# Patient Record
Sex: Female | Born: 1945
Health system: Southern US, Community
[De-identification: ages and names within clinical notes are randomized; demographics above are authoritative.]

## PROBLEM LIST (undated history)

## (undated) DIAGNOSIS — M21371 Foot drop, right foot: Secondary | ICD-10-CM

## (undated) DIAGNOSIS — F419 Anxiety disorder, unspecified: Secondary | ICD-10-CM

## (undated) DIAGNOSIS — R2689 Other abnormalities of gait and mobility: Secondary | ICD-10-CM

## (undated) DIAGNOSIS — E039 Hypothyroidism, unspecified: Secondary | ICD-10-CM

## (undated) DIAGNOSIS — J449 Chronic obstructive pulmonary disease, unspecified: Secondary | ICD-10-CM

## (undated) DIAGNOSIS — F32A Depression, unspecified: Secondary | ICD-10-CM

## (undated) DIAGNOSIS — G8929 Other chronic pain: Secondary | ICD-10-CM

## (undated) DIAGNOSIS — R296 Repeated falls: Secondary | ICD-10-CM

## (undated) DIAGNOSIS — M810 Age-related osteoporosis without current pathological fracture: Secondary | ICD-10-CM

## (undated) DIAGNOSIS — Z720 Tobacco use: Secondary | ICD-10-CM

## (undated) DIAGNOSIS — R0902 Hypoxemia: Secondary | ICD-10-CM

## (undated) DIAGNOSIS — M1990 Unspecified osteoarthritis, unspecified site: Secondary | ICD-10-CM

## (undated) DIAGNOSIS — Z79891 Long term (current) use of opiate analgesic: Secondary | ICD-10-CM

## (undated) DIAGNOSIS — R400 Somnolence: Secondary | ICD-10-CM

## (undated) DIAGNOSIS — I10 Essential (primary) hypertension: Secondary | ICD-10-CM

## (undated) DIAGNOSIS — R531 Weakness: Secondary | ICD-10-CM

## (undated) DIAGNOSIS — A498 Other bacterial infections of unspecified site: Secondary | ICD-10-CM

## (undated) DIAGNOSIS — Z221 Carrier of other intestinal infectious diseases: Secondary | ICD-10-CM

## (undated) DIAGNOSIS — I639 Cerebral infarction, unspecified: Secondary | ICD-10-CM

## (undated) DIAGNOSIS — R42 Dizziness and giddiness: Secondary | ICD-10-CM

## (undated) DIAGNOSIS — R0683 Snoring: Secondary | ICD-10-CM

## (undated) DIAGNOSIS — R51 Headache: Secondary | ICD-10-CM

## (undated) HISTORY — DX: Depression, unspecified: F32.A

## (undated) HISTORY — DX: Chronic obstructive pulmonary disease, unspecified: J44.9

## (undated) HISTORY — PX: JOINT REPLACEMENT: SHX530

## (undated) HISTORY — DX: Hypoxemia: R09.02

## (undated) HISTORY — DX: Anxiety disorder, unspecified: F41.9

## (undated) HISTORY — DX: Hypothyroidism, unspecified: E03.9

## (undated) HISTORY — DX: Other chronic pain: G89.29

## (undated) HISTORY — DX: Essential (primary) hypertension: I10

## (undated) HISTORY — DX: Unspecified osteoarthritis, unspecified site: M19.90

## (undated) HISTORY — DX: Age-related osteoporosis without current pathological fracture: M81.0

---

## 1980-07-18 HISTORY — PX: TUBAL LIGATION: SHX77

## 2002-06-28 ENCOUNTER — Emergency Department (HOSPITAL_COMMUNITY): Admission: EM | Admit: 2002-06-28 | Discharge: 2002-06-28 | Payer: Self-pay | Admitting: Emergency Medicine

## 2002-06-28 ENCOUNTER — Encounter: Payer: Self-pay | Admitting: *Deleted

## 2003-05-08 ENCOUNTER — Encounter: Payer: Self-pay | Admitting: Family Medicine

## 2003-05-08 ENCOUNTER — Ambulatory Visit (HOSPITAL_COMMUNITY): Admission: RE | Admit: 2003-05-08 | Discharge: 2003-05-08 | Payer: Self-pay | Admitting: Family Medicine

## 2004-01-08 ENCOUNTER — Emergency Department (HOSPITAL_COMMUNITY): Admission: EM | Admit: 2004-01-08 | Discharge: 2004-01-08 | Payer: Self-pay | Admitting: Emergency Medicine

## 2006-02-11 ENCOUNTER — Emergency Department (HOSPITAL_COMMUNITY): Admission: EM | Admit: 2006-02-11 | Discharge: 2006-02-11 | Payer: Self-pay | Admitting: Emergency Medicine

## 2006-02-24 ENCOUNTER — Emergency Department (HOSPITAL_COMMUNITY): Admission: EM | Admit: 2006-02-24 | Discharge: 2006-02-24 | Payer: Self-pay | Admitting: Emergency Medicine

## 2007-08-14 ENCOUNTER — Ambulatory Visit (HOSPITAL_COMMUNITY): Admission: RE | Admit: 2007-08-14 | Discharge: 2007-08-14 | Payer: Self-pay | Admitting: Family Medicine

## 2008-07-10 ENCOUNTER — Emergency Department (HOSPITAL_COMMUNITY): Admission: EM | Admit: 2008-07-10 | Discharge: 2008-07-10 | Payer: Self-pay | Admitting: Emergency Medicine

## 2008-12-05 ENCOUNTER — Ambulatory Visit (HOSPITAL_COMMUNITY): Admission: RE | Admit: 2008-12-05 | Discharge: 2008-12-05 | Payer: Self-pay | Admitting: Family Medicine

## 2009-04-13 ENCOUNTER — Ambulatory Visit (HOSPITAL_COMMUNITY): Admission: RE | Admit: 2009-04-13 | Discharge: 2009-04-13 | Payer: Self-pay | Admitting: Family Medicine

## 2009-04-22 ENCOUNTER — Ambulatory Visit (HOSPITAL_COMMUNITY): Admission: RE | Admit: 2009-04-22 | Discharge: 2009-04-22 | Payer: Self-pay | Admitting: Family Medicine

## 2009-05-07 ENCOUNTER — Ambulatory Visit (HOSPITAL_COMMUNITY): Admission: RE | Admit: 2009-05-07 | Discharge: 2009-05-07 | Payer: Self-pay | Admitting: Family Medicine

## 2009-07-18 HISTORY — PX: TOTAL KNEE ARTHROPLASTY: SHX125

## 2009-12-31 ENCOUNTER — Emergency Department (HOSPITAL_COMMUNITY): Admission: EM | Admit: 2009-12-31 | Discharge: 2009-12-31 | Payer: Self-pay | Admitting: Emergency Medicine

## 2010-04-14 ENCOUNTER — Ambulatory Visit (HOSPITAL_COMMUNITY): Admission: RE | Admit: 2010-04-14 | Discharge: 2010-04-14 | Payer: Self-pay | Admitting: Family Medicine

## 2010-08-07 ENCOUNTER — Encounter: Payer: Self-pay | Admitting: Family Medicine

## 2010-08-08 ENCOUNTER — Encounter: Payer: Self-pay | Admitting: Family Medicine

## 2010-11-02 ENCOUNTER — Ambulatory Visit (HOSPITAL_COMMUNITY)
Admission: RE | Admit: 2010-11-02 | Discharge: 2010-11-02 | Disposition: A | Discharge status: Home / Self Care | Payer: Medicare Other | Source: Ambulatory Visit | Attending: Orthopedic Surgery | Admitting: Orthopedic Surgery

## 2010-11-02 ENCOUNTER — Other Ambulatory Visit (HOSPITAL_COMMUNITY): Payer: Self-pay | Admitting: Orthopedic Surgery

## 2010-11-02 ENCOUNTER — Encounter (HOSPITAL_COMMUNITY)
Admission: RE | Admit: 2010-11-02 | Discharge: 2010-11-02 | Disposition: A | Discharge status: Home / Self Care | Payer: Medicare Other | Source: Ambulatory Visit | Attending: Orthopedic Surgery | Admitting: Orthopedic Surgery

## 2010-11-02 DIAGNOSIS — Z01812 Encounter for preprocedural laboratory examination: Secondary | ICD-10-CM | POA: Insufficient documentation

## 2010-11-02 DIAGNOSIS — F17200 Nicotine dependence, unspecified, uncomplicated: Secondary | ICD-10-CM | POA: Insufficient documentation

## 2010-11-02 DIAGNOSIS — I10 Essential (primary) hypertension: Secondary | ICD-10-CM | POA: Insufficient documentation

## 2010-11-02 DIAGNOSIS — M1711 Unilateral primary osteoarthritis, right knee: Secondary | ICD-10-CM

## 2010-11-02 DIAGNOSIS — Z01818 Encounter for other preprocedural examination: Secondary | ICD-10-CM | POA: Insufficient documentation

## 2010-11-02 DIAGNOSIS — Z01810 Encounter for preprocedural cardiovascular examination: Secondary | ICD-10-CM | POA: Insufficient documentation

## 2010-11-02 LAB — CBC
Hemoglobin: 15.4 g/dL — ABNORMAL HIGH (ref 12.0–15.0)
MCH: 29.4 pg (ref 26.0–34.0)
MCHC: 33.5 g/dL (ref 30.0–36.0)
Platelets: 275 10*3/uL (ref 150–400)
RBC: 5.23 MIL/uL — ABNORMAL HIGH (ref 3.87–5.11)

## 2010-11-02 LAB — COMPREHENSIVE METABOLIC PANEL
ALT: 21 U/L (ref 0–35)
AST: 20 U/L (ref 0–37)
Albumin: 4 g/dL (ref 3.5–5.2)
Chloride: 105 mEq/L (ref 96–112)
GFR calc Af Amer: >60 mL/min (ref >60)
GFR calc non Af Amer: >60 mL/min (ref >60)
Glucose, Bld: 98 mg/dL (ref 70–99)
Sodium: 139 mEq/L (ref 135–145)
Total Protein: 7.2 g/dL (ref 6.0–8.3)

## 2010-11-02 LAB — SURGICAL PCR SCREEN
MRSA, PCR: NEGATIVE
Staphylococcus aureus: NEGATIVE

## 2010-11-02 LAB — URINE MICROSCOPIC-ADD ON

## 2010-11-02 LAB — URINALYSIS, ROUTINE W REFLEX MICROSCOPIC
Glucose, UA: NEGATIVE mg/dL
Hgb urine dipstick: NEGATIVE
Ketones, ur: NEGATIVE mg/dL
Specific Gravity, Urine: 1.013 (ref 1.005–1.030)

## 2010-11-02 LAB — DIFFERENTIAL
Basophils Absolute: 0 10*3/uL (ref 0.0–0.1)
Basophils Relative: 0 % (ref 0–1)

## 2010-11-03 LAB — URINE CULTURE

## 2010-11-08 ENCOUNTER — Inpatient Hospital Stay (HOSPITAL_COMMUNITY)
Admission: RE | Admit: 2010-11-08 | Discharge: 2010-11-11 | DRG: 470 | Disposition: A | Discharge status: Skilled Nursing Facility | Payer: Medicare Other | Source: Ambulatory Visit | Attending: Orthopedic Surgery | Admitting: Orthopedic Surgery

## 2010-11-08 DIAGNOSIS — J449 Chronic obstructive pulmonary disease, unspecified: Secondary | ICD-10-CM | POA: Diagnosis present

## 2010-11-08 DIAGNOSIS — X58XXXA Exposure to other specified factors, initial encounter: Secondary | ICD-10-CM | POA: Diagnosis not present

## 2010-11-08 DIAGNOSIS — J4489 Other specified chronic obstructive pulmonary disease: Secondary | ICD-10-CM | POA: Diagnosis present

## 2010-11-08 DIAGNOSIS — M1710 Unilateral primary osteoarthritis, unspecified knee: Principal | ICD-10-CM | POA: Diagnosis present

## 2010-11-08 DIAGNOSIS — IMO0002 Reserved for concepts with insufficient information to code with codable children: Principal | ICD-10-CM | POA: Diagnosis present

## 2010-11-08 DIAGNOSIS — E079 Disorder of thyroid, unspecified: Secondary | ICD-10-CM | POA: Diagnosis present

## 2010-11-08 DIAGNOSIS — L259 Unspecified contact dermatitis, unspecified cause: Secondary | ICD-10-CM | POA: Diagnosis not present

## 2010-11-08 DIAGNOSIS — F17200 Nicotine dependence, unspecified, uncomplicated: Secondary | ICD-10-CM | POA: Diagnosis present

## 2010-11-08 DIAGNOSIS — D62 Acute posthemorrhagic anemia: Secondary | ICD-10-CM | POA: Diagnosis not present

## 2010-11-08 DIAGNOSIS — F411 Generalized anxiety disorder: Secondary | ICD-10-CM | POA: Diagnosis present

## 2010-11-08 DIAGNOSIS — K219 Gastro-esophageal reflux disease without esophagitis: Secondary | ICD-10-CM | POA: Diagnosis present

## 2010-11-08 LAB — TYPE AND SCREEN: Antibody Screen: NEGATIVE

## 2010-11-08 LAB — ABO/RH: ABO/RH(D): A NEG

## 2010-11-09 LAB — BASIC METABOLIC PANEL
CO2: 27 mEq/L (ref 19–32)
Calcium: 8.4 mg/dL (ref 8.4–10.5)
GFR calc Af Amer: >60 mL/min (ref >60)
GFR calc non Af Amer: >60 mL/min (ref >60)
Sodium: 138 mEq/L (ref 135–145)

## 2010-11-10 LAB — CBC
Hemoglobin: 10.2 g/dL — ABNORMAL LOW (ref 12.0–15.0)
RBC: 3.51 MIL/uL — ABNORMAL LOW (ref 3.87–5.11)
WBC: 9.1 10*3/uL (ref 4.0–10.5)

## 2010-11-10 LAB — BASIC METABOLIC PANEL
CO2: 27 mEq/L (ref 19–32)
Chloride: 103 mEq/L (ref 96–112)
GFR calc Af Amer: >60 mL/min (ref >60)
Sodium: 138 mEq/L (ref 135–145)

## 2010-11-11 LAB — BASIC METABOLIC PANEL
BUN: 4 mg/dL — ABNORMAL LOW (ref 6–23)
CO2: 25 mEq/L (ref 19–32)
Chloride: 105 mEq/L (ref 96–112)
Creatinine, Ser: 0.52 mg/dL (ref 0.4–1.2)
GFR calc Af Amer: >60 mL/min (ref >60)
Potassium: 3.7 mEq/L (ref 3.5–5.1)

## 2010-11-11 LAB — CBC
Hemoglobin: 10 g/dL — ABNORMAL LOW (ref 12.0–15.0)
MCH: 29.2 pg (ref 26.0–34.0)
MCV: 87.7 fL (ref 78.0–100.0)
RBC: 3.42 MIL/uL — ABNORMAL LOW (ref 3.87–5.11)
WBC: 7.9 10*3/uL (ref 4.0–10.5)

## 2010-11-16 ENCOUNTER — Other Ambulatory Visit (HOSPITAL_COMMUNITY): Payer: Self-pay

## 2010-12-07 NOTE — Op Note (Signed)
NAMENAKSHATRA, KLOSE              ACCOUNT NO.:  [id number]  MEDICAL RECORD NO.:  [id number]           PATIENT TYPE:  I  LOCATION:  5009                         FACILITY:  [facility]  PHYSICIAN:  Oda Lansdowne A. Thurston Hole, M.D. DATE OF BIRTH:  [birth year]  DATE OF PROCEDURE:  11/08/2010 DATE OF DISCHARGE:                              OPERATIVE REPORT   PREOPERATIVE DIAGNOSIS:  Right knee degenerative joint disease.  POSTOPERATIVE DIAGNOSIS:  Right knee degenerative joint disease.  PROCEDURE: 1. Right total knee replacement using DePuy cemented total knee system     with #2 cemented femur, #2 cemented tibia with 10 mm polyethylene     RP tibial spacer, and 32-mm polyethylene cemented patella. 2. Zinacef-impregnated cement.  SURGEON:  Elana Alm. Thurston Hole, MD  ASSISTANT:  Julien Girt, PA-C  ANESTHESIA:  General.  OPERATIVE TIME:  1 hour 20 minutes.  COMPLICATIONS:  None.  DESCRIPTION OF PROCEDURE:  Ms. Carrozza was brought to the operating room on November 08, 2010, after a femoral nerve block was placed in holding by Anesthesia.  She was placed on the operative table in supine position. After being placed under general anesthesia, her right knee was examined.  Range of motions from -5 to 125 degrees, moderate varus deformity, and a stable ligamentous exam with normal patellar tracking. She had a Foley catheter placed under sterile conditions.  She received Ancef 1 g IV preoperatively for prophylaxis.  Right leg was then prepped using sterile DuraPrep and draped using sterile technique.  Time-out procedure was called, and the correct right knee identified.  The right leg was exsanguinated, and a thigh tourniquet elevated 365 mm. Initially through a 12-cm longitudinal incision based over the patella, initial exposure was made.  The underlying subcutaneous tissues were incised along with the skin incision.  A median arthrotomy was performed revealing an excessive amount of normal-appearing  joint fluid.  The articular surfaces were inspected.  She had grade 4 changes medially, laterally, and in the patellofemoral joint.  Osteophytes were removed from the femoral condyles and tibial plateau.  The medial and lateral meniscal remnants were removed as well as the anterior cruciate ligament.  Intramedullary drill was then drilled down the femoral canal for placement of the distal femoral cutting jig, which was placed in the appropriate manner of rotation.  A distal 11-mm cut was made.  The distal femur was incised.  #2 was found be appropriate size.  #2 cutting jig was placed in the appropriate manner of external rotation and then these cuts were made.  The proximal tibia was then exposed.  The tibial spines were removed with an oscillating saw.  Intramedullary drill was drilled down the tibial canal for placement of proximal tibial cutting jig, which was placed in the appropriate manner of rotation, and a proximal 4-mm cut was made based off the medial or lower side.  Spacer blocks were then placed in flexion extension.  10-mm blocks gave excellent balancing, excellent stability, and excellent correction of her flexion and varus deformities.  At this point, the #2 tibial baseplate trial was placed on the cut tibial surface with an excellent fit and the  keel cut was made.  The PCL box cutter was then placed on the distal femur, and these cuts were made.  At this point, the #2 femoral trial was placed with a #2 tibial baseplate trial and a 10-mm polyethylene RP tibial spacer, knee was reduced, taken through range of motion from 0-125 degrees with excellent stability, and excellent correction of her flexion and varus deformities and normal patellar tracking.  At this point, a resurfacing 8.5-mm cut was made on the patella and three locking holes placed for a 32-mm polyethylene patellar trial.  Again, patellofemoral tracking was evaluated and found to be normal.  At this point, it  was felt that all the components were of excellent size, fit, and stability.  They were then removed.  The knee was then jet lavaged, irrigated with 3 liters of saline.  Proximal tibia was then exposed, and #2 tibial component with Zinacef-impregnated cement backing was hammered into position with an excellent fit with excess cement being removed around the edges.  #2 femoral component with cement backing was hammered into position also with an excellent fit with excess cement being removed from around the edges.  The 10-mm polyethylene RP tibial spacer was placed on the tibial baseplate.  The knee reduced, taken through range of motion from 0-125 degrees with excellent stability and excellent correction of her flexion and varus deformities.  The 32-mm polyethylene cement backed patella was then placed in its position and held there with a clamp.  After the cement hardened again, patellofemoral tracking was evaluated and found to be normal.  At this point, it was felt that all the components were of excellent size, fit, and stability.  The wound was further irrigated with saline and then the tourniquet was released.  Hemostasis obtained with cautery.  The arthrotomy was then closed with #1 Ethibond suture over two medium Hemovac drains.  Subcutaneous tissues were closed 0 and 2-0 Vicryl, subcuticular layer closed with 4-0 Monocryl.  Sterile dressings and a long leg splint applied.  The patient then awakened, extubated, and taken to recovery room in stable condition.  Needle and sponge counts were correct x2 at the end of the case.  Neurovascular status normal.  Pulses 2+ and symmetric.     Sarath Privott A. Thurston Hole, M.D.     RAW/MEDQ  D:  11/08/2010  T:  11/09/2010  Job:  [id number]  Electronically Signed by Salvatore Marvel M.D. on 12/07/2010 05:20:10 PM

## 2010-12-07 NOTE — Discharge Summary (Signed)
Amanda Armstrong, Amanda Armstrong              ACCOUNT NO.:  [id number]  MEDICAL RECORD NO.:  [id number]           PATIENT TYPE:  I  LOCATION:  5009                         FACILITY:  [facility]  PHYSICIAN:  Taariq Leitz A. Thurston Hole, M.D. DATE OF BIRTH:  [birth year]  DATE OF ADMISSION:  11/08/2010 DATE OF DISCHARGE:  11/11/2010                        DISCHARGE SUMMARY - REFERRING   ADMITTING DIAGNOSES: 1. End-stage DJD, right knee. 2. Thyroid disease. 3. Tobacco abuse. 4. Anxiety.  DISCHARGE DIAGNOSES: 1. End-stage DJD, right knee. 2. Acute blood loss anemia. 3. Contact dermatitis from her TED hose. 4. Full-thickness skin blister from adhesive tape. 5. Thyroid disease. 6. Tobacco abuse. 7. Anxiety  HISTORY OF PRESENT ILLNESS:  The patient is a 65 year old white female with a history of end-stage DJD of her right knee.  She failed conservative care including anti-inflammatories and interarticular cortisone injections.  Risks, benefits and possible complications of a right total knee replacement had been discussed in detail with the patient.  She is without question.  PROCEDURES IN-HOUSE:  On November 08, 2010, the patient underwent a right total knee replacement by Dr. Thurston Hole.  A right femoral nerve block by anesthesia and Autovac transfusion.  She tolerated all of these procedures well.  HOSPITAL COURSE:  Postoperatively, the patient was placed in her CPM 0- 90 degrees.  In the recovery room, she also received her Autovac transfusions in the recovery room.  She tolerated this well.  Postop day #1, she was metabolically stable.  Surgical wound was well approximated. She was very slow to progress with physical therapy, pivoting from the bed to the chair.  She was given milk of magnesia, fluid boluses.  Her dressing was changed.  Her IV was saline locked.  Postop day #2, the patient continued to progress with a max walking of 15 feet.  She fatigued easily, leg gave way regularly.  Hemoglobin is  stable at 10.2. She is metabolically stable within normal limits.  She is afebrile. Blood pressure was stable at 127/72.  Postop day #3, the patient is still very, very slow to progress.  She ideally would like to be discharged to home.  However, for her safety, we do feel it is medically necessary for her to be discharged to a skilled facility.  She gets still 1+ to 2 assist with transfers and still ambulating approximately 15 feet.  She had a bowel movement yesterday.  Surgical wound is well approximated.  She does have Tegaderm onto proximal tape blisters on her thigh, and she did have some contact dermatitis from her TED hose.  This will need to be watched closely while she is at skilled nursing.  She will need physical therapy daily, occupational therapy.  She will need to use her CPM 0-90 degrees 6 hours a day.  She will need to have her right heel elevated on a folded pillow for 30 minutes every day to work on extension of her knee.  She needs her dressing changed every day. She needs to have her right knee washed with soap and water every single day, rinsed thoroughly.  Please do not use any waterless soap but make sure use  regular soap and water and wash her whole leg thoroughly every single day.  A dry dressing needs to be applied to the wound.  Please call our office [phone] to make a followup appointment for Nov 22, 2010. Call our office with increased pain, increased redness, increased swelling or temperature greater than 101.  DISCHARGE MEDICATIONS: 1. Colace 100 mg 1 tablet twice a day. 2. Lovenox 30 mg 1 injection subcu twice a day until Nov 22, 2010. 3. Hydrocodone 10/325 one to two tablets every 4 hours as needed for     pain. 4. Protonix 40 mg 1 tablet daily. 5. Xanax 1 mg by mouth at night. 6. Levothyroxine 100 mcg 1 tablet daily.  We have currently stopped her home medication of hydrocodone 5 and change this out for hydrocodone 10 due to her postoperative pain.   She does not tolerate oxycodone due to mental status changes.  She does not tolerate Sudafed also due to mental status changes.  She will follow up in our office on Nov 22, 2010, till than she is on a regular diet, weightbearing as tolerated.     Amanda Shepperson, PA-C   ______________________________ Elana Alm Thurston Hole, M.D.    KS/MEDQ  D:  11/11/2010  T:  11/11/2010  Job:  [id number]  Electronically Signed by Julien Girt P.A. on 12/01/2010 09:53:11 AM Electronically Signed by Salvatore Marvel M.D. on 12/07/2010 05:20:30 PM

## 2011-06-06 ENCOUNTER — Other Ambulatory Visit (HOSPITAL_COMMUNITY): Payer: Self-pay | Admitting: Family Medicine

## 2011-06-06 DIAGNOSIS — Z139 Encounter for screening, unspecified: Secondary | ICD-10-CM

## 2011-06-06 DIAGNOSIS — G8929 Other chronic pain: Secondary | ICD-10-CM

## 2011-06-10 ENCOUNTER — Ambulatory Visit (HOSPITAL_COMMUNITY)
Admission: RE | Admit: 2011-06-10 | Discharge: 2011-06-10 | Disposition: A | Discharge status: Home / Self Care | Payer: Medicare Other | Source: Ambulatory Visit | Attending: Family Medicine | Admitting: Family Medicine

## 2011-06-10 DIAGNOSIS — M818 Other osteoporosis without current pathological fracture: Secondary | ICD-10-CM | POA: Insufficient documentation

## 2011-06-10 DIAGNOSIS — Z1231 Encounter for screening mammogram for malignant neoplasm of breast: Secondary | ICD-10-CM | POA: Insufficient documentation

## 2011-06-10 DIAGNOSIS — G8929 Other chronic pain: Secondary | ICD-10-CM

## 2011-06-10 DIAGNOSIS — Z780 Asymptomatic menopausal state: Secondary | ICD-10-CM | POA: Insufficient documentation

## 2011-06-10 DIAGNOSIS — Z139 Encounter for screening, unspecified: Secondary | ICD-10-CM

## 2011-10-24 ENCOUNTER — Encounter (HOSPITAL_COMMUNITY): Payer: Self-pay | Admitting: Pharmacy Technician

## 2011-10-25 ENCOUNTER — Encounter: Payer: Self-pay | Admitting: Physician Assistant

## 2011-10-25 ENCOUNTER — Other Ambulatory Visit: Payer: Self-pay | Admitting: Physician Assistant

## 2011-10-25 DIAGNOSIS — M1990 Unspecified osteoarthritis, unspecified site: Secondary | ICD-10-CM | POA: Insufficient documentation

## 2011-10-25 DIAGNOSIS — E039 Hypothyroidism, unspecified: Secondary | ICD-10-CM

## 2011-10-25 DIAGNOSIS — F419 Anxiety disorder, unspecified: Secondary | ICD-10-CM

## 2011-10-25 NOTE — H&P (Signed)
Amanda Armstrong is an 66 y.o. female.   Chief Complaint: left knee DJD HPI: Amanda Armstrong is a 66 year old seen for follow-up from her right total knee replacement which is 9 months post-op doing well. She has significant persistent left knee pain with known degenerative joint disease. She has pain with weightbearing and activity relieved by rest. The excruciating pain is getting progressively worse, limiting activities of daily living, poses a significant fall risk, and has failed multiple conservative treatments including intraarticular cortisone injections, intraarticular Supartz, bracing, medication and home physical therapy.  Past Medical History  Diagnosis Date  . Hypothyroidism   . Anxiety   . Arthritis     Bilateral Knee DJD    Past Surgical History  Procedure Date  . Joint replacement 11/08/3010    right total knee  . Tubal ligation 1982    Family History  Problem Relation Age of Onset  . Heart attack Mother   . COPD Father   . Arthritis Sister   . Heart disease Brother    Social History:  reports that she has been smoking.  She does not have any smokeless tobacco history on file. She reports that she does not drink alcohol or use illicit drugs.  Allergies: No Known Allergies  Medications Prior to Admission  Medication Sig Dispense Refill  . ALPRAZolam (XANAX) 1 MG tablet Take 1 mg by mouth 3 (three) times daily as needed. For anxiety      . levothyroxine (SYNTHROID, LEVOTHROID) 100 MCG tablet Take 100 mcg by mouth daily.      . naproxen sodium (ANAPROX) 220 MG tablet Take 440 mg by mouth 2 (two) times daily with a meal.      . Fexofenadine HCl (EQ ALLERGY RELIEF PO) Take 1 tablet by mouth 2 (two) times daily as needed. For sinuses       No current facility-administered medications on file as of 10/25/2011.    No results found for this or any previous visit (from the past 48 hour(s)). No results found.  Review of Systems  Constitutional: Negative for fever, chills,  weight loss, malaise/fatigue and diaphoresis.  HENT: Negative for hearing loss, ear pain, nosebleeds, congestion, sore throat, neck pain, tinnitus and ear discharge.   Eyes: Negative for blurred vision, double vision, photophobia, pain, discharge and redness.  Respiratory: Negative for cough, hemoptysis, sputum production, shortness of breath, wheezing and stridor.   Cardiovascular: Negative for chest pain, palpitations, orthopnea, claudication, leg swelling and PND.  Gastrointestinal: Negative for heartburn, nausea, vomiting, abdominal pain, diarrhea, constipation, blood in stool and melena.  Genitourinary: Negative for dysuria, urgency, frequency, hematuria and flank pain.  Musculoskeletal: Positive for joint pain. Negative for myalgias, back pain and falls.       Left knee pain  Skin: Negative for itching and rash.  Neurological: Negative for dizziness, tingling, tremors, sensory change, speech change, focal weakness, seizures, loss of consciousness, weakness and headaches.  Endo/Heme/Allergies: Negative for environmental allergies and polydipsia. Does not bruise/bleed easily.  Psychiatric/Behavioral: Negative for depression, suicidal ideas, hallucinations, memory loss and substance abuse. The patient is nervous/anxious. The patient does not have insomnia.     Blood pressure 136/76, pulse 76, temperature 97.9 F (36.6 C), height 4' 11" (1.499 m), weight 60.328 kg (133 lb), SpO2 95.00%. Physical Exam  Constitutional: She is oriented to person, place, and time. She appears well-developed and well-nourished.  HENT:  Head: Normocephalic and atraumatic.  Mouth/Throat: Oropharynx is clear and moist.  Eyes: Conjunctivae are normal. Pupils   are equal, round, and reactive to light.  Neck: Normal range of motion. Neck supple.  Cardiovascular: Normal rate, regular rhythm and normal heart sounds.   Respiratory: Effort normal and breath sounds normal.  GI: Bowel sounds are normal.  Genitourinary:        Not pertinent to current symptomatology therefore not examined.  Musculoskeletal:       Examination of her right knee reveals well healed total knee incision without swelling or pain range of motion 0-120 degrees knee is stable with normal patella tracking. Exam of her left knee reveals 1 to 2+ crepitation 1+ synovitis range of motion 0-120 degrees knee is stable with normal patella tracking. Vascular exam: pulses 2+ and symmetric.  Neurological: She is alert and oriented to person, place, and time.  Skin: Skin is warm and dry.  Psychiatric: She has a normal mood and affect. Her behavior is normal. Judgment and thought content normal.     Assessment Past Medical History  Diagnosis Date  . Hypothyroidism   . Anxiety   . Arthritis     Bilateral Knee DJD    Plan I talk to her and her husband about this in detail. She wants to proceed with left total knee replacement. Discussed risks benefits and possible complications of the surgery in detail and she understands this completely.   Amanda Armstrong J 10/25/2011, 3:39 PM    

## 2011-11-01 ENCOUNTER — Encounter (HOSPITAL_COMMUNITY): Payer: Self-pay

## 2011-11-01 ENCOUNTER — Encounter (HOSPITAL_COMMUNITY)
Admission: RE | Admit: 2011-11-01 | Discharge: 2011-11-01 | Disposition: A | Discharge status: Home / Self Care | Payer: Medicare Other | Source: Ambulatory Visit | Attending: Orthopedic Surgery | Admitting: Orthopedic Surgery

## 2011-11-01 ENCOUNTER — Encounter (HOSPITAL_COMMUNITY)
Admission: RE | Admit: 2011-11-01 | Discharge: 2011-11-01 | Disposition: A | Discharge status: Home / Self Care | Payer: Medicare Other | Source: Ambulatory Visit | Attending: Physician Assistant | Admitting: Physician Assistant

## 2011-11-01 HISTORY — DX: Headache: R51

## 2011-11-01 LAB — COMPREHENSIVE METABOLIC PANEL
ALT: 10 U/L (ref 0–35)
CO2: 28 mEq/L (ref 19–32)
Calcium: 9.2 mg/dL (ref 8.4–10.5)
Creatinine, Ser: 0.63 mg/dL (ref 0.50–1.10)
GFR calc Af Amer: >90 mL/min (ref >90)
GFR calc non Af Amer: >90 mL/min (ref >90)
Glucose, Bld: 101 mg/dL — ABNORMAL HIGH (ref 70–99)
Sodium: 141 mEq/L (ref 135–145)
Total Protein: 7.1 g/dL (ref 6.0–8.3)

## 2011-11-01 LAB — PROTIME-INR
INR: 1.05 (ref 0.00–1.49)
Prothrombin Time: 13.9 seconds (ref 11.6–15.2)

## 2011-11-01 LAB — CBC
HCT: 44.4 % (ref 36.0–46.0)
MCH: 30.8 pg (ref 26.0–34.0)
MCHC: 34 g/dL (ref 30.0–36.0)
MCV: 90.4 fL (ref 78.0–100.0)
Platelets: 253 10*3/uL (ref 150–400)
RBC: 4.91 MIL/uL (ref 3.87–5.11)
WBC: 8.6 10*3/uL (ref 4.0–10.5)

## 2011-11-01 LAB — DIFFERENTIAL
Basophils Absolute: 0.1 10*3/uL (ref 0.0–0.1)
Eosinophils Relative: 1 % (ref 0–5)
Lymphocytes Relative: 33 % (ref 12–46)
Lymphs Abs: 2.8 10*3/uL (ref 0.7–4.0)
Neutro Abs: 5 10*3/uL (ref 1.7–7.7)

## 2011-11-01 LAB — TYPE AND SCREEN
ABO/RH(D): A NEG
Antibody Screen: NEGATIVE

## 2011-11-01 LAB — URINALYSIS, ROUTINE W REFLEX MICROSCOPIC
Bilirubin Urine: NEGATIVE
Hgb urine dipstick: NEGATIVE
Ketones, ur: NEGATIVE mg/dL
Nitrite: NEGATIVE
Protein, ur: NEGATIVE mg/dL
Urobilinogen, UA: 1 mg/dL (ref 0.0–1.0)

## 2011-11-01 LAB — URINE MICROSCOPIC-ADD ON

## 2011-11-01 LAB — APTT: aPTT: 50 seconds — ABNORMAL HIGH (ref 24–37)

## 2011-11-01 LAB — SURGICAL PCR SCREEN
MRSA, PCR: NEGATIVE
Staphylococcus aureus: NEGATIVE

## 2011-11-01 MED ORDER — CHLORHEXIDINE GLUCONATE 4 % EX LIQD: 60.0000 mL | Freq: Once | CUTANEOUS | Status: DC

## 2011-11-01 NOTE — Pre-Procedure Instructions (Signed)
20 Amanda Armstrong  11/01/2011   Your procedure is scheduled on:  November 07, 2011  Report to [facility] at 7:00 AM.  Call this number if you have problems the morning of surgery: [phone]   Remember:   Do not eat food:After Midnight.  May have clear liquids: up to 4 Hours before arrival.  Clear liquids include soda, tea, black coffee, apple or grape juice, broth.  Take these medicines the morning of surgery with A SIP OF WATER: XANAX,SYNTHROID   Do not wear jewelry, make-up or nail polish.  Do not wear lotions, powders, or perfumes. You may wear deodorant.  Do not shave 48 hours prior to surgery.  Do not bring valuables to the hospital.  Contacts, dentures or bridgework may not be worn into surgery.  Leave suitcase in the car. After surgery it may be brought to your room.  For patients admitted to the hospital, checkout time is 11:00 AM the day of discharge.   Patients discharged the day of surgery will not be allowed to drive home.  Name and phone number of your driver: NA  Special Instructions: Incentive Spirometry - Practice and bring it with you on the day of surgery. and CHG Shower Use Special Wash: 1/2 bottle night before surgery and 1/2 bottle morning of surgery.   Please read over the following fact sheets that you were given: Pain Booklet, Blood Transfusion Information, Total Joint Packet, MRSA Information and Surgical Site Infection Prevention

## 2011-11-02 LAB — URINE CULTURE

## 2011-11-06 MED ORDER — CEFAZOLIN SODIUM 1-5 GM-% IV SOLN
1.0000 g | INTRAVENOUS | Status: AC
  Administered 2011-11-07: 1 g via INTRAVENOUS
  Filled 2011-11-06: qty 50

## 2011-11-06 MED ORDER — LACTATED RINGERS IV SOLN
INTRAVENOUS | Status: DC
  Administered 2011-11-07 (×2): via INTRAVENOUS

## 2011-11-06 MED ORDER — POVIDONE-IODINE 7.5 % EX SOLN
Freq: Once | CUTANEOUS | Status: DC
  Filled 2011-11-06: qty 118

## 2011-11-07 ENCOUNTER — Encounter (HOSPITAL_COMMUNITY): Payer: Self-pay | Admitting: Anesthesiology

## 2011-11-07 ENCOUNTER — Encounter (HOSPITAL_COMMUNITY): Payer: Self-pay | Admitting: Surgery

## 2011-11-07 ENCOUNTER — Encounter (HOSPITAL_COMMUNITY): Payer: Self-pay | Admitting: Vascular Surgery

## 2011-11-07 ENCOUNTER — Inpatient Hospital Stay (HOSPITAL_COMMUNITY)
Admission: RE | Admit: 2011-11-07 | Discharge: 2011-11-10 | DRG: 470 | Disposition: A | Discharge status: Skilled Nursing Facility | Payer: Medicare Other | Source: Ambulatory Visit | Attending: Orthopedic Surgery | Admitting: Orthopedic Surgery

## 2011-11-07 ENCOUNTER — Ambulatory Visit (HOSPITAL_COMMUNITY): Payer: Medicare Other | Admitting: Anesthesiology

## 2011-11-07 ENCOUNTER — Encounter (HOSPITAL_COMMUNITY): Payer: Self-pay | Admitting: *Deleted

## 2011-11-07 ENCOUNTER — Encounter (HOSPITAL_COMMUNITY)
Admission: RE | Disposition: A | Discharge status: Skilled Nursing Facility | Payer: Self-pay | Source: Ambulatory Visit | Attending: Orthopedic Surgery

## 2011-11-07 DIAGNOSIS — F411 Generalized anxiety disorder: Secondary | ICD-10-CM | POA: Diagnosis present

## 2011-11-07 DIAGNOSIS — J449 Chronic obstructive pulmonary disease, unspecified: Secondary | ICD-10-CM | POA: Diagnosis present

## 2011-11-07 DIAGNOSIS — Z96659 Presence of unspecified artificial knee joint: Secondary | ICD-10-CM

## 2011-11-07 DIAGNOSIS — F419 Anxiety disorder, unspecified: Secondary | ICD-10-CM | POA: Diagnosis present

## 2011-11-07 DIAGNOSIS — M1990 Unspecified osteoarthritis, unspecified site: Secondary | ICD-10-CM | POA: Diagnosis present

## 2011-11-07 DIAGNOSIS — M1710 Unilateral primary osteoarthritis, unspecified knee: Principal | ICD-10-CM | POA: Diagnosis present

## 2011-11-07 DIAGNOSIS — E039 Hypothyroidism, unspecified: Secondary | ICD-10-CM | POA: Diagnosis present

## 2011-11-07 DIAGNOSIS — Z01811 Encounter for preprocedural respiratory examination: Secondary | ICD-10-CM

## 2011-11-07 DIAGNOSIS — Z79899 Other long term (current) drug therapy: Secondary | ICD-10-CM

## 2011-11-07 DIAGNOSIS — J4489 Other specified chronic obstructive pulmonary disease: Secondary | ICD-10-CM | POA: Diagnosis present

## 2011-11-07 DIAGNOSIS — Z01810 Encounter for preprocedural cardiovascular examination: Secondary | ICD-10-CM

## 2011-11-07 DIAGNOSIS — Z01812 Encounter for preprocedural laboratory examination: Secondary | ICD-10-CM

## 2011-11-07 DIAGNOSIS — F17200 Nicotine dependence, unspecified, uncomplicated: Secondary | ICD-10-CM | POA: Diagnosis present

## 2011-11-07 DIAGNOSIS — Z01818 Encounter for other preprocedural examination: Secondary | ICD-10-CM

## 2011-11-07 HISTORY — PX: TOTAL KNEE ARTHROPLASTY: SHX125

## 2011-11-07 SURGERY — ARTHROPLASTY, KNEE, TOTAL
Anesthesia: General | Site: Knee | Laterality: Left | Wound class: Clean

## 2011-11-07 MED ORDER — ONDANSETRON HCL 4 MG/2ML IJ SOLN: 4.0000 mg | Freq: Four times a day (QID) | INTRAMUSCULAR | Status: DC | PRN

## 2011-11-07 MED ORDER — ONDANSETRON HCL 4 MG/2ML IJ SOLN
INTRAMUSCULAR | Status: DC | PRN
  Administered 2011-11-07: 4 mg via INTRAVENOUS

## 2011-11-07 MED ORDER — FEXOFENADINE HCL 180 MG PO TABS
180.0000 mg | ORAL_TABLET | Freq: Every day | ORAL | Status: DC
  Administered 2011-11-07 – 2011-11-10 (×4): 180 mg via ORAL
  Filled 2011-11-07 (×4): qty 1

## 2011-11-07 MED ORDER — HYDROCODONE-ACETAMINOPHEN 10-325 MG PO TABS
1.0000 | ORAL_TABLET | ORAL | Status: DC | PRN
  Administered 2011-11-07 – 2011-11-09 (×4): 2 via ORAL
  Administered 2011-11-09: 1 via ORAL
  Administered 2011-11-09 – 2011-11-10 (×2): 2 via ORAL
  Filled 2011-11-07 (×4): qty 2
  Filled 2011-11-07: qty 1
  Filled 2011-11-07 (×3): qty 2
  Filled 2011-11-07: qty 1
  Filled 2011-11-07: qty 2

## 2011-11-07 MED ORDER — CEFAZOLIN SODIUM 1-5 GM-% IV SOLN
1.0000 g | Freq: Four times a day (QID) | INTRAVENOUS | Status: AC
  Administered 2011-11-07 – 2011-11-08 (×3): 1 g via INTRAVENOUS
  Filled 2011-11-07 (×3): qty 50

## 2011-11-07 MED ORDER — NEOSTIGMINE METHYLSULFATE 1 MG/ML IJ SOLN
INTRAMUSCULAR | Status: DC | PRN
  Administered 2011-11-07: 2 mg via INTRAVENOUS
  Administered 2011-11-07: 1 mg via INTRAVENOUS

## 2011-11-07 MED ORDER — LEVOTHYROXINE SODIUM 100 MCG PO TABS
100.0000 ug | ORAL_TABLET | Freq: Every day | ORAL | Status: DC
  Administered 2011-11-08 – 2011-11-10 (×3): 100 ug via ORAL
  Filled 2011-11-07 (×4): qty 1

## 2011-11-07 MED ORDER — HYDROMORPHONE HCL PF 1 MG/ML IJ SOLN
0.5000 mg | INTRAMUSCULAR | Status: DC | PRN
  Administered 2011-11-07 – 2011-11-08 (×2): 1 mg via INTRAVENOUS
  Filled 2011-11-07 (×2): qty 1

## 2011-11-07 MED ORDER — CELECOXIB 200 MG PO CAPS
200.0000 mg | ORAL_CAPSULE | Freq: Two times a day (BID) | ORAL | Status: DC
  Administered 2011-11-07 – 2011-11-10 (×6): 200 mg via ORAL
  Filled 2011-11-07 (×7): qty 1

## 2011-11-07 MED ORDER — CEFUROXIME SODIUM 1.5 G IJ SOLR
INTRAMUSCULAR | Status: DC | PRN
  Administered 2011-11-07: 1.5 g

## 2011-11-07 MED ORDER — MENTHOL 3 MG MT LOZG: 1.0000 | LOZENGE | OROMUCOSAL | Status: DC | PRN

## 2011-11-07 MED ORDER — LIDOCAINE HCL (CARDIAC) 20 MG/ML IV SOLN
INTRAVENOUS | Status: DC | PRN
  Administered 2011-11-07: 40 mg via INTRAVENOUS

## 2011-11-07 MED ORDER — FENTANYL CITRATE 0.05 MG/ML IJ SOLN
50.0000 ug | Freq: Once | INTRAMUSCULAR | Status: AC
  Administered 2011-11-07: 50 ug via INTRAVENOUS

## 2011-11-07 MED ORDER — ALBUTEROL SULFATE HFA 108 (90 BASE) MCG/ACT IN AERS
INHALATION_SPRAY | RESPIRATORY_TRACT | Status: DC | PRN
  Administered 2011-11-07 (×2): 2 via RESPIRATORY_TRACT

## 2011-11-07 MED ORDER — BUPIVACAINE-EPINEPHRINE PF 0.5-1:200000 % IJ SOLN
INTRAMUSCULAR | Status: DC | PRN
  Administered 2011-11-07: 20 mL

## 2011-11-07 MED ORDER — POTASSIUM CHLORIDE IN NACL 20-0.9 MEQ/L-% IV SOLN
INTRAVENOUS | Status: DC
  Administered 2011-11-07 – 2011-11-08 (×2): via INTRAVENOUS
  Filled 2011-11-07 (×9): qty 1000

## 2011-11-07 MED ORDER — METOCLOPRAMIDE HCL 5 MG/ML IJ SOLN: 5.0000 mg | Freq: Three times a day (TID) | INTRAMUSCULAR | Status: DC | PRN

## 2011-11-07 MED ORDER — PROPOFOL 10 MG/ML IV BOLUS
INTRAVENOUS | Status: DC | PRN
  Administered 2011-11-07: 120 mg via INTRAVENOUS

## 2011-11-07 MED ORDER — FENTANYL CITRATE 0.05 MG/ML IJ SOLN
INTRAMUSCULAR | Status: AC
  Filled 2011-11-07: qty 2

## 2011-11-07 MED ORDER — DEXAMETHASONE SODIUM PHOSPHATE 4 MG/ML IJ SOLN
INTRAMUSCULAR | Status: DC | PRN
  Administered 2011-11-07: 4 mg via INTRAVENOUS

## 2011-11-07 MED ORDER — SODIUM CHLORIDE 0.9 % IR SOLN
Status: DC | PRN
  Administered 2011-11-07: 3000 mL
  Administered 2011-11-07: 1000 mL

## 2011-11-07 MED ORDER — ALPRAZOLAM 0.5 MG PO TABS
1.0000 mg | ORAL_TABLET | Freq: Three times a day (TID) | ORAL | Status: DC | PRN
  Administered 2011-11-07 – 2011-11-08 (×3): 1 mg via ORAL
  Filled 2011-11-07: qty 1
  Filled 2011-11-07 (×2): qty 2
  Filled 2011-11-07: qty 1

## 2011-11-07 MED ORDER — DOCUSATE SODIUM 100 MG PO CAPS
100.0000 mg | ORAL_CAPSULE | Freq: Two times a day (BID) | ORAL | Status: DC
  Administered 2011-11-07 – 2011-11-10 (×6): 100 mg via ORAL
  Filled 2011-11-07 (×8): qty 1

## 2011-11-07 MED ORDER — METOCLOPRAMIDE HCL 10 MG PO TABS: 5.0000 mg | ORAL_TABLET | Freq: Three times a day (TID) | ORAL | Status: DC | PRN

## 2011-11-07 MED ORDER — GLYCOPYRROLATE 0.2 MG/ML IJ SOLN
INTRAMUSCULAR | Status: DC | PRN
  Administered 2011-11-07 (×2): .2 mg via INTRAVENOUS

## 2011-11-07 MED ORDER — ENOXAPARIN SODIUM 30 MG/0.3ML ~~LOC~~ SOLN
30.0000 mg | Freq: Two times a day (BID) | SUBCUTANEOUS | Status: DC
Start: 2011-11-08 — End: 2011-11-10
  Administered 2011-11-08 – 2011-11-10 (×5): 30 mg via SUBCUTANEOUS
  Filled 2011-11-07 (×8): qty 0.3

## 2011-11-07 MED ORDER — PHENOL 1.4 % MT LIQD: 1.0000 | OROMUCOSAL | Status: DC | PRN

## 2011-11-07 MED ORDER — ONDANSETRON HCL 4 MG PO TABS: 4.0000 mg | ORAL_TABLET | Freq: Four times a day (QID) | ORAL | Status: DC | PRN

## 2011-11-07 MED ORDER — VECURONIUM BROMIDE 10 MG IV SOLR
INTRAVENOUS | Status: DC | PRN
  Administered 2011-11-07: 4 mg via INTRAVENOUS

## 2011-11-07 MED ORDER — ACETAMINOPHEN 10 MG/ML IV SOLN
INTRAVENOUS | Status: AC
  Filled 2011-11-07: qty 100

## 2011-11-07 MED ORDER — ACETAMINOPHEN 650 MG RE SUPP: 650.0000 mg | Freq: Four times a day (QID) | RECTAL | Status: DC | PRN

## 2011-11-07 MED ORDER — PHENYLEPHRINE HCL 10 MG/ML IJ SOLN
INTRAMUSCULAR | Status: DC | PRN
  Administered 2011-11-07: 40 ug via INTRAVENOUS
  Administered 2011-11-07: 80 ug via INTRAVENOUS

## 2011-11-07 MED ORDER — ACETAMINOPHEN 10 MG/ML IV SOLN
INTRAVENOUS | Status: DC | PRN
  Administered 2011-11-07: 1000 mg via INTRAVENOUS

## 2011-11-07 MED ORDER — ONDANSETRON HCL 4 MG/2ML IJ SOLN: 4.0000 mg | Freq: Once | INTRAMUSCULAR | Status: DC | PRN

## 2011-11-07 MED ORDER — FENTANYL CITRATE 0.05 MG/ML IJ SOLN
INTRAMUSCULAR | Status: DC | PRN
  Administered 2011-11-07: 150 ug via INTRAVENOUS
  Administered 2011-11-07: 50 ug via INTRAVENOUS

## 2011-11-07 MED ORDER — SORBITOL 70 % SOLN
30.0000 mL | Freq: Two times a day (BID) | Status: DC
  Administered 2011-11-07 – 2011-11-09 (×5): 30 mL via ORAL
  Filled 2011-11-07 (×7): qty 30

## 2011-11-07 MED ORDER — HYDROMORPHONE HCL PF 1 MG/ML IJ SOLN
0.2500 mg | INTRAMUSCULAR | Status: DC | PRN
  Administered 2011-11-07 (×2): 0.5 mg via INTRAVENOUS

## 2011-11-07 MED ORDER — ACETAMINOPHEN 325 MG PO TABS
650.0000 mg | ORAL_TABLET | Freq: Four times a day (QID) | ORAL | Status: DC | PRN
  Administered 2011-11-07: 650 mg via ORAL
  Filled 2011-11-07: qty 2

## 2011-11-07 SURGICAL SUPPLY — 67 items
BANDAGE ELASTIC 6 VELCRO ST LF (GAUZE/BANDAGES/DRESSINGS) ×2 IMPLANT
BANDAGE ESMARK 6X9 LF (GAUZE/BANDAGES/DRESSINGS) ×1 IMPLANT
BLADE SAGITTAL 25.0X1.19X90 (BLADE) ×2 IMPLANT
BLADE SAW SGTL 11.0X1.19X90.0M (BLADE) IMPLANT
BLADE SAW SGTL 13.0X1.19X90.0M (BLADE) ×2 IMPLANT
BLADE SURG 10 STRL SS (BLADE) ×4 IMPLANT
BNDG ELASTIC 6X15 VLCR STRL LF (GAUZE/BANDAGES/DRESSINGS) ×2 IMPLANT
BNDG ESMARK 6X9 LF (GAUZE/BANDAGES/DRESSINGS) ×2
BOWL SMART MIX CTS (DISPOSABLE) ×2 IMPLANT
CEMENT HV SMART SET (Cement) ×4 IMPLANT
CLOTH BEACON ORANGE TIMEOUT ST (SAFETY) ×2 IMPLANT
COVER BACK TABLE 24X17X13 BIG (DRAPES) IMPLANT
COVER PROBE W GEL 5X96 (DRAPES) ×2 IMPLANT
COVER SURGICAL LIGHT HANDLE (MISCELLANEOUS) ×2 IMPLANT
CUFF TOURNIQUET SINGLE 34IN LL (TOURNIQUET CUFF) ×2 IMPLANT
CUFF TOURNIQUET SINGLE 44IN (TOURNIQUET CUFF) IMPLANT
DRAPE EXTREMITY T 121X128X90 (DRAPE) ×2 IMPLANT
DRAPE INCISE IOBAN 66X45 STRL (DRAPES) ×2 IMPLANT
DRAPE PROXIMA HALF (DRAPES) ×2 IMPLANT
DRAPE U-SHAPE 47X51 STRL (DRAPES) ×2 IMPLANT
DRSG ADAPTIC 3X8 NADH LF (GAUZE/BANDAGES/DRESSINGS) ×2 IMPLANT
DRSG PAD ABDOMINAL 8X10 ST (GAUZE/BANDAGES/DRESSINGS) ×4 IMPLANT
DURAPREP 26ML APPLICATOR (WOUND CARE) ×2 IMPLANT
ELECT CAUTERY BLADE 6.4 (BLADE) ×2 IMPLANT
ELECT REM PT RETURN 9FT ADLT (ELECTROSURGICAL) ×2
ELECTRODE REM PT RTRN 9FT ADLT (ELECTROSURGICAL) ×1 IMPLANT
EVACUATOR 1/8 PVC DRAIN (DRAIN) ×2 IMPLANT
FACESHIELD LNG OPTICON STERILE (SAFETY) ×2 IMPLANT
GLOVE BIO SURGEON STRL SZ7 (GLOVE) ×2 IMPLANT
GLOVE BIOGEL PI IND STRL 7.0 (GLOVE) ×1 IMPLANT
GLOVE BIOGEL PI IND STRL 7.5 (GLOVE) ×3 IMPLANT
GLOVE BIOGEL PI INDICATOR 7.0 (GLOVE) ×1
GLOVE BIOGEL PI INDICATOR 7.5 (GLOVE) ×3
GLOVE SS BIOGEL STRL SZ 7.5 (GLOVE) ×1 IMPLANT
GLOVE SUPERSENSE BIOGEL SZ 7.5 (GLOVE) ×1
GOWN PREVENTION PLUS XLARGE (GOWN DISPOSABLE) ×4 IMPLANT
GOWN STRL NON-REIN LRG LVL3 (GOWN DISPOSABLE) ×4 IMPLANT
HANDPIECE INTERPULSE COAX TIP (DISPOSABLE) ×2
HOOD PEEL AWAY FACE SHEILD DIS (HOOD) ×4 IMPLANT
IMMOBILIZER KNEE 22 UNIV (SOFTGOODS) ×4 IMPLANT
INSERT CUSHION PRONEVIEW LG (MISCELLANEOUS) ×2 IMPLANT
KIT BASIN OR (CUSTOM PROCEDURE TRAY) ×2 IMPLANT
KIT ROOM TURNOVER OR (KITS) ×2 IMPLANT
MANIFOLD NEPTUNE II (INSTRUMENTS) ×2 IMPLANT
NS IRRIG 1000ML POUR BTL (IV SOLUTION) ×2 IMPLANT
PACK TOTAL JOINT (CUSTOM PROCEDURE TRAY) ×2 IMPLANT
PAD ARMBOARD 7.5X6 YLW CONV (MISCELLANEOUS) ×4 IMPLANT
PAD CAST 4YDX4 CTTN HI CHSV (CAST SUPPLIES) ×1 IMPLANT
PADDING CAST COTTON 4X4 STRL (CAST SUPPLIES) ×2
PADDING CAST COTTON 6X4 STRL (CAST SUPPLIES) ×2 IMPLANT
POSITIONER HEAD PRONE TRACH (MISCELLANEOUS) ×2 IMPLANT
RUBBERBAND STERILE (MISCELLANEOUS) ×2 IMPLANT
SET HNDPC FAN SPRY TIP SCT (DISPOSABLE) ×1 IMPLANT
SPONGE GAUZE 4X4 12PLY (GAUZE/BANDAGES/DRESSINGS) ×2 IMPLANT
STRIP CLOSURE SKIN 1/2X4 (GAUZE/BANDAGES/DRESSINGS) ×2 IMPLANT
SUCTION FRAZIER TIP 10 FR DISP (SUCTIONS) ×2 IMPLANT
SUT MNCRL AB 3-0 PS2 18 (SUTURE) ×2 IMPLANT
SUT VIC AB 0 CT1 27 (SUTURE) ×4
SUT VIC AB 0 CT1 27XBRD ANBCTR (SUTURE) ×2 IMPLANT
SUT VIC AB 2-0 CT1 27 (SUTURE) ×2
SUT VIC AB 2-0 CT1 TAPERPNT 27 (SUTURE) ×2 IMPLANT
SUT VLOC 180 0 24IN GS25 (SUTURE) ×2 IMPLANT
SYR 30ML SLIP (SYRINGE) ×2 IMPLANT
TOWEL OR 17X24 6PK STRL BLUE (TOWEL DISPOSABLE) ×2 IMPLANT
TOWEL OR 17X26 10 PK STRL BLUE (TOWEL DISPOSABLE) ×2 IMPLANT
TRAY FOLEY CATH 14FR (SET/KITS/TRAYS/PACK) ×2 IMPLANT
WATER STERILE IRR 1000ML POUR (IV SOLUTION) ×6 IMPLANT

## 2011-11-07 NOTE — Interval H&P Note (Signed)
History and Physical Interval Note:  11/07/2011 9:10 AM  Amanda Armstrong  has presented today for surgery, with the diagnosis of DJD LEFT KNEE  The various methods of treatment have been discussed with the patient and family. After consideration of risks, benefits and other options for treatment, the patient has consented to  Procedure(s) (LRB): TOTAL KNEE ARTHROPLASTY (Left) as a surgical intervention .  The patients' history has been reviewed, patient examined, no change in status, stable for surgery.  I have reviewed the patients' chart and labs.  Questions were answered to the patient's satisfaction.     Salvatore Marvel A

## 2011-11-07 NOTE — H&P (View-Only) (Signed)
Amanda Armstrong is an 66 y.o. female.   Chief Complaint: left knee DJD HPI: Amanda Armstrong is a 66 year old seen for follow-up from her right total knee replacement which is 9 months post-op doing well. She has significant persistent left knee pain with known degenerative joint disease. She has pain with weightbearing and activity relieved by rest. The excruciating pain is getting progressively worse, limiting activities of daily living, poses a significant fall risk, and has failed multiple conservative treatments including intraarticular cortisone injections, intraarticular Supartz, bracing, medication and home physical therapy.  Past Medical History  Diagnosis Date  . Hypothyroidism   . Anxiety   . Arthritis     Bilateral Knee DJD    Past Surgical History  Procedure Date  . Joint replacement 11/08/3010    right total knee  . Tubal ligation 1982    Family History  Problem Relation Age of Onset  . Heart attack Mother   . COPD Father   . Arthritis Sister   . Heart disease Brother    Social History:  reports that she has been smoking.  She does not have any smokeless tobacco history on file. She reports that she does not drink alcohol or use illicit drugs.  Allergies: No Known Allergies  Medications Prior to Admission  Medication Sig Dispense Refill  . ALPRAZolam (XANAX) 1 MG tablet Take 1 mg by mouth 3 (three) times daily as needed. For anxiety      . levothyroxine (SYNTHROID, LEVOTHROID) 100 MCG tablet Take 100 mcg by mouth daily.      . naproxen sodium (ANAPROX) 220 MG tablet Take 440 mg by mouth 2 (two) times daily with a meal.      . Fexofenadine HCl (EQ ALLERGY RELIEF PO) Take 1 tablet by mouth 2 (two) times daily as needed. For sinuses       No current facility-administered medications on file as of 10/25/2011.    No results found for this or any previous visit (from the past 48 hour(s)). No results found.  Review of Systems  Constitutional: Negative for fever, chills,  weight loss, malaise/fatigue and diaphoresis.  HENT: Negative for hearing loss, ear pain, nosebleeds, congestion, sore throat, neck pain, tinnitus and ear discharge.   Eyes: Negative for blurred vision, double vision, photophobia, pain, discharge and redness.  Respiratory: Negative for cough, hemoptysis, sputum production, shortness of breath, wheezing and stridor.   Cardiovascular: Negative for chest pain, palpitations, orthopnea, claudication, leg swelling and PND.  Gastrointestinal: Negative for heartburn, nausea, vomiting, abdominal pain, diarrhea, constipation, blood in stool and melena.  Genitourinary: Negative for dysuria, urgency, frequency, hematuria and flank pain.  Musculoskeletal: Positive for joint pain. Negative for myalgias, back pain and falls.       Left knee pain  Skin: Negative for itching and rash.  Neurological: Negative for dizziness, tingling, tremors, sensory change, speech change, focal weakness, seizures, loss of consciousness, weakness and headaches.  Endo/Heme/Allergies: Negative for environmental allergies and polydipsia. Does not bruise/bleed easily.  Psychiatric/Behavioral: Negative for depression, suicidal ideas, hallucinations, memory loss and substance abuse. The patient is nervous/anxious. The patient does not have insomnia.     Blood pressure 136/76, pulse 76, temperature 97.9 F (36.6 C), height 4\' 11"  (1.499 m), weight 60.328 kg (133 lb), SpO2 95.00%. Physical Exam  Constitutional: She is oriented to person, place, and time. She appears well-developed and well-nourished.  HENT:  Head: Normocephalic and atraumatic.  Mouth/Throat: Oropharynx is clear and moist.  Eyes: Conjunctivae are normal. Pupils  are equal, round, and reactive to light.  Neck: Normal range of motion. Neck supple.  Cardiovascular: Normal rate, regular rhythm and normal heart sounds.   Respiratory: Effort normal and breath sounds normal.  GI: Bowel sounds are normal.  Genitourinary:        Not pertinent to current symptomatology therefore not examined.  Musculoskeletal:       Examination of her right knee reveals well healed total knee incision without swelling or pain range of motion 0-120 degrees knee is stable with normal patella tracking. Exam of her left knee reveals 1 to 2+ crepitation 1+ synovitis range of motion 0-120 degrees knee is stable with normal patella tracking. Vascular exam: pulses 2+ and symmetric.  Neurological: She is alert and oriented to person, place, and time.  Skin: Skin is warm and dry.  Psychiatric: She has a normal mood and affect. Her behavior is normal. Judgment and thought content normal.     Assessment Past Medical History  Diagnosis Date  . Hypothyroidism   . Anxiety   . Arthritis     Bilateral Knee DJD    Plan I talk to her and her husband about this in detail. She wants to proceed with left total knee replacement. Discussed risks benefits and possible complications of the surgery in detail and she understands this completely.   Amanda Armstrong J 10/25/2011, 3:39 PM

## 2011-11-07 NOTE — Anesthesia Postprocedure Evaluation (Signed)
  Anesthesia Post-op Note  Patient: Amanda Armstrong  Procedure(s) Performed: Procedure(s) (LRB): TOTAL KNEE ARTHROPLASTY (Left)  Patient Location: PACU  Anesthesia Type: GA combined with regional for post-op pain  Level of Consciousness: awake, oriented, sedated and patient cooperative  Airway and Oxygen Therapy: Patient Spontanous Breathing and Patient connected to nasal cannula oxygen  Post-op Pain: mild  Post-op Assessment: Post-op Vital signs reviewed, Patient's Cardiovascular Status Stable, Respiratory Function Stable, Patent Airway, No signs of Nausea or vomiting and Pain level controlled  Post-op Vital Signs: stable  Complications: No apparent anesthesia complications

## 2011-11-07 NOTE — Brief Op Note (Signed)
11/07/2011  10:39 AM  PATIENT:  Amanda Armstrong  66 y.o. female  PRE-OPERATIVE DIAGNOSIS:  DJD LEFT KNEE  POST-OPERATIVE DIAGNOSIS:  DJD LEFT KNEE  PROCEDURE:  Procedure(s) (LRB): TOTAL KNEE ARTHROPLASTY (Left)  SURGEON:  Surgeon(s) and Role:    * Nilda Simmer, MD - Primary  PHYSICIAN ASSISTANT: [name] PA-C  ASSISTANTS: [name] PA-C   ANESTHESIA:   general  EBL:  Total I/O In: 1000 [I.V.:1000] Out: 150 [Urine:100; Blood:50]  BLOOD ADMINISTERED:none  DRAINS: (2) Hemovact drain(s) in the left knee with  Suction Clamped   LOCAL MEDICATIONS USED:  NONE  SPECIMEN:  No Specimen  DISPOSITION OF SPECIMEN:  N/A  COUNTS:  YES  TOURNIQUET:   Total Tourniquet Time Documented: Thigh (Left) - 55 minutes  DICTATION: .Note written in EPIC  PLAN OF CARE: Admit to inpatient   PATIENT DISPOSITION:  PACU - hemodynamically stable.   Delay start of Pharmacological VTE agent (>24hrs) due to surgical blood loss or risk of bleeding: no

## 2011-11-07 NOTE — Progress Notes (Signed)
Orthopedic Tech Progress Note Patient Details:  Amanda Armstrong [birth year] [id number]  CPM Left Knee CPM Left Knee: On Left Knee Flexion (Degrees): 90  Left Knee Extension (Degrees): 0  Patient in CPM at 1210  Trishia Cuthrell T 11/07/2011, 1:13 PM

## 2011-11-07 NOTE — Anesthesia Procedure Notes (Signed)
Anesthesia Regional Block:  Femoral nerve block  Pre-Anesthetic Checklist: ,, timeout performed, Correct Patient, Correct Site, Correct Laterality, Correct Procedure, Correct Position, site marked, Risks and benefits discussed,  Surgical consent,  Pre-op evaluation,  At surgeon's request and post-op pain management  Laterality: Left  Prep: Maximum Sterile Barrier Precautions used and chloraprep       Needles:  Injection technique: Single-shot  Needle Type: Stimulator Needle - 80        Needle insertion depth: 5 cm   Additional Needles:  Procedures: nerve stimulator Femoral nerve block  Nerve Stimulator or Paresthesia:  Response: 0.5 mA, 0.1 ms, 5 cm  Additional Responses:   Narrative:  Start time: 11/07/2011 8:35 AM End time: 11/07/2011 8:42 AM Injection made incrementally with aspirations every 5 mL.  Performed by: Personally  Anesthesiologist: Maren Beach MD   Additional Notes: 20cc 0.5% Marcaine w/ epi w/o difficulty or discomfort  GES

## 2011-11-07 NOTE — Transfer of Care (Signed)
Immediate Anesthesia Transfer of Care Note  Patient: Amanda Armstrong  Procedure(s) Performed: Procedure(s) (LRB): TOTAL KNEE ARTHROPLASTY (Left)  Patient Location: PACU  Anesthesia Type: General  Level of Consciousness: awake, alert  and oriented  Airway & Oxygen Therapy: Patient Spontanous Breathing and Patient connected to face mask oxygen  Post-op Assessment: Report given to PACU RN, Post -op Vital signs reviewed and stable and Patient moving all extremities X 4  Post vital signs: Reviewed and stable  Complications: No apparent anesthesia complications

## 2011-11-07 NOTE — Plan of Care (Signed)
Problem: Consults Goal: Diagnosis- Total Joint Replacement Outcome: Completed/Met Date Met:  11/07/11 Primary Total Knee Left     

## 2011-11-07 NOTE — Progress Notes (Signed)
SBP in the 90's. Pt easily arousable, needs to be awakened to ask about pain.  Dr Katrinka Blazing aware. No new orders, OK to transport to floor.

## 2011-11-07 NOTE — Preoperative (Signed)
Beta Blockers   Reason not to administer Beta Blockers:Not Applicable 

## 2011-11-07 NOTE — Anesthesia Preprocedure Evaluation (Addendum)
Anesthesia Evaluation  Patient identified by MRN, date of birth, ID band Patient awake    Reviewed: Allergy & Precautions, H&P , NPO status , Patient's Chart, lab work & pertinent test results  Airway Mallampati: I TM Distance: >3 FB Neck ROM: full    Dental   Pulmonary COPD COPD inhaler, Current Smoker,          Cardiovascular Rhythm:regular Rate:Normal     Neuro/Psych  Headaches,    GI/Hepatic   Endo/Other    Renal/GU      Musculoskeletal   Abdominal   Peds  Hematology   Anesthesia Other Findings   Reproductive/Obstetrics                          Anesthesia Physical Anesthesia Plan  ASA: II  Anesthesia Plan: General   Post-op Pain Management:    Induction: Intravenous  Airway Management Planned: LMA and Oral ETT  Additional Equipment:   Intra-op Plan:   Post-operative Plan: Extubation in OR  Informed Consent: I have reviewed the patients History and Physical, chart, labs and discussed the procedure including the risks, benefits and alternatives for the proposed anesthesia with the patient or authorized representative who has indicated his/her understanding and acceptance.     Plan Discussed with: CRNA, Anesthesiologist and Surgeon  Anesthesia Plan Comments:         Anesthesia Quick Evaluation

## 2011-11-07 NOTE — Op Note (Signed)
NAMEJEORGIA, HELMING              ACCOUNT NO.:  [id number]  MEDICAL RECORD NO.:  [id number]  LOCATION:  5007                         FACILITY:  [facility]  PHYSICIAN:  Avanelle Pixley A. Thurston Hole, M.D. DATE OF BIRTH:  Mar 25, 1946  DATE OF PROCEDURE:  11/07/2011 DATE OF DISCHARGE:                              OPERATIVE REPORT   PREOPERATIVE DIAGNOSIS:  Left knee degenerative joint disease.  POSTOPERATIVE DIAGNOSIS:  Left knee degenerative joint disease.  PROCEDURE: 1. Left total knee replacement using DePuy cemented total knee system     with number 2 cemented femur. 2. Cemented tibia with 10 mm polyethylene RP tibial spacer, and 32 mm     polyethylene cemented patella. 3. Zinacef impregnated cement.  SURGEON:  Elana Alm. Thurston Hole, M.D.  ASSISTANT:  Julien Girt, PA-C  ANESTHESIA:  General.  OPERATIVE TIME:  1 hour.  COMPLICATIONS:  None.  DESCRIPTION OF PROCEDURE:  Ms. Veltri was brought to the operating room on November 07, 2011, after femoral nerve block was placed in holding by anesthesia.  She was placed on the operating table in supine position. After being placed under general anesthesia, her left knee was examined. She had range of motion from -5 to 125 degrees mild varus deformity, knee stable, ligamentous exam with normal patellar tracking.  She had a Foley catheter placed under sterile conditions.  Left leg was then prepped using sterile DuraPrep and draped using sterile technique.  Time- out procedure was called and the correct left knee identified.  Left leg was then exsanguinated and a thigh tourniquet elevated to 365 mm. Initially, through a 15 cm longitudinal incision based over the patella, initial exposure was made.  The underlying subcutaneous tissues were incised along with skin incision.  A median arthrotomy was performed revealing an excessive amount of normal-appearing joint fluid.  The articular surfaces were inspected.  She had grade 4 changes medially, grade 3  changes laterally, and grade 3 and 4 changes in the patellofemoral joint.  Osteophytes were removed from the femoral condyles and tibial plateau.  The medial and lateral meniscal remnants were removed as well as the anterior cruciate ligament.  Intramedullary drill was then drilled up to femoral canal for placement of distal femoral cutting jig, which was placed in the appropriate manner rotation and a distal 10 mm cut was made.  The distal femur was incised.  Number 2 was found be the appropriate size.  Number 2 cutting jig was placed in the appropriate manner of external rotation and then these cuts were made.  The proximal tibia was then exposed.  Tibial spines were removed with an oscillating saw.  Intramedullary drill was drilled down the tibial canal for placement of proximal tibial cutting jig, which was placed in appropriate manner of rotation and a proximal 4 mm cut was made based off the medial or lower side.  Spacer blocks were then placed in flexion and extension.  10 mm blocks gave excellent balancing, excellent stability, and excellent correction of her flexion and varus deformities.  At this point, the number 2 tibial base plate trial was placed on the cut tibial surface with an excellent fit and the keel cut was made.  The PCL box  cutter was then placed on the distal femur and these cuts were made.  At this point, the number 2 femoral trial was placed, and with the number 2 tibial base plate trial and a 10 mm polyethylene RP tibial spacer, knee was reduced, taken through range of motion from 0-125 degrees with excellent stability and excellent correction of her flexion and varus deformities and normal patellar tracking.  A resurfacing 8 mm cut was then made on the patella and 3 locking holes placed for a 32 mm polyethylene patellar trial and again, patellofemoral tracking was evaluated and found to be normal.  At this point, it was felt that all the trial components were of  excellent size, fit, and stability.  They were then removed.  The knee was then jet lavage, irrigated with 3 L of saline.  The proximal tibia was then exposed and the number 2 tibial base plate with Zinacef impregnated cement backing was hammered in position with an excellent fit with excess cement being removed from around the edges.  Number 2 femoral component with cement backing was hammered into position also with an excellent fit with excess cement being removed from around the edges. The 10 mm polyethylene RP tibial spacer was placed on tibial baseplate, knee reduced, taken through range of motion from 0-125 degrees with excellent stability and excellent correction of her flexion and varus deformities.  The 32 mm polyethylene cement backed patella was then placed in its position and held there with a clamp.  After the cement hardened, again patellofemoral tracking was evaluated and found to be normal.  At this point, it was felt that all the components were of excellent size, fit, and stability.  The wound was further irrigated with saline.  Tourniquet was released and hemostasis obtained with cautery.  The arthrotomy was then closed with running V-Loc number 1 suture.  Subcutaneous tissues closed with 0 and 2-0 Vicryl, subcuticular layer closed with 4-0 Monocryl.  Sterile dressings were applied and long- leg splint, and then the patient was awakened, extubated, and taken to recovery room in stable condition.  Needle, sponge counts correct x2 at the end the case.  Neurovascular status normal.  Pulses 2+ and symmetric.     Maelle Sheaffer A. Thurston Hole, M.D.     RAW/MEDQ  D:  11/07/2011  T:  11/07/2011  Job:  [id number]

## 2011-11-08 ENCOUNTER — Encounter (HOSPITAL_COMMUNITY): Payer: Self-pay | Admitting: Orthopedic Surgery

## 2011-11-08 LAB — URINE CULTURE: Culture: NO GROWTH

## 2011-11-08 LAB — BASIC METABOLIC PANEL
BUN: 9 mg/dL (ref 6–23)
Chloride: 107 mEq/L (ref 96–112)
GFR calc Af Amer: >90 mL/min (ref >90)
Potassium: 4.8 mEq/L (ref 3.5–5.1)
Sodium: 137 mEq/L (ref 135–145)

## 2011-11-08 LAB — CBC
HCT: 30.7 % — ABNORMAL LOW (ref 36.0–46.0)
Platelets: 227 10*3/uL (ref 150–400)
RDW: 13.7 % (ref 11.5–15.5)
WBC: 10.6 10*3/uL — ABNORMAL HIGH (ref 4.0–10.5)

## 2011-11-08 MED ORDER — SODIUM CHLORIDE 0.9 % IV BOLUS (SEPSIS)
500.0000 mL | Freq: Once | INTRAVENOUS | Status: AC
  Administered 2011-11-08: 500 mL via INTRAVENOUS

## 2011-11-08 NOTE — Progress Notes (Signed)
Agree with updated d/c plan.  11/08/2011 Cephus Shelling, PT, DPT [phone]

## 2011-11-08 NOTE — Progress Notes (Signed)
Physical Therapy Evaluation Patient Details Name: MAIMUNA LEAMAN MRN: [id number] DOB: [birth year] Today's Date: 11/08/2011 Time: 7829-5621 PT Time Calculation (min): 32 min  PT Assessment / Plan / Recommendation Clinical Impression  Pt is a 66 y/o female admitted s/p left TKA along with the below PT problem list.  Pt would benefit from acute PT to maximize independence and facilitate d/c home with HHPT.    PT Assessment  Patient needs continued PT services    Follow Up Recommendations  Home health PT    Equipment Recommendations  None recommended by PT    Frequency 7X/week    Precautions / Restrictions Precautions Precautions: Knee Precaution Booklet Issued: No Required Braces or Orthoses: Knee Immobilizer - Left Knee Immobilizer - Left: Discontinue post op day 2 Restrictions Weight Bearing Restrictions: Yes LLE Weight Bearing: Weight bearing as tolerated    Pertinent Vitals/Pain 4/10 in left knee.  Pt premedicated and repositioned.      Mobility  Bed Mobility Bed Mobility: Supine to Sit Supine to Sit: 4: Min assist;HOB flat Details for Bed Mobility Assistance: Assist for left knee with cues for sequence. Transfers Transfers: Sit to Stand;Stand to Sit Sit to Stand: 1: +2 Total assist;With upper extremity assist;From bed Sit to Stand: Patient Percentage: 60% Stand to Sit: 3: Mod assist;With upper extremity assist;To chair/3-in-1 Details for Transfer Assistance: Assist due to strong posterior and left lean with left knee buckling.  Cues for sequence and safest hand/left LE placement. Ambulation/Gait Ambulation/Gait Assistance: 3: Mod assist Ambulation Distance (Feet): 20 Feet Assistive device: Rolling walker Ambulation/Gait Assistance Details: Assist for balance and to off weight left LE due to buckling knee with cues for sequence and tall/midline posture. Gait Pattern: Decreased step length - left;Decreased stance time - left;Trunk flexed Stairs: No Wheelchair  Mobility Wheelchair Mobility: No    Exercises Total Joint Exercises Ankle Circles/Pumps: AROM;Left;5 reps;Supine Quad Sets: AROM;Left;10 reps;Supine Heel Slides: AAROM;Left;10 reps;Supine   PT Goals Acute Rehab PT Goals PT Goal Formulation: With patient Time For Goal Achievement: 11/15/11 Potential to Achieve Goals: Good Pt will go Supine/Side to Sit: with modified independence PT Goal: Supine/Side to Sit - Progress: Goal set today Pt will go Sit to Supine/Side: with modified independence PT Goal: Sit to Supine/Side - Progress: Goal set today Pt will go Sit to Stand: with modified independence PT Goal: Sit to Stand - Progress: Goal set today Pt will go Stand to Sit: with modified independence PT Goal: Stand to Sit - Progress: Goal set today Pt will Ambulate: >150 feet;with modified independence;with least restrictive assistive device PT Goal: Ambulate - Progress: Goal set today Pt will Go Up / Down Stairs: 1-2 stairs;with supervision;with least restrictive assistive device PT Goal: Up/Down Stairs - Progress: Goal set today Pt will Perform Home Exercise Program: Independently PT Goal: Perform Home Exercise Program - Progress: Goal set today  Visit Information  Last PT Received On: 11/08/11 Assistance Needed: +1    Subjective Data  Subjective: "We can try." Patient Stated Goal: Get well.   Prior Functioning  Home Living Lives With: Spouse Available Help at Discharge: Family Type of Home: House Home Access: Stairs to enter Secretary/administrator of Steps: 1 Entrance Stairs-Rails: None Home Layout: One level Bathroom Shower/Tub: Forensic scientist: Standard Home Adaptive Equipment: Straight cane;Walker - rolling;Shower chair without back Prior Function Level of Independence: Independent Able to Take Stairs?: Yes Driving: Yes Vocation: Retired Musician: No difficulties Dominant Hand: Right    Cognition  Overall Cognitive  Status: Appears  within functional limits for tasks assessed/performed Arousal/Alertness: Lethargic Orientation Level: Oriented X4 / Intact Behavior During Session: [facility] for tasks performed    Extremity/Trunk Assessment Right Upper Extremity Assessment RUE ROM/Strength/Tone: Within functional levels RUE Sensation: WFL - Light Touch RUE Coordination: WFL - gross/fine motor Left Upper Extremity Assessment LUE ROM/Strength/Tone: Within functional levels LUE Sensation: WFL - Light Touch LUE Coordination: WFL - gross/fine motor Right Lower Extremity Assessment RLE ROM/Strength/Tone: Within functional levels RLE Sensation: WFL - Light Touch RLE Coordination: WFL - gross/fine motor Left Lower Extremity Assessment LLE ROM/Strength/Tone: Deficits;Due to pain LLE ROM/Strength/Tone Deficits: 3/5 due to surgery.  AA/ROM left knee 0-55 degrees. LLE Sensation: WFL - Light Touch LLE Coordination: WFL - gross/fine motor Trunk Assessment Trunk Assessment: Normal   Balance Balance Balance Assessed: No  End of Session PT - End of Session Equipment Utilized During Treatment: Gait belt;Left knee immobilizer Activity Tolerance: Patient tolerated treatment well;Treatment limited secondary to medication (Causing lethargy.) Patient left: in chair;with call bell/phone within reach Nurse Communication: Mobility status;Weight bearing status CPM Left Knee CPM Left Knee: Off   Cephus Shelling 11/08/2011, 10:51 AM  11/08/2011 Cephus Shelling, PT, DPT [phone]

## 2011-11-08 NOTE — Progress Notes (Signed)
PT Progress Note:     11/08/11 1300  PT Visit Information  Last PT Received On 11/08/11  Assistance Needed +1  PT Time Calculation  PT Start Time 1330  PT Stop Time 1401  PT Time Calculation (min) 31 min  Subjective Data  Subjective Am I going to wear this (KI) at rehab?"  Precautions  Precautions Knee  Required Braces or Orthoses Knee Immobilizer - Left  Knee Immobilizer - Left Discontinue post op day 2  Restrictions  LLE Weight Bearing WBAT  Cognition  Overall Cognitive Status Appears within functional limits for tasks assessed/performed  Arousal/Alertness Awake/alert  Orientation Level Oriented X4 / Intact  Behavior During Session [facility] for tasks performed  Bed Mobility  Bed Mobility Sit to Supine  Sit to Supine 4: Min guard;HOB flat  Details for Bed Mobility Assistance Guarding for safety; no physical (A) needed.   Transfers  Transfers Sit to Stand;Stand to Sit  Sit to Stand 3: Mod assist;With armrests;From chair/3-in-1;With upper extremity assist  Sit to Stand: Patient Percentage 70%  Stand to Sit 3: Mod assist;With upper extremity assist;With armrests;To chair/3-in-1;To bed  Details for Transfer Assistance (A) for balance, controlled descent, & safety.  Cues for body positioning before sitting & hand placement. LLE continues to buckle.    Ambulation/Gait  Ambulation/Gait Assistance 3: Mod assist  Ambulation Distance (Feet) 10 Feet (2x's)  Assistive device Rolling walker  Ambulation/Gait Assistance Details (A) for balance, off weight LLE due to knee buckling despite KI being on.  Cues for sequencing, use of UE"s to offset weight on Lt when advancing Rt.    Gait Pattern Step-through pattern;Decreased stance time - left  Stairs No  Engineering geologist No  Balance  Balance Assessed No  Exercises  Exercises Total Joint  Total Joint Exercises  Ankle Circles/Pumps AROM;10 reps;Both;Supine  Quad Sets AROM;Strengthening;Left;10 reps;Supine  Straight  Leg Raises AAROM;Strengthening;Left;10 reps;Supine  PT - End of Session  Equipment Utilized During Treatment Gait belt;Left knee immobilizer  Activity Tolerance Patient tolerated treatment well  Patient left in bed;in CPM;with call bell/phone within reach  PT - Assessment/Plan  Comments on Treatment Session Pt more alert this PM.  Progressing with PT goals.  No posterior lean noted with standing, but continues to have significant knee buckling LLE despite wearing KI.  Pt placed on CPM at end of session (0-65 degrees).  Pt reports plans are for her to d/c to [facility] rehab prior to d/c home & that this was arranged prior to surgery.      PT Plan Discharge plan needs to be updated  PT Frequency 7X/week  Follow Up Recommendations Skilled nursing facility  Equipment Recommended Defer to next venue  Acute Rehab PT Goals  PT Goal: Sit to Supine/Side - Progress Progressing toward goal  PT Goal: Sit to Stand - Progress Progressing toward goal  PT Goal: Stand to Sit - Progress Not met  PT Goal: Ambulate - Progress Progressing toward goal  PT Goal: Perform Home Exercise Program - Progress Progressing toward goal    Pain level:  7/10 Lt knee at end of session     Verdell Face, PTA [phone] 11/08/2011

## 2011-11-08 NOTE — Progress Notes (Signed)
UR COMPLETED  

## 2011-11-09 LAB — CBC
HCT: 29.6 % — ABNORMAL LOW (ref 36.0–46.0)
Hemoglobin: 9.8 g/dL — ABNORMAL LOW (ref 12.0–15.0)
RBC: 3.27 MIL/uL — ABNORMAL LOW (ref 3.87–5.11)
WBC: 7.3 10*3/uL (ref 4.0–10.5)

## 2011-11-09 LAB — BASIC METABOLIC PANEL
BUN: 9 mg/dL (ref 6–23)
CO2: 25 mEq/L (ref 19–32)
Chloride: 109 mEq/L (ref 96–112)
Glucose, Bld: 100 mg/dL — ABNORMAL HIGH (ref 70–99)
Potassium: 3.7 mEq/L (ref 3.5–5.1)
Sodium: 141 mEq/L (ref 135–145)

## 2011-11-09 NOTE — Progress Notes (Addendum)
Clinical Social Work Department CLINICAL SOCIAL WORK PLACEMENT NOTE 11/09/2011  Patient:  Amanda Armstrong, Amanda Armstrong  Account Number:  [id number] Admit date:  11/07/2011  Clinical Social Worker:  Peggyann Shoals  Date/time:  11/09/2011 10:30 AM  Clinical Social Work is seeking post-discharge placement for this patient at the following level of care:   SKILLED NURSING   (*CSW will update this form in Epic as items are completed)   11/09/2011  Patient/family provided with [facility] Department of Clinical Social Work's list of facilities offering this level of care within the geographic area requested by the patient (or if unable, by the patient's family).  11/09/2011  Patient/family informed of their freedom to choose among providers that offer the needed level of care, that participate in Medicare, Medicaid or managed care program needed by the patient, have an available bed and are willing to accept the patient.  11/09/2011  Patient/family informed of [facility]' ownership interest in [facility], as well as of the fact that they are under no obligation to receive care at this facility.  PASARR submitted to [employer] on 11/09/2011 PASARR number received from [employer] on 11/09/2011  FL2 transmitted to all facilities in geographic area requested by pt/family on  11/09/2011 FL2 transmitted to all facilities within larger geographic area on   Patient informed that his/her managed care company has contracts with or will negotiate with  certain facilities, including the following:     Patient/family informed of bed offers received:  11/09/2011 Patient chooses bed at [facility] [place] Physician recommends and patient chooses bed at  [facility] [place]  Patient to be transferred to [facility] [place] on  11/10/2011 Patient to be transferred to facility by [facility]  The following physician request were entered in Epic:   Additional Comments: Pt has an existing PASARR#. Pt has arranged  with  [facility] and at has been confirmed.  Dede Query, MSW, Theresia Majors [phone]

## 2011-11-09 NOTE — Progress Notes (Signed)
Physical Therapy Treatment Patient Details Name: Amanda Armstrong MRN: [id number] DOB: 1946/01/06 Today's Date: 11/09/2011 Time: 4098-1191 PT Time Calculation (min): 25 min  PT Assessment / Plan / Recommendation Comments on Treatment Session  Pt admitted s/p left TKA and continues to progress.  Pt motivated and tolerating ambulation today.  Will ambulate next session without KI and continue to progress as able.    Follow Up Recommendations  Skilled nursing facility    Equipment Recommendations  Defer to next venue    Frequency 7X/week   Plan Discharge plan remains appropriate;Frequency remains appropriate    Precautions / Restrictions Precautions Precautions: Knee Precaution Booklet Issued: No Required Braces or Orthoses: Knee Immobilizer - Left Knee Immobilizer - Left: Discontinue post op day 2 Restrictions Weight Bearing Restrictions: Yes LLE Weight Bearing: Weight bearing as tolerated    Pertinent Vitals/Pain 3/10 in left knee.  Pt premedicated and repositioned with treatment.    Mobility  Bed Mobility Bed Mobility: Supine to Sit Supine to Sit: HOB flat;4: Min guard Details for Bed Mobility Assistance: Guarding for safety with cues for sequence. Transfers Transfers: Sit to Stand;Stand to Sit Sit to Stand: 3: Mod assist;With upper extremity assist;From bed Stand to Sit: With upper extremity assist;To chair/3-in-1;4: Min assist Details for Transfer Assistance: Assist for balance with cues for safest hand placement and sequence as well as placement of left LE. Ambulation/Gait Ambulation/Gait Assistance: 4: Min assist Ambulation Distance (Feet): 90 Feet Assistive device: Rolling walker Ambulation/Gait Assistance Details: Assist for balance with cues for tall posture and sequence/safety inside RW. Gait Pattern: Step-through pattern;Decreased step length - left;Decreased stance time - left Stairs: No Wheelchair Mobility Wheelchair Mobility: No    Exercises Total Joint  Exercises Ankle Circles/Pumps: AROM;10 reps;Both;Supine Quad Sets: AROM;Strengthening;Left;10 reps;Supine Heel Slides: AAROM;Left;10 reps;Supine Straight Leg Raises: AAROM;Strengthening;Left;10 reps;Supine   PT Goals Acute Rehab PT Goals PT Goal Formulation: With patient Time For Goal Achievement: 11/15/11 Potential to Achieve Goals: Good PT Goal: Supine/Side to Sit - Progress: Progressing toward goal PT Goal: Sit to Stand - Progress: Progressing toward goal PT Goal: Stand to Sit - Progress: Progressing toward goal PT Goal: Ambulate - Progress: Progressing toward goal PT Goal: Perform Home Exercise Program - Progress: Progressing toward goal  Visit Information  Last PT Received On: 11/09/11 Assistance Needed: +1 PT/OT Co-Evaluation/Treatment: Yes    Subjective Data  Subjective: "I am feeling better and stronger." Patient Stated Goal: Get well.   Cognition  Overall Cognitive Status: Appears within functional limits for tasks assessed/performed Arousal/Alertness: Awake/alert Orientation Level: Oriented X4 / Intact Behavior During Session: [facility] for tasks performed    Balance  Balance Balance Assessed: No  End of Session PT - End of Session Equipment Utilized During Treatment: Gait belt;Left knee immobilizer Activity Tolerance: Patient tolerated treatment well Patient left: in chair;with call bell/phone within reach Nurse Communication: Mobility status;Weight bearing status CPM Left Knee CPM Left Knee: Off    Cephus Shelling 11/09/2011, 12:45 PM  11/09/2011 Cephus Shelling, PT, DPT [phone]

## 2011-11-09 NOTE — Evaluation (Signed)
Occupational Therapy Evaluation and Discharge Patient Details Name: Amanda Armstrong MRN: [id number] DOB: 08-22-1945 Today's Date: 11/09/2011 Time: 827-848    OT Assessment / Plan / Recommendation Clinical Impression  Pt. presents s/p Left TKA and with increased pain. Pt. with plans to D/C to ST-SNF at D/C to continue therapies and will defer further OT needs to SNF and sign off acutely.     OT Assessment  All further OT needs can be met in the next venue of care    Follow Up Recommendations  Skilled nursing facility    Equipment Recommendations  Defer to next venue    Frequency      Precautions / Restrictions Precautions Precautions: Knee Precaution Booklet Issued: No Required Braces or Orthoses: Knee Immobilizer - Left Knee Immobilizer - Left: Discontinue post op day 2 Restrictions Weight Bearing Restrictions: Yes LLE Weight Bearing: Weight bearing as tolerated   Pertinent Vitals/Pain 3/10 knee    ADL  Eating/Feeding: Simulated;Independent Where Assessed - Eating/Feeding: Chair Grooming: Performed;Wash/dry face;Set up;Minimal assistance Where Assessed - Grooming: Standing at sink Upper Body Bathing: Simulated;Chest;Right arm;Left arm;Abdomen;Set up Where Assessed - Upper Body Bathing: Sitting, chair Lower Body Bathing: Simulated;Minimal assistance Where Assessed - Lower Body Bathing: Sit to stand from chair Upper Body Dressing: Performed;Minimal assistance Where Assessed - Upper Body Dressing: Sitting, chair Lower Body Dressing: Simulated;Minimal assistance Where Assessed - Lower Body Dressing: Sit to stand from chair Toilet Transfer: Simulated;Minimal assistance Toilet Transfer Method: Ambulating Toilet Transfer Equipment: Other (comment) Nurse, children's) Toileting - Clothing Manipulation: Simulated;Minimal assistance Where Assessed - Toileting Clothing Manipulation: Sit to stand from 3-in-1 or toilet Toileting - Hygiene: Simulated;Minimal assistance Where Assessed -  Toileting Hygiene: Sit to stand from 3-in-1 or toilet Tub/Shower Transfer: Not assessed Tub/Shower Transfer Method: Not assessed Equipment Used: Rolling walker Ambulation Related to ADLs: Pt. min-mod assist ~50' with RW and mod verbal cues for sequencing ADL Comments: Pt. educated on sequencing with RW during functional mobility and pt. educated on technique for completing LB ADLs with donning left LE first for pants/undergarments.        Visit Information  Last OT Received On: 11/09/11 Assistance Needed: +1 PT/OT Co-Evaluation/Treatment: Yes    Subjective Data  Subjective: "Maybe i will go get rehab" Patient Stated Goal: To walk further   Prior Functioning  Home Living Lives With: Spouse Available Help at Discharge: Family Type of Home: House Home Access: Stairs to enter [employer] of Steps: 1 Entrance Stairs-Rails: None Home Layout: One level Bathroom Shower/Tub: Forensic scientist: Standard Home Adaptive Equipment: Straight cane;Walker - rolling;Shower chair without back Prior Function Level of Independence: Independent Able to Take Stairs?: Yes Driving: Yes Vocation: Retired Musician: No difficulties               Mobility Bed Mobility Bed Mobility: Supine to Sit Supine to Sit: HOB flat;4: Min guard Details for Bed Mobility Assistance: Guarding for safety; no physical (A) needed.  Transfers Transfers: Sit to Stand;Stand to Sit Sit to Stand: 3: Mod assist;From chair/3-in-1;With upper extremity assist Stand to Sit: 3: Mod assist;With upper extremity assist;With armrests;To chair/3-in-1;To bed Details for Transfer Assistance: Min verbal cues for hand placement and technique            End of Session OT - End of Session Equipment Utilized During Treatment: Gait belt;Left knee immobilizer Activity Tolerance: Patient tolerated treatment well Patient left: in chair;with call bell/phone within reach Nurse  Communication: Mobility status   Jalilah Wiltsie, OTR/L Pager [phone] 11/09/2011, 10:24 AM

## 2011-11-09 NOTE — Progress Notes (Signed)
  CARE MANAGEMENT NOTE 11/09/2011  Patient:  [facility]   Account Number:  [id number]  Date Initiated:  11/09/2011  Documentation initiated by:  Vance Peper  Subjective/Objective Assessment:   66 yr old female s/p left total knee arthroplasty     Action/Plan:   Patient preoperatively setup with [facility]. Patient will be going to [facility] for shortterm rehab.   Anticipated DC Date:  11/10/2011   Anticipated DC Plan:  SKILLED NURSING FACILITY  In-house referral  Clinical Social Worker      DC Planning Services  CM consult      [facility] Choice  NA   Choice offered to / List presented to:  NA   DME arranged  NA      DME agency  NA     HH arranged  NA      Status of service:  Completed, signed off   Discharge Disposition:  SKILLED NURSING FACILITY

## 2011-11-09 NOTE — Progress Notes (Signed)
Clinical Social Work Department BRIEF PSYCHOSOCIAL ASSESSMENT 11/09/2011  Patient:  Amanda Armstrong, Amanda Armstrong     Account Number:  [id number]     Admit date:  11/07/2011  Clinical Social Worker:  Peggyann Shoals  Date/Time:  11/09/2011 10:30 AM  Referred by:  Physician  Date Referred:  11/08/2011 Referred for  SNF Placement   Other Referral:   Interview type:  Family Other interview type:    PSYCHOSOCIAL DATA Living Status:  HUSBAND Admitted from facility:   Level of care:   Primary support name:  Sarita Haver Primary support relationship to patient:  CHILD, ADULT Degree of support available:   Supportive. Contact information [phone]  Pt live with her husband, Fayrene Fearing.    CURRENT CONCERNS Current Concerns  Post-Acute Placement   Other Concerns:    SOCIAL WORK ASSESSMENT / PLAN CSW met with pt and daugther to address consult. CSW explained SNF process. Pt shared that she is pre registered with [facility] for SNF.   Assessment/plan status:  Other - See comment Other assessment/ plan:   FL2 completed and referral was sent to [facility]. CSW confirmed with Sherie Don that pt is able to come to her facility when medically ready.   Information/referral to community resources:   As needed.    PATIENT'S/FAMILY'S RESPONSE TO PLAN OF CARE: Pt and family were very pleasant and agreeable to discharge plan.   Dede Query, MSW, Theresia Majors [phone]

## 2011-11-10 LAB — CBC
HCT: 29.6 % — ABNORMAL LOW (ref 36.0–46.0)
Hemoglobin: 9.8 g/dL — ABNORMAL LOW (ref 12.0–15.0)
MCH: 30 pg (ref 26.0–34.0)
MCHC: 33.1 g/dL (ref 30.0–36.0)
RBC: 3.27 MIL/uL — ABNORMAL LOW (ref 3.87–5.11)

## 2011-11-10 LAB — BASIC METABOLIC PANEL
BUN: 8 mg/dL (ref 6–23)
CO2: 26 mEq/L (ref 19–32)
Calcium: 8.6 mg/dL (ref 8.4–10.5)
GFR calc non Af Amer: 89 mL/min — ABNORMAL LOW (ref >90)
Glucose, Bld: 91 mg/dL (ref 70–99)

## 2011-11-10 MED ORDER — CELECOXIB 200 MG PO CAPS: 200.0000 mg | ORAL_CAPSULE | Freq: Every day | ORAL | Status: AC

## 2011-11-10 MED ORDER — HYDROCODONE-ACETAMINOPHEN 10-325 MG PO TABS: 1.0000 | ORAL_TABLET | ORAL | Status: AC | PRN

## 2011-11-10 MED ORDER — ENOXAPARIN SODIUM 30 MG/0.3ML ~~LOC~~ SOLN: 30.0000 mg | Freq: Two times a day (BID) | SUBCUTANEOUS | Status: DC

## 2011-11-10 MED ORDER — DSS 100 MG PO CAPS: 100.0000 mg | ORAL_CAPSULE | Freq: Two times a day (BID) | ORAL | Status: AC

## 2011-11-10 NOTE — Progress Notes (Signed)
Physical Therapy Treatment Patient Details Name: Amanda Armstrong MRN: [id number] DOB: 1946-01-22 Today's Date: 11/10/2011 Time: 0865-7846 PT Time Calculation (min): 14 min  PT Assessment / Plan / Recommendation Comments on Treatment Session  Pt admitted s/p left TKA and is very motivated to progress. Pt able to tolerated increased ambulation distance with increased independence today.    Follow Up Recommendations  Skilled nursing facility    Equipment Recommendations  Defer to next venue    Frequency 7X/week   Plan Discharge plan remains appropriate;Frequency remains appropriate    Precautions / Restrictions Precautions Precautions: Knee Precaution Booklet Issued: No Required Braces or Orthoses: Knee Immobilizer - Left Knee Immobilizer - Left: Discontinue post op day 2 Restrictions Weight Bearing Restrictions: Yes LLE Weight Bearing: Weight bearing as tolerated    Pertinent Vitals/Pain 0/10 with treatment.    Mobility  Bed Mobility Bed Mobility: Not assessed Transfers Transfers: Sit to Stand;Stand to Sit Sit to Stand: 5: Supervision;With upper extremity assist;From chair/3-in-1 Stand to Sit: 5: Supervision;With upper extremity assist;To chair/3-in-1 Details for Transfer Assistance: Verbal cues for safest hand/left LE placement. Ambulation/Gait Ambulation/Gait Assistance: 4: Min guard Ambulation Distance (Feet): 140 Feet Assistive device: Rolling walker Ambulation/Gait Assistance Details: Verbal cues for tall posture and safest sequence with initial contact on left heel during step-through sequence. Gait Pattern: Step-through pattern;Decreased step length - left;Decreased stance time - left Stairs: No Wheelchair Mobility Wheelchair Mobility: No    Exercises Total Joint Exercises Ankle Circles/Pumps: AROM;10 reps;Both;Supine Quad Sets: AROM;Strengthening;Left;10 reps;Supine Heel Slides: AAROM;Left;10 reps;Supine Hip ABduction/ADduction: AROM;Left;10  reps;Supine Straight Leg Raises: AAROM;Strengthening;Left;10 reps;Supine Long Arc Quad: AROM;Left;10 reps;Seated Knee Flexion: AROM;Left;10 reps;Seated (AA/ROM left knee 0-70 degrees.)   PT Goals Acute Rehab PT Goals PT Goal Formulation: With patient Time For Goal Achievement: 11/15/11 Potential to Achieve Goals: Good PT Goal: Sit to Stand - Progress: Progressing toward goal PT Goal: Stand to Sit - Progress: Progressing toward goal PT Goal: Ambulate - Progress: Progressing toward goal PT Goal: Perform Home Exercise Program - Progress: Progressing toward goal  Visit Information  Last PT Received On: 11/10/11 Assistance Needed: +1    Subjective Data  Subjective: "Today is the day that I go." Patient Stated Goal: Get well.   Cognition  Overall Cognitive Status: Appears within functional limits for tasks assessed/performed Arousal/Alertness: Awake/alert Orientation Level: Oriented X4 / Intact Behavior During Session: [facility] for tasks performed    Balance  Balance Balance Assessed: No  End of Session PT - End of Session Equipment Utilized During Treatment: Gait belt Activity Tolerance: Patient tolerated treatment well Patient left: in chair;with call bell/phone within reach Nurse Communication: Mobility status;Weight bearing status    Cephus Shelling 11/10/2011, 10:11 AM  11/10/2011 Cephus Shelling, PT, DPT [phone]

## 2011-11-10 NOTE — Progress Notes (Signed)
Pt is ready for discharge to [facility] today. Facility is ready to accept pt. Pt and family are agreeable to discharge plan. [facility] will provide transportation. CSW is signing off as no further clinical social work needs identified.   Dede Query, MSW, Theresia Majors [phone]

## 2011-11-10 NOTE — Discharge Summary (Signed)
Patient ID: Amanda Armstrong MRN: [id number] DOB/AGE: 03/05/1946 66 y.o.  Admit date: 11/07/2011 Discharge date: 11/10/2011  Admission Diagnoses:  Principal Problem:  *Arthritis Active Problems:  Anxiety  Hypothyroidism   Discharge Diagnoses:  Same  Past Medical History  Diagnosis Date  . Hypothyroidism   . Anxiety   . Arthritis     Bilateral Knee DJD  . Headache     SINUS HEADACHE OCCASSIONALLY    Surgeries: Procedure(s): TOTAL KNEE ARTHROPLASTY on 11/07/2011   Consultants:  none  Discharged Condition: Improved  Hospital Course: MARIS ABASCAL is an 66 y.o. female who was admitted 11/07/2011 for operative treatment ofArthritis. Patient has severe unremitting pain that affects sleep, daily activities, and work/hobbies. After pre-op clearance the patient was taken to the operating room on 11/07/2011 and underwent  Procedure(s): TOTAL KNEE ARTHROPLASTY.    Patient was given perioperative antibiotics: Anti-infectives     Start     Dose/Rate Route Frequency Ordered Stop   11/07/11 1530   ceFAZolin (ANCEF) IVPB 1 g/50 mL premix        1 g 100 mL/hr over 30 Minutes Intravenous Every 6 hours 11/07/11 1327 11/08/11 0343   11/07/11 1016   cefUROXime (ZINACEF) injection  Status:  Discontinued          As needed 11/07/11 1017 11/07/11 1111   11/06/11 1446   ceFAZolin (ANCEF) IVPB 1 g/50 mL premix        1 g 100 mL/hr over 30 Minutes Intravenous 60 min pre-op 11/06/11 1446 11/07/11 0923           Patient was given sequential compression devices, early ambulation, and chemoprophylaxis to prevent DVT.  Patient benefited maximally from hospital stay and there were no complications.    Recent vital signs:  Patient Vitals for the past 24 hrs:   BP Temp Temp src Pulse Resp SpO2  11/10/11 0559 124/39 mmHg 97 F (36.1 C) - 69  18  97 %  11/09/11 2214 133/48 mmHg 97 F (36.1 C) - 69  18  97 %  11/09/11 1300 103/54 mmHg 97 F (36.1 C) Oral 73  18  93 %     Recent  laboratory studies:   Basename 11/10/11 0545 11/09/11 0615  WBC 6.5 7.3  HGB 9.8* 9.8*  HCT 29.6* 29.6*  PLT 234 201  NA 139 141  K 3.7 3.7  CL 106 109  CO2 26 25  BUN 8 9  CREATININE 0.65 0.64  GLUCOSE 91 100*  INR -- --  CALCIUM 8.6 --     Discharge Medications:   Medication List  As of 11/10/2011  8:44 AM   STOP taking these medications         naproxen sodium 220 MG tablet         TAKE these medications         ALPRAZolam 1 MG tablet   Commonly known as: XANAX   Take 1 mg by mouth 3 (three) times daily as needed. For anxiety      celecoxib 200 MG capsule   Commonly known as: CELEBREX   Take 1 capsule (200 mg total) by mouth daily.      DSS 100 MG Caps   Take 100 mg by mouth 2 (two) times daily.      enoxaparin 30 MG/0.3ML injection   Commonly known as: LOVENOX   Inject 0.3 mLs (30 mg total) into the skin every 12 (twelve) hours.      EQ ALLERGY RELIEF  PO   Take 1 tablet by mouth 2 (two) times daily as needed. For sinuses      HYDROcodone-acetaminophen 10-325 MG per tablet   Commonly known as: NORCO   Take 1-2 tablets by mouth every 4 (four) hours as needed.      levothyroxine 100 MCG tablet   Commonly known as: SYNTHROID, LEVOTHROID   Take 100 mcg by mouth daily.            Diagnostic Studies: Dg Chest 2 View  11/01/2011  *RADIOLOGY REPORT*  Clinical Data: Preop radiograph  CHEST - 2 VIEW  Comparison: 11/02/2010  Findings: The heart size and mediastinal contours are within normal limits.  Both lungs are clear.  The visualized skeletal structures are unremarkable.  IMPRESSION: No active cardiopulmonary abnormalities.  Original Report Authenticated By: Rosealee Albee, M.D.    Disposition: 03-Skilled Nursing Facility  Discharge Orders    Future Orders Please Complete By Expires   Diet - low sodium heart healthy      Call MD / Call 911      Comments:   If you experience chest pain or shortness of breath, CALL 911 and be transported to the  hospital emergency room.  If you develope a fever above 101 F, pus (white drainage) or increased drainage or redness at the wound, or calf pain, call your surgeon's office.   Constipation Prevention      Comments:   Drink plenty of fluids.  Prune juice may be helpful.  You may use a stool softener, such as Colace (over the counter) 100 mg twice a day.  Use MiraLax (over the counter) for constipation as needed.   Increase activity slowly as tolerated      Weight Bearing as taught in Physical Therapy      Comments:   Use a walker or crutches as instructed.   Discharge instructions      Comments:   Total Knee Replacement Care After Refer to this sheet in the next few weeks. These discharge instructions provide you with general information on caring for yourself after you leave the hospital. Your caregiver may also give you specific instructions. Your treatment has been planned according to the most current medical practices available, but unavoidable complications sometimes occur. If you have any problems or questions after discharge, please call your caregiver. Regaining a near full range of motion of your knee within the first 3 to 6 weeks after surgery is critical. HOME CARE INSTRUCTIONS  You may resume a normal diet and activities as directed.  Perform exercises as directed.  Place yellow foam block, yellow side up under heel at all times except when in CPM or when walking.  DO NOT modify, tear, cut, or change in any way. You will receive physical therapy daily  Take showers instead of baths until informed otherwise.  Change bandages (dressings)daily Do not take over-the-counter or prescription medicines for pain, discomfort, or fever. Eat a well-balanced diet.  Avoid lifting or driving until you are instructed otherwise.  Make an appointment to see your caregiver for stitches (suture) or staple removal as directed.  If you have been sent home with a continuous passive motion machine (CPM  machine), 0-90 degrees 6 hrs a day   2 hrs a shift SEEK MEDICAL CARE IF: You have swelling of your calf or leg.  You develop shortness of breath or chest pain.  You have redness, swelling, or increasing pain in the wound.  There is pus or any unusual  drainage coming from the surgical site.  You notice a bad smell coming from the surgical site or dressing.  The surgical site breaks open after sutures or staples have been removed.  There is persistent bleeding from the suture or staple line.  You are getting worse or are not improving.  You have any other questions or concerns.  SEEK IMMEDIATE MEDICAL CARE IF:  You have a fever.  ny way the yellow foam block.You develop a rash.  You have difficulty breathing.  You develop any reaction or side effects to medicines given.  Your knee motion is decreasing rather than improving.  MAKE SURE YOU:  Understand these instructions.  Will watch your condition.  Will get help right away if you are not doing well or get worse.     CPM      Comments:   Continuous passive motion machine (CPM):      Use the CPM from 0 to 90 for 6 hours per day.      You may break it up into 2 or 3 sessions per day.      Use CPM for 2 weeks or until you are told to stop.   TED hose      Comments:   Use stockings (TED hose) for 2 weeks on both leg(s).  You may remove them at night for sleeping.   Change dressing      Comments:   Change the dressing daily with sterile 4 x 4 inch gauze dressing and apply TED hose.  You may clean the incision with alcohol prior to redressing.   Do not put a pillow under the knee. Place it under the heel.      Comments:   Place yellow foam block, yellow side up under heel at all times except when in CPM or when walking.  DO NOT modify, tear, cut, or change in any way the yellow foam block.      Follow-up Information    Follow up with Nilda Simmer, MD on 11/21/2011. (appt time 3:00)    Contact information:   [facility] [address] [place] [address] [phone]           Signed: Pascal Lux 11/10/2011, 8:44 AM

## 2013-04-26 ENCOUNTER — Emergency Department (HOSPITAL_COMMUNITY): Payer: Medicare Other

## 2013-04-26 ENCOUNTER — Emergency Department (HOSPITAL_COMMUNITY)
Admission: EM | Admit: 2013-04-26 | Discharge: 2013-04-26 | Disposition: A | Discharge status: Home / Self Care | Payer: Medicare Other | Attending: Emergency Medicine | Admitting: Emergency Medicine

## 2013-04-26 ENCOUNTER — Encounter (HOSPITAL_COMMUNITY): Payer: Self-pay | Admitting: Emergency Medicine

## 2013-04-26 DIAGNOSIS — M1710 Unilateral primary osteoarthritis, unspecified knee: Secondary | ICD-10-CM | POA: Insufficient documentation

## 2013-04-26 DIAGNOSIS — R42 Dizziness and giddiness: Secondary | ICD-10-CM

## 2013-04-26 DIAGNOSIS — N390 Urinary tract infection, site not specified: Secondary | ICD-10-CM

## 2013-04-26 DIAGNOSIS — Z87891 Personal history of nicotine dependence: Secondary | ICD-10-CM | POA: Insufficient documentation

## 2013-04-26 DIAGNOSIS — F411 Generalized anxiety disorder: Secondary | ICD-10-CM | POA: Insufficient documentation

## 2013-04-26 DIAGNOSIS — Z79899 Other long term (current) drug therapy: Secondary | ICD-10-CM | POA: Insufficient documentation

## 2013-04-26 DIAGNOSIS — E039 Hypothyroidism, unspecified: Secondary | ICD-10-CM | POA: Insufficient documentation

## 2013-04-26 HISTORY — DX: Dizziness and giddiness: R42

## 2013-04-26 LAB — CBC WITH DIFFERENTIAL/PLATELET
Basophils Absolute: 0 10*3/uL (ref 0.0–0.1)
Eosinophils Relative: 3 % (ref 0–5)
Lymphocytes Relative: 40 % (ref 12–46)
Lymphs Abs: 3 10*3/uL (ref 0.7–4.0)
MCV: 89.2 fL (ref 78.0–100.0)
Neutro Abs: 3.7 10*3/uL (ref 1.7–7.7)
Neutrophils Relative %: 49 % (ref 43–77)
Platelets: 244 10*3/uL (ref 150–400)
RBC: 4.71 MIL/uL (ref 3.87–5.11)
RDW: 13.8 % (ref 11.5–15.5)
WBC: 7.6 10*3/uL (ref 4.0–10.5)

## 2013-04-26 LAB — URINE MICROSCOPIC-ADD ON

## 2013-04-26 LAB — COMPREHENSIVE METABOLIC PANEL
Albumin: 4 g/dL (ref 3.5–5.2)
Alkaline Phosphatase: 63 U/L (ref 39–117)
BUN: 18 mg/dL (ref 6–23)
Chloride: 106 mEq/L (ref 96–112)
Creatinine, Ser: 0.66 mg/dL (ref 0.50–1.10)
GFR calc Af Amer: >90 mL/min (ref >90)
Glucose, Bld: 84 mg/dL (ref 70–99)
Potassium: 4.1 mEq/L (ref 3.5–5.1)
Total Bilirubin: 0.4 mg/dL (ref 0.3–1.2)
Total Protein: 7.6 g/dL (ref 6.0–8.3)

## 2013-04-26 LAB — URINALYSIS, ROUTINE W REFLEX MICROSCOPIC
Glucose, UA: NEGATIVE mg/dL
Ketones, ur: NEGATIVE mg/dL
Nitrite: POSITIVE — AB
Specific Gravity, Urine: 1.025 (ref 1.005–1.030)
pH: 6 (ref 5.0–8.0)

## 2013-04-26 MED ORDER — CEPHALEXIN 500 MG PO CAPS
500.0000 mg | ORAL_CAPSULE | Freq: Once | ORAL | Status: AC
  Administered 2013-04-26: 500 mg via ORAL
  Filled 2013-04-26: qty 1

## 2013-04-26 MED ORDER — LORAZEPAM 1 MG PO TABS
ORAL_TABLET | ORAL | Status: AC
  Filled 2013-04-26: qty 1

## 2013-04-26 MED ORDER — LORAZEPAM 1 MG PO TABS
1.0000 mg | ORAL_TABLET | Freq: Once | ORAL | Status: AC
  Administered 2013-04-26: 1 mg via ORAL

## 2013-04-26 MED ORDER — CEPHALEXIN 500 MG PO CAPS: 500.0000 mg | ORAL_CAPSULE | Freq: Four times a day (QID) | ORAL | Status: DC

## 2013-04-26 NOTE — ED Notes (Signed)
MD at bedside. 

## 2013-04-26 NOTE — ED Notes (Signed)
Pt reports being sick w/ weakness for 2 months, was seen by her pmd, last week and told she had vertigo, and started on meclizine.   Thinks she is not getting any better.

## 2013-04-26 NOTE — ED Provider Notes (Signed)
CSN: [id number]     Arrival date & time 04/26/13  1503 History   First MD Initiated Contact with Patient 04/26/13 1507    This chart was scribed for Vida Roller, MD by Manuela Schwartz, ED scribe. This patient was seen in room APA19/APA19 and the patient's care was started at 1503.  Chief Complaint  Patient presents with  . Weakness   The history is provided by the patient. No language interpreter was used.   HPI Comments: Amanda Armstrong is a 67 y.o. female brought in by ambulance, who presents to the Emergency Department complaining of light headiness, feeling off-balance, and vertigo, intermittently, and usually in the mornings over the past 2 months which she attributes to recent medicine changes. She states in the morning when she stands up, she has trouble gaining her balance while walking and feels unsteady but after taking a nap in the morning, her sx usually resolve by afternoon and evening. She states last month she had an episode of difficulty walking/feeling off-balance. And yesterday her husband had her use a walker to walk last night because she felt like she was off balance. She states has been taking synthroid for more than a year, xanax for a year, meclozine for 3 weeks, and cholesterol stat and medicine since 9-10 weeks ago. She states the Meclizine was prescribed for inner ear issues and she doesn't think its helping her - she takes his medication in the morning, she does not take it in the afternoon or evening. She denies fever/chills, visual disturbances, abdominal pain, urinary sx, CP, SOB, cough, sore throat, rash, pitting edema, diarrhea.  Aleve daily for bilat knee replace-   quit 11 months ago smoke, no etoh, no illicit drugs  Past Medical History  Diagnosis Date  . Hypothyroidism   . Anxiety   . Arthritis     Bilateral Knee DJD  . Headache(784.0)     SINUS HEADACHE OCCASSIONALLY  . Vertigo    Past Surgical History  Procedure Laterality Date  . Joint replacement   11/08/3010    right total knee  . Tubal ligation  1982  . Total knee arthroplasty  11/07/2011    Procedure: TOTAL KNEE ARTHROPLASTY;  Surgeon: Nilda Simmer, MD;  Location: [facility] OR;  Service: Orthopedics;  Laterality: Left;  DR Thurston Hole WANTS 90 MINUTES FOR THIS CASE   Family History  Problem Relation Age of Onset  . Heart attack Mother   . COPD Father   . Arthritis Sister   . Heart disease Brother    History  Substance Use Topics  . Smoking status: Former Smoker -- 1.00 packs/day for 48 years    Types: Cigarettes    Quit date: 06/15/2012  . Smokeless tobacco: Not on file  . Alcohol Use: No   OB History   Grav Para Term Preterm Abortions TAB SAB Ect Mult Living                 Review of Systems  Constitutional: Negative for fever and chills.  Respiratory: Negative for shortness of breath.   Gastrointestinal: Negative for nausea and vomiting.  Neurological: Positive for light-headedness. Negative for weakness.       Vertigo, feeling off-balance.   All other systems reviewed and are negative.   A complete 10 system review of systems was obtained and all systems are negative except as noted in the HPI and PMH.   Allergies  Oxycodone  Home Medications   Current Outpatient Rx  Name  Route  Sig  Dispense  Refill  . alendronate (FOSAMAX) 70 MG tablet   Oral   Take 70 mg by mouth every 7 (seven) days.          . ALPRAZolam (XANAX) 1 MG tablet   Oral   Take 1 mg by mouth at bedtime. Prescribed one tablet three times daily as needed For anxiety         . atorvastatin (LIPITOR) 10 MG tablet   Oral   Take 10 mg by mouth every morning.         . fexofenadine (ALLEGRA) 180 MG tablet   Oral   Take 180 mg by mouth once a week.         . levothyroxine (SYNTHROID, LEVOTHROID) 100 MCG tablet   Oral   Take 100 mcg by mouth every morning.          . naproxen sodium (ALEVE) 220 MG tablet   Oral   Take 440 mg by mouth at bedtime as needed (for pain).         .  cephALEXin (KEFLEX) 500 MG capsule   Oral   Take 1 capsule (500 mg total) by mouth 4 (four) times daily.   28 capsule   0    Triage Vitals: BP 131/50  Pulse 67  Temp(Src) 97.6 F (36.4 C) (Oral)  Resp 20  Ht 4\' 10"  (1.473 m)  Wt 130 lb (58.968 kg)  BMI 27.18 kg/m2  SpO2 97% Physical Exam  Nursing note and vitals reviewed. Constitutional: She is oriented to person, place, and time. She appears well-developed and well-nourished. No distress.  HENT:  Head: Normocephalic and atraumatic.  Eyes: EOM are normal.  Neck: Neck supple. No tracheal deviation present.  Cardiovascular: Normal rate.   Pulmonary/Chest: Effort normal. No respiratory distress.  Musculoskeletal: Normal range of motion.  Neurological: She is alert and oriented to person, place, and time.  Speech is clear, cranial nerves III through XII are intact, memory is intact, strength is normal in all 4 extremities including grips, sensation is intact to light touch and pinprick in all 4 extremities. Coordination as tested by finger-nose-finger is normal, no limb ataxia. Gait is slightly off balance occasionally. Patient states that she is having any room spinning feeling as she ambulates, normal reflexes at the patellar tendons bilaterally  Skin: Skin is warm and dry.  Psychiatric: She has a normal mood and affect. Her behavior is normal.    ED Course  Procedures (including critical care time) DIAGNOSTIC STUDIES: Oxygen Saturation is 97% on room air, normal by my interpretation.    COORDINATION OF CARE: At 3:40 PM Discussed treatment plan with patient which includes MRI, labs. Patient agrees.   Labs Review Labs Reviewed  COMPREHENSIVE METABOLIC PANEL - Abnormal; Notable for the following:    GFR calc non Af Amer 88 (*)    All other components within normal limits  URINALYSIS, ROUTINE W REFLEX MICROSCOPIC - Abnormal; Notable for the following:    APPearance CLOUDY (*)    Hgb urine dipstick TRACE (*)    Nitrite  POSITIVE (*)    Leukocytes, UA LARGE (*)    All other components within normal limits  URINE MICROSCOPIC-ADD ON - Abnormal; Notable for the following:    Squamous Epithelial / LPF FEW (*)    Bacteria, UA MANY (*)    All other components within normal limits  URINE CULTURE  CBC WITH DIFFERENTIAL   Imaging Review Mr Brain Wo Contrast  04/26/2013  CLINICAL DATA:  Several days of dizziness and off balance with weakness and difficulty speaking.  EXAM: MRI HEAD WITHOUT CONTRAST  TECHNIQUE: Multiplanar, multisequence MR imaging was performed. No intravenous contrast was administered.  COMPARISON:  None.  FINDINGS: Exam is motion degraded.  No acute infarct.  No intracranial hemorrhage.  Mild to moderate small vessel disease type changes.  Major intracranial vascular structures are patent.  Global atrophy. Ventricular prominence probably related to atrophy although difficult to completely exclude a mild component of hydrocephalus.  No intracranial mass lesion noted on this unenhanced exam.  Cervical spondylotic changes C3-4 level cord flattening. Congenital fusion C2 and C3.  Mild exophthalmos.  Partially empty sella.  IMPRESSION: Exam is motion degraded.  No acute infarct.  No intracranial hemorrhage.  Mild to moderate small vessel disease type changes.  Global atrophy. Ventricular prominence probably related to atrophy although difficult to completely exclude a mild component of hydrocephalus.  No intracranial mass lesion noted on this unenhanced exam.  Cervical spondylotic changes C3-4 level cord flattening. Congenital fusion C2 and C3.   Electronically Signed   By: Bridgett Larsson M.D.   On: 04/26/2013 17:30    EKG Interpretation     Ventricular Rate:  58 PR Interval:  178 QRS Duration: 86 QT Interval:  440 QTC Calculation: 431 R Axis:   6 Text Interpretation:  Sinus bradycardia Low voltage QRS Borderline ECG When compared with ECG of 01-Nov-2011 11:46, No significant change was found             MDM   1. UTI (lower urinary tract infection)   2. Vertigo    I have personally ambulated with the patient, her only abnormal finding on ambulation is a slight occasional ataxia which she attributes to the fact that the room is spinning when she walks. Almost immediately after sitting down the feeling of vertigo subsides. Otherwise she has no focal neurologic deficits. While this could be related to medication changes including starting an antihistamine such as meclizine, a recent [place], it could also be related to problems such as anemia as she does take meds as daily and may have a stomach ulcer, electrolyte abnormalities, dehydration. It could also be related to inner ear problems or a primary posterior circulation problem as well. MRI, labs, reevaluation.   The patient is asymptomatic unless she is walking, her labs show that she has a urinary tract infection but no other acute findings. MRI of the brain shows no significant findings that would cause acute changes in her neurologic function, possible hydrocephalus but is motion degraded, can followup with family doctor for further testing. She has expressed her understanding.   Meds given in ED:  Medications  cephALEXin (KEFLEX) capsule 500 mg (not administered)  LORazepam (ATIVAN) tablet 1 mg ( Oral Duplicate 04/26/13 1617)    New Prescriptions   CEPHALEXIN (KEFLEX) 500 MG CAPSULE    Take 1 capsule (500 mg total) by mouth 4 (four) times daily.     I personally performed the services described in this documentation, which was scribed in my presence. The recorded information has been reviewed and is accurate.        Vida Roller, MD 04/26/13 [phone]

## 2013-04-29 LAB — URINE CULTURE: Colony Count: >=100000

## 2013-05-01 NOTE — ED Notes (Signed)
+   Urine Chart appended per protocol MD. 

## 2013-05-27 ENCOUNTER — Emergency Department (HOSPITAL_COMMUNITY): Payer: Medicare Other

## 2013-05-27 ENCOUNTER — Inpatient Hospital Stay (HOSPITAL_COMMUNITY): Payer: Medicare Other

## 2013-05-27 ENCOUNTER — Inpatient Hospital Stay (HOSPITAL_COMMUNITY)
Admission: EM | Admit: 2013-05-27 | Discharge: 2013-05-31 | DRG: 872 | Disposition: A | Discharge status: Skilled Nursing Facility | Payer: Medicare Other | Attending: Family Medicine | Admitting: Family Medicine

## 2013-05-27 ENCOUNTER — Encounter (HOSPITAL_COMMUNITY): Payer: Self-pay | Admitting: Emergency Medicine

## 2013-05-27 DIAGNOSIS — G8929 Other chronic pain: Secondary | ICD-10-CM | POA: Diagnosis present

## 2013-05-27 DIAGNOSIS — R739 Hyperglycemia, unspecified: Secondary | ICD-10-CM | POA: Diagnosis present

## 2013-05-27 DIAGNOSIS — R0682 Tachypnea, not elsewhere classified: Secondary | ICD-10-CM | POA: Diagnosis present

## 2013-05-27 DIAGNOSIS — N390 Urinary tract infection, site not specified: Secondary | ICD-10-CM | POA: Diagnosis present

## 2013-05-27 DIAGNOSIS — N10 Acute tubulo-interstitial nephritis: Secondary | ICD-10-CM | POA: Diagnosis present

## 2013-05-27 DIAGNOSIS — Z6831 Body mass index (BMI) 31.0-31.9, adult: Secondary | ICD-10-CM

## 2013-05-27 DIAGNOSIS — Z87440 Personal history of urinary (tract) infections: Secondary | ICD-10-CM

## 2013-05-27 DIAGNOSIS — E669 Obesity, unspecified: Secondary | ICD-10-CM | POA: Diagnosis present

## 2013-05-27 DIAGNOSIS — E876 Hypokalemia: Secondary | ICD-10-CM | POA: Diagnosis present

## 2013-05-27 DIAGNOSIS — Z8249 Family history of ischemic heart disease and other diseases of the circulatory system: Secondary | ICD-10-CM

## 2013-05-27 DIAGNOSIS — F411 Generalized anxiety disorder: Secondary | ICD-10-CM | POA: Diagnosis present

## 2013-05-27 DIAGNOSIS — Z96659 Presence of unspecified artificial knee joint: Secondary | ICD-10-CM

## 2013-05-27 DIAGNOSIS — Z87891 Personal history of nicotine dependence: Secondary | ICD-10-CM

## 2013-05-27 DIAGNOSIS — R7309 Other abnormal glucose: Secondary | ICD-10-CM | POA: Diagnosis present

## 2013-05-27 DIAGNOSIS — T502X5A Adverse effect of carbonic-anhydrase inhibitors, benzothiadiazides and other diuretics, initial encounter: Secondary | ICD-10-CM | POA: Diagnosis present

## 2013-05-27 DIAGNOSIS — E039 Hypothyroidism, unspecified: Secondary | ICD-10-CM | POA: Diagnosis present

## 2013-05-27 DIAGNOSIS — A419 Sepsis, unspecified organism: Principal | ICD-10-CM | POA: Diagnosis present

## 2013-05-27 DIAGNOSIS — F419 Anxiety disorder, unspecified: Secondary | ICD-10-CM | POA: Diagnosis present

## 2013-05-27 DIAGNOSIS — D72829 Elevated white blood cell count, unspecified: Secondary | ICD-10-CM | POA: Diagnosis present

## 2013-05-27 DIAGNOSIS — R000 Tachycardia, unspecified: Secondary | ICD-10-CM | POA: Diagnosis present

## 2013-05-27 LAB — URINALYSIS, ROUTINE W REFLEX MICROSCOPIC
Bilirubin Urine: NEGATIVE
Glucose, UA: NEGATIVE mg/dL
Nitrite: POSITIVE — AB
Protein, ur: 30 mg/dL — AB
Specific Gravity, Urine: 1.02 (ref 1.005–1.030)
Urobilinogen, UA: >8 mg/dL — ABNORMAL HIGH (ref 0.0–1.0)
pH: 6.5 (ref 5.0–8.0)

## 2013-05-27 LAB — CBC WITH DIFFERENTIAL/PLATELET
Basophils Absolute: 0 10*3/uL (ref 0.0–0.1)
Basophils Relative: 0 % (ref 0–1)
Eosinophils Absolute: 0 10*3/uL (ref 0.0–0.7)
Eosinophils Relative: 0 % (ref 0–5)
HCT: 37.4 % (ref 36.0–46.0)
Hemoglobin: 12.5 g/dL (ref 12.0–15.0)
Lymphocytes Relative: 5 % — ABNORMAL LOW (ref 12–46)
Lymphs Abs: 1 10*3/uL (ref 0.7–4.0)
MCH: 29.7 pg (ref 26.0–34.0)
MCHC: 33.4 g/dL (ref 30.0–36.0)
MCV: 88.8 fL (ref 78.0–100.0)
Monocytes Absolute: 1.5 10*3/uL — ABNORMAL HIGH (ref 0.1–1.0)
Monocytes Relative: 8 % (ref 3–12)
Neutro Abs: 15.9 10*3/uL — ABNORMAL HIGH (ref 1.7–7.7)
Neutrophils Relative %: 86 % — ABNORMAL HIGH (ref 43–77)
Platelets: 211 10*3/uL (ref 150–400)
RBC: 4.21 MIL/uL (ref 3.87–5.11)
RDW: 14.5 % (ref 11.5–15.5)
WBC: 18.4 10*3/uL — ABNORMAL HIGH (ref 4.0–10.5)

## 2013-05-27 LAB — COMPREHENSIVE METABOLIC PANEL
ALT: 26 U/L (ref 0–35)
AST: 17 U/L (ref 0–37)
Albumin: 3.7 g/dL (ref 3.5–5.2)
Alkaline Phosphatase: 62 U/L (ref 39–117)
BUN: 16 mg/dL (ref 6–23)
CO2: 25 mEq/L (ref 19–32)
Calcium: 9.3 mg/dL (ref 8.4–10.5)
Chloride: 100 mEq/L (ref 96–112)
Creatinine, Ser: 0.82 mg/dL (ref 0.50–1.10)
GFR calc Af Amer: 83 mL/min — ABNORMAL LOW (ref >90)
GFR calc non Af Amer: 71 mL/min — ABNORMAL LOW (ref >90)
Glucose, Bld: 193 mg/dL — ABNORMAL HIGH (ref 70–99)
Potassium: 3.5 mEq/L (ref 3.5–5.1)
Sodium: 137 mEq/L (ref 135–145)
Total Bilirubin: 1.2 mg/dL (ref 0.3–1.2)
Total Protein: 7.2 g/dL (ref 6.0–8.3)

## 2013-05-27 LAB — CG4 I-STAT (LACTIC ACID): Lactic Acid, Venous: 2.81 mmol/L — ABNORMAL HIGH (ref 0.5–2.2)

## 2013-05-27 LAB — URINE MICROSCOPIC-ADD ON

## 2013-05-27 MED ORDER — ONDANSETRON HCL 4 MG/2ML IJ SOLN
4.0000 mg | Freq: Four times a day (QID) | INTRAMUSCULAR | Status: DC | PRN
  Administered 2013-05-27: 4 mg via INTRAVENOUS
  Filled 2013-05-27: qty 2

## 2013-05-27 MED ORDER — ALPRAZOLAM 1 MG PO TABS
1.0000 mg | ORAL_TABLET | Freq: Every day | ORAL | Status: DC
  Administered 2013-05-27 – 2013-05-30 (×4): 1 mg via ORAL
  Filled 2013-05-27 (×4): qty 1

## 2013-05-27 MED ORDER — HYDROCODONE-ACETAMINOPHEN 5-325 MG PO TABS
1.0000 | ORAL_TABLET | ORAL | Status: DC | PRN
  Administered 2013-05-29: 2 via ORAL
  Filled 2013-05-27: qty 2

## 2013-05-27 MED ORDER — LEVALBUTEROL HCL 0.63 MG/3ML IN NEBU: 0.6300 mg | INHALATION_SOLUTION | Freq: Four times a day (QID) | RESPIRATORY_TRACT | Status: DC | PRN

## 2013-05-27 MED ORDER — SODIUM CHLORIDE 0.9 % IV BOLUS (SEPSIS)
500.0000 mL | Freq: Once | INTRAVENOUS | Status: AC
  Administered 2013-05-27: 500 mL via INTRAVENOUS

## 2013-05-27 MED ORDER — ATORVASTATIN CALCIUM 10 MG PO TABS
10.0000 mg | ORAL_TABLET | Freq: Every day | ORAL | Status: DC
Start: 2013-05-27 — End: 2013-05-30
  Filled 2013-05-27 (×3): qty 1

## 2013-05-27 MED ORDER — LORATADINE 10 MG PO TABS
10.0000 mg | ORAL_TABLET | Freq: Every day | ORAL | Status: DC
  Administered 2013-05-27 – 2013-05-31 (×5): 10 mg via ORAL
  Filled 2013-05-27 (×5): qty 1

## 2013-05-27 MED ORDER — HYDROMORPHONE HCL PF 1 MG/ML IJ SOLN: 0.5000 mg | INTRAMUSCULAR | Status: DC | PRN

## 2013-05-27 MED ORDER — NAPROXEN 250 MG PO TABS: 500.0000 mg | ORAL_TABLET | Freq: Every evening | ORAL | Status: DC | PRN

## 2013-05-27 MED ORDER — FLEET ENEMA 7-19 GM/118ML RE ENEM: 1.0000 | ENEMA | Freq: Once | RECTAL | Status: AC | PRN

## 2013-05-27 MED ORDER — ACETAMINOPHEN 325 MG PO TABS
650.0000 mg | ORAL_TABLET | Freq: Four times a day (QID) | ORAL | Status: DC | PRN
  Administered 2013-05-28: 650 mg via ORAL
  Filled 2013-05-27: qty 2

## 2013-05-27 MED ORDER — ONDANSETRON HCL 4 MG PO TABS: 4.0000 mg | ORAL_TABLET | Freq: Four times a day (QID) | ORAL | Status: DC | PRN

## 2013-05-27 MED ORDER — LEVOTHYROXINE SODIUM 100 MCG PO TABS
100.0000 ug | ORAL_TABLET | Freq: Every day | ORAL | Status: DC
  Administered 2013-05-28 – 2013-05-31 (×4): 100 ug via ORAL
  Filled 2013-05-27 (×4): qty 1

## 2013-05-27 MED ORDER — SODIUM CHLORIDE 0.9 % IV SOLN
INTRAVENOUS | Status: DC
  Administered 2013-05-28 – 2013-05-30 (×3): via INTRAVENOUS

## 2013-05-27 MED ORDER — BISACODYL 10 MG RE SUPP: 10.0000 mg | Freq: Every day | RECTAL | Status: DC | PRN

## 2013-05-27 MED ORDER — ENOXAPARIN SODIUM 40 MG/0.4ML ~~LOC~~ SOLN
40.0000 mg | SUBCUTANEOUS | Status: DC
  Administered 2013-05-27 – 2013-05-31 (×5): 40 mg via SUBCUTANEOUS
  Filled 2013-05-27 (×5): qty 0.4

## 2013-05-27 MED ORDER — SODIUM CHLORIDE 0.9 % IV BOLUS (SEPSIS)
1000.0000 mL | Freq: Once | INTRAVENOUS | Status: AC
  Administered 2013-05-27: 1000 mL via INTRAVENOUS

## 2013-05-27 MED ORDER — ACETAMINOPHEN 500 MG PO TABS
1000.0000 mg | ORAL_TABLET | Freq: Once | ORAL | Status: AC
  Administered 2013-05-27: 1000 mg via ORAL
  Filled 2013-05-27: qty 2

## 2013-05-27 MED ORDER — ACETAMINOPHEN 650 MG RE SUPP: 650.0000 mg | Freq: Four times a day (QID) | RECTAL | Status: DC | PRN

## 2013-05-27 MED ORDER — DEXTROSE 5 % IV SOLN
1.0000 g | Freq: Once | INTRAVENOUS | Status: AC
  Administered 2013-05-27: 1 g via INTRAVENOUS
  Filled 2013-05-27: qty 10

## 2013-05-27 MED ORDER — DEXTROSE 5 % IV SOLN
1.0000 g | INTRAVENOUS | Status: DC
  Administered 2013-05-28 – 2013-05-31 (×4): 1 g via INTRAVENOUS
  Filled 2013-05-27 (×7): qty 10

## 2013-05-27 MED ORDER — ALUM & MAG HYDROXIDE-SIMETH 200-200-20 MG/5ML PO SUSP: 30.0000 mL | Freq: Four times a day (QID) | ORAL | Status: DC | PRN

## 2013-05-27 NOTE — Progress Notes (Signed)
ANTIBIOTIC CONSULT NOTE - INITIAL  Pharmacy Consult for Rocephin Indication: UTI  Allergies  Allergen Reactions  . Oxycodone Other (See Comments)    "CX HER TO FELL LIKE SHE IS OUT OF HER BODY"   Patient Measurements: Height: 4' 10.5" (148.6 cm) Weight: 152 lb (68.947 kg) IBW/kg (Calculated) : 42.05  Vital Signs: Temp: 98 F (36.7 C) (11/10 1352) Temp src: Oral (11/10 1352) BP: 85/49 mmHg (11/10 1352) Pulse Rate: 89 (11/10 1352) Intake/Output from previous day:   Intake/Output from this shift: Total I/O In: 500 [I.V.:500] Out: 8 [Urine:8]  Labs:  Recent Labs  05/27/13 0947  WBC 18.4*  HGB 12.5  PLT 211  CREATININE 0.82   Estimated Creatinine Clearance: 54 ml/min (by C-G formula based on Cr of 0.82). No results found for this basename: VANCOTROUGH, Leodis Binet, VANCORANDOM, GENTTROUGH, GENTPEAK, GENTRANDOM, TOBRATROUGH, TOBRAPEAK, TOBRARND, AMIKACINPEAK, AMIKACINTROU, AMIKACIN,  in the last 72 hours   Microbiology: Recent Results (from the past 720 hour(s))  CULTURE, BLOOD (ROUTINE X 2)     Status: None   Collection Time    05/27/13 10:05 AM      Result Value Range Status   Specimen Description Blood RIGHT ARM   Final   Special Requests BOTTLES DRAWN AEROBIC AND ANAEROBIC 8 CC EACH   Final   Culture NO GROWTH <24 HRS   Final   Report Status PENDING   Incomplete  CULTURE, BLOOD (ROUTINE X 2)     Status: None   Collection Time    05/27/13 10:12 AM      Result Value Range Status   Specimen Description Blood RIGHT HAND   Final   Special Requests BOTTLES DRAWN AEROBIC ONLY 6 CC   Final   Culture NO GROWTH <24 HRS   Final   Report Status PENDING   Incomplete    Medical History: Past Medical History  Diagnosis Date  . Hypothyroidism   . Anxiety   . Arthritis     Bilateral Knee DJD  . Headache(784.0)     SINUS HEADACHE OCCASSIONALLY  . Vertigo     Medications:  Scheduled:  . ALPRAZolam  1 mg Oral QHS  . atorvastatin  10 mg Oral q1800  . [START ON  05/28/2013] cefTRIAXone (ROCEPHIN)  IV  1 g Intravenous Q24H  . enoxaparin (LOVENOX) injection  40 mg Subcutaneous Q24H  . [START ON 05/28/2013] levothyroxine  100 mcg Oral QAC breakfast  . loratadine  10 mg Oral Daily   Assessment: 67yo female admitted with UTI and weakness.  Estimated Creatinine Clearance: 54 ml/min (by C-G formula based on Cr of 0.82).  Goal of Therapy:  Eradicate infection.  Plan:  Rocephin 1gm IV q24h Monitor labs and cultures  Valrie Hart A 05/27/2013,2:07 PM

## 2013-05-27 NOTE — H&P (Signed)
[facility] History and Physical  NASHALIE SALLIS UJW:[id number] DOB: [birth year] DOA: 05/27/2013  Referring physician:  PCP: [name], MD  Specialists:   Chief Complaint: Generalized weakness  HPI: Amanda Armstrong is a 67 y.o. female with a past medical history that includes recent UTI, hypothyroidism, anxiety, vertigo presents to the emergency department with chief complaint of generalized weakness over the last several days. Information is obtained by the patient and her husband who is at the bedside. 3 weeks ago patient was seen for similar symptoms. An MRI of the brain was completed during that ED visit but was motion degraded. She was diagnosed at that time with urinary tract infection and was started on Keflex. Her symptoms improved. She was seen by her PCP since that time and she also has an appointment with a neurologist for followup. Husband reports that day before yesterday patient's appetite was decreased. There is no complaint of abdominal pain nausea or vomiting. Yesterday she became so weak she couldn't stand up straight with her walker. She reported that she did complete the Keflex and that she got better for a few days. She states that she feels so weak and she is afraid of falling from losing her balance. He admits to chronic low back pain as well as bilateral knee pain. She denies any dysuria hematuria frequency or urgency. She denies any fever chills. She reports she had a normal bowel movement yesterday. She denies diarrhea melena or bright red blood per rectum. In the emergency room she was found to be slightly febrile with a rectal temp of 100.5, tachycardic with tachypnea and stable blood pressure. Lab work significant for white count of 18.4, glucose of 193 and a lactic acid of 2.8. Chest x-ray yields hypoinflation with possible atelectasis especially in the right lung base. No overt evidence of CHF and no focal pneumonia. Urine significant for many bacteria too  numerous to count WBCs large leukocytes positive nitrites. In the emergency department she was given 1 L of normal saline intravenously one dose of Rocephin and Tylenol. We're asked to admit for further evaluation and treatment.   Review of Systems: 10 point review of systems completed and all systems negative except as indicated in the history of present illness  Past Medical History  Diagnosis Date  . Hypothyroidism   . Anxiety   . Arthritis     Bilateral Knee DJD  . Headache(784.0)     SINUS HEADACHE OCCASSIONALLY  . Vertigo    Past Surgical History  Procedure Laterality Date  . Joint replacement  11/08/3010    right total knee  . Tubal ligation  1982  . Total knee arthroplasty  11/07/2011    Procedure: TOTAL KNEE ARTHROPLASTY;  Surgeon: Nilda Simmer, MD;  Location: [facility] OR;  Service: Orthopedics;  Laterality: Left;  DR Thurston Hole WANTS 90 MINUTES FOR THIS CASE   Social History:  reports that she quit smoking about a year ago. Her smoking use included Cigarettes. She has a 48 pack-year smoking history. She does not have any smokeless tobacco history on file. She reports that she does not drink alcohol or use illicit drugs. Is married and lives with her husband. She is retired. Her baseline is occasionally walking with a walker. Allergies  Allergen Reactions  . Oxycodone Other (See Comments)    "CX HER TO FELL LIKE SHE IS OUT OF HER BODY"    Family History  Problem Relation Age of Onset  . Heart attack Mother   .  COPD Father   . Arthritis Sister   . Heart disease Brother      Prior to Admission medications   Medication Sig Start Date End Date Taking? Authorizing Provider  alendronate (FOSAMAX) 70 MG tablet Take 70 mg by mouth every 7 (seven) days.  04/05/13   Historical Provider, MD  ALPRAZolam Prudy Feeler) 1 MG tablet Take 1 mg by mouth at bedtime. Prescribed one tablet three times daily as needed For anxiety    Historical Provider, MD  atorvastatin (LIPITOR) 10 MG tablet Take 10  mg by mouth every morning.    Historical Provider, MD  cephALEXin (KEFLEX) 500 MG capsule Take 1 capsule (500 mg total) by mouth 4 (four) times daily. 04/26/13   Vida Roller, MD  fexofenadine (ALLEGRA) 180 MG tablet Take 180 mg by mouth once a week.    Historical Provider, MD  levothyroxine (SYNTHROID, LEVOTHROID) 100 MCG tablet Take 100 mcg by mouth every morning.     Historical Provider, MD  naproxen sodium (ALEVE) 220 MG tablet Take 440 mg by mouth at bedtime as needed (for pain).    Historical Provider, MD   Physical Exam: Filed Vitals:   05/27/13 1100  BP: 126/60  Pulse: 106  Temp:   Resp: 35     General:  Well-nourished slightly pale no acute  Eyes: Pupils equal round reactive to light no scleral icterus, EOMI  ENT: Ears are clear nose without drainage mucous membranes of her mouth are pink slightly dry  Neck: Supple no JVD full range of motion no lymphadenopathy  Cardiovascular: Tachycardic but regular no murmur no gallop no rub noted. Trace lower extremity edema pedal pulses present and palpable  Respiratory: Normal effort but increased rate. Breath sounds with mild expiratory wheeze anteriorly. Otherwise clear to auscultation  Abdomen: Obese soft positive bowel sounds mild tenderness throughout particularly in supra-pubic area  Skin: Warm and dry no rash or lesions noted  Musculoskeletal: No clubbing no cyanosis. Healed scar on left knee. No clubbing no cyanosis  Psychiatric: Calm cooperative somewhat flat  Neurologic: Cranial nerves II through XII grossly intact speech clear facial symmetry  Labs on Admission:  Basic Metabolic Panel:  Recent Labs Lab 05/27/13 0947  NA 137  K 3.5  CL 100  CO2 25  GLUCOSE 193*  BUN 16  CREATININE 0.82  CALCIUM 9.3   Liver Function Tests:  Recent Labs Lab 05/27/13 0947  AST 17  ALT 26  ALKPHOS 62  BILITOT 1.2  PROT 7.2  ALBUMIN 3.7   No results found for this basename: LIPASE, AMYLASE,  in the last 168  hours No results found for this basename: AMMONIA,  in the last 168 hours CBC:  Recent Labs Lab 05/27/13 0947  WBC 18.4*  NEUTROABS 15.9*  HGB 12.5  HCT 37.4  MCV 88.8  PLT 211   Cardiac Enzymes: No results found for this basename: CKTOTAL, CKMB, CKMBINDEX, TROPONINI,  in the last 168 hours  BNP (last 3 results) No results found for this basename: PROBNP,  in the last 8760 hours CBG: No results found for this basename: GLUCAP,  in the last 168 hours  Radiological Exams on Admission: No results found.  EKG: Independently reviewed yields sinus tachycardia  Assessment/Plan Principal Problem:   Sepsis: Related to urinary tract infection. Patient reports completing a course of Keflex several weeks ago for same. Will admit to medical floor. Patient currently hemodynamically stable. Will bolus with another liter of normal saline and keep maintenance fluids at 100  mils an hour. Will monitor vital signs every 4x3. Will provide Tylenol for fever. Blood cultures are pending. Continue Rocephin per pharmacy. Monitor closely Active Problems:  UTI (lower urinary tract infection); appears recurrent. Will get urine cultures. Will continue Rocephin per pharmacy. Will monitor intake and output. See #1   Tachycardia: Likely related to #1. Patient received 1 L of normal saline intravenously in the emergency department. Will bolus 1 more liter of normal saline and keep maintenance dose at 100 mils per hour. No cardiac history noted in chart. EKG yields normal sinus rhythm. Patient denies any chest pain. Will monitor      Tachypnea: Related to #1 and 2. Will provide oxygen supplementation as needed to maintain oxygen saturation level greater than 90%. Only mild wheezes on exam. Will monitor closely    Leukocytosis: Related to #1 and #2. Continue Rocephin per pharmacy. Will recheck in the a.m.    Hyperglycemia: No history of diabetes. Likely related to #1. Will check hemoglobin A1c. Will monitor CBGs  q. 8 hours x3.    Anxiety: Appears to be at baseline. Will continue home medication and monitor    Hypothyroidism: We'll check TSH and continue home Synthroid    Code Status: full Family Communication:husband at bedside Disposition Plan: home when ready likely 24-48 hours.  Time spent: 65 minutes  Gwenyth Bender [facility] Pager [phone]  If 7PM-7AM, please contact night-coverage [email or web address] Password [email or web address] 05/27/2013, 11:29 AM

## 2013-05-27 NOTE — ED Notes (Signed)
Patient with c/o weakness to legs on and off x 1 month. Patient states lower back pain x 3 months, NKI. Alert/oriented x 4. Denies loss of bowel/bladder control. States pain might be a factor in leg weakness.

## 2013-05-27 NOTE — ED Provider Notes (Signed)
CSN: [id number]     Arrival date & time 05/27/13  0907 History  This chart was scribed for Raeford Razor, MD by Bennett Scrape, ED Scribe. This patient was seen in room APA06/APA06 and the patient's care was started at 9:20 AM.   Chief Complaint  Patient presents with  . Weakness    The history is provided by the patient and the spouse. No language interpreter was used.    HPI Comments: Amanda Armstrong is a 67 y.o. female who presents to the Emergency Department complaining of generalized weakness, worse in the bilateral lower extremities, for the past 2 to 3 weeks.  She was seen on 04/26/13 for the similar symptoms after husband states that "she turned white" and could not support herself. A MRI of the brain was completed during the last visit but was motion degraded. She was discharged home with an UTI and was started on Keflex. She has been seen by her PCP in between the interim of her last visit and today and has an appointment with a neurologist. He states that the symptoms seemed to improve until she had a relapse last night around 11 PM. At baseline, she walks with a walker but last night had increased difficulty with using it and husband had to help support most of her weight. Pt states that she feels like she cannot ambulate in a straight line and has been frequently losing her balance since last night. She also c/o mid to lower back located across the middle and bilateral knee pain since the onset of the weakness. She reports an inability to bend down due to pain. She denies any urinary symptoms, nausea, known fevers or diarrhea.    Past Medical History  Diagnosis Date  . Hypothyroidism   . Anxiety   . Arthritis     Bilateral Knee DJD  . Headache(784.0)     SINUS HEADACHE OCCASSIONALLY  . Vertigo    Past Surgical History  Procedure Laterality Date  . Joint replacement  11/08/3010    right total knee  . Tubal ligation  1982  . Total knee arthroplasty  11/07/2011    Procedure:  TOTAL KNEE ARTHROPLASTY;  Surgeon: Nilda Simmer, MD;  Location: [facility] OR;  Service: Orthopedics;  Laterality: Left;  DR Thurston Hole WANTS 90 MINUTES FOR THIS CASE   Family History  Problem Relation Age of Onset  . Heart attack Mother   . COPD Father   . Arthritis Sister   . Heart disease Brother    History  Substance Use Topics  . Smoking status: Former Smoker -- 1.00 packs/day for 48 years    Types: Cigarettes    Quit date: 06/15/2012  . Smokeless tobacco: Not on file  . Alcohol Use: No   No OB history provided.  Review of Systems  Constitutional: Negative for fever.  Gastrointestinal: Negative for nausea.  Genitourinary: Negative for dysuria, urgency and decreased urine volume.  Musculoskeletal: Positive for arthralgias and back pain.  Neurological: Positive for weakness. Negative for facial asymmetry and numbness.  All other systems reviewed and are negative.    Allergies  Oxycodone  Home Medications   Current Outpatient Rx  Name  Route  Sig  Dispense  Refill  . alendronate (FOSAMAX) 70 MG tablet   Oral   Take 70 mg by mouth every 7 (seven) days.          . ALPRAZolam (XANAX) 1 MG tablet   Oral   Take 1 mg by mouth at  bedtime. Prescribed one tablet three times daily as needed For anxiety         . atorvastatin (LIPITOR) 10 MG tablet   Oral   Take 10 mg by mouth every morning.         . cephALEXin (KEFLEX) 500 MG capsule   Oral   Take 1 capsule (500 mg total) by mouth 4 (four) times daily.   28 capsule   0   . fexofenadine (ALLEGRA) 180 MG tablet   Oral   Take 180 mg by mouth once a week.         . levothyroxine (SYNTHROID, LEVOTHROID) 100 MCG tablet   Oral   Take 100 mcg by mouth every morning.          . naproxen sodium (ALEVE) 220 MG tablet   Oral   Take 440 mg by mouth at bedtime as needed (for pain).          Triage Vitals: BP 131/55  Pulse 120  Temp(Src) 98.4 F (36.9 C) (Oral)  Resp 25  Ht 4\' 10"  (1.473 m)  Wt 150 lb (68.04 kg)   BMI 31.36 kg/m2  SpO2 99%  Physical Exam  Nursing note and vitals reviewed. Constitutional: She is oriented to person, place, and time. She appears well-developed and well-nourished. No distress.  Tired appearing, Malodorous smell  HENT:  Head: Normocephalic and atraumatic.  Eyes: EOM are normal.  Neck: Normal range of motion.  Cardiovascular: Regular rhythm and normal heart sounds.  Tachycardia present.   Pulmonary/Chest: Breath sounds normal. Tachypnea noted.  Abdominal: Soft. She exhibits no distension. There is tenderness (mild suprapubic ). There is no rebound and no guarding.  CVA tenderness bilaterally  Musculoskeletal: Normal range of motion. She exhibits edema (trace lower extremity edema).  Neurological: She is alert and oriented to person, place, and time.  No nystagmus. Normal finger to nose. Equal grips. Strength intact in bilateral lower extremities.   Skin: Skin is warm and dry.  Psychiatric: She has a normal mood and affect. Judgment normal.    ED Course  Procedures (including critical care time)  Medications  sodium chloride 0.9 % bolus 500 mL (not administered)    DIAGNOSTIC STUDIES: Oxygen Saturation is 99% on room air, normal by my interpretation.    COORDINATION OF CARE: 9:27 AM-Informed pt that her symptoms are most likely from infection. Discussed treatment plan which includes UA with pt at bedside and pt agreed to plan.   10:15 AM-Pt rechecked and informed of UTI noted on UA. Discussed admission for IV fluids and antibiotics with pt and pt agreed. Pt appears tachypneic but denies any SOB. Oxy Sat is 96% on RA.  10:47 AM-Consult complete with Hospitalist. Patient case explained and discussed. Hospitalist agrees to admit patient for further evaluation and treatment. Aware that CXR is still pending. Call ended at 10:48 AM.  12:35 PM-Informed husband and daughter of UTI diagnosis and admission plans. Both are agreeable.   Labs Review Labs Reviewed   URINALYSIS, ROUTINE W REFLEX MICROSCOPIC - Abnormal; Notable for the following:    APPearance TURBID (*)    Hgb urine dipstick TRACE (*)    Ketones, ur 15 (*)    Protein, ur 30 (*)    Urobilinogen, UA >8.0 (*)    Nitrite POSITIVE (*)    Leukocytes, UA LARGE (*)    All other components within normal limits  COMPREHENSIVE METABOLIC PANEL - Abnormal; Notable for the following:    Glucose, Bld 193 (*)  GFR calc non Af Amer 71 (*)    GFR calc Af Amer 83 (*)    All other components within normal limits  CBC WITH DIFFERENTIAL - Abnormal; Notable for the following:    WBC 18.4 (*)    Neutrophils Relative % 86 (*)    Neutro Abs 15.9 (*)    Lymphocytes Relative 5 (*)    Monocytes Absolute 1.5 (*)    All other components within normal limits  URINE MICROSCOPIC-ADD ON - Abnormal; Notable for the following:    Bacteria, UA MANY (*)    All other components within normal limits  CG4 I-STAT (LACTIC ACID) - Abnormal; Notable for the following:    Lactic Acid, Venous 2.81 (*)    All other components within normal limits  CULTURE, BLOOD (ROUTINE X 2)  CULTURE, BLOOD (ROUTINE X 2)  URINE CULTURE   Imaging Review Ct Angio Chest Pe W/cm &/or Wo Cm  05/29/2013   CLINICAL DATA:  Weakness sepsis, abdominal pain, tachycardia.  EXAM: CT ANGIOGRAPHY CHEST, ABDOMEN AND PELVIS  TECHNIQUE: Multidetector CT imaging through the chest, abdomen and pelvis was performed using the standard protocol during bolus administration of intravenous contrast. Multiplanar reconstructed images including MIPs were obtained and reviewed to evaluate the vascular anatomy.  CONTRAST:  OMNIPAQUE IOHEXOL 350 MG/ML SOLN  FINDINGS: CTA CHEST FINDINGS  Satisfactory opacification of pulmonary arteries noted, and there is no evidence of pulmonary emboli. Patient breathing during the acquisition degrades some of the images. Adequate contrast opacification of the thoracic aorta with no evidence of dissection, aneurysm, or stenosis.  There is classic 3-vessel brachiocephalic arch anatomy without proximal stenosis. Nonocclusive plaque in the aortic arch and descending thoracic segment.  No pleural or pericardial effusion. Subcentimeter AP window, precarinal, and prevascular lymph nodes. No hilar adenopathy. Dependent consolidation/ atelectasis in the posterior and lateral basal segments right lower lobe and to a lesser degree in a in the posterior basal segment left lower lobe. Nonspecific noncalcified 5 mm nodule in the anterior right upper lobe adjacent to the minor fissure, image 36/5. Mild spondylitic change in the lower thoracic spine.  Review of the MIP images confirms the above findings.  CTA ABDOMEN AND PELVIS FINDINGS  Arterial findings:  Aorta: Mild plaque in the infrarenal segment. No aneurysm, dissection, or stenosis.  Celiac axis: Patent, unremarkable distal branching.  Superior mesenteric: Patent, classic distal branch anatomy.  Left renal: Single. There is a short segment stenosis approximately 1 cm beyond the ostium resulting in at least 75% diameter narrowing over short segment. Vessel widely patent distally.  Right renal: Single, patent.  Inferior mesenteric: Patent.  Left iliac: Eccentric nonocclusive plaque through the common iliac artery. Internal and external iliac arteries unremarkable.  Right iliac: Scattered nonocclusive plaque in the common iliac artery. Internal and external iliac arteries widely patent.  Venous findings: Dedicated venous phase imaging not obtained. Patent bilateral renal veins noted.  Review of the MIP images confirms the above findings.  Nonvascular findings: Diffuse fatty infiltration of the liver without focal lesion. Multiple subcentimeter partially calcified stones layer in the dependent aspect of the nondilated gallbladder. Unremarkable spleen 13 mm low-attenuation left adrenal nodule, stable. Unremarkable right adrenal gland, pancreas, right kidney. Poorly marginated areas of decreased  enhancement in the interpolar region of the left kidney ; no focal abscess. Mild perinephric inflammatory/ edematous changes. Small 8 mm fact containing lesion in the upper pole left kidney, stable. No hydronephrosis. Stomach, small bowel, and colon are nondilated. Normal appendix. Scattered distal  descending and sigmoid diverticula without adjacent inflammatory/ edematous change. 3.1 cm left adnexal mass with heavy coarse calcifications. Uterus and right adnexal region unremarkable. Urinary bladder physiologically distended. No ascites. No free air. No adenopathy. Degenerative disc disease L3-4 and L4-5.  IMPRESSION: CTA CHEST IMPRESSION  1. Negative for acute PE or thoracic aortic dissection. 2. Atelectasis/consolidation in the posterior and lateral basal segments right lower lobe.  CTA ABDOMEN AND PELVIS IMPRESSION  1. Proximal left renal artery stenosis of possible hemodynamic significance. 2. Abnormal parenchymal enhancement in the interpolar region of the left kidney suggesting pyelonephritis. Correlate with UA. 3. Negative for aortic aneurysm. 4. Cholelithiasis. 5. Fatty liver. 6. Descending and sigmoid diverticulosis.   Electronically Signed   By: Oley Balm M.D.   On: 05/29/2013 08:30   Dg Chest Port 1 View  05/29/2013   CLINICAL DATA:  Possible interstitial edema versus bronchitis  EXAM: PORTABLE CHEST - 1 VIEW  COMPARISON:  CT of 1 day prior.  Plain film of 2 days prior.  FINDINGS: Midline trachea. Normal heart size. Diminished lung volumes. No definite pleural fluid. No pneumothorax. Apparent interstitial prominence, felt to be a combination of chronic bronchitis accentuated by low lung volumes. Patchy subsegmental atelectasis at the bases, greater right than left.  IMPRESSION: Low lung volumes and mild interstitial thickening, likely due to chronic bronchitis. No overt congestive heart failure.  Diminished bibasilar aeration with developing atelectasis.   Electronically Signed   By: Jeronimo Greaves M.D.   On: 05/29/2013 08:06   Ct Angio Abd/pel W/ And/or W/o  05/28/2013   CLINICAL DATA:  Weakness sepsis, abdominal pain, tachycardia.  EXAM: CT ANGIOGRAPHY CHEST, ABDOMEN AND PELVIS  TECHNIQUE: Multidetector CT imaging through the chest, abdomen and pelvis was performed using the standard protocol during bolus administration of intravenous contrast. Multiplanar reconstructed images including MIPs were obtained and reviewed to evaluate the vascular anatomy.  CONTRAST:  OMNIPAQUE IOHEXOL 350 MG/ML SOLN  COMPARISON:  04/22/2009  FINDINGS: CTA CHEST FINDINGS  Satisfactory opacification of pulmonary arteries noted, and there is no evidence of pulmonary emboli. Patient breathing during the acquisition degrades some of the images. Adequate contrast opacification of the thoracic aorta with no evidence of dissection, aneurysm, or stenosis. There is classic 3-vessel brachiocephalic arch anatomy without proximal stenosis. Nonocclusive plaque in the aortic arch and descending thoracic segment.  No pleural or pericardial effusion. Subcentimeter AP window, precarinal, and prevascular lymph nodes. No hilar adenopathy. Dependent consolidation/ atelectasis in the posterior and lateral basal segments right lower lobe and to a lesser degree in a in the posterior basal segment left lower lobe. Nonspecific noncalcified 5 mm nodule in the anterior right upper lobe adjacent to the minor fissure, image 36/5. Mild spondylitic change in the lower thoracic spine.  Review of the MIP images confirms the above findings.  CTA ABDOMEN AND PELVIS FINDINGS  Arterial findings:  Aorta: Mild plaque in the infrarenal segment. No aneurysm, dissection, or stenosis.  Celiac axis:         Patent, unremarkable distal branching.  Superior mesenteric: Patent, classic distal branch anatomy.  Left renal: Single. There is a short segment stenosis approximately 1 cm beyond the ostium resulting in at least 75% diameter narrowing over short  segment. Vessel widely patent distally.  Right renal:         Single, patent.  Inferior mesenteric: Patent.  Left iliac: Eccentric nonocclusive plaque through the common iliac artery. Internal and external iliac arteries unremarkable.  Right iliac: Scattered nonocclusive plaque in  the common iliac artery. Internal and external iliac arteries widely patent.  Venous findings: Dedicated venous phase imaging not obtained. Patent bilateral renal veins noted.  Review of the MIP images confirms the above findings.  Nonvascular findings: Diffuse fatty infiltration of the liver without focal lesion. Multiple subcentimeter partially calcified stones layer in the dependent aspect of the nondilated gallbladder. Unremarkable spleen 13 mm low-attenuation left adrenal nodule, stable. Unremarkable right adrenal gland, pancreas, right kidney. Poorly marginated areas of decreased enhancement in the interpolar region of the left kidney ; no focal abscess. Mild perinephric inflammatory/ edematous changes. Small 8 mm fact containing lesion in the upper pole left kidney, stable. No hydronephrosis. Stomach, small bowel, and colon are nondilated. Normal appendix. Scattered distal descending and sigmoid diverticula without adjacent inflammatory/ edematous change. 3.1 cm left adnexal mass with heavy coarse calcifications. Uterus and right adnexal region unremarkable. Urinary bladder physiologically distended. No ascites. No free air. No adenopathy. Degenerative disc disease L3-4 and L4-5.  : IMPRESSION:  CTA CHEST IMPRESSION  1. Negative for acute PE or thoracic aortic dissection. 2. Atelectasis/consolidation in the posterior and lateral basal segments right lower lobe.  CTA ABDOMEN AND PELVIS IMPRESSION  1. Proximal left renal artery stenosis of possible hemodynamic significance. 2. Abnormal parenchymal enhancement in the interpolar region of the left kidney suggesting pyelonephritis. Correlate with UA. 3. Negative for aortic aneurysm. 4.  Cholelithiasis. 5. Fatty liver. 6. Descending and sigmoid diverticulosis.   Electronically Signed   By: Oley Balm M.D.   On: 05/28/2013 12:24    EKG Interpretation   None       MDM   1. Sepsis   2. UTI (urinary tract infection)    67 year old female with generalized weakness. Ongoing issue for past ~3 weeks. In process of being worked-up by PCP. Recent MRI brain w/o acute abnormality, but motion degraded. Presenting complaints today are an acute change though. Her neuro exam is nonfocal.   Current clinical picture consistent with sepsis. Has UTI. Febrile, leukocytosis and tachycardia. Mild elevation in lactic acid. Tired appearing, but mentation is fine. Renal function is okay and she is normotensive. This is the most likely explanation for acute weakness. Blood cultures were drawn. Rocephin ordered. IVF and antipyretics. PCP, Dr Sherwood Gambler. Given age and degree of symptoms, will admit for further tx.    Raeford Razor, MD 05/30/13 1104

## 2013-05-27 NOTE — H&P (Signed)
Patient seen and examined.  Agree with note as above.  Patient has been admitted with generalized weakness.  Symptoms felt to be related to sepsis from UTI.  She has been started on antibiotics and IV fluids.  She is wheezing, so will check chest xray and give nebulizer treatment.  Physical therapy evaluation.  Amanda Armstrong

## 2013-05-27 NOTE — Progress Notes (Signed)
UR Chart Review Completed  

## 2013-05-28 ENCOUNTER — Inpatient Hospital Stay (HOSPITAL_COMMUNITY): Payer: Medicare Other

## 2013-05-28 ENCOUNTER — Encounter (HOSPITAL_COMMUNITY): Payer: Self-pay | Admitting: Internal Medicine

## 2013-05-28 DIAGNOSIS — R0682 Tachypnea, not elsewhere classified: Secondary | ICD-10-CM

## 2013-05-28 DIAGNOSIS — N10 Acute tubulo-interstitial nephritis: Secondary | ICD-10-CM

## 2013-05-28 LAB — PRO B NATRIURETIC PEPTIDE: Pro B Natriuretic peptide (BNP): 1179 pg/mL — ABNORMAL HIGH (ref 0–125)

## 2013-05-28 LAB — BASIC METABOLIC PANEL
BUN: 14 mg/dL (ref 6–23)
Creatinine, Ser: 0.76 mg/dL (ref 0.50–1.10)
GFR calc Af Amer: >90 mL/min (ref >90)
GFR calc non Af Amer: 84 mL/min — ABNORMAL LOW (ref >90)
Glucose, Bld: 100 mg/dL — ABNORMAL HIGH (ref 70–99)
Potassium: 3.5 mEq/L (ref 3.5–5.1)

## 2013-05-28 LAB — GLUCOSE, CAPILLARY
Glucose-Capillary: 105 mg/dL — ABNORMAL HIGH (ref 70–99)
Glucose-Capillary: 135 mg/dL — ABNORMAL HIGH (ref 70–99)

## 2013-05-28 LAB — URINE CULTURE: Culture: >=100000

## 2013-05-28 LAB — HEMOGLOBIN A1C: Mean Plasma Glucose: 114 mg/dL (ref <117)

## 2013-05-28 LAB — CBC
Hemoglobin: 10.9 g/dL — ABNORMAL LOW (ref 12.0–15.0)
MCH: 29.1 pg (ref 26.0–34.0)
MCHC: 32.4 g/dL (ref 30.0–36.0)

## 2013-05-28 LAB — TSH: TSH: 0.025 u[IU]/mL — ABNORMAL LOW (ref 0.350–4.500)

## 2013-05-28 MED ORDER — FUROSEMIDE 10 MG/ML IJ SOLN
20.0000 mg | Freq: Once | INTRAMUSCULAR | Status: AC
  Administered 2013-05-28: 20 mg via INTRAVENOUS
  Filled 2013-05-28: qty 2

## 2013-05-28 MED ORDER — DEXTROSE 5 % IV SOLN
500.0000 mg | INTRAVENOUS | Status: DC
  Administered 2013-05-28 – 2013-05-29 (×2): 500 mg via INTRAVENOUS
  Filled 2013-05-28 (×2): qty 500

## 2013-05-28 MED ORDER — LEVALBUTEROL HCL 0.63 MG/3ML IN NEBU
0.6300 mg | INHALATION_SOLUTION | Freq: Four times a day (QID) | RESPIRATORY_TRACT | Status: DC
  Administered 2013-05-28 – 2013-05-31 (×9): 0.63 mg via RESPIRATORY_TRACT
  Filled 2013-05-28 (×10): qty 3

## 2013-05-28 MED ORDER — IOHEXOL 350 MG/ML SOLN
100.0000 mL | Freq: Once | INTRAVENOUS | Status: AC | PRN
  Administered 2013-05-28: 100 mL via INTRAVENOUS

## 2013-05-28 NOTE — Care Management Note (Signed)
    Page 1 of 1   05/31/2013     1:01:55 PM   CARE MANAGEMENT NOTE 05/31/2013  Patient:  [facility]   Account Number:  [id number]  Date Initiated:  05/28/2013  Documentation initiated by:  Rosemary Holms  Subjective/Objective Assessment:   Pt lives at home with her husband. Pt states she is open to [facility] if needed. Await PT eval.     Action/Plan:   Anticipated DC Date:  05/31/2013   Anticipated DC Plan:  SKILLED NURSING FACILITY  In-house referral  Clinical Social Worker      DC Planning Services  CM consult      Choice offered to / List presented to:             Status of service:  Completed, signed off Medicare Important Message given?  YES (If response is "NO", the following Medicare IM given date fields will be blank) Date Medicare IM given:  05/31/2013 Date Additional Medicare IM given:    Discharge Disposition:  SKILLED NURSING FACILITY  Per UR Regulation:    If discussed at Long Length of Stay Meetings, dates discussed:    Comments:  05/28/13 Rosemary Holms RN BSN CM

## 2013-05-28 NOTE — Progress Notes (Signed)
[facility] PROGRESS NOTE  Amanda Armstrong ZOX:[id number] DOB: Jun 17, 1946 DOA: 05/27/2013 PCP: [name], MD  Assessment/Plan: Sepsis: Related to acute pyelonephritis. CT consistent with pyelonephritis. BP stable. Remains mildly tachycardic with tachypnea. Max temp 102.1 rectally. Somewhat lethargic. Will continue fluids and antibiotics day #2. Will provide Tylenol for fever. Will repeat blood cultures. Previous cultures no growth to date. Continue Rocephin per pharmacy. If she continues to have fevers over the next 2-3 days, can consider pursuing a renal ultrasound to evaluate for developing abscess. Monitor closely   Acute pyelonephritis.  Urine cultures with E. Coli.  Will continue Rocephin per pharmacy. Will monitor intake and output. See #1   Tachycardia: Likely related to #1. HR range 109-115.  CT chest negative for PE.  No cardiac history noted in chart. EKG yields normal sinus rhythm. Patient denies any chest pain. Will monitor   Tachypnea with atelectasis vs. Infiltrate in RLL on chest xray.  Will encourage incentive spirometry.  Will add azithromycin to cover for CAP. BNP is elevated as well.  Give small dose of lasix and cautious with hydration. Continue neb treatments.   Leukocytosis: Related to #1 and #2. Trending downward.  Continue Rocephin per pharmacy. Will recheck in the a.m.   Hyperglycemia: No history of diabetes. Likely related to #1. Hemoglobin A1c 5.6. Will monitor CBGs q. 8 hours x3.   Anxiety: Somewhat lethargic. Will reduce sedating meds.  Will continue home medication and monitor   Hypothyroidism: TSH 0.025. Continue home Synthroid.  Repeat once acute illness has resolved.  Code Status: full  Communication: none present Disposition Plan: home when ready   Consultants:  none  Procedures:  none  Antibiotics:  Rocephin 05/27/13  Azithro 05/28/13  HPI/Subjective: Awake but lethargic. Denies pain/discomfort.   Objective: Filed Vitals:    05/28/13 0554  BP: 111/52  Pulse: 109  Temp: 99.7 F (37.6 C)  Resp: 20    Intake/Output Summary (Last 24 hours) at 05/28/13 1000 Last data filed at 05/28/13 0904  Gross per 24 hour  Intake   1220 ml  Output      0 ml  Net   1220 ml   Filed Weights   05/27/13 0916 05/27/13 1352  Weight: 68.04 kg (150 lb) 68.947 kg (152 lb)    Exam:   General:  Well nourished NAD  Cardiovascular: tachycardic but regular. No MGR trace LE edema  Respiratory: mild increased work of breathing. Fair air flow. Faint wheeze.   Abdomen: obese soft +BS non-tender to palpation  Musculoskeletal: no clubbing no cyanosis   Data Reviewed: Basic Metabolic Panel:  Recent Labs Lab 05/27/13 0947 05/28/13 0545  NA 137 140  K 3.5 3.5  CL 100 109  CO2 25 23  GLUCOSE 193* 100*  BUN 16 14  CREATININE 0.82 0.76  CALCIUM 9.3 8.4   Liver Function Tests:  Recent Labs Lab 05/27/13 0947  AST 17  ALT 26  ALKPHOS 62  BILITOT 1.2  PROT 7.2  ALBUMIN 3.7   No results found for this basename: LIPASE, AMYLASE,  in the last 168 hours No results found for this basename: AMMONIA,  in the last 168 hours CBC:  Recent Labs Lab 05/27/13 0947 05/28/13 0545  WBC 18.4* 13.7*  NEUTROABS 15.9*  --   HGB 12.5 10.9*  HCT 37.4 33.6*  MCV 88.8 89.8  PLT 211 193   Cardiac Enzymes: No results found for this basename: CKTOTAL, CKMB, CKMBINDEX, TROPONINI,  in the last 168 hours BNP (last 3  results) No results found for this basename: PROBNP,  in the last 8760 hours CBG:  Recent Labs Lab 05/27/13 2353 05/28/13 0757  GLUCAP 105* 86    Recent Results (from the past 240 hour(s))  URINE CULTURE     Status: None   Collection Time    05/27/13  9:42 AM      Result Value Range Status   Specimen Description URINE, CATHETERIZED   Final   Special Requests NONE   Final   Culture  Setup Time     Final   Value: 05/27/2013 14:02     Performed at [facility]   Culture     Final   Value:  >=100,000 COLONIES/mL ESCHERICHIA COLI     Performed at [facility]   Report Status PENDING   Incomplete  CULTURE, BLOOD (ROUTINE X 2)     Status: None   Collection Time    05/27/13 10:05 AM      Result Value Range Status   Specimen Description Blood RIGHT ARM   Final   Special Requests BOTTLES DRAWN AEROBIC AND ANAEROBIC 8 CC EACH   Final   Culture NO GROWTH 1 DAY   Final   Report Status PENDING   Incomplete  CULTURE, BLOOD (ROUTINE X 2)     Status: None   Collection Time    05/27/13 10:12 AM      Result Value Range Status   Specimen Description Blood RIGHT HAND   Final   Special Requests BOTTLES DRAWN AEROBIC ONLY 6 CC   Final   Culture NO GROWTH 1 DAY   Final   Report Status PENDING   Incomplete     Studies: Dg Chest 2 View  05/27/2013   CLINICAL DATA:  Tachypnea and dizziness  EXAM: CHEST  2 VIEW  COMPARISON:  Chest x-ray of November 01, 2011.  FINDINGS: The lungs are borderline hypoinflated. The interstitial markings are slightly increased. The cardiac silhouette is normal in size and contour. There is mild tortuosity of the descending thoracic aorta. The perihilar lung markings are mildly prominent but are not entirely new. The gas pattern in the upper abdomen is nonspecific. The bony structures exhibit no acute abnormalities. There is degenerative disk space narrowing in the mid thoracic spine.  IMPRESSION: 1. Hypo inflation limits the study. I cannot exclude subsegmental atelectasis especially in the right lung base. 2. There is no overt evidence of CHF and there is no focal pneumonia demonstrated. .   Electronically Signed   By: David  [place]   On: 05/27/2013 11:29   Dg Chest Port 1 View  05/27/2013   CLINICAL DATA:  Increase wheezing tonight  EXAM: PORTABLE CHEST - 1 VIEW  COMPARISON:  Chest radiograph 05/27/2013.  FINDINGS: Normal cardiac silhouette. There is a mild interstitial pattern which is slightly increased compared prior. No focal consolidation. No pneumothorax.   IMPRESSION: Fine interstitial pattern could represent bronchiolitis or interstitial edema.   Electronically Signed   By: Genevive Bi M.D.   On: 05/27/2013 19:58    Scheduled Meds: . ALPRAZolam  1 mg Oral QHS  . atorvastatin  10 mg Oral q1800  . cefTRIAXone (ROCEPHIN)  IV  1 g Intravenous Q24H  . enoxaparin (LOVENOX) injection  40 mg Subcutaneous Q24H  . levothyroxine  100 mcg Oral QAC breakfast  . loratadine  10 mg Oral Daily   Continuous Infusions: . sodium chloride 100 mL/hr at 05/28/13 5784    Principal Problem:   Sepsis Active  Problems:   Anxiety   Hypothyroidism   Tachycardia   UTI (lower urinary tract infection)   Tachypnea   Leukocytosis   Hyperglycemia   Time spent: 35 minutes    [facility] M  [facility] Pager [phone]. If 7PM-7AM, please contact night-coverage at [email or web address], password [email or web address] 05/28/2013, 10:00 AM  LOS: 1 day    Attending note:  Patient seen and examined.  Above note reviewed and amended.  Patient is admitted with sepsis due to pyelonephritis. Urine culture is growing E coli.  She is on rocephin.  She does appear to be tachypneic. BNP is mildly elevated and chest xray shows RLL atelectasis vs. Infiltrate.  Will add azithromycin to antibiotics for now.  Monitor resp status closely.  MEMON,JEHANZEB

## 2013-05-28 NOTE — Plan of Care (Signed)
Problem: Phase I Progression Outcomes Goal: Voiding-avoid urinary catheter unless indicated Outcome: Not Progressing Foley to be placed for strict I&O and IV diuresis

## 2013-05-29 ENCOUNTER — Inpatient Hospital Stay (HOSPITAL_COMMUNITY): Payer: Medicare Other

## 2013-05-29 LAB — CBC WITH DIFFERENTIAL/PLATELET
Basophils Absolute: 0 10*3/uL (ref 0.0–0.1)
Basophils Relative: 0 % (ref 0–1)
Eosinophils Relative: 1 % (ref 0–5)
HCT: 32.1 % — ABNORMAL LOW (ref 36.0–46.0)
Hemoglobin: 10.2 g/dL — ABNORMAL LOW (ref 12.0–15.0)
Lymphocytes Relative: 17 % (ref 12–46)
Lymphs Abs: 1.6 10*3/uL (ref 0.7–4.0)
MCHC: 31.8 g/dL (ref 30.0–36.0)
MCV: 88.9 fL (ref 78.0–100.0)
Monocytes Absolute: 0.9 10*3/uL (ref 0.1–1.0)
Neutrophils Relative %: 73 % (ref 43–77)
Platelets: 193 10*3/uL (ref 150–400)
RBC: 3.61 MIL/uL — ABNORMAL LOW (ref 3.87–5.11)
RDW: 14.6 % (ref 11.5–15.5)
WBC: 9.9 10*3/uL (ref 4.0–10.5)

## 2013-05-29 LAB — COMPREHENSIVE METABOLIC PANEL
AST: 30 U/L (ref 0–37)
Albumin: 2.7 g/dL — ABNORMAL LOW (ref 3.5–5.2)
Calcium: 8.5 mg/dL (ref 8.4–10.5)
Creatinine, Ser: 0.72 mg/dL (ref 0.50–1.10)
GFR calc non Af Amer: 86 mL/min — ABNORMAL LOW (ref >90)
Sodium: 141 mEq/L (ref 135–145)
Total Protein: 6.3 g/dL (ref 6.0–8.3)

## 2013-05-29 LAB — GLUCOSE, CAPILLARY: Glucose-Capillary: 101 mg/dL — ABNORMAL HIGH (ref 70–99)

## 2013-05-29 MED ORDER — POTASSIUM CHLORIDE CRYS ER 20 MEQ PO TBCR
40.0000 meq | EXTENDED_RELEASE_TABLET | Freq: Once | ORAL | Status: AC
  Administered 2013-05-29: 40 meq via ORAL
  Filled 2013-05-29: qty 2

## 2013-05-29 MED ORDER — DM-GUAIFENESIN ER 30-600 MG PO TB12
1.0000 | ORAL_TABLET | Freq: Two times a day (BID) | ORAL | Status: DC
  Administered 2013-05-29 – 2013-05-31 (×5): 1 via ORAL
  Filled 2013-05-29 (×5): qty 1

## 2013-05-29 NOTE — Progress Notes (Signed)
ANTIBIOTIC CONSULT NOTE   Pharmacy Consult for Rocephin Indication: UTI  Allergies  Allergen Reactions  . Oxycodone Other (See Comments)    "CX HER TO FELL LIKE SHE IS OUT OF HER BODY"   Patient Measurements: Height: 4' 10.5" (148.6 cm) Weight: 152 lb (68.947 kg) IBW/kg (Calculated) : 42.05  Vital Signs: Temp: 98.5 F (36.9 C) (11/12 0454) Temp src: Oral (11/12 0454) BP: 108/62 mmHg (11/12 0454) Pulse Rate: 99 (11/12 0454) Intake/Output from previous day: 11/11 0701 - 11/12 0700 In: 480 [P.O.:480] Out: 1875 [Urine:1875] Intake/Output from this shift:    Labs:  Recent Labs  05/27/13 0947 05/28/13 0545 05/29/13 0617  WBC 18.4* 13.7* 9.9  HGB 12.5 10.9* 10.2*  PLT 211 193 193  CREATININE 0.82 0.76 0.72   Estimated Creatinine Clearance: 55.3 ml/min (by C-G formula based on Cr of 0.72). No results found for this basename: VANCOTROUGH, VANCOPEAK, VANCORANDOM, GENTTROUGH, GENTPEAK, GENTRANDOM, TOBRATROUGH, TOBRAPEAK, TOBRARND, AMIKACINPEAK, AMIKACINTROU, AMIKACIN,  in the last 72 hours   Microbiology: Recent Results (from the past 720 hour(s))  URINE CULTURE     Status: None   Collection Time    05/27/13  9:42 AM      Result Value Range Status   Specimen Description URINE, CATHETERIZED   Final   Special Requests NONE   Final   Culture  Setup Time     Final   Value: 05/27/2013 14:02     Performed at [facility]   Culture     Final   Value: >=100,000 COLONIES/mL ESCHERICHIA COLI     Performed at [facility]   Report Status 05/28/2013 FINAL   Final   Organism ID, Bacteria ESCHERICHIA COLI   Final  CULTURE, BLOOD (ROUTINE X 2)     Status: None   Collection Time    05/27/13 10:05 AM      Result Value Range Status   Specimen Description Blood RIGHT ARM   Final   Special Requests BOTTLES DRAWN AEROBIC AND ANAEROBIC 8 CC EACH   Final   Culture NO GROWTH 1 DAY   Final   Report Status PENDING   Incomplete  CULTURE, BLOOD (ROUTINE X 2)     Status:  None   Collection Time    05/27/13 10:12 AM      Result Value Range Status   Specimen Description Blood RIGHT HAND   Final   Special Requests BOTTLES DRAWN AEROBIC ONLY 6 CC   Final   Culture NO GROWTH 1 DAY   Final   Report Status PENDING   Incomplete   Medical History: Past Medical History  Diagnosis Date  . Hypothyroidism   . Anxiety   . Arthritis     Bilateral Knee DJD  . Headache(784.0)     SINUS HEADACHE OCCASSIONALLY  . Vertigo    Medications:  Scheduled:  . ALPRAZolam  1 mg Oral QHS  . atorvastatin  10 mg Oral q1800  . azithromycin  500 mg Intravenous Q24H  . cefTRIAXone (ROCEPHIN)  IV  1 g Intravenous Q24H  . enoxaparin (LOVENOX) injection  40 mg Subcutaneous Q24H  . levalbuterol  0.63 mg Nebulization Q6H  . levothyroxine  100 mcg Oral QAC breakfast  . loratadine  10 mg Oral Daily  . potassium chloride  40 mEq Oral Once   Assessment: 67yo female admitted with UTI and weakness.  Estimated Creatinine Clearance: 55.3 ml/min (by C-G formula based on Cr of 0.72).  Pt has E Coli in urine that  is sensitive to Rocephin.  WBC has normalized.    Report Status 05/28/2013 FINAL    Organism ID, Bacteria ESCHERICHIA COLI    Resulting Agency [facility]   Culture & Susceptibility    Antibiotic  Organism Organism Organism     ESCHERICHIA COLI     AMPICILLIN  <=2 SENSITIVE S Final       CEFAZOLIN  <=4 SENSITIVE S Final       CEFTRIAXONE  <=1 SENSITIVE S Final       CIPROFLOXACIN  <=0.25 SENSITIVE S Final       GENTAMICIN  <=1 SENSITIVE S Final       LEVOFLOXACIN  <=0.12 SENSITIVE S Final       NITROFURANTOIN  <=16 SENSITIVE S Final       PIP/TAZO  <=4 SENSITIVE S Final       TOBRAMYCIN  <=1 SENSITIVE S Final       TRIMETH/SULFA  <=20 SENSITIVE S Final       Goal of Therapy:  Eradicate infection.  Plan:  Rocephin 1gm IV q24h Pharmacy will sign off.  Valrie Hart A 05/29/2013,11:32 AM

## 2013-05-29 NOTE — Progress Notes (Signed)
[facility] PROGRESS NOTE  Amanda Armstrong ZOX:[id number] DOB: 09-13-1945 DOA: 05/27/2013 PCP: [name], MD  Assessment/Plan: Sepsis: Related to acute pyelonephritis. CT consistent with pyelonephritis on 05/28/13. Improved today. Max temp 99.3 orally. Leukocytosis resolved. BP remains stable. Tachycardia improving. Normal respiratory effort. Rocephin day  #3 and azithromycin day #2.Marland Kitchen Repeat blood cultures no growth to date. Previous cultures no growth to date. Monitor closely   Acute pyelonephritis. Urine cultures with E. Coli. Will continue antibiotics as above. Urine output 1.8L last 24 hours.    Tachycardia: Likely related to #1. HR range 99-105. CT chest negative for PE. No cardiac history noted in chart. EKG yields normal sinus rhythm. Patient denies any chest pain. Will monitor   Tachypnea with atelectasis vs. Infiltrate in RLL on chest xray.  Repeat chest xray yields low lung volumes and mild interstitial thickening, likely due to chronic bronchitis. No overt congestive heart failure. Diminished bibasilar aeration due to developing atelectasis.  Continue  incentive spirometry. Antibiotic as above. BNP was elevated 11/11 and give 20mg  IV lasix with output 1.8L of urine. Continue neb treatments and mucinex.  Leukocytosis: Related to #1 and #2. Resolved  Hypokalemia: mild. Related to lasix. Will replete and recheck   Hyperglycemia: No history of diabetes. Likely related to #1. Hemoglobin A1c 5.6. CBG 101 this am.   Anxiety: More alert.  Will continue home medication and monitor  Hypothyroidism: TSH 0.025. Continue home Synthroid. Repeat once acute illness has resolved.     Code Status: full Family Communication: none present Disposition Plan: home when ready   Consultants:  none  Procedures:  none  Antibiotics:  Rocephin 05/27/13>>  Azithromycin 05/28/13>>>>  HPI/Subjective: Awake reports feeling "a little better". Denies  pain/discomfort  Objective: Filed Vitals:   05/29/13 0454  BP: 108/62  Pulse: 99  Temp: 98.5 F (36.9 C)  Resp: 20    Intake/Output Summary (Last 24 hours) at 05/29/13 0937 Last data filed at 05/29/13 8119  Gross per 24 hour  Intake    240 ml  Output   1875 ml  Net  -1635 ml   Filed Weights   05/27/13 0916 05/27/13 1352  Weight: 68.04 kg (150 lb) 68.947 kg (152 lb)    Exam:   General:  Obese NAD  Cardiovascular: RRR No MGR trace LE edema  Respiratory: normal effort BS diminished in bases. No crackles no wheeze  Abdomen: obese soft +BS non-tender to palpation  Musculoskeletal: no clubbing no cyanosis   Data Reviewed: Basic Metabolic Panel:  Recent Labs Lab 05/27/13 0947 05/28/13 0545 05/29/13 0617  NA 137 140 141  K 3.5 3.5 3.3*  CL 100 109 107  CO2 25 23 26   GLUCOSE 193* 100* 100*  BUN 16 14 11   CREATININE 0.82 0.76 0.72  CALCIUM 9.3 8.4 8.5   Liver Function Tests:  Recent Labs Lab 05/27/13 0947 05/29/13 0617  AST 17 30  ALT 26 36*  ALKPHOS 62 64  BILITOT 1.2 0.6  PROT 7.2 6.3  ALBUMIN 3.7 2.7*   No results found for this basename: LIPASE, AMYLASE,  in the last 168 hours No results found for this basename: AMMONIA,  in the last 168 hours CBC:  Recent Labs Lab 05/27/13 0947 05/28/13 0545 05/29/13 0617  WBC 18.4* 13.7* 9.9  NEUTROABS 15.9*  --  7.2  HGB 12.5 10.9* 10.2*  HCT 37.4 33.6* 32.1*  MCV 88.8 89.8 88.9  PLT 211 193 193   Cardiac Enzymes: No results found for this basename: CKTOTAL,  CKMB, CKMBINDEX, TROPONINI,  in the last 168 hours BNP (last 3 results)  Recent Labs  05/28/13 0545  PROBNP 1179.0*   CBG:  Recent Labs Lab 05/27/13 2353 05/28/13 0757 05/28/13 1637 05/28/13 2224 05/29/13 0734  GLUCAP 105* 86 126* 135* 101*    Recent Results (from the past 240 hour(s))  URINE CULTURE     Status: None   Collection Time    05/27/13  9:42 AM      Result Value Range Status   Specimen Description URINE,  CATHETERIZED   Final   Special Requests NONE   Final   Culture  Setup Time     Final   Value: 05/27/2013 14:02     Performed at [facility]   Culture     Final   Value: >=100,000 COLONIES/mL ESCHERICHIA COLI     Performed at [facility]   Report Status 05/28/2013 FINAL   Final   Organism ID, Bacteria ESCHERICHIA COLI   Final  CULTURE, BLOOD (ROUTINE X 2)     Status: None   Collection Time    05/27/13 10:05 AM      Result Value Range Status   Specimen Description Blood RIGHT ARM   Final   Special Requests BOTTLES DRAWN AEROBIC AND ANAEROBIC 8 CC EACH   Final   Culture NO GROWTH 1 DAY   Final   Report Status PENDING   Incomplete  CULTURE, BLOOD (ROUTINE X 2)     Status: None   Collection Time    05/27/13 10:12 AM      Result Value Range Status   Specimen Description Blood RIGHT HAND   Final   Special Requests BOTTLES DRAWN AEROBIC ONLY 6 CC   Final   Culture NO GROWTH 1 DAY   Final   Report Status PENDING   Incomplete     Studies: Dg Chest 2 View  05/27/2013   CLINICAL DATA:  Tachypnea and dizziness  EXAM: CHEST  2 VIEW  COMPARISON:  Chest x-ray of November 01, 2011.  FINDINGS: The lungs are borderline hypoinflated. The interstitial markings are slightly increased. The cardiac silhouette is normal in size and contour. There is mild tortuosity of the descending thoracic aorta. The perihilar lung markings are mildly prominent but are not entirely new. The gas pattern in the upper abdomen is nonspecific. The bony structures exhibit no acute abnormalities. There is degenerative disk space narrowing in the mid thoracic spine.  IMPRESSION: 1. Hypo inflation limits the study. I cannot exclude subsegmental atelectasis especially in the right lung base. 2. There is no overt evidence of CHF and there is no focal pneumonia demonstrated. .   Electronically Signed   By: David  [place]   On: 05/27/2013 11:29   Ct Angio Chest Pe W/cm &/or Wo Cm  05/29/2013   CLINICAL DATA:  Weakness  sepsis, abdominal pain, tachycardia.  EXAM: CT ANGIOGRAPHY CHEST, ABDOMEN AND PELVIS  TECHNIQUE: Multidetector CT imaging through the chest, abdomen and pelvis was performed using the standard protocol during bolus administration of intravenous contrast. Multiplanar reconstructed images including MIPs were obtained and reviewed to evaluate the vascular anatomy.  CONTRAST:  OMNIPAQUE IOHEXOL 350 MG/ML SOLN  FINDINGS: CTA CHEST FINDINGS  Satisfactory opacification of pulmonary arteries noted, and there is no evidence of pulmonary emboli. Patient breathing during the acquisition degrades some of the images. Adequate contrast opacification of the thoracic aorta with no evidence of dissection, aneurysm, or stenosis. There is classic 3-vessel brachiocephalic arch anatomy  without proximal stenosis. Nonocclusive plaque in the aortic arch and descending thoracic segment.  No pleural or pericardial effusion. Subcentimeter AP window, precarinal, and prevascular lymph nodes. No hilar adenopathy. Dependent consolidation/ atelectasis in the posterior and lateral basal segments right lower lobe and to a lesser degree in a in the posterior basal segment left lower lobe. Nonspecific noncalcified 5 mm nodule in the anterior right upper lobe adjacent to the minor fissure, image 36/5. Mild spondylitic change in the lower thoracic spine.  Review of the MIP images confirms the above findings.  CTA ABDOMEN AND PELVIS FINDINGS  Arterial findings:  Aorta: Mild plaque in the infrarenal segment. No aneurysm, dissection, or stenosis.  Celiac axis: Patent, unremarkable distal branching.  Superior mesenteric: Patent, classic distal branch anatomy.  Left renal: Single. There is a short segment stenosis approximately 1 cm beyond the ostium resulting in at least 75% diameter narrowing over short segment. Vessel widely patent distally.  Right renal: Single, patent.  Inferior mesenteric: Patent.  Left iliac: Eccentric nonocclusive plaque  through the common iliac artery. Internal and external iliac arteries unremarkable.  Right iliac: Scattered nonocclusive plaque in the common iliac artery. Internal and external iliac arteries widely patent.  Venous findings: Dedicated venous phase imaging not obtained. Patent bilateral renal veins noted.  Review of the MIP images confirms the above findings.  Nonvascular findings: Diffuse fatty infiltration of the liver without focal lesion. Multiple subcentimeter partially calcified stones layer in the dependent aspect of the nondilated gallbladder. Unremarkable spleen 13 mm low-attenuation left adrenal nodule, stable. Unremarkable right adrenal gland, pancreas, right kidney. Poorly marginated areas of decreased enhancement in the interpolar region of the left kidney ; no focal abscess. Mild perinephric inflammatory/ edematous changes. Small 8 mm fact containing lesion in the upper pole left kidney, stable. No hydronephrosis. Stomach, small bowel, and colon are nondilated. Normal appendix. Scattered distal descending and sigmoid diverticula without adjacent inflammatory/ edematous change. 3.1 cm left adnexal mass with heavy coarse calcifications. Uterus and right adnexal region unremarkable. Urinary bladder physiologically distended. No ascites. No free air. No adenopathy. Degenerative disc disease L3-4 and L4-5.  IMPRESSION: CTA CHEST IMPRESSION  1. Negative for acute PE or thoracic aortic dissection. 2. Atelectasis/consolidation in the posterior and lateral basal segments right lower lobe.  CTA ABDOMEN AND PELVIS IMPRESSION  1. Proximal left renal artery stenosis of possible hemodynamic significance. 2. Abnormal parenchymal enhancement in the interpolar region of the left kidney suggesting pyelonephritis. Correlate with UA. 3. Negative for aortic aneurysm. 4. Cholelithiasis. 5. Fatty liver. 6. Descending and sigmoid diverticulosis.   Electronically Signed   By: Oley Balm M.D.   On: 05/29/2013 08:30   Dg  Chest Port 1 View  05/29/2013   CLINICAL DATA:  Possible interstitial edema versus bronchitis  EXAM: PORTABLE CHEST - 1 VIEW  COMPARISON:  CT of 1 day prior.  Plain film of 2 days prior.  FINDINGS: Midline trachea. Normal heart size. Diminished lung volumes. No definite pleural fluid. No pneumothorax. Apparent interstitial prominence, felt to be a combination of chronic bronchitis accentuated by low lung volumes. Patchy subsegmental atelectasis at the bases, greater right than left.  IMPRESSION: Low lung volumes and mild interstitial thickening, likely due to chronic bronchitis. No overt congestive heart failure.  Diminished bibasilar aeration with developing atelectasis.   Electronically Signed   By: Jeronimo Greaves M.D.   On: 05/29/2013 08:06   Dg Chest Port 1 View  05/27/2013   CLINICAL DATA:  Increase wheezing tonight  EXAM: PORTABLE  CHEST - 1 VIEW  COMPARISON:  Chest radiograph 05/27/2013.  FINDINGS: Normal cardiac silhouette. There is a mild interstitial pattern which is slightly increased compared prior. No focal consolidation. No pneumothorax.  IMPRESSION: Fine interstitial pattern could represent bronchiolitis or interstitial edema.   Electronically Signed   By: Genevive Bi M.D.   On: 05/27/2013 19:58   Ct Angio Abd/pel W/ And/or W/o  05/28/2013   CLINICAL DATA:  Weakness sepsis, abdominal pain, tachycardia.  EXAM: CT ANGIOGRAPHY CHEST, ABDOMEN AND PELVIS  TECHNIQUE: Multidetector CT imaging through the chest, abdomen and pelvis was performed using the standard protocol during bolus administration of intravenous contrast. Multiplanar reconstructed images including MIPs were obtained and reviewed to evaluate the vascular anatomy.  CONTRAST:  OMNIPAQUE IOHEXOL 350 MG/ML SOLN  COMPARISON:  04/22/2009  FINDINGS: CTA CHEST FINDINGS  Satisfactory opacification of pulmonary arteries noted, and there is no evidence of pulmonary emboli. Patient breathing during the acquisition degrades some of the  images. Adequate contrast opacification of the thoracic aorta with no evidence of dissection, aneurysm, or stenosis. There is classic 3-vessel brachiocephalic arch anatomy without proximal stenosis. Nonocclusive plaque in the aortic arch and descending thoracic segment.  No pleural or pericardial effusion. Subcentimeter AP window, precarinal, and prevascular lymph nodes. No hilar adenopathy. Dependent consolidation/ atelectasis in the posterior and lateral basal segments right lower lobe and to a lesser degree in a in the posterior basal segment left lower lobe. Nonspecific noncalcified 5 mm nodule in the anterior right upper lobe adjacent to the minor fissure, image 36/5. Mild spondylitic change in the lower thoracic spine.  Review of the MIP images confirms the above findings.  CTA ABDOMEN AND PELVIS FINDINGS  Arterial findings:  Aorta: Mild plaque in the infrarenal segment. No aneurysm, dissection, or stenosis.  Celiac axis:         Patent, unremarkable distal branching.  Superior mesenteric: Patent, classic distal branch anatomy.  Left renal: Single. There is a short segment stenosis approximately 1 cm beyond the ostium resulting in at least 75% diameter narrowing over short segment. Vessel widely patent distally.  Right renal:         Single, patent.  Inferior mesenteric: Patent.  Left iliac: Eccentric nonocclusive plaque through the common iliac artery. Internal and external iliac arteries unremarkable.  Right iliac: Scattered nonocclusive plaque in the common iliac artery. Internal and external iliac arteries widely patent.  Venous findings: Dedicated venous phase imaging not obtained. Patent bilateral renal veins noted.  Review of the MIP images confirms the above findings.  Nonvascular findings: Diffuse fatty infiltration of the liver without focal lesion. Multiple subcentimeter partially calcified stones layer in the dependent aspect of the nondilated gallbladder. Unremarkable spleen 13 mm low-attenuation  left adrenal nodule, stable. Unremarkable right adrenal gland, pancreas, right kidney. Poorly marginated areas of decreased enhancement in the interpolar region of the left kidney ; no focal abscess. Mild perinephric inflammatory/ edematous changes. Small 8 mm fact containing lesion in the upper pole left kidney, stable. No hydronephrosis. Stomach, small bowel, and colon are nondilated. Normal appendix. Scattered distal descending and sigmoid diverticula without adjacent inflammatory/ edematous change. 3.1 cm left adnexal mass with heavy coarse calcifications. Uterus and right adnexal region unremarkable. Urinary bladder physiologically distended. No ascites. No free air. No adenopathy. Degenerative disc disease L3-4 and L4-5.  : IMPRESSION:  CTA CHEST IMPRESSION  1. Negative for acute PE or thoracic aortic dissection. 2. Atelectasis/consolidation in the posterior and lateral basal segments right lower lobe.  CTA ABDOMEN  AND PELVIS IMPRESSION  1. Proximal left renal artery stenosis of possible hemodynamic significance. 2. Abnormal parenchymal enhancement in the interpolar region of the left kidney suggesting pyelonephritis. Correlate with UA. 3. Negative for aortic aneurysm. 4. Cholelithiasis. 5. Fatty liver. 6. Descending and sigmoid diverticulosis.   Electronically Signed   By: Oley Balm M.D.   On: 05/28/2013 12:24    Scheduled Meds: . ALPRAZolam  1 mg Oral QHS  . atorvastatin  10 mg Oral q1800  . azithromycin  500 mg Intravenous Q24H  . cefTRIAXone (ROCEPHIN)  IV  1 g Intravenous Q24H  . enoxaparin (LOVENOX) injection  40 mg Subcutaneous Q24H  . levalbuterol  0.63 mg Nebulization Q6H  . levothyroxine  100 mcg Oral QAC breakfast  . loratadine  10 mg Oral Daily   Continuous Infusions: . sodium chloride 50 mL/hr at 05/28/13 1421    Principal Problem:   Sepsis Active Problems:   Anxiety   Hypothyroidism   Tachycardia   UTI (lower urinary tract infection)   Tachypnea   Leukocytosis    Hyperglycemia   Acute pyelonephritis    Time spent: 35 minutes    [facility] M  [facility] Pager [phone]. If 7PM-7AM, please contact night-coverage at [email or web address], password [facility] 05/29/2013, 9:37 AM  LOS: 2 days

## 2013-05-29 NOTE — Progress Notes (Signed)
Patient seen and examined. Agree with note as above.  Patient has been admitted with sepsis due to pyelonephritis.  She is on antibiotics and fever curve is improving.  She also has some shortness of breath which I feel is mostly atelectasis and poor inspiratory effort.  She is generally weak and will need SNF placement. Anticipate discharge in next 24-48 hours.  MEMON,JEHANZEB

## 2013-05-30 LAB — BASIC METABOLIC PANEL
CO2: 26 mEq/L (ref 19–32)
Calcium: 8.6 mg/dL (ref 8.4–10.5)
Chloride: 105 mEq/L (ref 96–112)
Creatinine, Ser: 0.7 mg/dL (ref 0.50–1.10)
GFR calc non Af Amer: 86 mL/min — ABNORMAL LOW (ref >90)
Sodium: 139 mEq/L (ref 135–145)

## 2013-05-30 LAB — CBC
HCT: 32.2 % — ABNORMAL LOW (ref 36.0–46.0)
MCV: 90.2 fL (ref 78.0–100.0)
Platelets: 200 10*3/uL (ref 150–400)
RBC: 3.57 MIL/uL — ABNORMAL LOW (ref 3.87–5.11)
RDW: 14.7 % (ref 11.5–15.5)
WBC: 7.6 10*3/uL (ref 4.0–10.5)

## 2013-05-30 MED ORDER — PREDNISONE 20 MG PO TABS
50.0000 mg | ORAL_TABLET | Freq: Every day | ORAL | Status: DC
  Administered 2013-05-30 – 2013-05-31 (×2): 50 mg via ORAL
  Filled 2013-05-30: qty 2
  Filled 2013-05-30: qty 1
  Filled 2013-05-30: qty 2
  Filled 2013-05-30: qty 1

## 2013-05-30 MED ORDER — AZITHROMYCIN 250 MG PO TABS
500.0000 mg | ORAL_TABLET | Freq: Every day | ORAL | Status: DC
  Administered 2013-05-30 – 2013-05-31 (×2): 500 mg via ORAL
  Filled 2013-05-30 (×2): qty 2

## 2013-05-30 NOTE — Progress Notes (Signed)
Patient seen, independently examined and chart reviewed. I agree with exam, assessment and plan discussed with Toya Smothers, NP.  67 year old woman present with generalized weakness and was admitted for UTI, pyelonephritis, sepsis. Pneumonia was also considered and the patient clinically improved on antibiotics with resolution of fever and leukocytosis.  She is afebrile with stable vital signs. She seems to feel better and exam is benign.  Basic metabolic panel and CBC unremarkable. Capillary blood sugars are well-controlled.  Discussed with daughter and grandson at bedside. We will continue treatment for sepsis/acute pyelonephritis. Change to Ceftin 11/14. Would anticipate discharge 11/14 skilled nursing facility.   Brendia Sacks, MD [facility] [phone]

## 2013-05-30 NOTE — Evaluation (Signed)
Physical Therapy Evaluation Patient Details Name: Amanda Armstrong MRN: [id number] DOB: [birth year] Today's Date: 05/30/2013 Time: 1030-1059 PT Time Calculation (min): 29 min  PT Assessment / Plan / Recommendation History of Present Illness  Pt is admitted with several week illness, found to be pyelonephritis.  She states that she is normally independent at home but recently has needed to use a walker for limited mobility.  She has bilateral TKR, the most recent of which was done last year.  Clinical Impression   Pt was seen for evaluation and found to be profoundly deconditioned from recent illness.  She states that she does not feel any better since admission to the hospital.  She is unable to ambulate functionally at this time.  She had a good outcome following rehab for TKR and she should do well working through this illness.  I would recommend SNF at d/c.    PT Assessment  Patient needs continued PT services    Follow Up Recommendations  SNF    Does the patient have the potential to tolerate intense rehabilitation      Barriers to Discharge        Equipment Recommendations  None recommended by PT    Recommendations for Other Services     Frequency Min 3X/week    Precautions / Restrictions Precautions Precautions: Fall Restrictions Weight Bearing Restrictions: No   Pertinent Vitals/Pain       Mobility  Bed Mobility Bed Mobility: Supine to Sit;Sitting - Scoot to Edge of Bed Supine to Sit: HOB elevated;4: Min guard Sitting - Scoot to [place] of Bed: 4: Min assist Transfers Transfers: Sit to Stand;Stand to Sit Sit to Stand: 4: Min guard;From bed;With upper extremity assist Stand to Sit: 4: Min guard;With upper extremity assist;To chair/3-in-1 Ambulation/Gait Ambulation/Gait Assistance: 4: Min assist Ambulation Distance (Feet): 2 Feet Assistive device: Rolling walker Gait Pattern: Trunk flexed;Shuffle Gait velocity: extremely slow and labored General Gait Details:  pt had difficulty standing erect with walker, but finally able to succeed...she did not have the strength/endurance to do anything more than transfer bed to chair with walker Stairs: No Wheelchair Mobility Wheelchair Mobility: No    Exercises     PT Diagnosis: Difficulty walking;Generalized weakness  PT Problem List: Decreased strength;Decreased activity tolerance;Decreased mobility;Pain;Cardiopulmonary status limiting activity PT Treatment Interventions: Gait training;Functional mobility training;Therapeutic exercise     PT Goals(Current goals can be found in the care plan section) Acute Rehab PT Goals Patient Stated Goal: wants to return home to independence PT Goal Formulation: With patient Time For Goal Achievement: 06/13/13 Potential to Achieve Goals: Good  Visit Information  Last PT Received On: 05/30/13 History of Present Illness: Pt is admitted with several week illness, found to be pyelonephritis.  She states that she is normally independent at home but recently has needed to use a walker for limited mobility.       Prior Functioning  Home Living Family/patient expects to be discharged to:: Skilled nursing facility Prior Function Level of Independence: Independent with assistive device(s) Communication Communication: No difficulties    Cognition  Cognition Arousal/Alertness: Awake/alert Behavior During Therapy: WFL for tasks assessed/performed Overall Cognitive Status: Within Functional Limits for tasks assessed    Extremity/Trunk Assessment Lower Extremity Assessment Lower Extremity Assessment: Generalized weakness   Balance Balance Balance Assessed: Yes Static Standing Balance Static Standing - Balance Support: Bilateral upper extremity supported Static Standing - Level of Assistance: 5: Stand by assistance  End of Session PT - End of Session Equipment Utilized During Treatment:  Gait belt Activity Tolerance: Patient limited by fatigue Patient left: in  bed;with call bell/phone within reach  GP     Myrlene Broker L 05/30/2013, 11:09 AM

## 2013-05-30 NOTE — Progress Notes (Signed)
PHARMACIST - PHYSICIAN COMMUNICATION DR:   Irene Limbo CONCERNING: Antibiotic IV to Oral Route Change Policy  RECOMMENDATION: This patient is receiving Zithromax by the intravenous route.  Based on criteria approved by the Pharmacy and Therapeutics Committee, the antibiotic(s) is/are being converted to the equivalent oral dose form(s).   DESCRIPTION: These criteria include:  Patient being treated for a respiratory tract infection, urinary tract infection, cellulitis or clostridium difficile associated diarrhea if on metronidazole  The patient is not neutropenic and does not exhibit a GI malabsorption state  The patient is eating (either orally or via tube) and/or has been taking other orally administered medications for a least 24 hours  The patient is improving clinically and has a Tmax < 100.5  If you have questions about this conversion, please contact the Pharmacy Department  [x]   [phone] )  [facility] []   [phone] )  [facility]  []   [phone] )  [facility] []   [phone] )  Ilene Qua   Junita Push, PharmD, BCPS

## 2013-05-30 NOTE — Clinical Social Work Placement (Signed)
Clinical Social Work Department CLINICAL SOCIAL WORK PLACEMENT NOTE 05/30/2013  Patient:  Amanda Armstrong, Amanda Armstrong  Account Number:  [id number] Admit date:  05/27/2013  Clinical Social Worker:  Cameron Ali, BSW Intern  Date/time:  05/30/2013 01:46 PM  Clinical Social Work is seeking post-discharge placement for this patient at the following level of care:   SKILLED NURSING   (*CSW will update this form in Epic as items are completed)   05/30/2013  Patient/family provided with [facility] Department of Clinical Social Work's list of facilities offering this level of care within the geographic area requested by the patient (or if unable, by the patient's family).  05/30/2013  Patient/family informed of their freedom to choose among providers that offer the needed level of care, that participate in Medicare, Medicaid or managed care program needed by the patient, have an available bed and are willing to accept the patient.  05/30/2013  Patient/family informed of [facility]' ownership interest in [facility], as well as of the fact that they are under no obligation to receive care at this facility.  PASARR submitted to EDS on  PASARR number received from EDS on   FL2 transmitted to all facilities in geographic area requested by pt/family on  05/30/2013 FL2 transmitted to all facilities within larger geographic area on 05/30/2013  Patient informed that his/her managed care company has contracts with or will negotiate with  certain facilities, including the following:     Patient/family informed of bed offers received:   Patient chooses bed at  Physician recommends and patient chooses bed at    Patient to be transferred to  on   Patient to be transferred to facility by   The following physician request were entered in Epic:   Additional Comments: Pt has existing PASARR.   Cameron Ali BSW Intern

## 2013-05-30 NOTE — Progress Notes (Signed)
[facility] PROGRESS NOTE  Amanda Armstrong ZOX:[id number] DOB: 1945-12-03 DOA: 05/27/2013 PCP: [name], MD  Assessment/Plan: Sepsis: Related to acute pyelonephritis. CT consistent with pyelonephritis on 05/28/13. Continues to improve slowly. Max temp 99.3 orally last 24 hours.  Leukocytosis resolved. BP remains stable. Tachycardia resolved. Normal respiratory effort. Rocephin day #4 and azithromycin day #3.Marland Kitchen Repeat blood cultures no growth to date. Previous cultures no growth to date.   Acute pyelonephritis. Urine cultures with E. Coli. Will continue antibiotics as above. Urine output 1.9L last 24 hours.   Tachycardia: Likely related to #1. Resolved.  CT chest negative for PE. No cardiac history noted in chart. EKG yields normal sinus rhythm. Patient denies any chest pain. Will monitor   Tachypnea with atelectasis vs. Infiltrate in RLL on chest xray. Repeat chest xray yields low lung volumes and mild interstitial thickening, likely due to chronic bronchitis. No overt congestive heart failure. Diminished bibasilar aeration due to developing atelectasis. Continue incentive spirometry. Antibiotic as above. BNP was elevated 11/11 and given 20mg  IV lasix with output 1.8L of urine. Continue neb treatments and mucinex. Mild wheezing this am. Will give prednisone.   Leukocytosis: Related to #1 and #2. Resolved   Hypokalemia: Related to lasix. Resolved.   Hyperglycemia: No history of diabetes. Likely related to #1. Hemoglobin A1c 5.6. CBG 91 this am.   Anxiety: More alert. Will continue home medication and monitor   Hypothyroidism: TSH 0.025. Continue home Synthroid. Repeat once acute illness has resolved.    Code Status: full Family Communication: none present Disposition Plan: home hopefully tomorrow   Consultants:  none  Procedures:  none  Antibiotics: Rocephin 05/27/13>>  Azithromycin 05/28/13>>>>   HPI/Subjective: Awake alert. Reports "feeling some better".    Objective: Filed Vitals:   05/30/13 0650  BP: 111/52  Pulse: 84  Temp: 99.3 F (37.4 C)  Resp: 20    Intake/Output Summary (Last 24 hours) at 05/30/13 0944 Last data filed at 05/30/13 0651  Gross per 24 hour  Intake      0 ml  Output   1575 ml  Net  -1575 ml   Filed Weights   05/27/13 0916 05/27/13 1352  Weight: 68.04 kg (150 lb) 68.947 kg (152 lb)    Exam:   General:  Alert calm comfortable  Cardiovascular: RRR No MGR trace LE edema PPP  Respiratory: normal effort. Faint expiratory wheeze. Decreased in bases. Pt need instruction during exam on how to take deep breath properly  Abdomen: obese soft +BS non-tender to palpation  Musculoskeletal: no clubbing no cyanosis   Data Reviewed: Basic Metabolic Panel:  Recent Labs Lab 05/27/13 0947 05/28/13 0545 05/29/13 0617 05/30/13 0554  NA 137 140 141 139  K 3.5 3.5 3.3* 3.8  CL 100 109 107 105  CO2 25 23 26 26   GLUCOSE 193* 100* 100* 91  BUN 16 14 11 14   CREATININE 0.82 0.76 0.72 0.70  CALCIUM 9.3 8.4 8.5 8.6   Liver Function Tests:  Recent Labs Lab 05/27/13 0947 05/29/13 0617  AST 17 30  ALT 26 36*  ALKPHOS 62 64  BILITOT 1.2 0.6  PROT 7.2 6.3  ALBUMIN 3.7 2.7*   No results found for this basename: LIPASE, AMYLASE,  in the last 168 hours No results found for this basename: AMMONIA,  in the last 168 hours CBC:  Recent Labs Lab 05/27/13 0947 05/28/13 0545 05/29/13 0617 05/30/13 0554  WBC 18.4* 13.7* 9.9 7.6  NEUTROABS 15.9*  --  7.2  --  HGB 12.5 10.9* 10.2* 10.4*  HCT 37.4 33.6* 32.1* 32.2*  MCV 88.8 89.8 88.9 90.2  PLT 211 193 193 200   Cardiac Enzymes: No results found for this basename: CKTOTAL, CKMB, CKMBINDEX, TROPONINI,  in the last 168 hours BNP (last 3 results)  Recent Labs  05/28/13 0545  PROBNP 1179.0*   CBG:  Recent Labs Lab 05/27/13 2353 05/28/13 0757 05/28/13 1637 05/28/13 2224 05/29/13 0734  GLUCAP 105* 86 126* 135* 101*    Recent Results (from the past  240 hour(s))  URINE CULTURE     Status: None   Collection Time    05/27/13  9:42 AM      Result Value Range Status   Specimen Description URINE, CATHETERIZED   Final   Special Requests NONE   Final   Culture  Setup Time     Final   Value: 05/27/2013 14:02     Performed at [facility]   Culture     Final   Value: >=100,000 COLONIES/mL ESCHERICHIA COLI     Performed at [facility]   Report Status 05/28/2013 FINAL   Final   Organism ID, Bacteria ESCHERICHIA COLI   Final  CULTURE, BLOOD (ROUTINE X 2)     Status: None   Collection Time    05/27/13 10:05 AM      Result Value Range Status   Specimen Description Blood RIGHT ARM   Final   Special Requests BOTTLES DRAWN AEROBIC AND ANAEROBIC 8 CC EACH   Final   Culture NO GROWTH 2 DAYS   Final   Report Status PENDING   Incomplete  CULTURE, BLOOD (ROUTINE X 2)     Status: None   Collection Time    05/27/13 10:12 AM      Result Value Range Status   Specimen Description Blood RIGHT HAND   Final   Special Requests BOTTLES DRAWN AEROBIC ONLY 6 CC   Final   Culture NO GROWTH 2 DAYS   Final   Report Status PENDING   Incomplete  CULTURE, BLOOD (ROUTINE X 2)     Status: None   Collection Time    05/28/13 11:17 AM      Result Value Range Status   Specimen Description Blood   Final   Special Requests NONE   Final   Culture NO GROWTH 1 DAY   Final   Report Status PENDING   Incomplete  CULTURE, BLOOD (ROUTINE X 2)     Status: None   Collection Time    05/28/13 11:17 AM      Result Value Range Status   Specimen Description Blood   Final   Special Requests NONE   Final   Culture NO GROWTH 1 DAY   Final   Report Status PENDING   Incomplete     Studies: Ct Angio Chest Pe W/cm &/or Wo Cm  05/29/2013   CLINICAL DATA:  Weakness sepsis, abdominal pain, tachycardia.  EXAM: CT ANGIOGRAPHY CHEST, ABDOMEN AND PELVIS  TECHNIQUE: Multidetector CT imaging through the chest, abdomen and pelvis was performed using the standard protocol  during bolus administration of intravenous contrast. Multiplanar reconstructed images including MIPs were obtained and reviewed to evaluate the vascular anatomy.  CONTRAST:  OMNIPAQUE IOHEXOL 350 MG/ML SOLN  FINDINGS: CTA CHEST FINDINGS  Satisfactory opacification of pulmonary arteries noted, and there is no evidence of pulmonary emboli. Patient breathing during the acquisition degrades some of the images. Adequate contrast opacification of the thoracic aorta with no  evidence of dissection, aneurysm, or stenosis. There is classic 3-vessel brachiocephalic arch anatomy without proximal stenosis. Nonocclusive plaque in the aortic arch and descending thoracic segment.  No pleural or pericardial effusion. Subcentimeter AP window, precarinal, and prevascular lymph nodes. No hilar adenopathy. Dependent consolidation/ atelectasis in the posterior and lateral basal segments right lower lobe and to a lesser degree in a in the posterior basal segment left lower lobe. Nonspecific noncalcified 5 mm nodule in the anterior right upper lobe adjacent to the minor fissure, image 36/5. Mild spondylitic change in the lower thoracic spine.  Review of the MIP images confirms the above findings.  CTA ABDOMEN AND PELVIS FINDINGS  Arterial findings:  Aorta: Mild plaque in the infrarenal segment. No aneurysm, dissection, or stenosis.  Celiac axis: Patent, unremarkable distal branching.  Superior mesenteric: Patent, classic distal branch anatomy.  Left renal: Single. There is a short segment stenosis approximately 1 cm beyond the ostium resulting in at least 75% diameter narrowing over short segment. Vessel widely patent distally.  Right renal: Single, patent.  Inferior mesenteric: Patent.  Left iliac: Eccentric nonocclusive plaque through the common iliac artery. Internal and external iliac arteries unremarkable.  Right iliac: Scattered nonocclusive plaque in the common iliac artery. Internal and external iliac arteries widely patent.   Venous findings: Dedicated venous phase imaging not obtained. Patent bilateral renal veins noted.  Review of the MIP images confirms the above findings.  Nonvascular findings: Diffuse fatty infiltration of the liver without focal lesion. Multiple subcentimeter partially calcified stones layer in the dependent aspect of the nondilated gallbladder. Unremarkable spleen 13 mm low-attenuation left adrenal nodule, stable. Unremarkable right adrenal gland, pancreas, right kidney. Poorly marginated areas of decreased enhancement in the interpolar region of the left kidney ; no focal abscess. Mild perinephric inflammatory/ edematous changes. Small 8 mm fact containing lesion in the upper pole left kidney, stable. No hydronephrosis. Stomach, small bowel, and colon are nondilated. Normal appendix. Scattered distal descending and sigmoid diverticula without adjacent inflammatory/ edematous change. 3.1 cm left adnexal mass with heavy coarse calcifications. Uterus and right adnexal region unremarkable. Urinary bladder physiologically distended. No ascites. No free air. No adenopathy. Degenerative disc disease L3-4 and L4-5.  IMPRESSION: CTA CHEST IMPRESSION  1. Negative for acute PE or thoracic aortic dissection. 2. Atelectasis/consolidation in the posterior and lateral basal segments right lower lobe.  CTA ABDOMEN AND PELVIS IMPRESSION  1. Proximal left renal artery stenosis of possible hemodynamic significance. 2. Abnormal parenchymal enhancement in the interpolar region of the left kidney suggesting pyelonephritis. Correlate with UA. 3. Negative for aortic aneurysm. 4. Cholelithiasis. 5. Fatty liver. 6. Descending and sigmoid diverticulosis.   Electronically Signed   By: Oley Balm M.D.   On: 05/29/2013 08:30   Dg Chest Port 1 View  05/29/2013   CLINICAL DATA:  Possible interstitial edema versus bronchitis  EXAM: PORTABLE CHEST - 1 VIEW  COMPARISON:  CT of 1 day prior.  Plain film of 2 days prior.  FINDINGS: Midline  trachea. Normal heart size. Diminished lung volumes. No definite pleural fluid. No pneumothorax. Apparent interstitial prominence, felt to be a combination of chronic bronchitis accentuated by low lung volumes. Patchy subsegmental atelectasis at the bases, greater right than left.  IMPRESSION: Low lung volumes and mild interstitial thickening, likely due to chronic bronchitis. No overt congestive heart failure.  Diminished bibasilar aeration with developing atelectasis.   Electronically Signed   By: Jeronimo Greaves M.D.   On: 05/29/2013 08:06   Ct Angio Abd/pel W/ And/or  W/o  05/28/2013   CLINICAL DATA:  Weakness sepsis, abdominal pain, tachycardia.  EXAM: CT ANGIOGRAPHY CHEST, ABDOMEN AND PELVIS  TECHNIQUE: Multidetector CT imaging through the chest, abdomen and pelvis was performed using the standard protocol during bolus administration of intravenous contrast. Multiplanar reconstructed images including MIPs were obtained and reviewed to evaluate the vascular anatomy.  CONTRAST:  OMNIPAQUE IOHEXOL 350 MG/ML SOLN  COMPARISON:  04/22/2009  FINDINGS: CTA CHEST FINDINGS  Satisfactory opacification of pulmonary arteries noted, and there is no evidence of pulmonary emboli. Patient breathing during the acquisition degrades some of the images. Adequate contrast opacification of the thoracic aorta with no evidence of dissection, aneurysm, or stenosis. There is classic 3-vessel brachiocephalic arch anatomy without proximal stenosis. Nonocclusive plaque in the aortic arch and descending thoracic segment.  No pleural or pericardial effusion. Subcentimeter AP window, precarinal, and prevascular lymph nodes. No hilar adenopathy. Dependent consolidation/ atelectasis in the posterior and lateral basal segments right lower lobe and to a lesser degree in a in the posterior basal segment left lower lobe. Nonspecific noncalcified 5 mm nodule in the anterior right upper lobe adjacent to the minor fissure, image 36/5. Mild  spondylitic change in the lower thoracic spine.  Review of the MIP images confirms the above findings.  CTA ABDOMEN AND PELVIS FINDINGS  Arterial findings:  Aorta: Mild plaque in the infrarenal segment. No aneurysm, dissection, or stenosis.  Celiac axis:         Patent, unremarkable distal branching.  Superior mesenteric: Patent, classic distal branch anatomy.  Left renal: Single. There is a short segment stenosis approximately 1 cm beyond the ostium resulting in at least 75% diameter narrowing over short segment. Vessel widely patent distally.  Right renal:         Single, patent.  Inferior mesenteric: Patent.  Left iliac: Eccentric nonocclusive plaque through the common iliac artery. Internal and external iliac arteries unremarkable.  Right iliac: Scattered nonocclusive plaque in the common iliac artery. Internal and external iliac arteries widely patent.  Venous findings: Dedicated venous phase imaging not obtained. Patent bilateral renal veins noted.  Review of the MIP images confirms the above findings.  Nonvascular findings: Diffuse fatty infiltration of the liver without focal lesion. Multiple subcentimeter partially calcified stones layer in the dependent aspect of the nondilated gallbladder. Unremarkable spleen 13 mm low-attenuation left adrenal nodule, stable. Unremarkable right adrenal gland, pancreas, right kidney. Poorly marginated areas of decreased enhancement in the interpolar region of the left kidney ; no focal abscess. Mild perinephric inflammatory/ edematous changes. Small 8 mm fact containing lesion in the upper pole left kidney, stable. No hydronephrosis. Stomach, small bowel, and colon are nondilated. Normal appendix. Scattered distal descending and sigmoid diverticula without adjacent inflammatory/ edematous change. 3.1 cm left adnexal mass with heavy coarse calcifications. Uterus and right adnexal region unremarkable. Urinary bladder physiologically distended. No ascites. No free air. No  adenopathy. Degenerative disc disease L3-4 and L4-5.  : IMPRESSION:  CTA CHEST IMPRESSION  1. Negative for acute PE or thoracic aortic dissection. 2. Atelectasis/consolidation in the posterior and lateral basal segments right lower lobe.  CTA ABDOMEN AND PELVIS IMPRESSION  1. Proximal left renal artery stenosis of possible hemodynamic significance. 2. Abnormal parenchymal enhancement in the interpolar region of the left kidney suggesting pyelonephritis. Correlate with UA. 3. Negative for aortic aneurysm. 4. Cholelithiasis. 5. Fatty liver. 6. Descending and sigmoid diverticulosis.   Electronically Signed   By: Oley Balm M.D.   On: 05/28/2013 12:24    Scheduled  Meds: . ALPRAZolam  1 mg Oral QHS  . atorvastatin  10 mg Oral q1800  . azithromycin  500 mg Intravenous Q24H  . cefTRIAXone (ROCEPHIN)  IV  1 g Intravenous Q24H  . dextromethorphan-guaiFENesin  1 tablet Oral BID  . enoxaparin (LOVENOX) injection  40 mg Subcutaneous Q24H  . levalbuterol  0.63 mg Nebulization Q6H  . levothyroxine  100 mcg Oral QAC breakfast  . loratadine  10 mg Oral Daily   Continuous Infusions: . sodium chloride 50 mL/hr at 05/30/13 0930    Principal Problem:   Sepsis Active Problems:   Anxiety   Hypothyroidism   Tachycardia   UTI (lower urinary tract infection)   Tachypnea   Leukocytosis   Hyperglycemia   Acute pyelonephritis    Time spent: 35 minutes    [facility] M  [facility] Pager [phone]. If 7PM-7AM, please contact night-coverage at [email or web address], password [facility] 05/30/2013, 9:44 AM  LOS: 3 days

## 2013-05-30 NOTE — Clinical Social Work Psychosocial (Addendum)
Clinical Social Work Department BRIEF PSYCHOSOCIAL ASSESSMENT 05/30/2013  Patient:  Amanda Armstrong, Amanda Armstrong     Account Number:  [id number]     Admit date:  05/27/2013  Clinical Social Worker:  Ambrose Pancoast Intern Date/Time:  05/30/2013 01:08 PM  Referred by:  CSW  Date Referred:  05/30/2013 Referred for  SNF Placement   Other Referral:   Interview type:  Patient Other interview type:    PSYCHOSOCIAL DATA Living Status:  HUSBAND Admitted from facility:   Level of care:   Primary support name:  Fayrene Fearing Primary support relationship to patient:  SPOUSE Degree of support available:   Very supportive    CURRENT CONCERNS Current Concerns  Post-Acute Placement   Other Concerns:    SOCIAL WORK ASSESSMENT / PLAN Spoke with pt, pt's husband Fayrene Fearing, and daughter [name] in room. Pt alreat and oriented. Pt states she was doing very well at home with her husband before she came to hospital. She explained she wasn't able to walk at all one day, which is what brought her here. Pt states her husband is her best support system. Pt and husband live in [place], and daughter lives in [place]. Discussed SNF placement and insurance authorization with pt who understands this process. Pt and family are requesting [facility]. Per husband, pt had been to [facility] in the past for rehab. Pt requesting bed offers in [facility]. Husband stated that he does not drive and [facility] would be their first choice due tothis. Left SNF list in room. CSW will initiate bed search and continue to follow.   Assessment/plan status:  Psychosocial Support/Ongoing Assessment of Needs Other assessment/ plan:   Information/referral to community resources:   SNF list.    PATIENT'S/FAMILY'S RESPONSE TO PLAN OF CARE: Pt and family agreeable to initiate bed offers for [facility]. Pt understands insurance authorization. CSW will continue to follow and will initiate insurance auth.   Cameron Ali BSW Intern     [place], [place] [phone]

## 2013-05-31 LAB — GLUCOSE, CAPILLARY: Glucose-Capillary: 104 mg/dL — ABNORMAL HIGH (ref 70–99)

## 2013-05-31 MED ORDER — HYDROCODONE-ACETAMINOPHEN 5-325 MG PO TABS: 1.0000 | ORAL_TABLET | ORAL | Status: DC | PRN

## 2013-05-31 MED ORDER — CEFUROXIME AXETIL 500 MG PO TABS: 500.0000 mg | ORAL_TABLET | Freq: Two times a day (BID) | ORAL | Status: DC

## 2013-05-31 MED ORDER — AZITHROMYCIN 250 MG PO TABS: ORAL_TABLET | ORAL | Status: DC

## 2013-05-31 MED ORDER — PREDNISONE 10 MG PO TABS: ORAL_TABLET | ORAL | Status: DC

## 2013-05-31 MED ORDER — FEXOFENADINE HCL 180 MG PO TABS: 180.0000 mg | ORAL_TABLET | ORAL | Status: DC

## 2013-05-31 NOTE — Clinical Social Work Placement (Signed)
    Clinical Social Work Department CLINICAL SOCIAL WORK PLACEMENT NOTE 05/31/2013  Patient:  Amanda Armstrong, Amanda Armstrong  Account Number:  [id number] Admit date:  05/27/2013  Clinical Social Worker:  EMILY SZPYRKA, CLINICAL SOCIAL WORKER  Date/time:  05/30/2013 01:46 PM  Clinical Social Work is seeking post-discharge placement for this patient at the following level of care:   SKILLED NURSING   (*CSW will update this form in Epic as items are completed)   05/30/2013  Patient/family provided with [facility] Department of Clinical Social Work's list of facilities offering this level of care within the geographic area requested by the patient (or if unable, by the patient's family).  05/30/2013  Patient/family informed of their freedom to choose among providers that offer the needed level of care, that participate in Medicare, Medicaid or managed care program needed by the patient, have an available bed and are willing to accept the patient.  05/30/2013  Patient/family informed of [facility]' ownership interest in [facility], as well as of the fact that they are under no obligation to receive care at this facility.  PASARR submitted to EDS on 05/30/2013 PASARR number received from EDS on 05/30/2013  FL2 transmitted to all facilities in geographic area requested by pt/family on  05/30/2013 FL2 transmitted to all facilities within larger geographic area on 05/30/2013  Patient informed that his/her managed care company has contracts with or will negotiate with  certain facilities, including the following:     Patient/family informed of bed offers received:  05/31/2013 Patient chooses bed at [facility] SNF Physician recommends and patient chooses bed at    Patient to be transferred to [facility] SNF on  05/31/2013 Patient to be transferred to facility by Family car  The following physician request were entered in Epic:   Additional Comments: Pt has existing  PASARR.  Santa Genera, LCSW Clinical Social Worker [phone])

## 2013-05-31 NOTE — Clinical Social Work Note (Signed)
CSW gave bed offers to patient and husband, explained difference between in and out of network benefits.  Family chose [facility], facility notified.  CSW left message for B [facility] Lope [facility] rep, to inform of choice of facility and to request SNF auth.  CSW Reubin Milan had provided clinicals for review 11/13.  Santa Genera, LCSW Clinical Social Worker [phone])

## 2013-05-31 NOTE — Discharge Summary (Signed)
Physician Discharge Summary  Amanda Armstrong ZOX:[id number] DOB: 06-07-1946 DOA: 05/27/2013  PCP: [name], MD  Admit date: 05/27/2013 Discharge date: 05/31/2013  Time spent: 40 minutes  Recommendations for Outpatient Follow-up:  Follow up with PCP 1 week for evaluation of symptoms. Repeat TSH. Discharge Diagnoses:  Principal Problem:   Sepsis Active Problems:   Anxiety   Hypothyroidism   Tachycardia   UTI (lower urinary tract infection)   Tachypnea   Leukocytosis   Hyperglycemia   Acute pyelonephritis   Discharge Condition: stable  Diet recommendation: regular  Filed Weights   05/27/13 0916 05/27/13 1352  Weight: 68.04 kg (150 lb) 68.947 kg (152 lb)    History of present illness:  Amanda Armstrong is a 67 y.o. female with a past medical history that includes recent UTI, hypothyroidism, anxiety, vertigo presented to the emergency department on 05/27/13 with chief complaint of generalized weakness over the last several days.  3 weeks prior patient was seen for similar symptoms. An MRI of the brain was completed during that ED visit but was motion degraded. She was diagnosed at that time with urinary tract infection and was started on Keflex. Her symptoms improved. She was seen by her PCP since that time and she also has an appointment with a neurologist for followup. Husband reported that the day before  patient's appetite was decreased. There was no complaint of abdominal pain nausea or vomiting. On day of admission she became so weak she couldn't stand up straight with her walker. She reported that she did complete the Keflex and that she got better for a few days. She stated that she feels so weak and she is afraid of falling from losing her balance. She admits to chronic low back pain as well as bilateral knee pain. She denied any dysuria hematuria frequency or urgency. She denied any fever chills. She reportd she had a normal bowel movement prior to presentation. She  denied diarrhea melena or bright red blood per rectum. In the emergency room she was found to be slightly febrile with a rectal temp of 100.5, tachycardic with tachypnea and stable blood pressure. Lab work significant for white count of 18.4, glucose of 193 and a lactic acid of 2.8. Chest x-ray yielded hypoinflation with possible atelectasis especially in the right lung base. No overt evidence of CHF and no focal pneumonia. Urine significant for many bacteria too numerous to count WBCs large leukocytes positive nitrites. In the emergency department she was given 1 L of normal saline intravenously one dose of Rocephin and Tylenol.    Hospital Course:  Sepsis: Related to acute pyelonephritis. Admitted to telemetry.  CT consistent with pyelonephritis on 05/28/13. Received rocephin for 5 days and azithromycin for 4 days. Leukocytosis resolved. BP remained stable.Tachycardia resolved. Blood cultures no growth to date x2.   Acute pyelonephritis. Urine cultures with E. Coli. Antibiotics as above. Will be discharged with one more day of azithromycin for total of 5 days. Will be discharged with ceftin for 4 more days to total 8 days. Follow up with PCP 1 week for evaluation of symptoms  Tachycardia: Likely related to #1. Resolved. CT chest negative for PE. No cardiac history noted in chart. EKG yields normal sinus rhythm. Patient denies any chest pain.   Tachypnea with atelectasis vs. Infiltrate in RLL on chest xray. Repeat chest xray yields low lung volumes and mild interstitial thickening, likely due to chronic bronchitis. No overt congestive heart failure. Diminished bibasilar aeration due to developing atelectasis. Provided  with incentive spirometry. Antibiotic as above. BNP was elevated 11/11 and given 20mg  IV lasix with output 1.8L of urine. Neb treatments and mucinex and prednisone provided for mild wheeze 05/30/13. At discharge BS only slightly diminished. No wheeze. Will discharge with quick prednisone  taper.    Leukocytosis: Related to #1 and #2. Resolved   Hypokalemia: Related to lasix. Resolved.   Hyperglycemia: No history of diabetes. Likely related to #1. Hemoglobin A1c 5.6.   Anxiety: Stable during this hospitalization.    Hypothyroidism: TSH 0.025. Continue home Synthroid. Repeat once acute illness has resolved.      Procedures:    Consultations:  none  Discharge Exam: Filed Vitals:   05/31/13 0500  BP: 122/64  Pulse:   Temp: 98.4 F (36.9 C)  Resp: 18    General: obese NAD Cardiovascular: RRR No MGR trace LE edema Respiratory: normal effort BS clear bilaterally slightly diminished in bases.   Discharge Instructions     Medication List    STOP taking these medications       ALEVE 220 MG tablet  Generic drug:  naproxen sodium      TAKE these medications       alendronate 70 MG tablet  Commonly known as:  FOSAMAX  Take 70 mg by mouth every 7 (seven) days.     ALPRAZolam 1 MG tablet  Commonly known as:  XANAX  Take 1 mg by mouth at bedtime. Prescribed one tablet three times daily as needed For anxiety     aspirin EC 81 MG tablet  Take 81 mg by mouth daily.     azithromycin 250 MG tablet  Commonly known as:  ZITHROMAX  Take 2 tabs daily.     cefUROXime 500 MG tablet  Commonly known as:  CEFTIN  Take 1 tablet (500 mg total) by mouth 2 (two) times daily with a meal.     fexofenadine 180 MG tablet  Commonly known as:  ALLEGRA  Take 1 tablet (180 mg total) by mouth once a week.     HYDROcodone-acetaminophen 5-325 MG per tablet  Commonly known as:  NORCO/VICODIN  Take 1-2 tablets by mouth every 4 (four) hours as needed for moderate pain.     levothyroxine 100 MCG tablet  Commonly known as:  SYNTHROID, LEVOTHROID  Take 100 mcg by mouth every morning.     predniSONE 10 MG tablet  Commonly known as:  DELTASONE  Take 4 tabs 11/15, take 3 tabs 11/16, take 2 tabs 11/17 and take 1 tab 11/18 then stop.       Allergies  Allergen  Reactions  . Oxycodone Other (See Comments)    "CX HER TO FELL LIKE SHE IS OUT OF HER BODY"   Follow-up Information   Follow up with [name], MD. Schedule an appointment as soon as possible for a visit in 1 week. (evaluation of symptoms)    Specialty:  Family Medicine   Contact information:   [address] [place] [address] [phone]        The results of significant diagnostics from this hospitalization (including imaging, microbiology, ancillary and laboratory) are listed below for reference.    Significant Diagnostic Studies: Dg Chest 2 View  05/27/2013   CLINICAL DATA:  Tachypnea and dizziness  EXAM: CHEST  2 VIEW  COMPARISON:  Chest x-ray of November 01, 2011.  FINDINGS: The lungs are borderline hypoinflated. The interstitial markings are slightly increased. The cardiac silhouette is normal in size  and contour. There is mild tortuosity of the descending thoracic aorta. The perihilar lung markings are mildly prominent but are not entirely new. The gas pattern in the upper abdomen is nonspecific. The bony structures exhibit no acute abnormalities. There is degenerative disk space narrowing in the mid thoracic spine.  IMPRESSION: 1. Hypo inflation limits the study. I cannot exclude subsegmental atelectasis especially in the right lung base. 2. There is no overt evidence of CHF and there is no focal pneumonia demonstrated. .   Electronically Signed   By: David  [place]   On: 05/27/2013 11:29   Ct Angio Chest Pe W/cm &/or Wo Cm  05/29/2013   CLINICAL DATA:  Weakness sepsis, abdominal pain, tachycardia.  EXAM: CT ANGIOGRAPHY CHEST, ABDOMEN AND PELVIS  TECHNIQUE: Multidetector CT imaging through the chest, abdomen and pelvis was performed using the standard protocol during bolus administration of intravenous contrast. Multiplanar reconstructed images including MIPs were obtained and reviewed to evaluate the vascular anatomy.  CONTRAST:  OMNIPAQUE  IOHEXOL 350 MG/ML SOLN  FINDINGS: CTA CHEST FINDINGS  Satisfactory opacification of pulmonary arteries noted, and there is no evidence of pulmonary emboli. Patient breathing during the acquisition degrades some of the images. Adequate contrast opacification of the thoracic aorta with no evidence of dissection, aneurysm, or stenosis. There is classic 3-vessel brachiocephalic arch anatomy without proximal stenosis. Nonocclusive plaque in the aortic arch and descending thoracic segment.  No pleural or pericardial effusion. Subcentimeter AP window, precarinal, and prevascular lymph nodes. No hilar adenopathy. Dependent consolidation/ atelectasis in the posterior and lateral basal segments right lower lobe and to a lesser degree in a in the posterior basal segment left lower lobe. Nonspecific noncalcified 5 mm nodule in the anterior right upper lobe adjacent to the minor fissure, image 36/5. Mild spondylitic change in the lower thoracic spine.  Review of the MIP images confirms the above findings.  CTA ABDOMEN AND PELVIS FINDINGS  Arterial findings:  Aorta: Mild plaque in the infrarenal segment. No aneurysm, dissection, or stenosis.  Celiac axis: Patent, unremarkable distal branching.  Superior mesenteric: Patent, classic distal branch anatomy.  Left renal: Single. There is a short segment stenosis approximately 1 cm beyond the ostium resulting in at least 75% diameter narrowing over short segment. Vessel widely patent distally.  Right renal: Single, patent.  Inferior mesenteric: Patent.  Left iliac: Eccentric nonocclusive plaque through the common iliac artery. Internal and external iliac arteries unremarkable.  Right iliac: Scattered nonocclusive plaque in the common iliac artery. Internal and external iliac arteries widely patent.  Venous findings: Dedicated venous phase imaging not obtained. Patent bilateral renal veins noted.  Review of the MIP images confirms the above findings.  Nonvascular findings: Diffuse fatty  infiltration of the liver without focal lesion. Multiple subcentimeter partially calcified stones layer in the dependent aspect of the nondilated gallbladder. Unremarkable spleen 13 mm low-attenuation left adrenal nodule, stable. Unremarkable right adrenal gland, pancreas, right kidney. Poorly marginated areas of decreased enhancement in the interpolar region of the left kidney ; no focal abscess. Mild perinephric inflammatory/ edematous changes. Small 8 mm fact containing lesion in the upper pole left kidney, stable. No hydronephrosis. Stomach, small bowel, and colon are nondilated. Normal appendix. Scattered distal descending and sigmoid diverticula without adjacent inflammatory/ edematous change. 3.1 cm left adnexal mass with heavy coarse calcifications. Uterus and right adnexal region unremarkable. Urinary bladder physiologically distended. No ascites. No free air. No adenopathy. Degenerative disc disease L3-4 and L4-5.  IMPRESSION: CTA CHEST IMPRESSION  1. Negative for  acute PE or thoracic aortic dissection. 2. Atelectasis/consolidation in the posterior and lateral basal segments right lower lobe.  CTA ABDOMEN AND PELVIS IMPRESSION  1. Proximal left renal artery stenosis of possible hemodynamic significance. 2. Abnormal parenchymal enhancement in the interpolar region of the left kidney suggesting pyelonephritis. Correlate with UA. 3. Negative for aortic aneurysm. 4. Cholelithiasis. 5. Fatty liver. 6. Descending and sigmoid diverticulosis.   Electronically Signed   By: Oley Balm M.D.   On: 05/29/2013 08:30   Dg Chest Port 1 View  05/29/2013   CLINICAL DATA:  Possible interstitial edema versus bronchitis  EXAM: PORTABLE CHEST - 1 VIEW  COMPARISON:  CT of 1 day prior.  Plain film of 2 days prior.  FINDINGS: Midline trachea. Normal heart size. Diminished lung volumes. No definite pleural fluid. No pneumothorax. Apparent interstitial prominence, felt to be a combination of chronic bronchitis accentuated  by low lung volumes. Patchy subsegmental atelectasis at the bases, greater right than left.  IMPRESSION: Low lung volumes and mild interstitial thickening, likely due to chronic bronchitis. No overt congestive heart failure.  Diminished bibasilar aeration with developing atelectasis.   Electronically Signed   By: Jeronimo Greaves M.D.   On: 05/29/2013 08:06   Dg Chest Port 1 View  05/27/2013   CLINICAL DATA:  Increase wheezing tonight  EXAM: PORTABLE CHEST - 1 VIEW  COMPARISON:  Chest radiograph 05/27/2013.  FINDINGS: Normal cardiac silhouette. There is a mild interstitial pattern which is slightly increased compared prior. No focal consolidation. No pneumothorax.  IMPRESSION: Fine interstitial pattern could represent bronchiolitis or interstitial edema.   Electronically Signed   By: Genevive Bi M.D.   On: 05/27/2013 19:58   Ct Angio Abd/pel W/ And/or W/o  05/28/2013   CLINICAL DATA:  Weakness sepsis, abdominal pain, tachycardia.  EXAM: CT ANGIOGRAPHY CHEST, ABDOMEN AND PELVIS  TECHNIQUE: Multidetector CT imaging through the chest, abdomen and pelvis was performed using the standard protocol during bolus administration of intravenous contrast. Multiplanar reconstructed images including MIPs were obtained and reviewed to evaluate the vascular anatomy.  CONTRAST:  OMNIPAQUE IOHEXOL 350 MG/ML SOLN  COMPARISON:  04/22/2009  FINDINGS: CTA CHEST FINDINGS  Satisfactory opacification of pulmonary arteries noted, and there is no evidence of pulmonary emboli. Patient breathing during the acquisition degrades some of the images. Adequate contrast opacification of the thoracic aorta with no evidence of dissection, aneurysm, or stenosis. There is classic 3-vessel brachiocephalic arch anatomy without proximal stenosis. Nonocclusive plaque in the aortic arch and descending thoracic segment.  No pleural or pericardial effusion. Subcentimeter AP window, precarinal, and prevascular lymph nodes. No hilar adenopathy.  Dependent consolidation/ atelectasis in the posterior and lateral basal segments right lower lobe and to a lesser degree in a in the posterior basal segment left lower lobe. Nonspecific noncalcified 5 mm nodule in the anterior right upper lobe adjacent to the minor fissure, image 36/5. Mild spondylitic change in the lower thoracic spine.  Review of the MIP images confirms the above findings.  CTA ABDOMEN AND PELVIS FINDINGS  Arterial findings:  Aorta: Mild plaque in the infrarenal segment. No aneurysm, dissection, or stenosis.  Celiac axis:         Patent, unremarkable distal branching.  Superior mesenteric: Patent, classic distal branch anatomy.  Left renal: Single. There is a short segment stenosis approximately 1 cm beyond the ostium resulting in at least 75% diameter narrowing over short segment. Vessel widely patent distally.  Right renal:         Single,  patent.  Inferior mesenteric: Patent.  Left iliac: Eccentric nonocclusive plaque through the common iliac artery. Internal and external iliac arteries unremarkable.  Right iliac: Scattered nonocclusive plaque in the common iliac artery. Internal and external iliac arteries widely patent.  Venous findings: Dedicated venous phase imaging not obtained. Patent bilateral renal veins noted.  Review of the MIP images confirms the above findings.  Nonvascular findings: Diffuse fatty infiltration of the liver without focal lesion. Multiple subcentimeter partially calcified stones layer in the dependent aspect of the nondilated gallbladder. Unremarkable spleen 13 mm low-attenuation left adrenal nodule, stable. Unremarkable right adrenal gland, pancreas, right kidney. Poorly marginated areas of decreased enhancement in the interpolar region of the left kidney ; no focal abscess. Mild perinephric inflammatory/ edematous changes. Small 8 mm fact containing lesion in the upper pole left kidney, stable. No hydronephrosis. Stomach, small bowel, and colon are nondilated.  Normal appendix. Scattered distal descending and sigmoid diverticula without adjacent inflammatory/ edematous change. 3.1 cm left adnexal mass with heavy coarse calcifications. Uterus and right adnexal region unremarkable. Urinary bladder physiologically distended. No ascites. No free air. No adenopathy. Degenerative disc disease L3-4 and L4-5.  : IMPRESSION:  CTA CHEST IMPRESSION  1. Negative for acute PE or thoracic aortic dissection. 2. Atelectasis/consolidation in the posterior and lateral basal segments right lower lobe.  CTA ABDOMEN AND PELVIS IMPRESSION  1. Proximal left renal artery stenosis of possible hemodynamic significance. 2. Abnormal parenchymal enhancement in the interpolar region of the left kidney suggesting pyelonephritis. Correlate with UA. 3. Negative for aortic aneurysm. 4. Cholelithiasis. 5. Fatty liver. 6. Descending and sigmoid diverticulosis.   Electronically Signed   By: Oley Balm M.D.   On: 05/28/2013 12:24    Microbiology: Recent Results (from the past 240 hour(s))  URINE CULTURE     Status: None   Collection Time    05/27/13  9:42 AM      Result Value Range Status   Specimen Description URINE, CATHETERIZED   Final   Special Requests NONE   Final   Culture  Setup Time     Final   Value: 05/27/2013 14:02     Performed at [facility]   Culture     Final   Value: >=100,000 COLONIES/mL ESCHERICHIA COLI     Performed at [facility]   Report Status 05/28/2013 FINAL   Final   Organism ID, Bacteria ESCHERICHIA COLI   Final  CULTURE, BLOOD (ROUTINE X 2)     Status: None   Collection Time    05/27/13 10:05 AM      Result Value Range Status   Specimen Description BLOOD RIGHT ARM   Final   Special Requests BOTTLES DRAWN AEROBIC AND ANAEROBIC 8CC   Final   Culture NO GROWTH 4 DAYS   Final   Report Status PENDING   Incomplete  CULTURE, BLOOD (ROUTINE X 2)     Status: None   Collection Time    05/27/13 10:12 AM      Result Value Range Status    Specimen Description BLOOD RIGHT HAND   Final   Special Requests BOTTLES DRAWN AEROBIC ONLY 6CC   Final   Culture NO GROWTH 4 DAYS   Final   Report Status PENDING   Incomplete  CULTURE, BLOOD (ROUTINE X 2)     Status: None   Collection Time    05/28/13 11:17 AM      Result Value Range Status   Specimen Description BLOOD RIGHT ARM  Final   Special Requests BOTTLES DRAWN AEROBIC AND ANAEROBIC 8CC   Final   Culture NO GROWTH 3 DAYS   Final   Report Status PENDING   Incomplete  CULTURE, BLOOD (ROUTINE X 2)     Status: None   Collection Time    05/28/13 11:17 AM      Result Value Range Status   Specimen Description BLOOD RIGHT HAND   Final   Special Requests BOTTLES DRAWN AEROBIC AND ANAEROBIC 8CC   Final   Culture NO GROWTH 3 DAYS   Final   Report Status PENDING   Incomplete     Labs: Basic Metabolic Panel:  Recent Labs Lab 05/27/13 0947 05/28/13 0545 05/29/13 0617 05/30/13 0554  NA 137 140 141 139  K 3.5 3.5 3.3* 3.8  CL 100 109 107 105  CO2 25 23 26 26   GLUCOSE 193* 100* 100* 91  BUN 16 14 11 14   CREATININE 0.82 0.76 0.72 0.70  CALCIUM 9.3 8.4 8.5 8.6   Liver Function Tests:  Recent Labs Lab 05/27/13 0947 05/29/13 0617  AST 17 30  ALT 26 36*  ALKPHOS 62 64  BILITOT 1.2 0.6  PROT 7.2 6.3  ALBUMIN 3.7 2.7*   No results found for this basename: LIPASE, AMYLASE,  in the last 168 hours No results found for this basename: AMMONIA,  in the last 168 hours CBC:  Recent Labs Lab 05/27/13 0947 05/28/13 0545 05/29/13 0617 05/30/13 0554  WBC 18.4* 13.7* 9.9 7.6  NEUTROABS 15.9*  --  7.2  --   HGB 12.5 10.9* 10.2* 10.4*  HCT 37.4 33.6* 32.1* 32.2*  MCV 88.8 89.8 88.9 90.2  PLT 211 193 193 200   Cardiac Enzymes: No results found for this basename: CKTOTAL, CKMB, CKMBINDEX, TROPONINI,  in the last 168 hours BNP: BNP (last 3 results)  Recent Labs  05/28/13 0545  PROBNP 1179.0*   CBG:  Recent Labs Lab 05/28/13 0757 05/28/13 1637 05/28/13 2224  05/29/13 0734 05/31/13 0756  GLUCAP 86 126* 135* 101* 104*       Signed:  BLACK,KAREN M  [facility] 05/31/2013, 12:27 PM

## 2013-05-31 NOTE — Progress Notes (Addendum)
Pt is to be discharged to [facility] today. Report had been called. Pt is in NAD, IV is out, all paperwork has been reviewed/discussed with patient, and there are no questions/concerns at this time. Assessment is unchanged from this morning. Pt is to be accompanied downstairs by staff and family via wheelchair.

## 2013-05-31 NOTE — Clinical Social Work Note (Signed)
[facility] has received insurance authorization for patient to admit to SNF, facility has been faxed the discharge summary via [facility]. Husband informed by phone.  RN calling granddaughter to transport.  Santa Genera, LCSW Clinical Social Worker [phone])

## 2013-05-31 NOTE — Discharge Summary (Signed)
Patient seen, independently examined and chart reviewed. I agree with exam, assessment and plan discussed with Toya Smothers, NP.  She feels better today. No new complaints. She remains afebrile and vital signs are stable. Exam is benign.  She is stable for discharge. She will complete oral antibiotics for full treatment of pyelonephritis.  Brendia Sacks, MD [facility] [phone]

## 2013-05-31 NOTE — Clinical Social Work Note (Signed)
Patient ready for discharge today, will admit to [facility].  Granddaughter will transport in private car.  Husband and patient aware and agreeable to transport.  FL2 reviewed w RN and updated as needed.  Discharge summary faxed to facility via [facility].  Elease Hashimoto from [employer] has authorized 7 day stay at [name].  Discharge packet prepared and placed w shadow chart for transport.  CSW signing off as no further SW needs identified.  Santa Genera, LCSW Clinical Social Worker [phone])

## 2013-06-01 LAB — CULTURE, BLOOD (ROUTINE X 2)
Culture: NO GROWTH
Culture: NO GROWTH

## 2013-06-02 LAB — CULTURE, BLOOD (ROUTINE X 2)
Culture: NO GROWTH
Culture: NO GROWTH

## 2013-07-09 ENCOUNTER — Ambulatory Visit (INDEPENDENT_AMBULATORY_CARE_PROVIDER_SITE_OTHER): Payer: Medicare Other | Admitting: Neurology

## 2013-07-09 ENCOUNTER — Encounter: Payer: Self-pay | Admitting: Neurology

## 2013-07-09 ENCOUNTER — Encounter (INDEPENDENT_AMBULATORY_CARE_PROVIDER_SITE_OTHER): Payer: Self-pay

## 2013-07-09 VITALS — BP 135/72 | HR 124 | Temp 99.0°F | Ht 59.0 in | Wt 150.0 lb

## 2013-07-09 DIAGNOSIS — A419 Sepsis, unspecified organism: Secondary | ICD-10-CM

## 2013-07-09 DIAGNOSIS — R269 Unspecified abnormalities of gait and mobility: Secondary | ICD-10-CM

## 2013-07-09 DIAGNOSIS — R2689 Other abnormalities of gait and mobility: Secondary | ICD-10-CM

## 2013-07-09 DIAGNOSIS — N390 Urinary tract infection, site not specified: Secondary | ICD-10-CM

## 2013-07-09 DIAGNOSIS — R4182 Altered mental status, unspecified: Secondary | ICD-10-CM

## 2013-07-09 NOTE — Patient Instructions (Signed)
Continue outpatient rehab and your medications as directed, but I think you should just take the Plavix, not the aspirin. As discussed, secondary prevention is key after a stroke. This means: taking care of blood sugar values or diabetes management, good blood pressure (hypertension) control and optimizing cholesterol management, exercising daily or regularly within your own mobility limitations of course and overall cardiovascular risk factor reduction, which includes screening for and treatment of obstructive sleep apnea (OSA) and weight management.

## 2013-07-09 NOTE — Progress Notes (Signed)
Subjective:    Patient ID: Amanda Armstrong is a 67 y.o. female.  HPI    Amanda Foley, MD, PhD [facility] [address] [address] [place], [place] [address]  Dear Dr. Sherwood Gambler,   I saw your patient, Amanda Armstrong, upon your kind request in my neurologic clinic today for initial consultation of her gait disorder, concern for stroke. The patient is accompanied by her husband today. As you know, Amanda Armstrong is a 67 year old right-handed woman with an underlying medical history of hypothyroidism, anxiety, UTI, tachycardia and arthritis, who was admitted to [facility] in November of this year with generalized weakness lasting several days. She had been seen previously for similar symptoms a few weeks prior. She had a brain MRI on 04/26/2013: Exam is motion degraded. No acute infarct. No intracranial hemorrhage. Mild to moderate small vessel disease type changes. Major intracranial vascular structures are patent. Global atrophy. Ventricular prominence probably related to atrophy although difficult to completely exclude a mild component of hydrocephalus. No intracranial mass lesion noted on this unenhanced exam. Cervical spondylotic changes C3-4 level cord flattening. Congenital fusion C2 and C3. Mild exophthalmos. I personally reviewed those films through the PACS system.  She also had additional workup at [facility]. She was placed on Plavix in addition to aspirin. I do have the discharge summary from 05/27/2013 through 05/31/2013. Principal diagnosis was sepsis at the time, d/t UTI. She was discharged to SNF.  She was then admitted again to [facility] on 06/11/2012 with altered mental status. She had brain MRI which showed moderate cerebral atrophy with mild to moderate chronic microvascular changes and she had carotid Doppler studies which did not show any hemodynamically significant ICA stenoses. I reviewed her brain MRI myself on CD. She was  treated with IV fluids and was discharged back to nursing home, at [facility], where she stayed for 3 weeks, discharged about 2 weeks. She was told that she may have had a TIA. She reports feeling better, she still takes ASA and Plavix. She says, that the second time she was admitted, she was not getting her xanax. She is taking Xanax 1 mg qHS. She is getting outpatient OT and will finish next week. She finished outpt PT and walks without a cane or walker. She feels at baseline. She snores mildly intermittently and no apneas are reported nor gasping sensations for air.   Her Past Medical History Is Significant For: Past Medical History  Diagnosis Date  . Hypothyroidism   . Anxiety   . Arthritis     Bilateral Knee DJD  . Headache(784.0)     SINUS HEADACHE OCCASSIONALLY  . Vertigo     Her Past Surgical History Is Significant For: Past Surgical History  Procedure Laterality Date  . Joint replacement  11/08/3010    right total knee  . Tubal ligation  1982  . Total knee arthroplasty  11/07/2011    Procedure: TOTAL KNEE ARTHROPLASTY;  Surgeon: Nilda Simmer, MD;  Location: [facility] OR;  Service: Orthopedics;  Laterality: Left;  DR Thurston Hole WANTS 90 MINUTES FOR THIS CASE    Her Family History Is Significant For: Family History  Problem Relation Age of Onset  . Heart attack Mother   . COPD Father   . Arthritis Sister   . Heart disease Brother     Her Social History Is Significant For: History   Social History  . Marital Status: Married    Spouse Name: N/A  Number of Children: N/A  . Years of Education: N/A   Social History Main Topics  . Smoking status: Former Smoker -- 1.00 packs/day for 48 years    Types: Cigarettes    Quit date: 06/15/2012  . Smokeless tobacco: None  . Alcohol Use: No  . Drug Use: No  . Sexual Activity: No   Other Topics Concern  . None   Social History Narrative  . None    Her Allergies Are:  Allergies  Allergen Reactions  . Oxycodone Other (See  Comments)    "CX HER TO FELL LIKE SHE IS OUT OF HER BODY"  :   Her Current Medications Are:  Outpatient Encounter Prescriptions as of 07/09/2013  Medication Sig  . alendronate (FOSAMAX) 70 MG tablet Take 70 mg by mouth every 7 (seven) days.   . ALPRAZolam (XANAX) 1 MG tablet Take 1 mg by mouth at bedtime. Prescribed one tablet three times daily as needed For anxiety  . aspirin EC 81 MG tablet Take 81 mg by mouth daily.  . cefUROXime (CEFTIN) 500 MG tablet Take 1 tablet (500 mg total) by mouth 2 (two) times daily with a meal.  . clopidogrel (PLAVIX) 75 MG tablet Take 1 tablet by mouth daily.  . fexofenadine (ALLEGRA) 180 MG tablet Take 1 tablet (180 mg total) by mouth once a week.  Marland Kitchen HYDROcodone-acetaminophen (NORCO/VICODIN) 5-325 MG per tablet Take 1-2 tablets by mouth every 4 (four) hours as needed for moderate pain.  Marland Kitchen levothyroxine (SYNTHROID, LEVOTHROID) 100 MCG tablet Take 100 mcg by mouth every morning.   . [DISCONTINUED] azithromycin (ZITHROMAX) 250 MG tablet Take 2 tabs daily.  . [DISCONTINUED] predniSONE (DELTASONE) 10 MG tablet Take 4 tabs 11/15, take 3 tabs 11/16, take 2 tabs 11/17 and take 1 tab 11/18 then stop.  :   Review of Systems:  Out of a complete 14 point review of systems, all are reviewed and negative with the exception of these symptoms as listed below:   Review of Systems  HENT: Negative.   Eyes: Negative.   Respiratory: Negative.   Cardiovascular: Negative.   Gastrointestinal: Negative.   Endocrine: Negative.   Genitourinary: Negative.   Musculoskeletal: Negative.   Skin: Negative.   Allergic/Immunologic: Negative.   Neurological: Positive for weakness.       Memory loss  Hematological: Negative.   Psychiatric/Behavioral: Negative.     Objective:  Neurologic Exam  Physical Exam Physical Examination:   Filed Vitals:   07/09/13 1315  BP: 135/72  Pulse: 124  Temp:    General Examination: The patient is a very pleasant 67 y.o. female in no  acute distress. She appears well-developed and well-nourished and well groomed. She is overweight.   HEENT: Normocephalic, atraumatic, pupils are equal, round and reactive to light and accommodation. Funduscopic exam is normal with sharp disc margins noted. Extraocular tracking is good without limitation to gaze excursion or nystagmus noted. Normal smooth pursuit is noted. Hearing is grossly intact. Tympanic membranes are clear bilaterally. Face is symmetric with normal facial animation and normal facial sensation. Speech is clear with no dysarthria noted. There is no hypophonia. There is no lip, neck/head, jaw or voice tremor. Neck is supple with full range of passive and active motion. There are no carotid bruits on auscultation. Oropharynx exam reveals: mild mouth dryness, adequate dental hygiene and mild airway crowding, due to narrow airway. Mallampati is class II. Tongue protrudes centrally and palate elevates symmetrically.    Chest: Clear to auscultation without wheezing,  rhonchi or crackles noted.  Heart: S1+S2+0, regular and normal without murmurs, rubs or gallops noted.   Abdomen: Soft, non-tender and non-distended with normal bowel sounds appreciated on auscultation.  Extremities: There is trace pitting edema in the distal lower extremities bilaterally. Pedal pulses are intact.  Skin: Warm and dry without trophic changes noted.   Musculoskeletal: exam reveals no obvious joint deformities, tenderness or joint swelling or erythema, She reports right knee pain.   Neurologically:  Mental status: The patient is awake, alert and oriented in all 4 spheres. Her memory, attention, language and knowledge are appropriate. There is no aphasia, agnosia, apraxia or anomia. Speech is clear with normal prosody and enunciation. Thought process is linear. Mood is congruent and affect is normal.  Cranial nerves are as described above under HEENT exam. In addition, shoulder shrug is normal with equal  shoulder height noted. Motor exam: Normal bulk, strength and tone is noted. There is no drift, tremor or rebound. Romberg is negative. Reflexes are 2+ throughout. Toes are downgoing bilaterally. Fine motor skills are intact with normal finger taps, normal hand movements, normal rapid alternating patting, normal foot taps and normal foot agility.  Cerebellar testing shows no dysmetria or intention tremor on finger to nose testing. Heel to shin is unremarkable bilaterally. There is no truncal or gait ataxia.  Sensory exam is intact to light touch, pinprick, vibration, temperature sense in the upper and lower extremities.  Gait, station and balance: She stands up well but stands slightly wide-based. She walks independently with preserved arm swing but slightly cautiously. She turns into steps. She is able to stand on her toes and heels but tandem walk is difficult for her and she's not able to do this completely on her own.  Assessment and Plan:   In summary, Ayn P Wandrey is a very pleasant 67 y.o.-year old female with a history of gait disorder. She had recent altered mental status in the context of a recent admission for urosepsis and perhaps some changes in her medications. These issues have resolved. She is advised about TIA and secondary stroke prevention. I'm not convinced based on both discharge summaries that she actually had a TIA. She has not a whole lot of cardiovascular risk factors. She is advised at this time to just keep him going with the Plavix but does not have to take Plavix and aspirin at this time. We talked about general health measures including weight management. She has no significant risk factors for OSA. She has ongoing occupational therapy and finished physical therapy. I think or gait dysfunction is a combination of things including arthritis, cerebrovascular atherosclerosis, recent dehydration and deconditioning as well as urosepsis. She does not have telltale history or  findings consistent with normal pressure hydrocephalus although she does have widening of both lateral ventricles. She has atrophy as well. We talked about her brain MRI findings. I asked her to continue to be active. She is advised to continue regular checkups with you as previously scheduled. At this juncture, I do not believe I have a whole lot to add. To that end I have asked her to followup with me on an as-needed basis. I provided her with instructions in writing and answered their questions today. They were in agreement.  Thank you very much for allowing me to participate in the care of this nice patient. If I can be of any further assistance to you please do not hesitate to call me at [phone].  Sincerely,   Amanda Foley,  MD, PhD

## 2013-08-02 ENCOUNTER — Encounter (HOSPITAL_COMMUNITY): Payer: Self-pay | Admitting: Emergency Medicine

## 2013-08-02 ENCOUNTER — Inpatient Hospital Stay (HOSPITAL_COMMUNITY)
Admission: EM | Admit: 2013-08-02 | Discharge: 2013-08-06 | DRG: 641 | Disposition: A | Discharge status: Home Health | Payer: Medicare HMO | Attending: Internal Medicine | Admitting: Internal Medicine

## 2013-08-02 ENCOUNTER — Emergency Department (HOSPITAL_COMMUNITY): Payer: Medicare HMO

## 2013-08-02 ENCOUNTER — Inpatient Hospital Stay (HOSPITAL_COMMUNITY): Payer: Medicare HMO

## 2013-08-02 DIAGNOSIS — R739 Hyperglycemia, unspecified: Secondary | ICD-10-CM

## 2013-08-02 DIAGNOSIS — R0682 Tachypnea, not elsewhere classified: Secondary | ICD-10-CM

## 2013-08-02 DIAGNOSIS — R000 Tachycardia, unspecified: Secondary | ICD-10-CM

## 2013-08-02 DIAGNOSIS — M1710 Unilateral primary osteoarthritis, unspecified knee: Secondary | ICD-10-CM | POA: Diagnosis present

## 2013-08-02 DIAGNOSIS — R5383 Other fatigue: Secondary | ICD-10-CM

## 2013-08-02 DIAGNOSIS — R531 Weakness: Secondary | ICD-10-CM | POA: Diagnosis present

## 2013-08-02 DIAGNOSIS — R197 Diarrhea, unspecified: Secondary | ICD-10-CM

## 2013-08-02 DIAGNOSIS — E860 Dehydration: Principal | ICD-10-CM | POA: Diagnosis present

## 2013-08-02 DIAGNOSIS — R5381 Other malaise: Secondary | ICD-10-CM

## 2013-08-02 DIAGNOSIS — D72829 Elevated white blood cell count, unspecified: Secondary | ICD-10-CM

## 2013-08-02 DIAGNOSIS — E039 Hypothyroidism, unspecified: Secondary | ICD-10-CM

## 2013-08-02 DIAGNOSIS — Z8249 Family history of ischemic heart disease and other diseases of the circulatory system: Secondary | ICD-10-CM

## 2013-08-02 DIAGNOSIS — N10 Acute tubulo-interstitial nephritis: Secondary | ICD-10-CM

## 2013-08-02 DIAGNOSIS — Z96659 Presence of unspecified artificial knee joint: Secondary | ICD-10-CM

## 2013-08-02 DIAGNOSIS — A0472 Enterocolitis due to Clostridium difficile, not specified as recurrent: Secondary | ICD-10-CM

## 2013-08-02 DIAGNOSIS — M1990 Unspecified osteoarthritis, unspecified site: Secondary | ICD-10-CM

## 2013-08-02 DIAGNOSIS — I959 Hypotension, unspecified: Secondary | ICD-10-CM | POA: Diagnosis present

## 2013-08-02 DIAGNOSIS — N390 Urinary tract infection, site not specified: Secondary | ICD-10-CM

## 2013-08-02 DIAGNOSIS — Z87891 Personal history of nicotine dependence: Secondary | ICD-10-CM

## 2013-08-02 DIAGNOSIS — F411 Generalized anxiety disorder: Secondary | ICD-10-CM | POA: Diagnosis present

## 2013-08-02 DIAGNOSIS — F419 Anxiety disorder, unspecified: Secondary | ICD-10-CM

## 2013-08-02 DIAGNOSIS — A419 Sepsis, unspecified organism: Secondary | ICD-10-CM

## 2013-08-02 DIAGNOSIS — E876 Hypokalemia: Secondary | ICD-10-CM | POA: Diagnosis present

## 2013-08-02 HISTORY — DX: Other abnormalities of gait and mobility: R26.89

## 2013-08-02 LAB — CBC WITH DIFFERENTIAL/PLATELET
Basophils Absolute: 0 10*3/uL (ref 0.0–0.1)
Basophils Relative: 0 % (ref 0–1)
Eosinophils Absolute: 0.1 10*3/uL (ref 0.0–0.7)
Eosinophils Relative: 1 % (ref 0–5)
HCT: 36 % (ref 36.0–46.0)
Hemoglobin: 12.3 g/dL (ref 12.0–15.0)
LYMPHS ABS: 1.5 10*3/uL (ref 0.7–4.0)
LYMPHS PCT: 8 % — AB (ref 12–46)
MCH: 30.1 pg (ref 26.0–34.0)
MCHC: 34.2 g/dL (ref 30.0–36.0)
MCV: 88 fL (ref 78.0–100.0)
Monocytes Absolute: 1.2 10*3/uL — ABNORMAL HIGH (ref 0.1–1.0)
Monocytes Relative: 6 % (ref 3–12)
NEUTROS ABS: 15.8 10*3/uL — AB (ref 1.7–7.7)
NEUTROS PCT: 85 % — AB (ref 43–77)
PLATELETS: 271 10*3/uL (ref 150–400)
RBC: 4.09 MIL/uL (ref 3.87–5.11)
RDW: 14.9 % (ref 11.5–15.5)
WBC: 18.6 10*3/uL — AB (ref 4.0–10.5)

## 2013-08-02 LAB — URINALYSIS W MICROSCOPIC + REFLEX CULTURE
Bilirubin Urine: NEGATIVE
GLUCOSE, UA: NEGATIVE mg/dL
Hgb urine dipstick: NEGATIVE
KETONES UR: NEGATIVE mg/dL
Nitrite: NEGATIVE
PH: 7.5 (ref 5.0–8.0)
Protein, ur: NEGATIVE mg/dL
Specific Gravity, Urine: 1.015 (ref 1.005–1.030)
Urobilinogen, UA: 0.2 mg/dL (ref 0.0–1.0)

## 2013-08-02 LAB — BASIC METABOLIC PANEL
BUN: 9 mg/dL (ref 6–23)
CALCIUM: 8.3 mg/dL — AB (ref 8.4–10.5)
CO2: 26 meq/L (ref 19–32)
Chloride: 103 mEq/L (ref 96–112)
Creatinine, Ser: 0.67 mg/dL (ref 0.50–1.10)
GFR calc Af Amer: >90 mL/min (ref >90)
GFR, EST NON AFRICAN AMERICAN: 88 mL/min — AB (ref >90)
Glucose, Bld: 125 mg/dL — ABNORMAL HIGH (ref 70–99)
Potassium: 3.4 mEq/L — ABNORMAL LOW (ref 3.7–5.3)
SODIUM: 142 meq/L (ref 137–147)

## 2013-08-02 LAB — LACTIC ACID, PLASMA: Lactic Acid, Venous: 0.8 mmol/L (ref 0.5–2.2)

## 2013-08-02 LAB — INFLUENZA PANEL BY PCR (TYPE A & B)
H1N1FLUPCR: NOT DETECTED
INFLBPCR: NEGATIVE
Influenza A By PCR: NEGATIVE

## 2013-08-02 LAB — TROPONIN I

## 2013-08-02 MED ORDER — SODIUM CHLORIDE 0.9 % IV BOLUS (SEPSIS): 500.0000 mL | Freq: Once | INTRAVENOUS | Status: DC

## 2013-08-02 MED ORDER — ONDANSETRON HCL 4 MG PO TABS: 4.0000 mg | ORAL_TABLET | Freq: Four times a day (QID) | ORAL | Status: DC | PRN

## 2013-08-02 MED ORDER — ONDANSETRON HCL 4 MG/2ML IJ SOLN: 4.0000 mg | Freq: Four times a day (QID) | INTRAMUSCULAR | Status: DC | PRN

## 2013-08-02 MED ORDER — METRONIDAZOLE IN NACL 5-0.79 MG/ML-% IV SOLN
500.0000 mg | Freq: Three times a day (TID) | INTRAVENOUS | Status: DC
  Administered 2013-08-03 – 2013-08-06 (×11): 500 mg via INTRAVENOUS
  Filled 2013-08-02 (×15): qty 100

## 2013-08-02 MED ORDER — ENOXAPARIN SODIUM 40 MG/0.4ML ~~LOC~~ SOLN
40.0000 mg | SUBCUTANEOUS | Status: DC
  Administered 2013-08-03 – 2013-08-05 (×4): 40 mg via SUBCUTANEOUS
  Filled 2013-08-02 (×4): qty 0.4

## 2013-08-02 MED ORDER — SODIUM CHLORIDE 0.9 % IV BOLUS (SEPSIS)
500.0000 mL | Freq: Once | INTRAVENOUS | Status: AC
  Administered 2013-08-02: 500 mL via INTRAVENOUS

## 2013-08-02 MED ORDER — SODIUM CHLORIDE 0.9 % IV SOLN: INTRAVENOUS | Status: DC

## 2013-08-02 MED ORDER — SODIUM CHLORIDE 0.9 % IV SOLN
INTRAVENOUS | Status: DC
  Administered 2013-08-02 – 2013-08-03 (×2): via INTRAVENOUS

## 2013-08-02 MED ORDER — POTASSIUM CHLORIDE CRYS ER 20 MEQ PO TBCR
40.0000 meq | EXTENDED_RELEASE_TABLET | ORAL | Status: AC
  Administered 2013-08-03 (×2): 40 meq via ORAL
  Filled 2013-08-02 (×2): qty 2

## 2013-08-02 MED ORDER — LEVOTHYROXINE SODIUM 75 MCG PO TABS
75.0000 ug | ORAL_TABLET | Freq: Every day | ORAL | Status: DC
  Administered 2013-08-03 – 2013-08-06 (×4): 75 ug via ORAL
  Filled 2013-08-02 (×4): qty 1

## 2013-08-02 NOTE — ED Notes (Signed)
Patient states she is not going to stay, states no one has told her anything about an admission.

## 2013-08-02 NOTE — ED Notes (Signed)
[facility]

## 2013-08-02 NOTE — ED Notes (Signed)
Pt reports being "so weak i can't walk since yesterday", pt denies any fever or chills, no vomiting or diarrhea. H/o uti in nov 2014, husband thinks she is acting the same at that visit.  Pt denies any urinary complaints.

## 2013-08-02 NOTE — ED Provider Notes (Signed)
CSN: [id number]     Arrival date & time 08/02/13  1532 History   First MD Initiated Contact with Patient 08/02/13 1609     Chief Complaint  Patient presents with  . Weakness    HPI Pt was seen at 1615. Per pt and her husband, c/o gradual onset and persistence of constant generalized weakness for the past 2 days. Pt states she cannot stand nor walk even with her cane or walker due to feeling "too weak." Pt's husband states pt often has this symptom "when she has a UTI." Endorses she has had multiple episodes of diarrhea for the past 1 month which improved 2 days ago after taking lomotil.  Denies fevers, no N/V, no black or blood in stools, no abd pain, no CP/SOB, no cough, no back pain, no focal motor weakness, no tingling/numbness in extremities, no falls.    Past Medical History  Diagnosis Date  . Hypothyroidism   . Anxiety   . Arthritis     Bilateral Knee DJD  . Headache(784.0)     SINUS HEADACHE OCCASSIONALLY  . Vertigo   . Multifactorial gait disorder    Past Surgical History  Procedure Laterality Date  . Joint replacement  11/08/3010    right total knee  . Tubal ligation  1982  . Total knee arthroplasty  11/07/2011    Procedure: TOTAL KNEE ARTHROPLASTY;  Surgeon: Nilda Simmer, MD;  Location: [facility] OR;  Service: Orthopedics;  Laterality: Left;  DR Thurston Hole WANTS 90 MINUTES FOR THIS CASE   Family History  Problem Relation Age of Onset  . Heart attack Mother   . COPD Father   . Arthritis Sister   . Heart disease Brother    History  Substance Use Topics  . Smoking status: Former Smoker -- 1.00 packs/day for 48 years    Types: Cigarettes    Quit date: 06/15/2012  . Smokeless tobacco: Not on file  . Alcohol Use: No    Review of Systems ROS: Statement: All systems negative except as marked or noted in the HPI; Constitutional: +generalized weakness. Negative for fever and chills. ; ; Eyes: Negative for eye pain, redness and discharge. ; ; ENMT: Negative for ear pain,  hoarseness, nasal congestion, sinus pressure and sore throat. ; ; Cardiovascular: Negative for chest pain, palpitations, diaphoresis, dyspnea and peripheral edema. ; ; Respiratory: Negative for cough, wheezing and stridor. ; ; Gastrointestinal: +diarrhea. Negative for nausea, vomiting, abdominal pain, blood in stool, hematemesis, jaundice and rectal bleeding. . ; ; Genitourinary: Negative for dysuria, flank pain and hematuria. ; ; Musculoskeletal: Negative for back pain and neck pain. Negative for swelling and trauma.; ; Skin: Negative for pruritus, rash, abrasions, blisters, bruising and skin lesion.; ; Neuro: Negative for headache, lightheadedness and neck stiffness. Negative for altered level of consciousness , altered mental status, extremity weakness, paresthesias, involuntary movement, seizure and syncope.       Allergies  Oxycodone  Home Medications   Current Outpatient Rx  Name  Route  Sig  Dispense  Refill  . diphenoxylate-atropine (LOMOTIL) 2.5-0.025 MG per tablet   Oral   Take 2 tablets by mouth daily as needed for diarrhea or loose stools.         Marland Kitchen levothyroxine (SYNTHROID, LEVOTHROID) 75 MCG tablet   Oral   Take 75 mcg by mouth daily.         Marland Kitchen tiotropium (SPIRIVA) 18 MCG inhalation capsule   Inhalation   Place 18 mcg into inhaler and inhale daily.         Marland Kitchen  alendronate (FOSAMAX) 70 MG tablet   Oral   Take 70 mg by mouth every 7 (seven) days.           BP 107/68  Pulse 100  Temp(Src) 99.1 F (37.3 C) (Oral)  Resp 16  Ht 4' 10.5" (1.486 m)  Wt 150 lb (68.04 kg)  BMI 30.81 kg/m2  SpO2 96% Physical Exam 1620: Physical examination:  Nursing notes reviewed; Vital signs and O2 SAT reviewed;  Constitutional: Well developed, Well nourished, In no acute distress; Head:  Normocephalic, atraumatic; Eyes: EOMI, PERRL, No scleral icterus; ENMT: Mouth and pharynx normal, Mucous membranes dry; Neck: Supple, Full range of motion, No lymphadenopathy; Cardiovascular:  Regular rate and rhythm, No gallop; Respiratory: Breath sounds clear & equal bilaterally, No wheezes.  Speaking full sentences with ease, Normal respiratory effort/excursion; Chest: Nontender, Movement normal; Abdomen: Soft, Nontender, Nondistended, Normal bowel sounds; Genitourinary: No CVA tenderness; Extremities: Pulses normal, No tenderness, No edema, No calf edema or asymmetry.; Neuro: AA&Ox3, vague historian. Major CN grossly intact. No facial droop. Speech clear. Grips equal. Strength 5/5 bilat UE's and LE's. No gross focal motor or sensory deficits in extremities.; Skin: Color normal, Warm, Dry.   ED Course  Procedures  EKG Interpretation   None       MDM  MDM Reviewed: previous chart, nursing note and vitals Reviewed previous: labs and ECG Interpretation: labs, ECG and x-ray    Date: 08/02/2013  Rate: 100  Rhythm: normal sinus rhythm and premature ventricular contractions (PVC)  QRS Axis: normal  Intervals: normal  ST/T Wave abnormalities: normal  Conduction Disutrbances:none  Narrative Interpretation:   Old EKG Reviewed: unchanged; no significant changes from previous EKG dated 05/27/2013.  Results for orders placed during the hospital encounter of 08/02/13  URINALYSIS W MICROSCOPIC + REFLEX CULTURE      Result Value Range   Color, Urine YELLOW  YELLOW   APPearance CLEAR  CLEAR   Specific Gravity, Urine 1.015  1.005 - 1.030   pH 7.5  5.0 - 8.0   Glucose, UA NEGATIVE  NEGATIVE mg/dL   Hgb urine dipstick NEGATIVE  NEGATIVE   Bilirubin Urine NEGATIVE  NEGATIVE   Ketones, ur NEGATIVE  NEGATIVE mg/dL   Protein, ur NEGATIVE  NEGATIVE mg/dL   Urobilinogen, UA 0.2  0.0 - 1.0 mg/dL   Nitrite NEGATIVE  NEGATIVE   Leukocytes, UA TRACE (*) NEGATIVE   WBC, UA 0-2  <3 WBC/hpf   Bacteria, UA RARE  RARE   Squamous Epithelial / LPF FEW (*) RARE  BASIC METABOLIC PANEL      Result Value Range   Sodium 142  137 - 147 mEq/L   Potassium 3.4 (*) 3.7 - 5.3 mEq/L   Chloride 103   96 - 112 mEq/L   CO2 26  19 - 32 mEq/L   Glucose, Bld 125 (*) 70 - 99 mg/dL   BUN 9  6 - 23 mg/dL   Creatinine, Ser 5.95  0.50 - 1.10 mg/dL   Calcium 8.3 (*) 8.4 - 10.5 mg/dL   GFR calc non Af Amer 88 (*) >90 mL/min   GFR calc Af Amer >90  >90 mL/min  CBC WITH DIFFERENTIAL      Result Value Range   WBC 18.6 (*) 4.0 - 10.5 K/uL   RBC 4.09  3.87 - 5.11 MIL/uL   Hemoglobin 12.3  12.0 - 15.0 g/dL   HCT 63.8  75.6 - 43.3 %   MCV 88.0  78.0 - 100.0 fL  MCH 30.1  26.0 - 34.0 pg   MCHC 34.2  30.0 - 36.0 g/dL   RDW 57.814.9  46.911.5 - 62.915.5 %   Platelets 271  150 - 400 K/uL   Neutrophils Relative % 85 (*) 43 - 77 %   Neutro Abs 15.8 (*) 1.7 - 7.7 K/uL   Lymphocytes Relative 8 (*) 12 - 46 %   Lymphs Abs 1.5  0.7 - 4.0 K/uL   Monocytes Relative 6  3 - 12 %   Monocytes Absolute 1.2 (*) 0.1 - 1.0 K/uL   Eosinophils Relative 1  0 - 5 %   Eosinophils Absolute 0.1  0.0 - 0.7 K/uL   Basophils Relative 0  0 - 1 %   Basophils Absolute 0.0  0.0 - 0.1 K/uL  LACTIC ACID, PLASMA      Result Value Range   Lactic Acid, Venous 0.8  0.5 - 2.2 mmol/L  TROPONIN I      Result Value Range   Troponin I <0.30  <0.30 ng/mL   Dg Chest 2 View 08/02/2013   CLINICAL DATA:  Weakness.  Fever.  EXAM: CHEST  2 VIEW  COMPARISON:  Chest x-ray 06/09/2013.  FINDINGS: Lung volumes are normal. No consolidative airspace disease. No pleural effusions. No pneumothorax. No pulmonary nodule or mass noted. Pulmonary vasculature and the cardiomediastinal silhouette are within normal limits.  IMPRESSION: 1.  No radiographic evidence of acute cardiopulmonary disease.   Electronically Signed   By: Trudie Reedaniel  Entrikin M.D.   On: 08/02/2013 17:17     1930: Continues hypotensive and tachycardic despite IVF bolus. +orthostatic on VS. Remains afebrile. No clear source for leukocytosis; will check influenza panel. No N/V or stooling while in the ED. Dx and testing d/w pt and family.  Questions answered.  Verb understanding, agreeable to admit.  T/C  to [facility] Dr. Sharl MaLama, case discussed, including:  HPI, pertinent PM/SHx, VS/PE, dx testing, ED course and treatment:  Agreeable to admit, requests to write temporary orders, obtain tele bed.       Laray AngerKathleen M Vin Yonke, DO 08/05/13 1133

## 2013-08-02 NOTE — H&P (Signed)
PCP:   Leonides Grills, MD   Chief Complaint:  Weakness  HPI: 68 year old female who   has a past medical history of Hypothyroidism; Anxiety; Arthritis; Headache(784.0); Vertigo; and Multifactorial gait disorder. Represented to the ED with chief complaint of weakness for past 2 days. Patient denies any complaints at this time she denies chest pain no shortness of breath has mild headache no nausea or vomiting but she does admit to having diarrhea for over a month which stopped 2 days ago after she was prescribed Lomotil by her PCP. As per patient she was at the nursing home and since her discharge from the nursing home she has been having loose stools which stopped 2 days ago after she got Lomotil. She denies any abdominal pain complains of mild bloating. Patient complains of generalized weakness and difficulty walking but no specific weakness of arms or legs. In the ED patient was found to have elevated white count of 18,000 and mild hypotension and hypokalemia.  Allergies:   Allergies  Allergen Reactions  . Oxycodone Other (See Comments)    "CX HER TO FELL LIKE SHE IS OUT OF HER BODY"      Past Medical History  Diagnosis Date  . Hypothyroidism   . Anxiety   . Arthritis     Bilateral Knee DJD  . Headache(784.0)     SINUS HEADACHE OCCASSIONALLY  . Vertigo   . Multifactorial gait disorder     Past Surgical History  Procedure Laterality Date  . Joint replacement  11/08/3010    right total knee  . Tubal ligation  1982  . Total knee arthroplasty  11/07/2011    Procedure: TOTAL KNEE ARTHROPLASTY;  Surgeon: Lorn Junes, MD;  Location: [facility];  Service: Orthopedics;  Laterality: Left;  DR Ephriam Knuckles 90 MINUTES FOR THIS CASE    Prior to Admission medications   Medication Sig Start Date End Date Taking? Authorizing Provider  diphenoxylate-atropine (LOMOTIL) 2.5-0.025 MG per tablet Take 2 tablets by mouth daily as needed for diarrhea or loose stools.   Yes Historical Provider,  MD  levothyroxine (SYNTHROID, LEVOTHROID) 75 MCG tablet Take 75 mcg by mouth daily.   Yes Historical Provider, MD  tiotropium (SPIRIVA) 18 MCG inhalation capsule Place 18 mcg into inhaler and inhale daily.   Yes Historical Provider, MD  alendronate (FOSAMAX) 70 MG tablet Take 70 mg by mouth every 7 (seven) days.  04/05/13   Historical Provider, MD    Social History:  reports that she quit smoking about 13 months ago. Her smoking use included Cigarettes. She has a 48 pack-year smoking history. She does not have any smokeless tobacco history on file. She reports that she does not drink alcohol or use illicit drugs.  Family History  Problem Relation Age of Onset  . Heart attack Mother   . COPD Father   . Arthritis Sister   . Heart disease Brother      All the positives are listed in BOLD  Review of Systems:  HEENT: Headache, blurred vision, runny nose, sore throat Neck: Hypothyroidism, hyperthyroidism,,lymphadenopathy Chest : Shortness of breath, history of COPD, Asthma Heart : Chest pain, history of coronary arterey disease GI:  Nausea, vomiting, diarrhea, constipation, GERD GU: Dysuria, urgency, frequency of urination, hematuria Neuro: Stroke, seizures, syncope Psych: Depression, anxiety, hallucinations   Physical Exam: Blood pressure 123/49, pulse 108, temperature 99.4 F (37.4 C), temperature source Oral, resp. rate 22, height 4' 10.5" (1.486 m), weight 68.04 kg (150 lb), SpO2 92.00%. Constitutional:  Patient is a well-developed and well-nourished female* in no acute distress and cooperative with exam. Head: Normocephalic and atraumatic Mouth: Mucus membranes moist Eyes: PERRL, EOMI, conjunctivae normal Neck: Supple, No Thyromegaly Cardiovascular: RRR, S1 normal, S2 normal Pulmonary/Chest: CTAB, no wheezes, rales, or rhonchi Abdominal: Soft. Non-tender, abdominal distention, bowel sounds are normal, no masses, organomegaly, or guarding present.  Neurological: A&O x3,  Strenght is normal and symmetric bilaterally, cranial nerve II-XII are grossly intact, no focal motor deficit, sensory intact to light touch bilaterally.  Extremities : No Cyanosis, Clubbing or Edema   Labs on Admission:  Results for orders placed during the hospital encounter of 08/02/13 (from the past 48 hour(s))  BASIC METABOLIC PANEL     Status: Abnormal   Collection Time    08/02/13  4:46 PM      Result Value Range   Sodium 142  137 - 147 mEq/L   Potassium 3.4 (*) 3.7 - 5.3 mEq/L   Chloride 103  96 - 112 mEq/L   CO2 26  19 - 32 mEq/L   Glucose, Bld 125 (*) 70 - 99 mg/dL   BUN 9  6 - 23 mg/dL   Creatinine, Ser 0.67  0.50 - 1.10 mg/dL   Calcium 8.3 (*) 8.4 - 10.5 mg/dL   GFR calc non Af Amer 88 (*) >90 mL/min   GFR calc Af Amer >90  >90 mL/min   Comment: (NOTE)     The eGFR has been calculated using the CKD EPI equation.     This calculation has not been validated in all clinical situations.     eGFR's persistently <90 mL/min signify possible Chronic Kidney     Disease.  CBC WITH DIFFERENTIAL     Status: Abnormal   Collection Time    08/02/13  4:46 PM      Result Value Range   WBC 18.6 (*) 4.0 - 10.5 K/uL   RBC 4.09  3.87 - 5.11 MIL/uL   Hemoglobin 12.3  12.0 - 15.0 g/dL   HCT 36.0  36.0 - 46.0 %   MCV 88.0  78.0 - 100.0 fL   MCH 30.1  26.0 - 34.0 pg   MCHC 34.2  30.0 - 36.0 g/dL   RDW 14.9  11.5 - 15.5 %   Platelets 271  150 - 400 K/uL   Neutrophils Relative % 85 (*) 43 - 77 %   Neutro Abs 15.8 (*) 1.7 - 7.7 K/uL   Lymphocytes Relative 8 (*) 12 - 46 %   Lymphs Abs 1.5  0.7 - 4.0 K/uL   Monocytes Relative 6  3 - 12 %   Monocytes Absolute 1.2 (*) 0.1 - 1.0 K/uL   Eosinophils Relative 1  0 - 5 %   Eosinophils Absolute 0.1  0.0 - 0.7 K/uL   Basophils Relative 0  0 - 1 %   Basophils Absolute 0.0  0.0 - 0.1 K/uL  TROPONIN I     Status: None   Collection Time    08/02/13  4:46 PM      Result Value Range   Troponin I <0.30  <0.30 ng/mL   Comment:            Due to  the release kinetics of cTnI,     a negative result within the first hours     of the onset of symptoms does not rule out     myocardial infarction with certainty.     If myocardial infarction is still suspected,  repeat the test at appropriate intervals.  LACTIC ACID, PLASMA     Status: None   Collection Time    08/02/13  4:47 PM      Result Value Range   Lactic Acid, Venous 0.8  0.5 - 2.2 mmol/L  URINALYSIS W MICROSCOPIC + REFLEX CULTURE     Status: Abnormal   Collection Time    08/02/13  5:31 PM      Result Value Range   Color, Urine YELLOW  YELLOW   APPearance CLEAR  CLEAR   Specific Gravity, Urine 1.015  1.005 - 1.030   pH 7.5  5.0 - 8.0   Glucose, UA NEGATIVE  NEGATIVE mg/dL   Hgb urine dipstick NEGATIVE  NEGATIVE   Bilirubin Urine NEGATIVE  NEGATIVE   Ketones, ur NEGATIVE  NEGATIVE mg/dL   Protein, ur NEGATIVE  NEGATIVE mg/dL   Urobilinogen, UA 0.2  0.0 - 1.0 mg/dL   Nitrite NEGATIVE  NEGATIVE   Leukocytes, UA TRACE (*) NEGATIVE   WBC, UA 0-2  <3 WBC/hpf   Bacteria, UA RARE  RARE   Squamous Epithelial / LPF FEW (*) RARE    Radiological Exams on Admission: Dg Chest 2 View  08/02/2013   CLINICAL DATA:  Weakness.  Fever.  EXAM: CHEST  2 VIEW  COMPARISON:  Chest x-ray 06/09/2013.  FINDINGS: Lung volumes are normal. No consolidative airspace disease. No pleural effusions. No pneumothorax. No pulmonary nodule or mass noted. Pulmonary vasculature and the cardiomediastinal silhouette are within normal limits.  IMPRESSION: 1.  No radiographic evidence of acute cardiopulmonary disease.   Electronically Signed   By: Vinnie Langton M.D.   On: 08/02/2013 17:17    Assessment/Plan Principal Problem:   Weakness Active Problems:   Hypotension   Diarrhea  Weakness- likely due to dehydration secondary to long-standing diarrhea. We'll continue the IV fluids.  Hypokalemia-Will replace potassium and check BMP in the morning  Abdominal distention- will obtain abdominal x-ray to  rule out underlying ileus versus SBO.  Diarrhea- patient had diarrhea for a month, was not tested for C. Difficile. We'll check C. difficile PCR and start empirically on Flagyl 500 IV every 8 hours.  Hypothyroidism- continue with Synthroid  Code status: Patient is full code  Family discussion: Discussed with husband and daughter at bedside   Time Spent on Admission: 60 minutes  [facility] Pager: [phone] 08/02/2013, 9:54 PM  If 7PM-7AM, please contact night-coverage  [email or web address]  Password [email or web address]

## 2013-08-03 DIAGNOSIS — N10 Acute tubulo-interstitial nephritis: Secondary | ICD-10-CM

## 2013-08-03 LAB — COMPREHENSIVE METABOLIC PANEL
ALK PHOS: 94 U/L (ref 39–117)
ALT: 15 U/L (ref 0–35)
AST: 18 U/L (ref 0–37)
Albumin: 3.4 g/dL — ABNORMAL LOW (ref 3.5–5.2)
BUN: 6 mg/dL (ref 6–23)
CO2: 23 meq/L (ref 19–32)
Calcium: 8.3 mg/dL — ABNORMAL LOW (ref 8.4–10.5)
Chloride: 106 mEq/L (ref 96–112)
Creatinine, Ser: 0.63 mg/dL (ref 0.50–1.10)
GFR calc non Af Amer: 89 mL/min — ABNORMAL LOW (ref >90)
Glucose, Bld: 90 mg/dL (ref 70–99)
POTASSIUM: 4 meq/L (ref 3.7–5.3)
Sodium: 142 mEq/L (ref 137–147)
TOTAL PROTEIN: 6.9 g/dL (ref 6.0–8.3)
Total Bilirubin: 0.7 mg/dL (ref 0.3–1.2)

## 2013-08-03 LAB — CLOSTRIDIUM DIFFICILE BY PCR: Toxigenic C. Difficile by PCR: POSITIVE — AB

## 2013-08-03 LAB — CBC
HCT: 37.8 % (ref 36.0–46.0)
HEMOGLOBIN: 12.2 g/dL (ref 12.0–15.0)
MCH: 28.8 pg (ref 26.0–34.0)
MCHC: 32.3 g/dL (ref 30.0–36.0)
MCV: 89.4 fL (ref 78.0–100.0)
Platelets: 306 10*3/uL (ref 150–400)
RBC: 4.23 MIL/uL (ref 3.87–5.11)
RDW: 15.4 % (ref 11.5–15.5)
WBC: 19.3 10*3/uL — ABNORMAL HIGH (ref 4.0–10.5)

## 2013-08-03 MED ORDER — ALPRAZOLAM 0.5 MG PO TABS: 0.5000 mg | ORAL_TABLET | Freq: Every evening | ORAL | Status: DC | PRN

## 2013-08-03 MED ORDER — ALPRAZOLAM 1 MG PO TABS
1.0000 mg | ORAL_TABLET | Freq: Three times a day (TID) | ORAL | Status: DC | PRN
  Administered 2013-08-03 – 2013-08-05 (×4): 1 mg via ORAL
  Filled 2013-08-03 (×4): qty 1

## 2013-08-03 MED ORDER — ALPRAZOLAM 0.5 MG PO TABS
0.5000 mg | ORAL_TABLET | Freq: Once | ORAL | Status: DC
  Filled 2013-08-03: qty 1

## 2013-08-03 MED ORDER — METRONIDAZOLE IN NACL 5-0.79 MG/ML-% IV SOLN
INTRAVENOUS | Status: AC
  Filled 2013-08-03: qty 200

## 2013-08-03 MED ORDER — ALBUTEROL SULFATE (2.5 MG/3ML) 0.083% IN NEBU: 2.5000 mg | INHALATION_SOLUTION | RESPIRATORY_TRACT | Status: DC | PRN

## 2013-08-03 NOTE — Progress Notes (Addendum)
Notified Dr. York SpanielBuriev about the patients positive C.Diff results.  The patient is currently on Flagyl no new orders given at this time.  New orders actually placed in the computer.

## 2013-08-03 NOTE — Progress Notes (Addendum)
[facility] PROGRESS NOTE  Amanda Armstrong:[id number] DOB: 25-Dec-1945 DOA: 08/02/2013 PCP: [name], MD  Assessment/Plan: 68 year old female who has a past medical history of Hypothyroidism; Anxiety; Arthritis; Headache(784.0); Vertigo; and Multifactorial gait disorder presented to the ED with chief complaint of weakness, diarrhea for over a month  Principal Problem:  Weakness  Active Problems:  Hypotension  Diarrhea   1. Weakness- likely due to dehydration secondary to long-standing diarrhea. Influenza neg -continue the IV fluids,   2. Hypokalemia -replace potassium and check BMP in the morning   3. Abdominal pain with diarrhea/distention; KUB: Unremarkable bowel gas pattern; no free intra-abdominal air seen; exam no s/s of acute abdomen -check C. difficile PCR and start empirically on Flagyl 500 IV every 8 hours.   4. Hypothyroidism- continue with Synthroid  Code Status: full Family Communication: d/w patient; updated [name], [name] [phone]  (indicate person spoken with, relationship, and if by phone, the number) Disposition Plan: pend clinical improvement    Consultants:  None   Procedures:  None   Antibiotics:   Flagyl 1/17<<<   (indicate start date, and stop date if known)  HPI/Subjective: alert  Objective: Filed Vitals:   08/03/13 0555  BP: 117/62  Pulse: 101  Temp: 98.1 F (36.7 C)  Resp: 20    Intake/Output Summary (Last 24 hours) at 08/03/13 0826 Last data filed at 08/03/13 0601  Gross per 24 hour  Intake      0 ml  Output    601 ml  Net   -601 ml   Filed Weights   08/02/13 1605 08/02/13 2230  Weight: 68.04 kg (150 lb) 70.171 kg (154 lb 11.2 oz)    Exam:   General:  alert  Cardiovascular: s1,s2 rrr  Respiratory: CTA BL  Abdomen: soft, nt, distended   Musculoskeletal: no edema   Data Reviewed: Basic Metabolic Panel:  Recent Labs Lab 08/02/13 1646 08/03/13 0601  NA 142 142  K 3.4* 4.0  CL  103 106  CO2 26 23  GLUCOSE 125* 90  BUN 9 6  CREATININE 0.67 0.63  CALCIUM 8.3* 8.3*   Liver Function Tests:  Recent Labs Lab 08/03/13 0601  AST 18  ALT 15  ALKPHOS 94  BILITOT 0.7  PROT 6.9  ALBUMIN 3.4*   No results found for this basename: LIPASE, AMYLASE,  in the last 168 hours No results found for this basename: AMMONIA,  in the last 168 hours CBC:  Recent Labs Lab 08/02/13 1646 08/03/13 0601  WBC 18.6* 19.3*  NEUTROABS 15.8*  --   HGB 12.3 12.2  HCT 36.0 37.8  MCV 88.0 89.4  PLT 271 306   Cardiac Enzymes:  Recent Labs Lab 08/02/13 1646  TROPONINI <0.30   BNP (last 3 results)  Recent Labs  05/28/13 0545  PROBNP 1179.0*   CBG: No results found for this basename: GLUCAP,  in the last 168 hours  No results found for this or any previous visit (from the past 240 hour(s)).   Studies: Dg Chest 2 View  08/02/2013   CLINICAL DATA:  Weakness.  Fever.  EXAM: CHEST  2 VIEW  COMPARISON:  Chest x-ray 06/09/2013.  FINDINGS: Lung volumes are normal. No consolidative airspace disease. No pleural effusions. No pneumothorax. No pulmonary nodule or mass noted. Pulmonary vasculature and the cardiomediastinal silhouette are within normal limits.  IMPRESSION: 1.  No radiographic evidence of acute cardiopulmonary disease.   Electronically Signed   By: Trudie Reed M.D.   On: 08/02/2013  17:17   Dg Abd 2 Views  08/03/2013   CLINICAL DATA:  Abdominal pain, distention, nausea and shortness of breath.  EXAM: ABDOMEN - 2 VIEW  COMPARISON:  CT of the abdomen and pelvis performed 05/28/2013  FINDINGS: The visualized bowel gas pattern is unremarkable. Scattered air filled loops of colon are seen; no abnormal dilatation of small bowel loops is seen to suggest small bowel obstruction. No free intra-abdominal air is identified, though evaluation for free air is limited on a single supine view.  The visualized osseous structures are within normal limits; the sacroiliac joints are  unremarkable in appearance. The visualized portions of the lungs are essentially clear.  IMPRESSION: Unremarkable bowel gas pattern; no free intra-abdominal air seen.   Electronically Signed   By: Roanna RaiderJeffery  Chang M.D.   On: 08/03/2013 02:09    Scheduled Meds: . ALPRAZolam  0.5 mg Oral Once  . enoxaparin (LOVENOX) injection  40 mg Subcutaneous Q24H  . levothyroxine  75 mcg Oral QAC breakfast  . metronidazole  500 mg Intravenous Q8H   Continuous Infusions: . sodium chloride 75 mL/hr at 08/03/13 0547    Principal Problem:   Weakness Active Problems:   Hypotension   Diarrhea    Time spent: >35 minutes     Esperanza Armstrong, Amanda Armstrong  [facility] Pager [phone]. If 7PM-7AM, please contact night-coverage at [email or web address], password [email or web address] 08/03/2013, 8:26 AM  LOS: 1 day

## 2013-08-03 NOTE — Progress Notes (Addendum)
Notified Dr. Wonda AmisBureiv to ask him if this patient could have breathing treatments ordered due to the patients lung sounds with assessment had wheezing bilateral lung fields and were diminished.  He verbalized understanding and stated he would order the breathing treatment.

## 2013-08-03 NOTE — Progress Notes (Signed)
Patient said she uses Spiriva daily but does not use nebs at home.  Charlott HollerMike Dionta Larke RT

## 2013-08-03 NOTE — Progress Notes (Signed)
Notified the MD of the patients concerns of wanting to eat and of wanting to have xanax ordered from home.  New orders given and followed.  I voiced to him I clarified the xanax order with the [facility] in  [place] , and according to the tech there she stated that the patient takes xanax  1 mg tid.  He stated to change the order.  I will continue to monitor her.

## 2013-08-04 DIAGNOSIS — A0472 Enterocolitis due to Clostridium difficile, not specified as recurrent: Secondary | ICD-10-CM

## 2013-08-04 LAB — CBC
HCT: 33.8 % — ABNORMAL LOW (ref 36.0–46.0)
Hemoglobin: 11.4 g/dL — ABNORMAL LOW (ref 12.0–15.0)
MCH: 29.8 pg (ref 26.0–34.0)
MCHC: 33.7 g/dL (ref 30.0–36.0)
MCV: 88.3 fL (ref 78.0–100.0)
PLATELETS: 263 10*3/uL (ref 150–400)
RBC: 3.83 MIL/uL — ABNORMAL LOW (ref 3.87–5.11)
RDW: 15.1 % (ref 11.5–15.5)
WBC: 11.3 10*3/uL — ABNORMAL HIGH (ref 4.0–10.5)

## 2013-08-04 LAB — BASIC METABOLIC PANEL
BUN: 4 mg/dL — AB (ref 6–23)
CO2: 23 mEq/L (ref 19–32)
Calcium: 7.7 mg/dL — ABNORMAL LOW (ref 8.4–10.5)
Chloride: 110 mEq/L (ref 96–112)
Creatinine, Ser: 0.58 mg/dL (ref 0.50–1.10)
Glucose, Bld: 91 mg/dL (ref 70–99)
POTASSIUM: 3.2 meq/L — AB (ref 3.7–5.3)
SODIUM: 144 meq/L (ref 137–147)

## 2013-08-04 MED ORDER — POTASSIUM CHLORIDE IN NACL 20-0.9 MEQ/L-% IV SOLN
INTRAVENOUS | Status: DC
  Administered 2013-08-04: 50 mL/h via INTRAVENOUS
  Administered 2013-08-05: 06:00:00 via INTRAVENOUS

## 2013-08-04 MED ORDER — POTASSIUM CHLORIDE CRYS ER 20 MEQ PO TBCR
40.0000 meq | EXTENDED_RELEASE_TABLET | Freq: Once | ORAL | Status: AC
  Administered 2013-08-04: 40 meq via ORAL
  Filled 2013-08-04: qty 2

## 2013-08-04 NOTE — Progress Notes (Signed)
[facility] PROGRESS NOTE  Trey SailorsRebekah P Canal ZOX:[id number] DOB: May 01, 1946 DOA: 08/02/2013 PCP: Kirk RuthsMCGOUGH,WILLIAM M, MD  Assessment/Plan: 68 year old female who has a past medical history of Hypothyroidism; Anxiety; Arthritis; Headache(784.0); Vertigo; and Multifactorial gait disorder presented to the ED with chief complaint of weakness, diarrhea for over a month  Principal Problem:  Weakness  Active Problems:  Hypotension  Diarrhea   1. Weakness- likely due to dehydration secondary to long-standing diarrhea. Influenza neg -improving on IV fluids  2. Hypokalemia -replace potassium and check BMP in the morning   3. Abdominal pain with diarrhea/distention; KUB: Unremarkable bowel gas pattern; no free intra-abdominal air seen; exam no s/s of acute abdomen + C. difficile PCR; on Flagyl 500 IV every 8 hours.   4. Hypothyroidism- continue with Synthroid  Code Status: full Family Communication: d/w patient; updated Joline Salterdue,James W Spouse [phone]  (indicate person spoken with, relationship, and if by phone, the number) Disposition Plan: pend clinical improvement    Consultants:  None   Procedures:  None   Antibiotics:   Flagyl 1/17<<<   (indicate start date, and stop date if known)  HPI/Subjective: alert  Objective: Filed Vitals:   08/04/13 0441  BP: 135/76  Pulse: 79  Temp: 98.3 F (36.8 C)  Resp: 20    Intake/Output Summary (Last 24 hours) at 08/04/13 0816 Last data filed at 08/04/13 0236  Gross per 24 hour  Intake   1440 ml  Output      5 ml  Net   1435 ml   Filed Weights   08/02/13 1605 08/02/13 2230  Weight: 68.04 kg (150 lb) 70.171 kg (154 lb 11.2 oz)    Exam:   General:  alert  Cardiovascular: s1,s2 rrr  Respiratory: CTA BL  Abdomen: soft, nt, distended   Musculoskeletal: no edema   Data Reviewed: Basic Metabolic Panel:  Recent Labs Lab 08/02/13 1646 08/03/13 0601 08/04/13 0546  NA 142 142 144  K 3.4* 4.0 3.2*  CL 103  106 110  CO2 26 23 23   GLUCOSE 125* 90 91  BUN 9 6 4*  CREATININE 0.67 0.63 0.58  CALCIUM 8.3* 8.3* 7.7*   Liver Function Tests:  Recent Labs Lab 08/03/13 0601  AST 18  ALT 15  ALKPHOS 94  BILITOT 0.7  PROT 6.9  ALBUMIN 3.4*   No results found for this basename: LIPASE, AMYLASE,  in the last 168 hours No results found for this basename: AMMONIA,  in the last 168 hours CBC:  Recent Labs Lab 08/02/13 1646 08/03/13 0601 08/04/13 0546  WBC 18.6* 19.3* 11.3*  NEUTROABS 15.8*  --   --   HGB 12.3 12.2 11.4*  HCT 36.0 37.8 33.8*  MCV 88.0 89.4 88.3  PLT 271 306 263   Cardiac Enzymes:  Recent Labs Lab 08/02/13 1646  TROPONINI <0.30   BNP (last 3 results)  Recent Labs  05/28/13 0545  PROBNP 1179.0*   CBG: No results found for this basename: GLUCAP,  in the last 168 hours  Recent Results (from the past 240 hour(s))  CLOSTRIDIUM DIFFICILE BY PCR     Status: Abnormal   Collection Time    08/03/13  8:33 AM      Result Value Range Status   C difficile by pcr POSITIVE (*) NEGATIVE Final   Comment: RESULT CALLED TO, READ BACK BY AND VERIFIED WITH:     T. WATKINS AT 1605 ON 08/03/2013 BY S.VANHOORNE     Studies: Dg Chest 2 View  08/02/2013  CLINICAL DATA:  Weakness.  Fever.  EXAM: CHEST  2 VIEW  COMPARISON:  Chest x-ray 06/09/2013.  FINDINGS: Lung volumes are normal. No consolidative airspace disease. No pleural effusions. No pneumothorax. No pulmonary nodule or mass noted. Pulmonary vasculature and the cardiomediastinal silhouette are within normal limits.  IMPRESSION: 1.  No radiographic evidence of acute cardiopulmonary disease.   Electronically Signed   By: Trudie Reed M.D.   On: 08/02/2013 17:17   Dg Abd 2 Views  08/03/2013   CLINICAL DATA:  Abdominal pain, distention, nausea and shortness of breath.  EXAM: ABDOMEN - 2 VIEW  COMPARISON:  CT of the abdomen and pelvis performed 05/28/2013  FINDINGS: The visualized bowel gas pattern is unremarkable. Scattered  air filled loops of colon are seen; no abnormal dilatation of small bowel loops is seen to suggest small bowel obstruction. No free intra-abdominal air is identified, though evaluation for free air is limited on a single supine view.  The visualized osseous structures are within normal limits; the sacroiliac joints are unremarkable in appearance. The visualized portions of the lungs are essentially clear.  IMPRESSION: Unremarkable bowel gas pattern; no free intra-abdominal air seen.   Electronically Signed   By: Roanna Raider M.D.   On: 08/03/2013 02:09    Scheduled Meds: . ALPRAZolam  0.5 mg Oral Once  . enoxaparin (LOVENOX) injection  40 mg Subcutaneous Q24H  . levothyroxine  75 mcg Oral QAC breakfast  . metronidazole  500 mg Intravenous Q8H   Continuous Infusions: . sodium chloride 75 mL/hr at 08/03/13 0547    Principal Problem:   Weakness Active Problems:   Hypotension   Diarrhea    Time spent: >35 minutes     Esperanza Sheets  [facility] Pager [phone]. If 7PM-7AM, please contact night-coverage at [email or web address], password [email or web address] 08/04/2013, 8:16 AM  LOS: 2 days

## 2013-08-04 NOTE — Progress Notes (Signed)
  CARE MANAGEMENT ED NOTE 08/04/2013  Patient:  Amanda Armstrong,Amanda P   Account Number:  [id number]  Date Initiated:  08/04/2013  Documentation initiated by:  Melinda CrutchWELBORN,KIM  Subjective/Objective Assessment:     Subjective/Objective Assessment Detail:   68 year old female who  has a past medical history of Hypothyroidism; Anxiety; Arthritis; Headache(784.0); Vertigo; and Multifactorial gait disorder.  Represented to the ED with chief complaint of weakness for past 2 days. Patient denies any complaints at this time she denies chest pain no shortness of breath has mild headache no nausea or vomiting but she does admit to having diarrhea for over a month which stopped 2 days ago after she was prescribed Lomotil by her PCP.  Patient admitted inpatient.     Action/Plan:   Action/Plan Detail:   Anticipated DC Date:  08/07/2013     Status Recommendation to Physician:   Result of Recommendation:      DC Planning Services  CM consult    Choice offered to / List presented to:            Status of service:  Completed, signed off  ED Comments:   ED Comments Detail:

## 2013-08-05 DIAGNOSIS — F411 Generalized anxiety disorder: Secondary | ICD-10-CM

## 2013-08-05 LAB — BASIC METABOLIC PANEL
BUN: 3 mg/dL — ABNORMAL LOW (ref 6–23)
CHLORIDE: 108 meq/L (ref 96–112)
CO2: 22 mEq/L (ref 19–32)
CREATININE: 0.62 mg/dL (ref 0.50–1.10)
Calcium: 8.6 mg/dL (ref 8.4–10.5)
GFR calc Af Amer: >90 mL/min (ref >90)
GFR calc non Af Amer: 90 mL/min — ABNORMAL LOW (ref >90)
Glucose, Bld: 102 mg/dL — ABNORMAL HIGH (ref 70–99)
POTASSIUM: 3.7 meq/L (ref 3.7–5.3)
Sodium: 143 mEq/L (ref 137–147)

## 2013-08-05 MED ORDER — ACETAMINOPHEN 325 MG PO TABS: 650.0000 mg | ORAL_TABLET | Freq: Four times a day (QID) | ORAL | Status: DC | PRN

## 2013-08-05 MED ORDER — VANCOMYCIN 50 MG/ML ORAL SOLUTION
125.0000 mg | Freq: Four times a day (QID) | ORAL | Status: DC
  Administered 2013-08-05 – 2013-08-06 (×4): 125 mg via ORAL
  Filled 2013-08-05 (×9): qty 2.5

## 2013-08-05 NOTE — Evaluation (Signed)
Physical Therapy Evaluation Patient Details Name: Amanda SailorsRebekah P Armstrong MRN: [id number] DOB: 1946/01/16 Today's Date: 08/05/2013 Time: [id number] PT Time Calculation (min): 29 min  PT Assessment / Plan / Recommendation History of Present Illness  Pt is admitted due to severe diarrhea, testing positive for C diff.  She had been in SNF for 3 weeks several weeks ago and feels that she may have contracted this illness there.  She now c/o severe weakness.  Pt was seen by me during a hospitalization in November 2014 and had pyelonephritis and weakness at that time.  Clinical Impression   Pt was seen for evaluation and found to have general deconditioning due to recent illness.  Her standing balance is compromised when she has no hand held assist.  She should be able to transfer to home at d/c with HHPT.  Pt and husband are in agreement.    PT Assessment  Patient needs continued PT services    Follow Up Recommendations  Home health PT    Does the patient have the potential to tolerate intense rehabilitation      Barriers to Discharge        Equipment Recommendations  None recommended by PT    Recommendations for Other Services     Frequency Min 3X/week    Precautions / Restrictions Precautions Precautions: Fall Precaution Comments: C diff precautions Restrictions Weight Bearing Restrictions: No   Pertinent Vitals/Pain       Mobility  Bed Mobility Overal bed mobility: Modified Independent Transfers Overall transfer level: Modified independent Equipment used: None General transfer comment: pt did have mild standing instability when standing up from [facility] and pulling up underpants Ambulation/Gait Ambulation/Gait assistance: Supervision Ambulation Distance (Feet): 100 Feet Assistive device: Rolling walker (2 wheeled) Gait velocity: WNL    Exercises General Exercises - Lower Extremity Ankle Circles/Pumps: AROM;Both;10 reps;Supine Short Arc Quad: AROM;Both;10 reps;Supine Heel  Slides: AROM;Both;10 reps;Supine Hip ABduction/ADduction: AROM;Both;10 reps;Supine   PT Diagnosis: Difficulty walking;Generalized weakness  PT Problem List: Decreased strength;Decreased activity tolerance;Decreased balance PT Treatment Interventions: Gait training;Functional mobility training;Therapeutic exercise     PT Goals(Current goals can be found in the care plan section) Acute Rehab PT Goals Patient Stated Goal: none stated PT Goal Formulation: With patient/family Time For Goal Achievement: 08/19/13 Potential to Achieve Goals: Good  Visit Information  Last PT Received On: 08/05/13 History of Present Illness: Pt is admitted due to severe diarrhea, testing positive for C diff.  She had been in SNF for 3 weeks several weeks ago and feels that she may have contracted this illness there.  She now c/o severe weakness.  Pt was seen by me during a hospitalization in November 2014 and had pyelonephritis and weakness at that time.       Prior Functioning  Home Living Family/patient expects to be discharged to:: Private residence Living Arrangements: Spouse/significant other Available Help at Discharge: Family Type of Home: House Home Access: Stairs to enter Secretary/administratorntrance Stairs-Number of Steps: 1 Entrance Stairs-Rails: None Home Layout: One level Home Equipment: Environmental consultantWalker - 2 wheels;Cane - single point;Shower seat Prior Function Level of Independence: Independent with assistive device(s) Comments: ambulated with walker most of the time Communication Communication: No difficulties    Cognition  Cognition Arousal/Alertness: Awake/alert Behavior During Therapy: WFL for tasks assessed/performed Overall Cognitive Status: Within Functional Limits for tasks assessed    Extremity/Trunk Assessment Lower Extremity Assessment Lower Extremity Assessment: Generalized weakness   Balance Balance Overall balance assessment: Needs assistance Sitting-balance support: Feet unsupported;No upper  extremity supported Sitting  balance-Leahy Scale: Normal Standing balance support: No upper extremity supported;During functional activity Standing balance-Leahy Scale: Fair  End of Session PT - End of Session Equipment Utilized During Treatment: Gait belt Activity Tolerance: Patient tolerated treatment well Patient left: in bed;with call bell/phone within reach;with chair alarm set;with family/visitor present Nurse Communication: Mobility status  GP     Konrad Penta 08/05/2013, 11:27 AM

## 2013-08-05 NOTE — Care Management Note (Addendum)
    Page 1 of 2   08/06/2013     10:33:55 AM   CARE MANAGEMENT NOTE 08/06/2013  [facility]  [facility] P   Account Number:  [id number]  Date Initiated:  08/05/2013  Documentation initiated by:  Sharrie RothmanBLACKWELL,Zyon Grout C  Subjective/Objective Assessment:   Pt admitted from home with dehydration and diarrhea. Pt lives with her husband and will return home at discharge. Pt has a cane and walker for home use but does not have to use it.     Action/Plan:   PT is recommending HH PT at discharge. [facility] aware of [facility]. WIll continue to follow for discharge planning needs.   Anticipated DC Date:  08/08/2013   Anticipated DC Plan:  HOME W HOME HEALTH SERVICES      DC Planning Services  CM consult      [facility]   Choice offered to / List presented to:  C-1 Patient        HH arranged  HH-2 PT      HH agency  [facility]   Status of service:  Completed, signed off Medicare Important Message given?  YES (If response is "NO", the following Medicare IM given date fields will be blank) Date Medicare IM given:  08/06/2013 Date Additional Medicare IM given:    Discharge Disposition:  HOME W HOME HEALTH SERVICES  Per UR Regulation:    If discussed at Long Length of Stay Meetings, dates discussed:    Comments:  08/06/13 1030 Arlyss Queenammy Gerrett Loman, RN BSN CM Pt discharged home today with [facility]. Lorella NimrodKristin Hollowell [facility] is aware and will collec the pts information from the chart. HH services to start within 48 hours of discharge. Pt and pts nurse aware of discharge arrangements.  08/05/13 1530 Arlyss Queenammy Yair Dusza, RN BSN CM

## 2013-08-05 NOTE — Progress Notes (Signed)
Amanda Armstrong PROGRESS NOTE  Amanda Armstrong ZOX:[id number] DOB: 1946-03-02 DOA: 08/02/2013 PCP: Amanda Ruths, MD  Assessment/Plan: 68 year old female who has a past medical history of Hypothyroidism; Anxiety; Arthritis; Headache(784.0); Vertigo; and Multifactorial gait disorder presented to the ED with chief complaint of weakness, diarrhea for over a month  Principal Problem:  Weakness  Active Problems:  Hypotension  Diarrhea   1. Weakness- likely due to dehydration secondary to long-standing diarrhea. Influenza neg -improving on IV fluids  2. Hypokalemia -replace potassium and check BMP in the morning   3. Abdominal pain with diarrhea/distention; KUB: Unremarkable bowel gas pattern; no free intra-abdominal air seen; exam no s/s of acute abdomen + C. difficile PCR; on Flagyl 500 IV every 8 hours. -afebrile, but still diarrhea; add PO Vanc   4. Hypothyroidism- continue with Synthroid  Code Status: full Family Communication: d/w patient; updated Amanda, Armstrong [phone]  (indicate person spoken with, relationship, and if by phone, the number) Disposition Plan: pend clinical improvement    Consultants:  None   Procedures:  None   Antibiotics:   Flagyl 1/17<<<   (indicate start date, and stop date if known)  HPI/Subjective: alert  Objective: Filed Vitals:   08/05/13 0538  BP: 118/72  Pulse: 86  Temp: 97.8 F (36.6 C)  Resp: 20    Intake/Output Summary (Last 24 hours) at 08/05/13 0910 Last data filed at 08/05/13 0500  Gross per 24 hour  Intake   1140 ml  Output     12 ml  Net   1128 ml   Filed Weights   08/02/13 1605 08/02/13 2230  Weight: 68.04 kg (150 lb) 70.171 kg (154 lb 11.2 oz)    Exam:   General:  alert  Cardiovascular: s1,s2 rrr  Respiratory: CTA BL  Abdomen: soft, nt, distended   Musculoskeletal: no edema   Data Reviewed: Basic Metabolic Panel:  Recent Labs Lab 08/02/13 1646 08/03/13 0601 08/04/13 0546  08/05/13 0501  NA 142 142 144 143  K 3.4* 4.0 3.2* 3.7  CL 103 106 110 108  CO2 26 23 23 22   GLUCOSE 125* 90 91 102*  BUN 9 6 4* 3*  CREATININE 0.67 0.63 0.58 0.62  CALCIUM 8.3* 8.3* 7.7* 8.6   Liver Function Tests:  Recent Labs Lab 08/03/13 0601  AST 18  ALT 15  ALKPHOS 94  BILITOT 0.7  PROT 6.9  ALBUMIN 3.4*   No results found for this basename: LIPASE, AMYLASE,  in the last 168 hours No results found for this basename: AMMONIA,  in the last 168 hours CBC:  Recent Labs Lab 08/02/13 1646 08/03/13 0601 08/04/13 0546  WBC 18.6* 19.3* 11.3*  NEUTROABS 15.8*  --   --   HGB 12.3 12.2 11.4*  HCT 36.0 37.8 33.8*  MCV 88.0 89.4 88.3  PLT 271 306 263   Cardiac Enzymes:  Recent Labs Lab 08/02/13 1646  TROPONINI <0.30   BNP (last 3 results)  Recent Labs  05/28/13 0545  PROBNP 1179.0*   CBG: No results found for this basename: GLUCAP,  in the last 168 hours  Recent Results (from the past 240 hour(s))  CLOSTRIDIUM DIFFICILE BY PCR     Status: Abnormal   Collection Time    08/03/13  8:33 AM      Result Value Range Status   C difficile by pcr POSITIVE (*) NEGATIVE Final   Comment: RESULT CALLED TO, READ BACK BY AND VERIFIED WITH:     T. WATKINS AT 1605 ON  08/03/2013 BY S.VANHOORNE     Studies: No results found.  Scheduled Meds: . ALPRAZolam  0.5 mg Oral Once  . enoxaparin (LOVENOX) injection  40 mg Subcutaneous Q24H  . levothyroxine  75 mcg Oral QAC breakfast  . metronidazole  500 mg Intravenous Q8H   Continuous Infusions: . 0.9 % NaCl with KCl 20 mEq / L 50 mL/hr at 08/05/13 0547    Principal Problem:   Weakness Active Problems:   Hypotension   Diarrhea    Time spent: >35 minutes     Amanda Armstrong, Amanda Armstrong  Amanda Armstrong Pager [phone]. If 7PM-7AM, please contact night-coverage at [email or web address], password [email or web address] 08/05/2013, 9:10 AM  LOS: 3 days

## 2013-08-06 MED ORDER — ALPRAZOLAM 0.5 MG PO TABS: 0.5000 mg | ORAL_TABLET | Freq: Two times a day (BID) | ORAL | Status: DC | PRN

## 2013-08-06 MED ORDER — METRONIDAZOLE 500 MG PO TABS: 500.0000 mg | ORAL_TABLET | Freq: Three times a day (TID) | ORAL | Status: DC

## 2013-08-06 NOTE — Progress Notes (Signed)
Discharge instructions and prescriptions given, verbalized understanding, out in stable condition via w/c with staff. 

## 2013-08-06 NOTE — Progress Notes (Signed)
OT Cancellation Note  Patient Details Name: Amanda SailorsRebekah P [place] MRN: [id number] DOB: [birth year]   Cancelled Treatment:    Reason Eval/Treat Not Completed: OT screened, no needs identified, will sign off (patient states she has had OT services before and does not feel as though she needs assistance at this time.  Demos/reports ability to perform ADLs  at baseline. )  Velora MediateHeather Herberth Deharo, OTR/L 08/06/2013, 9:21 AM

## 2013-08-06 NOTE — Discharge Summary (Signed)
Physician Discharge Summary  Amanda Armstrong ZOX:[id number] DOB: 08-26-1945 DOA: 08/02/2013  PCP: [name], MD  Admit date: 08/02/2013 Discharge date: 08/06/2013  Time spent: 35 minutes  Recommendations for Outpatient Follow-up:  HHC F/u with PCP in 1 week Discharge Diagnoses:  Principal Problem:   Weakness Active Problems:   Hypotension   Diarrhea   Discharge Condition: stable  Diet recommendation: heart healthy   Filed Weights   08/02/13 1605 08/02/13 2230  Weight: 68.04 kg (150 lb) 70.171 kg (154 lb 11.2 oz)    History of present illness:  68 year old female who has a past medical history of Hypothyroidism; Anxiety; Arthritis; Headache(784.0); Vertigo; and Multifactorial gait disorder presented to the ED with chief complaint of weakness, diarrhea for over a month  Principal Problem:  Weakness  Active Problems:  Hypotension  Diarrhea   Hospital Course:  1. Weakness- likely due to dehydration secondary to long-standing diarrhea. Influenza neg  -improved on IVF, supportive care; HHC upon d/c  2. Hypokalemia; replaced  3. Abdominal pain with diarrhea/distention; KUB: Unremarkable bowel gas pattern; no free intra-abdominal air seen; exam no s/s of acute abdomen  + C. difficile PCR; diarrhea improved on atx; changed tyo PO flagyl; outpatient follow up;  4. Hypothyroidism- continue with Synthroid    Procedures:  None  (i.e. Studies not automatically included, echos, thoracentesis, etc; not x-rays)  Consultations:  None   Discharge Exam: Filed Vitals:   08/06/13 0418  BP: 111/60  Pulse: 97  Temp: 97.9 F (36.6 C)  Resp: 20    General: alert Cardiovascular: s1,s2 rrr Respiratory: CTA BL  Discharge Instructions  Discharge Orders   Future Orders Complete By Expires   Diet - low sodium heart healthy  As directed    Discharge instructions  As directed    Comments:     Please follow up with primary care doctor in 1 week as needed   Increase  activity slowly  As directed        Medication List    STOP taking these medications       diphenoxylate-atropine 2.5-0.025 MG per tablet  Commonly known as:  LOMOTIL      TAKE these medications       alendronate 70 MG tablet  Commonly known as:  FOSAMAX  Take 70 mg by mouth every 7 (seven) days.     ALPRAZolam 0.5 MG tablet  Commonly known as:  XANAX  Take 1 tablet (0.5 mg total) by mouth 2 (two) times daily as needed for anxiety.     levothyroxine 75 MCG tablet  Commonly known as:  SYNTHROID, LEVOTHROID  Take 75 mcg by mouth daily.     metroNIDAZOLE 500 MG tablet  Commonly known as:  FLAGYL  Take 1 tablet (500 mg total) by mouth 3 (three) times daily.     tiotropium 18 MCG inhalation capsule  Commonly known as:  SPIRIVA  Place 18 mcg into inhaler and inhale daily.       Allergies  Allergen Reactions  . Oxycodone Other (See Comments)    "CX HER TO FELL LIKE SHE IS OUT OF HER BODY"       Follow-up Information   Follow up with [name], MD In 1 week.   Specialty:  Family Medicine   Contact information:   [name] [place] [address] [phone]        The results of significant diagnostics from this hospitalization (including imaging, microbiology, ancillary and laboratory) are  listed below for reference.    Significant Diagnostic Studies: Dg Chest 2 View  08/02/2013   CLINICAL DATA:  Weakness.  Fever.  EXAM: CHEST  2 VIEW  COMPARISON:  Chest x-ray 06/09/2013.  FINDINGS: Lung volumes are normal. No consolidative airspace disease. No pleural effusions. No pneumothorax. No pulmonary nodule or mass noted. Pulmonary vasculature and the cardiomediastinal silhouette are within normal limits.  IMPRESSION: 1.  No radiographic evidence of acute cardiopulmonary disease.   Electronically Signed   By: Trudie Reedaniel  Entrikin M.D.   On: 08/02/2013 17:17   Dg Abd 2 Views  08/03/2013   CLINICAL DATA:  Abdominal pain, distention, nausea  and shortness of breath.  EXAM: ABDOMEN - 2 VIEW  COMPARISON:  CT of the abdomen and pelvis performed 05/28/2013  FINDINGS: The visualized bowel gas pattern is unremarkable. Scattered air filled loops of colon are seen; no abnormal dilatation of small bowel loops is seen to suggest small bowel obstruction. No free intra-abdominal air is identified, though evaluation for free air is limited on a single supine view.  The visualized osseous structures are within normal limits; the sacroiliac joints are unremarkable in appearance. The visualized portions of the lungs are essentially clear.  IMPRESSION: Unremarkable bowel gas pattern; no free intra-abdominal air seen.   Electronically Signed   By: Roanna RaiderJeffery  Chang M.D.   On: 08/03/2013 02:09    Microbiology: Recent Results (from the past 240 hour(s))  CLOSTRIDIUM DIFFICILE BY PCR     Status: Abnormal   Collection Time    08/03/13  8:33 AM      Result Value Range Status   C difficile by pcr POSITIVE (*) NEGATIVE Final   Comment: RESULT CALLED TO, READ BACK BY AND VERIFIED WITH:     T. WATKINS AT 1605 ON 08/03/2013 BY S.VANHOORNE     Labs: Basic Metabolic Panel:  Recent Labs Lab 08/02/13 1646 08/03/13 0601 08/04/13 0546 08/05/13 0501  NA 142 142 144 143  K 3.4* 4.0 3.2* 3.7  CL 103 106 110 108  CO2 26 23 23 22   GLUCOSE 125* 90 91 102*  BUN 9 6 4* 3*  CREATININE 0.67 0.63 0.58 0.62  CALCIUM 8.3* 8.3* 7.7* 8.6   Liver Function Tests:  Recent Labs Lab 08/03/13 0601  AST 18  ALT 15  ALKPHOS 94  BILITOT 0.7  PROT 6.9  ALBUMIN 3.4*   No results found for this basename: LIPASE, AMYLASE,  in the last 168 hours No results found for this basename: AMMONIA,  in the last 168 hours CBC:  Recent Labs Lab 08/02/13 1646 08/03/13 0601 08/04/13 0546  WBC 18.6* 19.3* 11.3*  NEUTROABS 15.8*  --   --   HGB 12.3 12.2 11.4*  HCT 36.0 37.8 33.8*  MCV 88.0 89.4 88.3  PLT 271 306 263   Cardiac Enzymes:  Recent Labs Lab 08/02/13 1646   TROPONINI <0.30   BNP: BNP (last 3 results)  Recent Labs  05/28/13 0545  PROBNP 1179.0*   CBG: No results found for this basename: GLUCAP,  in the last 168 hours     Signed:  Jonette MateBURIEV, Aariya Ferrick N  [facility] 08/06/2013, 9:11 AM

## 2013-08-06 NOTE — Progress Notes (Signed)
Physical Therapy Treatment Patient Details Name: Amanda SailorsRebekah P Armstrong MRN: [id number] DOB: 1945-10-26 Today's Date: 08/06/2013 Time: 4098-11910842-0910 PT Time Calculation (min): 28 min  PT Assessment / Plan / Recommendation  History of Present Illness Pt reports feeling much better today.   PT Comments   Pt was seen for strengthening/balance exercise at bedside, closed chain.  She continues to have decreased standing balance and needs a walker for gait.  She continues to need PT at home for continuation of this program.  Follow Up Recommendations        Does the patient have the potential to tolerate intense rehabilitation     Barriers to Discharge        Equipment Recommendations       Recommendations for Other Services    Frequency     Progress towards PT Goals Progress towards PT goals: Progressing toward goals  Plan Current plan remains appropriate    Precautions / Restrictions Restrictions Weight Bearing Restrictions: No   Pertinent Vitals/Pain     Mobility  Bed Mobility Overal bed mobility: Modified Independent Transfers Overall transfer level: Modified independent General transfer comment: some standing instability continues while pt is standing and performing ADLs with no hand held assist Ambulation/Gait Ambulation/Gait assistance: Supervision Ambulation Distance (Feet): 125 Feet Assistive device: Rolling walker (2 wheeled) Gait velocity: WNL General Gait Details: pt tends to have decreased hip/knee flexion during swing through phase of gait...she was instructed in safe turn with walker    Exercises General Exercises - Lower Extremity Toe Raises: AROM;Both;10 reps;Standing Other Exercises Other Exercises: single leg stance with hand held support x 5 repetitions right and left Other Exercises: sit to stand x 5 repetitions with no hand held assist   PT Diagnosis:    PT Problem List:   PT Treatment Interventions:     PT Goals (current goals can now be found in the  care plan section)    Visit Information  Last PT Received On: 08/06/13 History of Present Illness: Pt reports feeling much better today.    Subjective Data      Cognition  Cognition Arousal/Alertness: Awake/alert Behavior During Therapy: WFL for tasks assessed/performed Overall Cognitive Status: Within Functional Limits for tasks assessed    Balance  Balance Standing balance support: No upper extremity supported Standing balance-Leahy Scale: Fair  End of Session PT - End of Session Equipment Utilized During Treatment: Gait belt Activity Tolerance: Patient tolerated treatment well Patient left: in chair;with call bell/phone within reach;with chair alarm set   GP     Myrlene BrokerBrown, Celica Kotowski L 08/06/2013, 9:16 AM

## 2013-08-07 NOTE — Progress Notes (Signed)
UR chart review completed.  

## 2013-08-27 ENCOUNTER — Encounter (HOSPITAL_COMMUNITY): Payer: Self-pay | Admitting: Emergency Medicine

## 2013-08-27 ENCOUNTER — Inpatient Hospital Stay (HOSPITAL_COMMUNITY)
Admission: EM | Admit: 2013-08-27 | Discharge: 2013-09-09 | DRG: 871 | Disposition: A | Discharge status: Skilled Nursing Facility | Payer: Medicare HMO | Attending: Internal Medicine | Admitting: Internal Medicine

## 2013-08-27 ENCOUNTER — Emergency Department (HOSPITAL_COMMUNITY): Payer: Medicare HMO

## 2013-08-27 DIAGNOSIS — A419 Sepsis, unspecified organism: Principal | ICD-10-CM

## 2013-08-27 DIAGNOSIS — A0472 Enterocolitis due to Clostridium difficile, not specified as recurrent: Secondary | ICD-10-CM | POA: Diagnosis present

## 2013-08-27 DIAGNOSIS — R5383 Other fatigue: Secondary | ICD-10-CM

## 2013-08-27 DIAGNOSIS — M545 Low back pain, unspecified: Secondary | ICD-10-CM | POA: Diagnosis present

## 2013-08-27 DIAGNOSIS — R0600 Dyspnea, unspecified: Secondary | ICD-10-CM

## 2013-08-27 DIAGNOSIS — R531 Weakness: Secondary | ICD-10-CM | POA: Diagnosis present

## 2013-08-27 DIAGNOSIS — R571 Hypovolemic shock: Secondary | ICD-10-CM | POA: Diagnosis present

## 2013-08-27 DIAGNOSIS — R197 Diarrhea, unspecified: Secondary | ICD-10-CM

## 2013-08-27 DIAGNOSIS — F411 Generalized anxiety disorder: Secondary | ICD-10-CM

## 2013-08-27 DIAGNOSIS — Y92009 Unspecified place in unspecified non-institutional (private) residence as the place of occurrence of the external cause: Secondary | ICD-10-CM

## 2013-08-27 DIAGNOSIS — W19XXXA Unspecified fall, initial encounter: Secondary | ICD-10-CM | POA: Diagnosis present

## 2013-08-27 DIAGNOSIS — E039 Hypothyroidism, unspecified: Secondary | ICD-10-CM | POA: Diagnosis present

## 2013-08-27 DIAGNOSIS — M25551 Pain in right hip: Secondary | ICD-10-CM

## 2013-08-27 DIAGNOSIS — E860 Dehydration: Secondary | ICD-10-CM

## 2013-08-27 DIAGNOSIS — R062 Wheezing: Secondary | ICD-10-CM | POA: Diagnosis present

## 2013-08-27 DIAGNOSIS — M1990 Unspecified osteoarthritis, unspecified site: Secondary | ICD-10-CM | POA: Diagnosis present

## 2013-08-27 DIAGNOSIS — Z96659 Presence of unspecified artificial knee joint: Secondary | ICD-10-CM

## 2013-08-27 DIAGNOSIS — I959 Hypotension, unspecified: Secondary | ICD-10-CM

## 2013-08-27 DIAGNOSIS — R578 Other shock: Secondary | ICD-10-CM | POA: Diagnosis present

## 2013-08-27 DIAGNOSIS — R5381 Other malaise: Secondary | ICD-10-CM

## 2013-08-27 DIAGNOSIS — R0609 Other forms of dyspnea: Secondary | ICD-10-CM

## 2013-08-27 DIAGNOSIS — E876 Hypokalemia: Secondary | ICD-10-CM | POA: Diagnosis present

## 2013-08-27 DIAGNOSIS — R000 Tachycardia, unspecified: Secondary | ICD-10-CM | POA: Diagnosis present

## 2013-08-27 DIAGNOSIS — R6510 Systemic inflammatory response syndrome (SIRS) of non-infectious origin without acute organ dysfunction: Secondary | ICD-10-CM | POA: Diagnosis present

## 2013-08-27 DIAGNOSIS — Z9181 History of falling: Secondary | ICD-10-CM

## 2013-08-27 DIAGNOSIS — Z8249 Family history of ischemic heart disease and other diseases of the circulatory system: Secondary | ICD-10-CM

## 2013-08-27 DIAGNOSIS — M1710 Unilateral primary osteoarthritis, unspecified knee: Secondary | ICD-10-CM | POA: Diagnosis present

## 2013-08-27 DIAGNOSIS — E861 Hypovolemia: Secondary | ICD-10-CM | POA: Diagnosis present

## 2013-08-27 DIAGNOSIS — Z87891 Personal history of nicotine dependence: Secondary | ICD-10-CM

## 2013-08-27 DIAGNOSIS — R0989 Other specified symptoms and signs involving the circulatory and respiratory systems: Secondary | ICD-10-CM

## 2013-08-27 DIAGNOSIS — F419 Anxiety disorder, unspecified: Secondary | ICD-10-CM

## 2013-08-27 HISTORY — DX: Carrier of other intestinal infectious diseases: Z22.1

## 2013-08-27 LAB — CBC WITH DIFFERENTIAL/PLATELET
BASOS ABS: 0 10*3/uL (ref 0.0–0.1)
Basophils Relative: 0 % (ref 0–1)
Eosinophils Absolute: 0 10*3/uL (ref 0.0–0.7)
Eosinophils Relative: 0 % (ref 0–5)
HEMATOCRIT: 41.8 % (ref 36.0–46.0)
HEMOGLOBIN: 13.5 g/dL (ref 12.0–15.0)
Lymphocytes Relative: 13 % (ref 12–46)
Lymphs Abs: 1.3 10*3/uL (ref 0.7–4.0)
MCH: 29.2 pg (ref 26.0–34.0)
MCHC: 32.3 g/dL (ref 30.0–36.0)
MCV: 90.5 fL (ref 78.0–100.0)
MONO ABS: 1.2 10*3/uL — AB (ref 0.1–1.0)
MONOS PCT: 11 % (ref 3–12)
NEUTROS ABS: 8 10*3/uL — AB (ref 1.7–7.7)
Neutrophils Relative %: 76 % (ref 43–77)
Platelets: 254 10*3/uL (ref 150–400)
RBC: 4.62 MIL/uL (ref 3.87–5.11)
RDW: 16.2 % — ABNORMAL HIGH (ref 11.5–15.5)
WBC: 10.5 10*3/uL (ref 4.0–10.5)

## 2013-08-27 LAB — BLOOD GAS, ARTERIAL
ACID-BASE DEFICIT: 3.7 mmol/L — AB (ref 0.0–2.0)
BICARBONATE: 20.7 meq/L (ref 20.0–24.0)
Drawn by: 295031
O2 Content: 2 L/min
O2 Saturation: 96.6 %
PO2 ART: 87 mmHg (ref 80.0–100.0)
Patient temperature: 98.6
TCO2: 19.3 mmol/L (ref 0–100)
pCO2 arterial: 37.2 mmHg (ref 35.0–45.0)
pH, Arterial: 7.364 (ref 7.350–7.450)

## 2013-08-27 LAB — COMPREHENSIVE METABOLIC PANEL
ALBUMIN: 4.1 g/dL (ref 3.5–5.2)
ALT: 14 U/L (ref 0–35)
AST: 15 U/L (ref 0–37)
Alkaline Phosphatase: 60 U/L (ref 39–117)
BILIRUBIN TOTAL: 0.9 mg/dL (ref 0.3–1.2)
BUN: 12 mg/dL (ref 6–23)
CHLORIDE: 98 meq/L (ref 96–112)
CO2: 21 meq/L (ref 19–32)
Calcium: 8.6 mg/dL (ref 8.4–10.5)
Creatinine, Ser: 0.78 mg/dL (ref 0.50–1.10)
GFR calc Af Amer: >90 mL/min (ref >90)
GFR calc non Af Amer: 83 mL/min — ABNORMAL LOW (ref >90)
Glucose, Bld: 137 mg/dL — ABNORMAL HIGH (ref 70–99)
Potassium: 3.5 mEq/L — ABNORMAL LOW (ref 3.7–5.3)
Sodium: 138 mEq/L (ref 137–147)
Total Protein: 7.5 g/dL (ref 6.0–8.3)

## 2013-08-27 LAB — URINALYSIS, ROUTINE W REFLEX MICROSCOPIC
Glucose, UA: NEGATIVE mg/dL
HGB URINE DIPSTICK: NEGATIVE
Ketones, ur: 15 mg/dL — AB
NITRITE: NEGATIVE
Protein, ur: NEGATIVE mg/dL
Specific Gravity, Urine: 1.022 (ref 1.005–1.030)
UROBILINOGEN UA: 0.2 mg/dL (ref 0.0–1.0)
pH: 6 (ref 5.0–8.0)

## 2013-08-27 LAB — PRO B NATRIURETIC PEPTIDE: Pro B Natriuretic peptide (BNP): 208.9 pg/mL — ABNORMAL HIGH (ref 0–125)

## 2013-08-27 LAB — LACTATE DEHYDROGENASE: LDH: 358 U/L — AB (ref 94–250)

## 2013-08-27 LAB — URINE MICROSCOPIC-ADD ON

## 2013-08-27 LAB — TROPONIN I: Troponin I: <0.3 ng/mL (ref <0.30)

## 2013-08-27 LAB — MRSA PCR SCREENING: MRSA BY PCR: NEGATIVE

## 2013-08-27 LAB — CG4 I-STAT (LACTIC ACID): Lactic Acid, Venous: 4.94 mmol/L — ABNORMAL HIGH (ref 0.5–2.2)

## 2013-08-27 LAB — INFLUENZA PANEL BY PCR (TYPE A & B)
H1N1 flu by pcr: NOT DETECTED
Influenza A By PCR: NEGATIVE
Influenza B By PCR: NEGATIVE

## 2013-08-27 LAB — TSH: TSH: 0.696 u[IU]/mL (ref 0.350–4.500)

## 2013-08-27 LAB — LACTIC ACID, PLASMA: LACTIC ACID, VENOUS: 1.3 mmol/L (ref 0.5–2.2)

## 2013-08-27 MED ORDER — SODIUM CHLORIDE 0.9 % IV SOLN
INTRAVENOUS | Status: DC
  Administered 2013-08-27 (×2): via INTRAVENOUS

## 2013-08-27 MED ORDER — SODIUM CHLORIDE 0.9 % IV BOLUS (SEPSIS): 1000.0000 mL | Freq: Once | INTRAVENOUS | Status: DC

## 2013-08-27 MED ORDER — HEPARIN SODIUM (PORCINE) 5000 UNIT/ML IJ SOLN
5000.0000 [IU] | Freq: Three times a day (TID) | INTRAMUSCULAR | Status: DC
  Administered 2013-08-27 – 2013-09-09 (×40): 5000 [IU] via SUBCUTANEOUS
  Filled 2013-08-27 (×43): qty 1

## 2013-08-27 MED ORDER — ALPRAZOLAM 0.5 MG PO TABS
0.5000 mg | ORAL_TABLET | Freq: Two times a day (BID) | ORAL | Status: DC | PRN
  Administered 2013-08-28 – 2013-09-07 (×9): 0.5 mg via ORAL
  Filled 2013-08-27 (×13): qty 1

## 2013-08-27 MED ORDER — ONDANSETRON HCL 4 MG PO TABS: 4.0000 mg | ORAL_TABLET | Freq: Four times a day (QID) | ORAL | Status: DC | PRN

## 2013-08-27 MED ORDER — BIOTENE DRY MOUTH MT LIQD
15.0000 mL | Freq: Two times a day (BID) | OROMUCOSAL | Status: DC
  Administered 2013-08-28 – 2013-09-09 (×26): 15 mL via OROMUCOSAL

## 2013-08-27 MED ORDER — VANCOMYCIN HCL IN DEXTROSE 1-5 GM/200ML-% IV SOLN
1000.0000 mg | Freq: Once | INTRAVENOUS | Status: AC
  Administered 2013-08-27: 1000 mg via INTRAVENOUS
  Filled 2013-08-27: qty 200

## 2013-08-27 MED ORDER — IPRATROPIUM-ALBUTEROL 0.5-2.5 (3) MG/3ML IN SOLN
6.0000 mL | Freq: Once | RESPIRATORY_TRACT | Status: AC
Start: 2013-08-27 — End: 2013-08-27
  Administered 2013-08-27: 6 mL via RESPIRATORY_TRACT

## 2013-08-27 MED ORDER — ACETAMINOPHEN 500 MG PO TABS
1000.0000 mg | ORAL_TABLET | Freq: Once | ORAL | Status: AC
  Administered 2013-08-27: 1000 mg via ORAL
  Filled 2013-08-27: qty 2

## 2013-08-27 MED ORDER — ACETAMINOPHEN 325 MG PO TABS
650.0000 mg | ORAL_TABLET | Freq: Four times a day (QID) | ORAL | Status: DC | PRN
  Administered 2013-08-31: 650 mg via ORAL
  Filled 2013-08-27 (×2): qty 2

## 2013-08-27 MED ORDER — ONDANSETRON HCL 4 MG/2ML IJ SOLN: 4.0000 mg | Freq: Four times a day (QID) | INTRAMUSCULAR | Status: DC | PRN

## 2013-08-27 MED ORDER — ACETAMINOPHEN 650 MG RE SUPP: 650.0000 mg | Freq: Four times a day (QID) | RECTAL | Status: DC | PRN

## 2013-08-27 MED ORDER — PIPERACILLIN-TAZOBACTAM 3.375 G IVPB 30 MIN
3.3750 g | Freq: Once | INTRAVENOUS | Status: AC
Start: 2013-08-27 — End: 2013-08-27
  Administered 2013-08-27: 3.375 g via INTRAVENOUS
  Filled 2013-08-27: qty 50

## 2013-08-27 MED ORDER — VANCOMYCIN HCL 500 MG IV SOLR
500.0000 mg | Freq: Two times a day (BID) | INTRAVENOUS | Status: DC
  Filled 2013-08-27: qty 500

## 2013-08-27 MED ORDER — PIPERACILLIN-TAZOBACTAM 3.375 G IVPB
3.3750 g | Freq: Three times a day (TID) | INTRAVENOUS | Status: DC
  Administered 2013-08-27 – 2013-08-28 (×2): 3.375 g via INTRAVENOUS
  Filled 2013-08-27 (×4): qty 50

## 2013-08-27 MED ORDER — SODIUM CHLORIDE 0.9 % IV BOLUS (SEPSIS)
1000.0000 mL | Freq: Once | INTRAVENOUS | Status: AC
  Administered 2013-08-27: 1000 mL via INTRAVENOUS

## 2013-08-27 MED ORDER — METRONIDAZOLE IN NACL 5-0.79 MG/ML-% IV SOLN
500.0000 mg | Freq: Three times a day (TID) | INTRAVENOUS | Status: DC
  Administered 2013-08-27 – 2013-08-28 (×3): 500 mg via INTRAVENOUS
  Filled 2013-08-27 (×5): qty 100

## 2013-08-27 MED ORDER — SODIUM CHLORIDE 0.9 % IV SOLN
INTRAVENOUS | Status: DC
  Administered 2013-08-27 – 2013-08-29 (×2): via INTRAVENOUS
  Administered 2013-08-30: 1000 mL via INTRAVENOUS

## 2013-08-27 MED ORDER — SODIUM CHLORIDE 0.9 % IV SOLN
INTRAVENOUS | Status: AC
  Administered 2013-08-27 (×2): via INTRAVENOUS

## 2013-08-27 MED ORDER — IPRATROPIUM-ALBUTEROL 0.5-2.5 (3) MG/3ML IN SOLN
RESPIRATORY_TRACT | Status: AC
  Filled 2013-08-27: qty 3

## 2013-08-27 MED ORDER — LEVOTHYROXINE SODIUM 75 MCG PO TABS
75.0000 ug | ORAL_TABLET | Freq: Every day | ORAL | Status: DC
  Administered 2013-08-27 – 2013-09-09 (×14): 75 ug via ORAL
  Filled 2013-08-27 (×16): qty 1

## 2013-08-27 NOTE — ED Notes (Signed)
Family at bedside. Pt has increased hr, to 140. Increased respiratory rate. Remains alert, appropriate. O2 sat 95. Pt shivering. IV/labs postponed to transfer pt to Room [facility]. Transported via wheelchair, pt assisted to standing, unable to step or pivot.  Lifted into bed. O2 2l [place] applied. Hx of C-diff restated to staff. All verbal responses appropriate.

## 2013-08-27 NOTE — H&P (Signed)
[facility] History and Physical  Amanda SailorsRebekah P Micucci ZOX:[id number] DOB: May 20, 1946 DOA: 08/27/2013  Referring physician: Emergency Department PCP: Kirk RuthsMCGOUGH,WILLIAM M, MD  Specialists:   Chief Complaint: Weakness  HPI: Amanda Armstrong is a 68 y.o. female  With a hx of recent cdiff colitis, hypothyroid who presents to the ED with marked weakness, dehydration, continued diarrhea, and falls at home. Pt was recently discharged from rehab secondary to generalized weakness related to dehydration from long-standing diarrhea. In the ED, the patient was noted to be febrile with an elevated lactate of just under 5. The patient was noted to have a bout of respiratory distress, but later improved. CXR was unremarkable. Hospitalist consulted for admission.  Review of Systems:   Per above, the remainder of the 10pt ros reviewed and are neg  Past Medical History  Diagnosis Date  . Hypothyroidism   . Anxiety   . Arthritis     Bilateral Knee DJD  . Headache(784.0)     SINUS HEADACHE OCCASSIONALLY  . Vertigo   . Multifactorial gait disorder   . Clostridium difficile carrier     pt stated that she has intermittent sx   Past Surgical History  Procedure Laterality Date  . Joint replacement  11/08/3010    right total knee  . Tubal ligation  1982  . Total knee arthroplasty  11/07/2011    Procedure: TOTAL KNEE ARTHROPLASTY;  Surgeon: Nilda Simmerobert A Wainer, MD;  [facility];  Service: Orthopedics;  Laterality: Left;  DR Thurston HoleWAINER WANTS 90 MINUTES FOR THIS CASE   Social History:  reports that she quit smoking about 14 months ago. Her smoking use included Cigarettes. She has a 48 pack-year smoking history. She has never used smokeless tobacco. She reports that she does not drink alcohol or use illicit drugs.  where does patient live--home, ALF, SNF? and with whom if at home?  Can patient participate in ADLs?  Allergies  Allergen Reactions  . Oxycodone Other (See Comments)    "CX HER TO FELL LIKE SHE IS  OUT OF HER BODY"    Family History  Problem Relation Age of Onset  . Heart attack Mother   . COPD Father   . Arthritis Sister   . Heart disease Brother     (be sure to complete)  Prior to Admission medications   Medication Sig Start Date End Date Taking? Authorizing Provider  alendronate (FOSAMAX) 70 MG tablet Take 70 mg by mouth every 7 (seven) days.  04/05/13  Yes Historical Provider, MD  ALPRAZolam Prudy Feeler(XANAX) 0.5 MG tablet Take 1 tablet (0.5 mg total) by mouth 2 (two) times daily as needed for anxiety. 08/06/13  Yes Esperanza SheetsUlugbek N Buriev, MD  levothyroxine (SYNTHROID, LEVOTHROID) 75 MCG tablet Take 75 mcg by mouth daily.   Yes Historical Provider, MD   Physical Exam: Filed Vitals:   08/27/13 1125 08/27/13 1145 08/27/13 1219 08/27/13 1253  BP:  103/52 117/55   Pulse:      Temp: 103.1 F (39.5 C)     TempSrc: Rectal     Resp:      Weight:      SpO2:    100%     General:  Awake, in nad  Eyes: PERRL B  ENT: membranes moist, dentition fair  Neck: trachea midline, neck supple  Cardiovascular: tachycardic, s1, s2  Respiratory: normal resp effort, no wheezing  Abdomen: soft, nondistended  Skin: decreased skin turgor, no abnormal skin lesions seen  Musculoskeletal: perfused, no clubbing  Psychiatric: mood/affect normal //  no auditory/visual hallucinations  Neurologic: cn2-12 grossly intact, strength/sensation intact  Labs on Admission:  Basic Metabolic Panel:  Recent Labs Lab 08/27/13 1110  NA 138  K 3.5*  CL 98  CO2 21  GLUCOSE 137*  BUN 12  CREATININE 0.78  CALCIUM 8.6   Liver Function Tests:  Recent Labs Lab 08/27/13 1110  AST 15  ALT 14  ALKPHOS 60  BILITOT 0.9  PROT 7.5  ALBUMIN 4.1   No results found for this basename: LIPASE, AMYLASE,  in the last 168 hours No results found for this basename: AMMONIA,  in the last 168 hours CBC:  Recent Labs Lab 08/27/13 1110  WBC 10.5  NEUTROABS 8.0*  HGB 13.5  HCT 41.8  MCV 90.5  PLT 254   Cardiac  Enzymes:  Recent Labs Lab 08/27/13 1110  TROPONINI <0.30    BNP (last 3 results)  Recent Labs  05/28/13 0545 08/27/13 1110  PROBNP 1179.0* 208.9*   CBG: No results found for this basename: GLUCAP,  in the last 168 hours  Radiological Exams on Admission: Dg Chest 2 View  08/27/2013   CLINICAL DATA:  Shortness of breath.  COPD.  Pain.  EXAM: CHEST  2 VIEW  COMPARISON:  08/02/2013, 05/29/2013, 05/27/2013, and 06/09/2013  FINDINGS: Heart size and pulmonary vascularity are normal. Lungs are clear. No acute osseous abnormality.  IMPRESSION: No active cardiopulmonary disease.   Electronically Signed   By: Geanie Cooley M.D.   On: 08/27/2013 12:17   Dg Lumbar Spine Complete  08/27/2013   CLINICAL DATA:  Low back pain following injury  EXAM: LUMBAR SPINE - COMPLETE 4+ VIEW  COMPARISON:  None.  FINDINGS: Vertebral body height is well maintained. Disc space narrowing is noted from the L3-S1. Associated facet hypertrophic changes are seen. No acute abnormality is noted.  IMPRESSION: Degenerative change without acute abnormality.   Electronically Signed   By: Alcide Clever M.D.   On: 08/27/2013 12:17   Dg Hip Complete Right  08/27/2013   CLINICAL DATA:  Right hip pain following recent injury  EXAM: RIGHT HIP - COMPLETE 2+ VIEW  COMPARISON:  None.  FINDINGS: Pelvic ring is intact. No acute fracture or dislocation is noted. Irregular calcification is noted in the left hemipelvis likely related to a uterine fibroid.  IMPRESSION: No acute abnormality noted.   Electronically Signed   By: Alcide Clever M.D.   On: 08/27/2013 12:16    Assessment/Plan Principal Problem:   SIRS (systemic inflammatory response syndrome) Active Problems:   Arthritis   Hypothyroidism   Tachycardia   Weakness   1. SIRS 1. Unclear source 2. Pan cultures pending 3. No acute findings on cxr 4. Cont on broad vanc/zosyn with empiric IV flagyl given hx of cdifff 5. Cont on aggressive IVF hydration 6. Will repeat lactate  for interval change 7. Admit to stepdown 2. Hypothyroid 1. Cont home thyroid replacement 2. Check TSH 3. Weakness 1. Likely secondary to dehydration/acute illness 2. Consult PT/OT 4. DVT prophylaxis 1. Heparin subQ   Code Status: Full (must indicate code status--if unknown or must be presumed, indicate so) Family Communication: Pt in room (indicate person spoken with, if applicable, with phone number if by telephone) Disposition Plan: Pending (indicate anticipated LOS)  Time spent:  Tiaja Hagan K [facility] Pager [phone]  If 7PM-7AM, please contact night-coverage [email or web address] Password [email or web address] 08/27/2013, 1:45 PM

## 2013-08-27 NOTE — ED Notes (Signed)
Dr. Jodi MourningZavitz made aware of abnormal lactic acid. dt

## 2013-08-27 NOTE — Progress Notes (Signed)
Pt noted to be hypotensive shortly after transfer to floor. Cont aggressive volume resuscitation. Discussed case with Critical Care, who will follow patient.

## 2013-08-27 NOTE — ED Notes (Signed)
Patient transported to X-ray 

## 2013-08-27 NOTE — ED Notes (Signed)
PER EMS- Called to home due to c/o recurrent hip and back pain.Pt reported that she has fallen every day x 4 days. Denies LOC, denies striking head. Pt ambulated this am,returned to bed and called EMS due to pain. Pt stated that she called EMS due to persistent pain in r/hip and low back. Pt stated that she ambulated to bathroom without assist this am. Per EMS -pt was unable to get out of bed due to pain. Pt alert, oriented and appropriate. Denies loose stools in last few days

## 2013-08-27 NOTE — Progress Notes (Signed)
Two RTs atttempted x3 total to place Aline without success, unable to thread catheter.  RN notified.  Elink MD was made aware by RN.

## 2013-08-27 NOTE — ED Provider Notes (Signed)
CSN: [id number]     Arrival date & time 08/27/13  1003 History   First MD Initiated Contact with Patient 08/27/13 1106     Chief Complaint  Patient presents with  . Leg Pain    chronic  . Back Pain    chronic   . Fall    pt stated that she falls every day     (Consider location/radiation/quality/duration/timing/severity/associated sxs/prior Treatment) HPI Comments: 68 yo female with anxiety, hypothyroid, UTI, Diarrhea, recent rehab/ admission in Dec for dehydration/ diarrhea, C diff hx presents with dyspnea, back pain and fatigue.  Pt has had right hip and lower back pain for months however worse with fall and landing on walker this week.  Worse with movement.  Hx assisted by husband.  No fevers.  Mild sob the past two days, denies cough, no recent surgery but pt not as active.  No new leg swelling or blood clot hx.  Diarrhea has improved.  No bleeding. No new meds.  No known cardiac hx.  COPD hx, past smoker.   Patient is a 68 y.o. female presenting with leg pain, back pain, and fall. The history is provided by the patient and a relative.  Leg Pain Associated symptoms: back pain and fatigue   Associated symptoms: no fever and no neck pain   Back Pain Associated symptoms: leg pain   Associated symptoms: no abdominal pain, no chest pain, no dysuria, no fever and no headaches   Fall Associated symptoms include shortness of breath. Pertinent negatives include no chest pain, no abdominal pain and no headaches.    Past Medical History  Diagnosis Date  . Hypothyroidism   . Anxiety   . Arthritis     Bilateral Knee DJD  . Headache(784.0)     SINUS HEADACHE OCCASSIONALLY  . Vertigo   . Multifactorial gait disorder   . Clostridium difficile carrier     pt stated that she has intermittent sx   Past Surgical History  Procedure Laterality Date  . Joint replacement  11/08/3010    right total knee  . Tubal ligation  1982  . Total knee arthroplasty  11/07/2011    Procedure: TOTAL KNEE  ARTHROPLASTY;  Surgeon: Nilda Simmer, MD;  Location: [facility] OR;  Service: Orthopedics;  Laterality: Left;  DR Thurston Hole WANTS 90 MINUTES FOR THIS CASE   Family History  Problem Relation Age of Onset  . Heart attack Mother   . COPD Father   . Arthritis Sister   . Heart disease Brother    History  Substance Use Topics  . Smoking status: Former Smoker -- 1.00 packs/day for 48 years    Types: Cigarettes    Quit date: 06/15/2012  . Smokeless tobacco: Not on file  . Alcohol Use: No   OB History   Grav Para Term Preterm Abortions TAB SAB Ect Mult Living                 Review of Systems  Constitutional: Positive for appetite change and fatigue. Negative for fever and chills.  HENT: Negative for congestion.   Eyes: Negative for visual disturbance.  Respiratory: Positive for shortness of breath. Negative for cough.   Cardiovascular: Negative for chest pain and leg swelling.  Gastrointestinal: Positive for nausea and diarrhea. Negative for vomiting and abdominal pain.  Genitourinary: Negative for dysuria and flank pain.  Musculoskeletal: Positive for back pain. Negative for neck pain and neck stiffness.  Skin: Negative for rash.  Neurological: Negative for light-headedness and  headaches.      Allergies  Oxycodone  Home Medications   Current Outpatient Rx  Name  Route  Sig  Dispense  Refill  . alendronate (FOSAMAX) 70 MG tablet   Oral   Take 70 mg by mouth every 7 (seven) days.          . ALPRAZolam (XANAX) 0.5 MG tablet   Oral   Take 1 tablet (0.5 mg total) by mouth 2 (two) times daily as needed for anxiety.   20 tablet   0   . levothyroxine (SYNTHROID, LEVOTHROID) 75 MCG tablet   Oral   Take 75 mcg by mouth daily.          BP 114/58  Pulse 120  Temp(Src) 99.4 F (37.4 C) (Oral)  Resp 20  Wt 154 lb (69.854 kg)  SpO2 95% Physical Exam  Nursing note and vitals reviewed. Constitutional: She is oriented to person, place, and time. She appears well-developed. She  appears distressed.  HENT:  Head: Normocephalic and atraumatic.  Mild dry mm  Eyes: Conjunctivae are normal. Right eye exhibits no discharge. Left eye exhibits no discharge.  Neck: Normal range of motion. Neck supple. No tracheal deviation present.  Cardiovascular: Regular rhythm.  Tachycardia present.   Pulmonary/Chest: She is in respiratory distress. She has rales.  Few rales at bases, decreased air movement bilateral, tachypnea  Abdominal: Soft. She exhibits no distension. There is no tenderness. There is no guarding.  Musculoskeletal: She exhibits tenderness. She exhibits no edema.  No midline vertebral tenderness, paraspinal lumbar tender No pain with flexion of hips bilateral  Neurological: She is alert and oriented to person, place, and time. No cranial nerve deficit (mild sedate).  4/5 strength LE bilateral with F/ E Sensation intact bilateral LE with palpation  Skin: Skin is warm. No rash noted.  Psychiatric: She has a normal mood and affect.    ED Course  Procedures (including critical care time)   EMERGENCY DEPARTMENT [place] CARDIAC EXAM "Study: Limited Ultrasound of the heart and pericardium"  INDICATIONS:Tachycardia and Dyspnea Multiple views of the heart and pericardium were obtained in real-time with a multi-frequency probe.  PERFORMED ZO:XWRUEA  IMAGES ARCHIVED?: Yes  FINDINGS: No pericardial effusion, Hyperdynamic contractility and Normal contractility  LIMITATIONS:  Emergent procedure  VIEWS USED: Parasternal long axis, Parasternal short axis and Apical 4 chamber   INTERPRETATION: Cardiac activity present, Pericardial effusioin absent, Cardiac tamponade absent and Increased contractility  Emergency Ultrasound: Limited Thoracic Performed and interpreted by Dr Jodi Mourning Longitudinal view of anterior left and right lung fields in real-time with linear probe. Indication: dyspnea Findings: pos lung sliding mild B lines Interpretation: no evidence of  pneumothorax. Images electronically archived.      Labs Review Labs Reviewed  CBC WITH DIFFERENTIAL - Abnormal; Notable for the following:    RDW 16.2 (*)    Neutro Abs 8.0 (*)    Monocytes Absolute 1.2 (*)    All other components within normal limits  COMPREHENSIVE METABOLIC PANEL - Abnormal; Notable for the following:    Potassium 3.5 (*)    Glucose, Bld 137 (*)    GFR calc non Af Amer 83 (*)    All other components within normal limits  URINALYSIS, ROUTINE W REFLEX MICROSCOPIC - Abnormal; Notable for the following:    Color, Urine AMBER (*)    APPearance CLOUDY (*)    Bilirubin Urine SMALL (*)    Ketones, ur 15 (*)    Leukocytes, UA SMALL (*)    All  other components within normal limits  PRO B NATRIURETIC PEPTIDE - Abnormal; Notable for the following:    Pro B Natriuretic peptide (BNP) 208.9 (*)    All other components within normal limits  CG4 I-STAT (LACTIC ACID) - Abnormal; Notable for the following:    Lactic Acid, Venous 4.94 (*)    All other components within normal limits  CULTURE, BLOOD (ROUTINE X 2)  CULTURE, BLOOD (ROUTINE X 2)  URINE CULTURE  CLOSTRIDIUM DIFFICILE BY PCR  TROPONIN I  URINE MICROSCOPIC-ADD ON  INFLUENZA PANEL BY PCR (TYPE A & B, H1N1)   Imaging Review Dg Chest 2 View  08/27/2013   CLINICAL DATA:  Shortness of breath.  COPD.  Pain.  EXAM: CHEST  2 VIEW  COMPARISON:  08/02/2013, 05/29/2013, 05/27/2013, and 06/09/2013  FINDINGS: Heart size and pulmonary vascularity are normal. Lungs are clear. No acute osseous abnormality.  IMPRESSION: No active cardiopulmonary disease.   Electronically Signed   By: Geanie CooleyJim  Maxwell M.D.   On: 08/27/2013 12:17   Dg Lumbar Spine Complete  08/27/2013   CLINICAL DATA:  Low back pain following injury  EXAM: LUMBAR SPINE - COMPLETE 4+ VIEW  COMPARISON:  None.  FINDINGS: Vertebral body height is well maintained. Disc space narrowing is noted from the L3-S1. Associated facet hypertrophic changes are seen. No acute  abnormality is noted.  IMPRESSION: Degenerative change without acute abnormality.   Electronically Signed   By: Alcide CleverMark  Lukens M.D.   On: 08/27/2013 12:17   Dg Hip Complete Right  08/27/2013   CLINICAL DATA:  Right hip pain following recent injury  EXAM: RIGHT HIP - COMPLETE 2+ VIEW  COMPARISON:  None.  FINDINGS: Pelvic ring is intact. No acute fracture or dislocation is noted. Irregular calcification is noted in the left hemipelvis likely related to a uterine fibroid.  IMPRESSION: No acute abnormality noted.   Electronically Signed   By: Alcide CleverMark  Lukens M.D.   On: 08/27/2013 12:16    EKG Interpretation    Date/Time:  Tuesday August 27 2013 11:15:01 EST Ventricular Rate:  136 PR Interval:  148 QRS Duration: 77 QT Interval:  269 QTC Calculation: 404 R Axis:   7 Text Interpretation:  Sinus tachycardia Low voltage, extremity and precordial leads Nonspecific T abnormalities, lateral leads Confirmed by Shalondra Wunschel  MD, Aleksey Newbern ([id number]) on 08/27/2013 12:41:52 PM            MDM   Final diagnoses:  None   Called to resuscitation bay for distress. Presented in respiratory distress with hr 140s, appears sinus at bedside.  Fever, broad abx. Bedside US done of lungs and heart, no pericardial effusion, possible pneumonia.  Pt improved on multiple rechecks.  Pt on [place] O2.  No definite source of fever, recheck, denies abdo pain, does not want pain meds for hip.  Spoke with [facility], agreed with step down. Duoneb given.   The patients results and plan were reviewed and discussed.   Any x-rays performed were personally reviewed by myself.   Differential diagnosis were considered with the presenting HPI.  Diagnosis: Sepsis, Acute dyspnea, Tachycardia, Right hip pain  EKG: reviewed Filed Vitals:   08/27/13 1125 08/27/13 1145 08/27/13 1219 08/27/13 1253  BP:  103/52 117/55   Pulse:      Temp: 103.1 F (39.5 C)     TempSrc: Rectal     Resp:      Weight:      SpO2:    100%    Admission/  observation were discussed  with the admitting physician, patient and/or family and they are comfortable with the plan.       Enid Skeens, MD 08/27/13 1325

## 2013-08-27 NOTE — ED Notes (Signed)
Bed: WTR7 Expected date:  Expected time:  Means of arrival:  Comments: Chronic pain

## 2013-08-27 NOTE — Progress Notes (Signed)
ANTIBIOTIC CONSULT NOTE - INITIAL  Pharmacy Consult for Vancomycin and Zosyn Indication: rule out sepsis  Allergies  Allergen Reactions  . Oxycodone Other (See Comments)    "CX HER TO FELL LIKE SHE IS OUT OF HER BODY"    Patient Measurements: Height: 4' 9.87" (147 cm) Weight: 154 lb (69.854 kg) IBW/kg (Calculated) : 40.61  Vital Signs: Temp: 100.5 F (38.1 C) (02/10 1405) Temp src: Rectal (02/10 1405) BP: 119/56 mmHg (02/10 1345) Pulse Rate: 129 (02/10 1345) Intake/Output from previous day:   Intake/Output from this shift:    Labs:  Recent Labs  08/27/13 1110  WBC 10.5  HGB 13.5  PLT 254  CREATININE 0.78   Estimated Creatinine Clearance: 54.8 ml/min (by C-G formula based on Cr of 0.78). No results found for this basename: VANCOTROUGH, Leodis BinetVANCOPEAK, VANCORANDOM, GENTTROUGH, GENTPEAK, GENTRANDOM, TOBRATROUGH, TOBRAPEAK, TOBRARND, AMIKACINPEAK, AMIKACINTROU, AMIKACIN,  in the last 72 hours   Microbiology: Recent Results (from the past 720 hour(s))  CLOSTRIDIUM DIFFICILE BY PCR     Status: Abnormal   Collection Time    08/03/13  8:33 AM      Result Value Range Status   C difficile by pcr POSITIVE (*) NEGATIVE Final   Comment: RESULT CALLED TO, READ BACK BY AND VERIFIED WITH:     T. WATKINS AT 1605 ON 08/03/2013 BY S.VANHOORNE   2/10 blood x2: 2/10 urine: C.diff: ordered (+ on 1/17)  Medical History: Past Medical History  Diagnosis Date  . Hypothyroidism   . Anxiety   . Arthritis     Bilateral Knee DJD  . Headache(784.0)     SINUS HEADACHE OCCASSIONALLY  . Vertigo   . Multifactorial gait disorder   . Clostridium difficile carrier     pt stated that she has intermittent sx    Anti-infectives: 2/10 >> Vanc >> 2/10 >> Zosyn >> 2/10 >> Flagyl >>  Medications:  Scheduled:  . heparin  5,000 Units Subcutaneous Q8H  . ipratropium-albuterol      . levothyroxine  75 mcg Oral QAC breakfast   Infusions:  . sodium chloride 125 mL/hr at 08/27/13 1358  .  sodium chloride 100 mL/hr at 08/27/13 1358  . sodium chloride 125 mL/hr at 08/27/13 1358  . metronidazole 500 mg (08/27/13 1356)   Assessment: 68 yo female recently discharged from rehab following hospitalization for C.diff colitis presents to ED with weakness, diarrhea, dehydration and falls at home. Beginning broad spectrum antibiotics for SIRS, and Flagyl for recent C.diff (PCR pending).  Tmax: 100.5 WBC: 10.5 Renal: SCr 0.78, CrCl 54 ml/min CG  Goal of Therapy:  Vancomycin trough level 15-20 mcg/ml Zosyn dose per renal function  Plan:   Vancomycin 1g given in ED, continue with 500mg  IV q12h and check trough at steady state Zosyn 3.375gm IV q8h (4hr extended infusions) Follow up renal function & cultures Narrow antibacterials quickly given +C.diff   Loralee PacasErin Landy Mace, PharmD, BCPS Pager: [phone] 08/27/2013,2:12 PM

## 2013-08-28 DIAGNOSIS — R5383 Other fatigue: Secondary | ICD-10-CM

## 2013-08-28 DIAGNOSIS — R5381 Other malaise: Secondary | ICD-10-CM

## 2013-08-28 DIAGNOSIS — E860 Dehydration: Secondary | ICD-10-CM

## 2013-08-28 DIAGNOSIS — I959 Hypotension, unspecified: Secondary | ICD-10-CM

## 2013-08-28 DIAGNOSIS — E039 Hypothyroidism, unspecified: Secondary | ICD-10-CM

## 2013-08-28 DIAGNOSIS — R197 Diarrhea, unspecified: Secondary | ICD-10-CM

## 2013-08-28 LAB — URINE CULTURE

## 2013-08-28 LAB — CBC
HCT: 34.9 % — ABNORMAL LOW (ref 36.0–46.0)
Hemoglobin: 11.1 g/dL — ABNORMAL LOW (ref 12.0–15.0)
MCH: 28.7 pg (ref 26.0–34.0)
MCHC: 31.8 g/dL (ref 30.0–36.0)
MCV: 90.2 fL (ref 78.0–100.0)
PLATELETS: 215 10*3/uL (ref 150–400)
RBC: 3.87 MIL/uL (ref 3.87–5.11)
RDW: 16.4 % — AB (ref 11.5–15.5)
WBC: 10.9 10*3/uL — AB (ref 4.0–10.5)

## 2013-08-28 LAB — COMPREHENSIVE METABOLIC PANEL
ALT: 13 U/L (ref 0–35)
AST: 31 U/L (ref 0–37)
Albumin: 2.6 g/dL — ABNORMAL LOW (ref 3.5–5.2)
Alkaline Phosphatase: 40 U/L (ref 39–117)
BILIRUBIN TOTAL: 1 mg/dL (ref 0.3–1.2)
BUN: 11 mg/dL (ref 6–23)
CHLORIDE: 110 meq/L (ref 96–112)
CO2: 20 mEq/L (ref 19–32)
CREATININE: 0.65 mg/dL (ref 0.50–1.10)
Calcium: 6.6 mg/dL — ABNORMAL LOW (ref 8.4–10.5)
GFR calc Af Amer: >90 mL/min (ref >90)
GFR calc non Af Amer: 89 mL/min — ABNORMAL LOW (ref >90)
Glucose, Bld: 130 mg/dL — ABNORMAL HIGH (ref 70–99)
POTASSIUM: 3 meq/L — AB (ref 3.7–5.3)
Sodium: 141 mEq/L (ref 137–147)
TOTAL PROTEIN: 5.4 g/dL — AB (ref 6.0–8.3)

## 2013-08-28 LAB — CLOSTRIDIUM DIFFICILE BY PCR: Toxigenic C. Difficile by PCR: POSITIVE — AB

## 2013-08-28 MED ORDER — VANCOMYCIN 50 MG/ML ORAL SOLUTION: 125.0000 mg | Freq: Every day | ORAL | Status: DC

## 2013-08-28 MED ORDER — VANCOMYCIN 50 MG/ML ORAL SOLUTION: 125.0000 mg | ORAL | Status: DC

## 2013-08-28 MED ORDER — POTASSIUM CHLORIDE CRYS ER 20 MEQ PO TBCR
30.0000 meq | EXTENDED_RELEASE_TABLET | ORAL | Status: AC
  Administered 2013-08-28 (×2): 30 meq via ORAL
  Filled 2013-08-28 (×2): qty 1

## 2013-08-28 MED ORDER — PHENYLEPHRINE HCL 10 MG/ML IJ SOLN
30.0000 ug/min | INTRAVENOUS | Status: DC
  Administered 2013-08-28: 30 ug/min via INTRAVENOUS
  Administered 2013-08-28: 33 ug/min via INTRAVENOUS
  Filled 2013-08-28: qty 1

## 2013-08-28 MED ORDER — TRAMADOL HCL 50 MG PO TABS
50.0000 mg | ORAL_TABLET | Freq: Four times a day (QID) | ORAL | Status: DC | PRN
  Administered 2013-08-28 – 2013-09-08 (×16): 50 mg via ORAL
  Filled 2013-08-28 (×22): qty 1

## 2013-08-28 MED ORDER — VANCOMYCIN 50 MG/ML ORAL SOLUTION: 125.0000 mg | Freq: Two times a day (BID) | ORAL | Status: DC

## 2013-08-28 MED ORDER — VANCOMYCIN 50 MG/ML ORAL SOLUTION
125.0000 mg | Freq: Four times a day (QID) | ORAL | Status: DC
  Administered 2013-08-28 – 2013-08-31 (×13): 125 mg via ORAL
  Filled 2013-08-28 (×16): qty 2.5

## 2013-08-28 MED ORDER — TIOTROPIUM BROMIDE MONOHYDRATE 18 MCG IN CAPS
18.0000 ug | ORAL_CAPSULE | Freq: Every day | RESPIRATORY_TRACT | Status: DC
  Administered 2013-08-28 – 2013-09-08 (×12): 18 ug via RESPIRATORY_TRACT
  Filled 2013-08-28 (×3): qty 5

## 2013-08-28 NOTE — Progress Notes (Signed)
[facility] FOR AM LAB REPLACEMENT ONLY  The patient does apply for the [facility] Electroly[facility] Protocol based on the criteria listed below:   1. Is GFR >/= 40 ml/min? yes  Patient's GFR today is 89 2. Is urine output >/= 0.5 ml/kg/hr for the last 6 hours? yes Patient's UOP is .52 ml/kg/hr 3. Is BUN < 60 mg/dL? yes  Patient's BUN today is 11 4. Abnormal electrolyte(s):Potassium 5. Ordered repletion with: Potassium per Protocol  Amanda Armstrong P 08/28/2013 4:38 AM

## 2013-08-28 NOTE — Progress Notes (Signed)
[facility] PROGRESS NOTE  Trey SailorsRebekah P Fenoglio BJY:[id number] DOB: June 03, 1946 DOA: 08/27/2013 PCP: Kirk RuthsMCGOUGH,WILLIAM M, MD  Assessment/Plan: 1. Profound dehydration. Likely secondary to GI loss from multiple episodes of diarrhea. Continue IV fluid resuscitation, bleeding IV pressors, provide supportive care. Advancing diet this morning. 2. Hypotension likely secondary to hypovolemia. Patient's having a two-month history of diarrhea. Will continue providing IV fluids, with gradual weaning off of Neo-Synephrine. White count stable at 10,900, venous lactate at 1.3 with chest x-ray negative for pneumonia. Flu swab was negative with unremarkable UA. Will discontinue IV Zosyn and vancomycin for now. 3. Suspected C. difficile colitis. Patient presenting with multiple episodes of diarrhea having a history of C. difficile. For now will stop IV Zosyn and vancomycin, continue Flagyl. Stool for C. difficile is pending at the time of this dictation. 4. Hypokalemia. A.m. lab work showed a potassium of 3.0, likely secondary to GI loss from diarrhea. At potassium be replaced this morning.  5. Hypothyroidism. TSH within normal limits at 0.696. Continue thyroid replacement therapy. 6. DVT prophylaxis. Heparin subcutaneous   Code Status: Full code Disposition Plan: Pending stool for C. difficile, continue monitoring in the ICU remains on Neo-Synephrine which we plan to wean down today.   Consultants:  Pulmonary critical care medicine   Antibiotics:  Vancomycin (discontinued on 08/28/2013)  Zosyn (discontinued on 08/28/2013)  Flagyl started on 08/27/2013  HPI/Subjective: Patient is a pleasant 68 year old female with a past medical history of C. difficile colitis, admitted to the medicine service on 08/27/2013, presenting with generalized weakness, undergoing dehydration, recurrent falls at home. Symptoms attributed to profound dehydration likely secondary to GI loss from long-standing diarrhea, found to  be hypotensive, requiring step down unit admission and IV pressors. Patient's blood pressures improved and this morning, Neo-Synephrine been weaned down. She is awake alert and oriented this morning, however complains of ongoing generalized weakness. Had 3 episodes of diarrhea overnight, stool for C. difficile pending.   Objective: Filed Vitals:   08/28/13 0645  BP: 121/46  Pulse: 76  Temp: 97.9 F (36.6 C)  Resp: 23    Intake/Output Summary (Last 24 hours) at 08/28/13 0809 Last data filed at 08/28/13 0800  Gross per 24 hour  Intake 3662.35 ml  Output    563 ml  Net 3099.35 ml   Filed Weights   08/27/13 1018 08/27/13 1434 08/27/13 1439  Weight: 69.854 kg (154 lb) 69.8 kg (153 lb 14.1 oz) 69.8 kg (153 lb 14.1 oz)    Exam:   General:  Patient is in no acute distress awake alert, states feeling weak  Cardiovascular: Regular rate rhythm normal S1-S2  Respiratory: Normal respiratory effort, lungs are clear to auscultation bilaterally  Abdomen: Patient having a generalized abdominal pain to palpation, positive bowel, no peritoneal signs noted  Musculoskeletal: She reports pain to anterior shin, able to flex and extend that knee as well as hip.   Data Reviewed: Basic Metabolic Panel:  Recent Labs Lab 08/27/13 1110 08/28/13 0339  NA 138 141  K 3.5* 3.0*  CL 98 110  CO2 21 20  GLUCOSE 137* 130*  BUN 12 11  CREATININE 0.78 0.65  CALCIUM 8.6 6.6*   Liver Function Tests:  Recent Labs Lab 08/27/13 1110 08/28/13 0339  AST 15 31  ALT 14 13  ALKPHOS 60 40  BILITOT 0.9 1.0  PROT 7.5 5.4*  ALBUMIN 4.1 2.6*   No results found for this basename: LIPASE, AMYLASE,  in the last 168 hours No results found for this basename:  AMMONIA,  in the last 168 hours CBC:  Recent Labs Lab 08/27/13 1110 08/28/13 0339  WBC 10.5 10.9*  NEUTROABS 8.0*  --   HGB 13.5 11.1*  HCT 41.8 34.9*  MCV 90.5 90.2  PLT 254 215   Cardiac Enzymes:  Recent Labs Lab 08/27/13 1110   TROPONINI <0.30   BNP (last 3 results)  Recent Labs  05/28/13 0545 08/27/13 1110  PROBNP 1179.0* 208.9*   CBG: No results found for this basename: GLUCAP,  in the last 168 hours  Recent Results (from the past 240 hour(s))  MRSA PCR SCREENING     Status: None   Collection Time    08/27/13  2:50 PM      Result Value Ref Range Status   MRSA by PCR NEGATIVE  NEGATIVE Final   Comment:            The GeneXpert MRSA Assay (FDA     approved for NASAL specimens     only), is one component of a     comprehensive MRSA colonization     surveillance program. It is not     intended to diagnose MRSA     infection nor to guide or     monitor treatment for     MRSA infections.     Studies: Dg Chest 2 View  08/27/2013   CLINICAL DATA:  Shortness of breath.  COPD.  Pain.  EXAM: CHEST  2 VIEW  COMPARISON:  08/02/2013, 05/29/2013, 05/27/2013, and 06/09/2013  FINDINGS: Heart size and pulmonary vascularity are normal. Lungs are clear. No acute osseous abnormality.  IMPRESSION: No active cardiopulmonary disease.   Electronically Signed   By: Geanie Cooley M.D.   On: 08/27/2013 12:17   Dg Lumbar Spine Complete  08/27/2013   CLINICAL DATA:  Low back pain following injury  EXAM: LUMBAR SPINE - COMPLETE 4+ VIEW  COMPARISON:  None.  FINDINGS: Vertebral body height is well maintained. Disc space narrowing is noted from the L3-S1. Associated facet hypertrophic changes are seen. No acute abnormality is noted.  IMPRESSION: Degenerative change without acute abnormality.   Electronically Signed   By: Alcide Clever M.D.   On: 08/27/2013 12:17   Dg Hip Complete Right  08/27/2013   CLINICAL DATA:  Right hip pain following recent injury  EXAM: RIGHT HIP - COMPLETE 2+ VIEW  COMPARISON:  None.  FINDINGS: Pelvic ring is intact. No acute fracture or dislocation is noted. Irregular calcification is noted in the left hemipelvis likely related to a uterine fibroid.  IMPRESSION: No acute abnormality noted.   Electronically  Signed   By: Alcide Clever M.D.   On: 08/27/2013 12:16    Scheduled Meds: . [COMPLETED] sodium chloride   Intravenous STAT  . antiseptic oral rinse  15 mL Mouth Rinse BID  . heparin  5,000 Units Subcutaneous 3 times per day  . levothyroxine  75 mcg Oral QAC breakfast  . metronidazole  500 mg Intravenous Q8H   Continuous Infusions: . sodium chloride 100 mL/hr at 08/27/13 1358  . phenylephrine (NEO-SYNEPHRINE) Adult infusion 30 mcg/min (08/28/13 0800)    Principal Problem:   SIRS (systemic inflammatory response syndrome) Active Problems:   Arthritis   Hypothyroidism   Tachycardia   Weakness    Time spent: 40 minutes    Jeralyn Bennett  [facility] Pager [phone]. If 7PM-7AM, please contact night-coverage at [email or web address], password [email or web address] 08/28/2013, 8:09 AM  LOS: 1 day

## 2013-08-28 NOTE — Clinical Documentation Improvement (Signed)
Noted 08/28/13 Progr Note..."1. Profound dehydration. Likely secondary to GI loss from multiple episodes of diarrhea. Continue IV fluid resuscitation, bleeding IV pressors, provide supportive care. Advancing diet this morning. 2. Hypotension likely secondary to hypovolemia. Patient's having a two-month history of diarrhea. Will continue providing IV fluids, with gradual weaning off of Neo-Synephrine."..." 08/27/13 per ED MD note.Marland Kitchen.Marland Kitchen."Presented in respiratory distress with hr 140s,"... For accurate Dx specificity & severity can noted clinical findings be further specified w/ cond being eval'd, mon'd & tx'd. Thank you   Possible Clinical Conditions? Septic Shock Hypovolemic Shock Other Condition Cannot Clinically determine  Signs & Symptoms: See above note Diagnostics: See above note Treatment: See above note  Thank You, Toribio Harbourphelia R Issaac Shipper, RN, BSN, CCDS Certified Clinical Documentation Specialist Pager: [phone] [facility]- [facility] Information Management

## 2013-08-29 DIAGNOSIS — R578 Other shock: Secondary | ICD-10-CM

## 2013-08-29 DIAGNOSIS — R571 Hypovolemic shock: Secondary | ICD-10-CM | POA: Diagnosis present

## 2013-08-29 DIAGNOSIS — A0472 Enterocolitis due to Clostridium difficile, not specified as recurrent: Secondary | ICD-10-CM | POA: Diagnosis present

## 2013-08-29 MED ORDER — POTASSIUM CHLORIDE CRYS ER 20 MEQ PO TBCR
40.0000 meq | EXTENDED_RELEASE_TABLET | Freq: Four times a day (QID) | ORAL | Status: AC
  Administered 2013-08-29 (×2): 40 meq via ORAL
  Filled 2013-08-29 (×2): qty 2

## 2013-08-29 NOTE — Progress Notes (Signed)
[facility] PROGRESS NOTE  Amanda Armstrong ZOX:[id number] DOB: 1946-02-24 DOA: 08/27/2013 PCP: [name], MD  Assessment/Plan: 1. Profound dehydration. Likely secondary to GI loss from multiple episodes of diarrhea. Continue IV fluid resuscitation, bleeding IV pressors, provide supportive care. Advancing diet this morning. 2. Hypovolemic shock, requiring IV pressors initially, now resolved as IV pressors were weaned off on 08/28/13.  Patient having a two-month history of diarrhea secondary to C.diff, as she presented profoundly dehydrated.  3. C. difficile colitis. Patient presenting with multiple episodes of diarrhea having a history of C. Difficile. IV Zosyn and vancomycin were stopped on 08/28/13, as Oral Vanc was started on 08/28/13. Continue providing supportive care,IV fluids, electrolyte replacement. She reports improvement today.  4. Hypokalemia. A.m. lab work showed a potassium of 3.0, likely secondary to GI loss from diarrhea. Will provide Kdur x 2 doses.  5. Hypothyroidism. TSH within normal limits at 0.696. Continue thyroid replacement therapy. 6. DVT prophylaxis. Heparin subcutaneous   Code Status: Full code Disposition Plan: Clinically improved, will transfer to MedSurg today.   Consultants:  Pulmonary critical care medicine   Antibiotics:  Vancomycin (discontinued on 08/28/2013)  Zosyn (discontinued on 08/28/2013)  Oral Vancomycin (started on 08/28/13)  HPI/Subjective: Patient is a pleasant 68 year old female with a past medical history of C. difficile colitis, admitted to the medicine service on 08/27/2013, presenting with generalized weakness, undergoing dehydration, recurrent falls at home. Symptoms attributed to profound dehydration likely secondary to GI loss from long-standing diarrhea, found to be hypotensive, requiring step down unit admission and IV pressors. Patient's blood pressures improved and this morning, Neo-Synephrine been weaned  down.  Patient doing much better today, she has been weaned off of pressor support, maintaining blood pressures. Also reports improvement to diarrhea, tolerating PO intake.  Objective: Filed Vitals:   08/29/13 0800  BP: 131/69  Pulse: 87  Temp:   Resp: 17    Intake/Output Summary (Last 24 hours) at 08/29/13 8119 Last data filed at 08/29/13 0800  Gross per 24 hour  Intake 3306.67 ml  Output    985 ml  Net 2321.67 ml   Filed Weights   08/27/13 1018 08/27/13 1434 08/27/13 1439  Weight: 69.854 kg (154 lb) 69.8 kg (153 lb 14.1 oz) 69.8 kg (153 lb 14.1 oz)    Exam:   General:  Patient is in no acute distress awake alert, states feeling weak  Cardiovascular: Regular rate rhythm normal S1-S2  Respiratory: Normal respiratory effort, lungs are clear to auscultation bilaterally  Abdomen: Patient having a generalized abdominal pain to palpation, positive bowel, no peritoneal signs noted  Musculoskeletal: She reports pain to anterior shin, able to flex and extend that knee as well as hip.   Data Reviewed: Basic Metabolic Panel:  Recent Labs Lab 08/27/13 1110 08/28/13 0339  NA 138 141  K 3.5* 3.0*  CL 98 110  CO2 21 20  GLUCOSE 137* 130*  BUN 12 11  CREATININE 0.78 0.65  CALCIUM 8.6 6.6*   Liver Function Tests:  Recent Labs Lab 08/27/13 1110 08/28/13 0339  AST 15 31  ALT 14 13  ALKPHOS 60 40  BILITOT 0.9 1.0  PROT 7.5 5.4*  ALBUMIN 4.1 2.6*   No results found for this basename: LIPASE, AMYLASE,  in the last 168 hours No results found for this basename: AMMONIA,  in the last 168 hours CBC:  Recent Labs Lab 08/27/13 1110 08/28/13 0339  WBC 10.5 10.9*  NEUTROABS 8.0*  --   HGB 13.5 11.1*  HCT 41.8 34.9*  MCV 90.5 90.2  PLT 254 215   Cardiac Enzymes:  Recent Labs Lab 08/27/13 1110  TROPONINI <0.30   BNP (last 3 results)  Recent Labs  05/28/13 0545 08/27/13 1110  PROBNP 1179.0* 208.9*   CBG: No results found for this basename: GLUCAP,  in  the last 168 hours  Recent Results (from the past 240 hour(s))  CULTURE, BLOOD (ROUTINE X 2)     Status: None   Collection Time    08/27/13 11:10 AM      Result Value Ref Range Status   Specimen Description BLOOD RIGHT ARM   Final   Special Requests BOTTLES DRAWN AEROBIC AND ANAEROBIC 5ML   Final   Culture  Setup Time     Final   Value: 08/27/2013 14:17     Performed at [facility]   Culture     Final   Value:        BLOOD CULTURE RECEIVED NO GROWTH TO DATE CULTURE WILL BE HELD FOR 5 DAYS BEFORE ISSUING A FINAL NEGATIVE REPORT     Performed at [facility]   Report Status PENDING   Incomplete  URINE CULTURE     Status: None   Collection Time    08/27/13 11:30 AM      Result Value Ref Range Status   Specimen Description URINE, CATHETERIZED   Final   Special Requests NONE   Final   Culture  Setup Time     Final   Value: 08/27/2013 14:20     Performed at [facility]   Colony Count     Final   Value: 50,000 COLONIES/ML     Performed at [facility]   Culture     Final   Value: Multiple bacterial morphotypes present, none predominant. Suggest appropriate recollection if clinically indicated.     Performed at [facility]   Report Status 08/28/2013 FINAL   Final  CULTURE, BLOOD (ROUTINE X 2)     Status: None   Collection Time    08/27/13 11:38 AM      Result Value Ref Range Status   Specimen Description BLOOD LEFT FOREARM   Final   Special Requests BOTTLES DRAWN AEROBIC AND ANAEROBIC 5ML   Final   Culture  Setup Time     Final   Value: 08/27/2013 14:17     Performed at [facility]   Culture     Final   Value:        BLOOD CULTURE RECEIVED NO GROWTH TO DATE CULTURE WILL BE HELD FOR 5 DAYS BEFORE ISSUING A FINAL NEGATIVE REPORT     Performed at [facility]   Report Status PENDING   Incomplete  MRSA PCR SCREENING     Status: None   Collection Time    08/27/13  2:50 PM      Result Value Ref Range Status   MRSA by PCR  NEGATIVE  NEGATIVE Final   Comment:            The GeneXpert MRSA Assay (FDA     approved for NASAL specimens     only), is one component of a     comprehensive MRSA colonization     surveillance program. It is not     intended to diagnose MRSA     infection nor to guide or     monitor treatment for     MRSA infections.  CLOSTRIDIUM DIFFICILE BY  PCR     Status: Abnormal   Collection Time    08/27/13  8:27 PM      Result Value Ref Range Status   C difficile by pcr POSITIVE (*) NEGATIVE Final   Comment: CRITICAL RESULT CALLED TO, READ BACK BY AND VERIFIED WITH:     Aris Georgia RN 10:40 08/28/13 ([email or web address])     Performed at [facility]     Studies: Dg Chest 2 View  08/27/2013   CLINICAL DATA:  Shortness of breath.  COPD.  Pain.  EXAM: CHEST  2 VIEW  COMPARISON:  08/02/2013, 05/29/2013, 05/27/2013, and 06/09/2013  FINDINGS: Heart size and pulmonary vascularity are normal. Lungs are clear. No acute osseous abnormality.  IMPRESSION: No active cardiopulmonary disease.   Electronically Signed   By: Geanie Cooley M.D.   On: 08/27/2013 12:17   Dg Lumbar Spine Complete  08/27/2013   CLINICAL DATA:  Low back pain following injury  EXAM: LUMBAR SPINE - COMPLETE 4+ VIEW  COMPARISON:  None.  FINDINGS: Vertebral body height is well maintained. Disc space narrowing is noted from the L3-S1. Associated facet hypertrophic changes are seen. No acute abnormality is noted.  IMPRESSION: Degenerative change without acute abnormality.   Electronically Signed   By: Alcide Clever M.D.   On: 08/27/2013 12:17   Dg Hip Complete Right  08/27/2013   CLINICAL DATA:  Right hip pain following recent injury  EXAM: RIGHT HIP - COMPLETE 2+ VIEW  COMPARISON:  None.  FINDINGS: Pelvic ring is intact. No acute fracture or dislocation is noted. Irregular calcification is noted in the left hemipelvis likely related to a uterine fibroid.  IMPRESSION: No acute abnormality noted.   Electronically Signed   By: Alcide Clever M.D.    On: 08/27/2013 12:16    Scheduled Meds: . antiseptic oral rinse  15 mL Mouth Rinse BID  . heparin  5,000 Units Subcutaneous 3 times per day  . levothyroxine  75 mcg Oral QAC breakfast  . potassium chloride  40 mEq Oral Q6H  . tiotropium  18 mcg Inhalation QHS  . vancomycin  125 mg Oral QID   Followed by  . [START ON 09/04/2013] vancomycin  125 mg Oral BID   Followed by  . [START ON 09/12/2013] vancomycin  125 mg Oral Daily   Followed by  . [START ON 09/19/2013] vancomycin  125 mg Oral QODAY   Followed by  . [START ON 09/27/2013] vancomycin  125 mg Oral Q3 days   Continuous Infusions: . sodium chloride 100 mL/hr at 08/29/13 0601  . phenylephrine (NEO-SYNEPHRINE) Adult infusion Stopped (08/28/13 0850)    Principal Problem:   SIRS (systemic inflammatory response syndrome) Active Problems:   Arthritis   Hypothyroidism   Tachycardia   Weakness    Time spent: 35 minutes    Jeralyn Bennett  [facility] Pager [phone]. If 7PM-7AM, please contact night-coverage at [email or web address], password [facility] 08/29/2013, 9:04 AM  LOS: 2 days

## 2013-08-30 LAB — BASIC METABOLIC PANEL
BUN: 4 mg/dL — AB (ref 6–23)
CO2: 20 mEq/L (ref 19–32)
Calcium: 7.5 mg/dL — ABNORMAL LOW (ref 8.4–10.5)
Chloride: 111 mEq/L (ref 96–112)
Creatinine, Ser: 0.58 mg/dL (ref 0.50–1.10)
GFR calc non Af Amer: >90 mL/min (ref >90)
Glucose, Bld: 80 mg/dL (ref 70–99)
POTASSIUM: 3.1 meq/L — AB (ref 3.7–5.3)
Sodium: 143 mEq/L (ref 137–147)

## 2013-08-30 LAB — CBC
HCT: 32.4 % — ABNORMAL LOW (ref 36.0–46.0)
HEMOGLOBIN: 10.4 g/dL — AB (ref 12.0–15.0)
MCH: 28.5 pg (ref 26.0–34.0)
MCHC: 32.1 g/dL (ref 30.0–36.0)
MCV: 88.8 fL (ref 78.0–100.0)
PLATELETS: 213 10*3/uL (ref 150–400)
RBC: 3.65 MIL/uL — ABNORMAL LOW (ref 3.87–5.11)
RDW: 16.8 % — ABNORMAL HIGH (ref 11.5–15.5)
WBC: 7.4 10*3/uL (ref 4.0–10.5)

## 2013-08-30 MED ORDER — TRAMADOL HCL 50 MG PO TABS
50.0000 mg | ORAL_TABLET | Freq: Once | ORAL | Status: AC
  Administered 2013-08-30: 50 mg via ORAL

## 2013-08-30 MED ORDER — POTASSIUM CHLORIDE CRYS ER 20 MEQ PO TBCR
40.0000 meq | EXTENDED_RELEASE_TABLET | Freq: Four times a day (QID) | ORAL | Status: AC
  Administered 2013-08-30 (×2): 40 meq via ORAL
  Filled 2013-08-30 (×2): qty 2

## 2013-08-30 NOTE — Progress Notes (Addendum)
Clinical Social Work Department CLINICAL SOCIAL WORK PLACEMENT NOTE 08/30/2013  Patient:  Amanda SailorsERDUE,[name]  Account Number:  [id number] Admit date:  08/27/2013  Clinical Social Worker:  Cori RazorJAMIE HAIDINGER, LCSW  Date/time:  08/30/2013 03:56 PM  Clinical Social Work is seeking post-discharge placement for this patient at the following level of care:   SKILLED NURSING   (*CSW will update this form in Epic as items are completed)   08/30/2013  [facility]/family provided with [facility] of Clinical [facility] Work's list of facilities offering this level of care within the geographic area requested by the patient (or if unable, by the patient's family).  08/30/2013  Patient/family informed of their freedom to choose among providers that offer the needed level of care, that participate in Medicare, Medicaid or managed care program needed by the patient, have an available bed and are willing to accept the patient.  08/30/2013  Patient/family informed of [facility]' ownership interest in [facility], as well as of the fact that they are under no obligation to receive care at this facility.  PASARR submitted to EDS on 08/30/2013 PASARR number received from EDS on 08/30/2013  FL2 transmitted to all facilities in geographic area requested by pt/family on  08/30/2013 FL2 transmitted to all facilities within larger geographic area on   Patient informed that his/her managed care company has contracts with or will negotiate with  certain facilities, including the following:     Patient/family informed of bed offers received:  09/01/2013 [facility] Physician recommends and patient chooses bed at    [facility] on 09/09/2013 Patient to be transferred to facility by ambulance [name]([facility])  The following physician request were entered in Epic:   Additional Comments:   Cori RazorJamie Haidinger LCSW [phone]   Loletta SpecterSuzanna  Kidd, MSW, LCSW Clinical Social Work [phone]

## 2013-08-30 NOTE — Progress Notes (Signed)
Clinical Social Work Department BRIEF PSYCHOSOCIAL ASSESSMENT 08/30/2013  Patient:  Amanda Armstrong, Amanda Armstrong     Account Number:  [id number]     Admit date:  08/27/2013  Clinical Social Worker:  Lacie Scotts  Date/Time:  08/30/2013 03:22 PM  Referred by:  CSW  Date Referred:  08/30/2013 Referred for  SNF Placement   Other Referral:   Interview type:  Patient Other interview type:    PSYCHOSOCIAL DATA Living Status:  HUSBAND Admitted from facility:   Level of care:   Primary support name:  Roby Lofts Primary support relationship to patient:  SPOUSE Degree of support available:   supportive    CURRENT CONCERNS Current Concerns  Post-Acute Placement   Other Concerns:    SOCIAL WORK ASSESSMENT / PLAN  Assessment/plan status:   Other assessment/ plan:   Pt is a 68 yr old female living at home prior to hospitalization. Pt admitted with SIRS on 08/27/13. PN notes reviewed. CSW met with pt to assist with d/c planning. PT is recommending SNF placement following hospital d/c. Pt is in agreement with this plan and gave CSW permission to initiated SNF search in [facility]. CSW will continue to follow to assist with d/c planning.   Information/referral to community resources:   SNF list provided.    PATIENT'S/FAMILY'S RESPONSE TO PLAN OF CARE: " I think rehab is a good idea. I've been to [facility]. I won't go back there. I'd like to go to [facility]." Pt is looking forward to feeling better .   Werner Lean LCSW [phone]

## 2013-08-30 NOTE — Progress Notes (Signed)
Patient with audible wheezes this am. Encouraged deep breathing and coughing. Given IS for use. sats 95% room air.

## 2013-08-30 NOTE — Evaluation (Signed)
Physical Therapy Evaluation Patient Details Name: Amanda Armstrong MRN: [id number] DOB: 11/17/1945 Today's Date: 08/30/2013 Time: [id number] PT Time Calculation (min): 22 min  PT Assessment / Plan / Recommendation History of Present Illness  68 yo female admitted with SIRS, dehydration, hypotension. Hx of c diff, falls, vertigo, gait d/o, anxiety.   Clinical Impression  On eval, pt required Mod assist for mobility-able to ambulate ~45 feet with walker. Demonstrates general weakness, decreased activity tolerance, and impaired gait and balance. Recommend ST rehab at SNF, depending on progress.     PT Assessment  Patient needs continued PT services    Follow Up Recommendations  SNF    Does the patient have the potential to tolerate intense rehabilitation      Barriers to Discharge        Equipment Recommendations  None recommended by PT    Recommendations for Other Services OT consult   Frequency Min 3X/week    Precautions / Restrictions Precautions Precautions: Fall Precaution Comments: C diff precautions Restrictions Weight Bearing Restrictions: No   Pertinent Vitals/Pain r thigh area 5/10 with activity      Mobility  Bed Mobility Overal bed mobility: Needs Assistance Bed Mobility: Supine to Sit Supine to sit: Mod assist General bed mobility comments: Assist for trunk to upright and bil Les off bed. Increased time. VCs safety, technique, hand placement. Utilized bedpad for scooting, positioning.  Transfers Overall transfer level: Needs assistance Transfers: Sit to/from Stand Sit to Stand: Min assist General transfer comment: Assist to rise, stabilize, control descent. vCS safety, technique, hand placement.  Ambulation/Gait Ambulation/Gait assistance: Min assist Ambulation Distance (Feet): 45 Feet (45'x1, 30'x1) Assistive device: Rolling walker (2 wheeled) General Gait Details: Assist to stabilize throughout ambulation. Pt fatigues easily. Followed closely with  recliner. 1 seated rest break needed between walks.     Exercises     PT Diagnosis: Difficulty walking;Generalized weakness;Acute pain  PT Problem List: Decreased strength;Decreased activity tolerance;Decreased balance;Decreased mobility;Pain;Decreased knowledge of use of DME PT Treatment Interventions: DME instruction;Gait training;Functional mobility training;Therapeutic activities;Therapeutic exercise;Patient/family education;Balance training     PT Goals(Current goals can be found in the care plan section) Acute Rehab PT Goals Patient Stated Goal: feel better. regain strength PT Goal Formulation: With patient Time For Goal Achievement: 09/13/13 Potential to Achieve Goals: Good  Visit Information  Last PT Received On: 08/30/13 Assistance Needed: +1 History of Present Illness: 68 yo female admitted with SIRS, dehydration, hypotension. Hx of c diff, falls, vertigo, gait d/o, anxiety.        Prior Functioning       Cognition  Cognition Arousal/Alertness: Awake/Armstrong Behavior During Therapy: WFL for tasks assessed/performed Overall Cognitive Status: Within Functional Limits for tasks assessed    Extremity/Trunk Assessment Upper Extremity Assessment Upper Extremity Assessment: Generalized weakness Lower Extremity Assessment Lower Extremity Assessment: Generalized weakness Cervical / Trunk Assessment Cervical / Trunk Assessment: Normal   Balance    End of Session PT - End of Session Equipment Utilized During Treatment: Gait belt Activity Tolerance: Patient limited by fatigue Patient left: in chair;with call bell/phone within reach Nurse Communication: Mobility status  GP     Amanda AlertJannie Seon Armstrong, MPT Pager: [phone]

## 2013-08-30 NOTE — Plan of Care (Signed)
Problem: Phase II Progression Outcomes Goal: Activity at appropriate level-compared to baseline (UP IN CHAIR FOR HEMODIALYSIS)  Outcome: Not Met (add Reason) PT eval pending

## 2013-08-30 NOTE — Progress Notes (Addendum)
[facility] PROGRESS NOTE  Amanda Armstrong ZOX:[id number] DOB: [birth year] DOA: 08/27/2013 PCP: [name], MD  Assessment/Plan: 1. Profound dehydration. Likely secondary to GI loss from multiple episodes of diarrhea. Resolving.  2. Hypovolemic shock, requiring IV pressors initially, now resolved as IV pressors were weaned off on 08/28/13.  Patient having a two-month history of diarrhea secondary to C.diff. Resolved.  3. Sepsis, present on admission, evidenced by hypotension that required pressor support, respiratory rate of 27, HR of 121. Source of infection likely to be from C diff colitis. She continues to show daily improvement.   4. C. difficile colitis. Patient presenting with multiple episodes of diarrhea having a history of C. Difficile. IV Zosyn and vancomycin were stopped on 08/28/13, as Oral Vanc was started on 08/28/13. She continues to show daily improvement. Will continue oral vancomycin.  5. Hypokalemia. A.m. lab work showed a potassium of 3.1, likely secondary to GI loss from diarrhea. Will provide Kdur x 2 doses.  6. Hypothyroidism. TSH within normal limits at 0.696. Continue thyroid replacement therapy. 7. Deconditioning. Patient deconditioned, continues to have generalized weakness, physical therapy consulted. May benefit from SNF.   8. DVT prophylaxis. Heparin subcutaneous   Code Status: Full code Disposition Plan: PT consult placed, she may benefit from short term rehab at [facility].    Consultants:  Pulmonary critical care medicine   Antibiotics:  Vancomycin (discontinued on 08/28/2013)  Zosyn (discontinued on 08/28/2013)  Oral Vancomycin (started on 08/28/13)  HPI/Subjective: Patient is a pleasant 68 year old female with a past medical history of C. difficile colitis, admitted to the medicine service on 08/27/2013, presenting with generalized weakness, undergoing dehydration, recurrent falls at home. Symptoms attributed to profound dehydration likely  secondary to GI loss from long-standing diarrhea, found to be hypotensive, requiring step down unit admission and IV pressors. Patient's blood pressures improved and this morning, Neo-Synephrine been weaned down.  She reports feeling much better today, did not have diarrhea over night. Tolerating regular diet. PT consultation placed.   Objective: Filed Vitals:   08/30/13 0514  BP: 129/65  Pulse: 70  Temp: 98 F (36.7 C)  Resp: 19    Intake/Output Summary (Last 24 hours) at 08/30/13 0810 Last data filed at 08/30/13 8119  Gross per 24 hour  Intake 3756.33 ml  Output      0 ml  Net 3756.33 ml   Filed Weights   08/27/13 1434 08/27/13 1439 08/29/13 1223  Weight: 69.8 kg (153 lb 14.1 oz) 69.8 kg (153 lb 14.1 oz) 74.889 kg (165 lb 1.6 oz)    Exam:   General:  Patient is in no acute distress awake alert, states feeling weak  Cardiovascular: Regular rate rhythm normal S1-S2  Respiratory: Normal respiratory effort, lungs are clear to auscultation bilaterally  Abdomen: Patient having a generalized abdominal pain to palpation, positive bowel, no peritoneal signs noted  Musculoskeletal: Continues to have some pain to bilateral legs, trace edema to lower extremities.   Data Reviewed: Basic Metabolic Panel:  Recent Labs Lab 08/27/13 1110 08/28/13 0339 08/30/13 0500  NA 138 141 143  K 3.5* 3.0* 3.1*  CL 98 110 111  CO2 21 20 20   GLUCOSE 137* 130* 80  BUN 12 11 4*  CREATININE 0.78 0.65 0.58  CALCIUM 8.6 6.6* 7.5*   Liver Function Tests:  Recent Labs Lab 08/27/13 1110 08/28/13 0339  AST 15 31  ALT 14 13  ALKPHOS 60 40  BILITOT 0.9 1.0  PROT 7.5 5.4*  ALBUMIN 4.1 2.6*  No results found for this basename: LIPASE, AMYLASE,  in the last 168 hours No results found for this basename: AMMONIA,  in the last 168 hours CBC:  Recent Labs Lab 08/27/13 1110 08/28/13 0339 08/30/13 0500  WBC 10.5 10.9* 7.4  NEUTROABS 8.0*  --   --   HGB 13.5 11.1* 10.4*  HCT 41.8  34.9* 32.4*  MCV 90.5 90.2 88.8  PLT 254 215 213   Cardiac Enzymes:  Recent Labs Lab 08/27/13 1110  TROPONINI <0.30   BNP (last 3 results)  Recent Labs  05/28/13 0545 08/27/13 1110  PROBNP 1179.0* 208.9*   CBG: No results found for this basename: GLUCAP,  in the last 168 hours  Recent Results (from the past 240 hour(s))  CULTURE, BLOOD (ROUTINE X 2)     Status: None   Collection Time    08/27/13 11:10 AM      Result Value Ref Range Status   Specimen Description BLOOD RIGHT ARM   Final   Special Requests BOTTLES DRAWN AEROBIC AND ANAEROBIC   Final   Culture  Setup Time     Final   Value: 08/27/2013 14:17     Performed at [facility]   Culture     Final   Value:        BLOOD CULTURE RECEIVED NO GROWTH TO DATE CULTURE WILL BE HELD FOR 5 DAYS BEFORE ISSUING A FINAL NEGATIVE REPORT     Performed at [facility]   Report Status PENDING   Incomplete  URINE CULTURE     Status: None   Collection Time    08/27/13 11:30 AM      Result Value Ref Range Status   Specimen Description URINE, CATHETERIZED   Final   Special Requests NONE   Final   Culture  Setup Time     Final   Value: 08/27/2013 14:20     Performed at [facility] Count     Final   Value: 50,000 COLONIES/ML     Performed at [facility]   Culture     Final   Value: Multiple bacterial morphotypes present, none predominant. Suggest appropriate recollection if clinically indicated.     Performed at [facility]   Report Status 08/28/2013 FINAL   Final  CULTURE, BLOOD (ROUTINE X 2)     Status: None   Collection Time    08/27/13 11:38 AM      Result Value Ref Range Status   Specimen Description BLOOD LEFT FOREARM   Final   Special Requests BOTTLES DRAWN AEROBIC AND ANAEROBIC   Final   Culture  Setup Time     Final   Value: 08/27/2013 14:17     Performed at [facility]   Culture     Final   Value:        BLOOD CULTURE RECEIVED NO GROWTH TO DATE  CULTURE WILL BE HELD FOR 5 DAYS BEFORE ISSUING A FINAL NEGATIVE REPORT     Performed at [facility]   Report Status PENDING   Incomplete  MRSA PCR SCREENING     Status: None   Collection Time    08/27/13  2:50 PM      Result Value Ref Range Status   MRSA by PCR NEGATIVE  NEGATIVE Final   Comment:            The GeneXpert MRSA Assay (FDA     approved for NASAL  specimens     only), is one component of a     comprehensive MRSA colonization     surveillance program. It is not     intended to diagnose MRSA     infection nor to guide or     monitor treatment for     MRSA infections.  CLOSTRIDIUM DIFFICILE BY PCR     Status: Abnormal   Collection Time    08/27/13  8:27 PM      Result Value Ref Range Status   C difficile by pcr POSITIVE (*) NEGATIVE Final   Comment: CRITICAL RESULT CALLED TO, READ BACK BY AND VERIFIED WITH:     Aris GeorgiaN. RICHARDSON RN 10:40 08/28/13 ([email or web address])     [facility]     Studies: No results found.  Scheduled Meds: . antiseptic oral rinse  15 mL Mouth Rinse BID  . heparin  5,000 Units Subcutaneous 3 times per day  . levothyroxine  75 mcg Oral QAC breakfast  . potassium chloride  40 mEq Oral Q6H  . tiotropium  18 mcg Inhalation QHS  . vancomycin  125 mg Oral QID   Followed by  . [START ON 09/04/2013] vancomycin  125 mg Oral BID   Followed by  . [START ON 09/12/2013] vancomycin  125 mg Oral Daily   Followed by  . [START ON 09/19/2013] vancomycin  125 mg Oral QODAY   Followed by  . [START ON 09/27/2013] vancomycin  125 mg Oral Q3 days   Continuous Infusions: . sodium chloride 1,000 mL (08/30/13 0211)    Principal Problem:   SIRS (systemic inflammatory response syndrome) Active Problems:   Hypovolemic shock   Enteritis due to Clostridium difficile   Arthritis   Hypothyroidism   Tachycardia   Weakness    Time spent: 35 minutes    Jeralyn BennettZAMORA, Mystery Schrupp  [facility] Pager [phone]. If 7PM-7AM, please contact  night-coverage at [email or web address], password [email or web address] 08/30/2013, 8:10 AM  LOS: 3 days

## 2013-08-31 ENCOUNTER — Inpatient Hospital Stay (HOSPITAL_COMMUNITY): Payer: Medicare HMO

## 2013-08-31 LAB — CBC
HCT: 34 % — ABNORMAL LOW (ref 36.0–46.0)
Hemoglobin: 11.1 g/dL — ABNORMAL LOW (ref 12.0–15.0)
MCH: 28.7 pg (ref 26.0–34.0)
MCHC: 32.6 g/dL (ref 30.0–36.0)
MCV: 87.9 fL (ref 78.0–100.0)
Platelets: 249 10*3/uL (ref 150–400)
RBC: 3.87 MIL/uL (ref 3.87–5.11)
RDW: 16.7 % — AB (ref 11.5–15.5)
WBC: 7.7 10*3/uL (ref 4.0–10.5)

## 2013-08-31 LAB — BASIC METABOLIC PANEL
BUN: 5 mg/dL — AB (ref 6–23)
CALCIUM: 8.6 mg/dL (ref 8.4–10.5)
CO2: 22 mEq/L (ref 19–32)
Chloride: 109 mEq/L (ref 96–112)
Creatinine, Ser: 0.6 mg/dL (ref 0.50–1.10)
GFR calc non Af Amer: >90 mL/min (ref >90)
Glucose, Bld: 87 mg/dL (ref 70–99)
POTASSIUM: 3.8 meq/L (ref 3.7–5.3)
Sodium: 142 mEq/L (ref 137–147)

## 2013-08-31 LAB — PRO B NATRIURETIC PEPTIDE: Pro B Natriuretic peptide (BNP): 874.7 pg/mL — ABNORMAL HIGH (ref 0–125)

## 2013-08-31 MED ORDER — IPRATROPIUM-ALBUTEROL 0.5-2.5 (3) MG/3ML IN SOLN
3.0000 mL | RESPIRATORY_TRACT | Status: DC
  Administered 2013-08-31 – 2013-09-01 (×10): 3 mL via RESPIRATORY_TRACT
  Filled 2013-08-31 (×10): qty 3

## 2013-08-31 MED ORDER — VANCOMYCIN 50 MG/ML ORAL SOLUTION: 125.0000 mg | ORAL | Status: DC

## 2013-08-31 MED ORDER — VANCOMYCIN 50 MG/ML ORAL SOLUTION
250.0000 mg | Freq: Four times a day (QID) | ORAL | Status: AC
  Administered 2013-08-31 – 2013-09-04 (×13): 250 mg via ORAL
  Filled 2013-08-31 (×14): qty 5

## 2013-08-31 MED ORDER — SODIUM CHLORIDE 0.9 % IV SOLN: INTRAVENOUS | Status: DC

## 2013-08-31 MED ORDER — VANCOMYCIN 50 MG/ML ORAL SOLUTION: 125.0000 mg | Freq: Every day | ORAL | Status: DC

## 2013-08-31 MED ORDER — VANCOMYCIN 50 MG/ML ORAL SOLUTION
125.0000 mg | Freq: Two times a day (BID) | ORAL | Status: DC
  Administered 2013-09-04 – 2013-09-09 (×10): 125 mg via ORAL
  Filled 2013-08-31 (×13): qty 2.5

## 2013-08-31 NOTE — Plan of Care (Signed)
Problem: Phase I Progression Outcomes Goal: Tolerating diet Outcome: Progressing Eating 1/2 to 3/4ths of tray

## 2013-08-31 NOTE — Progress Notes (Signed)
[facility] PROGRESS NOTE  Amanda Armstrong VHQ:[id number] DOB: 12/10/1945 DOA: 08/27/2013 PCP: Kirk RuthsMCGOUGH,WILLIAM M, MD  Assessment/Plan: 1. Profound dehydration. Likely secondary to GI loss from multiple episodes of diarrhea. Resolving.  2. Hypovolemic shock, requiring IV pressors initially, now resolved as IV pressors were weaned off on 08/28/13.  Patient having a two-month history of diarrhea secondary to C.diff. Resolved.  3. Sepsis, present on admission, evidenced by hypotension that required pressor support, respiratory rate of 27, HR of 121. Source of infection likely to be from C diff colitis.    4. C. difficile colitis. Patient presenting with multiple episodes of diarrhea, stool for Cdiff was positive. IV Zosyn and vancomycin were stopped on 08/28/13, as Oral Vanc was started on 08/28/13. She continues to have liquid consistency bowel movements. Will continue to provide supportive care.  5. Wheezing. She had increased wheezes on lung exam today. I am concerned for the possibility of fluid overload as she receive aggressive IV fluids for hypovolemia. Will check a CXR and BNP. Stop IV fluids for now.    6. Hypokalemia. Resolved.  7. Hypothyroidism. TSH within normal limits at 0.696. Continue thyroid replacement therapy. 8. Deconditioning. Patient deconditioned, continues to have generalized weakness, physical therapy consulted. May benefit from SNF.   9. DVT prophylaxis. Heparin subcutaneous   Code Status: Full code Disposition Plan: PT consult placed, [facility]    Consultants:  Pulmonary critical care medicine   Antibiotics:  Vancomycin (discontinued on 08/28/2013)  Zosyn (discontinued on 08/28/2013)  Oral Vancomycin (started on 08/28/13)  HPI/Subjective: Patient is a pleasant 68 year old female with a past medical history of C. difficile colitis, admitted to the medicine service on 08/27/2013, presenting with generalized weakness,  undergoing dehydration, recurrent falls at home. Symptoms attributed to profound dehydration likely secondary to GI loss from long-standing diarrhea, found to be hypotensive, requiring step down unit admission and IV pressors. Patient's blood pressures improved and this morning, Neo-Synephrine been weaned down.  She reports having 4 liquid consistency bowel movements overnight, feels weak today.   Objective: Filed Vitals:   08/31/13 0715  BP:   Pulse: 70  Temp:   Resp:     Intake/Output Summary (Last 24 hours) at 08/31/13 1225 Last data filed at 08/31/13 0900  Gross per 24 hour  Intake 1206.67 ml  Output      0 ml  Net 1206.67 ml   Filed Weights   08/27/13 1434 08/27/13 1439 08/29/13 1223  Weight: 69.8 kg (153 lb 14.1 oz) 69.8 kg (153 lb 14.1 oz) 74.889 kg (165 lb 1.6 oz)    Exam:   General:  Patient is in no acute distress awake alert, states feeling weak  Cardiovascular: Regular rate rhythm normal S1-S2  Respiratory: Normal respiratory effort, lungs are clear to auscultation bilaterally  Abdomen: Patient having a generalized abdominal pain to palpation, positive bowel, no peritoneal signs noted  Musculoskeletal: Continues to have some pain to bilateral legs, trace edema to lower extremities.   Data Reviewed: Basic Metabolic Panel:  Recent Labs Lab 08/27/13 1110 08/28/13 0339 08/30/13 0500 08/31/13 0442  NA 138 141 143 142  K 3.5* 3.0* 3.1* 3.8  CL 98 110 111 109  CO2 21 20 20 22   GLUCOSE 137* 130* 80 87  BUN 12 11 4* 5*  CREATININE 0.78 0.65 0.58 0.60  CALCIUM 8.6 6.6* 7.5* 8.6   Liver Function Tests:  Recent Labs Lab 08/27/13 1110 08/28/13 0339  AST 15 31  ALT  14 13  ALKPHOS 60 40  BILITOT 0.9 1.0  PROT 7.5 5.4*  ALBUMIN 4.1 2.6*   No results found for this basename: LIPASE, AMYLASE,  in the last 168 hours No results found for this basename: AMMONIA,  in the last 168 hours CBC:  Recent Labs Lab 08/27/13 1110 08/28/13 0339 08/30/13 0500  08/31/13 0442  WBC 10.5 10.9* 7.4 7.7  NEUTROABS 8.0*  --   --   --   HGB 13.5 11.1* 10.4* 11.1*  HCT 41.8 34.9* 32.4* 34.0*  MCV 90.5 90.2 88.8 87.9  PLT 254 215 213 249   Cardiac Enzymes:  Recent Labs Lab 08/27/13 1110  TROPONINI <0.30   BNP (last 3 results)  Recent Labs  05/28/13 0545 08/27/13 1110  PROBNP 1179.0* 208.9*   CBG: No results found for this basename: GLUCAP,  in the last 168 hours  Recent Results (from the past 240 hour(s))  CULTURE, BLOOD (ROUTINE X 2)     Status: None   Collection Time    08/27/13 11:10 AM      Result Value Ref Range Status   Specimen Description BLOOD RIGHT ARM   Final   Special Requests BOTTLES DRAWN AEROBIC AND ANAEROBIC   Final   Culture  Setup Time     Final   Value: 08/27/2013 14:17     Performed at [facility]   Culture     Final   Value:        BLOOD CULTURE RECEIVED NO GROWTH TO DATE CULTURE WILL BE HELD FOR 5 DAYS BEFORE ISSUING A FINAL NEGATIVE REPORT     Performed at [facility]   Report Status PENDING   Incomplete  URINE CULTURE     Status: None   Collection Time    08/27/13 11:30 AM      Result Value Ref Range Status   Specimen Description URINE, CATHETERIZED   Final   Special Requests NONE   Final   Culture  Setup Time     Final   Value: 08/27/2013 14:20     Performed at [facility] Count     Final   Value: 50,000 COLONIES/ML     Performed at [facility]   Culture     Final   Value: Multiple bacterial morphotypes present, none predominant. Suggest appropriate recollection if clinically indicated.     Performed at [facility]   Report Status 08/28/2013 FINAL   Final  CULTURE, BLOOD (ROUTINE X 2)     Status: None   Collection Time    08/27/13 11:38 AM      Result Value Ref Range Status   Specimen Description BLOOD LEFT FOREARM   Final   Special Requests BOTTLES DRAWN AEROBIC AND ANAEROBIC   Final   Culture  Setup Time     Final   Value:  08/27/2013 14:17     Performed at [facility]   Culture     Final   Value:        BLOOD CULTURE RECEIVED NO GROWTH TO DATE CULTURE WILL BE HELD FOR 5 DAYS BEFORE ISSUING A FINAL NEGATIVE REPORT     Performed at [facility]   Report Status PENDING   Incomplete  MRSA PCR SCREENING     Status: None   Collection Time    08/27/13  2:50 PM      Result Value Ref Range Status   MRSA by PCR  NEGATIVE  NEGATIVE Final   Comment:            The GeneXpert MRSA Assay (FDA     approved for NASAL specimens     only), is one component of a     comprehensive MRSA colonization     surveillance program. It is not     intended to diagnose MRSA     infection nor to guide or     monitor treatment for     MRSA infections.  CLOSTRIDIUM DIFFICILE BY PCR     Status: Abnormal   Collection Time    08/27/13  8:27 PM      Result Value Ref Range Status   C difficile by pcr POSITIVE (*) NEGATIVE Final   Comment: CRITICAL RESULT CALLED TO, READ BACK BY AND VERIFIED WITH:     Aris Georgia RN 10:40 08/28/13 ([email or web address])     Performed at [facility]     Studies: No results found.  Scheduled Meds: . antiseptic oral rinse  15 mL Mouth Rinse BID  . heparin  5,000 Units Subcutaneous 3 times per day  . ipratropium-albuterol  3 mL Nebulization Q4H  . levothyroxine  75 mcg Oral QAC breakfast  . tiotropium  18 mcg Inhalation QHS  . vancomycin  125 mg Oral QID   Followed by  . [START ON 09/04/2013] vancomycin  125 mg Oral BID   Followed by  . [START ON 09/12/2013] vancomycin  125 mg Oral Daily   Followed by  . [START ON 09/19/2013] vancomycin  125 mg Oral QODAY   Followed by  . [START ON 09/27/2013] vancomycin  125 mg Oral Q3 days   Continuous Infusions:    Principal Problem:   SIRS (systemic inflammatory response syndrome) Active Problems:   Hypovolemic shock   Enteritis due to Clostridium difficile   Arthritis   Hypothyroidism   Tachycardia   Weakness    Time spent: 35  minutes    Jeralyn Bennett  [facility] Pager [phone]. If 7PM-7AM, please contact night-coverage at [email or web address], password [facility] 08/31/2013, 12:25 PM  LOS: 4 days

## 2013-08-31 NOTE — Progress Notes (Signed)
Patient with audible wheezes this am. Patient encouraged to use IS. Patient with edema right > left in legs and sacral edema. Incontinent of urine at times. sats 93% room air. Will monitor. Requesting her Spiriva but it is ordered once a day a hs.

## 2013-09-01 LAB — CBC
HCT: 32.1 % — ABNORMAL LOW (ref 36.0–46.0)
Hemoglobin: 10.4 g/dL — ABNORMAL LOW (ref 12.0–15.0)
MCH: 28.5 pg (ref 26.0–34.0)
MCHC: 32.4 g/dL (ref 30.0–36.0)
MCV: 87.9 fL (ref 78.0–100.0)
Platelets: 223 10*3/uL (ref 150–400)
RBC: 3.65 MIL/uL — ABNORMAL LOW (ref 3.87–5.11)
RDW: 16.4 % — ABNORMAL HIGH (ref 11.5–15.5)
WBC: 7.2 10*3/uL (ref 4.0–10.5)

## 2013-09-01 LAB — BASIC METABOLIC PANEL
BUN: 6 mg/dL (ref 6–23)
CALCIUM: 8.6 mg/dL (ref 8.4–10.5)
CO2: 24 mEq/L (ref 19–32)
Chloride: 105 mEq/L (ref 96–112)
Creatinine, Ser: 0.62 mg/dL (ref 0.50–1.10)
GFR calc non Af Amer: 90 mL/min — ABNORMAL LOW (ref >90)
GLUCOSE: 79 mg/dL (ref 70–99)
Potassium: 3.4 mEq/L — ABNORMAL LOW (ref 3.7–5.3)
SODIUM: 140 meq/L (ref 137–147)

## 2013-09-01 MED ORDER — POTASSIUM CHLORIDE CRYS ER 20 MEQ PO TBCR
40.0000 meq | EXTENDED_RELEASE_TABLET | Freq: Once | ORAL | Status: AC
  Administered 2013-09-01: 40 meq via ORAL
  Filled 2013-09-01: qty 2

## 2013-09-01 MED ORDER — ALBUTEROL SULFATE (2.5 MG/3ML) 0.083% IN NEBU
2.5000 mg | INHALATION_SOLUTION | Freq: Two times a day (BID) | RESPIRATORY_TRACT | Status: DC
  Administered 2013-09-02 (×2): 2.5 mg via RESPIRATORY_TRACT
  Filled 2013-09-01 (×2): qty 3

## 2013-09-01 NOTE — Progress Notes (Signed)
[facility] PROGRESS NOTE  BRISA AUTH NFA:[id number] DOB: [birth year] DOA: 08/27/2013 PCP: [name], MD  Assessment/Plan: 1. Profound dehydration. Likely secondary to GI loss from multiple episodes of diarrhea. Resolving.  2. Hypovolemic shock, requiring IV pressors initially, now resolved as IV pressors were weaned off on 08/28/13.  Patient having a two-month history of diarrhea secondary to C.diff. Resolved.  3. Sepsis, present on admission, evidenced by hypotension that required pressor support, respiratory rate of 27, HR of 121. Source of infection likely to be from C diff colitis.    4. C. difficile colitis. Patient presenting with multiple episodes of diarrhea, stool for Cdiff was positive. IV Zosyn and vancomycin were stopped on 08/28/13, as Oral Vanc was started on 08/28/13. Patient having multiple liquid consistency bowel movements, although white count remains within normal limits, and she is hemodynamixcally stable. Will increase oral vancomycin does to 250 mg four times daily. .  5. Wheezing. I think likely secondary to fluid overload after receiving aggressive IV fluid resuscitation initially. CXR showed vascular congestion   6. Hypokalemia. Resolved.  7. Hypothyroidism. TSH within normal limits at 0.696. Continue thyroid replacement therapy. 8. Deconditioning. Patient deconditioned, continues to have generalized weakness, physical therapy consulted. May benefit from SNF.   9. DVT prophylaxis. Heparin subcutaneous   Code Status: Full code Disposition Plan: PT consult placed, she may benefit from short term rehab at [facility].    Consultants:  Pulmonary critical care medicine   Antibiotics:  Vancomycin (discontinued on 08/28/2013)  Zosyn (discontinued on 08/28/2013)  Oral Vancomycin (started on 08/28/13)  HPI/Subjective: Patient is a pleasant 68 year old female with a past medical history of C. difficile colitis, admitted to the medicine service on 08/27/2013,  presenting with generalized weakness, undergoing dehydration, recurrent falls at home. Symptoms attributed to profound dehydration likely secondary to GI loss from long-standing diarrhea, found to be hypotensive, requiring step down unit admission and IV pressors. Patient's blood pressures improved and this morning, Neo-Synephrine been weaned down.  She feels that abdominal cramps may be improving, tolerating PO intake.   Objective: Filed Vitals:   09/01/13 1330  BP: 135/69  Pulse: 95  Temp: 98.1 F (36.7 C)  Resp: 16    Intake/Output Summary (Last 24 hours) at 09/01/13 1556 Last data filed at 09/01/13 1500  Gross per 24 hour  Intake   1720 ml  Output      0 ml  Net   1720 ml   Filed Weights   08/27/13 1434 08/27/13 1439 08/29/13 1223  Weight: 69.8 kg (153 lb 14.1 oz) 69.8 kg (153 lb 14.1 oz) 74.889 kg (165 lb 1.6 oz)    Exam:   General:  Patient is in no acute distress awake alert, states feeling weak  Cardiovascular: Regular rate rhythm normal S1-S2  Respiratory: Normal respiratory effort, lungs are clear to auscultation bilaterally  Abdomen: Patient having a generalized abdominal pain to palpation, positive bowel, no peritoneal signs noted  Musculoskeletal: Continues to have some pain to bilateral legs, trace edema to lower extremities.   Data Reviewed: Basic Metabolic Panel:  Recent Labs Lab 08/27/13 1110 08/28/13 0339 08/30/13 0500 08/31/13 0442 09/01/13 0407  NA 138 141 143 142 140  K 3.5* 3.0* 3.1* 3.8 3.4*  CL 98 110 111 109 105  CO2 21 20 20 22 24   GLUCOSE 137* 130* 80 87 79  BUN 12 11 4* 5* 6  CREATININE 0.78 0.65 0.58 0.60 0.62  CALCIUM 8.6 6.6* 7.5* 8.6 8.6   Liver Function Tests:  Recent Labs Lab 08/27/13 1110 08/28/13 0339  AST 15 31  ALT 14 13  ALKPHOS 60 40  BILITOT 0.9 1.0  PROT 7.5 5.4*  ALBUMIN 4.1 2.6*   No results found for this basename: LIPASE, AMYLASE,  in the last 168 hours No results found for this basename: AMMONIA,   in the last 168 hours CBC:  Recent Labs Lab 08/27/13 1110 08/28/13 0339 08/30/13 0500 08/31/13 0442 09/01/13 0407  WBC 10.5 10.9* 7.4 7.7 7.2  NEUTROABS 8.0*  --   --   --   --   HGB 13.5 11.1* 10.4* 11.1* 10.4*  HCT 41.8 34.9* 32.4* 34.0* 32.1*  MCV 90.5 90.2 88.8 87.9 87.9  PLT 254 215 213 249 223   Cardiac Enzymes:  Recent Labs Lab 08/27/13 1110  TROPONINI <0.30   BNP (last 3 results)  Recent Labs  05/28/13 0545 08/27/13 1110 08/31/13 1347  PROBNP 1179.0* 208.9* 874.7*   CBG: No results found for this basename: GLUCAP,  in the last 168 hours  Recent Results (from the past 240 hour(s))  CULTURE, BLOOD (ROUTINE X 2)     Status: None   Collection Time    08/27/13 11:10 AM      Result Value Ref Range Status   Specimen Description BLOOD RIGHT ARM   Final   Special Requests BOTTLES DRAWN AEROBIC AND ANAEROBIC 5ML   Final   Culture  Setup Time     Final   Value: 08/27/2013 14:17     Performed at [facility]   Culture     Final   Value:        BLOOD CULTURE RECEIVED NO GROWTH TO DATE CULTURE WILL BE HELD FOR 5 DAYS BEFORE ISSUING A FINAL NEGATIVE REPORT     Performed at [facility]   Report Status PENDING   Incomplete  URINE CULTURE     Status: None   Collection Time    08/27/13 11:30 AM      Result Value Ref Range Status   Specimen Description URINE, CATHETERIZED   Final   Special Requests NONE   Final   Culture  Setup Time     Final   Value: 08/27/2013 14:20     Performed at [facility]   Colony Count     Final   Value: 50,000 COLONIES/ML     Performed at [facility]   Culture     Final   Value: Multiple bacterial morphotypes present, none predominant. Suggest appropriate recollection if clinically indicated.     Performed at [facility]   Report Status 08/28/2013 FINAL   Final  CULTURE, BLOOD (ROUTINE X 2)     Status: None   Collection Time    08/27/13 11:38 AM      Result Value Ref Range Status    Specimen Description BLOOD LEFT FOREARM   Final   Special Requests BOTTLES DRAWN AEROBIC AND ANAEROBIC 5ML   Final   Culture  Setup Time     Final   Value: 08/27/2013 14:17     Performed at [facility]   Culture     Final   Value:        BLOOD CULTURE RECEIVED NO GROWTH TO DATE CULTURE WILL BE HELD FOR 5 DAYS BEFORE ISSUING A FINAL NEGATIVE REPORT     Performed at [facility]   Report Status PENDING   Incomplete  MRSA PCR SCREENING     Status: None  Collection Time    08/27/13  2:50 PM      Result Value Ref Range Status   MRSA by PCR NEGATIVE  NEGATIVE Final   Comment:            The GeneXpert MRSA Assay (FDA     approved for NASAL specimens     only), is one component of a     comprehensive MRSA colonization     surveillance program. It is not     intended to diagnose MRSA     infection nor to guide or     monitor treatment for     MRSA infections.  CLOSTRIDIUM DIFFICILE BY PCR     Status: Abnormal   Collection Time    08/27/13  8:27 PM      Result Value Ref Range Status   C difficile by pcr POSITIVE (*) NEGATIVE Final   Comment: CRITICAL RESULT CALLED TO, READ BACK BY AND VERIFIED WITH:     Aris Georgia RN 10:40 08/28/13 ([email or web address])     Performed at [facility]     Studies: Dg Chest 2 View  08/31/2013   CLINICAL DATA:  Shortness of breath  EXAM: CHEST  2 VIEW  COMPARISON:  08/27/2013  FINDINGS: The aorta is unfolded and ectatic. Heart size upper limits of normal. Central vascular congestion noted with trace pleural effusions but no overt edema. No new focal pulmonary opacity allowing for technique.  IMPRESSION: Trace effusions and center vascular congestion but overt alveolar edema.   Electronically Signed   By: Christiana Pellant M.D.   On: 08/31/2013 16:30    Scheduled Meds: . antiseptic oral rinse  15 mL Mouth Rinse BID  . heparin  5,000 Units Subcutaneous 3 times per day  . ipratropium-albuterol  3 mL Nebulization Q4H  . levothyroxine  75 mcg  Oral QAC breakfast  . tiotropium  18 mcg Inhalation QHS  . vancomycin  250 mg Oral QID   Followed by  . [START ON 09/04/2013] vancomycin  125 mg Oral BID   Followed by  . [START ON 09/12/2013] vancomycin  125 mg Oral Daily   Followed by  . [START ON 09/19/2013] vancomycin  125 mg Oral QODAY   Followed by  . [START ON 09/27/2013] vancomycin  125 mg Oral Q3 days   Continuous Infusions:    Principal Problem:   SIRS (systemic inflammatory response syndrome) Active Problems:   Hypovolemic shock   Enteritis due to Clostridium difficile   Arthritis   Hypothyroidism   Tachycardia   Weakness    Time spent: 35 minutes    Jeralyn Bennett  [facility] Pager [phone]. If 7PM-7AM, please contact night-coverage at [email or web address], password [facility] 09/01/2013, 3:56 PM  LOS: 5 days

## 2013-09-01 NOTE — Progress Notes (Signed)
Procedure for placement of flexiseal explained. Teach back done. Flexiseal placed . Patient tolerated well.

## 2013-09-01 NOTE — Progress Notes (Signed)
Gave Pt bed offers.  Pt happy to hear that [facility] has offered her a bed.  She will accept this offer.  Weekday CSW to follow.  Providence CrosbyAmanda Rena Sweeden, LCSWA Clinical Social Work [phone]

## 2013-09-02 ENCOUNTER — Inpatient Hospital Stay (HOSPITAL_COMMUNITY): Payer: Medicare HMO

## 2013-09-02 LAB — CULTURE, BLOOD (ROUTINE X 2)
CULTURE: NO GROWTH
Culture: NO GROWTH

## 2013-09-02 LAB — BASIC METABOLIC PANEL
BUN: 3 mg/dL — ABNORMAL LOW (ref 6–23)
CHLORIDE: 104 meq/L (ref 96–112)
CO2: 25 mEq/L (ref 19–32)
Calcium: 8.7 mg/dL (ref 8.4–10.5)
Creatinine, Ser: 0.64 mg/dL (ref 0.50–1.10)
GFR calc non Af Amer: 89 mL/min — ABNORMAL LOW (ref >90)
Glucose, Bld: 96 mg/dL (ref 70–99)
POTASSIUM: 3.5 meq/L — AB (ref 3.7–5.3)
Sodium: 141 mEq/L (ref 137–147)

## 2013-09-02 LAB — CBC
HCT: 32.6 % — ABNORMAL LOW (ref 36.0–46.0)
Hemoglobin: 10.9 g/dL — ABNORMAL LOW (ref 12.0–15.0)
MCH: 28.9 pg (ref 26.0–34.0)
MCHC: 33.4 g/dL (ref 30.0–36.0)
MCV: 86.5 fL (ref 78.0–100.0)
Platelets: 267 10*3/uL (ref 150–400)
RBC: 3.77 MIL/uL — ABNORMAL LOW (ref 3.87–5.11)
RDW: 16.2 % — ABNORMAL HIGH (ref 11.5–15.5)
WBC: 7.2 10*3/uL (ref 4.0–10.5)

## 2013-09-02 MED ORDER — VANCOMYCIN HCL 500 MG IV SOLR
500.0000 mg | Freq: Four times a day (QID) | Status: DC
  Administered 2013-09-02 – 2013-09-09 (×29): 500 mg via RECTAL
  Filled 2013-09-02 (×35): qty 500

## 2013-09-02 MED ORDER — ALBUTEROL SULFATE (2.5 MG/3ML) 0.083% IN NEBU: 2.5000 mg | INHALATION_SOLUTION | Freq: Four times a day (QID) | RESPIRATORY_TRACT | Status: DC | PRN

## 2013-09-02 MED ORDER — METRONIDAZOLE IN NACL 5-0.79 MG/ML-% IV SOLN
500.0000 mg | Freq: Three times a day (TID) | INTRAVENOUS | Status: DC
  Administered 2013-09-02 – 2013-09-09 (×22): 500 mg via INTRAVENOUS
  Filled 2013-09-02 (×23): qty 100

## 2013-09-02 NOTE — Progress Notes (Signed)
CSW continuing to follow for disposition planning.  CSW met with pt at bedside and confirmed that pt chooses bed at [facility] placement.  CSW contacted [facility] to notify of pt acceptance of bed offer and [facility] [place] planned to initiate insurance authorization as this is required prior to pt d/c to SNF.  CSW spoke with MD who reports that pt not yet medically ready for discharge.  CSW updated [facility] [place].  CSW to continue to follow and assist with pt discharge to [facility] [place] when pt medically ready for discharge and insurance authorization received.  Alison Murray, MSW, [place] Work [phone]

## 2013-09-02 NOTE — Progress Notes (Signed)
[facility] PROGRESS NOTE  Amanda Armstrong ZOX:[id number] DOB: 1946-01-05 DOA: 08/27/2013 PCP: [name], MD  Assessment/Plan:  1. Hypovolemic shock, requiring IV pressors initially, now resolved as IV pressors were weaned off on 08/28/13.  Patient having a two-month history of diarrhea secondary to C.diff. Resolved.  2. Sepsis, present on admission, evidenced by hypotension that required pressor support, respiratory rate of 27, HR of 121. Source of infection likely to be from C diff colitis.    3. C. difficile colitis. She has a history of Cdiff colitis diagnosed Patient presenting with multiple episodes of diarrhea, stool for Cdiff was positive. IV Zosyn and vancomycin were stopped on 08/28/13, as Oral Vanc was started on 08/28/13. Patient having multiple liquid consistency bowel movements, although white count remains within normal limits, and she is hemodynamixcally stable.Given the lack of significant improvement,  I spoke with Dr Ninetta Lights of infectious disease who recommended adding IV Flagyl and Vancomycin enema.  4. Wheezing. Improved.  5. Hypokalemia. Resolved.  6. Hypothyroidism. TSH within normal limits at 0.696. Continue thyroid replacement therapy. 7. Deconditioning. Patient deconditioned, continues to have generalized weakness, physical therapy consulted. May benefit from SNF.   8. DVT prophylaxis. Heparin subcutaneous   Code Status: Full code Disposition Plan: PT consult placed, she may benefit from short term rehab at [facility].    Consultants:  Pulmonary critical care medicine   Antibiotics:  Vancomycin (discontinued on 08/28/2013)  Zosyn (discontinued on 08/28/2013)  Oral Vancomycin (started on 08/28/13)  HPI/Subjective: Patient is a pleasant 68 year old female with a past medical history of C. difficile colitis, admitted to the medicine service on 08/27/2013, presenting with generalized weakness, undergoing dehydration, recurrent falls at home. Symptoms  attributed to profound dehydration likely secondary to GI loss from long-standing diarrhea, found to be hypotensive, requiring step down unit admission and IV pressors. Patient's blood pressures improved and this morning, Neo-Synephrine been weaned down.  Patient having ongoing liquid consistency diarrhea, minimal improvement over the weekend despite changing to Vancomycin, then increasing vancomycin dose. I spoke with Dr Ninetta Lights of ID on 09/02/13, recommended added Vanc enema and IV flagyl.   Objective: Filed Vitals:   09/02/13 0502  BP: 132/61  Pulse: 81  Temp: 98.1 F (36.7 C)  Resp: 18    Intake/Output Summary (Last 24 hours) at 09/02/13 1314 Last data filed at 09/02/13 0700  Gross per 24 hour  Intake    780 ml  Output   2350 ml  Net  -1570 ml   Filed Weights   08/27/13 1434 08/27/13 1439 08/29/13 1223  Weight: 69.8 kg (153 lb 14.1 oz) 69.8 kg (153 lb 14.1 oz) 74.889 kg (165 lb 1.6 oz)    Exam:   General:  Patient is in no acute distress awake alert, states feeling weak  Cardiovascular: Regular rate rhythm normal S1-S2  Respiratory: Normal respiratory effort, lungs are clear to auscultation bilaterally  Abdomen: Patient having a generalized abdominal pain to palpation, positive bowel, no peritoneal signs noted  Musculoskeletal: Continues to have some pain to bilateral legs, trace edema to lower extremities.   Data Reviewed: Basic Metabolic Panel:  Recent Labs Lab 08/28/13 0339 08/30/13 0500 08/31/13 0442 09/01/13 0407 09/02/13 0349  NA 141 143 142 140 141  K 3.0* 3.1* 3.8 3.4* 3.5*  CL 110 111 109 105 104  CO2 20 20 22 24 25   GLUCOSE 130* 80 87 79 96  BUN 11 4* 5* 6 3*  CREATININE 0.65 0.58 0.60 0.62 0.64  CALCIUM 6.6* 7.5* 8.6  8.6 8.7   Liver Function Tests:  Recent Labs Lab 08/27/13 1110 08/28/13 0339  AST 15 31  ALT 14 13  ALKPHOS 60 40  BILITOT 0.9 1.0  PROT 7.5 5.4*  ALBUMIN 4.1 2.6*   No results found for this basename: LIPASE, AMYLASE,   in the last 168 hours No results found for this basename: AMMONIA,  in the last 168 hours CBC:  Recent Labs Lab 08/27/13 1110 08/28/13 0339 08/30/13 0500 08/31/13 0442 09/01/13 0407 09/02/13 0349  WBC 10.5 10.9* 7.4 7.7 7.2 7.2  NEUTROABS 8.0*  --   --   --   --   --   HGB 13.5 11.1* 10.4* 11.1* 10.4* 10.9*  HCT 41.8 34.9* 32.4* 34.0* 32.1* 32.6*  MCV 90.5 90.2 88.8 87.9 87.9 86.5  PLT 254 215 213 249 223 267   Cardiac Enzymes:  Recent Labs Lab 08/27/13 1110  TROPONINI <0.30   BNP (last 3 results)  Recent Labs  05/28/13 0545 08/27/13 1110 08/31/13 1347  PROBNP 1179.0* 208.9* 874.7*   CBG: No results found for this basename: GLUCAP,  in the last 168 hours  Recent Results (from the past 240 hour(s))  CULTURE, BLOOD (ROUTINE X 2)     Status: None   Collection Time    08/27/13 11:10 AM      Result Value Ref Range Status   Specimen Description BLOOD RIGHT ARM   Final   Special Requests BOTTLES DRAWN AEROBIC AND ANAEROBIC 5ML   Final   Culture  Setup Time     Final   Value: 08/27/2013 14:17     Performed at [facility]   Culture     Final   Value: NO GROWTH 5 DAYS     Performed at [facility]   Report Status 09/02/2013 FINAL   Final  URINE CULTURE     Status: None   Collection Time    08/27/13 11:30 AM      Result Value Ref Range Status   Specimen Description URINE, CATHETERIZED   Final   Special Requests NONE   Final   Culture  Setup Time     Final   Value: 08/27/2013 14:20     Performed at [facility]   Colony Count     Final   Value: 50,000 COLONIES/ML     Performed at [facility]   Culture     Final   Value: Multiple bacterial morphotypes present, none predominant. Suggest appropriate recollection if clinically indicated.     Performed at [facility]   Report Status 08/28/2013 FINAL   Final  CULTURE, BLOOD (ROUTINE X 2)     Status: None   Collection Time    08/27/13 11:38 AM      Result Value Ref Range  Status   Specimen Description BLOOD LEFT FOREARM   Final   Special Requests BOTTLES DRAWN AEROBIC AND ANAEROBIC 5ML   Final   Culture  Setup Time     Final   Value: 08/27/2013 14:17     Performed at [facility]   Culture     Final   Value: NO GROWTH 5 DAYS     Performed at [facility]   Report Status 09/02/2013 FINAL   Final  MRSA PCR SCREENING     Status: None   Collection Time    08/27/13  2:50 PM      Result Value Ref Range Status   MRSA by PCR  NEGATIVE  NEGATIVE Final   Comment:            The GeneXpert MRSA Assay (FDA     approved for NASAL specimens     only), is one component of a     comprehensive MRSA colonization     surveillance program. It is not     intended to diagnose MRSA     infection nor to guide or     monitor treatment for     MRSA infections.  CLOSTRIDIUM DIFFICILE BY PCR     Status: Abnormal   Collection Time    08/27/13  8:27 PM      Result Value Ref Range Status   C difficile by pcr POSITIVE (*) NEGATIVE Final   Comment: CRITICAL RESULT CALLED TO, READ BACK BY AND VERIFIED WITH:     Aris Georgia RN 10:40 08/28/13 ([email or web address])     Performed at [facility]     Studies: Dg Chest 2 View  08/31/2013   CLINICAL DATA:  Shortness of breath  EXAM: CHEST  2 VIEW  COMPARISON:  08/27/2013  FINDINGS: The aorta is unfolded and ectatic. Heart size upper limits of normal. Central vascular congestion noted with trace pleural effusions but no overt edema. No new focal pulmonary opacity allowing for technique.  IMPRESSION: Trace effusions and center vascular congestion but overt alveolar edema.   Electronically Signed   By: Christiana Pellant M.D.   On: 08/31/2013 16:30   Dg Knee 1-2 Views Right  09/02/2013   CLINICAL DATA:  Postoperative pain  EXAM: RIGHT KNEE - 1-2 VIEW  COMPARISON:  None.  FINDINGS: Surgical changes of a prior total knee arthroplasty. No evidence of hardware complication. Alignment appears anatomic. No significant suprapatellar  joint effusion. There is some irregularity of the soft tissue in the lateral aspect of the distal thigh.  IMPRESSION: Total knee arthroplasty without evidence of hardware complication or malalignment.  Irregularity of the soft tissues in the lateral aspect of the distal thigh. Query clinical signs of hematoma, cellulitis or edema?   Electronically Signed   By: Malachy Moan M.D.   On: 09/02/2013 09:23    Scheduled Meds: . albuterol  2.5 mg Nebulization BID  . antiseptic oral rinse  15 mL Mouth Rinse BID  . heparin  5,000 Units Subcutaneous 3 times per day  . levothyroxine  75 mcg Oral QAC breakfast  . tiotropium  18 mcg Inhalation QHS  . vancomycin  250 mg Oral QID   Followed by  . [START ON 09/04/2013] vancomycin  125 mg Oral BID   Followed by  . [START ON 09/12/2013] vancomycin  125 mg Oral Daily   Followed by  . [START ON 09/19/2013] vancomycin  125 mg Oral QODAY   Followed by  . [START ON 09/27/2013] vancomycin  125 mg Oral Q3 days   Continuous Infusions:    Principal Problem:   SIRS (systemic inflammatory response syndrome) Active Problems:   Hypovolemic shock   Enteritis due to Clostridium difficile   Arthritis   Hypothyroidism   Tachycardia   Weakness    Time spent: 35 minutes    Jeralyn Bennett  [facility] Pager [phone]. If 7PM-7AM, please contact night-coverage at [email or web address], password [facility] 09/02/2013, 1:14 PM  LOS: 6 days

## 2013-09-02 NOTE — Progress Notes (Signed)
Physical Therapy Treatment Patient Details Name: Amanda SailorsRebekah P Rutledge MRN: [id number] DOB: [birth year] Today's Date: 09/02/2013 Time: [id number] PT Time Calculation (min): 26 min  PT Assessment / Plan / Recommendation  History of Present Illness 68 yo female admitted with SIRS, dehydration, hypotension. Hx of c diff, falls, vertigo, gait d/o, anxiety.    PT Comments   Not progressing this session. Pt noted to be leaning posteriorly with sitting and static standing-pt fatigues easily and is fearful of falling. Pt appears to have impaired positional awareness this session. Reports she hasn't been OOB since last PT visit. Pt also declined sitting in recliner on today. Recommend daily mobility, OOB<>chair with nursing assistance and use of walker. Continue to recommend SNF  Follow Up Recommendations  SNF     Does the patient have the potential to tolerate intense rehabilitation     Barriers to Discharge        Equipment Recommendations  None recommended by PT    Recommendations for Other Services OT consult  Frequency Min 3X/week   Progress towards PT Goals Progress towards PT goals: Not progressing toward goals - comment (uanble to ambulate this session due to fatigue, fear of falling (impaired positional awareness))  Plan Current plan remains appropriate    Precautions / Restrictions Precautions Precautions: Fall Precaution Comments: C diff precautions; flexiseal Restrictions Weight Bearing Restrictions: No   Pertinent Vitals/Pain No c/o pain    Mobility  Bed Mobility Overal bed mobility: Needs Assistance Bed Mobility: Supine to Sit;Sit to Supine Supine to sit: Min assist Sit to supine: Min assist General bed mobility comments: Assist for LEs and for management of lines (foley, flexiseal). Utilized bedpad to aid with scooting, positioning.  Transfers Overall transfer level: Needs assistance Transfers: Sit to/from Stand Sit to Stand: Min assist;From elevated surface General  transfer comment: x 3 for strengthening, activity tolerance, to work on static standing balance. VCs safety, technique, and for pt to work on shifting weight anteriorly. Assist to rise, stabilize, control descent. Only able to stand for ~20-30 seconds before sitting abruptly.  Ambulation/Gait Ambulation/Gait assistance: Min assist Assistive device: Rolling walker (2 wheeled) General Gait Details: Pt only able to take 3-4 side steps to [facility] with walker this session. Pt noted to be leaning posteriorly. Fatigues easily.     Exercises General Exercises - Lower Extremity Ankle Circles/Pumps: Both;10 reps;Supine Quad Sets: AROM;Both;10 reps;Supine Heel Slides: AROM;Both;10 reps;AAROM;Supine Hip ABduction/ADduction: AROM;AAROM;Both;10 reps;Supine   PT Diagnosis:    PT Problem List:   PT Treatment Interventions:     PT Goals (current goals can now be found in the care plan section)    Visit Information  Last PT Received On: 09/02/13 Assistance Needed: +1 History of Present Illness: 68 yo female admitted with SIRS, dehydration, hypotension. Hx of c diff, falls, vertigo, gait d/o, anxiety.     Subjective Data      Cognition  Cognition Arousal/Alertness: Awake/alert Behavior During Therapy: WFL for tasks assessed/performed Overall Cognitive Status: Within Functional Limits for tasks assessed    Balance     End of Session PT - End of Session Equipment Utilized During Treatment: Gait belt Activity Tolerance: Patient limited by fatigue Patient left: in bed;with call bell/phone within reach (pt declined sitting in recliner)   GP     Rebeca AlertJannie Wesly Whisenant, MPT Pager: [phone]

## 2013-09-03 DIAGNOSIS — R5381 Other malaise: Secondary | ICD-10-CM

## 2013-09-03 LAB — CBC
HEMATOCRIT: 32.7 % — AB (ref 36.0–46.0)
HEMOGLOBIN: 10.9 g/dL — AB (ref 12.0–15.0)
MCH: 29 pg (ref 26.0–34.0)
MCHC: 33.3 g/dL (ref 30.0–36.0)
MCV: 87 fL (ref 78.0–100.0)
Platelets: 276 10*3/uL (ref 150–400)
RBC: 3.76 MIL/uL — ABNORMAL LOW (ref 3.87–5.11)
RDW: 16.1 % — ABNORMAL HIGH (ref 11.5–15.5)
WBC: 7.3 10*3/uL (ref 4.0–10.5)

## 2013-09-03 LAB — BASIC METABOLIC PANEL
BUN: 3 mg/dL — AB (ref 6–23)
CO2: 26 mEq/L (ref 19–32)
Calcium: 7.9 mg/dL — ABNORMAL LOW (ref 8.4–10.5)
Chloride: 102 mEq/L (ref 96–112)
Creatinine, Ser: 0.61 mg/dL (ref 0.50–1.10)
GFR calc Af Amer: >90 mL/min (ref >90)
GFR calc non Af Amer: >90 mL/min (ref >90)
GLUCOSE: 94 mg/dL (ref 70–99)
POTASSIUM: 3.2 meq/L — AB (ref 3.7–5.3)
Sodium: 137 mEq/L (ref 137–147)

## 2013-09-03 MED ORDER — POTASSIUM CHLORIDE CRYS ER 20 MEQ PO TBCR
40.0000 meq | EXTENDED_RELEASE_TABLET | Freq: Four times a day (QID) | ORAL | Status: AC
  Administered 2013-09-03 (×2): 40 meq via ORAL
  Filled 2013-09-03 (×2): qty 2

## 2013-09-03 NOTE — Progress Notes (Signed)
CSW continuing to follow for disposition needs to [facility] [place].  CSW spoke with MD who reports that pt not yet medically ready for discharge.  CSW updated [facility] [place] and sent updated clinical information as facility is seeking insurance authorization from [employer].  CSW to continue to follow and facilitate pt discharge needs when pt medically ready for discharge and insurance authorization received.  Amanda Armstrong, MSW, LCSW Clinical Social Work [phone]

## 2013-09-03 NOTE — Progress Notes (Signed)
[facility] PROGRESS NOTE  Amanda Armstrong ZOX:[id number] DOB: 1945-10-05 DOA: 08/27/2013 PCP: [name], MD  Interim Summary Patient is a pleasant 68 year old female with a past medical history of C. difficile colitis diagnosed on recent hospitalization on 08/03/2013, who was admitted to the medicine service on 08/27/2013. She presented with complaints of generalized weakness , fatigue, having a fall at home. She is found to be hypotensive, tachycardic, profoundly dehydrated. she was admitted to the ICU and started on IV pressors. she was also started on broad-spectrum IV antibiotic therapy with vancomycin and Zosyn as well as IV Flagyl given history of C. difficile colitis. She was administered IV fluids and by the following day her Neo-Synephrine was weaned off. Given persistent watery diarrhea a C. difficile was suspected as vancomycin and Zosyn were discontinued on 08/28/13. C. difficile came back positive as she was treated with oral vancomycin given at this likely represented a recurrence. given clinical improvement she was transferred out of the intensive care unit to MedSurg floor on 08/29/2013. Initially it seemed that her diarrhea has markedly improved however on the morning of 08/31/2013 she reported having multiple liquid consistency bowel movements the night prior.  Initially I had increased her vancomycin dose to 250 mg 4 times daily however given the persistence of diarrhea I discussed case with Dr. Ninetta Lights of infectious disease on 09/02/2012. He recommending adding IV Flagyl and vancomycin enema 4 times daily. This was started on the evening of 09/02/2013. This morning she complains of ongoing liquid consistency diarrhea and generalized weakness. She has been accepted to skilled nursing facility once medically stable.                                                                                                                                                                                                                                                              Assessment/Plan:  1. Hypovolemic shock, requiring IV pressors initially, now resolved as IV pressors were weaned off on 08/28/13. 2. Sepsis, present on admission, evidenced by hypotension that required pressor support, respiratory rate of 27, HR of 121. Source of infection likely to be from C diff colitis.    3. C. difficile colitis. She has a history of Cdiff colitis diagnosed Patient presenting with multiple episodes of diarrhea, stool for  Cdiff was positive. IV Zosyn and vancomycin were stopped on 08/28/13, as Oral Vanc was started on 08/28/13. Patient having multiple liquid consistency bowel movements, although white count remains within normal limits, and she is hemodynamixcally stable. Given the lack of significant improvement,  I spoke with Dr Ninetta Lights of infectious disease who recommended adding IV Flagyl and Vancomycin enema.  Continue supportive care 4. Wheezing. Improved.  5. Hypokalemia. Resolved.  6. Hypothyroidism. TSH within normal limits at 0.696. Continue thyroid replacement therapy. 7. Deconditioning. Patient deconditioned, continues to have generalized weakness, physical therapy consulted. May benefit from SNF.   8. DVT prophylaxis. Heparin subcutaneous   Code Status: Full code Disposition Plan: Plan to discharge to skilled nursing facility when medically stable.   Consultants:  Pulmonary critical care medicine   Antibiotics:  Vancomycin (discontinued on 08/28/2013)  Zosyn (discontinued on 08/28/2013)  Oral Vancomycin (started on 08/28/13)  Vancomycin enema 4 times daily (started on 09/02/2013)  Flagyl 500 mg IV Q8 hours (started on 09/02/2013)  HPI/Subjective: Patient is a pleasant 68 year old female with a past medical history of C. difficile colitis, admitted to the medicine service on 08/27/2013, presenting with generalized weakness, undergoing dehydration, recurrent falls at  home. Symptoms attributed to profound dehydration likely secondary to GI loss from long-standing diarrhea, found to be hypotensive, requiring step down unit admission and IV pressors. Patient's blood pressures improved and this morning, Neo-Synephrine been weaned down.  Patient having ongoing liquid consistency diarrhea, minimal improvement over the weekend despite changing to Vancomycin, then increasing vancomycin dose. I spoke with Dr Ninetta Lights of ID on 09/02/13, recommended added Vanc enema and IV flagyl.   Objective: Filed Vitals:   09/03/13 0446  BP: 107/48  Pulse: 88  Temp: 98.5 F (36.9 C)  Resp: 15    Intake/Output Summary (Last 24 hours) at 09/03/13 1345 Last data filed at 09/03/13 0900  Gross per 24 hour  Intake    720 ml  Output    900 ml  Net   -180 ml   Filed Weights   08/27/13 1434 08/27/13 1439 08/29/13 1223  Weight: 69.8 kg (153 lb 14.1 oz) 69.8 kg (153 lb 14.1 oz) 74.889 kg (165 lb 1.6 oz)    Exam:   General:  Patient is in no acute distress awake alert, states feeling weak  Cardiovascular: Regular rate rhythm normal S1-S2  Respiratory: Normal respiratory effort, lungs are clear to auscultation bilaterally  Abdomen: Patient having a generalized abdominal pain to palpation, positive bowel, no peritoneal signs noted  Musculoskeletal: Continues to have some pain to bilateral legs, trace edema to lower extremities.   Data Reviewed: Basic Metabolic Panel:  Recent Labs Lab 08/30/13 0500 08/31/13 0442 09/01/13 0407 09/02/13 0349 09/03/13 0330  NA 143 142 140 141 137  K 3.1* 3.8 3.4* 3.5* 3.2*  CL 111 109 105 104 102  CO2 20 22 24 25 26   GLUCOSE 80 87 79 96 94  BUN 4* 5* 6 3* 3*  CREATININE 0.58 0.60 0.62 0.64 0.61  CALCIUM 7.5* 8.6 8.6 8.7 7.9*   Liver Function Tests:  Recent Labs Lab 08/28/13 0339  AST 31  ALT 13  ALKPHOS 40  BILITOT 1.0  PROT 5.4*  ALBUMIN 2.6*   No results found for this basename: LIPASE, AMYLASE,  in the last 168  hours No results found for this basename: AMMONIA,  in the last 168 hours CBC:  Recent Labs Lab 08/30/13 0500 08/31/13 0442 09/01/13 0407 09/02/13 0349 09/03/13 0330  WBC 7.4 7.7 7.2  7.2 7.3  HGB 10.4* 11.1* 10.4* 10.9* 10.9*  HCT 32.4* 34.0* 32.1* 32.6* 32.7*  MCV 88.8 87.9 87.9 86.5 87.0  PLT 213 249 223 267 276   Cardiac Enzymes: No results found for this basename: CKTOTAL, CKMB, CKMBINDEX, TROPONINI,  in the last 168 hours BNP (last 3 results)  Recent Labs  05/28/13 0545 08/27/13 1110 08/31/13 1347  PROBNP 1179.0* 208.9* 874.7*   CBG: No results found for this basename: GLUCAP,  in the last 168 hours  Recent Results (from the past 240 hour(s))  CULTURE, BLOOD (ROUTINE X 2)     Status: None   Collection Time    08/27/13 11:10 AM      Result Value Ref Range Status   Specimen Description BLOOD RIGHT ARM   Final   Special Requests BOTTLES DRAWN AEROBIC AND ANAEROBIC   Final   Culture  Setup Time     Final   Value: 08/27/2013 14:17     Performed at [facility]   Culture     Final   Value: NO GROWTH 5 DAYS     Performed at [facility]   Report Status 09/02/2013 FINAL   Final  URINE CULTURE     Status: None   Collection Time    08/27/13 11:30 AM      Result Value Ref Range Status   Specimen Description URINE, CATHETERIZED   Final   Special Requests NONE   Final   Culture  Setup Time     Final   Value: 08/27/2013 14:20     Performed at [facility] Count     Final   Value: 50,000 COLONIES/ML     Performed at [facility]   Culture     Final   Value: Multiple bacterial morphotypes present, none predominant. Suggest appropriate recollection if clinically indicated.     Performed at [facility]   Report Status 08/28/2013 FINAL   Final  CULTURE, BLOOD (ROUTINE X 2)     Status: None   Collection Time    08/27/13 11:38 AM      Result Value Ref Range Status   Specimen Description BLOOD LEFT FOREARM   Final    Special Requests BOTTLES DRAWN AEROBIC AND ANAEROBIC   Final   Culture  Setup Time     Final   Value: 08/27/2013 14:17     Performed at [facility]   Culture     Final   Value: NO GROWTH 5 DAYS     Performed at [facility]   Report Status 09/02/2013 FINAL   Final  MRSA PCR SCREENING     Status: None   Collection Time    08/27/13  2:50 PM      Result Value Ref Range Status   MRSA by PCR NEGATIVE  NEGATIVE Final   Comment:            The GeneXpert MRSA Assay (FDA     approved for NASAL specimens     only), is one component of a     comprehensive MRSA colonization     surveillance program. It is not     intended to diagnose MRSA     infection nor to guide or     monitor treatment for     MRSA infections.  CLOSTRIDIUM DIFFICILE BY PCR     Status: Abnormal   Collection Time    08/27/13  8:27 PM  Result Value Ref Range Status   C difficile by pcr POSITIVE (*) NEGATIVE Final   Comment: CRITICAL RESULT CALLED TO, READ BACK BY AND VERIFIED WITH:     Aris GeorgiaN. RICHARDSON RN 10:40 08/28/13 ([email or web address])     [facility]     Studies: Dg Knee 1-2 Views Right  09/02/2013   CLINICAL DATA:  Postoperative pain  EXAM: RIGHT KNEE - 1-2 VIEW  COMPARISON:  None.  FINDINGS: Surgical changes of a prior total knee arthroplasty. No evidence of hardware complication. Alignment appears anatomic. No significant suprapatellar joint effusion. There is some irregularity of the soft tissue in the lateral aspect of the distal thigh.  IMPRESSION: Total knee arthroplasty without evidence of hardware complication or malalignment.  Irregularity of the soft tissues in the lateral aspect of the distal thigh. Query clinical signs of hematoma, cellulitis or edema?   Electronically Signed   By: Malachy MoanHeath  McCullough M.D.   On: 09/02/2013 09:23    Scheduled Meds: . antiseptic oral rinse  15 mL Mouth Rinse BID  . heparin  5,000 Units Subcutaneous 3 times per day  . levothyroxine  75 mcg  Oral QAC breakfast  . metronidazole  500 mg Intravenous Q8H  . potassium chloride  40 mEq Oral Q6H  . tiotropium  18 mcg Inhalation QHS  . vancomycin  250 mg Oral QID   Followed by  . [START ON 09/04/2013] vancomycin  125 mg Oral BID   Followed by  . [START ON 09/12/2013] vancomycin  125 mg Oral Daily   Followed by  . [START ON 09/19/2013] vancomycin  125 mg Oral QODAY   Followed by  . [START ON 09/27/2013] vancomycin  125 mg Oral Q3 days  . vancomycin (VANCOCIN) rectal ENEMA  500 mg Rectal Q6H   Continuous Infusions:    Principal Problem:   SIRS (systemic inflammatory response syndrome) Active Problems:   Hypovolemic shock   Enteritis due to Clostridium difficile   Arthritis   Hypothyroidism   Tachycardia   Weakness    Time spent: 35 minutes    Amanda BennettZAMORA, Jilliann Subramanian  [facility] Pager [phone]. If 7PM-7AM, please contact night-coverage at [email or web address], password [email or web address] 09/03/2013, 1:45 PM  LOS: 7 days

## 2013-09-04 MED ORDER — POTASSIUM CHLORIDE CRYS ER 20 MEQ PO TBCR
40.0000 meq | EXTENDED_RELEASE_TABLET | Freq: Two times a day (BID) | ORAL | Status: AC
  Administered 2013-09-04 – 2013-09-05 (×2): 40 meq via ORAL
  Filled 2013-09-04 (×2): qty 2

## 2013-09-04 NOTE — Progress Notes (Signed)
INITIAL NUTRITION ASSESSMENT  DOCUMENTATION CODES Per approved criteria  -Obesity Unspecified   INTERVENTION: -Encouraged yogurt and soluble fiber foods to assist with C.diff side effects -Will continue to monitor  NUTRITION DIAGNOSIS: Inadequate oral intake related to loose stools/stomach pains as evidenced by PO intake <75% for 2 weeks.   Goal: Pt to meet >/= 90% of their estimated nutrition needs    Monitor:  Total protein/energy intake, labs, weights, GI profile  Reason for Assessment: MST  68 y.o. female  Admitting Dx: SIRS (systemic inflammatory response syndrome)  ASSESSMENT: Patient is a pleasant 68 year old female with a past medical history of C. difficile colitis diagnosed on recent hospitalization on 08/03/2013, who was admitted to the medicine service on 08/27/2013. She presented with complaints of generalized weakness , fatigue, having a fall at home. She is found to be hypotensive, tachycardic, profoundly dehydrated. she was admitted to the ICU and started on IV pressors. she was also started on broad-spectrum IV antibiotic therapy with vancomycin and Zosyn as well as IV Flagyl given history of C. difficile colitis. She was administered IV fluids and by the following day her Neo-Synephrine was weaned off. Given persistent watery diarrhea a C. difficile was suspected as vancomycin and Zosyn were discontinued on 08/28/13. C. difficile came back positive as she was treated with oral vancomycin given at this likely represented a recurrence. given clinical improvement she was transferred out of the intensive care unit to MedSurg floor on 08/29/2013. Initially it seemed that her diarrhea has markedly improved however on the morning of 08/31/2013 she reported having multiple liquid consistency bowel movements the night prior  -Pt reported decreased appetite and PO intake for 2 weeks d/t chronic loose stools. -Diet recall indicated pt consumes 2 meals/day Breakfast consists  largely of starches, such as toast or bagels. Pt usually skips lunch, and will have mild foods for dinner -Pt reported usual body weight of 135 lbs. Noted to have had a 20 lbs weight gain since C.diff infection. Pt is concerned with weight gain, and wants to return to usual body weight once loose stools improves -Encouraged pt to try yogurt with meals for probiotics. Pt in agreement to add yogurt with breakfast or small lunch -Discussed the addition of bananas, applesauce, oatmeal, rice and potatoes to assist in formation of stool. -Declined supplementation at this time.   Height: Ht Readings from Last 1 Encounters:  08/29/13 4\' 10"  (1.473 m)    Weight: Wt Readings from Last 1 Encounters:  08/29/13 165 lb 1.6 oz (74.889 kg)    Ideal Body Weight: 95 lbs  % Ideal Body Weight: 174%  Wt Readings from Last 10 Encounters:  08/29/13 165 lb 1.6 oz (74.889 kg)  08/02/13 154 lb 11.2 oz (70.171 kg)  07/09/13 150 lb (68.04 kg)  05/27/13 152 lb (68.947 kg)  04/26/13 130 lb (58.968 kg)  11/08/11 126 lb (57.153 kg)  11/08/11 126 lb (57.153 kg)  11/01/11 126 lb 1.7 oz (57.2 kg)  10/25/11 133 lb (60.328 kg)    Usual Body Weight: 135 lbs  % Usual Body Weight: 122%  BMI:  Body mass index is 34.52 kg/(m^2). Obesity I  Estimated Nutritional Needs: Kcal: 1300-1500 Protein: 75-85 gram Fluid: >/= 1800 ml/daily  Skin: contact dermatitis on groin, moisture associated skin damage around buttock  Diet Order: General  EDUCATION NEEDS: -No education needs identified at this time   Intake/Output Summary (Last 24 hours) at 09/04/13 1052 Last data filed at 09/04/13 0634  Gross per 24 hour  Intake   1275 ml  Output    800 ml  Net    475 ml    Last BM: 2/18    Labs:   Recent Labs Lab 09/01/13 0407 09/02/13 0349 09/03/13 0330  NA 140 141 137  K 3.4* 3.5* 3.2*  CL 105 104 102  CO2 24 25 26   BUN 6 3* 3*  CREATININE 0.62 0.64 0.61  CALCIUM 8.6 8.7 7.9*  GLUCOSE 79 96 94     CBG (last 3)  No results found for this basename: GLUCAP,  in the last 72 hours  Scheduled Meds: . antiseptic oral rinse  15 mL Mouth Rinse BID  . heparin  5,000 Units Subcutaneous 3 times per day  . levothyroxine  75 mcg Oral QAC breakfast  . metronidazole  500 mg Intravenous Q8H  . tiotropium  18 mcg Inhalation QHS  . vancomycin  250 mg Oral QID   Followed by  . vancomycin  125 mg Oral BID   Followed by  . [START ON 09/12/2013] vancomycin  125 mg Oral Daily   Followed by  . [START ON 09/19/2013] vancomycin  125 mg Oral QODAY   Followed by  . [START ON 09/27/2013] vancomycin  125 mg Oral Q3 days  . vancomycin (VANCOCIN) rectal ENEMA  500 mg Rectal Q6H    Continuous Infusions:   Past Medical History  Diagnosis Date  . Hypothyroidism   . Anxiety   . Arthritis     Bilateral Knee DJD  . Headache(784.0)     SINUS HEADACHE OCCASSIONALLY  . Vertigo   . Multifactorial gait disorder   . Clostridium difficile carrier     pt stated that she has intermittent sx    Past Surgical History  Procedure Laterality Date  . Joint replacement  11/08/3010    right total knee  . Tubal ligation  1982  . Total knee arthroplasty  11/07/2011    Procedure: TOTAL KNEE ARTHROPLASTY;  Surgeon: Nilda Simmerobert A Wainer, MD;  [facility];  Service: Orthopedics;  Laterality: Left;  DR Anne NgWAINER WANTS 590 MINUTES FOR THIS CASE    Lloyd HugerSarah F Anjeli Casad MS RD LDN Clinical Dietitian [phone]

## 2013-09-04 NOTE — Progress Notes (Signed)
[facility] PROGRESS NOTE  Amanda Armstrong ZOX:[id number] DOB: 1946/04/29 DOA: 08/27/2013 PCP: [name], MD  Interim Summary Patient is a pleasant 68 year old female with a past medical history of C. difficile colitis diagnosed on recent hospitalization on 08/03/2013, who was admitted to the medicine service on 08/27/2013. She presented with complaints of generalized weakness , fatigue, having a fall at home. She is found to be hypotensive, tachycardic, profoundly dehydrated. she was admitted to the ICU and started on IV pressors. she was also started on broad-spectrum IV antibiotic therapy with vancomycin and Zosyn as well as IV Flagyl given history of C. difficile colitis. She was administered IV fluids and by the following day her Neo-Synephrine was weaned off. Given persistent watery diarrhea a C. difficile was suspected as vancomycin and Zosyn were discontinued on 08/28/13. C. difficile came back positive as she was treated with oral vancomycin given at this likely represented a recurrence. given clinical improvement she was transferred out of the intensive care unit to MedSurg floor on 08/29/2013. Initially it seemed that her diarrhea has markedly improved however on the morning of 08/31/2013 she reported having multiple liquid consistency bowel movements the night prior.  Initially I had increased her vancomycin dose to 250 mg 4 times daily however given the persistence of diarrhea I discussed case with Dr. Ninetta Lights of infectious disease on 09/02/2012. He recommending adding IV Flagyl and vancomycin enema 4 times daily. This was started on the evening of 09/02/2013. This morning she complains of ongoing liquid consistency diarrhea and generalized weakness. She has been accepted to skilled nursing facility once medically stable.                                                                                                                                                                                                                                                              Assessment/Plan:  1. Hypovolemic shock, requiring IV pressors initially, now resolved as IV pressors were weaned off on 08/28/13. 2. Sepsis, present on admission, evidenced by hypotension that required pressor support, respiratory rate of 27, HR of 121. Source of infection likely to be from C diff colitis.    3. C. difficile colitis. She has a history of Cdiff colitis diagnosed Patient presenting with multiple episodes of diarrhea, stool for  Cdiff was positive. IV Zosyn and vancomycin were stopped on 08/28/13, as Oral Vanc was started on 08/28/13. Patient having multiple liquid consistency bowel movements, although white count remains within normal limits, and she is hemodynamixcally stable. Given the lack of significant improvement,  I spoke with Dr Ninetta Lights of infectious disease who recommended adding IV Flagyl and Vancomycin enema.  Continue supportive care 4. Wheezing. Improved.  5. Hypokalemia. Resolved.  6. Hypothyroidism. TSH within normal limits at 0.696. Continue thyroid replacement therapy. 7. Deconditioning. Patient deconditioned, continues to have generalized weakness, physical therapy consulted. May benefit from SNF.   8. DVT prophylaxis. Heparin subcutaneous   Code Status: Full code Disposition Plan: Plan to discharge to skilled nursing facility when medically stable.   Consultants:  Pulmonary critical care medicine   Antibiotics:  Vancomycin (discontinued on 08/28/2013)  Zosyn (discontinued on 08/28/2013)  Oral Vancomycin (started on 08/28/13)  Vancomycin enema 4 times daily (started on 09/02/2013)  Flagyl 500 mg IV Q8 hours (started on 09/02/2013)  HPI/Subjective: abd cramps improved. No nausea or vomiting.   Objective: Filed Vitals:   09/04/13 1431  BP: 140/61  Pulse: 70  Temp: 98 F (36.7 C)  Resp: 16    Intake/Output Summary (Last 24 hours) at 09/04/13 1540 Last data  filed at 09/04/13 1431  Gross per 24 hour  Intake    920 ml  Output   1400 ml  Net   -480 ml   Filed Weights   08/27/13 1434 08/27/13 1439 08/29/13 1223  Weight: 69.8 kg (153 lb 14.1 oz) 69.8 kg (153 lb 14.1 oz) 74.889 kg (165 lb 1.6 oz)    Exam:   General:  Patient is in no acute distress awake alert, states feeling weak  Cardiovascular: Regular rate rhythm normal S1-S2  Respiratory: Normal respiratory effort, lungs are clear to auscultation bilaterally  Abdomen: Patient having a generalized abdominal pain to palpation, positive bowel, no peritoneal signs noted  Musculoskeletal: Continues to have some pain to bilateral legs, trace edema to lower extremities.   Data Reviewed: Basic Metabolic Panel:  Recent Labs Lab 08/30/13 0500 08/31/13 0442 09/01/13 0407 09/02/13 0349 09/03/13 0330  NA 143 142 140 141 137  K 3.1* 3.8 3.4* 3.5* 3.2*  CL 111 109 105 104 102  CO2 20 22 24 25 26   GLUCOSE 80 87 79 96 94  BUN 4* 5* 6 3* 3*  CREATININE 0.58 0.60 0.62 0.64 0.61  CALCIUM 7.5* 8.6 8.6 8.7 7.9*   Liver Function Tests: No results found for this basename: AST, ALT, ALKPHOS, BILITOT, PROT, ALBUMIN,  in the last 168 hours No results found for this basename: LIPASE, AMYLASE,  in the last 168 hours No results found for this basename: AMMONIA,  in the last 168 hours CBC:  Recent Labs Lab 08/30/13 0500 08/31/13 0442 09/01/13 0407 09/02/13 0349 09/03/13 0330  WBC 7.4 7.7 7.2 7.2 7.3  HGB 10.4* 11.1* 10.4* 10.9* 10.9*  HCT 32.4* 34.0* 32.1* 32.6* 32.7*  MCV 88.8 87.9 87.9 86.5 87.0  PLT 213 249 223 267 276   Cardiac Enzymes: No results found for this basename: CKTOTAL, CKMB, CKMBINDEX, TROPONINI,  in the last 168 hours BNP (last 3 results)  Recent Labs  05/28/13 0545 08/27/13 1110 08/31/13 1347  PROBNP 1179.0* 208.9* 874.7*   CBG: No results found for this basename: GLUCAP,  in the last 168 hours  Recent Results (from the past 240 hour(s))  CULTURE, BLOOD  (ROUTINE X 2)     Status: None  Collection Time    08/27/13 11:10 AM      Result Value Ref Range Status   Specimen Description BLOOD RIGHT ARM   Final   Special Requests BOTTLES DRAWN AEROBIC AND ANAEROBIC   Final   Culture  Setup Time     Final   Value: 08/27/2013 14:17     Performed at [facility]   Culture     Final   Value: NO GROWTH 5 DAYS     Performed at [facility]   Report Status 09/02/2013 FINAL   Final  URINE CULTURE     Status: None   Collection Time    08/27/13 11:30 AM      Result Value Ref Range Status   Specimen Description URINE, CATHETERIZED   Final   Special Requests NONE   Final   Culture  Setup Time     Final   Value: 08/27/2013 14:20     Performed at [facility] Count     Final   Value: 50,000 COLONIES/ML     Performed at [facility]   Culture     Final   Value: Multiple bacterial morphotypes present, none predominant. Suggest appropriate recollection if clinically indicated.     Performed at [facility]   Report Status 08/28/2013 FINAL   Final  CULTURE, BLOOD (ROUTINE X 2)     Status: None   Collection Time    08/27/13 11:38 AM      Result Value Ref Range Status   Specimen Description BLOOD LEFT FOREARM   Final   Special Requests BOTTLES DRAWN AEROBIC AND ANAEROBIC   Final   Culture  Setup Time     Final   Value: 08/27/2013 14:17     Performed at [facility]   Culture     Final   Value: NO GROWTH 5 DAYS     Performed at [facility]   Report Status 09/02/2013 FINAL   Final  MRSA PCR SCREENING     Status: None   Collection Time    08/27/13  2:50 PM      Result Value Ref Range Status   MRSA by PCR NEGATIVE  NEGATIVE Final   Comment:            The GeneXpert MRSA Assay (FDA     approved for NASAL specimens     only), is one component of a     comprehensive MRSA colonization     surveillance program. It is not     intended to diagnose MRSA     infection nor to guide  or     monitor treatment for     MRSA infections.  CLOSTRIDIUM DIFFICILE BY PCR     Status: Abnormal   Collection Time    08/27/13  8:27 PM      Result Value Ref Range Status   C difficile by pcr POSITIVE (*) NEGATIVE Final   Comment: CRITICAL RESULT CALLED TO, READ BACK BY AND VERIFIED WITH:     Aris Georgia RN 10:40 08/28/13 ([email or web address])     Performed at [facility]     Studies: No results found.  Scheduled Meds: . antiseptic oral rinse  15 mL Mouth Rinse BID  . heparin  5,000 Units Subcutaneous 3 times per day  . levothyroxine  75 mcg Oral QAC breakfast  . metronidazole  500 mg Intravenous Q8H  . tiotropium  18 mcg  Inhalation QHS  . vancomycin  125 mg Oral BID   Followed by  . [START ON 09/12/2013] vancomycin  125 mg Oral Daily   Followed by  . [START ON 09/19/2013] vancomycin  125 mg Oral QODAY   Followed by  . [START ON 09/27/2013] vancomycin  125 mg Oral Q3 days  . vancomycin (VANCOCIN) rectal ENEMA  500 mg Rectal Q6H   Continuous Infusions:    Principal Problem:   SIRS (systemic inflammatory response syndrome) Active Problems:   Arthritis   Hypothyroidism   Tachycardia   Weakness   Hypovolemic shock   Enteritis due to Clostridium difficile    Time spent: 35 minutes    Rishon Thilges  [facility] Pager [phone] If 7PM-7AM, please contact night-coverage at [email or web address], password [email or web address] 09/04/2013, 3:40 PM  LOS: 8 days

## 2013-09-04 NOTE — Progress Notes (Signed)
PT Cancellation Note  Patient Details Name: Amanda SailorsRebekah P Armstrong MRN: [id number] DOB: [birth year]   Cancelled Treatment:    Reason Eval/Treat Not Completed: Fatigue/lethargy limiting ability to participate "I hope you're not coming in here.  I can't do it.  I'm too weak."  Attempted to encourage more mobility to decrease fatigue and weakness however pt reports just getting "changed" and states she can't tolerate activity at this time.   Lamira Borin,KATHrine E 09/04/2013, 11:57 AM Zenovia JarredKati Lakiah Dhingra, PT, DPT 09/04/2013 Pager: [phone]

## 2013-09-05 LAB — BASIC METABOLIC PANEL
BUN: 4 mg/dL — ABNORMAL LOW (ref 6–23)
CO2: 23 meq/L (ref 19–32)
CREATININE: 0.61 mg/dL (ref 0.50–1.10)
Calcium: 8.5 mg/dL (ref 8.4–10.5)
Chloride: 103 mEq/L (ref 96–112)
GFR calc Af Amer: >90 mL/min (ref >90)
GFR calc non Af Amer: >90 mL/min (ref >90)
Glucose, Bld: 96 mg/dL (ref 70–99)
Potassium: 3.5 mEq/L — ABNORMAL LOW (ref 3.7–5.3)
Sodium: 139 mEq/L (ref 137–147)

## 2013-09-05 LAB — MAGNESIUM: Magnesium: 2.2 mg/dL (ref 1.5–2.5)

## 2013-09-05 MED ORDER — POTASSIUM CHLORIDE CRYS ER 20 MEQ PO TBCR
40.0000 meq | EXTENDED_RELEASE_TABLET | Freq: Three times a day (TID) | ORAL | Status: AC
  Administered 2013-09-05 (×3): 40 meq via ORAL
  Filled 2013-09-05 (×3): qty 2

## 2013-09-05 NOTE — Progress Notes (Signed)
CSW continuing to follow.  Per MD, pt not yet medically stable for discharge.  CSW [facility] message with [facility] and sent updated clinicals via [facility].  CSW to continue to follow to assist with pt discharge to [facility] when pt medically ready for discharge [facility].  Loletta SpecterSuzanna Savas Elvin, MSW, LCSW Clinical Social Work [phone]

## 2013-09-05 NOTE — Progress Notes (Signed)
[facility] PROGRESS NOTE  TATA TIMMINS ZOX:[id number] DOB: Aug 01, 1945 DOA: 08/27/2013 PCP: [name], MD  Interim Summary Patient is a pleasant 68 year old female with a past medical history of C. difficile colitis diagnosed on recent hospitalization on 08/03/2013, who was admitted to the medicine service on 08/27/2013. She presented with complaints of generalized weakness , fatigue, having a fall at home. She is found to be hypotensive, tachycardic, profoundly dehydrated. she was admitted to the ICU and started on IV pressors. she was also started on broad-spectrum IV antibiotic therapy with vancomycin and Zosyn as well as IV Flagyl given history of C. difficile colitis. She was administered IV fluids and by the following day her Neo-Synephrine was weaned off. Given persistent watery diarrhea a C. difficile was suspected as vancomycin and Zosyn were discontinued on 08/28/13. C. difficile came back positive as she was treated with oral vancomycin given at this likely represented a recurrence. given clinical improvement she was transferred out of the intensive care unit to MedSurg floor on 08/29/2013. Initially it seemed that her diarrhea has markedly improved however on the morning of 08/31/2013 she reported having multiple liquid consistency bowel movements the night prior.  Initially I had increased her vancomycin dose to 250 mg 4 times daily however given the persistence of diarrhea I discussed case with Dr. Ninetta Lights of infectious disease on 09/02/2012. He recommending adding IV Flagyl and vancomycin enema 4 times daily. This was started on the evening of 09/02/2013. This morning she complains of ongoing liquid consistency diarrhea and generalized weakness. She has been accepted to skilled nursing facility once medically stable.                                                                                                                                                                                                                                                              Assessment/Plan:  1. Hypovolemic shock, requiring IV pressors initially, now resolved as IV pressors were weaned off on 08/28/13. 2. Sepsis, present on admission, evidenced by hypotension that required pressor support, respiratory rate of 27, HR of 121. Source of infection likely to be from C diff colitis.    3. C. difficile colitis. She has a history of Cdiff colitis diagnosed Patient presenting with multiple episodes of diarrhea, stool for  Cdiff was positive. IV Zosyn and vancomycin were stopped on 08/28/13, as Oral Vanc was started on 08/28/13. Patient having multiple liquid consistency bowel movements, although white count remains within normal limits, and she is hemodynamixcally stable. Given the lack of significant improvement,  I spoke with Dr Ninetta Lights of infectious disease who recommended adding IV Flagyl and Vancomycin enema.  Continue supportive care 4. Wheezing. Improved.  5. Hypokalemia. Will be repleted.  6. Hypothyroidism. TSH within normal limits at 0.696. Continue thyroid replacement therapy. 7. Deconditioning. Patient deconditioned, continues to have generalized weakness, physical therapy consulted. May benefit from SNF.   8. DVT prophylaxis. Heparin subcutaneous   Code Status: Full code Disposition Plan: Plan to discharge to skilled nursing facility when medically stable.   Consultants:  Pulmonary critical care medicine   Antibiotics:  Vancomycin (discontinued on 08/28/2013)  Zosyn (discontinued on 08/28/2013)  Oral Vancomycin (started on 08/28/13)  Vancomycin enema 4 times daily (started on 09/02/2013)  Flagyl 500 mg IV Q8 hours (started on 09/02/2013)  HPI/Subjective: abd cramps improved. No nausea or vomiting.   Objective: Filed Vitals:   09/05/13 1445  BP: 138/66  Pulse: 77  Temp: 98.5 F (36.9 C)  Resp: 18    Intake/Output Summary (Last 24 hours) at 09/05/13  1535 Last data filed at 09/04/13 1610  Gross per 24 hour  Intake    240 ml  Output      0 ml  Net    240 ml   Filed Weights   08/27/13 1434 08/27/13 1439 08/29/13 1223  Weight: 69.8 kg (153 lb 14.1 oz) 69.8 kg (153 lb 14.1 oz) 74.889 kg (165 lb 1.6 oz)    Exam:   General:  Patient is in no acute distress awake alert, states feeling weak  Cardiovascular: Regular rate rhythm normal S1-S2  Respiratory: Normal respiratory effort, lungs are clear to auscultation bilaterally  Abdomen: Patient having a generalized abdominal pain to palpation, positive bowel, no peritoneal signs noted  Musculoskeletal: Continues to have some pain to bilateral legs, trace edema to lower extremities.   Data Reviewed: Basic Metabolic Panel:  Recent Labs Lab 08/31/13 0442 09/01/13 0407 09/02/13 0349 09/03/13 0330 09/05/13 0335  NA 142 140 141 137 139  K 3.8 3.4* 3.5* 3.2* 3.5*  CL 109 105 104 102 103  CO2 22 24 25 26 23   GLUCOSE 87 79 96 94 96  BUN 5* 6 3* 3* 4*  CREATININE 0.60 0.62 0.64 0.61 0.61  CALCIUM 8.6 8.6 8.7 7.9* 8.5  MG  --   --   --   --  2.2   Liver Function Tests: No results found for this basename: AST, ALT, ALKPHOS, BILITOT, PROT, ALBUMIN,  in the last 168 hours No results found for this basename: LIPASE, AMYLASE,  in the last 168 hours No results found for this basename: AMMONIA,  in the last 168 hours CBC:  Recent Labs Lab 08/30/13 0500 08/31/13 0442 09/01/13 0407 09/02/13 0349 09/03/13 0330  WBC 7.4 7.7 7.2 7.2 7.3  HGB 10.4* 11.1* 10.4* 10.9* 10.9*  HCT 32.4* 34.0* 32.1* 32.6* 32.7*  MCV 88.8 87.9 87.9 86.5 87.0  PLT 213 249 223 267 276   Cardiac Enzymes: No results found for this basename: CKTOTAL, CKMB, CKMBINDEX, TROPONINI,  in the last 168 hours BNP (last 3 results)  Recent Labs  05/28/13 0545 08/27/13 1110 08/31/13 1347  PROBNP 1179.0* 208.9* 874.7*   CBG: No results found for this basename: GLUCAP,  in the last 168 hours  Recent Results  (from the past 240 hour(s))  CULTURE, BLOOD (ROUTINE X 2)     Status: None   Collection Time    08/27/13 11:10 AM      Result Value Ref Range Status   Specimen Description BLOOD RIGHT ARM   Final   Special Requests BOTTLES DRAWN AEROBIC AND ANAEROBIC   Final   Culture  Setup Time     Final   Value: 08/27/2013 14:17     Performed at [facility]   Culture     Final   Value: NO GROWTH 5 DAYS     Performed at [facility]   Report Status 09/02/2013 FINAL   Final  URINE CULTURE     Status: None   Collection Time    08/27/13 11:30 AM      Result Value Ref Range Status   Specimen Description URINE, CATHETERIZED   Final   Special Requests NONE   Final   Culture  Setup Time     Final   Value: 08/27/2013 14:20     Performed at [facility] Count     Final   Value: 50,000 COLONIES/ML     Performed at [facility]   Culture     Final   Value: Multiple bacterial morphotypes present, none predominant. Suggest appropriate recollection if clinically indicated.     Performed at [facility]   Report Status 08/28/2013 FINAL   Final  CULTURE, BLOOD (ROUTINE X 2)     Status: None   Collection Time    08/27/13 11:38 AM      Result Value Ref Range Status   Specimen Description BLOOD LEFT FOREARM   Final   Special Requests BOTTLES DRAWN AEROBIC AND ANAEROBIC   Final   Culture  Setup Time     Final   Value: 08/27/2013 14:17     Performed at [facility]   Culture     Final   Value: NO GROWTH 5 DAYS     Performed at [facility]   Report Status 09/02/2013 FINAL   Final  MRSA PCR SCREENING     Status: None   Collection Time    08/27/13  2:50 PM      Result Value Ref Range Status   MRSA by PCR NEGATIVE  NEGATIVE Final   Comment:            The GeneXpert MRSA Assay (FDA     approved for NASAL specimens     only), is one component of a     comprehensive MRSA colonization     surveillance program. It is not     intended  to diagnose MRSA     infection nor to guide or     monitor treatment for     MRSA infections.  CLOSTRIDIUM DIFFICILE BY PCR     Status: Abnormal   Collection Time    08/27/13  8:27 PM      Result Value Ref Range Status   C difficile by pcr POSITIVE (*) NEGATIVE Final   Comment: CRITICAL RESULT CALLED TO, READ BACK BY AND VERIFIED WITH:     Aris Georgia RN 10:40 08/28/13 ([email or web address])     Performed at [facility]     Studies: No results found.  Scheduled Meds: . antiseptic oral rinse  15 mL Mouth Rinse BID  . heparin  5,000 Units Subcutaneous 3 times per day  .  levothyroxine  75 mcg Oral QAC breakfast  . metronidazole  500 mg Intravenous Q8H  . potassium chloride  40 mEq Oral TID  . tiotropium  18 mcg Inhalation QHS  . vancomycin  125 mg Oral BID   Followed by  . [START ON 09/12/2013] vancomycin  125 mg Oral Daily   Followed by  . [START ON 09/19/2013] vancomycin  125 mg Oral QODAY   Followed by  . [START ON 09/27/2013] vancomycin  125 mg Oral Q3 days  . vancomycin (VANCOCIN) rectal ENEMA  500 mg Rectal Q6H   Continuous Infusions:    Principal Problem:   SIRS (systemic inflammatory response syndrome) Active Problems:   Arthritis   Hypothyroidism   Tachycardia   Weakness   Hypovolemic shock   Enteritis due to Clostridium difficile    Time spent: 35 minutes    Ary Lavine  [facility] Pager [phone] If 7PM-7AM, please contact night-coverage at [email or web address], password [email or web address] 09/05/2013, 3:35 PM  LOS: 9 days

## 2013-09-06 LAB — BASIC METABOLIC PANEL
BUN: 4 mg/dL — ABNORMAL LOW (ref 6–23)
CHLORIDE: 102 meq/L (ref 96–112)
CO2: 22 meq/L (ref 19–32)
CREATININE: 0.63 mg/dL (ref 0.50–1.10)
Calcium: 8.7 mg/dL (ref 8.4–10.5)
GFR calc Af Amer: >90 mL/min (ref >90)
GFR calc non Af Amer: 89 mL/min — ABNORMAL LOW (ref >90)
Glucose, Bld: 100 mg/dL — ABNORMAL HIGH (ref 70–99)
Potassium: 4.4 mEq/L (ref 3.7–5.3)
Sodium: 136 mEq/L — ABNORMAL LOW (ref 137–147)

## 2013-09-06 NOTE — Progress Notes (Signed)
CSW continuing to follow for disposition planning.  CSW spoke with MD who stated that pt not yet medically ready for discharge. Per MD, anticipate potential discharge on Monday.  [facility] spoke with [facility] who confirmed that pt would be able to admit to facility on Monday and facility received [employer] authorization today, but will remain valid for hopeful admission to SNF on Monday. Per [facility], facility can manage flexi seal if pt requires flexi seal at discharge.  CSW to continue to follow and facilitate pt discharge needs [facility] when pt medically ready for discharge.  Amanda SpecterSuzanna Enna Armstrong, MSW, LCSW Clinical Social Work [phone]

## 2013-09-06 NOTE — Progress Notes (Signed)
[facility] PROGRESS NOTE  Amanda SailorsRebekah P Armstrong WUJ:[id number] DOB: 1946/06/17 DOA: 08/27/2013 PCP: Kirk RuthsMCGOUGH,WILLIAM M, MD  Interim Summary Patient is a pleasant 68 year old female with a past medical history of C. difficile colitis diagnosed on recent hospitalization on 08/03/2013, who was admitted to the medicine service on 08/27/2013. She presented with complaints of generalized weakness , fatigue, having a fall at home. She is found to be hypotensive, tachycardic, profoundly dehydrated. she was admitted to the ICU and started on IV pressors. she was also started on broad-spectrum IV antibiotic therapy with vancomycin and Zosyn as well as IV Flagyl given history of C. difficile colitis. She was administered IV fluids and by the following day her Neo-Synephrine was weaned off. Given persistent watery diarrhea a C. difficile was suspected as vancomycin and Zosyn were discontinued on 08/28/13. C. difficile came back positive as she was treated with oral vancomycin given at this likely represented a recurrence. given clinical improvement she was transferred out of the intensive care unit to MedSurg floor on 08/29/2013. Initially it seemed that her diarrhea has markedly improved however on the morning of 08/31/2013 she reported having multiple liquid consistency bowel movements the night prior.  Initially I had increased her vancomycin dose to 250 mg 4 times daily however given the persistence of diarrhea I discussed case with Dr. Ninetta LightsHatcher of infectious disease on 09/02/2012. He recommending adding IV Flagyl and vancomycin enema 4 times daily. This was started on the evening of 09/02/2013. This morning she complains of ongoing liquid consistency diarrhea and generalized weakness. She has been accepted to skilled nursing facility once medically stable.                                                                                                                                                                                                                                                              Assessment/Plan:  1. Hypovolemic shock, requiring IV pressors initially, now resolved as IV pressors were weaned off on 08/28/13. 2. Sepsis, present on admission, evidenced by hypotension that required pressor support,  Source of infection likely to be from C diff colitis.    It has since resolved.  3. C. difficile colitis. She has a history of Cdiff colitis diagnosed Patient presenting with multiple episodes of diarrhea, stool for Cdiff  was positive. IV Zosyn and vancomycin were stopped on 08/28/13, as Oral Vanc was started on 08/28/13. Patient having multiple liquid consistency bowel movements, although white count remains within normal limits, and she is hemodynamixcally stable. Given the lack of significant improvement,  I spoke with Dr Ninetta Lights of infectious disease who recommended adding IV Flagyl and Vancomycin enema.  Continue supportive care. Her stool consistency has improved. She denies any abdominal pain or nausea or vomiting.  4. Wheezing:. Resolved.  5. Hypokalemia. Will be repleted.  6. Hypothyroidism. TSH within normal limits at 0.696. Continue thyroid replacement therapy. 7. Deconditioning. Patient deconditioned, continues to have generalized weakness, physical therapy consulted. May benefit from SNF.   8. DVT prophylaxis. Heparin subcutaneous   Code Status: Full code Disposition Plan: Plan to discharge to skilled nursing facility when medically stable.   Consultants:  Pulmonary critical care medicine   Antibiotics:  Vancomycin (discontinued on 08/28/2013)  Zosyn (discontinued on 08/28/2013)  Oral Vancomycin (started on 08/28/13)  Vancomycin enema 4 times daily (started on 09/02/2013)  Flagyl 500 mg IV Q8 hours (started on 09/02/2013)  HPI/Subjective:  no abd cramps. No nausea or vomiting.  Wanted to go home.  Objective: Filed Vitals:   09/06/13 1514  BP: 131/58  Pulse: 63   Temp: 98.1 F (36.7 C)  Resp: 18    Intake/Output Summary (Last 24 hours) at 09/06/13 1828 Last data filed at 09/06/13 0602  Gross per 24 hour  Intake   1200 ml  Output    800 ml  Net    400 ml   Filed Weights   08/27/13 1434 08/27/13 1439 08/29/13 1223  Weight: 69.8 kg (153 lb 14.1 oz) 69.8 kg (153 lb 14.1 oz) 74.889 kg (165 lb 1.6 oz)    Exam:   General:  Patient is in no acute distress awake alert, states feeling weak  Cardiovascular: Regular rate rhythm normal S1-S2  Respiratory: Normal respiratory effort, lungs are clear to auscultation bilaterally  Abdomen: Patient having a generalized abdominal pain to palpation, positive bowel, no peritoneal signs noted  Musculoskeletal: Continues to have some pain to bilateral legs, trace edema to lower extremities.   Data Reviewed: Basic Metabolic Panel:  Recent Labs Lab 09/01/13 0407 09/02/13 0349 09/03/13 0330 09/05/13 0335 09/06/13 0348  NA 140 141 137 139 136*  K 3.4* 3.5* 3.2* 3.5* 4.4  CL 105 104 102 103 102  CO2 24 25 26 23 22   GLUCOSE 79 96 94 96 100*  BUN 6 3* 3* 4* 4*  CREATININE 0.62 0.64 0.61 0.61 0.63  CALCIUM 8.6 8.7 7.9* 8.5 8.7  MG  --   --   --  2.2  --    Liver Function Tests: No results found for this basename: AST, ALT, ALKPHOS, BILITOT, PROT, ALBUMIN,  in the last 168 hours No results found for this basename: LIPASE, AMYLASE,  in the last 168 hours No results found for this basename: AMMONIA,  in the last 168 hours CBC:  Recent Labs Lab 08/31/13 0442 09/01/13 0407 09/02/13 0349 09/03/13 0330  WBC 7.7 7.2 7.2 7.3  HGB 11.1* 10.4* 10.9* 10.9*  HCT 34.0* 32.1* 32.6* 32.7*  MCV 87.9 87.9 86.5 87.0  PLT 249 223 267 276   Cardiac Enzymes: No results found for this basename: CKTOTAL, CKMB, CKMBINDEX, TROPONINI,  in the last 168 hours BNP (last 3 results)  Recent Labs  05/28/13 0545 08/27/13 1110 08/31/13 1347  PROBNP 1179.0* 208.9* 874.7*   CBG: No results found for this  basename: GLUCAP,  in the last 168 hours  Recent Results (from the past 240 hour(s))  CLOSTRIDIUM DIFFICILE BY PCR     Status: Abnormal   Collection Time    08/27/13  8:27 PM      Result Value Ref Range Status   C difficile by pcr POSITIVE (*) NEGATIVE Final   Comment: CRITICAL RESULT CALLED TO, READ BACK BY AND VERIFIED WITH:     Aris Georgia RN 10:40 08/28/13 ([email or web address])     Performed at [facility]     Studies: No results found.  Scheduled Meds: . antiseptic oral rinse  15 mL Mouth Rinse BID  . heparin  5,000 Units Subcutaneous 3 times per day  . levothyroxine  75 mcg Oral QAC breakfast  . metronidazole  500 mg Intravenous Q8H  . tiotropium  18 mcg Inhalation QHS  . vancomycin  125 mg Oral BID   Followed by  . [START ON 09/12/2013] vancomycin  125 mg Oral Daily   Followed by  . [START ON 09/19/2013] vancomycin  125 mg Oral QODAY   Followed by  . [START ON 09/27/2013] vancomycin  125 mg Oral Q3 days  . vancomycin (VANCOCIN) rectal ENEMA  500 mg Rectal Q6H   Continuous Infusions:    Principal Problem:   SIRS (systemic inflammatory response syndrome) Active Problems:   Arthritis   Hypothyroidism   Tachycardia   Weakness   Hypovolemic shock   Enteritis due to Clostridium difficile    Time spent: 25 minutes    Amanuel Sinkfield  [facility] Pager [phone] If 7PM-7AM, please contact night-coverage at [email or web address], password [facility] 09/06/2013, 6:28 PM  LOS: 10 days

## 2013-09-06 NOTE — Progress Notes (Signed)
PT Cancellation Note  Patient Details Name: Amanda SailorsRebekah P Armstrong MRN: [id number] DOB: 1945/11/10   Cancelled Treatment:    Reason Eval/Treat Not Completed: Other (comment) (Pt stated she didn't want to "take any chances with [facility]" [facility] )  Explained to pt that flexiseal did not prohibit mobility, however she still refused PT. Will follow.    Ralene BatheUhlenberg, Dylyn [facility] 09/06/2013, 11:35 AM [phone]

## 2013-09-07 NOTE — Progress Notes (Signed)
[facility] PROGRESS NOTE  Trey SailorsRebekah P Dehaas WUJ:[id number] DOB: June 04, 1946 DOA: 08/27/2013 PCP: Kirk RuthsMCGOUGH,WILLIAM M, MD  Interim Summary Patient is a pleasant 68 year old female with a past medical history of C. difficile colitis diagnosed on recent hospitalization on 08/03/2013, who was admitted to the medicine service on 08/27/2013. She presented with complaints of generalized weakness , fatigue, having a fall at home. She is found to be hypotensive, tachycardic, profoundly dehydrated. she was admitted to the ICU and started on IV pressors. she was also started on broad-spectrum IV antibiotic therapy with vancomycin and Zosyn as well as IV Flagyl given history of C. difficile colitis. She was administered IV fluids and by the following day her Neo-Synephrine was weaned off. Given persistent watery diarrhea a C. difficile was suspected as vancomycin and Zosyn were discontinued on 08/28/13. C. difficile came back positive as she was treated with oral vancomycin given at this likely represented a recurrence. given clinical improvement she was transferred out of the intensive care unit to MedSurg floor on 08/29/2013. Initially it seemed that her diarrhea has markedly improved however on the morning of 08/31/2013 she reported having multiple liquid consistency bowel movements the night prior.  Initially I had increased her vancomycin dose to 250 mg 4 times daily however given the persistence of diarrhea I discussed case with Dr. Ninetta LightsHatcher of infectious disease on 09/02/2012. He recommending adding IV Flagyl and vancomycin enema 4 times daily. This was started on the evening of 09/02/2013. This morning she complains of ongoing liquid consistency diarrhea and generalized weakness. She has been accepted to skilled nursing facility once medically stable.                                                                                                                                                                                                                                                              Assessment/Plan:  1. Hypovolemic shock, requiring IV pressors initially, now resolved as IV pressors were weaned off on 08/28/13. 2. Sepsis, present on admission, evidenced by hypotension that required pressor support,  Source of infection likely to be from C diff colitis.    It has since resolved.  3. C. difficile colitis. She has a history of Cdiff colitis diagnosed Patient presenting with multiple episodes of diarrhea, stool for Cdiff  was positive. IV Zosyn and vancomycin were stopped on 08/28/13, as Oral Vanc was started on 08/28/13. Patient having multiple liquid consistency bowel movements, although white count remains within normal limits, and she is hemodynamixcally stable. Given the lack of significant improvement,  I spoke with Dr Ninetta Lights of infectious disease who recommended adding IV Flagyl and Vancomycin enema.  Continue supportive care. Her stool consistency has improved. She denies any abdominal pain or nausea or vomiting.  4. Wheezing:. Resolved.  5. Hypokalemia. Will be repleted.  6. Hypothyroidism. TSH within normal limits at 0.696. Continue thyroid replacement therapy. 7. Deconditioning. Patient deconditioned, continues to have generalized weakness, physical therapy consulted. May benefit from SNF.   8. DVT prophylaxis. Heparin subcutaneous   Code Status: Full code Disposition Plan: Plan to discharge to skilled nursing facility when medically stable.   Consultants:  Pulmonary critical care medicine   Antibiotics:  Vancomycin (discontinued on 08/28/2013)  Zosyn (discontinued on 08/28/2013)  Oral Vancomycin (started on 08/28/13)  Vancomycin enema 4 times daily (started on 09/02/2013)  Flagyl 500 mg IV Q8 hours (started on 09/02/2013)  HPI/Subjective:  no abd cramps. No nausea or vomiting.  Wanted to go home.  Objective: Filed Vitals:   09/07/13 0628  BP: 135/59  Pulse: 73   Temp: 98.1 F (36.7 C)  Resp: 18    Intake/Output Summary (Last 24 hours) at 09/07/13 1500 Last data filed at 09/07/13 0900  Gross per 24 hour  Intake    360 ml  Output      3 ml  Net    357 ml   Filed Weights   08/27/13 1434 08/27/13 1439 08/29/13 1223  Weight: 69.8 kg (153 lb 14.1 oz) 69.8 kg (153 lb 14.1 oz) 74.889 kg (165 lb 1.6 oz)    Exam:   General:  Patient is in no acute distress awake alert, states feeling weak  Cardiovascular: Regular rate rhythm normal S1-S2  Respiratory: Normal respiratory effort, lungs are clear to auscultation bilaterally  Abdomen: Patient having a generalized abdominal pain to palpation, positive bowel, no peritoneal signs noted  Musculoskeletal: Continues to have some pain to bilateral legs, trace edema to lower extremities.   Data Reviewed: Basic Metabolic Panel:  Recent Labs Lab 09/01/13 0407 09/02/13 0349 09/03/13 0330 09/05/13 0335 09/06/13 0348  NA 140 141 137 139 136*  K 3.4* 3.5* 3.2* 3.5* 4.4  CL 105 104 102 103 102  CO2 24 25 26 23 22   GLUCOSE 79 96 94 96 100*  BUN 6 3* 3* 4* 4*  CREATININE 0.62 0.64 0.61 0.61 0.63  CALCIUM 8.6 8.7 7.9* 8.5 8.7  MG  --   --   --  2.2  --    Liver Function Tests: No results found for this basename: AST, ALT, ALKPHOS, BILITOT, PROT, ALBUMIN,  in the last 168 hours No results found for this basename: LIPASE, AMYLASE,  in the last 168 hours No results found for this basename: AMMONIA,  in the last 168 hours CBC:  Recent Labs Lab 09/01/13 0407 09/02/13 0349 09/03/13 0330  WBC 7.2 7.2 7.3  HGB 10.4* 10.9* 10.9*  HCT 32.1* 32.6* 32.7*  MCV 87.9 86.5 87.0  PLT 223 267 276   Cardiac Enzymes: No results found for this basename: CKTOTAL, CKMB, CKMBINDEX, TROPONINI,  in the last 168 hours BNP (last 3 results)  Recent Labs  05/28/13 0545 08/27/13 1110 08/31/13 1347  PROBNP 1179.0* 208.9* 874.7*   CBG: No results found for this basename: GLUCAP,  in  the last 168 hours  No  results found for this or any previous visit (from the past 240 hour(s)).   Studies: No results found.  Scheduled Meds: . antiseptic oral rinse  15 mL Mouth Rinse BID  . heparin  5,000 Units Subcutaneous 3 times per day  . levothyroxine  75 mcg Oral QAC breakfast  . metronidazole  500 mg Intravenous Q8H  . tiotropium  18 mcg Inhalation QHS  . vancomycin  125 mg Oral BID   Followed by  . [START ON 09/12/2013] vancomycin  125 mg Oral Daily   Followed by  . [START ON 09/19/2013] vancomycin  125 mg Oral QODAY   Followed by  . [START ON 09/27/2013] vancomycin  125 mg Oral Q3 days  . vancomycin (VANCOCIN) rectal ENEMA  500 mg Rectal Q6H   Continuous Infusions:    Principal Problem:   SIRS (systemic inflammatory response syndrome) Active Problems:   Arthritis   Hypothyroidism   Tachycardia   Weakness   Hypovolemic shock   Enteritis due to Clostridium difficile    Time spent: 25 minutes    Kolsen Choe  [facility] Pager [phone] If 7PM-7AM, please contact night-coverage at [email or web address], password [facility] 09/07/2013, 3:00 PM  LOS: 11 days

## 2013-09-08 NOTE — Progress Notes (Signed)
[facility] PROGRESS NOTE  Trey SailorsRebekah P Floresca ZOX:[id number] DOB: 13-May-1946 DOA: 08/27/2013 PCP: Kirk RuthsMCGOUGH,WILLIAM M, MD  Interim Summary Patient is a pleasant 68 year old female with a past medical history of C. difficile colitis diagnosed on recent hospitalization on 08/03/2013, who was admitted to the medicine service on 08/27/2013. She presented with complaints of generalized weakness , fatigue, having a fall at home. She is found to be hypotensive, tachycardic, profoundly dehydrated. she was admitted to the ICU and started on IV pressors. she was also started on broad-spectrum IV antibiotic therapy with vancomycin and Zosyn as well as IV Flagyl given history of C. difficile colitis. She was administered IV fluids and by the following day her Neo-Synephrine was weaned off. Given persistent watery diarrhea a C. difficile was suspected as vancomycin and Zosyn were discontinued on 08/28/13. C. difficile came back positive as she was treated with oral vancomycin given at this likely represented a recurrence. given clinical improvement she was transferred out of the intensive care unit to MedSurg floor on 08/29/2013. Initially it seemed that her diarrhea has markedly improved however on the morning of 08/31/2013 she reported having multiple liquid consistency bowel movements the night prior.  Initially I had increased her vancomycin dose to 250 mg 4 times daily however given the persistence of diarrhea  discussed case with Dr. Ninetta LightsHatcher of infectious disease on 09/02/2012. He recommending adding IV Flagyl and vancomycin enema 4 times daily. This was started on the evening of 09/02/2013. This morning she complains of ongoing liquid consistency diarrhea and generalized weakness. She has been accepted to skilled nursing facility once medically stable.                                                                                                                                                                                                                                                              Assessment/Plan:  1. Hypovolemic shock, requiring IV pressors initially, now resolved as IV pressors were weaned off on 08/28/13. 2. Sepsis, present on admission, evidenced by hypotension that required pressor support,  Source of infection likely to be from C diff colitis.    It has since resolved.  3. C. difficile colitis. She has a history of Cdiff colitis diagnosed Patient presenting with multiple episodes of diarrhea, stool for Cdiff  was positive. IV Zosyn and vancomycin were stopped on 08/28/13, as Oral Vanc was started on 08/28/13. Patient having multiple liquid consistency bowel movements, although white count remains within normal limits, and she is hemodynamixcally stable. Given the lack of significant improvement,  I spoke with Dr Ninetta Lights of infectious disease who recommended adding IV Flagyl and Vancomycin enema.  Continue supportive care. Her stool consistency has improved. She denies any abdominal pain or nausea or vomiting.  4. Wheezing:. Resolved.  5. Hypokalemia. Will be repleted.  6. Hypothyroidism. TSH within normal limits at 0.696. Continue thyroid replacement therapy. 7. Deconditioning. Patient deconditioned, continues to have generalized weakness, physical therapy consulted. May benefit from SNF.   8. DVT prophylaxis. Heparin subcutaneous   Code Status: Full code Disposition Plan: Plan to discharge to skilled nursing facility when medically stable.   Consultants:  Pulmonary critical care medicine   Antibiotics:  Vancomycin (discontinued on 08/28/2013)  Zosyn (discontinued on 08/28/2013)  Oral Vancomycin (started on 08/28/13)  Vancomycin enema 4 times daily (started on 09/02/2013)  Flagyl 500 mg IV Q8 hours (started on 09/02/2013)  HPI/Subjective:  no abd cramps. No nausea or vomiting.  Wanted to go home.  Objective: Filed Vitals:   09/08/13 0430  BP: 118/64  Pulse: 63  Temp:  98.2 F (36.8 C)  Resp: 16    Intake/Output Summary (Last 24 hours) at 09/08/13 1307 Last data filed at 09/08/13 0900  Gross per 24 hour  Intake    360 ml  Output    200 ml  Net    160 ml   Filed Weights   08/27/13 1434 08/27/13 1439 08/29/13 1223  Weight: 69.8 kg (153 lb 14.1 oz) 69.8 kg (153 lb 14.1 oz) 74.889 kg (165 lb 1.6 oz)    Exam:   General:  Patient is in no acute distress awake alert, states feeling weak  Cardiovascular: Regular rate rhythm normal S1-S2  Respiratory: Normal respiratory effort, lungs are clear to auscultation bilaterally  Abdomen: Patient having a generalized abdominal pain to palpation, positive bowel, no peritoneal signs noted  Musculoskeletal: Continues to have some pain to bilateral legs, trace edema to lower extremities.   Data Reviewed: Basic Metabolic Panel:  Recent Labs Lab 09/02/13 0349 09/03/13 0330 09/05/13 0335 09/06/13 0348  NA 141 137 139 136*  K 3.5* 3.2* 3.5* 4.4  CL 104 102 103 102  CO2 25 26 23 22   GLUCOSE 96 94 96 100*  BUN 3* 3* 4* 4*  CREATININE 0.64 0.61 0.61 0.63  CALCIUM 8.7 7.9* 8.5 8.7  MG  --   --  2.2  --    Liver Function Tests: No results found for this basename: AST, ALT, ALKPHOS, BILITOT, PROT, ALBUMIN,  in the last 168 hours No results found for this basename: LIPASE, AMYLASE,  in the last 168 hours No results found for this basename: AMMONIA,  in the last 168 hours CBC:  Recent Labs Lab 09/02/13 0349 09/03/13 0330  WBC 7.2 7.3  HGB 10.9* 10.9*  HCT 32.6* 32.7*  MCV 86.5 87.0  PLT 267 276   Cardiac Enzymes: No results found for this basename: CKTOTAL, CKMB, CKMBINDEX, TROPONINI,  in the last 168 hours BNP (last 3 results)  Recent Labs  05/28/13 0545 08/27/13 1110 08/31/13 1347  PROBNP 1179.0* 208.9* 874.7*   CBG: No results found for this basename: GLUCAP,  in the last 168 hours  No results found for this or any previous visit (from the past 240 hour(s)).   Studies:  No results  found.  Scheduled Meds: . antiseptic oral rinse  15 mL Mouth Rinse BID  . heparin  5,000 Units Subcutaneous 3 times per day  . levothyroxine  75 mcg Oral QAC breakfast  . metronidazole  500 mg Intravenous Q8H  . tiotropium  18 mcg Inhalation QHS  . vancomycin  125 mg Oral BID   Followed by  . [START ON 09/12/2013] vancomycin  125 mg Oral Daily   Followed by  . [START ON 09/19/2013] vancomycin  125 mg Oral QODAY   Followed by  . [START ON 09/27/2013] vancomycin  125 mg Oral Q3 days  . vancomycin (VANCOCIN) rectal ENEMA  500 mg Rectal Q6H   Continuous Infusions:    Principal Problem:   SIRS (systemic inflammatory response syndrome) Active Problems:   Arthritis   Hypothyroidism   Tachycardia   Weakness   Hypovolemic shock   Enteritis due to Clostridium difficile    Time spent: 25 minutes    Damaria Stofko  [facility] Pager [phone] If 7PM-7AM, please contact night-coverage at [email or web address], password [facility] 09/08/2013, 1:07 PM  LOS: 12 days

## 2013-09-09 DIAGNOSIS — R578 Other shock: Secondary | ICD-10-CM

## 2013-09-09 DIAGNOSIS — A419 Sepsis, unspecified organism: Secondary | ICD-10-CM

## 2013-09-09 DIAGNOSIS — R6510 Systemic inflammatory response syndrome (SIRS) of non-infectious origin without acute organ dysfunction: Secondary | ICD-10-CM

## 2013-09-09 LAB — CBC
HEMATOCRIT: 36.2 % (ref 36.0–46.0)
HEMOGLOBIN: 11.8 g/dL — AB (ref 12.0–15.0)
MCH: 28.6 pg (ref 26.0–34.0)
MCHC: 32.6 g/dL (ref 30.0–36.0)
MCV: 87.9 fL (ref 78.0–100.0)
Platelets: 420 10*3/uL — ABNORMAL HIGH (ref 150–400)
RBC: 4.12 MIL/uL (ref 3.87–5.11)
RDW: 16.2 % — ABNORMAL HIGH (ref 11.5–15.5)
WBC: 8.1 10*3/uL (ref 4.0–10.5)

## 2013-09-09 LAB — BASIC METABOLIC PANEL
BUN: 9 mg/dL (ref 6–23)
CHLORIDE: 103 meq/L (ref 96–112)
CO2: 22 meq/L (ref 19–32)
CREATININE: 0.68 mg/dL (ref 0.50–1.10)
Calcium: 8.7 mg/dL (ref 8.4–10.5)
GFR calc Af Amer: >90 mL/min (ref >90)
GFR calc non Af Amer: 87 mL/min — ABNORMAL LOW (ref >90)
GLUCOSE: 88 mg/dL (ref 70–99)
Potassium: 3.7 mEq/L (ref 3.7–5.3)
Sodium: 138 mEq/L (ref 137–147)

## 2013-09-09 MED ORDER — ALPRAZOLAM 0.5 MG PO TABS: 0.5000 mg | ORAL_TABLET | Freq: Two times a day (BID) | ORAL | Status: DC | PRN

## 2013-09-09 MED ORDER — VANCOMYCIN 50 MG/ML ORAL SOLUTION
125.0000 mg | ORAL | Status: AC
Start: 2013-09-27 — End: 2013-10-11

## 2013-09-09 MED ORDER — VANCOMYCIN 50 MG/ML ORAL SOLUTION: 125.0000 mg | ORAL | Status: AC

## 2013-09-09 MED ORDER — RIFAXIMIN 200 MG PO TABS: 200.0000 mg | ORAL_TABLET | Freq: Three times a day (TID) | ORAL | Status: DC

## 2013-09-09 MED ORDER — VANCOMYCIN HCL 500 MG IV SOLR: 500.0000 mg | Freq: Four times a day (QID) | Status: AC

## 2013-09-09 MED ORDER — VANCOMYCIN 50 MG/ML ORAL SOLUTION: 125.0000 mg | Freq: Every day | ORAL | Status: AC

## 2013-09-09 MED ORDER — RIFAXIMIN 200 MG PO TABS
200.0000 mg | ORAL_TABLET | Freq: Three times a day (TID) | ORAL | Status: DC
  Administered 2013-09-09: 200 mg via ORAL
  Filled 2013-09-09 (×3): qty 1

## 2013-09-09 MED ORDER — VANCOMYCIN 50 MG/ML ORAL SOLUTION: 125.0000 mg | Freq: Two times a day (BID) | ORAL | Status: AC

## 2013-09-09 NOTE — Discharge Summary (Signed)
Physician Discharge Summary  Amanda Armstrong RUE:[id number] DOB: [birth year] DOA: 08/27/2013  PCP: [name], MD  Admit date: 08/27/2013 Discharge date: 09/09/2013  Time spent: 35 minutes  Recommendations for Outpatient Follow-up:  1. Follow up with PCP in one week.   Discharge Diagnoses:  Principal Problem:   SIRS (systemic inflammatory response syndrome) Active Problems:   Arthritis   Hypothyroidism   Tachycardia   Weakness   Hypovolemic shock   Enteritis due to Clostridium difficile   Discharge Condition: improved  Diet recommendation: regular diet  Filed Weights   08/27/13 1434 08/27/13 1439 08/29/13 1223  Weight: 69.8 kg (153 lb 14.1 oz) 69.8 kg (153 lb 14.1 oz) 74.889 kg (165 lb 1.6 oz)    History of present illness:  Patient is a pleasant 68 year old female with a past medical history of C. difficile colitis diagnosed on recent hospitalization on 08/03/2013, who was admitted to the medicine service on 08/27/2013. She presented with complaints of generalized weakness , fatigue, having a fall at home. She is found to be hypotensive, tachycardic, profoundly dehydrated. she was admitted to the ICU and started on IV pressors. she was also started on broad-spectrum IV antibiotic therapy with vancomycin and Zosyn as well as IV Flagyl given history of C. difficile colitis. She was administered IV fluids and by the following day her Neo-Synephrine was weaned off. Given persistent watery diarrhea a C. difficile was suspected as vancomycin and Zosyn were discontinued on 08/28/13. C. difficile came back positive as she was treated with oral vancomycin given at this likely represented a recurrence. given clinical improvement she was transferred out of the intensive care unit to MedSurg floor on 08/29/2013. Initially it seemed that her diarrhea has markedly improved however on the morning of 08/31/2013 she reported having multiple liquid consistency bowel movements the night prior.  Initially I had increased her vancomycin dose to 250 mg 4 times daily however given the persistence of diarrhea discussed case with Dr. Ninetta Lights of infectious disease on 09/02/2012. He recommending adding IV Flagyl and vancomycin enema 4 times daily. This was started on the evening of 09/02/2013.  Her stool consistency has improved.  She has been accepted to skilled nursing facility once medically stable.    Hospital Course:   Hypovolemic shock, requiring IV pressors initially, now resolved as IV pressors were weaned off on 08/28/13.   Sepsis, present on admission, evidenced by hypotension that required pressor support, Source of infection likely to be from C diff colitis. It has since resolved.   C. difficile colitis.  Patient presenting with multiple episodes of diarrhea, stool for Cdiff was positive. IV Zosyn and vancomycin were stopped on 08/28/13, as Oral Vanc was started on 08/28/13. Patient having multiple liquid consistency bowel movements, although white count remains within normal limits, and she is hemodynamixcally stable. Given the lack of significant improvement,  spoke with Dr Ninetta Lights of infectious disease who recommended adding IV Flagyl and Vancomycin enema. Her stool consistency has improved. She denies any abdominal pain or nausea or vomiting. We will discharge her on oral vancomycin and d/c IV flagyl. We will continue vancomycin enemas for one week and continue rifaximin for one week.   Hypokalemia. repleted.   Hypothyroidism. TSH within normal limits at 0.696. Continue thyroid replacement therapy.  Deconditioning. Patient deconditioned, continues to have generalized weakness, physical therapy consulted. May benefit from SNF. But here at the hospital, she has refused multiple times to participate in the therapy. She said she is sick of this place and  wanted to go the SNF as soon as possible.    Procedures:  None  Antibiotics:   IV Vancomycin (discontinued on 08/28/2013)  IV  Zosyn (discontinued on 08/28/2013)  Oral Vancomycin (started on 08/28/13)  Vancomycin enema 4 times daily (started on 09/02/2013)  Flagyl 500 mg IV Q8 hours (started on 09/02/2013) till 09/09/2013   Consultations:  PCCM  ID over the phone.   Discharge Exam: Filed Vitals:   09/09/13 1415  BP: 130/60  Pulse: 78  Temp: 98.6 F (37 C)  Resp: 16  General: Patient is in no acute distress awake alert, states feeling weak  Cardiovascular: Regular rate rhythm normal S1-S2  Respiratory: Normal respiratory effort, lungs are clear to auscultation bilaterally  Abdomen:No abdominal pain. , positive bowel, no peritoneal signs noted  Musculoskeletal: Continues to have some pain to bilateral legs, trace edema to lower extremities.      Discharge Instructions  Discharge Orders   Future Orders Complete By Expires   Discharge instructions  As directed    Comments:     Follow up with PCP in one week.       Medication List         alendronate 70 MG tablet  Commonly known as:  FOSAMAX  Take 70 mg by mouth every 7 (seven) days.     ALPRAZolam 0.5 MG tablet  Commonly known as:  XANAX  Take 1 tablet (0.5 mg total) by mouth 2 (two) times daily as needed for anxiety.     levothyroxine 75 MCG tablet  Commonly known as:  SYNTHROID, LEVOTHROID  Take 75 mcg by mouth daily.     rifaximin 200 MG tablet  Commonly known as:  XIFAXAN  Take 1 tablet (200 mg total) by mouth every 8 (eight) hours.     sodium chloride irrigation 0.9 % SOLN 100 mL with vancomycin 500 MG SOLR 500 mg  Place 500 mg rectally every 6 (six) hours.     tiotropium 18 MCG inhalation capsule  Commonly known as:  SPIRIVA  Place 18 mcg into inhaler and inhale at bedtime.     vancomycin 50 mg/mL oral solution  Commonly known as:  VANCOCIN  Take 2.5 mLs (125 mg total) by mouth 2 (two) times daily.     vancomycin 50 mg/mL oral solution  Commonly known as:  VANCOCIN  Take 2.5 mLs (125 mg total) by mouth daily.  Start  taking on:  09/12/2013     vancomycin 50 mg/mL oral solution  Commonly known as:  VANCOCIN  Take 2.5 mLs (125 mg total) by mouth every other day.  Start taking on:  09/19/2013     vancomycin 50 mg/mL oral solution  Commonly known as:  VANCOCIN  Take 2.5 mLs (125 mg total) by mouth every 3 (three) days.  Start taking on:  09/27/2013       Allergies  Allergen Reactions  . Oxycodone Other (See Comments)    "CX HER TO FELL LIKE SHE IS OUT OF HER BODY"       Follow-up Information   Follow up with [name], MD. Schedule an appointment as soon as possible for a visit in 1 week.   Specialty:  Family Medicine   Contact information:   [address] [place] [address] [phone]        The results of significant diagnostics from this hospitalization (including imaging, microbiology, ancillary and laboratory) are listed below for reference.    Significant Diagnostic Studies:  Dg Chest 2 View  08/31/2013   CLINICAL DATA:  Shortness of breath  EXAM: CHEST  2 VIEW  COMPARISON:  08/27/2013  FINDINGS: The aorta is unfolded and ectatic. Heart size upper limits of normal. Central vascular congestion noted with trace pleural effusions but no overt edema. No new focal pulmonary opacity allowing for technique.  IMPRESSION: Trace effusions and center vascular congestion but overt alveolar edema.   Electronically Signed   By: Christiana PellantGretchen  Green M.D.   On: 08/31/2013 16:30   Dg Chest 2 View  08/27/2013   CLINICAL DATA:  Shortness of breath.  COPD.  Pain.  EXAM: CHEST  2 VIEW  COMPARISON:  08/02/2013, 05/29/2013, 05/27/2013, and 06/09/2013  FINDINGS: Heart size and pulmonary vascularity are normal. Lungs are clear. No acute osseous abnormality.  IMPRESSION: No active cardiopulmonary disease.   Electronically Signed   By: Geanie CooleyJim  Maxwell M.D.   On: 08/27/2013 12:17   Dg Lumbar Spine Complete  08/27/2013   CLINICAL DATA:  Low back pain following injury  EXAM: LUMBAR SPINE  - COMPLETE 4+ VIEW  COMPARISON:  None.  FINDINGS: Vertebral body height is well maintained. Disc space narrowing is noted from the L3-S1. Associated facet hypertrophic changes are seen. No acute abnormality is noted.  IMPRESSION: Degenerative change without acute abnormality.   Electronically Signed   By: Alcide CleverMark  Lukens M.D.   On: 08/27/2013 12:17   Dg Hip Complete Right  08/27/2013   CLINICAL DATA:  Right hip pain following recent injury  EXAM: RIGHT HIP - COMPLETE 2+ VIEW  COMPARISON:  None.  FINDINGS: Pelvic ring is intact. No acute fracture or dislocation is noted. Irregular calcification is noted in the left hemipelvis likely related to a uterine fibroid.  IMPRESSION: No acute abnormality noted.   Electronically Signed   By: Alcide CleverMark  Lukens M.D.   On: 08/27/2013 12:16   Dg Knee 1-2 Views Right  09/02/2013   CLINICAL DATA:  Postoperative pain  EXAM: RIGHT KNEE - 1-2 VIEW  COMPARISON:  None.  FINDINGS: Surgical changes of a prior total knee arthroplasty. No evidence of hardware complication. Alignment appears anatomic. No significant suprapatellar joint effusion. There is some irregularity of the soft tissue in the lateral aspect of the distal thigh.  IMPRESSION: Total knee arthroplasty without evidence of hardware complication or malalignment.  Irregularity of the soft tissues in the lateral aspect of the distal thigh. Query clinical signs of hematoma, cellulitis or edema?   Electronically Signed   By: Malachy MoanHeath  McCullough M.D.   On: 09/02/2013 09:23    Microbiology: No results found for this or any previous visit (from the past 240 hour(s)).   Labs: Basic Metabolic Panel:  Recent Labs Lab 09/03/13 0330 09/05/13 0335 09/06/13 0348 09/09/13 0408  NA 137 139 136* 138  K 3.2* 3.5* 4.4 3.7  CL 102 103 102 103  CO2 26 23 22 22   GLUCOSE 94 96 100* 88  BUN 3* 4* 4* 9  CREATININE 0.61 0.61 0.63 0.68  CALCIUM 7.9* 8.5 8.7 8.7  MG  --  2.2  --   --    Liver Function Tests: No results found for this  basename: AST, ALT, ALKPHOS, BILITOT, PROT, ALBUMIN,  in the last 168 hours No results found for this basename: LIPASE, AMYLASE,  in the last 168 hours No results found for this basename: AMMONIA,  in the last 168 hours CBC:  Recent Labs Lab 09/03/13 0330 09/09/13 0408  WBC 7.3 8.1  HGB 10.9* 11.8*  HCT 32.7* 36.2  MCV 87.0 87.9  PLT 276 420*   Cardiac Enzymes: No results found for this basename: CKTOTAL, CKMB, CKMBINDEX, TROPONINI,  in the last 168 hours BNP: BNP (last 3 results)  Recent Labs  05/28/13 0545 08/27/13 1110 08/31/13 1347  PROBNP 1179.0* 208.9* 874.7*   CBG: No results found for this basename: GLUCAP,  in the last 168 hours     Signed:  Nikhita Mentzel  [facility] 09/09/2013, 3:39 PM

## 2013-09-09 NOTE — Progress Notes (Signed)
Patient discharged to snf, copies of all discharge medications and instructions reviewed and questions answered. Patient to be transported by ambulance.

## 2013-09-09 NOTE — Progress Notes (Signed)
PT Cancellation Note  Patient Details Name: Amanda SailorsRebekah P Armstrong MRN: [id number] DOB: 23-Feb-1946   Cancelled Treatment:    Reason Eval/Treat Not Completed: Pt refused to participate with PT. Explained to pt that she can get up, even with flexi-seal in place. Pt continues to refuse. Will check back another day.    Rebeca AlertJannie Starlet Gallentine, MPT Pager: [phone]

## 2013-09-09 NOTE — Progress Notes (Signed)
Pt for [facility].  CSW facilitated pt discharge needs including contacting facility, faxing pt discharge information via [facility], discussing with pt at bedside, providing RN phone number to call report, confirming facility received insurance authorization from [employer] and arranging ambulance transport for pt to [facility].  No further social work needs identified at this time.  CSW signing off.   Loletta SpecterSuzanna Tinesha Siegrist, MSW, LCSW Clinical Social Work [phone]

## 2013-09-17 ENCOUNTER — Encounter: Payer: Self-pay | Admitting: Gastroenterology

## 2013-10-29 ENCOUNTER — Other Ambulatory Visit (HOSPITAL_COMMUNITY): Payer: Self-pay | Admitting: Internal Medicine

## 2013-10-29 DIAGNOSIS — W19XXXA Unspecified fall, initial encounter: Secondary | ICD-10-CM

## 2013-11-01 ENCOUNTER — Ambulatory Visit (HOSPITAL_COMMUNITY)
Admission: RE | Admit: 2013-11-01 | Discharge: 2013-11-01 | Disposition: A | Discharge status: Home / Self Care | Payer: Medicare HMO | Source: Ambulatory Visit | Attending: Internal Medicine | Admitting: Internal Medicine

## 2013-11-01 DIAGNOSIS — I6789 Other cerebrovascular disease: Secondary | ICD-10-CM | POA: Insufficient documentation

## 2013-11-01 DIAGNOSIS — G319 Degenerative disease of nervous system, unspecified: Secondary | ICD-10-CM | POA: Insufficient documentation

## 2013-11-01 DIAGNOSIS — R51 Headache: Secondary | ICD-10-CM | POA: Insufficient documentation

## 2013-11-01 DIAGNOSIS — Z8673 Personal history of transient ischemic attack (TIA), and cerebral infarction without residual deficits: Secondary | ICD-10-CM | POA: Insufficient documentation

## 2013-11-01 DIAGNOSIS — S0990XA Unspecified injury of head, initial encounter: Secondary | ICD-10-CM | POA: Insufficient documentation

## 2013-11-01 DIAGNOSIS — W19XXXA Unspecified fall, initial encounter: Secondary | ICD-10-CM | POA: Insufficient documentation

## 2014-07-25 ENCOUNTER — Other Ambulatory Visit (HOSPITAL_COMMUNITY): Payer: Self-pay | Admitting: Internal Medicine

## 2014-07-25 DIAGNOSIS — M810 Age-related osteoporosis without current pathological fracture: Secondary | ICD-10-CM

## 2014-08-04 ENCOUNTER — Ambulatory Visit (HOSPITAL_COMMUNITY)
Admission: RE | Admit: 2014-08-04 | Discharge: 2014-08-04 | Disposition: A | Discharge status: Home / Self Care | Payer: Medicare HMO | Source: Ambulatory Visit | Attending: Internal Medicine | Admitting: Internal Medicine

## 2014-08-04 DIAGNOSIS — Z780 Asymptomatic menopausal state: Secondary | ICD-10-CM | POA: Diagnosis not present

## 2014-08-04 DIAGNOSIS — Z87891 Personal history of nicotine dependence: Secondary | ICD-10-CM | POA: Diagnosis not present

## 2014-08-04 DIAGNOSIS — Z13820 Encounter for screening for osteoporosis: Secondary | ICD-10-CM | POA: Insufficient documentation

## 2014-08-04 DIAGNOSIS — M810 Age-related osteoporosis without current pathological fracture: Secondary | ICD-10-CM | POA: Diagnosis not present

## 2014-11-04 ENCOUNTER — Ambulatory Visit (INDEPENDENT_AMBULATORY_CARE_PROVIDER_SITE_OTHER): Payer: Medicare HMO | Admitting: Neurology

## 2014-11-04 ENCOUNTER — Encounter: Payer: Self-pay | Admitting: Neurology

## 2014-11-04 VITALS — BP 107/63 | HR 71 | Temp 97.2°F | Resp 16 | Ht 59.0 in | Wt 156.0 lb

## 2014-11-04 DIAGNOSIS — J449 Chronic obstructive pulmonary disease, unspecified: Secondary | ICD-10-CM | POA: Diagnosis not present

## 2014-11-04 DIAGNOSIS — G4719 Other hypersomnia: Secondary | ICD-10-CM | POA: Diagnosis not present

## 2014-11-04 DIAGNOSIS — IMO0001 Reserved for inherently not codable concepts without codable children: Secondary | ICD-10-CM

## 2014-11-04 DIAGNOSIS — R0683 Snoring: Secondary | ICD-10-CM | POA: Diagnosis not present

## 2014-11-04 DIAGNOSIS — G4734 Idiopathic sleep related nonobstructive alveolar hypoventilation: Secondary | ICD-10-CM

## 2014-11-04 NOTE — Patient Instructions (Signed)

## 2014-11-04 NOTE — Progress Notes (Signed)
Subjective:    Patient ID: Amanda Armstrong is a 69 y.o. female.  HPI      Dear Dr. Sherwood Gambler,    I saw your patient, Amanda Armstrong, upon your kind request in my neurologic clinic today for initial consultation of her sleep disorder, concern for underlying obstructive sleep apnea. The patient is accompanied by her husband and her second oldest daughter, Amanda Armstrong, today. As you know, Amanda Armstrong is a 69 year old right-handed woman with an underlying complex medical history of hypothyroidism, anxiety, UTI, tachycardia, arthritis, and hospitalizations a year ago for C. difficile colitis and complications from colitis and hypovolemic shock, who reports excessive daytime somnolence and snoring. I have previously seen her on 07/09/2013 for her gait disorder, at which time I felt she had a multifactorial gait disorder.   Today, 11/04/2014: She is able to provide her own history for the most part. Her husband and Amanda Armstrong provide additional information. She has fallen, and has a cane and walker available. She still smokes, 1 ppd, down from 2 ppd. She has an overnight pulse oximetry on 09/30/14, which I reviewed: Total recording time was 9 hours and 41 minutes. Average oxygen saturation was only 89.1%, nadir was 82%. Average pulse rate was 74, lowest was 53. Time below 89% saturation was 3 hours and 15 minutes.  She had a CTH wo contrast on 11/01/13, after a fall when she was in rehabilitation: 1. No acute intracranial abnormalities. No change from the prior study. 2. 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  I personally reviewed the images through the PACS system.   She usually goes to bed around 10 PM and falls asleep within 20 minutes. Her rise time 7 AM but she does not wake up rested. She denies morning headaches and nocturia. Her Epworth sleepiness score is 8 out of 24 and fatigue score is 38. She denies restless leg symptoms. She snores. She has had  some pauses in her breathing.   Previously:   She was admitted to [facility] hospital in November of this year with generalized weakness lasting several days. She had been seen previously for similar symptoms a few weeks prior. She had a brain MRI on 04/26/2013: Exam is motion degraded. No acute infarct. No intracranial hemorrhage. Mild to moderate small vessel disease type changes. Major intracranial vascular structures are patent. Global atrophy. Ventricular prominence probably related to atrophy although difficult to completely exclude a mild component of hydrocephalus. No intracranial mass lesion noted on this unenhanced exam. Cervical spondylotic changes C3-4 level cord flattening. Congenital fusion C2 and C3. Mild exophthalmos. I personally reviewed those films through the PACS system.   She also had additional workup at [facility]. She was placed on Plavix in addition to aspirin. I do have the discharge summary from 05/27/2013 through 05/31/2013. Principal diagnosis was sepsis at the time, d/t UTI. She was discharged to SNF.   She was then admitted again to [facility] on 06/11/2012 with altered mental status. She had brain MRI which showed moderate cerebral atrophy with mild to moderate chronic microvascular changes and she had carotid Doppler studies which did not show any hemodynamically significant ICA stenoses. I reviewed her brain MRI myself on CD. She was treated with IV fluids and was discharged back to nursing home, at [facility], where she stayed for 3 weeks, discharged about 2 weeks. She was told that she may have had a TIA. She reports feeling better, she still takes ASA and Plavix. She  says, that the second time she was admitted, she was not getting her xanax. She is taking Xanax 1 mg qHS. She is getting outpatient OT and will finish next week. She finished outpt PT and walks without a cane or walker. She feels at baseline. She snores mildly intermittently and no apneas  are reported nor gasping sensations for air.   Her Past Medical History Is Significant For: Past Medical History  Diagnosis Date  . Hypothyroidism   . Anxiety   . Arthritis     Bilateral Knee DJD  . Headache(784.0)     SINUS HEADACHE OCCASSIONALLY  . Vertigo   . Multifactorial gait disorder   . Clostridium difficile carrier     pt stated that she has intermittent sx  . Osteoporosis   . Chronic pain   . Hypertension     Her Past Surgical History Is Significant For: Past Surgical History  Procedure Laterality Date  . Joint replacement  11/08/3010    right total knee  . Tubal ligation  1982  . Total knee arthroplasty  11/07/2011    Procedure: TOTAL KNEE ARTHROPLASTY;  Surgeon: Nilda Simmerobert A Wainer, MD;  [facility];  Service: Orthopedics;  Laterality: Left;  DR Thurston HoleWAINER WANTS 90 MINUTES FOR THIS CASE    Her Family History Is Significant For: Family History  Problem Relation Age of Onset  . Heart attack Mother   . COPD Father   . Arthritis Sister   . Heart disease Brother     Her Social History Is Significant For: History   Social History  . Marital Status: Married    Spouse Name: Amanda FearingJames  . Number of Children: 4  . Years of Education: [employer] Sch[employer]   Social History Main Topics  . Smoking status: Current Every Day Smoker -- 1.00 packs/day for 48 years    Types: Cigarettes    Last Attempt to Quit: 06/15/2012  . Smokeless tobacco: Never Used  . Alcohol Use: No  . Drug Use: No  . Sexual Activity: No   Other Topics Concern  . None   Social History Narrative   1 cup of coffee a day     Her Allergies Are:  Allergies  Allergen Reactions  . Iron Other (See Comments)    "upset stomach"  . Oxycodone Other (See Comments)    "CX HER TO FELL LIKE SHE IS OUT OF HER BODY"  :   Her Current Medications Are:  Outpatient Encounter Prescriptions as of 11/04/2014  Medication Sig  . alendronate (FOSAMAX) 70 MG tablet Take 70 mg by mouth every 7 (seven) days.   . ALPRAZolam  (XANAX) 1 MG tablet   . fexofenadine (ALLEGRA) 180 MG tablet Take 180 mg by mouth daily.  Marland Kitchen. levothyroxine (SYNTHROID, LEVOTHROID) 88 MCG tablet   :  Review of Systems:  Out of a complete 14 point review of systems, all are reviewed and negative with the exception of these symptoms as listed below:   Review of Systems  Constitutional: Positive for fatigue.  Respiratory: Positive for shortness of breath.        Snoring  Cardiovascular: Positive for leg swelling.  Allergic/Immunologic: Positive for environmental allergies.  Neurological:       Memory loss, Weakness, Sleepiness, Snoring  Psychiatric/Behavioral:       Decreased energy     Objective:  Neurologic Exam  Physical Exam Physical Examination:   Filed Vitals:   11/04/14 0857  BP: 107/63  Pulse: 71  Temp: 97.2 F (36.2  C)  Resp: 16   General Examination: The patient is a very pleasant 69 y.o. female in no acute distress. She appears well-developed and well-nourished and well groomed. She is overweight.   HEENT: Normocephalic, atraumatic, pupils are equal, round and reactive to light and accommodation. Funduscopic exam is normal with sharp disc margins noted. Extraocular tracking is good without limitation to gaze excursion or nystagmus noted. Normal smooth pursuit is noted. Hearing is grossly intact. Tympanic membranes are clear bilaterally. Face is symmetric with normal facial animation and normal facial sensation. Speech is clear with no dysarthria noted. There is no hypophonia. There is no lip, neck/head, jaw or voice tremor. Neck is supple with full range of passive and active motion. There are no carotid bruits on auscultation. Oropharynx exam reveals: mild mouth dryness, adequate dental hygiene and mild airway crowding, due to narrow airway. Mallampati is class II. Tongue protrudes centrally and palate elevates symmetrically.    Chest: Clear to auscultation without wheezing, rhonchi or crackles noted.  Heart: S1+S2+0,  regular and normal without murmurs, rubs or gallops noted.   Abdomen: Soft, non-tender and non-distended with normal bowel sounds appreciated on auscultation.  Extremities: There is trace pitting edema in the distal lower extremities bilaterally. Pedal pulses are intact.  Skin: Warm and dry without trophic changes noted.   Musculoskeletal: exam reveals no obvious joint deformities, tenderness or joint swelling or erythema, She reports right knee pain.   Neurologically:  Mental status: The patient is awake, alert and oriented in all 4 spheres. Her memory, attention, language and knowledge are appropriate. There is no aphasia, agnosia, apraxia or anomia. Speech is clear with normal prosody and enunciation. Thought process is linear. Mood is congruent and affect is normal.  Cranial nerves are as described above under HEENT exam. In addition, shoulder shrug is normal with equal shoulder height noted. Motor exam: Normal bulk, strength and tone is noted. There is no drift, tremor or rebound. Romberg is negative. Reflexes are 2+ throughout. Toes are downgoing bilaterally. Fine motor skills are intact with normal finger taps, normal hand movements, normal rapid alternating patting, normal foot taps and normal foot agility.  Cerebellar testing shows no dysmetria or intention tremor on finger to nose testing. Heel to shin is unremarkable bilaterally. There is no truncal or gait ataxia.  Sensory exam is intact to light touch, pinprick, vibration, temperature sense in the upper and lower extremities.  Gait, station and balance: She stands up well but stands slightly wide-based. She walks independently with preserved arm swing but slightly cautiously. She turns in 3 steps. Tandem walk is difficult for her and she's not able to do this completely on her own, unchanged.  Assessment and Plan:   In summary, Koni P Olmo is a very pleasant 69 year old female with an underlying complex medical history of  hypothyroidism, anxiety, UTI, smoking, COPD, tachycardia, arthritis, and hospitalizations a year ago for C. difficile colitis and complications from colitis and hypovolemic shock, who reports excessive daytime somnolence and snoring. Her gait disorder is stable. Her history and physical exam are somewhat concerning for underlying obstructive sleep apnea. We talked about OSA its prognosis and treatment options. She would be willing to try CPAP machine. We talked about her recent pulse oximetry test results. She has an underlying history of COPD and in the context of ongoing smoking, her below normal average oxygen saturation is in sleep may be related to her chronic lung disease and smoking. She is advised to quit smoking. I suggested  we pursue a sleep study and talked to her about the CPAP treatment option as well. Her daughter will bring her for the sleep study and pick her up.  I will see her back once the sleep studies completed.  I spent 25 minutes in total face-to-face time with the patient, more than 50% of which was spent in counseling and coordination of care, reviewing test results, reviewing medication and discussing or reviewing the diagnosis of OSA, its prognosis and treatment options.  Thank you very much for allowing me to participate in the care of this nice patient. If I can be of any further assistance to you please do not hesitate to call me at [phone].  Sincerely,   [name], MD, PhD

## 2014-12-04 ENCOUNTER — Ambulatory Visit (INDEPENDENT_AMBULATORY_CARE_PROVIDER_SITE_OTHER): Payer: Medicare HMO | Admitting: Neurology

## 2014-12-04 VITALS — BP 126/68 | HR 80 | Ht 59.0 in | Wt 156.0 lb

## 2014-12-04 DIAGNOSIS — G4761 Periodic limb movement disorder: Secondary | ICD-10-CM

## 2014-12-04 DIAGNOSIS — G4733 Obstructive sleep apnea (adult) (pediatric): Secondary | ICD-10-CM | POA: Diagnosis not present

## 2014-12-04 DIAGNOSIS — G4734 Idiopathic sleep related nonobstructive alveolar hypoventilation: Secondary | ICD-10-CM

## 2014-12-04 DIAGNOSIS — G4720 Circadian rhythm sleep disorder, unspecified type: Secondary | ICD-10-CM

## 2014-12-04 DIAGNOSIS — G479 Sleep disorder, unspecified: Secondary | ICD-10-CM

## 2014-12-05 NOTE — Sleep Study (Signed)
See scanned documents in Encounters tab 

## 2014-12-12 ENCOUNTER — Telehealth: Payer: Self-pay | Admitting: Neurology

## 2014-12-12 NOTE — Telephone Encounter (Signed)
Please call and notify the patient that the recent sleep study did not show any significant obstructive sleep apnea. Please inform patient that I would like to go over the details of the study during a follow up appointment. Pls arrange a followup appointment, 30 min if possible. Also, route or fax report to PCP and referring MD, if other than PCP.  Once you have spoken to patient, you can close this encounter.   Thanks,  Huston FoleySaima Phelan Schadt, MD, PhD [facility])

## 2014-12-16 NOTE — Telephone Encounter (Signed)
I spoke to patient. She is aware of results and we were able to schedule appt in June per patient request.

## 2014-12-29 ENCOUNTER — Ambulatory Visit (INDEPENDENT_AMBULATORY_CARE_PROVIDER_SITE_OTHER): Payer: Medicare HMO | Admitting: Neurology

## 2014-12-29 ENCOUNTER — Encounter: Payer: Self-pay | Admitting: Neurology

## 2014-12-29 VITALS — BP 111/67 | HR 90

## 2014-12-29 DIAGNOSIS — R2689 Other abnormalities of gait and mobility: Secondary | ICD-10-CM | POA: Diagnosis not present

## 2014-12-29 DIAGNOSIS — G4734 Idiopathic sleep related nonobstructive alveolar hypoventilation: Secondary | ICD-10-CM

## 2014-12-29 DIAGNOSIS — Z720 Tobacco use: Secondary | ICD-10-CM

## 2014-12-29 DIAGNOSIS — R600 Localized edema: Secondary | ICD-10-CM

## 2014-12-29 DIAGNOSIS — F17200 Nicotine dependence, unspecified, uncomplicated: Secondary | ICD-10-CM

## 2014-12-29 NOTE — Progress Notes (Deleted)
Subjective:    Patient ID: Amanda Armstrong is a 69 y.o. female.  HPI {Common ambulatory SmartLinks:[id number]}  Review of Systems  Objective:  Neurologic Exam  Physical Exam  Assessment:   ***  Plan:   ***

## 2014-12-29 NOTE — Patient Instructions (Addendum)
I would like for you to see your primary care physician as soon as possible for new right leg swelling and possible fluid in the lung basis.   As far as your sleep study: It did not show any significant obstructive sleep apnea. You have mild sleep apnea when in dream sleep. Overall, your average oxygen saturation was below normal. Please stop smoking, try to sleep on your sides and stop smoking. You may benefit from seeing a lung doctor. Talk to Dr. Sherwood Gambler about it. I can see you back as needed.   Remember to drink plenty of fluid, eat healthy meals and do not skip any meals. Try to eat protein with a every meal and eat a healthy snack such as fruit or nuts in between meals. Try to keep a regular sleep-wake schedule and try to exercise daily, particularly in the form of walking, 10-20 minutes a day, if you can. Use your walker all all times. Try to avoid sugary drinks and drink more water! Your blood sugar may be fluctuating too much.

## 2014-12-29 NOTE — Progress Notes (Signed)
Subjective:    Patient ID: Amanda Armstrong is a 69 y.o. female.  HPI     Interim history:   Amanda Armstrong is a 69 year old right-handed woman with an underlying complex medical history of hypothyroidism, anxiety, UTI, tachycardia, arthritis, and hospitalizations a year ago for C. difficile colitis and complications from colitis and hypovolemic shock, who presents for follow-up consultation after her recent sleep study. The patient is accompanied by her husband and her daughter, Amanda Armstrong, today. I last saw her on 11/04/2014, at which time her primary care physician referred her for evaluation of her snoring and daytime somnolence. Prior to that I saw her in December 2014 for a gait disorder. I invited her back for sleep study. She had a baseline sleep study on 12/04/2014 and underwent over her test results with her in detail today. Her sleep efficiency was reduced at 77.2% with a latency to sleep of 16 minutes and wake after sleep onset of 69 minutes with mild sleep fragmentation noted and difficulty returning back to sleep after 4 AM. She had a normal arousal index. She had normal percentages of stage I and stage II sleep, an increased percentage of slow-wave sleep, and a decreased percentage of REM sleep with a REM latency of 113 minutes, she had mild PLMS at 22.7 per hour, resulting and minimal or negligible arousals. She had mild intermittent snoring. Total AHI was borderline at 4.6 per hour, rising to 12.4/h during REM sleep. Supine sleep was not achieved. Average oxygen saturation was 91%, nadir was 86%. Time below 90% saturation was 48 minutes, time below 88% saturation was 12 minutes and 31 seconds.  Today, 12/29/2014: She reports that she was diagnosed with COPD but has not seen a lung doctor. She still smokes, approximately half a pack per day. She almost fell 2 days ago. They were outside at a restaurant. She had not taken her walker with her. She does not tend to eat very regularly. Sometimes she  drinks of protein milkshake in between. She does not like to drink water. She likes to drink fruit punch which contains sugar.  She has some leg swelling which is new. For the past month she has noted right leg swelling. She denies any shortness of breath. Her husband adds that she does not seem to have as much energy. She is easily tired. She denies any tenderness in her right leg. She denies restless leg symptoms.  Previously:   She has fallen, and has a cane and walker available. She still smokes, 1 ppd, down from 2 ppd. She has an overnight pulse oximetry on 09/30/14, which I reviewed: Total recording time was 9 hours and 41 minutes. Average oxygen saturation was only 89.1%, nadir was 82%. Average pulse rate was 74, lowest was 53. Time below 89% saturation was 3 hours and 15 minutes.  She had a CTH wo contrast on 11/01/13, after a fall when she was in rehabilitation: 1. No acute intracranial abnormalities. No change from the prior study. 2. 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  I personally reviewed the images through the PACS system.   She usually goes to bed around 10 PM and falls asleep within 20 minutes. Her rise time 7 AM but she does not wake up rested. She denies morning headaches and nocturia. Her Epworth sleepiness score is 8 out of 24 and fatigue score is 38. She denies restless leg symptoms. She snores. She has had some pauses in her breathing.  Previously:   She was admitted to [facility] hospital in November of this year with generalized weakness lasting several days. She had been seen previously for similar symptoms a few weeks prior. She had a brain MRI on 04/26/2013: Exam is motion degraded. No acute infarct. No intracranial hemorrhage. Mild to moderate small vessel disease type changes. Major intracranial vascular structures are patent. Global atrophy. Ventricular prominence probably related to atrophy although difficult to  completely exclude a mild component of hydrocephalus. No intracranial mass lesion noted on this unenhanced exam. Cervical spondylotic changes C3-4 level cord flattening. Congenital fusion C2 and C3. Mild exophthalmos. I personally reviewed those films through the PACS system.   She also had additional workup at [facility]. She was placed on Plavix in addition to aspirin. I do have the discharge summary from 05/27/2013 through 05/31/2013. Principal diagnosis was sepsis at the time, d/t UTI. She was discharged to SNF.   She was then admitted again to [facility] on 06/11/2012 with altered mental status. She had brain MRI which showed moderate cerebral atrophy with mild to moderate chronic microvascular changes and she had carotid Doppler studies which did not show any hemodynamically significant ICA stenoses. I reviewed her brain MRI myself on CD. She was treated with IV fluids and was discharged back to nursing home, at [facility], where she stayed for 3 weeks, discharged about 2 weeks. She was told that she may have had a TIA. She reports feeling better, she still takes ASA and Plavix. She says, that the second time she was admitted, she was not getting her xanax. She is taking Xanax 1 mg qHS. She is getting outpatient OT and will finish next week. She finished outpt PT and walks without a cane or walker. She feels at baseline. She snores mildly intermittently and no apneas are reported nor gasping sensations for air.   Her Past Medical History Is Significant For: Past Medical History  Diagnosis Date  . Hypothyroidism   . Anxiety   . Arthritis     Bilateral Knee DJD  . Headache(784.0)     SINUS HEADACHE OCCASSIONALLY  . Vertigo   . Multifactorial gait disorder   . Clostridium difficile carrier     pt stated that she has intermittent sx  . Osteoporosis   . Chronic pain   . Hypertension     Her Past Surgical History Is Significant For: Past Surgical History  Procedure  Laterality Date  . Joint replacement  11/08/3010    right total knee  . Tubal ligation  1982  . Total knee arthroplasty  11/07/2011    Procedure: TOTAL KNEE ARTHROPLASTY;  Surgeon: Nilda Simmer, MD;  Location: [facility] OR;  Service: Orthopedics;  Laterality: Left;  DR Thurston Hole WANTS 90 MINUTES FOR THIS CASE    Her Family History Is Significant For: Family History  Problem Relation Age of Onset  . Heart attack Mother   . COPD Father   . Arthritis Sister   . Heart disease Brother     Her Social History Is Significant For: History   Social History  . Marital Status: Married    Spouse Name: Fayrene Fearing  . Number of Children: 4  . Years of Education: [employer]   Social History Main Topics  . Smoking status: Current Every Day Smoker -- 1.00 packs/day for 48 years    Types: Cigarettes    Last Attempt to Quit: 06/15/2012  . Smokeless tobacco: Never Used     Comment: 12/29/14 1/2 PPD  .  Alcohol Use: No  . Drug Use: No  . Sexual Activity: No   Other Topics Concern  . None   Social History Narrative   1 cup of coffee a day     Her Allergies Are:  Allergies  Allergen Reactions  . Iron Other (See Comments)    "upset stomach"  . Oxycodone Other (See Comments)    "CX HER TO FELL LIKE SHE IS OUT OF HER BODY"  :   Her Current Medications Are:  Outpatient Encounter Prescriptions as of 12/29/2014  Medication Sig  . alendronate (FOSAMAX) 70 MG tablet Take 70 mg by mouth every 7 (seven) days.   . ALPRAZolam (XANAX) 1 MG tablet   . levothyroxine (SYNTHROID, LEVOTHROID) 88 MCG tablet   . [DISCONTINUED] fexofenadine (ALLEGRA) 180 MG tablet Take 180 mg by mouth daily.   No facility-administered encounter medications on file as of 12/29/2014.  :  Review of Systems:  Out of a complete 14 point review of systems, all are reviewed and negative with the exception of these symptoms as listed below:   Review of Systems  All other systems reviewed and are negative.   Objective:  Neurologic  Exam  Physical Exam Physical Examination:   Filed Vitals:   12/29/14 1309  BP: 111/67  Pulse: 90   General Examination: The patient is a very pleasant 69 y.o. female in no acute distress. She appears well-developed and well-nourished and well groomed. She is overweight. She appears more deconditioned and is situated in a clinic wheelchair. She did not bring her walker today.  HEENT: Normocephalic, atraumatic, pupils are equal, round and reactive to light and accommodation. Funduscopic exam is normal with sharp disc margins noted. Extraocular tracking is good without limitation to gaze excursion or nystagmus noted. Normal smooth pursuit is noted. Hearing is grossly intact. Tympanic membranes are clear bilaterally. Face is symmetric with normal facial animation and normal facial sensation. Speech is clear with no dysarthria noted. There is no hypophonia. There is no lip, neck/head, jaw or voice tremor. Neck is supple with full range of passive and active motion. There are no carotid bruits on auscultation. Oropharynx exam reveals: mild to moderate mouth dryness, adequate dental hygiene and mild airway crowding, due to narrow airway. Mallampati is class II. Tongue protrudes centrally and palate elevates symmetrically.    Chest: Clear to auscultation with occasional inspiratory wheeze, and she does have very slight bibasilar crackles.  Heart: S1+S2+0, regular and normal without murmurs, rubs or gallops noted.   Abdomen: Soft, non-tender and non-distended with normal bowel sounds appreciated on auscultation.  Extremities: There is 2+ pitting edema in the right lower extremity up to upper one third of her shin. She has mild erythema of the entire distal right leg. There is no calf tenderness and no tenderness upon moving her ankle. She has trace to 1+ edema in the distal part of the left lower leg up to above the ankle.   Skin: Warm and dry without trophic changes noted.   Musculoskeletal: exam  reveals no obvious joint deformities, tenderness or joint swelling or erythema, She reports right knee pain.   Neurologically:  Mental status: The patient is awake, alert and oriented in all 4 spheres. Her memory, attention, language and knowledge are appropriate. There is no aphasia, agnosia, apraxia or anomia. Speech is clear with normal prosody and enunciation. Thought process is linear. Mood is congruent and affect is  somewhat blunted.   Cranial nerves are as described above under HEENT exam.  In addition, shoulder shrug is normal with equal shoulder height noted. Motor exam: Normal bulk, strength and tone is noted. There is no drift, tremor or rebound. Romberg is negative. Reflexes are 2+ throughout. Fine motor skills are intact with normal finger taps, normal hand movements, normal rapid alternating patting, normal foot taps and normal foot agility.  Cerebellar testing shows no dysmetria or intention tremor on finger to nose testing. Sensory exam is intact to light touch in the upper and lower extremities.  Gait, station and balance: She did not bring her walker today and I did not ask her to stand or walk for me today. Assessment and Plan:   In summary, PAIZLEY PLOTNER is a very pleasant 69 year old female with an underlying complex medical history of hypothyroidism, anxiety, UTI, smoking, COPD, tachycardia, arthritis, and hospitalizations a year ago for C. difficile colitis and complications from colitis and hypovolemic shock, who  presents for follow-up consultation after her recent sleep study in May. Her sleep study did not show any overt obstructive sleep apnea. She has mild obstructive sleep disordered breathing during REM sleep. Her baseline oxygen saturations were below normal at 91% and time below 88% saturation was indicative of mild nocturnal hypoxemia. This is in the context of overweight state and ongoing smoking and a long-standing history of heavy smoking. She had mild periodic leg  movements with negligible arousals. She has on examination mild by basilar crackles and new right lower extremity swelling for the past month. She is advised to follow-up with her primary care physician for these issues. At this juncture, she does not have to be treated with a CPAP machine. She is advised to try to lose weight, sleep on her sides and try to quit smoking. She may benefit from seeing a pulmonologist for her history of COPD. She is encouraged to discuss this with her primary care provider as well. At this juncture, I will see her back on an as-needed basis. I talked to the patient and her family about her lung exam and the new right leg swelling. Again, she is advised to make an appointment with her primary care physician as soon as possible. I answered all their questions today and they were in agreement.  I spent 15 minutes in total face-to-face time with the patient, more than 50% of which was spent in counseling and coordination of care, reviewing test results, reviewing medication and discussing or reviewing the diagnosis of leg edema, nocturnal hypoxemia, smoking, the prognosis and treatment options.

## 2015-01-05 ENCOUNTER — Other Ambulatory Visit (HOSPITAL_COMMUNITY): Payer: Self-pay | Admitting: Family Medicine

## 2015-01-05 DIAGNOSIS — R600 Localized edema: Secondary | ICD-10-CM

## 2015-01-07 ENCOUNTER — Encounter (INDEPENDENT_AMBULATORY_CARE_PROVIDER_SITE_OTHER): Payer: Self-pay | Admitting: *Deleted

## 2015-01-07 ENCOUNTER — Ambulatory Visit (HOSPITAL_COMMUNITY)
Admission: RE | Admit: 2015-01-07 | Discharge: 2015-01-07 | Disposition: A | Discharge status: Home / Self Care | Payer: Medicare HMO | Source: Ambulatory Visit | Attending: Family Medicine | Admitting: Family Medicine

## 2015-01-07 DIAGNOSIS — R600 Localized edema: Secondary | ICD-10-CM | POA: Diagnosis present

## 2015-01-27 ENCOUNTER — Other Ambulatory Visit (HOSPITAL_COMMUNITY): Payer: Self-pay | Admitting: Internal Medicine

## 2015-01-27 DIAGNOSIS — Z1231 Encounter for screening mammogram for malignant neoplasm of breast: Secondary | ICD-10-CM

## 2015-02-09 ENCOUNTER — Ambulatory Visit (HOSPITAL_COMMUNITY)
Admission: RE | Admit: 2015-02-09 | Discharge: 2015-02-09 | Disposition: A | Discharge status: Home / Self Care | Payer: Medicare HMO | Source: Ambulatory Visit | Attending: Internal Medicine | Admitting: Internal Medicine

## 2015-02-09 ENCOUNTER — Ambulatory Visit (INDEPENDENT_AMBULATORY_CARE_PROVIDER_SITE_OTHER): Payer: Medicare HMO | Admitting: Internal Medicine

## 2015-02-09 ENCOUNTER — Other Ambulatory Visit (HOSPITAL_COMMUNITY): Payer: Self-pay | Admitting: Internal Medicine

## 2015-02-09 DIAGNOSIS — Z1231 Encounter for screening mammogram for malignant neoplasm of breast: Secondary | ICD-10-CM | POA: Insufficient documentation

## 2015-05-13 DIAGNOSIS — Z23 Encounter for immunization: Secondary | ICD-10-CM | POA: Diagnosis not present

## 2015-05-13 DIAGNOSIS — Z6830 Body mass index (BMI) 30.0-30.9, adult: Secondary | ICD-10-CM | POA: Diagnosis not present

## 2015-05-13 DIAGNOSIS — M810 Age-related osteoporosis without current pathological fracture: Secondary | ICD-10-CM | POA: Diagnosis not present

## 2015-05-13 DIAGNOSIS — Z0000 Encounter for general adult medical examination without abnormal findings: Secondary | ICD-10-CM | POA: Diagnosis not present

## 2015-07-29 IMAGING — CR DG CHEST 2V
2 series · 2 of 2 positions shown · non-contrast
Comparison: Chest x-ray 06/09/2013.

CLINICAL DATA: Weakness.  Fever.

EXAM:
CHEST  2 VIEW

[view not recorded (1 of 2)]
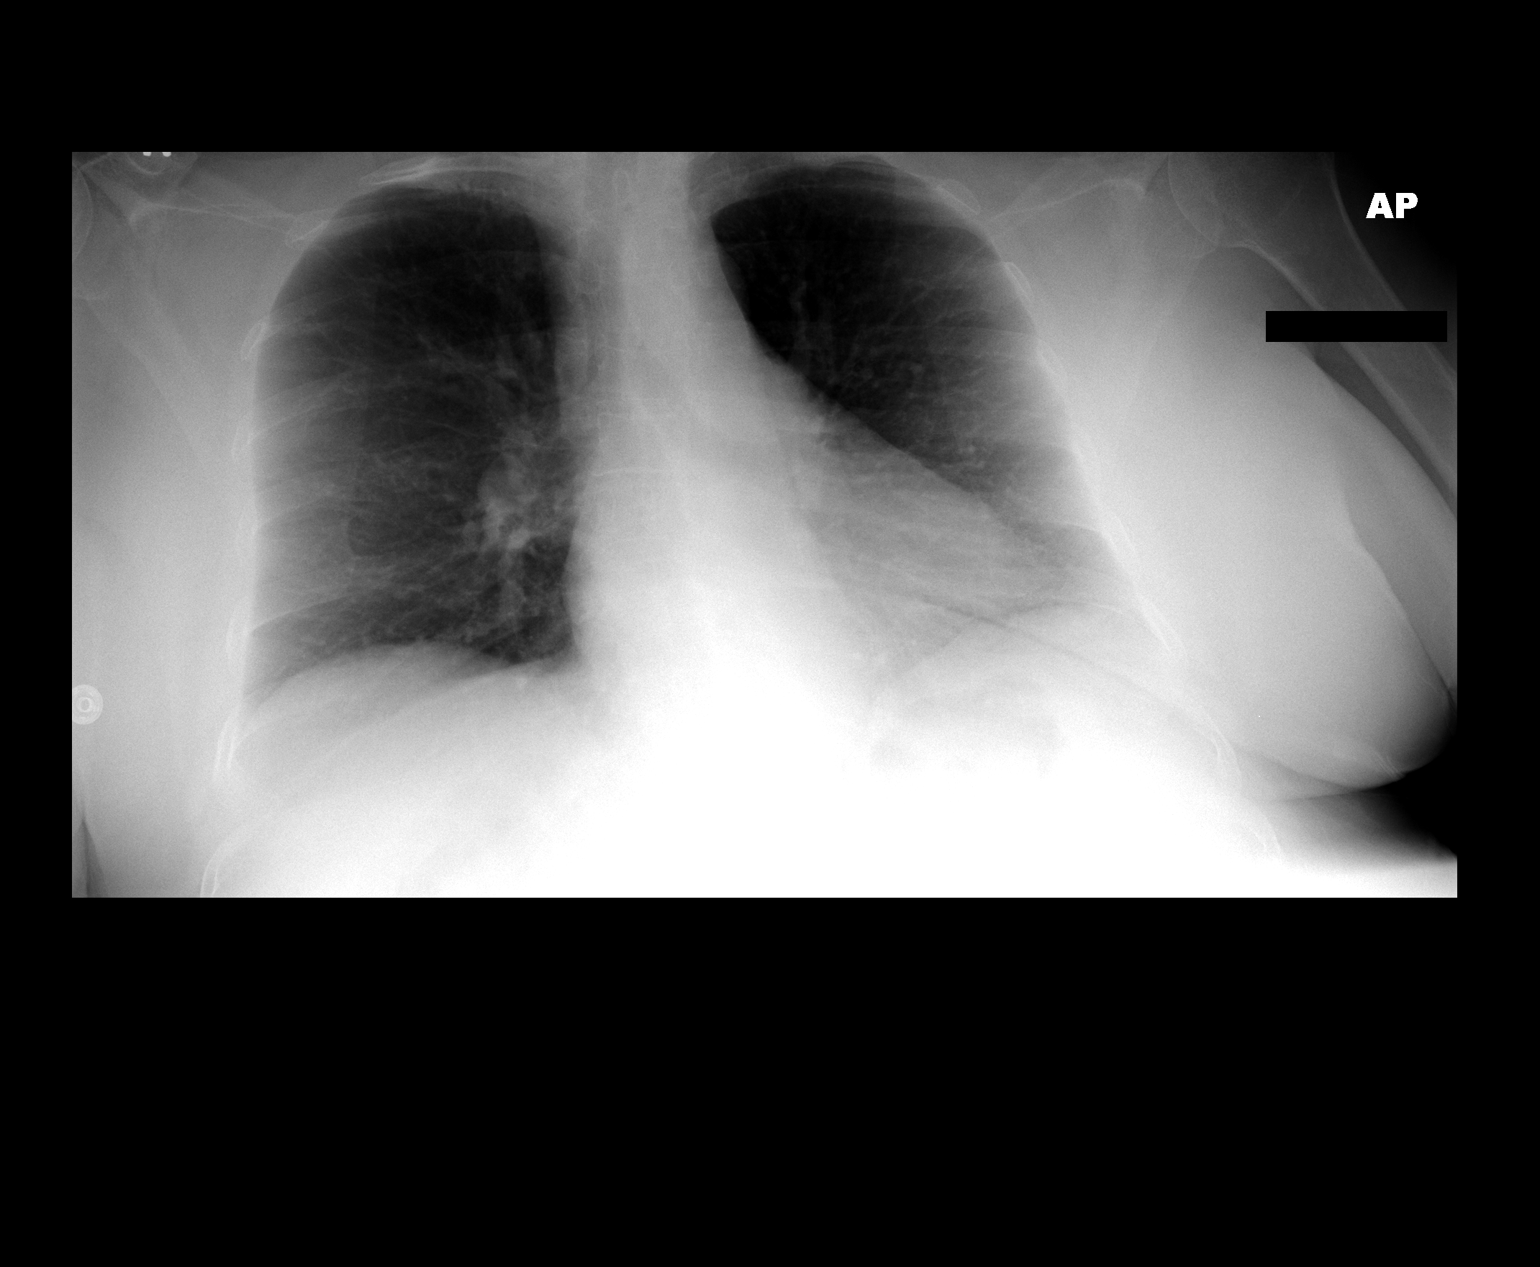

[view not recorded (2 of 2)]
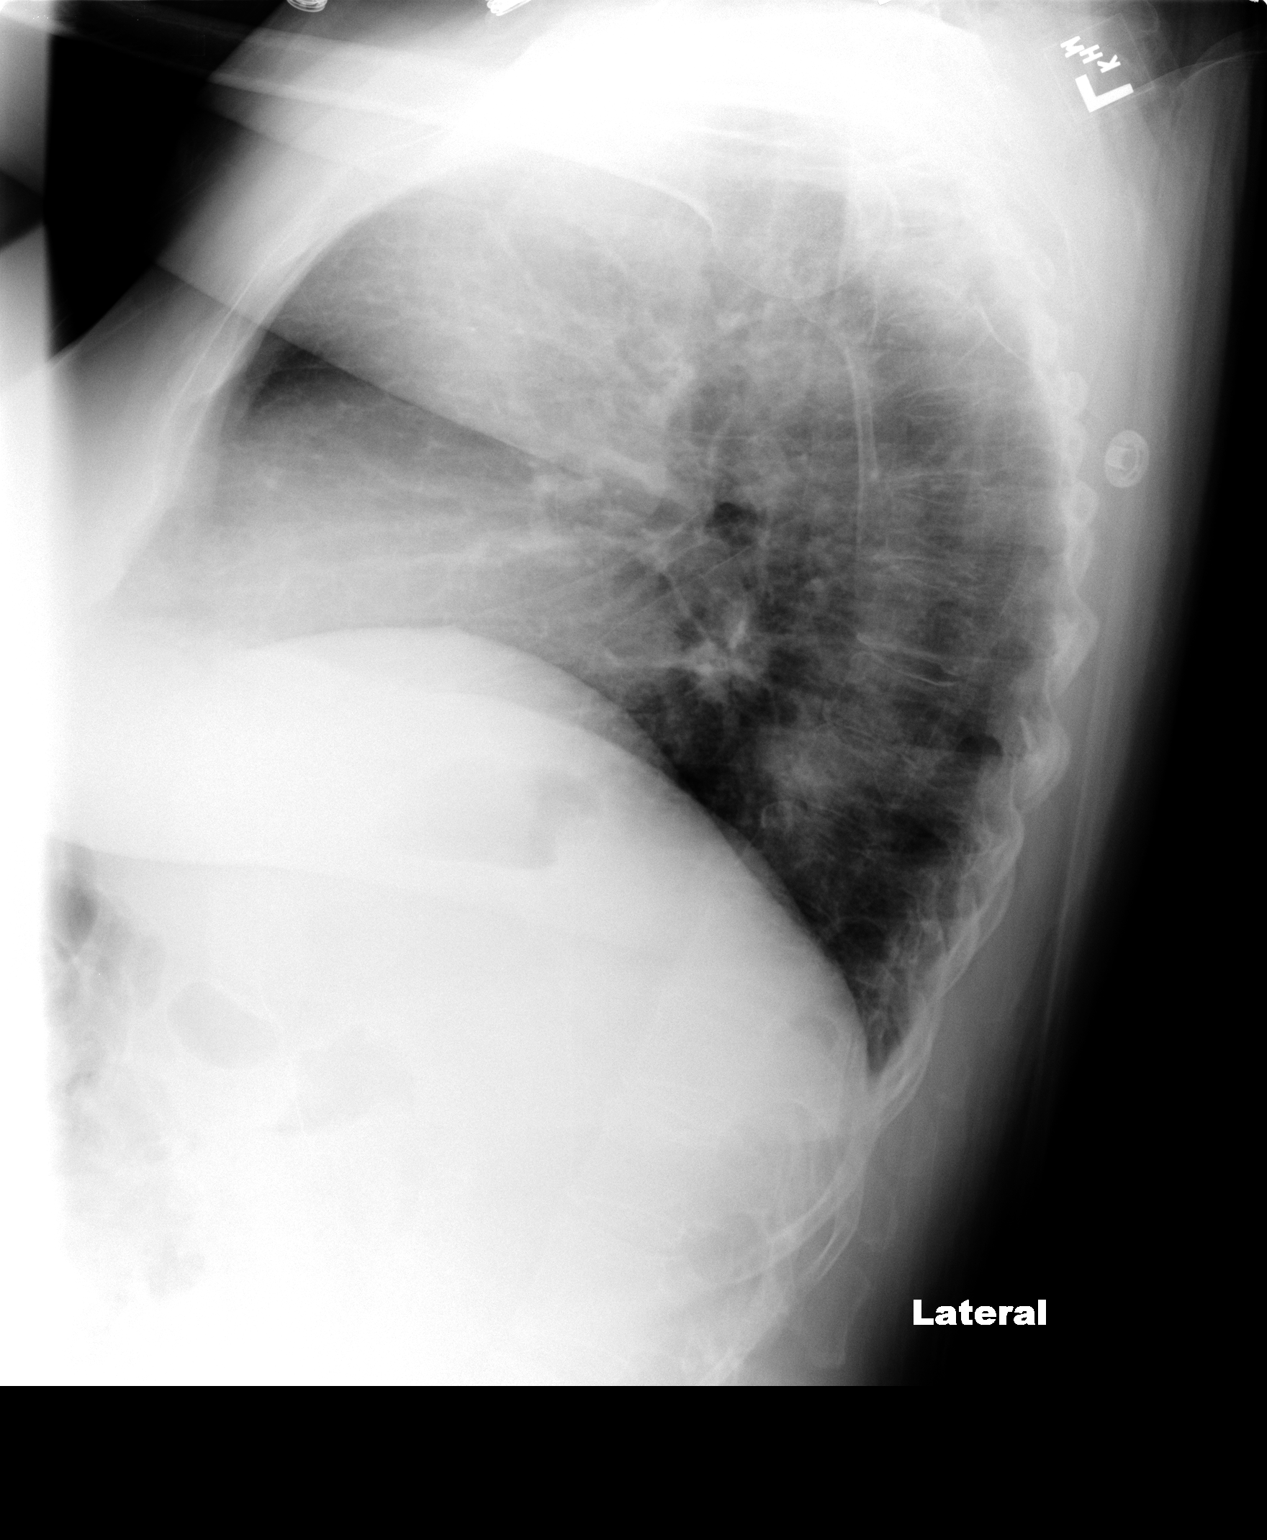

[2 of 2 positions shown; findings below may reference images not displayed]

FINDINGS: Lung volumes are normal. No consolidative airspace disease. No
pleural effusions. No pneumothorax. No pulmonary nodule or mass
noted. Pulmonary vasculature and the cardiomediastinal silhouette
are within normal limits.
IMPRESSION: 1.  No radiographic evidence of acute cardiopulmonary disease.

## 2015-08-06 DIAGNOSIS — Z6831 Body mass index (BMI) 31.0-31.9, adult: Secondary | ICD-10-CM | POA: Diagnosis not present

## 2015-08-06 DIAGNOSIS — E039 Hypothyroidism, unspecified: Secondary | ICD-10-CM | POA: Diagnosis not present

## 2015-08-06 DIAGNOSIS — E781 Pure hyperglyceridemia: Secondary | ICD-10-CM | POA: Diagnosis not present

## 2015-08-06 DIAGNOSIS — R42 Dizziness and giddiness: Secondary | ICD-10-CM | POA: Diagnosis not present

## 2015-08-06 DIAGNOSIS — E063 Autoimmune thyroiditis: Secondary | ICD-10-CM | POA: Diagnosis not present

## 2015-08-06 DIAGNOSIS — R739 Hyperglycemia, unspecified: Secondary | ICD-10-CM | POA: Diagnosis not present

## 2015-08-06 DIAGNOSIS — J449 Chronic obstructive pulmonary disease, unspecified: Secondary | ICD-10-CM | POA: Diagnosis not present

## 2015-08-06 DIAGNOSIS — G894 Chronic pain syndrome: Secondary | ICD-10-CM | POA: Diagnosis not present

## 2015-08-06 DIAGNOSIS — Z1389 Encounter for screening for other disorder: Secondary | ICD-10-CM | POA: Diagnosis not present

## 2015-08-06 DIAGNOSIS — R5383 Other fatigue: Secondary | ICD-10-CM | POA: Diagnosis not present

## 2015-09-07 ENCOUNTER — Emergency Department (HOSPITAL_COMMUNITY): Payer: Medicare HMO

## 2015-09-07 ENCOUNTER — Inpatient Hospital Stay (HOSPITAL_COMMUNITY)
Admission: EM | Admit: 2015-09-07 | Discharge: 2015-09-09 | DRG: 872 | Disposition: A | Discharge status: Home / Self Care | Payer: Medicare HMO | Attending: Internal Medicine | Admitting: Internal Medicine

## 2015-09-07 ENCOUNTER — Encounter (HOSPITAL_COMMUNITY): Payer: Self-pay | Admitting: *Deleted

## 2015-09-07 DIAGNOSIS — M810 Age-related osteoporosis without current pathological fracture: Secondary | ICD-10-CM | POA: Diagnosis not present

## 2015-09-07 DIAGNOSIS — Z8249 Family history of ischemic heart disease and other diseases of the circulatory system: Secondary | ICD-10-CM

## 2015-09-07 DIAGNOSIS — N390 Urinary tract infection, site not specified: Secondary | ICD-10-CM | POA: Diagnosis not present

## 2015-09-07 DIAGNOSIS — E039 Hypothyroidism, unspecified: Secondary | ICD-10-CM | POA: Diagnosis not present

## 2015-09-07 DIAGNOSIS — I10 Essential (primary) hypertension: Secondary | ICD-10-CM | POA: Diagnosis present

## 2015-09-07 DIAGNOSIS — A419 Sepsis, unspecified organism: Secondary | ICD-10-CM

## 2015-09-07 DIAGNOSIS — Z8261 Family history of arthritis: Secondary | ICD-10-CM | POA: Diagnosis not present

## 2015-09-07 DIAGNOSIS — F17210 Nicotine dependence, cigarettes, uncomplicated: Secondary | ICD-10-CM

## 2015-09-07 DIAGNOSIS — R531 Weakness: Secondary | ICD-10-CM | POA: Diagnosis present

## 2015-09-07 DIAGNOSIS — Z96653 Presence of artificial knee joint, bilateral: Secondary | ICD-10-CM | POA: Diagnosis present

## 2015-09-07 DIAGNOSIS — R69 Illness, unspecified: Secondary | ICD-10-CM | POA: Diagnosis not present

## 2015-09-07 DIAGNOSIS — Z825 Family history of asthma and other chronic lower respiratory diseases: Secondary | ICD-10-CM

## 2015-09-07 DIAGNOSIS — F17200 Nicotine dependence, unspecified, uncomplicated: Secondary | ICD-10-CM | POA: Diagnosis present

## 2015-09-07 DIAGNOSIS — R509 Fever, unspecified: Secondary | ICD-10-CM | POA: Diagnosis not present

## 2015-09-07 HISTORY — DX: Other bacterial infections of unspecified site: A49.8

## 2015-09-07 LAB — URINALYSIS, ROUTINE W REFLEX MICROSCOPIC
Bilirubin Urine: NEGATIVE
Glucose, UA: NEGATIVE mg/dL
Ketones, ur: NEGATIVE mg/dL
NITRITE: POSITIVE — AB
PH: 5.5 (ref 5.0–8.0)
Protein, ur: 100 mg/dL — AB
Specific Gravity, Urine: 1.02 (ref 1.005–1.030)

## 2015-09-07 LAB — CBC
HEMATOCRIT: 45.4 % (ref 36.0–46.0)
Hemoglobin: 15.1 g/dL — ABNORMAL HIGH (ref 12.0–15.0)
MCH: 31.7 pg (ref 26.0–34.0)
MCHC: 33.3 g/dL (ref 30.0–36.0)
MCV: 95.2 fL (ref 78.0–100.0)
Platelets: 227 10*3/uL (ref 150–400)
RBC: 4.77 MIL/uL (ref 3.87–5.11)
RDW: 14.4 % (ref 11.5–15.5)
WBC: 10.4 10*3/uL (ref 4.0–10.5)

## 2015-09-07 LAB — LACTIC ACID, PLASMA
LACTIC ACID, VENOUS: 1.6 mmol/L (ref 0.5–2.0)
Lactic Acid, Venous: 1 mmol/L (ref 0.5–2.0)

## 2015-09-07 LAB — BASIC METABOLIC PANEL
ANION GAP: 10 (ref 5–15)
BUN: 22 mg/dL — ABNORMAL HIGH (ref 6–20)
CO2: 27 mmol/L (ref 22–32)
Calcium: 9.5 mg/dL (ref 8.9–10.3)
Chloride: 105 mmol/L (ref 101–111)
Creatinine, Ser: 1.34 mg/dL — ABNORMAL HIGH (ref 0.44–1.00)
GFR calc non Af Amer: 39 mL/min — ABNORMAL LOW (ref >60)
GFR, EST AFRICAN AMERICAN: 45 mL/min — AB (ref >60)
Glucose, Bld: 133 mg/dL — ABNORMAL HIGH (ref 65–99)
POTASSIUM: 4.8 mmol/L (ref 3.5–5.1)
Sodium: 142 mmol/L (ref 135–145)

## 2015-09-07 LAB — URINE MICROSCOPIC-ADD ON

## 2015-09-07 LAB — I-STAT CG4 LACTIC ACID, ED: Lactic Acid, Venous: 2.46 mmol/L (ref 0.5–2.0)

## 2015-09-07 MED ORDER — MORPHINE SULFATE (PF) 2 MG/ML IV SOLN: 2.0000 mg | INTRAVENOUS | Status: DC | PRN

## 2015-09-07 MED ORDER — ACETAMINOPHEN 325 MG PO TABS
650.0000 mg | ORAL_TABLET | Freq: Once | ORAL | Status: AC
  Administered 2015-09-07: 650 mg via ORAL
  Filled 2015-09-07: qty 2

## 2015-09-07 MED ORDER — SODIUM CHLORIDE 0.9 % IV SOLN
INTRAVENOUS | Status: DC
  Administered 2015-09-07: 23:00:00 via INTRAVENOUS

## 2015-09-07 MED ORDER — VITAMIN B-12 1000 MCG PO TABS
500.0000 ug | ORAL_TABLET | Freq: Every day | ORAL | Status: DC
  Administered 2015-09-08 – 2015-09-09 (×2): 500 ug via ORAL
  Filled 2015-09-07 (×4): qty 1

## 2015-09-07 MED ORDER — CALCIUM CARBONATE-VITAMIN D 500-200 MG-UNIT PO TABS
1.0000 | ORAL_TABLET | Freq: Two times a day (BID) | ORAL | Status: DC
  Administered 2015-09-08 – 2015-09-09 (×3): 1 via ORAL
  Filled 2015-09-07 (×4): qty 1

## 2015-09-07 MED ORDER — ALENDRONATE SODIUM 70 MG PO TABS: 70.0000 mg | ORAL_TABLET | ORAL | Status: DC

## 2015-09-07 MED ORDER — ACETAMINOPHEN 325 MG PO TABS
650.0000 mg | ORAL_TABLET | Freq: Four times a day (QID) | ORAL | Status: DC | PRN
Start: 2015-09-07 — End: 2015-09-09

## 2015-09-07 MED ORDER — ONDANSETRON HCL 4 MG/2ML IJ SOLN: 4.0000 mg | Freq: Four times a day (QID) | INTRAMUSCULAR | Status: DC | PRN

## 2015-09-07 MED ORDER — HYDROCODONE-ACETAMINOPHEN 5-325 MG PO TABS: 1.0000 | ORAL_TABLET | ORAL | Status: DC | PRN

## 2015-09-07 MED ORDER — SODIUM CHLORIDE 0.9 % IV BOLUS (SEPSIS)
500.0000 mL | INTRAVENOUS | Status: AC
  Administered 2015-09-07: 500 mL via INTRAVENOUS

## 2015-09-07 MED ORDER — DEXTROSE 5 % IV SOLN: 1.0000 g | INTRAVENOUS | Status: DC

## 2015-09-07 MED ORDER — DEXTROSE 5 % IV SOLN
1.0000 g | Freq: Once | INTRAVENOUS | Status: AC
  Administered 2015-09-07: 1 g via INTRAVENOUS
  Filled 2015-09-07: qty 10

## 2015-09-07 MED ORDER — ACETAMINOPHEN 650 MG RE SUPP: 650.0000 mg | Freq: Four times a day (QID) | RECTAL | Status: DC | PRN

## 2015-09-07 MED ORDER — SODIUM CHLORIDE 0.9% FLUSH
3.0000 mL | Freq: Two times a day (BID) | INTRAVENOUS | Status: DC
  Administered 2015-09-08: 3 mL via INTRAVENOUS

## 2015-09-07 MED ORDER — LEVOTHYROXINE SODIUM 75 MCG PO TABS
75.0000 ug | ORAL_TABLET | Freq: Every day | ORAL | Status: DC
  Administered 2015-09-08 – 2015-09-09 (×2): 75 ug via ORAL
  Filled 2015-09-07 (×2): qty 1

## 2015-09-07 MED ORDER — ALPRAZOLAM 0.5 MG PO TABS: 0.5000 mg | ORAL_TABLET | Freq: Three times a day (TID) | ORAL | Status: DC | PRN

## 2015-09-07 MED ORDER — SODIUM CHLORIDE 0.9 % IV BOLUS (SEPSIS)
1000.0000 mL | Freq: Once | INTRAVENOUS | Status: AC
  Administered 2015-09-07: 1000 mL via INTRAVENOUS

## 2015-09-07 MED ORDER — ALUM & MAG HYDROXIDE-SIMETH 200-200-20 MG/5ML PO SUSP: 30.0000 mL | Freq: Four times a day (QID) | ORAL | Status: DC | PRN

## 2015-09-07 MED ORDER — HEPARIN SODIUM (PORCINE) 5000 UNIT/ML IJ SOLN
5000.0000 [IU] | Freq: Three times a day (TID) | INTRAMUSCULAR | Status: DC
Start: 2015-09-07 — End: 2015-09-08
  Administered 2015-09-08: 5000 [IU] via SUBCUTANEOUS
  Filled 2015-09-07: qty 1

## 2015-09-07 MED ORDER — SODIUM CHLORIDE 0.9 % IV BOLUS (SEPSIS)
1000.0000 mL | INTRAVENOUS | Status: AC
  Administered 2015-09-07: 1000 mL via INTRAVENOUS

## 2015-09-07 MED ORDER — ONDANSETRON HCL 4 MG PO TABS: 4.0000 mg | ORAL_TABLET | Freq: Four times a day (QID) | ORAL | Status: DC | PRN

## 2015-09-07 MED ORDER — SENNA 8.6 MG PO TABS
1.0000 | ORAL_TABLET | Freq: Two times a day (BID) | ORAL | Status: DC
Start: 2015-09-07 — End: 2015-09-09
  Administered 2015-09-08 (×2): 8.6 mg via ORAL
  Filled 2015-09-07 (×3): qty 1

## 2015-09-07 MED ORDER — ALBUTEROL SULFATE (2.5 MG/3ML) 0.083% IN NEBU: 2.5000 mg | INHALATION_SOLUTION | RESPIRATORY_TRACT | Status: DC | PRN

## 2015-09-07 NOTE — H&P (Signed)
Patient Demographics:    Amanda Armstrong, is a 70 y.o. female  MRN: [id number]   DOB - 03-26-1946  Admit Date - 09/07/2015  Outpatient Primary MD for the patient is Cassell Smiles., MD   With History of -  Past Medical History  Diagnosis Date  . Hypothyroidism   . Anxiety   . Arthritis     Bilateral Knee DJD  . Headache(784.0)     SINUS HEADACHE OCCASSIONALLY  . Vertigo   . Multifactorial gait disorder   . Clostridium difficile carrier     pt stated that she has intermittent sx  . Osteoporosis   . Chronic pain   . Hypertension   . Clostridium difficile infection       Past Surgical History  Procedure Laterality Date  . Joint replacement  11/08/3010    right total knee  . Tubal ligation  1982  . Total knee arthroplasty  11/07/2011    Procedure: TOTAL KNEE ARTHROPLASTY;  Surgeon: Nilda Simmer, MD;  Location: [facility] OR;  Service: Orthopedics;  Laterality: Left;  DR Anne Ng 90 MINUTES FOR THIS CASE      Chief Complaint  Patient presents with  . Tremors  . Fatigue      HPI:    Amanda Armstrong  is a 70 y.o. female, with past medical history relevant for hypothyroidism and osteopenia as well as tobacco use disorder who presents to the ED with concerns about fatigue, malaise, chills and dysuria of several days duration. No hematuria, no emesis, no flank pain. Patient reports fevers and chills last couple of days, in the ED today she is tachycardic. No chest pains, no leg pains and swelling in the preoperative symptoms. ED workup suggest sepsis from urinary source. No sick contacts at home    Review of systems:    In addition to the HPI above,   A full 12 point Review of Systems was done, except as stated above, all other Review of Systems were negative.    Social History:     Social  History  Substance Use Topics  . Smoking status: Current Every Day Smoker -- 1.00 packs/day for 48 years    Types: Cigarettes    Last Attempt to Quit: 06/15/2012  . Smokeless tobacco: Never Used     Comment: 12/29/14 1/2 PPD  . Alcohol Use: No    Lives -with family  Mobility - independent prior to admission   Family History :     Family History  Problem Relation Age of Onset  . Heart attack Mother   . COPD Father   . Arthritis Sister   . Heart disease Brother    Reviewed by me   Home Medications:   Prior to Admission medications   Medication Sig Start Date End Date Taking? Authorizing Provider  ALPRAZolam Prudy Feeler) 1 MG tablet  10/01/14  Yes Historical Provider, MD  calcium-vitamin D (OSCAL WITH D) 500-200  MG-UNIT tablet Take 1 tablet by mouth.   Yes Historical Provider, MD  levothyroxine (SYNTHROID, LEVOTHROID) 75 MCG tablet Take 75 mcg by mouth daily. 08/29/15  Yes Historical Provider, MD  naproxen sodium (ANAPROX) 220 MG tablet Take 440 mg by mouth 2 (two) times daily with a meal.   Yes Historical Provider, MD  vitamin B-12 (CYANOCOBALAMIN) 500 MCG tablet Take 500 mcg by mouth daily.   Yes Historical Provider, MD  alendronate (FOSAMAX) 70 MG tablet Take 70 mg by mouth every 7 (seven) days.  04/05/13   Historical Provider, MD     Allergies:     Allergies  Allergen Reactions  . Iron Other (See Comments)    "upset stomach"  . Oxycodone Other (See Comments)    "CX HER TO FELL LIKE SHE IS OUT OF HER BODY"     Physical Exam:   Vitals  Blood pressure 106/70, pulse 112, temperature 98.2 F (36.8 C), temperature source Oral, resp. rate 19, height 4\' 10"  (1.473 m), weight 69.4 kg (153 lb), SpO2 94 %.     Physical Examination: General appearance - alert,ill appearing, looks uncomfortable Mental status - alert, oriented to person, place, and time,  Eyes - sclera anicteric Neck - supple, no JVD elevation , Chest - clear  to auscultation bilaterally, symmetrical air  movement,  Heart - S1 and S2 normal, tachycardic Abdomen - soft, nontender, nondistended, no masses or organomegaly, no CVA area tenderness Neurological - screening mental status exam normal, neck supple without rigidity, cranial nerves II through XII intact, DTR's normal and symmetric Extremities - no pedal edema noted, intact peripheral pulses Skin - warm, dry   Data Review:    CBC  Recent Labs Lab 09/07/15 1144  WBC 10.4  HGB 15.1*  HCT 45.4  PLT 227  MCV 95.2  MCH 31.7  MCHC 33.3  RDW 14.4   ------------------------------------------------------------------------------------------------------------------  Chemistries   Recent Labs Lab 09/07/15 1144  NA 142  K 4.8  CL 105  CO2 27  GLUCOSE 133*  BUN 22*  CREATININE 1.34*  CALCIUM 9.5   ------------------------------------------------------------------------------------------------------------------ estimated creatinine clearance is 31.8 mL/min (by C-G formula based on Cr of 1.34). ------------------------------------------------------------------------------------------------------------------ No results for input(s): TSH, T4TOTAL, T3FREE, THYROIDAB in the last 72 hours.  Invalid input(s): FREET3   Coagulation profile No results for input(s): INR, PROTIME in the last 168 hours. ------------------------------------------------------------------------------------------------------------------- No results for input(s): DDIMER in the last 72 hours. -------------------------------------------------------------------------------------------------------------------  Cardiac Enzymes No results for input(s): CKMB, TROPONINI, MYOGLOBIN in the last 168 hours.  Invalid input(s): CK ------------------------------------------------------------------------------------------------------------------ No results found for:  BNP   ---------------------------------------------------------------------------------------------------------------  Urinalysis    Component Value Date/Time   COLORURINE YELLOW 09/07/2015 1430   APPEARANCEUR HAZY* 09/07/2015 1430   LABSPEC 1.020 09/07/2015 1430   PHURINE 5.5 09/07/2015 1430   GLUCOSEU NEGATIVE 09/07/2015 1430   HGBUR LARGE* 09/07/2015 1430   BILIRUBINUR NEGATIVE 09/07/2015 1430   KETONESUR NEGATIVE 09/07/2015 1430   PROTEINUR 100* 09/07/2015 1430   UROBILINOGEN 0.2 08/27/2013 1130   NITRITE POSITIVE* 09/07/2015 1430   LEUKOCYTESUR LARGE* 09/07/2015 1430    ----------------------------------------------------------------------------------------------------------------   Imaging Results:    Dg Chest Portable 1 View  09/07/2015  CLINICAL DATA:  Sob/smoker/fever/weakness EXAM: PORTABLE CHEST - 1 VIEW COMPARISON:  None available FINDINGS: Low lung volumes with some crowding of perihilar and bibasilar bronchovascular structures. No focal infiltrate. Heart size upper limits normal for technique. No effusion.  No pneumothorax. Visualized skeletal structures are unremarkable. IMPRESSION: No acute cardiopulmonary disease. Electronically Signed  By: Corlis Leak M.D.   On: 09/07/2015 14:26       Assessment & Plan:    Principal Problem:   Sepsis (HCC) Active Problems:   UTI (lower urinary tract infection)   Nicotine dependence    1)Sepsis secondary to urinary source-patient had fevers and chills at home, patient has tachycardia, lactic acid is elevated at above 2, will  empirically with IV Rocephin pending cultures, patient receive IV fluid bolus per sepsis protocol, systolic blood pressure above 161, repeat lactic acid pending  2)Nicotine dependence-smoking cessation strongly advised, given nicotine patch  3)Hypothyroidism/Osteopenia- resume home regimen  DVT Prophylaxis Heparin -    AM Labs Ordered, also please review Full Orders  Family Communication:  Admission, patients condition and plan of care including tests being ordered have been discussed with the patient and she indicates understanding and agree with the plan   Code Status Full  Likely DC to  Home   Condition   fair    Clessie Karras M.D on 09/07/2015 at 5:56 PM   Between 7am to 7pm - Pager - [phone]  After 7pm go to [email or web address] - password [email or web address]  [facility] - Office  [phone]  Dragon dictation system was used to create this note, attempts have been made to correct errors, however presence of uncorrected errors is not a reflection quality of care provided.

## 2015-09-07 NOTE — ED Provider Notes (Signed)
CSN: [id number]     Arrival date & time 09/07/15  1038 History   First MD Initiated Contact with Patient 09/07/15 1321     Chief Complaint  Patient presents with  . Tremors  . Fatigue   Patient is a 70 y.o. female presenting with weakness. The history is provided by the patient, the spouse and a relative.  Weakness This is a chronic problem. The current episode started more than 1 week ago. The problem occurs daily. The problem has been gradually worsening. Associated symptoms include shortness of breath. Pertinent negatives include no chest pain, no abdominal pain and no headaches. The symptoms are aggravated by walking. The symptoms are relieved by rest.  pt has had fatigue/tremors for "months" Today it seemed worse She felt lightheaded but no fall No syncope She reports shortness of breath No CP/cough No fever No HA No focal weakness She has difficulty walking at baseline for past several months Family thinks she has Parkinson but no recent evaluation No falls reported  Past Medical History  Diagnosis Date  . Hypothyroidism   . Anxiety   . Arthritis     Bilateral Knee DJD  . Headache(784.0)     SINUS HEADACHE OCCASSIONALLY  . Vertigo   . Multifactorial gait disorder   . Clostridium difficile carrier     pt stated that she has intermittent sx  . Osteoporosis   . Chronic pain   . Hypertension   . Clostridium difficile infection    Past Surgical History  Procedure Laterality Date  . Joint replacement  11/08/3010    right total knee  . Tubal ligation  1982  . Total knee arthroplasty  11/07/2011    Procedure: TOTAL KNEE ARTHROPLASTY;  Surgeon: Nilda Simmer, MD;  Location: [facility] OR;  Service: Orthopedics;  Laterality: Left;  DR Thurston Hole WANTS 90 MINUTES FOR THIS CASE   Family History  Problem Relation Age of Onset  . Heart attack Mother   . COPD Father   . Arthritis Sister   . Heart disease Brother    Social History  Substance Use Topics  . Smoking status: Current  Every Day Smoker -- 1.00 packs/day for 48 years    Types: Cigarettes    Last Attempt to Quit: 06/15/2012  . Smokeless tobacco: Never Used     Comment: 12/29/14 1/2 PPD  . Alcohol Use: No   OB History    No data available     Review of Systems  Constitutional: Positive for fatigue. Negative for fever.  Respiratory: Positive for shortness of breath.   Cardiovascular: Negative for chest pain.  Gastrointestinal: Negative for abdominal pain.  Neurological: Positive for weakness and light-headedness. Negative for syncope and headaches.  All other systems reviewed and are negative.     Allergies  Iron and Oxycodone  Home Medications   Prior to Admission medications   Medication Sig Start Date End Date Taking? Authorizing Provider  ALPRAZolam Prudy Feeler) 1 MG tablet  10/01/14  Yes Historical Provider, MD  calcium-vitamin D (OSCAL WITH D) 500-200 MG-UNIT tablet Take 1 tablet by mouth.   Yes Historical Provider, MD  levothyroxine (SYNTHROID, LEVOTHROID) 75 MCG tablet Take 75 mcg by mouth daily. 08/29/15  Yes Historical Provider, MD  naproxen sodium (ANAPROX) 220 MG tablet Take 440 mg by mouth 2 (two) times daily with a meal.   Yes Historical Provider, MD  vitamin B-12 (CYANOCOBALAMIN) 500 MCG tablet Take 500 mcg by mouth daily.   Yes Historical Provider, MD  alendronate (FOSAMAX)  70 MG tablet Take 70 mg by mouth every 7 (seven) days.  04/05/13   Historical Provider, MD   BP 129/70 mmHg  Pulse 117  Temp(Src) 98.9 F (37.2 C) (Oral)  Resp 41  Ht  (1.473 m)  Wt 69.4 kg  BMI 31.99 kg/m2  SpO2 96% Physical Exam CONSTITUTIONAL: elderly, frail HEAD: Normocephalic/atraumatic EYES: EOMI/PERRL ENMT: Mucous membranes moist NECK: supple no meningeal signs SPINE/BACK:entire spine nontender CV: S1/S2 noted, no murmurs/rubs/gallops noted LUNGS: Lungs are clear to auscultation bilaterally, mild tachypnea noted ABDOMEN: soft, nontender, no rebound or guarding, bowel sounds noted throughout  abdomen GU:no cva tenderness NEURO: Pt is awake/alert/appropriate, moves all extremitiesx4.  No facial droop.  No arm or leg drift. Mild upper extremity tremor noted EXTREMITIES: pulses normal/equal, full ROM SKIN: warm, color normal PSYCH: no abnormalities of mood noted, alert and oriented to situation  ED Course  Procedures    Medications  sodium chloride 0.9 % bolus 1,000 mL (1,000 mLs Intravenous Not Given 09/07/15 1717)    Followed by  sodium chloride 0.9 % bolus 500 mL (500 mLs Intravenous New Bag/Given 09/07/15 1741)  sodium chloride 0.9 % bolus 1,000 mL (0 mLs Intravenous Stopped 09/07/15 1522)  cefTRIAXone (ROCEPHIN) 1 g in dextrose 5 % 50 mL IVPB (0 g Intravenous Stopped 09/07/15 1608)  sodium chloride 0.9 % bolus 1,000 mL (1,000 mLs Intravenous New Bag/Given 09/07/15 1644)  acetaminophen (TYLENOL) tablet 650 mg (650 mg Oral Given 09/07/15 1644)    2:49 PM Pt with tremor/fatigue for months but it was felt that she worsened today She is tachypneic/tachycardic bu no CP reported Labs/imaging ordered at this time 3:39 PM UTI noted Pt would like to go home IV rocephin ordered 5:13 PM While in Ed, she became more tachycardic and now temperature is elevating Lactate >2 Code sepsis called Will give IV fluids Will admit 5:45 PM D/w dr courage, will admit  Labs Review Labs Reviewed  BASIC METABOLIC PANEL - Abnormal; Notable for the following:    Glucose, Bld 133 (*)    BUN 22 (*)    Creatinine, Ser 1.34 (*)    GFR calc non Af Amer 39 (*)    GFR calc Af Amer 45 (*)    All other components within normal limits  CBC - Abnormal; Notable for the following:    Hemoglobin 15.1 (*)    All other components within normal limits  URINALYSIS, ROUTINE W REFLEX MICROSCOPIC (NOT AT [facility]) - Abnormal; Notable for the following:    APPearance HAZY (*)    Hgb urine dipstick LARGE (*)    Protein, ur 100 (*)    Nitrite POSITIVE (*)    Leukocytes, UA LARGE (*)    All other components  within normal limits  URINE MICROSCOPIC-ADD ON - Abnormal; Notable for the following:    Squamous Epithelial / LPF 0-5 (*)    Bacteria, UA MANY (*)    All other components within normal limits  I-STAT CG4 LACTIC ACID, ED - Abnormal; Notable for the following:    Lactic Acid, Venous 2.46 (*)    All other components within normal limits  URINE CULTURE  CULTURE, BLOOD (ROUTINE X 2)  CULTURE, BLOOD (ROUTINE X 2)    Imaging Review Dg Chest Portable 1 View  09/07/2015  CLINICAL DATA:  Sob/smoker/fever/weakness EXAM: PORTABLE CHEST - 1 VIEW COMPARISON:  None available FINDINGS: Low lung volumes with some crowding of perihilar and bibasilar bronchovascular structures. No focal infiltrate. Heart size upper limits normal for  technique. No effusion.  No pneumothorax. Visualized skeletal structures are unremarkable. IMPRESSION: No acute cardiopulmonary disease. Electronically Signed   By: Corlis Leak M.D.   On: 09/07/2015 14:26   I have personally reviewed and evaluated these images and lab results as part of my medical decision-making.   EKG Interpretation   Date/Time:  Monday September 07 2015 14:00:07 EST Ventricular Rate:  117 PR Interval:  161 QRS Duration: 86 QT Interval:  332 QTC Calculation: 463 R Axis:   32 Text Interpretation:  Sinus tachycardia Low voltage, precordial leads No  significant change since last tracing Confirmed by Bebe Shaggy  MD, Dorinda Hill  [phone]) on 09/07/2015 2:07:10 PM     Medications  cefTRIAXone (ROCEPHIN) 1 g in dextrose 5 % 50 mL IVPB (1 g Intravenous New Bag/Given 09/07/15 1529)  sodium chloride 0.9 % bolus 1,000 mL (0 mLs Intravenous Stopped 09/07/15 1522)     MDM   Final diagnoses:  UTI (lower urinary tract infection)  Sepsis, due to unspecified organism [facility])    Nursing notes including past medical history and social history reviewed and considered in documentation xrays/imaging reviewed by myself and considered during evaluation Labs/vital reviewed  myself and considered during evaluation     Zadie Rhine, MD 09/07/15 1746

## 2015-09-07 NOTE — ED Notes (Signed)
Pt states "I have been shaking all over". Denies chills. Temp of 99.9. States generalzied weakness since waking this morning. Husband states pt intermittently loses her balance and is unable to use walker at times  x 3-4 months.

## 2015-09-07 NOTE — ED Notes (Signed)
Pt states that she feels much better at this time.   

## 2015-09-07 NOTE — ED Notes (Signed)
Pt was unable to ambulate with walker.  Had to have maximum 2 person assist to get out of bed and was only able to take a few steps with 2 people and walker.  Daughter states this is far from her normal ability to ambulate.  Pt was also incontinent of urine and stool.  Cleaned of both.

## 2015-09-07 NOTE — ED Notes (Addendum)
Pt's husband, Fayrene Fearing, went home.  Phone number [phone].  Daughter [name] [phone]

## 2015-09-08 LAB — BASIC METABOLIC PANEL
Anion gap: 7 (ref 5–15)
BUN: 14 mg/dL (ref 6–20)
CALCIUM: 7.6 mg/dL — AB (ref 8.9–10.3)
CO2: 22 mmol/L (ref 22–32)
CREATININE: 1.02 mg/dL — AB (ref 0.44–1.00)
Chloride: 113 mmol/L — ABNORMAL HIGH (ref 101–111)
GFR, EST NON AFRICAN AMERICAN: 54 mL/min — AB (ref >60)
Glucose, Bld: 100 mg/dL — ABNORMAL HIGH (ref 65–99)
Potassium: 3.6 mmol/L (ref 3.5–5.1)
SODIUM: 142 mmol/L (ref 135–145)

## 2015-09-08 LAB — CBC
HCT: 35.6 % — ABNORMAL LOW (ref 36.0–46.0)
Hemoglobin: 11.6 g/dL — ABNORMAL LOW (ref 12.0–15.0)
MCH: 31 pg (ref 26.0–34.0)
MCHC: 32.6 g/dL (ref 30.0–36.0)
MCV: 95.2 fL (ref 78.0–100.0)
PLATELETS: 175 10*3/uL (ref 150–400)
RBC: 3.74 MIL/uL — AB (ref 3.87–5.11)
RDW: 14.7 % (ref 11.5–15.5)
WBC: 10.7 10*3/uL — AB (ref 4.0–10.5)

## 2015-09-08 LAB — MRSA PCR SCREENING: MRSA BY PCR: NEGATIVE

## 2015-09-08 LAB — TSH: TSH: 0.266 u[IU]/mL — ABNORMAL LOW (ref 0.350–4.500)

## 2015-09-08 MED ORDER — RISAQUAD PO CAPS
2.0000 | ORAL_CAPSULE | Freq: Three times a day (TID) | ORAL | Status: DC
  Administered 2015-09-08 – 2015-09-09 (×4): 2 via ORAL
  Filled 2015-09-08 (×5): qty 2

## 2015-09-08 MED ORDER — HEPARIN SODIUM (PORCINE) 5000 UNIT/ML IJ SOLN
5000.0000 [IU] | Freq: Three times a day (TID) | INTRAMUSCULAR | Status: DC
  Administered 2015-09-08 – 2015-09-09 (×5): 5000 [IU] via SUBCUTANEOUS
  Filled 2015-09-08 (×5): qty 1

## 2015-09-08 MED ORDER — DEXTROSE 5 % IV SOLN
1.0000 g | INTRAVENOUS | Status: DC
  Administered 2015-09-08: 1 g via INTRAVENOUS
  Filled 2015-09-08 (×2): qty 10

## 2015-09-08 NOTE — Progress Notes (Signed)
[facility] PROGRESS NOTE  Amanda Armstrong ZOX:[id number] DOB: 1946-02-04    PCP:   Cassell Smiles., MD   HPI: Amanda Armstrong is an 69 y.o. female with hx of hypothyroidism, anxiety, C diff, admitted for sepsis due to UTI.  She was given IVF and Rocephin.  Her urine culture is pending, and she is feeling better.  BP is up, no pressor required. She has no diarrhea.   Rewiew of Systems:  Constitutional: Negative for malaise, fever and chills. No significant weight loss or weight gain Eyes: Negative for eye pain, redness and discharge, diplopia, visual changes, or flashes of light. ENMT: Negative for ear pain, hoarseness, nasal congestion, sinus pressure and sore throat. No headaches; tinnitus, drooling, or problem swallowing. Cardiovascular: Negative for chest pain, palpitations, diaphoresis, dyspnea and peripheral edema. ; No orthopnea, PND Respiratory: Negative for cough, hemoptysis, wheezing and stridor. No pleuritic chestpain. Gastrointestinal: Negative for nausea, vomiting, diarrhea, constipation, abdominal pain, melena, blood in stool, hematemesis, jaundice and rectal bleeding.    Genitourinary: Negative for frequency, dysuria, incontinence,flank pain and hematuria; Musculoskeletal: Negative for back pain and neck pain. Negative for swelling and trauma.;  Skin: . Negative for pruritus, rash, abrasions, bruising and skin lesion.; ulcerations Neuro: Negative for headache, lightheadedness and neck stiffness. Negative for weakness, altered level of consciousness , altered mental status, extremity weakness, burning feet, involuntary movement, seizure and syncope.  Psych: negative for anxiety, depression, insomnia, tearfulness, panic attacks, hallucinations, paranoia, suicidal or homicidal ideation    Past Medical History  Diagnosis Date  . Hypothyroidism   . Anxiety   . Arthritis     Bilateral Knee DJD  . Headache(784.0)     SINUS HEADACHE OCCASSIONALLY  . Vertigo   .  Multifactorial gait disorder   . Clostridium difficile carrier     pt stated that she has intermittent sx  . Osteoporosis   . Chronic pain   . Hypertension   . Clostridium difficile infection     Past Surgical History  Procedure Laterality Date  . Joint replacement  11/08/3010    right total knee  . Tubal ligation  1982  . Total knee arthroplasty  11/07/2011    Procedure: TOTAL KNEE ARTHROPLASTY;  Surgeon: Nilda Simmer, MD;  Location: [facility] OR;  Service: Orthopedics;  Laterality: Left;  DR Anne Ng 90 MINUTES FOR THIS CASE    Medications:  HOME MEDS: Prior to Admission medications   Medication Sig Start Date End Date Taking? Authorizing Provider  ALPRAZolam Prudy Feeler) 1 MG tablet  10/01/14  Yes Historical Provider, MD  calcium-vitamin D (OSCAL WITH D) 500-200 MG-UNIT tablet Take 1 tablet by mouth.   Yes Historical Provider, MD  levothyroxine (SYNTHROID, LEVOTHROID) 75 MCG tablet Take 75 mcg by mouth daily. 08/29/15  Yes Historical Provider, MD  naproxen sodium (ANAPROX) 220 MG tablet Take 440 mg by mouth 2 (two) times daily with a meal.   Yes Historical Provider, MD  vitamin B-12 (CYANOCOBALAMIN) 500 MCG tablet Take 500 mcg by mouth daily.   Yes Historical Provider, MD  alendronate (FOSAMAX) 70 MG tablet Take 70 mg by mouth every 7 (seven) days.  04/05/13   Historical Provider, MD     Allergies:  Allergies  Allergen Reactions  . Iron Other (See Comments)    "upset stomach"  . Oxycodone Other (See Comments)    "CX HER TO FELL LIKE SHE IS OUT OF HER BODY"    Social History:   reports that she has been smoking Cigarettes.  She has a 48 pack-year smoking history. She has never used smokeless tobacco. She reports that she does not drink alcohol or use illicit drugs.  Family History: Family History  Problem Relation Age of Onset  . Heart attack Mother   . COPD Father   . Arthritis Sister   . Heart disease Brother      Physical Exam: Filed Vitals:   09/08/15 0600 09/08/15  0700 09/08/15 0757 09/08/15 0800  BP: 115/55 109/54  108/54  Pulse: 95 86  79  Temp:   97.9 F (36.6 C)   TempSrc:   Oral   Resp: 21 25  27   Height:      Weight:      SpO2: 97% 96%  97%   Blood pressure 108/54, pulse 79, temperature 97.9 F (36.6 C), temperature source Oral, resp. rate 27, height 4\' 10"  (1.473 m), weight 70 kg (154 lb 5.2 oz), SpO2 97 %.  GEN:  Pleasant  patient lying in the stretcher in no acute distress; cooperative with exam. PSYCH:  alert and oriented x4; does not appear anxious or depressed; affect is appropriate. HEENT: Mucous membranes pink and anicteric; PERRLA; EOM intact; no cervical lymphadenopathy nor thyromegaly or carotid bruit; no JVD; There were no stridor. Neck is very supple. Breasts:: Not examined CHEST WALL: No tenderness CHEST: Normal respiration, clear to auscultation bilaterally.  HEART: Regular rate and rhythm.  There are no murmur, rub, or gallops.   BACK: No kyphosis or scoliosis; no CVA tenderness ABDOMEN: soft and non-tender; no masses, no organomegaly, normal abdominal bowel sounds; no pannus; no intertriginous candida. There is no rebound and no distention. Rectal Exam: Not done EXTREMITIES: No bone or joint deformity; age-appropriate arthropathy of the hands and knees; no edema; no ulcerations.  There is no calf tenderness. Genitalia: not examined PULSES: 2+ and symmetric SKIN: Normal hydration no rash or ulceration CNS: Cranial nerves 2-12 grossly intact no focal lateralizing neurologic deficit.  Speech is fluent; uvula elevated with phonation, facial symmetry and tongue midline. DTR are normal bilaterally, cerebella exam is intact, barbinski is negative and strengths are equaled bilaterally.  No sensory loss.   Labs on Admission:  Basic Metabolic Panel:  Recent Labs Lab 09/07/15 1144 09/08/15 0457  NA 142 142  K 4.8 3.6  CL 105 113*  CO2 27 22  GLUCOSE 133* 100*  BUN 22* 14  CREATININE 1.34* 1.02*  CALCIUM 9.5 7.6*    Liver Function Tests: CBC:  Recent Labs Lab 09/07/15 1144 09/08/15 0457  WBC 10.4 10.7*  HGB 15.1* 11.6*  HCT 45.4 35.6*  MCV 95.2 95.2  PLT 227 175   Radiological Exams on Admission: Dg Chest Portable 1 View  09/07/2015  CLINICAL DATA:  Sob/smoker/fever/weakness EXAM: PORTABLE CHEST - 1 VIEW COMPARISON:  None available FINDINGS: Low lung volumes with some crowding of perihilar and bibasilar bronchovascular structures. No focal infiltrate. Heart size upper limits normal for technique. No effusion.  No pneumothorax. Visualized skeletal structures are unremarkable. IMPRESSION: No acute cardiopulmonary disease. Electronically Signed   By: Corlis Leak M.D.   On: 09/07/2015 14:26   Assessment/Plan Present on Admission:  . Sepsis (HCC) . UTI (lower urinary tract infection) . Nicotine dependence   PLAN:  Will continue with IV Rocephin.  Increase activities.  Can D/C IVF and transfer to floor today.  Continue with her meds.  Encouraged tobacco cessation, though she is not ready to quit.  For her hypothyroidism, will check TSH, and continue supplement.  She is  stable.   Other plans as per orders. Code Status:FULL CODE.    Houston Siren, MD.  FACP [facility] Pager [phone] 7pm to 7am.  09/08/2015, 9:44 AM

## 2015-09-08 NOTE — Care Management Note (Signed)
Case Management Note  Patient Details  Name: Amanda Armstrong MRN: [id number] Date of Birth: 1945/12/03  Subjective/Objective:                  Pt admitted with sepsis. Pt is from home, lives with husband and is ind with ADL's. Pt has cane and walker to use if needed. Pt has home O2 concentrator but does not have have port tanks. Pt does not remember who provides her home O2. Pt has no HH services prior to admission. Pt is not homebound and [facility] services are not anticipated to be needed.   Action/Plan: Pt will need home O2 assessment prior to discharge. Pt may need cont O2 set up prior to DC. Pt will need to ambulate with nursing staff, PT eval if necessary. Will cont to follow.   Expected Discharge Date:     09/11/2015             Expected Discharge Plan:  Home/Self Care  In-House Referral:  NA  Discharge planning Services  CM Consult  Post Acute Care Choice:  NA Choice offered to:  NA  DME Arranged:    DME Agency:     HH Arranged:    HH Agency:     Status of Service:  In process, will continue to follow  Medicare Important Message Given:    Date Medicare IM Given:    Medicare IM give by:    Date Additional Medicare IM Given:    Additional Medicare Important Message give by:     If discussed at Long Length of Stay Meetings, dates discussed:    Additional Comments:  Malcolm Metro, RN 09/08/2015, 3:36 PM

## 2015-09-08 NOTE — Progress Notes (Signed)
Gave visual report to RN who will be taking patient in room 211. Will tranport by wheel chair.

## 2015-09-09 LAB — URINE CULTURE

## 2015-09-09 MED ORDER — CIPROFLOXACIN HCL 250 MG PO TABS
500.0000 mg | ORAL_TABLET | Freq: Two times a day (BID) | ORAL | Status: DC
  Administered 2015-09-09: 500 mg via ORAL
  Filled 2015-09-09: qty 2

## 2015-09-09 MED ORDER — CIPROFLOXACIN HCL 500 MG PO TABS: 500.0000 mg | ORAL_TABLET | Freq: Two times a day (BID) | ORAL | Status: DC

## 2015-09-09 MED ORDER — RISAQUAD PO CAPS: 2.0000 | ORAL_CAPSULE | Freq: Three times a day (TID) | ORAL | Status: DC

## 2015-09-09 NOTE — Care Management Note (Signed)
Case Management Note  Patient Details  Name: SOLENNE MANWARREN MRN: [id number] Date of Birth: 25-May-1946  Expected Discharge Date:     09/09/2015             Expected Discharge Plan:  Home/Self Care  In-House Referral:  NA  Discharge planning Services  CM Consult  Post Acute Care Choice:  NA Choice offered to:  NA  DME Arranged:    DME Agency:     HH Arranged:    HH Agency:     Status of Service:  Completed, signed off  Medicare Important Message Given:    Date Medicare IM Given:    Medicare IM give by:    Date Additional Medicare IM Given:    Additional Medicare Important Message give by:     If discussed at Long Length of Stay Meetings, dates discussed:    Additional Comments: Pt plans to return home with self care today. No CM needs.  Malcolm Metro, RN 09/09/2015, 1:52 PM

## 2015-09-09 NOTE — Progress Notes (Signed)
O2 removed  Pt tolerated well O2 96% on room air

## 2015-09-09 NOTE — Discharge Summary (Signed)
Physician Discharge Summary  CERENITI CURB YNW:[id number] DOB: 12/21/1945 DOA: 09/07/2015  PCP: Cassell Smiles., MD  Admit date: 09/07/2015 Discharge date: 09/09/2015  Time spent: 35 minutes  Recommendations for Outpatient Follow-up:  1. Follow up with PCP in one week.   Discharge Diagnoses:  Principal Problem:   Sepsis (HCC) Active Problems:   UTI (lower urinary tract infection)   Nicotine dependence   Discharge Condition: Improved.  No lightheadedness, pain, fever, or chills.  Diet recommendation: cardiac diet.   Filed Weights   09/07/15 1053 09/08/15 0256  Weight: 69.4 kg (153 lb) 70 kg (154 lb 5.2 oz)    History of present illness: Patient was admitted by Dr Hampton Abbot on Sep 07, 2015 for sepsis due to UTI.  As per his H and P: " Sritha Mapp is a 70 y.o. female, with past medical history relevant for hypothyroidism and osteopenia as well as tobacco use disorder who presents to the ED with concerns about fatigue, malaise, chills and dysuria of several days duration. No hematuria, no emesis, no flank pain. Patient reports fevers and chills last couple of days, in the ED today she is tachycardic. No chest pains, no leg pains and swelling in the preoperative symptoms. ED workup suggest sepsis from urinary source. No sick contacts at home   Hospital Course: ELLESSE ANTENUCCI is an 70 y.o. female with hx of hypothyroidism, anxiety, C diff, admitted for sepsis due to UTI. She was given IVF and Rocephin. Her urine culture is pending, and she is feeling better. BP is up, no pressor required. She has no diarrhea. She was started on Lactinex, and her urine culture grew E coli, sensitive to Rocephin and Cipro.  She felt well, anxious to go home, and will be discharged to home today on 5 more days of Cipro at  [facility].  She will see her PCP next week for follow up.  Thank you for allowing me to partake in the care of this nice patient.  Good Day.    Discharge Exam: Filed Vitals:    09/08/15 1942 09/09/15 0540  BP: 109/95 102/60  Pulse: 93 71  Temp: 98.7 F (37.1 C) 97.7 F (36.5 C)  Resp: 26 24     Discharge Instructions    Current Discharge Medication List    START taking these medications   Details  acidophilus (RISAQUAD) CAPS capsule Take 2 capsules by mouth 3 (three) times daily. Qty: 30 capsule, Refills: 0      CONTINUE these medications which have NOT CHANGED   Details  ALPRAZolam (XANAX) 1 MG tablet     calcium-vitamin D (OSCAL WITH D) 500-200 MG-UNIT tablet Take 1 tablet by mouth.    levothyroxine (SYNTHROID, LEVOTHROID) 75 MCG tablet Take 75 mcg by mouth daily.    vitamin B-12 (CYANOCOBALAMIN) 500 MCG tablet Take 500 mcg by mouth daily.    alendronate (FOSAMAX) 70 MG tablet Take 70 mg by mouth every 7 (seven) days.       STOP taking these medications     naproxen sodium (ANAPROX) 220 MG tablet        Allergies  Allergen Reactions  . Iron Other (See Comments)    "upset stomach"  . Oxycodone Other (See Comments)    "CX HER TO FELL LIKE SHE IS OUT OF HER BODY"      The results of significant diagnostics from this hospitalization (including imaging, microbiology, ancillary and laboratory) are listed below for reference.    Significant Diagnostic Studies: Dg  Chest Portable 1 View  09/07/2015  CLINICAL DATA:  Sob/smoker/fever/weakness EXAM: PORTABLE CHEST - 1 VIEW COMPARISON:  None available FINDINGS: Low lung volumes with some crowding of perihilar and bibasilar bronchovascular structures. No focal infiltrate. Heart size upper limits normal for technique. No effusion.  No pneumothorax. Visualized skeletal structures are unremarkable. IMPRESSION: No acute cardiopulmonary disease. Electronically Signed   By: Corlis Leak M.D.   On: 09/07/2015 14:26    Microbiology: Recent Results (from the past 240 hour(s))  Urine culture     Status: None   Collection Time: 09/07/15  2:30 PM  Result Value Ref Range Status   Specimen Description  URINE, CATHETERIZED  Final   Special Requests NONE  Final   Culture   Final    >=100,000 COLONIES/mL ESCHERICHIA COLI Performed at [facility]    Report Status 09/09/2015 FINAL  Final   Organism ID, Bacteria ESCHERICHIA COLI  Final      Susceptibility   Escherichia coli - MIC*    AMPICILLIN <=2 SENSITIVE Sensitive     CEFAZOLIN <=4 SENSITIVE Sensitive     CEFTRIAXONE <=1 SENSITIVE Sensitive     CIPROFLOXACIN <=0.25 SENSITIVE Sensitive     GENTAMICIN <=1 SENSITIVE Sensitive     IMIPENEM <=0.25 SENSITIVE Sensitive     NITROFURANTOIN <=16 SENSITIVE Sensitive     TRIMETH/SULFA <=20 SENSITIVE Sensitive     AMPICILLIN/SULBACTAM <=2 SENSITIVE Sensitive     PIP/TAZO <=4 SENSITIVE Sensitive     * >=100,000 COLONIES/mL ESCHERICHIA COLI  Blood Culture (routine x 2)     Status: None (Preliminary result)   Collection Time: 09/07/15  5:27 PM  Result Value Ref Range Status   Specimen Description BLOOD RIGHT HAND  Final   Special Requests BOTTLES DRAWN AEROBIC AND ANAEROBIC 4CC EACH  Final   Culture NO GROWTH 2 DAYS  Final   Report Status PENDING  Incomplete  Blood Culture (routine x 2)     Status: None (Preliminary result)   Collection Time: 09/07/15  5:35 PM  Result Value Ref Range Status   Specimen Description BLOOD RIGHT ARM  Final   Special Requests   Final    BOTTLES DRAWN AEROBIC AND ANAEROBIC AEB=7CC ANA=6CC   Culture NO GROWTH 2 DAYS  Final   Report Status PENDING  Incomplete  MRSA PCR Screening     Status: None   Collection Time: 09/08/15  3:14 AM  Result Value Ref Range Status   MRSA by PCR NEGATIVE NEGATIVE Final    Comment:        The GeneXpert MRSA Assay (FDA approved for NASAL specimens only), is one component of a comprehensive MRSA colonization surveillance program. It is not intended to diagnose MRSA infection nor to guide or monitor treatment for MRSA infections.      Labs: Basic Metabolic Panel:  Recent Labs Lab 09/07/15 1144 09/08/15 0457  NA  142 142  K 4.8 3.6  CL 105 113*  CO2 27 22  GLUCOSE 133* 100*  BUN 22* 14  CREATININE 1.34* 1.02*  CALCIUM 9.5 7.6*   CBC:  Recent Labs Lab 09/07/15 1144 09/08/15 0457  WBC 10.4 10.7*  HGB 15.1* 11.6*  HCT 45.4 35.6*  MCV 95.2 95.2  PLT 227 175   Signed:  Zayden Hahne MD.  [facility] 09/09/2015, 11:17 AM

## 2015-09-13 LAB — CULTURE, BLOOD (ROUTINE X 2)
CULTURE: NO GROWTH
Culture: NO GROWTH

## 2015-09-14 DIAGNOSIS — M1991 Primary osteoarthritis, unspecified site: Secondary | ICD-10-CM | POA: Diagnosis not present

## 2015-09-14 DIAGNOSIS — M810 Age-related osteoporosis without current pathological fracture: Secondary | ICD-10-CM | POA: Diagnosis not present

## 2015-09-14 DIAGNOSIS — E6609 Other obesity due to excess calories: Secondary | ICD-10-CM | POA: Diagnosis not present

## 2015-09-14 DIAGNOSIS — Z6830 Body mass index (BMI) 30.0-30.9, adult: Secondary | ICD-10-CM | POA: Diagnosis not present

## 2015-09-14 DIAGNOSIS — J449 Chronic obstructive pulmonary disease, unspecified: Secondary | ICD-10-CM | POA: Diagnosis not present

## 2015-09-14 DIAGNOSIS — Z1389 Encounter for screening for other disorder: Secondary | ICD-10-CM | POA: Diagnosis not present

## 2015-09-14 DIAGNOSIS — N390 Urinary tract infection, site not specified: Secondary | ICD-10-CM | POA: Diagnosis not present

## 2015-09-28 DIAGNOSIS — E063 Autoimmune thyroiditis: Secondary | ICD-10-CM | POA: Diagnosis not present

## 2015-09-28 DIAGNOSIS — E669 Obesity, unspecified: Secondary | ICD-10-CM | POA: Diagnosis not present

## 2015-09-28 DIAGNOSIS — Z6830 Body mass index (BMI) 30.0-30.9, adult: Secondary | ICD-10-CM | POA: Diagnosis not present

## 2015-09-28 DIAGNOSIS — J449 Chronic obstructive pulmonary disease, unspecified: Secondary | ICD-10-CM | POA: Diagnosis not present

## 2015-09-28 DIAGNOSIS — E6609 Other obesity due to excess calories: Secondary | ICD-10-CM | POA: Diagnosis not present

## 2015-09-28 DIAGNOSIS — L03116 Cellulitis of left lower limb: Secondary | ICD-10-CM | POA: Diagnosis not present

## 2015-09-28 DIAGNOSIS — Z1389 Encounter for screening for other disorder: Secondary | ICD-10-CM | POA: Diagnosis not present

## 2015-10-25 ENCOUNTER — Emergency Department (HOSPITAL_COMMUNITY)
Admission: EM | Admit: 2015-10-25 | Discharge: 2015-10-25 | Disposition: A | Discharge status: Home / Self Care | Payer: Medicare HMO | Attending: Emergency Medicine | Admitting: Emergency Medicine

## 2015-10-25 ENCOUNTER — Emergency Department (HOSPITAL_COMMUNITY): Payer: Medicare HMO

## 2015-10-25 ENCOUNTER — Encounter (HOSPITAL_COMMUNITY): Payer: Self-pay

## 2015-10-25 DIAGNOSIS — Z79899 Other long term (current) drug therapy: Secondary | ICD-10-CM | POA: Diagnosis not present

## 2015-10-25 DIAGNOSIS — M1990 Unspecified osteoarthritis, unspecified site: Secondary | ICD-10-CM | POA: Diagnosis not present

## 2015-10-25 DIAGNOSIS — R69 Illness, unspecified: Secondary | ICD-10-CM | POA: Diagnosis not present

## 2015-10-25 DIAGNOSIS — N390 Urinary tract infection, site not specified: Secondary | ICD-10-CM | POA: Diagnosis not present

## 2015-10-25 DIAGNOSIS — F17210 Nicotine dependence, cigarettes, uncomplicated: Secondary | ICD-10-CM | POA: Insufficient documentation

## 2015-10-25 DIAGNOSIS — R531 Weakness: Secondary | ICD-10-CM | POA: Diagnosis not present

## 2015-10-25 DIAGNOSIS — M549 Dorsalgia, unspecified: Secondary | ICD-10-CM | POA: Diagnosis present

## 2015-10-25 DIAGNOSIS — R52 Pain, unspecified: Secondary | ICD-10-CM | POA: Diagnosis not present

## 2015-10-25 DIAGNOSIS — I10 Essential (primary) hypertension: Secondary | ICD-10-CM | POA: Insufficient documentation

## 2015-10-25 DIAGNOSIS — E039 Hypothyroidism, unspecified: Secondary | ICD-10-CM | POA: Diagnosis not present

## 2015-10-25 DIAGNOSIS — R404 Transient alteration of awareness: Secondary | ICD-10-CM | POA: Diagnosis not present

## 2015-10-25 DIAGNOSIS — R05 Cough: Secondary | ICD-10-CM | POA: Diagnosis not present

## 2015-10-25 LAB — CBC WITH DIFFERENTIAL/PLATELET
Basophils Absolute: 0 10*3/uL (ref 0.0–0.1)
Basophils Relative: 0 %
Eosinophils Absolute: 0.2 10*3/uL (ref 0.0–0.7)
Eosinophils Relative: 2 %
HCT: 38.4 % (ref 36.0–46.0)
Hemoglobin: 12.5 g/dL (ref 12.0–15.0)
Lymphocytes Relative: 25 %
Lymphs Abs: 2.1 10*3/uL (ref 0.7–4.0)
MCH: 30.9 pg (ref 26.0–34.0)
MCHC: 32.6 g/dL (ref 30.0–36.0)
MCV: 94.8 fL (ref 78.0–100.0)
Monocytes Absolute: 0.7 10*3/uL (ref 0.1–1.0)
Monocytes Relative: 8 %
Neutro Abs: 5.5 10*3/uL (ref 1.7–7.7)
Neutrophils Relative %: 65 %
Platelets: 235 10*3/uL (ref 150–400)
RBC: 4.05 MIL/uL (ref 3.87–5.11)
RDW: 14.8 % (ref 11.5–15.5)
WBC: 8.5 10*3/uL (ref 4.0–10.5)

## 2015-10-25 LAB — URINALYSIS, ROUTINE W REFLEX MICROSCOPIC
Bilirubin Urine: NEGATIVE
Glucose, UA: NEGATIVE mg/dL
Ketones, ur: NEGATIVE mg/dL
Nitrite: NEGATIVE
Protein, ur: 30 mg/dL — AB
Specific Gravity, Urine: 1.015 (ref 1.005–1.030)
pH: 6 (ref 5.0–8.0)

## 2015-10-25 LAB — BASIC METABOLIC PANEL
Anion gap: 8 (ref 5–15)
BUN: 16 mg/dL (ref 6–20)
CO2: 25 mmol/L (ref 22–32)
Calcium: 8.4 mg/dL — ABNORMAL LOW (ref 8.9–10.3)
Chloride: 109 mmol/L (ref 101–111)
Creatinine, Ser: 1.2 mg/dL — ABNORMAL HIGH (ref 0.44–1.00)
GFR calc Af Amer: 51 mL/min — ABNORMAL LOW (ref >60)
GFR calc non Af Amer: 44 mL/min — ABNORMAL LOW (ref >60)
Glucose, Bld: 84 mg/dL (ref 65–99)
Potassium: 4.1 mmol/L (ref 3.5–5.1)
Sodium: 142 mmol/L (ref 135–145)

## 2015-10-25 LAB — URINE MICROSCOPIC-ADD ON

## 2015-10-25 MED ORDER — SODIUM CHLORIDE 0.9 % IV BOLUS (SEPSIS)
1000.0000 mL | Freq: Once | INTRAVENOUS | Status: AC
  Administered 2015-10-25: 1000 mL via INTRAVENOUS

## 2015-10-25 MED ORDER — DEXTROSE 5 % IV SOLN
1.0000 g | Freq: Once | INTRAVENOUS | Status: AC
  Administered 2015-10-25: 1 g via INTRAVENOUS
  Filled 2015-10-25: qty 10

## 2015-10-25 MED ORDER — CEPHALEXIN 500 MG PO CAPS: 500.0000 mg | ORAL_CAPSULE | Freq: Three times a day (TID) | ORAL | Status: DC

## 2015-10-25 NOTE — ED Notes (Signed)
Pt states understanding of care given and follow up instructions.  Pt taken from ED in wheelchair.  Escorted home by significant other and daughter

## 2015-10-25 NOTE — ED Provider Notes (Signed)
CSN: [id number]     Arrival date & time 10/25/15  1622 History   First MD Initiated Contact with Patient 10/25/15 1623     Chief Complaint  Patient presents with  . Fall     (Consider location/radiation/quality/duration/timing/severity/associated sxs/prior Treatment) HPI   70yF  brought in by EMS with back pain. States patient fell Friday approx 9pm and fell after attempting to get up from couch. Ems came to help get her off floor, did not want to come to ED at that time. Complaining of back and bilateral leg pain. Worse with movement. No numbness, tingling or focal loss of strength. No fever or chills. No urinary complaints.   Past Medical History  Diagnosis Date  . Hypothyroidism   . Anxiety   . Arthritis     Bilateral Knee DJD  . Headache(784.0)     SINUS HEADACHE OCCASSIONALLY  . Vertigo   . Multifactorial gait disorder   . Clostridium difficile carrier     pt stated that she has intermittent sx  . Osteoporosis   . Chronic pain   . Hypertension   . Clostridium difficile infection    Past Surgical History  Procedure Laterality Date  . Joint replacement  11/08/3010    right total knee  . Tubal ligation  1982  . Total knee arthroplasty  11/07/2011    Procedure: TOTAL KNEE ARTHROPLASTY;  Surgeon: Nilda Simmerobert A Wainer, MD;  [facility];  Service: Orthopedics;  Laterality: Left;  DR Thurston HoleWAINER WANTS 90 MINUTES FOR THIS CASE   Family History  Problem Relation Age of Onset  . Heart attack Mother   . COPD Father   . Arthritis Sister   . Heart disease Brother    Social History  Substance Use Topics  . Smoking status: Current Every Day Smoker -- 1.00 packs/day for 48 years    Types: Cigarettes    Last Attempt to Quit: 06/15/2012  . Smokeless tobacco: Never Used     Comment: 12/29/14 1/2 PPD  . Alcohol Use: No   OB History    No data available     Review of Systems    Allergies  Iron and Oxycodone  Home Medications   Prior to Admission medications   Medication Sig  Start Date End Date Taking? Authorizing Provider  acidophilus (RISAQUAD) CAPS capsule Take 2 capsules by mouth 3 (three) times daily. 09/09/15   Houston SirenPeter Le, MD  alendronate (FOSAMAX) 70 MG tablet Take 70 mg by mouth every 7 (seven) days.  04/05/13   Historical Provider, MD  ALPRAZolam Prudy Feeler(XANAX) 1 MG tablet  10/01/14   Historical Provider, MD  calcium-vitamin D (OSCAL WITH D) 500-200 MG-UNIT tablet Take 1 tablet by mouth.    Historical Provider, MD  ciprofloxacin (CIPRO) 500 MG tablet Take 1 tablet (500 mg total) by mouth 2 (two) times daily. 09/09/15   Houston SirenPeter Le, MD  levothyroxine (SYNTHROID, LEVOTHROID) 75 MCG tablet Take 75 mcg by mouth daily. 08/29/15   Historical Provider, MD  vitamin B-12 (CYANOCOBALAMIN) 500 MCG tablet Take 500 mcg by mouth daily.    Historical Provider, MD   BP 114/64 mmHg  Pulse 88  Temp(Src) 98 F (36.7 C) (Oral)  Resp 18  Ht 4\' 10"  (1.473 m)  Wt 150 lb (68.04 kg)  BMI 31.36 kg/m2  SpO2 93% Physical Exam  Constitutional: She appears well-developed and well-nourished. No distress.  HENT:  Head: Normocephalic and atraumatic.  Eyes: Conjunctivae are normal. Right eye exhibits no discharge. Left eye exhibits no  discharge.  Neck: Neck supple.  Cardiovascular: Normal rate, regular rhythm and normal heart sounds.  Exam reveals no gallop and no friction rub.   No murmur heard. Pulmonary/Chest: Effort normal and breath sounds normal. No respiratory distress.  Abdominal: Soft. She exhibits no distension. There is no tenderness.  Musculoskeletal: She exhibits no edema or tenderness.  Neurological: She is alert.  Skin: Skin is warm and dry.  Psychiatric: She has a normal mood and affect. Her behavior is normal. Thought content normal.  Nursing note and vitals reviewed.   ED Course  Procedures (including critical care time) Labs Review Labs Reviewed  URINALYSIS, ROUTINE W REFLEX MICROSCOPIC (NOT AT [facility]) - Abnormal; Notable for the following:    APPearance CLOUDY (*)     Hgb urine dipstick LARGE (*)    Protein, ur 30 (*)    Leukocytes, UA MODERATE (*)    All other components within normal limits  BASIC METABOLIC PANEL - Abnormal; Notable for the following:    Creatinine, Ser 1.20 (*)    Calcium 8.4 (*)    GFR calc non Af Amer 44 (*)    GFR calc Af Amer 51 (*)    All other components within normal limits  URINE MICROSCOPIC-ADD ON - Abnormal; Notable for the following:    Squamous Epithelial / LPF 6-30 (*)    Bacteria, UA FEW (*)    All other components within normal limits  URINE CULTURE  CBC WITH DIFFERENTIAL/PLATELET    Imaging Review No results found. I have personally reviewed and evaluated these images and lab results as part of my medical decision-making.   EKG Interpretation   Date/Time:  Sunday October 25 2015 16:49:25 EDT Ventricular Rate:  77 PR Interval:  184 QRS Duration: 91 QT Interval:  398 QTC Calculation: 450 R Axis:   40 Text Interpretation:  Sinus rhythm Low voltage, extremity and precordial  leads Baseline wander in lead(s) V2 No significant change since last  tracing Confirmed by Juleen China  MD, Sircharles Holzheimer ([id number]) on 10/25/2015 5:07:57 PM      MDM   Final diagnoses:  UTI (lower urinary tract infection)        Raeford Razor, MD 11/04/15 [phone]

## 2015-10-25 NOTE — ED Notes (Signed)
Pt brought in by EMS. States patient fell Friday approx 9pm and fell after attempting to get up from couch. Ems came to help get her off floor, did not want to come to ED at that time. Complaining of back and bilateral leg pain. Difficulty walking at this time using walker

## 2015-10-27 LAB — URINE CULTURE: Culture: NO GROWTH

## 2015-11-09 DIAGNOSIS — F17210 Nicotine dependence, cigarettes, uncomplicated: Secondary | ICD-10-CM | POA: Diagnosis not present

## 2015-11-09 DIAGNOSIS — G4733 Obstructive sleep apnea (adult) (pediatric): Secondary | ICD-10-CM | POA: Diagnosis not present

## 2015-11-09 DIAGNOSIS — N390 Urinary tract infection, site not specified: Secondary | ICD-10-CM | POA: Diagnosis not present

## 2015-11-09 DIAGNOSIS — E669 Obesity, unspecified: Secondary | ICD-10-CM | POA: Diagnosis not present

## 2015-11-09 DIAGNOSIS — M8000XD Age-related osteoporosis with current pathological fracture, unspecified site, subsequent encounter for fracture with routine healing: Secondary | ICD-10-CM | POA: Diagnosis not present

## 2015-11-09 DIAGNOSIS — R69 Illness, unspecified: Secondary | ICD-10-CM | POA: Diagnosis not present

## 2015-11-09 DIAGNOSIS — E063 Autoimmune thyroiditis: Secondary | ICD-10-CM | POA: Diagnosis not present

## 2015-11-09 DIAGNOSIS — J449 Chronic obstructive pulmonary disease, unspecified: Secondary | ICD-10-CM | POA: Diagnosis not present

## 2015-11-10 DIAGNOSIS — J449 Chronic obstructive pulmonary disease, unspecified: Secondary | ICD-10-CM | POA: Diagnosis not present

## 2015-11-10 DIAGNOSIS — Z1389 Encounter for screening for other disorder: Secondary | ICD-10-CM | POA: Diagnosis not present

## 2015-11-10 DIAGNOSIS — N390 Urinary tract infection, site not specified: Secondary | ICD-10-CM | POA: Diagnosis not present

## 2015-11-10 DIAGNOSIS — R69 Illness, unspecified: Secondary | ICD-10-CM | POA: Diagnosis not present

## 2015-11-10 DIAGNOSIS — E063 Autoimmune thyroiditis: Secondary | ICD-10-CM | POA: Diagnosis not present

## 2015-11-10 DIAGNOSIS — Z681 Body mass index (BMI) 19 or less, adult: Secondary | ICD-10-CM | POA: Diagnosis not present

## 2015-11-10 DIAGNOSIS — H8113 Benign paroxysmal vertigo, bilateral: Secondary | ICD-10-CM | POA: Diagnosis not present

## 2015-11-10 DIAGNOSIS — J329 Chronic sinusitis, unspecified: Secondary | ICD-10-CM | POA: Diagnosis not present

## 2015-11-12 DIAGNOSIS — R69 Illness, unspecified: Secondary | ICD-10-CM | POA: Diagnosis not present

## 2015-11-12 DIAGNOSIS — F17210 Nicotine dependence, cigarettes, uncomplicated: Secondary | ICD-10-CM | POA: Diagnosis not present

## 2015-11-12 DIAGNOSIS — N390 Urinary tract infection, site not specified: Secondary | ICD-10-CM | POA: Diagnosis not present

## 2015-11-12 DIAGNOSIS — E063 Autoimmune thyroiditis: Secondary | ICD-10-CM | POA: Diagnosis not present

## 2015-11-12 DIAGNOSIS — J449 Chronic obstructive pulmonary disease, unspecified: Secondary | ICD-10-CM | POA: Diagnosis not present

## 2015-11-12 DIAGNOSIS — G4733 Obstructive sleep apnea (adult) (pediatric): Secondary | ICD-10-CM | POA: Diagnosis not present

## 2015-11-12 DIAGNOSIS — M8000XD Age-related osteoporosis with current pathological fracture, unspecified site, subsequent encounter for fracture with routine healing: Secondary | ICD-10-CM | POA: Diagnosis not present

## 2015-11-17 DIAGNOSIS — M8000XD Age-related osteoporosis with current pathological fracture, unspecified site, subsequent encounter for fracture with routine healing: Secondary | ICD-10-CM | POA: Diagnosis not present

## 2015-11-17 DIAGNOSIS — J449 Chronic obstructive pulmonary disease, unspecified: Secondary | ICD-10-CM | POA: Diagnosis not present

## 2015-11-17 DIAGNOSIS — N390 Urinary tract infection, site not specified: Secondary | ICD-10-CM | POA: Diagnosis not present

## 2015-11-17 DIAGNOSIS — G4733 Obstructive sleep apnea (adult) (pediatric): Secondary | ICD-10-CM | POA: Diagnosis not present

## 2015-11-17 DIAGNOSIS — E063 Autoimmune thyroiditis: Secondary | ICD-10-CM | POA: Diagnosis not present

## 2015-11-17 DIAGNOSIS — R69 Illness, unspecified: Secondary | ICD-10-CM | POA: Diagnosis not present

## 2015-11-17 DIAGNOSIS — F17210 Nicotine dependence, cigarettes, uncomplicated: Secondary | ICD-10-CM | POA: Diagnosis not present

## 2015-11-19 DIAGNOSIS — N390 Urinary tract infection, site not specified: Secondary | ICD-10-CM | POA: Diagnosis not present

## 2015-11-19 DIAGNOSIS — F17210 Nicotine dependence, cigarettes, uncomplicated: Secondary | ICD-10-CM | POA: Diagnosis not present

## 2015-11-19 DIAGNOSIS — J449 Chronic obstructive pulmonary disease, unspecified: Secondary | ICD-10-CM | POA: Diagnosis not present

## 2015-11-19 DIAGNOSIS — M8000XD Age-related osteoporosis with current pathological fracture, unspecified site, subsequent encounter for fracture with routine healing: Secondary | ICD-10-CM | POA: Diagnosis not present

## 2015-11-19 DIAGNOSIS — R69 Illness, unspecified: Secondary | ICD-10-CM | POA: Diagnosis not present

## 2015-11-19 DIAGNOSIS — G4733 Obstructive sleep apnea (adult) (pediatric): Secondary | ICD-10-CM | POA: Diagnosis not present

## 2015-11-19 DIAGNOSIS — E063 Autoimmune thyroiditis: Secondary | ICD-10-CM | POA: Diagnosis not present

## 2015-11-23 DIAGNOSIS — M8000XD Age-related osteoporosis with current pathological fracture, unspecified site, subsequent encounter for fracture with routine healing: Secondary | ICD-10-CM | POA: Diagnosis not present

## 2015-11-23 DIAGNOSIS — F17210 Nicotine dependence, cigarettes, uncomplicated: Secondary | ICD-10-CM | POA: Diagnosis not present

## 2015-11-23 DIAGNOSIS — G4733 Obstructive sleep apnea (adult) (pediatric): Secondary | ICD-10-CM | POA: Diagnosis not present

## 2015-11-23 DIAGNOSIS — E063 Autoimmune thyroiditis: Secondary | ICD-10-CM | POA: Diagnosis not present

## 2015-11-23 DIAGNOSIS — R69 Illness, unspecified: Secondary | ICD-10-CM | POA: Diagnosis not present

## 2015-11-23 DIAGNOSIS — J449 Chronic obstructive pulmonary disease, unspecified: Secondary | ICD-10-CM | POA: Diagnosis not present

## 2015-11-23 DIAGNOSIS — N390 Urinary tract infection, site not specified: Secondary | ICD-10-CM | POA: Diagnosis not present

## 2015-11-25 DIAGNOSIS — J449 Chronic obstructive pulmonary disease, unspecified: Secondary | ICD-10-CM | POA: Diagnosis not present

## 2015-11-25 DIAGNOSIS — R69 Illness, unspecified: Secondary | ICD-10-CM | POA: Diagnosis not present

## 2015-11-25 DIAGNOSIS — M8000XD Age-related osteoporosis with current pathological fracture, unspecified site, subsequent encounter for fracture with routine healing: Secondary | ICD-10-CM | POA: Diagnosis not present

## 2015-11-25 DIAGNOSIS — E063 Autoimmune thyroiditis: Secondary | ICD-10-CM | POA: Diagnosis not present

## 2015-11-25 DIAGNOSIS — F17210 Nicotine dependence, cigarettes, uncomplicated: Secondary | ICD-10-CM | POA: Diagnosis not present

## 2015-11-25 DIAGNOSIS — G4733 Obstructive sleep apnea (adult) (pediatric): Secondary | ICD-10-CM | POA: Diagnosis not present

## 2015-11-25 DIAGNOSIS — N390 Urinary tract infection, site not specified: Secondary | ICD-10-CM | POA: Diagnosis not present

## 2016-01-29 ENCOUNTER — Encounter (HOSPITAL_COMMUNITY): Payer: Self-pay | Admitting: Emergency Medicine

## 2016-01-29 ENCOUNTER — Emergency Department (HOSPITAL_COMMUNITY): Payer: Medicare HMO

## 2016-01-29 ENCOUNTER — Observation Stay (HOSPITAL_COMMUNITY)
Admission: EM | Admit: 2016-01-29 | Discharge: 2016-02-01 | Disposition: A | Discharge status: Home / Self Care | Payer: Medicare HMO | Attending: Internal Medicine | Admitting: Internal Medicine

## 2016-01-29 DIAGNOSIS — E039 Hypothyroidism, unspecified: Secondary | ICD-10-CM | POA: Insufficient documentation

## 2016-01-29 DIAGNOSIS — R69 Illness, unspecified: Secondary | ICD-10-CM | POA: Diagnosis not present

## 2016-01-29 DIAGNOSIS — R531 Weakness: Secondary | ICD-10-CM | POA: Diagnosis not present

## 2016-01-29 DIAGNOSIS — M170 Bilateral primary osteoarthritis of knee: Secondary | ICD-10-CM | POA: Diagnosis not present

## 2016-01-29 DIAGNOSIS — N390 Urinary tract infection, site not specified: Principal | ICD-10-CM | POA: Diagnosis present

## 2016-01-29 DIAGNOSIS — Z5181 Encounter for therapeutic drug level monitoring: Secondary | ICD-10-CM | POA: Insufficient documentation

## 2016-01-29 DIAGNOSIS — F17210 Nicotine dependence, cigarettes, uncomplicated: Secondary | ICD-10-CM | POA: Insufficient documentation

## 2016-01-29 DIAGNOSIS — N3000 Acute cystitis without hematuria: Secondary | ICD-10-CM

## 2016-01-29 DIAGNOSIS — I10 Essential (primary) hypertension: Secondary | ICD-10-CM | POA: Insufficient documentation

## 2016-01-29 DIAGNOSIS — R29898 Other symptoms and signs involving the musculoskeletal system: Secondary | ICD-10-CM

## 2016-01-29 DIAGNOSIS — R4781 Slurred speech: Secondary | ICD-10-CM | POA: Diagnosis not present

## 2016-01-29 DIAGNOSIS — Z79899 Other long term (current) drug therapy: Secondary | ICD-10-CM | POA: Insufficient documentation

## 2016-01-29 DIAGNOSIS — Z7982 Long term (current) use of aspirin: Secondary | ICD-10-CM | POA: Diagnosis not present

## 2016-01-29 LAB — DIFFERENTIAL
BASOS ABS: 0 10*3/uL (ref 0.0–0.1)
Basophils Relative: 0 %
EOS PCT: 1 %
Eosinophils Absolute: 0.1 10*3/uL (ref 0.0–0.7)
LYMPHS ABS: 1.9 10*3/uL (ref 0.7–4.0)
LYMPHS PCT: 24 %
MONOS PCT: 10 %
Monocytes Absolute: 0.8 10*3/uL (ref 0.1–1.0)
NEUTROS PCT: 65 %
Neutro Abs: 5.2 10*3/uL (ref 1.7–7.7)

## 2016-01-29 LAB — COMPREHENSIVE METABOLIC PANEL
ALBUMIN: 3.6 g/dL (ref 3.5–5.0)
ALK PHOS: 80 U/L (ref 38–126)
ALT: 28 U/L (ref 14–54)
AST: 22 U/L (ref 15–41)
Anion gap: 6 (ref 5–15)
BILIRUBIN TOTAL: 0.5 mg/dL (ref 0.3–1.2)
BUN: 19 mg/dL (ref 6–20)
CALCIUM: 9.1 mg/dL (ref 8.9–10.3)
CO2: 27 mmol/L (ref 22–32)
CREATININE: 1.26 mg/dL — AB (ref 0.44–1.00)
Chloride: 106 mmol/L (ref 101–111)
GFR calc Af Amer: 48 mL/min — ABNORMAL LOW (ref >60)
GFR, EST NON AFRICAN AMERICAN: 42 mL/min — AB (ref >60)
GLUCOSE: 141 mg/dL — AB (ref 65–99)
Potassium: 3.5 mmol/L (ref 3.5–5.1)
Sodium: 139 mmol/L (ref 135–145)
TOTAL PROTEIN: 7 g/dL (ref 6.5–8.1)

## 2016-01-29 LAB — URINE MICROSCOPIC-ADD ON

## 2016-01-29 LAB — URINALYSIS, ROUTINE W REFLEX MICROSCOPIC
BILIRUBIN URINE: NEGATIVE
Glucose, UA: NEGATIVE mg/dL
Ketones, ur: NEGATIVE mg/dL
NITRITE: NEGATIVE
SPECIFIC GRAVITY, URINE: 1.01 (ref 1.005–1.030)
pH: 6.5 (ref 5.0–8.0)

## 2016-01-29 LAB — CBC
HEMATOCRIT: 39.2 % (ref 36.0–46.0)
HEMOGLOBIN: 13.1 g/dL (ref 12.0–15.0)
MCH: 31.2 pg (ref 26.0–34.0)
MCHC: 33.4 g/dL (ref 30.0–36.0)
MCV: 93.3 fL (ref 78.0–100.0)
Platelets: 236 10*3/uL (ref 150–400)
RBC: 4.2 MIL/uL (ref 3.87–5.11)
RDW: 15.1 % (ref 11.5–15.5)
WBC: 8.1 10*3/uL (ref 4.0–10.5)

## 2016-01-29 LAB — RAPID URINE DRUG SCREEN, HOSP PERFORMED
Amphetamines: NOT DETECTED
Barbiturates: NOT DETECTED
Benzodiazepines: POSITIVE — AB
Cocaine: NOT DETECTED
Opiates: NOT DETECTED
Tetrahydrocannabinol: NOT DETECTED

## 2016-01-29 LAB — PROTIME-INR
INR: 1 (ref 0.00–1.49)
Prothrombin Time: 13.4 seconds (ref 11.6–15.2)

## 2016-01-29 LAB — ETHANOL: Alcohol, Ethyl (B): <5 mg/dL (ref <5)

## 2016-01-29 LAB — CBG MONITORING, ED: Glucose-Capillary: 149 mg/dL — ABNORMAL HIGH (ref 65–99)

## 2016-01-29 LAB — TROPONIN I

## 2016-01-29 LAB — APTT: aPTT: 46 seconds — ABNORMAL HIGH (ref 24–37)

## 2016-01-29 MED ORDER — ACETAMINOPHEN 650 MG RE SUPP: 650.0000 mg | Freq: Four times a day (QID) | RECTAL | Status: DC | PRN

## 2016-01-29 MED ORDER — ONDANSETRON HCL 4 MG PO TABS: 4.0000 mg | ORAL_TABLET | Freq: Four times a day (QID) | ORAL | Status: DC | PRN

## 2016-01-29 MED ORDER — POTASSIUM CHLORIDE IN NACL 20-0.9 MEQ/L-% IV SOLN
INTRAVENOUS | Status: DC
  Administered 2016-01-29: 19:00:00 via INTRAVENOUS
  Administered 2016-01-30: 1000 mL via INTRAVENOUS
  Administered 2016-01-30: 21:00:00 via INTRAVENOUS

## 2016-01-29 MED ORDER — RISAQUAD PO CAPS
2.0000 | ORAL_CAPSULE | Freq: Three times a day (TID) | ORAL | Status: DC
  Administered 2016-01-29 – 2016-02-01 (×9): 2 via ORAL
  Filled 2016-01-29 (×9): qty 2

## 2016-01-29 MED ORDER — ATORVASTATIN CALCIUM 20 MG PO TABS
20.0000 mg | ORAL_TABLET | Freq: Every day | ORAL | Status: DC
  Administered 2016-01-30 – 2016-01-31 (×2): 20 mg via ORAL
  Filled 2016-01-29 (×2): qty 1

## 2016-01-29 MED ORDER — ENSURE ENLIVE PO LIQD
237.0000 mL | Freq: Two times a day (BID) | ORAL | Status: DC
  Administered 2016-01-30 – 2016-02-01 (×4): 237 mL via ORAL

## 2016-01-29 MED ORDER — ENOXAPARIN SODIUM 40 MG/0.4ML ~~LOC~~ SOLN
40.0000 mg | SUBCUTANEOUS | Status: DC
  Administered 2016-01-29 – 2016-01-31 (×3): 40 mg via SUBCUTANEOUS
  Filled 2016-01-29 (×3): qty 0.4

## 2016-01-29 MED ORDER — DEXTROSE 5 % IV SOLN
1.0000 g | INTRAVENOUS | Status: DC
  Administered 2016-01-30 – 2016-02-01 (×3): 1 g via INTRAVENOUS
  Filled 2016-01-29 (×3): qty 10

## 2016-01-29 MED ORDER — ASPIRIN 81 MG PO CHEW
81.0000 mg | CHEWABLE_TABLET | Freq: Every day | ORAL | Status: DC
  Administered 2016-01-30 – 2016-02-01 (×3): 81 mg via ORAL
  Filled 2016-01-29 (×3): qty 1

## 2016-01-29 MED ORDER — ALPRAZOLAM 1 MG PO TABS
1.0000 mg | ORAL_TABLET | Freq: Three times a day (TID) | ORAL | Status: DC
  Administered 2016-01-29 – 2016-02-01 (×10): 1 mg via ORAL
  Filled 2016-01-29 (×10): qty 1

## 2016-01-29 MED ORDER — LEVOTHYROXINE SODIUM 75 MCG PO TABS
75.0000 ug | ORAL_TABLET | Freq: Every day | ORAL | Status: DC
  Administered 2016-01-30 – 2016-02-01 (×3): 75 ug via ORAL
  Filled 2016-01-29 (×3): qty 1

## 2016-01-29 MED ORDER — ONDANSETRON HCL 4 MG/2ML IJ SOLN: 4.0000 mg | Freq: Four times a day (QID) | INTRAMUSCULAR | Status: DC | PRN

## 2016-01-29 MED ORDER — ACETAMINOPHEN 325 MG PO TABS: 650.0000 mg | ORAL_TABLET | Freq: Four times a day (QID) | ORAL | Status: DC | PRN

## 2016-01-29 MED ORDER — POLYETHYLENE GLYCOL 3350 17 G PO PACK
17.0000 g | PACK | Freq: Every day | ORAL | Status: DC | PRN
  Administered 2016-02-01: 17 g via ORAL
  Filled 2016-01-29: qty 1

## 2016-01-29 MED ORDER — PROBIOTIC PO CAPS: ORAL_CAPSULE | Freq: Every day | ORAL | Status: DC

## 2016-01-29 MED ORDER — CEFTRIAXONE SODIUM 1 G IJ SOLR
1.0000 g | Freq: Once | INTRAMUSCULAR | Status: AC
  Administered 2016-01-29: 1 g via INTRAVENOUS
  Filled 2016-01-29: qty 10

## 2016-01-29 MED ORDER — FUROSEMIDE 20 MG PO TABS
20.0000 mg | ORAL_TABLET | Freq: Every day | ORAL | Status: DC
  Administered 2016-01-30 – 2016-02-01 (×3): 20 mg via ORAL
  Filled 2016-01-29 (×3): qty 1

## 2016-01-29 NOTE — ED Notes (Signed)
Pt going to Xray, then MRI afterwards.

## 2016-01-29 NOTE — ED Notes (Signed)
Slurred speech times 1 weeks.  Right arm and right leg weakness times 2 weeks.

## 2016-01-29 NOTE — ED Provider Notes (Signed)
CSN: [id number]     Arrival date & time 01/29/16  1119 History   First MD Initiated Contact with Patient 01/29/16 1136     Chief Complaint  Patient presents with  . Extremity Weakness      HPI Pt was seen at 1205. Per pt and her family, c/o gradual onset and worsening of persistent right sided weakness and slurred speech for the past 1 to 2 weeks. Pt's family states pt "can't walk" due to the weakness in her RLE. Pt has been able to eat without dysphagia. Denies tingling/numbness in extremities, no visual changes, no CP/SOB, no abd pain, no N/V/D, no rash, no fevers.     Past Medical History  Diagnosis Date  . Hypothyroidism   . Anxiety   . Arthritis     Bilateral Knee DJD  . Headache(784.0)     SINUS HEADACHE OCCASSIONALLY  . Vertigo   . Multifactorial gait disorder   . Clostridium difficile carrier     pt stated that she has intermittent sx  . Osteoporosis   . Chronic pain   . Hypertension   . Clostridium difficile infection    Past Surgical History  Procedure Laterality Date  . Joint replacement  11/08/3010    right total knee  . Tubal ligation  1982  . Total knee arthroplasty  11/07/2011    Procedure: TOTAL KNEE ARTHROPLASTY;  Surgeon: Nilda Simmerobert A Wainer, MD;  [facility];  Service: Orthopedics;  Laterality: Left;  DR Thurston HoleWAINER WANTS 90 MINUTES FOR THIS CASE   Family History  Problem Relation Age of Onset  . Heart attack Mother   . COPD Father   . Arthritis Sister   . Heart disease Brother    Social History  Substance Use Topics  . Smoking status: Current Every Day Smoker -- 1.00 packs/day for 48 years    Types: Cigarettes    Last Attempt to Quit: 06/15/2012  . Smokeless tobacco: Never Used     Comment: 12/29/14 1/2 PPD  . Alcohol Use: No    Review of Systems ROS: Statement: All systems negative except as marked or noted in the HPI; Constitutional: Negative for fever and chills. ; ; Eyes: Negative for eye pain, redness and discharge. ; ; ENMT: Negative for ear  pain, hoarseness, nasal congestion, sinus pressure and sore throat. ; ; Cardiovascular: Negative for chest pain, palpitations, diaphoresis, dyspnea and peripheral edema. ; ; Respiratory: Negative for cough, wheezing and stridor. ; ; Gastrointestinal: Negative for nausea, vomiting, diarrhea, abdominal pain, blood in stool, hematemesis, jaundice and rectal bleeding. . ; ; Genitourinary: Negative for dysuria, flank pain and hematuria. ; ; Musculoskeletal: Negative for back pain and neck pain. Negative for swelling and trauma.; ; Skin: Negative for pruritus, rash, abrasions, blisters, bruising and skin lesion.; ; Neuro: +extremity weakness, slurred speech. Negative for headache, lightheadedness and neck stiffness. Negative for weakness, altered level of consciousness, altered mental status, paresthesias, involuntary movement, seizure and syncope.     Allergies  Iron and Oxycodone  Home Medications   Prior to Admission medications   Medication Sig Start Date End Date Taking? Authorizing Provider  acidophilus (RISAQUAD) CAPS capsule Take 2 capsules by mouth 3 (three) times daily. 09/09/15  Yes Houston SirenPeter Le, MD  alendronate (FOSAMAX) 70 MG tablet Take 70 mg by mouth every 7 (seven) days.  04/05/13  Yes Historical Provider, MD  ALPRAZolam Prudy Feeler(XANAX) 1 MG tablet Take 1 mg by mouth 3 (three) times daily.  10/01/14  Yes Historical Provider, MD  aspirin 81 MG tablet Take 81 mg by mouth daily.   Yes Historical Provider, MD  atorvastatin (LIPITOR) 20 MG tablet Take 20 mg by mouth at bedtime. 09/14/15  Yes Historical Provider, MD  calcium-vitamin D (OSCAL WITH D) 500-200 MG-UNIT tablet Take 1 tablet by mouth daily with breakfast.    Yes Historical Provider, MD  furosemide (LASIX) 20 MG tablet Take 1 tablet by mouth daily. 01/23/16  Yes Historical Provider, MD  levothyroxine (SYNTHROID, LEVOTHROID) 75 MCG tablet Take 75 mcg by mouth daily. 08/29/15  Yes Historical Provider, MD  naproxen sodium (ALEVE) 220 MG tablet Take 440 mg  by mouth 2 (two) times daily with a meal.   Yes Historical Provider, MD  Probiotic Product (PROBIOTIC PO) Take 1 capsule by mouth daily.   Yes Historical Provider, MD  vitamin B-12 (CYANOCOBALAMIN) 500 MCG tablet Take 500 mcg by mouth daily.   Yes Historical Provider, MD  cephALEXin (KEFLEX) 500 MG capsule Take 1 capsule (500 mg total) by mouth 3 (three) times daily. Patient not taking: Reported on 01/29/2016 10/25/15   Raeford Razor, MD  ciprofloxacin (CIPRO) 500 MG tablet Take 1 tablet (500 mg total) by mouth 2 (two) times daily. Patient not taking: Reported on 10/25/2015 09/09/15   Houston Siren, MD   BP 109/67 mmHg  Pulse 80  Temp(Src) 97.9 F (36.6 C) (Oral)  Resp 18  Ht 4\' 11"  (1.499 m)  Wt 150 lb (68.04 kg)  BMI 30.28 kg/m2  SpO2 92% Physical Exam  1210: Physical examination:  Nursing notes reviewed; Vital signs and O2 SAT reviewed;  Constitutional: Well developed, Well nourished, Well hydrated, In no acute distress; Head:  Normocephalic, atraumatic; Eyes: EOMI, PERRL, No scleral icterus; ENMT: Mouth and pharynx normal, Mucous membranes moist; Neck: Supple, Full range of motion, No lymphadenopathy; Cardiovascular: Regular rate and rhythm, No gallop; Respiratory: Breath sounds clear & equal bilaterally, No wheezes.  Speaking full sentences with ease, Normal respiratory effort/excursion; Chest: Nontender, Movement normal; Abdomen: Soft, Nontender, Nondistended, Normal bowel sounds; Genitourinary: No CVA tenderness; Spine:  No midline CS, TS, LS tenderness.;; Extremities: Pulses normal, No tenderness, No edema, No calf edema or asymmetry.; Neuro: AA&Ox3, vague historian.  Major CN grossly intact. Speech slurred.  No facial droop. Grips weak bilaterally. Strength 4/5 equal bilat UE's and LLE, strength 3-4/5 RLE. No gross sensory deficits.  Normal cerebellar testing bilat UE's (finger-nose) and LLE (heel-shin); unable to perform RLE due to weakness.; Skin: Color normal, Warm, Dry.   ED Course   Procedures (including critical care time) Labs Review  Imaging Review  I have personally reviewed and evaluated these images and lab results as part of my medical decision-making.   EKG Interpretation   Date/Time:  Friday January 29 2016 12:20:12 EDT Ventricular Rate:  76 PR Interval:    QRS Duration: 95 QT Interval:  400 QTC Calculation: 450 R Axis:   37 Text Interpretation:  Sinus rhythm Low voltage, extremity and precordial  leads When compared with ECG of 10/25/2015 No significant change was found  Confirmed by [facility]  MD, Nicholos Johns [phone]) on 01/29/2016 12:30:54 PM      MDM  MDM Reviewed: previous chart, nursing note and vitals Reviewed previous: labs and ECG Interpretation: labs, ECG, x-ray and MRI      Results for orders placed or performed during the hospital encounter of 01/29/16  Ethanol  Result Value Ref Range   Alcohol, Ethyl (B) <5 <5 mg/dL  Protime-INR  Result Value Ref Range   Prothrombin Time 13.4  11.6 - 15.2 seconds   INR 1.00 0.00 - 1.49  APTT  Result Value Ref Range   aPTT 46 (H) 24 - 37 seconds  CBC  Result Value Ref Range   WBC 8.1 4.0 - 10.5 K/uL   RBC 4.20 3.87 - 5.11 MIL/uL   Hemoglobin 13.1 12.0 - 15.0 g/dL   HCT 16.1 09.6 - 04.5 %   MCV 93.3 78.0 - 100.0 fL   MCH 31.2 26.0 - 34.0 pg   MCHC 33.4 30.0 - 36.0 g/dL   RDW 40.9 81.1 - 91.4 %   Platelets 236 150 - 400 K/uL  Differential  Result Value Ref Range   Neutrophils Relative % 65 %   Neutro Abs 5.2 1.7 - 7.7 K/uL   Lymphocytes Relative 24 %   Lymphs Abs 1.9 0.7 - 4.0 K/uL   Monocytes Relative 10 %   Monocytes Absolute 0.8 0.1 - 1.0 K/uL   Eosinophils Relative 1 %   Eosinophils Absolute 0.1 0.0 - 0.7 K/uL   Basophils Relative 0 %   Basophils Absolute 0.0 0.0 - 0.1 K/uL  Comprehensive metabolic panel  Result Value Ref Range   Sodium 139 135 - 145 mmol/L   Potassium 3.5 3.5 - 5.1 mmol/L   Chloride 106 101 - 111 mmol/L   CO2 27 22 - 32 mmol/L   Glucose, Bld 141 (H) 65 - 99  mg/dL   BUN 19 6 - 20 mg/dL   Creatinine, Ser 7.82 (H) 0.44 - 1.00 mg/dL   Calcium 9.1 8.9 - 95.6 mg/dL   Total Protein 7.0 6.5 - 8.1 g/dL   Albumin 3.6 3.5 - 5.0 g/dL   AST 22 15 - 41 U/L   ALT 28 14 - 54 U/L   Alkaline Phosphatase 80 38 - 126 U/L   Total Bilirubin 0.5 0.3 - 1.2 mg/dL   GFR calc non Af Amer 42 (L) >60 mL/min   GFR calc Af Amer 48 (L) >60 mL/min   Anion gap 6 5 - 15  Urine rapid drug screen (hosp performed)not at [facility]  Result Value Ref Range   Opiates NONE DETECTED NONE DETECTED   Cocaine NONE DETECTED NONE DETECTED   Benzodiazepines POSITIVE (A) NONE DETECTED   Amphetamines NONE DETECTED NONE DETECTED   Tetrahydrocannabinol NONE DETECTED NONE DETECTED   Barbiturates NONE DETECTED NONE DETECTED  Urinalysis, Routine w reflex microscopic (not at [facility])  Result Value Ref Range   Color, Urine YELLOW YELLOW   APPearance HAZY (A) CLEAR   Specific Gravity, Urine 1.010 1.005 - 1.030   pH 6.5 5.0 - 8.0   Glucose, UA NEGATIVE NEGATIVE mg/dL   Hgb urine dipstick LARGE (A) NEGATIVE   Bilirubin Urine NEGATIVE NEGATIVE   Ketones, ur NEGATIVE NEGATIVE mg/dL   Protein, ur TRACE (A) NEGATIVE mg/dL   Nitrite NEGATIVE NEGATIVE   Leukocytes, UA LARGE (A) NEGATIVE  Troponin I  Result Value Ref Range   Troponin I <0.03 <0.03 ng/mL  Urine microscopic-add on  Result Value Ref Range   Squamous Epithelial / LPF 0-5 (A) NONE SEEN   WBC, UA TOO NUMEROUS TO COUNT 0 - 5 WBC/hpf   RBC / HPF 6-30 0 - 5 RBC/hpf   Bacteria, UA MANY (A) NONE SEEN  CBG monitoring, ED  Result Value Ref Range   Glucose-Capillary 149 (H) 65 - 99 mg/dL   Dg Chest 2 View 08/31/863  CLINICAL DATA:  Slurred speech and right-sided weakness EXAM: CHEST  2 VIEW COMPARISON:  October 25, 2015 FINDINGS: There is no edema or consolidation. The heart size and pulmonary vascularity are normal. No adenopathy. There is degenerative change in the mid thoracic spine. IMPRESSION: No edema or consolidation. Electronically Signed    By: Bretta Bang III M.D.   On: 01/29/2016 12:45   Mr Brain Wo Contrast (neuro Protocol) 01/29/2016  CLINICAL DATA:  70 year old female with slurred speech and right side weakness. Symptoms for 1-2 weeks. Initial encounter. EXAM: MRI HEAD WITHOUT CONTRAST TECHNIQUE: Multiplanar, multiecho pulse sequences of the brain and surrounding structures were obtained without intravenous contrast. COMPARISON:  Noncontrast head CT 11/01/2013. FINDINGS: No restricted diffusion to suggest acute infarction. No midline shift, mass effect, evidence of mass lesion, extra-axial collection or acute intracranial hemorrhage. Cervicomedullary junction and pituitary are within normal limits. Major intracranial vascular flow voids are preserved. Mild ventriculomegaly involving the lateral and third ventricles appears stable since 2015. No transependymal edema, an the temporal horns are relatively decompressed. The fourth ventricle is stable and has a more normal appearance. Mild periventricular and other mild to moderate scattered bilateral cerebral white matter T2 and FLAIR hyperintensity which is nonspecific. However, there are small chronic lacunar infarcts in both caudate nuclei. No cortical encephalomalacia or chronic cerebral blood products identified. Other deep gray matter nuclei, the brainstem, and cerebellum appear normal for age. Visible internal auditory structures appear normal. Trace mastoid fluid. Negative nasopharynx. Paranasal sinuses are well pneumatized. Negative orbit and scalp soft tissues. IMPRESSION: 1.  No acute intracranial abnormality. 2. Signal changes compatible with mild to moderate for age chronic small vessel disease. 3. Chronic lateral and third ventricular enlargement is stable since 2015 and nonspecific, but favor this is ex vacuo in nature. Electronically Signed   By: Odessa Fleming M.D.   On: 01/29/2016 13:18    1510:  Pt cannot stand without heavy assist x3. +UTI, UC pending; will dose IV rocephin.  Dx and testing d/w pt and family.  Questions answered.  Verb understanding, agreeable to admit.  T/C to [facility]. Adrian Blackwater, case discussed, including:  HPI, pertinent PM/SHx, VS/PE, dx testing, ED course and treatment:  Agreeable to admit, requests to write temporary orders, obtain medical bed to team APAdmits.   Samuel Jester, DO 01/31/16 [phone]

## 2016-01-29 NOTE — ED Notes (Signed)
Tried to ambulate pt; pt states she cannot stand or walk because her legs are "weak and giving out".

## 2016-01-29 NOTE — ED Notes (Signed)
Attempted to ambulate pt to bedside commode. Pt reports feeling to weak to stand at this time. Upon pt's arrival, pt extremely weak and unsteady ambulating from wheelchair to the bed. Dr. Clarene DukeMcManus notified.

## 2016-01-29 NOTE — H&P (Signed)
History and Physical  Amanda Armstrong:[id number] DOB: [birth year] DOA: 01/29/2016  Referring physician: Dr Richrd Prime, ED physician PCP: Cassell Smiles., MD  Outpatient Specialists: none  Chief Complaint: Weakness  HPI: Amanda Armstrong is a 70 y.o. female with a history of hypertension, chronic pain, hypothyroidism, osteoporosis, history of C. difficile infection. Patient seen for 2 weeks of worsening weakness in her legs bilaterally, especially over the past 2-3 days. Patient normally walks with a walker, but has been unable to ambulate without a lot of assistance the past couple of days. No palliating or provoking factors. Patient is also had extreme frequency and moderate nonradiating lower abdominal pain. She does complain of lack of appetite, but no fevers, chills, nausea, vomiting.   Review of Systems:   Pt denies any fevers, chills, nausea, vomiting, diarrhea, constipation, abdominal pain, shortness of breath, dyspnea on exertion, orthopnea, cough, wheezing, palpitations, headache, vision changes, lightheadedness, dizziness, melena, rectal bleeding.  Review of systems are otherwise negative  Past Medical History  Diagnosis Date  . Hypothyroidism   . Anxiety   . Arthritis     Bilateral Knee DJD  . Headache(784.0)     SINUS HEADACHE OCCASSIONALLY  . Vertigo   . Multifactorial gait disorder   . Clostridium difficile carrier     pt stated that she has intermittent sx  . Osteoporosis   . Chronic pain   . Hypertension   . Clostridium difficile infection    Past Surgical History  Procedure Laterality Date  . Joint replacement  11/08/3010    right total knee  . Tubal ligation  1982  . Total knee arthroplasty  11/07/2011    Procedure: TOTAL KNEE ARTHROPLASTY;  Surgeon: Nilda Simmer, MD;  Location: [facility] OR;  Service: Orthopedics;  Laterality: Left;  DR Anne Ng 90 MINUTES FOR THIS CASE   Social History:  reports that she has been smoking Cigarettes.  She has a 48  pack-year smoking history. She has never used smokeless tobacco. She reports that she does not drink alcohol or use illicit drugs. Patient lives at Home  Allergies  Allergen Reactions  . Iron Other (See Comments)    "upset stomach"  . Oxycodone Other (See Comments)    "CX HER TO FELL LIKE SHE IS OUT OF HER BODY"    Family History  Problem Relation Age of Onset  . Heart attack Mother   . COPD Father   . Arthritis Sister   . Heart disease Brother      Prior to Admission medications   Medication Sig Start Date End Date Taking? Authorizing Provider  acidophilus (RISAQUAD) CAPS capsule Take 2 capsules by mouth 3 (three) times daily. 09/09/15  Yes Houston Siren, MD  alendronate (FOSAMAX) 70 MG tablet Take 70 mg by mouth every 7 (seven) days.  04/05/13  Yes Historical Provider, MD  ALPRAZolam Prudy Feeler) 1 MG tablet Take 1 mg by mouth 3 (three) times daily.  10/01/14  Yes Historical Provider, MD  aspirin 81 MG tablet Take 81 mg by mouth daily.   Yes Historical Provider, MD  atorvastatin (LIPITOR) 20 MG tablet Take 20 mg by mouth at bedtime. 09/14/15  Yes Historical Provider, MD  calcium-vitamin D (OSCAL WITH D) 500-200 MG-UNIT tablet Take 1 tablet by mouth daily with breakfast.    Yes Historical Provider, MD  furosemide (LASIX) 20 MG tablet Take 1 tablet by mouth daily. 01/23/16  Yes Historical Provider, MD  levothyroxine (SYNTHROID, LEVOTHROID) 75 MCG tablet Take 75 mcg by mouth  daily. 08/29/15  Yes Historical Provider, MD  naproxen sodium (ALEVE) 220 MG tablet Take 440 mg by mouth 2 (two) times daily with a meal.   Yes Historical Provider, MD  Probiotic Product (PROBIOTIC PO) Take 1 capsule by mouth daily.   Yes Historical Provider, MD  vitamin B-12 (CYANOCOBALAMIN) 500 MCG tablet Take 500 mcg by mouth daily.   Yes Historical Provider, MD    Physical Exam: BP 106/59 mmHg  Pulse 77  Temp(Src) 98 F (36.7 C) (Oral)  Resp 16  Ht  (1.499 m)  Wt 68.04 kg (150 lb)  BMI 30.28 kg/m2  SpO2  95%  General: Older Caucasian female. Awake and alert and oriented x3. No acute cardiopulmonary distress.  HEENT: Normocephalic atraumatic.  Right and left ears normal in appearance.  Pupils equal, round, reactive to light. Extraocular muscles are intact. Sclerae anicteric and noninjected.  Moist mucosal membranes. No mucosal lesions.  Neck: Neck supple without lymphadenopathy. No carotid bruits. No masses palpated.  Cardiovascular: Regular rate with normal S1-S2 sounds. No murmurs, rubs, gallops auscultated. No JVD.  Respiratory: Good respiratory effort with no wheezes, rales, rhonchi. Lungs clear to auscultation bilaterally.  No accessory muscle use. Abdomen: Soft, nontender, nondistended. Active bowel sounds. No masses or hepatosplenomegaly  Skin: No rashes, lesions, or ulcerations.  Dry, warm to touch. 2+ dorsalis pedis and radial pulses. Musculoskeletal: No calf or leg pain. All major joints not erythematous nontender.  No upper or lower joint deformation.  Good ROM.  No contractures  Psychiatric: Intact judgment and insight. Pleasant and cooperative. Neurologic: Globally weak: Strength is 4/5 and symmetric in upper and lower extremities.  Cranial nerves II through XII are grossly intact.           Labs on Admission: I have personally reviewed following labs and imaging studies  CBC:  Recent Labs Lab 01/29/16 1224  WBC 8.1  NEUTROABS 5.2  HGB 13.1  HCT 39.2  MCV 93.3  PLT 236   Basic Metabolic Panel:  Recent Labs Lab 01/29/16 1224  NA 139  K 3.5  CL 106  CO2 27  GLUCOSE 141*  BUN 19  CREATININE 1.26*  CALCIUM 9.1   GFR: Estimated Creatinine Clearance: 33.8 mL/min (by C-G formula based on Cr of 1.26). Liver Function Tests:  Recent Labs Lab 01/29/16 1224  AST 22  ALT 28  ALKPHOS 80  BILITOT 0.5  PROT 7.0  ALBUMIN 3.6   No results for input(s): LIPASE, AMYLASE in the last 168 hours. No results for input(s): AMMONIA in the last 168 hours. Coagulation  Profile:  Recent Labs Lab 01/29/16 1224  INR 1.00   Cardiac Enzymes:  Recent Labs Lab 01/29/16 1224  TROPONINI <0.03   BNP (last 3 results) No results for input(s): PROBNP in the last 8760 hours. HbA1C: No results for input(s): HGBA1C in the last 72 hours. CBG:  Recent Labs Lab 01/29/16 1133  GLUCAP 149*   Lipid Profile: No results for input(s): CHOL, HDL, LDLCALC, TRIG, CHOLHDL, LDLDIRECT in the last 72 hours. Thyroid Function Tests: No results for input(s): TSH, T4TOTAL, FREET4, T3FREE, THYROIDAB in the last 72 hours. Anemia Panel: No results for input(s): VITAMINB12, FOLATE, FERRITIN, TIBC, IRON, RETICCTPCT in the last 72 hours. Urine analysis:    Component Value Date/Time   COLORURINE YELLOW 01/29/2016 1421   APPEARANCEUR HAZY* 01/29/2016 1421   LABSPEC 1.010 01/29/2016 1421   PHURINE 6.5 01/29/2016 1421   GLUCOSEU NEGATIVE 01/29/2016 1421   HGBUR LARGE* 01/29/2016 1421  BILIRUBINUR NEGATIVE 01/29/2016 1421   KETONESUR NEGATIVE 01/29/2016 1421   PROTEINUR TRACE* 01/29/2016 1421   UROBILINOGEN 0.2 08/27/2013 1130   NITRITE NEGATIVE 01/29/2016 1421   LEUKOCYTESUR LARGE* 01/29/2016 1421   Sepsis Labs: @LABRCNTIP (procalcitonin:4,lacticidven:4) )No results found for this or any previous visit (from the past 240 hour(s)).   Radiological Exams on Admission: Dg Chest 2 View  01/29/2016  CLINICAL DATA:  Slurred speech and right-sided weakness EXAM: CHEST  2 VIEW COMPARISON:  October 25, 2015 FINDINGS: There is no edema or consolidation. The heart size and pulmonary vascularity are normal. No adenopathy. There is degenerative change in the mid thoracic spine. IMPRESSION: No edema or consolidation. Electronically Signed   By: Bretta BangWilliam  Woodruff III M.D.   On: 01/29/2016 12:45   Mr Brain Wo Contrast (neuro Protocol)  01/29/2016  CLINICAL DATA:  70 year old female with slurred speech and right side weakness. Symptoms for 1-2 weeks. Initial encounter. EXAM: MRI HEAD WITHOUT  CONTRAST TECHNIQUE: Multiplanar, multiecho pulse sequences of the brain and surrounding structures were obtained without intravenous contrast. COMPARISON:  Noncontrast head CT 11/01/2013. FINDINGS: No restricted diffusion to suggest acute infarction. No midline shift, mass effect, evidence of mass lesion, extra-axial collection or acute intracranial hemorrhage. Cervicomedullary junction and pituitary are within normal limits. Major intracranial vascular flow voids are preserved. Mild ventriculomegaly involving the lateral and third ventricles appears stable since 2015. No transependymal edema, an the temporal horns are relatively decompressed. The fourth ventricle is stable and has a more normal appearance. Mild periventricular and other mild to moderate scattered bilateral cerebral white matter T2 and FLAIR hyperintensity which is nonspecific. However, there are small chronic lacunar infarcts in both caudate nuclei. No cortical encephalomalacia or chronic cerebral blood products identified. Other deep gray matter nuclei, the brainstem, and cerebellum appear normal for age. Visible internal auditory structures appear normal. Trace mastoid fluid. Negative nasopharynx. Paranasal sinuses are well pneumatized. Negative orbit and scalp soft tissues. IMPRESSION: 1.  No acute intracranial abnormality. 2. Signal changes compatible with mild to moderate for age chronic small vessel disease. 3. Chronic lateral and third ventricular enlargement is stable since 2015 and nonspecific, but favor this is ex vacuo in nature. Electronically Signed   By: Odessa FlemingH  Hall M.D.   On: 01/29/2016 13:18    EKG: Independently reviewed. Sinus rhythm with low voltage. No acute ST elevation or depression  Assessment/Plan: Active Problems:   Weakness   UTI (urinary tract infection)    This patient was discussed with the ED physician, including pertinent vitals, physical exam findings, labs, and imaging.  We also discussed care given by the ED  provider.  #1 weakness  Admit  Likely secondary to UTI  PT consult  Will likely need SNIF placement following hospitalization #2 UTI  Ceftriaxone 1 g every 24 hours IV  Urine culture #3 GERD  Continue home medications #4 hypothyroidism  Continue Synthroid #5 hypertension  Controlled  DVT prophylaxis: lovenox Consultants: None Code Status: Full code Family Communication: husband and daughter in room  Disposition Plan: Will likely need SNF placement following hospitalization   Levie HeritageJacob J Stinson, DO [facility] Pager [phone]  If 7PM-7AM, please contact night-coverage [email or web address] Password [email or web address]

## 2016-01-29 NOTE — ED Notes (Signed)
EDP at bedside  

## 2016-01-30 ENCOUNTER — Observation Stay (HOSPITAL_COMMUNITY): Payer: Medicare HMO

## 2016-01-30 DIAGNOSIS — R531 Weakness: Secondary | ICD-10-CM | POA: Diagnosis not present

## 2016-01-30 DIAGNOSIS — N3000 Acute cystitis without hematuria: Secondary | ICD-10-CM | POA: Diagnosis not present

## 2016-01-30 DIAGNOSIS — E039 Hypothyroidism, unspecified: Secondary | ICD-10-CM | POA: Diagnosis not present

## 2016-01-30 DIAGNOSIS — I10 Essential (primary) hypertension: Secondary | ICD-10-CM

## 2016-01-30 DIAGNOSIS — K219 Gastro-esophageal reflux disease without esophagitis: Secondary | ICD-10-CM | POA: Diagnosis not present

## 2016-01-30 DIAGNOSIS — M25551 Pain in right hip: Secondary | ICD-10-CM | POA: Diagnosis not present

## 2016-01-30 LAB — CBC
HEMATOCRIT: 37.8 % (ref 36.0–46.0)
Hemoglobin: 12.2 g/dL (ref 12.0–15.0)
MCH: 30.6 pg (ref 26.0–34.0)
MCHC: 32.3 g/dL (ref 30.0–36.0)
MCV: 94.7 fL (ref 78.0–100.0)
Platelets: 213 10*3/uL (ref 150–400)
RBC: 3.99 MIL/uL (ref 3.87–5.11)
RDW: 15 % (ref 11.5–15.5)
WBC: 6.9 10*3/uL (ref 4.0–10.5)

## 2016-01-30 MED ORDER — NICOTINE 21 MG/24HR TD PT24
21.0000 mg | MEDICATED_PATCH | Freq: Every day | TRANSDERMAL | Status: DC
  Administered 2016-01-30 – 2016-02-01 (×3): 21 mg via TRANSDERMAL
  Filled 2016-01-30 (×3): qty 1

## 2016-01-30 NOTE — Progress Notes (Signed)
PROGRESS NOTE    Amanda Armstrong  UEA:[id number] DOB: 1946/05/21 DOA: 01/29/2016 PCP: Cassell Smiles., MD  Outpatient Specialists:    Brief Narrative:  38 yof with a hx of hypertension, chronic pain, osteoporosis, and hypothyroidism presented with complaints of weakness bilaterally in her legs. While in the ED, patient was noted to have UTI and was admitted for further management.    Assessment & Plan:   Active Problems:   Weakness   UTI (urinary tract infection)  1. Bilateral lower extremity weakness. Likely secondary to UTI since MRI brain is negative. Patient and family report that weakness is worse on the right side. Will check xray of the right hip. Anticipate improvement with resolution of UTI. Follow up PT evaluation in am for disposition plan.  2. UTI. Continue IV Rocephin. Unfortunately urine cx was not sent on admission. Will send cx at this time although this may be low yield since she is already on abx. Follow up urine culture.  3. GERD. Continue home meds. 4. Hypothyroidism. Continue synthroid.  5. HTN. Stable. Continue outpatient regimen. 6. Tobacco use. Counseled on the importance of cessation. Will provide patch.  DVT prophylaxis: lovenox Code Status: Full Family Communication: Discussed with patient and family present at bedside.. Disposition Plan: discharge once improved.   Consultants:   PT  Procedures:   none  Antimicrobials:   Rocephin 7/14 >>    Subjective: Pain in abd has resolved. Weakness in legs persists. No difficulty moving bowels. Patient reports poor appetite for past 3-4 days.  Objective: Filed Vitals:   01/29/16 1600 01/29/16 1745 01/29/16 2240 01/30/16 0500  BP: 118/66 115/54 116/60 113/58  Pulse: 83 81 85 92  Temp:  98.1 F (36.7 C) 98.3 F (36.8 C) 98.2 F (36.8 C)  TempSrc:  Oral Oral Oral  Resp: Height:   (1.473 m)    Weight:  66.134 kg (145 lb 12.8 oz)    SpO2: 94% 96% 95% 95%    Intake/Output  Summary (Last 24 hours) at 01/30/16 0658 Last data filed at 01/30/16 0500  Gross per 24 hour  Intake    530 ml  Output     30 ml  Net    500 ml   Filed Weights   01/29/16 1137 01/29/16 1745  Weight: 68.04 kg (150 lb) 66.134 kg (145 lb 12.8 oz)    Examination:  General exam: Appears calm and comfortable  Respiratory system: Clear to auscultation. Respiratory effort normal. Cardiovascular system: S1 & S2 heard, RRR. No JVD, murmurs, rubs, gallops or clicks. No pedal edema. Gastrointestinal system: Abdomen is nondistended, soft and nontender. No organomegaly or masses felt. Normal bowel sounds heard. Central nervous system: Alert and oriented. No focal neurological deficits. Extremities: Symmetric 5 x 5 power on left and 4/5 power on right side [chronic]. Skin: No rashes, lesions or ulcers Psychiatry: Judgement and insight appear normal. Mood & affect appropriate.    Data Reviewed: I have personally reviewed following labs and imaging studies  CBC:  Recent Labs Lab 01/29/16 1224 01/30/16 0605  WBC 8.1 6.9  NEUTROABS 5.2  --   HGB 13.1 12.2  HCT 39.2 37.8  MCV 93.3 94.7  PLT 236 213   Basic Metabolic Panel:  Recent Labs Lab 01/29/16 1224  NA 139  K 3.5  CL 106  CO2 27  GLUCOSE 141*  BUN 19  CREATININE 1.26*  CALCIUM 9.1   GFR: Estimated Creatinine Clearance: 32.5 mL/min (by C-G formula based  on Cr of 1.26). Liver Function Tests:  Recent Labs Lab 01/29/16 1224  AST 22  ALT 28  ALKPHOS 80  BILITOT 0.5  PROT 7.0  ALBUMIN 3.6   Coagulation Profile:  Recent Labs Lab 01/29/16 1224  INR 1.00   Cardiac Enzymes:  Recent Labs Lab 01/29/16 1224  TROPONINI <0.03   CBG:  Recent Labs Lab 01/29/16 1133  GLUCAP 149*  Urine analysis:    Component Value Date/Time   COLORURINE YELLOW 01/29/2016 1421   APPEARANCEUR HAZY* 01/29/2016 1421   LABSPEC 1.010 01/29/2016 1421   PHURINE 6.5 01/29/2016 1421   GLUCOSEU NEGATIVE 01/29/2016 1421   HGBUR  LARGE* 01/29/2016 1421   BILIRUBINUR NEGATIVE 01/29/2016 1421   KETONESUR NEGATIVE 01/29/2016 1421   PROTEINUR TRACE* 01/29/2016 1421   UROBILINOGEN 0.2 08/27/2013 1130   NITRITE NEGATIVE 01/29/2016 1421   LEUKOCYTESUR LARGE* 01/29/2016 1421   Sepsis Labs: @LABRCNTIP (procalcitonin:4,lacticidven:4)  )No results found for this or any previous visit (from the past 240 hour(s)).    Radiology Studies: Dg Chest 2 View  01/29/2016  CLINICAL DATA:  Slurred speech and right-sided weakness EXAM: CHEST  2 VIEW COMPARISON:  October 25, 2015 FINDINGS: There is no edema or consolidation. The heart size and pulmonary vascularity are normal. No adenopathy. There is degenerative change in the mid thoracic spine. IMPRESSION: No edema or consolidation. Electronically Signed   By: Bretta BangWilliam  Woodruff III M.D.   On: 01/29/2016 12:45   Mr Brain Wo Contrast (neuro Protocol)  01/29/2016  CLINICAL DATA:  70 year old female with slurred speech and right side weakness. Symptoms for 1-2 weeks. Initial encounter. EXAM: MRI HEAD WITHOUT CONTRAST TECHNIQUE: Multiplanar, multiecho pulse sequences of the brain and surrounding structures were obtained without intravenous contrast. COMPARISON:  Noncontrast head CT 11/01/2013. FINDINGS: No restricted diffusion to suggest acute infarction. No midline shift, mass effect, evidence of mass lesion, extra-axial collection or acute intracranial hemorrhage. Cervicomedullary junction and pituitary are within normal limits. Major intracranial vascular flow voids are preserved. Mild ventriculomegaly involving the lateral and third ventricles appears stable since 2015. No transependymal edema, an the temporal horns are relatively decompressed. The fourth ventricle is stable and has a more normal appearance. Mild periventricular and other mild to moderate scattered bilateral cerebral white matter T2 and FLAIR hyperintensity which is nonspecific. However, there are small chronic lacunar infarcts in  both caudate nuclei. No cortical encephalomalacia or chronic cerebral blood products identified. Other deep gray matter nuclei, the brainstem, and cerebellum appear normal for age. Visible internal auditory structures appear normal. Trace mastoid fluid. Negative nasopharynx. Paranasal sinuses are well pneumatized. Negative orbit and scalp soft tissues. IMPRESSION: 1.  No acute intracranial abnormality. 2. Signal changes compatible with mild to moderate for age chronic small vessel disease. 3. Chronic lateral and third ventricular enlargement is stable since 2015 and nonspecific, but favor this is ex vacuo in nature. Electronically Signed   By: Odessa FlemingH  Hall M.D.   On: 01/29/2016 13:18   Scheduled Meds: . acidophilus  2 capsule Oral TID  . ALPRAZolam  1 mg Oral TID  . aspirin  81 mg Oral Daily  . atorvastatin  20 mg Oral QHS  . cefTRIAXone (ROCEPHIN)  IV  1 g Intravenous Q24H  . enoxaparin (LOVENOX) injection  40 mg Subcutaneous Q24H  . feeding supplement (ENSURE ENLIVE)  237 mL Oral BID BM  . furosemide  20 mg Oral Daily  . levothyroxine  75 mcg Oral QAC breakfast   Continuous Infusions: . 0.9 % NaCl with KCl  20 mEq / L 75 mL/hr at 01/29/16 1902   Time spent: 25 minutes  Erick Blinks, MD [facility] Pager [phone]  If 7PM-7AM, please contact night-coverage [email or web address] Password [email or web address] 01/30/2016, 6:58 AM   By signing my name below, I, Adron Bene, attest that this documentation has been prepared under the direction and in the presence of Erick Blinks, MD. Electronically Signed: Adron Bene 01/30/2016 1:55pm  I, Dr. Erick Blinks, personally performed the services described in this documentaiton. All medical record entries made by the scribe were at my direction and in my presence. I have reviewed the chart and agree that the record reflects my personal performance and is accurate and complete  Erick Blinks, MD, 01/30/2016 2:06 PM

## 2016-01-30 NOTE — Progress Notes (Signed)
Initial Nutrition Assessment  DOCUMENTATION CODES:   Not applicable  INTERVENTION:  Ensure Enlive po BID, each supplement provides 350 kcal and 20 grams of protein   Regular diet   NUTRITION DIAGNOSIS:   Increased nutrient needs related to acute illness as evidenced by estimated needs.   GOAL: Pt to meet >/= 90% of their estimated nutrition needs     MONITOR:   Labs, Weight trends  REASON FOR ASSESSMENT:   Malnutrition Screening Tool    ASSESSMENT: RD working remotely. Chart reviewed.  70 y.o. female with a history of hypertension, chronic pain, hypothyroidism, osteoporosis, history of C. difficile infection. Patient seen for 2 weeks of worsening weakness in her legs bilaterally, especially over the past 2-3 days.   Patients acute illness not impacting her meal intake. Currently 75% of meals consumed. Her weight is down slightly but not clinically significant. She us taking a diuretic medication an dis receiving Ensure. If her current intake trend continues will d/c the ONS. Will follow up as her care progresses.   Recent Labs Lab 01/29/16 1224  NA 139  K 3.5  CL 106  CO2 27  BUN 19  CREATININE 1.26*  CALCIUM 9.1  GLUCOSE 141*  Labs and meds reviewed.  Diet Order:  Diet regular Room service appropriate?: Yes; Fluid consistency:: Thin  Skin:  Reviewed, no issues  Last BM:  7/14  Height:   Ht Readings from Last 1 Encounters:  01/29/16 4\' 10"  (1.473 m)    Weight:   Wt Readings from Last 1 Encounters:  01/29/16 145 lb 12.8 oz (66.134 kg)    Ideal Body Weight:  43 kg  BMI:  Body mass index is 30.48 kg/(m^2).  Estimated Nutritional Needs:   Kcal:  1650-1782 kcal  Protein:  75-80 gr  Fluid:  1.7-1.8 liters daily  EDUCATION NEEDS:   No education needs identified at this time  Royann ShiversLynn Kweku Stankey MS,RD,CSG,LDN Office: #[phone] Pager: [phone]

## 2016-01-30 NOTE — Care Management Obs Status (Signed)
MEDICARE OBSERVATION STATUS NOTIFICATION   Patient Details  Name: Amanda SailorsRebekah P Armstrong MRN: [id number] Date of Birth: [birth year]   Medicare Observation Status Notification Given:  Yes    Fuller PlanWelborn, Shermar Friedland M, RN 01/30/2016, 11:59 AM

## 2016-01-31 DIAGNOSIS — K219 Gastro-esophageal reflux disease without esophagitis: Secondary | ICD-10-CM | POA: Diagnosis not present

## 2016-01-31 DIAGNOSIS — N3000 Acute cystitis without hematuria: Secondary | ICD-10-CM | POA: Diagnosis not present

## 2016-01-31 DIAGNOSIS — I10 Essential (primary) hypertension: Secondary | ICD-10-CM | POA: Diagnosis not present

## 2016-01-31 DIAGNOSIS — R531 Weakness: Secondary | ICD-10-CM | POA: Diagnosis not present

## 2016-01-31 DIAGNOSIS — E039 Hypothyroidism, unspecified: Secondary | ICD-10-CM | POA: Diagnosis not present

## 2016-01-31 LAB — BASIC METABOLIC PANEL
Anion gap: 8 (ref 5–15)
BUN: 12 mg/dL (ref 6–20)
CHLORIDE: 109 mmol/L (ref 101–111)
CO2: 24 mmol/L (ref 22–32)
CREATININE: 1.13 mg/dL — AB (ref 0.44–1.00)
Calcium: 8.6 mg/dL — ABNORMAL LOW (ref 8.9–10.3)
GFR calc Af Amer: 55 mL/min — ABNORMAL LOW (ref >60)
GFR calc non Af Amer: 47 mL/min — ABNORMAL LOW (ref >60)
GLUCOSE: 99 mg/dL (ref 65–99)
Potassium: 4.1 mmol/L (ref 3.5–5.1)
Sodium: 141 mmol/L (ref 135–145)

## 2016-01-31 NOTE — Progress Notes (Signed)
PROGRESS NOTE    Amanda Armstrong  WJX:[id number] DOB: 09/13/1945 DOA: 01/29/2016 PCP: Amanda SmilesFUSCO,[name] J., MD  Outpatient Specialists:    Brief Narrative:  [age] yof with a hx of hypertension, chronic pain, osteoporosis, and hypothyroidism presented with complaints of weakness bilaterally in her legs. While in the ED, patient was noted to have UTI. She was started on antibiotics and was admitted for further management. DG hip negative for any fracture. PT will evaluate the patient later today for disposition plan.   Assessment & Plan:   Active Problems:   Weakness   UTI (urinary tract infection)  1. Bilateral lower extremity weakness. Likely secondary to UTI since MRI brain is negative. Anticipate improvement with resolution of UTI. Patient and family report that weakness is worse on the right side [chronic]. Xray of right hip is negative for fracture. Follow up PT evaluation for disposition plan.  2. UTI. Continue IV Rocephin. Unfortunately urine cx was not sent on admission. Culture has been ordered, though this may be low yield since she is already on abx. Follow up urine culture.  3. GERD. Continue home meds. 4. Hypothyroidism. Continue synthroid.  5. HTN. Stable. Continue outpatient regimen. 6. Tobacco use. Counseled on the importance of cessation. Will provide patch.   DVT prophylaxis: lovenox Code Status: Full Family Communication: Discussed with patient and family present at bedside. Disposition Plan: discharge once improved.    Consultants:   PT  Procedures:   none  Antimicrobials:   Rocephin 7/14 >>    Subjective: Pt is doing okay today. She did not sleep well last night. Pt reports pain in her legs. She is breathing well.  Objective: Filed Vitals:   01/30/16 0500 01/30/16 1453 01/30/16 1900 01/31/16 0705  BP: 113/58 120/54 110/62 124/72  Pulse: 92 93 105 98  Temp: 98.2 F (36.8 C) 98.7 F (37.1 C) 98.7 F (37.1 C) 98.9 F (37.2 C)  TempSrc: Oral  Oral  Oral  Resp: 20 20 20 20   Height:      Weight:      SpO2: 95% 97% 96% 96%    Intake/Output Summary (Last 24 hours) at 01/31/16 0711 Last data filed at 01/30/16 1900  Gross per 24 hour  Intake    600 ml  Output      0 ml  Net    600 ml   Filed Weights   01/29/16 1137 01/29/16 1745  Weight: 68.04 kg (150 lb) 66.134 kg (145 lb 12.8 oz)   Examination:  General exam: Appears calm and comfortable  Respiratory system: Poor inspiratory effort. Diminished breath sounds at bases. Cardiovascular system: S1 & S2 heard, tachycardic. No JVD, murmurs, rubs, gallops or clicks. No pedal edema. Gastrointestinal system: Abdomen is nondistended, soft and nontender. No organomegaly or masses felt. Normal bowel sounds heard. Central nervous system: Alert and oriented. No focal neurological deficits. Extremities: Symmetric 5 x 5 power. Skin: No rashes, lesions or ulcers Psychiatry: Flat affect. Cooperative with exam.  Data Reviewed: I have personally reviewed following labs and imaging studies  CBC:  Recent Labs Lab 01/29/16 1224 01/30/16 0605  WBC 8.1 6.9  NEUTROABS 5.2  --   HGB 13.1 12.2  HCT 39.2 37.8  MCV 93.3 94.7  PLT 236 213   Basic Metabolic Panel:  Recent Labs Lab 01/29/16 1224  NA 139  K 3.5  CL 106  CO2 27  GLUCOSE 141*  BUN 19  CREATININE 1.26*  CALCIUM 9.1   GFR: Estimated Creatinine Clearance: 32.5 mL/min (by  C-G formula based on Cr of 1.26). Liver Function Tests:  Recent Labs Lab 01/29/16 1224  AST 22  ALT 28  ALKPHOS 80  BILITOT 0.5  PROT 7.0  ALBUMIN 3.6   Coagulation Profile:  Recent Labs Lab 01/29/16 1224  INR 1.00   Cardiac Enzymes:  Recent Labs Lab 01/29/16 1224  TROPONINI <0.03   CBG:  Recent Labs Lab 01/29/16 1133  GLUCAP 149*  Urine analysis:    Component Value Date/Time   COLORURINE YELLOW 01/29/2016 1421   APPEARANCEUR HAZY* 01/29/2016 1421   LABSPEC 1.010 01/29/2016 1421   PHURINE 6.5 01/29/2016 1421   GLUCOSEU  NEGATIVE 01/29/2016 1421   HGBUR LARGE* 01/29/2016 1421   BILIRUBINUR NEGATIVE 01/29/2016 1421   KETONESUR NEGATIVE 01/29/2016 1421   PROTEINUR TRACE* 01/29/2016 1421   UROBILINOGEN 0.2 08/27/2013 1130   NITRITE NEGATIVE 01/29/2016 1421   LEUKOCYTESUR LARGE* 01/29/2016 1421     Radiology Studies: Dg Chest 2 View  01/29/2016  CLINICAL DATA:  Slurred speech and right-sided weakness EXAM: CHEST  2 VIEW COMPARISON:  October 25, 2015 FINDINGS: There is no edema or consolidation. The heart size and pulmonary vascularity are normal. No adenopathy. There is degenerative change in the mid thoracic spine. IMPRESSION: No edema or consolidation. Electronically Signed   By: Bretta Bang III M.D.   On: 01/29/2016 12:45   Mr Brain Wo Contrast (neuro Protocol)  01/29/2016  CLINICAL DATA:  70 year old female with slurred speech and right side weakness. Symptoms for 1-2 weeks. Initial encounter. EXAM: MRI HEAD WITHOUT CONTRAST TECHNIQUE: Multiplanar, multiecho pulse sequences of the brain and surrounding structures were obtained without intravenous contrast. COMPARISON:  Noncontrast head CT 11/01/2013. FINDINGS: No restricted diffusion to suggest acute infarction. No midline shift, mass effect, evidence of mass lesion, extra-axial collection or acute intracranial hemorrhage. Cervicomedullary junction and pituitary are within normal limits. Major intracranial vascular flow voids are preserved. Mild ventriculomegaly involving the lateral and third ventricles appears stable since 2015. No transependymal edema, an the temporal horns are relatively decompressed. The fourth ventricle is stable and has a more normal appearance. Mild periventricular and other mild to moderate scattered bilateral cerebral white matter T2 and FLAIR hyperintensity which is nonspecific. However, there are small chronic lacunar infarcts in both caudate nuclei. No cortical encephalomalacia or chronic cerebral blood products identified. Other deep  gray matter nuclei, the brainstem, and cerebellum appear normal for age. Visible internal auditory structures appear normal. Trace mastoid fluid. Negative nasopharynx. Paranasal sinuses are well pneumatized. Negative orbit and scalp soft tissues. IMPRESSION: 1.  No acute intracranial abnormality. 2. Signal changes compatible with mild to moderate for age chronic small vessel disease. 3. Chronic lateral and third ventricular enlargement is stable since 2015 and nonspecific, but favor this is ex vacuo in nature. Electronically Signed   By: Odessa Fleming M.D.   On: 01/29/2016 13:18   Dg Hip Unilat With Pelvis 2-3 Views Right  01/30/2016  CLINICAL DATA:  Right hip pain without injury. EXAM: DG HIP (WITH OR WITHOUT PELVIS) 2-3V RIGHT COMPARISON:  None. FINDINGS: Bones are demineralized. SI joints and symphysis pubis unremarkable. No evidence for pubic ramus fracture. AP and frog-leg lateral views of the right hip show no femoral neck fracture. IMPRESSION: Negative. Electronically Signed   By: Kennith Center M.D.   On: 01/30/2016 15:44   Scheduled Meds: . acidophilus  2 capsule Oral TID  . ALPRAZolam  1 mg Oral TID  . aspirin  81 mg Oral Daily  . atorvastatin  20  mg Oral QHS  . cefTRIAXone (ROCEPHIN)  IV  1 g Intravenous Q24H  . enoxaparin (LOVENOX) injection  40 mg Subcutaneous Q24H  . feeding supplement (ENSURE ENLIVE)  237 mL Oral BID BM  . furosemide  20 mg Oral Daily  . levothyroxine  75 mcg Oral QAC breakfast  . nicotine  21 mg Transdermal Daily   Continuous Infusions: . 0.9 % NaCl with KCl 20 mEq / L 75 mL/hr at 01/30/16 2031   Time spent: 25 minutes  Erick Blinks, MD [facility] Pager [phone]  If 7PM-7AM, please contact night-coverage [email or web address] Password [email or web address] 01/31/2016, 7:11 AM   By signing my name below, I, Adron Bene, attest that this documentation has been prepared under the direction and in the presence of Erick Blinks, MD. Electronically Signed: Adron Bene 01/31/2016 11:52am  I, Dr. Erick Blinks, personally performed the services described in this documentaiton. All medical record entries made by the scribe were at my direction and in my presence. I have reviewed the chart and agree that the record reflects my personal performance and is accurate and complete  Erick Blinks, MD, 01/31/2016 12:08 PM

## 2016-01-31 NOTE — Evaluation (Signed)
Physical Therapy Evaluation Patient Details Name: Amanda Armstrong MRN: [id number] DOB: 07-29-1945 Today's Date: 01/31/2016   History of Present Illness  Dorri Ozturk is a 70yo white female who comes to [facility] after 2-3 days of functional decline and difficulty ambulating. The patient is admitted UTI. 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 Pt endorses 4 falls in past 6 months, and at baseline has difficulty tolerating HH distances with rollator, and needs assistance with bed mobility.   Clinical Impression  Upon entry, the patient is received semirecumbent in bed, husband, daughter, grandson present. The pt is awake and agreeable to participate. No acute distress noted at this time, although the pt appears distressed. The pt is alert and oriented x3, pleasant, conversational, and following simple commands consistently. Functional mobility assessment demonstrates severe weakness, the pt now unable to move RLE against resistance of bed friction, and unable to provide much force with BUE for positioning. The pt demonstrates weakness in the trunk and neck as well, and when standing is unable to take steps without maxA to move feet and provide help with weight shifting. The pt reports she is 25% weaker than her baseline, but I suspect this may be underestimated. The pt requires total physical assistance for all mobility, whereas at baseline she performs household distances with difficulty and modified independence. The patient is at high risk for falls as evidence by poor trunk control and history of falls in past 6 months.The patient will need skilled placement for recovery of baseline strength and independence, but if her baseline level is as low as she describes, she will benefit from additional adaptive equipment at home; however, these can be determined at the next venue of care.    Patient presenting with impairment of strength, balance, and activity tolerance, limiting ability to perform  ADL and mobility tasks at  baseline level of function. Patient will benefit from skilled intervention to address the above impairments and limitations, in order to restore to prior level of function, improve patient safety upon discharge, and to decrease falls risk.       Follow Up Recommendations SNF    Equipment Recommendations  None recommended by PT    Recommendations for Other Services       Precautions / Restrictions Precautions Precautions: None Precaution Comments: High Falls risk per PT.  Restrictions Weight Bearing Restrictions: No      Mobility  Bed Mobility Overal bed mobility: +2 for physical assistance;Needs Assistance Bed Mobility: Supine to Sit     Supine to sit: +2 for physical assistance;Total assist     General bed mobility comments: Unable to contribute with BUE significantly  Transfers Overall transfer level: Needs assistance Equipment used: 1 person hand held assist Transfers: Sit to/from [employer] Sit to Stand: Mod assist Stand pivot transfers: Total assist;+2 physical assistance          Ambulation/Gait Ambulation/Gait assistance:  (unable at this time. )              Stairs            Wheelchair Mobility    Modified Rankin (Stroke Patients Only)       Balance Overall balance assessment: History of Falls;Needs assistance         Standing balance support: During functional activity Standing balance-Leahy Scale: Zero  Pertinent Vitals/Pain Pain Assessment: No/denies pain    Home Living Family/patient expects to be discharged to:: Private residence Living Arrangements: Spouse/significant other Available Help at Discharge: Family Type of Home: House Home Access: Stairs to enter Entrance Stairs-Rails: None Entrance Stairs-Number of Steps: 4 Home Layout: One level Home Equipment: Environmental consultantWalker - 4 wheels;Shower seat;Wheelchair - manual      Prior Function            Comments: HH AMB c rolator; difficulty with bed mobility; additional time/effort to perform bathing/dressing.      Hand Dominance        Extremity/Trunk Assessment   Upper Extremity Assessment: Generalized weakness           Lower Extremity Assessment: Generalized weakness         Communication   Communication:  (hypophonic, hypoactive)  Cognition Arousal/Alertness: Lethargic Behavior During Therapy: Flat affect Overall Cognitive Status: Difficult to assess                      General Comments      Exercises        Assessment/Plan    PT Assessment Patient needs continued PT services  PT Diagnosis Difficulty walking;Generalized weakness;Altered mental status   PT Problem List Decreased strength;Decreased balance;Decreased mobility  PT Treatment Interventions Gait training;Patient/family education;Functional mobility training;Stair training;Therapeutic activities;Therapeutic exercise;Balance training   PT Goals (Current goals can be found in the Care Plan section) Acute Rehab PT Goals Patient Stated Goal: regain strength and independence Time For Goal Achievement: 02/14/16 Potential to Achieve Goals: Fair    Frequency Min 4X/week   Barriers to discharge Inaccessible home environment;Decreased caregiver support      Co-evaluation               End of Session   Activity Tolerance: Patient tolerated treatment well;No increased pain;Patient limited by fatigue;Patient limited by lethargy Patient left: in chair;with call bell/phone within reach;with chair alarm set;with family/visitor present Nurse Communication: Mobility status;Other (comment)    Functional Assessment Tool Used: Clinical Judgment Functional Limitation: Changing and maintaining body position Changing and Maintaining Body Position Current Status (Z6109(G8981): At least 80 percent but less than 100 percent impaired, limited or restricted Changing and Maintaining Body  Position Goal Status (U0454(G8982): At least 80 percent but less than 100 percent impaired, limited or restricted    Time: 1233-1259 PT Time Calculation (min) (ACUTE ONLY): 26 min   Charges:   PT Evaluation $PT Eval Moderate Complexity: 1 Procedure PT Treatments $Therapeutic Activity: 8-22 mins   PT G Codes:   PT G-Codes **NOT FOR INPATIENT CLASS** Functional Assessment Tool Used: Clinical Judgment Functional Limitation: Changing and maintaining body position Changing and Maintaining Body Position Current Status (U9811(G8981): At least 80 percent but less than 100 percent impaired, limited or restricted Changing and Maintaining Body Position Goal Status (B1478(G8982): At least 80 percent but less than 100 percent impaired, limited or restricted    2:43 PM, 01/31/2016 Rosamaria LintsAllan C Buccola, PT, DPT Physical Therapist - [place] [phone]([phone])  [phone] (mobile)

## 2016-02-01 DIAGNOSIS — R69 Illness, unspecified: Secondary | ICD-10-CM | POA: Diagnosis not present

## 2016-02-01 DIAGNOSIS — N3000 Acute cystitis without hematuria: Secondary | ICD-10-CM | POA: Diagnosis not present

## 2016-02-01 DIAGNOSIS — M6281 Muscle weakness (generalized): Secondary | ICD-10-CM | POA: Diagnosis not present

## 2016-02-01 DIAGNOSIS — E559 Vitamin D deficiency, unspecified: Secondary | ICD-10-CM | POA: Diagnosis not present

## 2016-02-01 DIAGNOSIS — K219 Gastro-esophageal reflux disease without esophagitis: Secondary | ICD-10-CM

## 2016-02-01 DIAGNOSIS — S37009A Unspecified injury of unspecified kidney, initial encounter: Secondary | ICD-10-CM | POA: Diagnosis not present

## 2016-02-01 DIAGNOSIS — N390 Urinary tract infection, site not specified: Secondary | ICD-10-CM | POA: Diagnosis not present

## 2016-02-01 DIAGNOSIS — R278 Other lack of coordination: Secondary | ICD-10-CM | POA: Diagnosis not present

## 2016-02-01 DIAGNOSIS — R531 Weakness: Secondary | ICD-10-CM | POA: Diagnosis not present

## 2016-02-01 DIAGNOSIS — N179 Acute kidney failure, unspecified: Secondary | ICD-10-CM

## 2016-02-01 DIAGNOSIS — K229 Disease of esophagus, unspecified: Secondary | ICD-10-CM | POA: Diagnosis not present

## 2016-02-01 DIAGNOSIS — M129 Arthropathy, unspecified: Secondary | ICD-10-CM | POA: Diagnosis not present

## 2016-02-01 DIAGNOSIS — I10 Essential (primary) hypertension: Secondary | ICD-10-CM | POA: Diagnosis not present

## 2016-02-01 DIAGNOSIS — E039 Hypothyroidism, unspecified: Secondary | ICD-10-CM | POA: Diagnosis not present

## 2016-02-01 DIAGNOSIS — R41841 Cognitive communication deficit: Secondary | ICD-10-CM | POA: Diagnosis not present

## 2016-02-01 DIAGNOSIS — Z720 Tobacco use: Secondary | ICD-10-CM | POA: Diagnosis not present

## 2016-02-01 LAB — BASIC METABOLIC PANEL
Anion gap: 8 (ref 5–15)
BUN: 16 mg/dL (ref 6–20)
CALCIUM: 9.1 mg/dL (ref 8.9–10.3)
CO2: 26 mmol/L (ref 22–32)
CREATININE: 1.4 mg/dL — AB (ref 0.44–1.00)
Chloride: 106 mmol/L (ref 101–111)
GFR, EST AFRICAN AMERICAN: 42 mL/min — AB (ref >60)
GFR, EST NON AFRICAN AMERICAN: 37 mL/min — AB (ref >60)
Glucose, Bld: 100 mg/dL — ABNORMAL HIGH (ref 65–99)
Potassium: 4.2 mmol/L (ref 3.5–5.1)
SODIUM: 140 mmol/L (ref 135–145)

## 2016-02-01 MED ORDER — CIPROFLOXACIN HCL 500 MG PO TABS: 500.0000 mg | ORAL_TABLET | Freq: Two times a day (BID) | ORAL | Status: DC

## 2016-02-01 MED ORDER — FUROSEMIDE 20 MG PO TABS: 20.0000 mg | ORAL_TABLET | Freq: Every day | ORAL | Status: DC

## 2016-02-01 MED ORDER — ALPRAZOLAM 1 MG PO TABS: 1.0000 mg | ORAL_TABLET | Freq: Three times a day (TID) | ORAL | Status: DC

## 2016-02-01 NOTE — Clinical Social Work Placement (Signed)
   CLINICAL SOCIAL WORK PLACEMENT  NOTE  Date:  02/01/2016  Patient Details  Name: Amanda Armstrong MRN: [id number] Date of Birth: Oct 03, 1945  Clinical Social Work is seeking post-discharge placement for this patient at the Skilled  Nursing Facility level of care (*CSW will initial, date and re-position this form in  chart as items are completed):  Yes   Patient/family provided with [place] Clinical Social Work Department's list of facilities offering this level of care within the geographic area requested by the patient (or if unable, by the patient's family).  Yes   Patient/family informed of their freedom to choose among providers that offer the needed level of care, that participate in Medicare, Medicaid or managed care program needed by the patient, have an available bed and are willing to accept the patient.  Yes   Patient/family informed of [place]'s ownership interest in [facility] and [facility], as well as of the fact that they are under no obligation to receive care at these facilities.  PASRR submitted to EDS on       PASRR number received on       Existing PASRR number confirmed on 02/01/16     FL2 transmitted to all facilities in geographic area requested by pt/family on 02/01/16     FL2 transmitted to all facilities within larger geographic area on       Patient informed that his/her managed care company has contracts with or will negotiate with certain facilities, including the following:            Patient/family informed of bed offers received.  Patient chooses bed at       Physician recommends and patient chooses bed at      Patient to be transferred to   on  .  Patient to be transferred to facility by       Patient family notified on   of transfer.  Name of family member notified:        PHYSICIAN       Additional Comment:    _______________________________________________ Annice NeedySettle, Neera Teng D, LCSW 02/01/2016, 12:17 PM

## 2016-02-01 NOTE — NC FL2 (Signed)
[place] MEDICAID FL2 LEVEL OF CARE SCREENING TOOL     IDENTIFICATION  Patient Name: Trey SailorsRebekah P Kann Birthdate: 1946/03/01 Sex: female Admission Date (Current Location): 01/29/2016  [facility] [place]   Facility and Address:  [facility],  [address], [place] [id number]      Provider Number: [id number]  Attending Physician Name and Address:  Erick BlinksJehanzeb Memon, MD  Relative Name and Phone Number:       Current Level of Care: Hospital Recommended Level of Care: Skilled Nursing Facility Prior Approval Number:    Date Approved/Denied:   PASRR Number:  [id number])  Discharge Plan: SNF    Current Diagnoses: Patient Active Problem List   Diagnosis Date Noted  . UTI (urinary tract infection) 01/29/2016  . Nicotine dependence 09/07/2015  . Enteritis due to Clostridium difficile 08/29/2013  . Weakness 08/02/2013  . Diarrhea 08/02/2013  . Acute pyelonephritis 05/28/2013  . UTI (lower urinary tract infection) 05/27/2013  . Tachypnea 05/27/2013  . Hyperglycemia 05/27/2013  . Arthritis   . Anxiety   . Hypothyroidism     Orientation RESPIRATION BLADDER Height & Weight     Self, Time, Situation, Place  Normal Continent Weight: 145 lb 12.8 oz (66.134 kg) Height:  4\' 10"  (147.3 cm)  BEHAVIORAL SYMPTOMS/MOOD NEUROLOGICAL BOWEL NUTRITION STATUS      Continent Diet (Regular)  AMBULATORY STATUS COMMUNICATION OF NEEDS Skin   Extensive Assist Verbally Normal                       Personal Care Assistance Level of Assistance  Bathing, Dressing Bathing Assistance: Maximum assistance   Dressing Assistance: Maximum assistance     Functional Limitations Info  Sight, Hearing, Speech Sight Info: Adequate Hearing Info: Adequate Speech Info: Adequate    SPECIAL CARE FACTORS FREQUENCY  PT (By licensed PT)     PT Frequency: 5x/week              Contractures      Additional Factors Info  Code Status, Allergies Code Status  Info:  (Full Code  ) Allergies Info: Iron, Oxycodone           Current Medications (02/01/2016):  This is the current hospital active medication list Current Facility-Administered Medications  Medication Dose Route Frequency Provider Last Rate Last Dose  . acetaminophen (TYLENOL) tablet 650 mg  650 mg Oral Q6H PRN Levie HeritageJacob J Stinson, DO       Or  . acetaminophen (TYLENOL) suppository 650 mg  650 mg Rectal Q6H PRN Rhona RaiderJacob J Stinson, DO      . acidophilus (RISAQUAD) capsule 2 capsule  2 capsule Oral TID Levie HeritageJacob J Stinson, DO   2 capsule at 02/01/16 1016  . ALPRAZolam Prudy Feeler(XANAX) tablet 1 mg  1 mg Oral TID Rhona RaiderJacob J Stinson, DO   1 mg at 02/01/16 1017  . aspirin chewable tablet 81 mg  81 mg Oral Daily Rhona RaiderJacob J Stinson, DO   81 mg at 02/01/16 1017  . atorvastatin (LIPITOR) tablet 20 mg  20 mg Oral QHS Rhona RaiderJacob J Stinson, DO   20 mg at 01/31/16 2143  . cefTRIAXone (ROCEPHIN) 1 g in dextrose 5 % 50 mL IVPB  1 g Intravenous Q24H Rhona RaiderJacob J Stinson, DO   1 g at 01/31/16 1544  . enoxaparin (LOVENOX) injection 40 mg  40 mg Subcutaneous Q24H Rhona RaiderJacob J Stinson, DO   40 mg at 01/31/16 2142  . feeding supplement (ENSURE ENLIVE) (ENSURE ENLIVE) liquid  237 mL  237 mL Oral BID BM Rhona Raider Stinson, DO   237 mL at 02/01/16 1000  . furosemide (LASIX) tablet 20 mg  20 mg Oral Daily Rhona Raider Stinson, DO   20 mg at 02/01/16 1017  . levothyroxine (SYNTHROID, LEVOTHROID) tablet 75 mcg  75 mcg Oral QAC breakfast Rhona Raider Stinson, DO   75 mcg at 02/01/16 1017  . nicotine (NICODERM CQ - dosed in mg/24 hours) patch 21 mg  21 mg Transdermal Daily Erick Blinks, MD   21 mg at 02/01/16 1016  . ondansetron (ZOFRAN) tablet 4 mg  4 mg Oral Q6H PRN Rhona Raider Stinson, DO       Or  . ondansetron [facility]) injection 4 mg  4 mg Intravenous Q6H PRN Rhona Raider Stinson, DO      . polyethylene glycol (MIRALAX / GLYCOLAX) packet 17 g  17 g Oral Daily PRN Rhona Raider Stinson, DO   17 g at 02/01/16 1016     Discharge Medications: Please see discharge summary for a  list of discharge medications.  Relevant Imaging Results:  Relevant Lab Results:   Additional Information Patient's SSN is [id number], DOB [birth year]  Annice Needy, LCSW

## 2016-02-01 NOTE — Clinical Social Work Note (Signed)
CSW notified facility that patient was discharging.  Patient's nurse advised patient's daughter that she was discharging at discharge and would be going to [facility]. Family advised to RN that they desired to take patient in private vehicle.   CSW signing off.     Rober Skeels, Juleen ChinaHeather D, LCSW

## 2016-02-01 NOTE — Clinical Social Work Placement (Signed)
   CLINICAL SOCIAL WORK PLACEMENT  NOTE  Date:  02/01/2016  Patient Details  Name: Amanda SailorsRebekah P [place]ford MRN: [id number] Date of Birth: 05-15-1946  Clinical Social Work is seeking post-discharge placement for this patient at the Skilled  Nursing Facility level of care (*CSW will initial, date and re-position this form in  chart as items are completed):  Yes   Patient/family provided with [place] Clinical Social Work Department's list of facilities offering this level of care within the geographic area requested by the patient (or if unable, by the patient's family).  Yes   Patient/family informed of their freedom to choose among providers that offer the needed level of care, that participate in Medicare, Medicaid or managed care program needed by the patient, have an available bed and are willing to accept the patient.  Yes   Patient/family informed of [place]'s ownership interest in [facility] and [facility], as well as of the fact that they are under no obligation to receive care at these facilities.  PASRR submitted to EDS on       PASRR number received on       Existing PASRR number confirmed on 02/01/16     FL2 transmitted to all facilities in geographic area requested by pt/family on 02/01/16     FL2 transmitted to all facilities within larger geographic area on       Patient informed that his/her managed care company has contracts with or will negotiate with certain facilities, including the following:        Yes   Patient/family informed of bed offers received.  Patient chooses bed at       Physician recommends and patient chooses bed at      Patient to be transferred to   on 02/01/16.  Patient to be transferred to facility by [facility]     Patient family notified on 02/01/16 of transfer.  Name of family member notified:  daughter, by RN     PHYSICIAN       Additional Comment:    _______________________________________________ Annice NeedySettle, Skyy Mcknight D,  LCSW 02/01/2016, 4:40 PM

## 2016-02-01 NOTE — Discharge Summary (Signed)
Physician Discharge Summary  Amanda Armstrong:[id number] DOB: 1946/07/11 DOA: 01/29/2016  PCP: Cassell Smiles., MD  Admit date: 01/29/2016 Discharge date: 02/01/2016  Admitted From: home Disposition:  Discharge to SNF  Recommendations for Outpatient Follow-up:  1. Follow up with PCP in 1-2 weeks 2. Please obtain BMP/CBC in one week 3. Discharge to SNF  Home Health: no Equipment/Devices: none Discharge Condition: improved CODE STATUS: full Diet recommendation: heart healthy  HPI: 70 yof with a hx of hypertension, chronic pain, osteoporosis, and hypothyroidism presented with complaints of 2-3 days of bilateral weakness in her legs. She could normally ambulated with a walker, but was unable to move about without plenty of assistance in the days prior to admission. She reported no alleviating or aggravating factors. She also complained of moderate, non-radiating lower abdominal pain, with associated lack of appetite. She denied any fevers, chills, nausea, or vomiting.   Discharge Diagnoses:  Active Problems:   Weakness   UTI (urinary tract infection)  1. Bilateral lower extremity weakness. Likely secondary to UTI. MRI brain is negative for acute findings. Patient and family report that weakness is worse on the right side [chronic]. Xray of right hip is negative for fracture. PT evaluated and recommends SNF on discharge.  2. UTI. Treated with Rocephin. Despite being ordered, unfortunately urine cx was not sent on admission. At this point, it would likely be low yield since she has already had several days of abx. Will have to empirically treat her. Will discharge on a course of cipro. Clinically she is improved.  3. GERD. Continue home meds. 4. Hypothyroidism. Continue synthroid.  5. HTN. Stable. Continue outpatient regimen. 6. Tobacco use. Counseled on the importance of cessation. Provided nicotine patch. 7. AKI. Mild elevation of creatinine on discharge. Will hold lasix for the  next 3 days. Discontinue NSAIDs. Repeat BMET in 1 week  Discharge Instructions      Discharge Instructions    Diet - low sodium heart healthy    Complete by:  As directed      Increase activity slowly    Complete by:  As directed             Medication List    STOP taking these medications        ALEVE 220 MG tablet  Generic drug:  naproxen sodium      TAKE these medications        acidophilus Caps capsule  Take 2 capsules by mouth 3 (three) times daily.     alendronate 70 MG tablet  Commonly known as:  FOSAMAX  Take 70 mg by mouth every 7 (seven) days.     ALPRAZolam 1 MG tablet  Commonly known as:  XANAX  Take 1 tablet (1 mg total) by mouth 3 (three) times daily.     aspirin 81 MG tablet  Take 81 mg by mouth daily.     atorvastatin 20 MG tablet  Commonly known as:  LIPITOR  Take 20 mg by mouth at bedtime.     calcium-vitamin D 500-200 MG-UNIT tablet  Commonly known as:  OSCAL WITH D  Take 1 tablet by mouth daily with breakfast.     ciprofloxacin 500 MG tablet  Commonly known as:  CIPRO  Take 1 tablet (500 mg total) by mouth 2 (two) times daily.     furosemide 20 MG tablet  Commonly known as:  LASIX  Take 1 tablet (20 mg total) by mouth daily. Resume in 3 days  levothyroxine 75 MCG tablet  Commonly known as:  SYNTHROID, LEVOTHROID  Take 75 mcg by mouth daily.     PROBIOTIC PO  Take 1 capsule by mouth daily.     vitamin B-12 500 MCG tablet  Commonly known as:  CYANOCOBALAMIN  Take 500 mcg by mouth daily.        Allergies  Allergen Reactions  . Iron Other (See Comments)    "upset stomach"  . Oxycodone Other (See Comments)    "CX HER TO FELL LIKE SHE IS OUT OF HER BODY"    Consultations:  PT   Procedures/Studies: Dg Chest 2 View  01/29/2016  CLINICAL DATA:  Slurred speech and right-sided weakness EXAM: CHEST  2 VIEW COMPARISON:  October 25, 2015 FINDINGS: There is no edema or consolidation. The heart size and pulmonary vascularity are  normal. No adenopathy. There is degenerative change in the mid thoracic spine. IMPRESSION: No edema or consolidation. Electronically Signed   By: Bretta BangWilliam  Woodruff III M.D.   On: 01/29/2016 12:45   Mr Brain Wo Contrast (neuro Protocol)  01/29/2016  CLINICAL DATA:  70 year old female with slurred speech and right side weakness. Symptoms for 1-2 weeks. Initial encounter. EXAM: MRI HEAD WITHOUT CONTRAST TECHNIQUE: Multiplanar, multiecho pulse sequences of the brain and surrounding structures were obtained without intravenous contrast. COMPARISON:  Noncontrast head CT 11/01/2013. FINDINGS: No restricted diffusion to suggest acute infarction. No midline shift, mass effect, evidence of mass lesion, extra-axial collection or acute intracranial hemorrhage. Cervicomedullary junction and pituitary are within normal limits. Major intracranial vascular flow voids are preserved. Mild ventriculomegaly involving the lateral and third ventricles appears stable since 2015. No transependymal edema, an the temporal horns are relatively decompressed. The fourth ventricle is stable and has a more normal appearance. Mild periventricular and other mild to moderate scattered bilateral cerebral white matter T2 and FLAIR hyperintensity which is nonspecific. However, there are small chronic lacunar infarcts in both caudate nuclei. No cortical encephalomalacia or chronic cerebral blood products identified. Other deep gray matter nuclei, the brainstem, and cerebellum appear normal for age. Visible internal auditory structures appear normal. Trace mastoid fluid. Negative nasopharynx. Paranasal sinuses are well pneumatized. Negative orbit and scalp soft tissues. IMPRESSION: 1.  No acute intracranial abnormality. 2. Signal changes compatible with mild to moderate for age chronic small vessel disease. 3. Chronic lateral and third ventricular enlargement is stable since 2015 and nonspecific, but favor this is ex vacuo in nature. Electronically  Signed   By: Odessa FlemingH  Hall M.D.   On: 01/29/2016 13:18   Dg Hip Unilat With Pelvis 2-3 Views Right  01/30/2016  CLINICAL DATA:  Right hip pain without injury. EXAM: DG HIP (WITH OR WITHOUT PELVIS) 2-3V RIGHT COMPARISON:  None. FINDINGS: Bones are demineralized. SI joints and symphysis pubis unremarkable. No evidence for pubic ramus fracture. AP and frog-leg lateral views of the right hip show no femoral neck fracture. IMPRESSION: Negative. Electronically Signed   By: Kennith CenterEric  Mansell M.D.   On: 01/30/2016 15:44   none   Subjective: Feeling better today. Abdominal pain resolved. No cough or shortness of breath  Discharge Exam: Filed Vitals:   02/01/16 0635 02/01/16 1416  BP: 107/53 122/63  Pulse: 95 99  Temp: 98.5 F (36.9 C) 98.8 F (37.1 C)  Resp: 20 24   Filed Vitals:   01/31/16 0705 01/31/16 2145 02/01/16 0635 02/01/16 1416  BP: 124/72 108/51 107/53 122/63  Pulse: 98 115 95 99  Temp: 98.9 F (37.2 C) 100.4 F (38 C)  98.5 F (36.9 C) 98.8 F (37.1 C)  TempSrc: Oral Oral Oral Oral  Resp: 20 20 20 24   Height:      Weight:      SpO2: 96% 93% 95%    Examination:  General exam: Appears calm and comfortable  Respiratory system: Clear to auscultation. Respiratory effort normal. Cardiovascular system: S1 & S2 heard, RRR. No JVD, murmurs, rubs, gallops or clicks. No pedal edema. Gastrointestinal system: Abdomen is nondistended, soft and nontender. No organomegaly or masses felt. Normal bowel sounds heard. Central nervous system: Alert and oriented. No focal neurological deficits. Extremities: Symmetric 5 x 5 power. Skin: No rashes, lesions or ulcers Psychiatry: Judgement and insight appear normal. Mood & affect appropriate.   The results of significant diagnostics from this hospitalization (including imaging, microbiology, ancillary and laboratory) are listed below for reference.    Basic Metabolic Panel:  Recent Labs Lab 01/29/16 1224 01/31/16 0633 02/01/16 0608  NA 139 141  140  K 3.5 4.1 4.2  CL 106 109 106  CO2 27 24 26   GLUCOSE 141* 99 100*  BUN 19 12 16   CREATININE 1.26* 1.13* 1.40*  CALCIUM 9.1 8.6* 9.1   Liver Function Tests:  Recent Labs Lab 01/29/16 1224  AST 22  ALT 28  ALKPHOS 80  BILITOT 0.5  PROT 7.0  ALBUMIN 3.6   CBC:  Recent Labs Lab 01/29/16 1224 01/30/16 0605  WBC 8.1 6.9  NEUTROABS 5.2  --   HGB 13.1 12.2  HCT 39.2 37.8  MCV 93.3 94.7  PLT 236 213   Cardiac Enzymes:  Recent Labs Lab 01/29/16 1224  TROPONINI <0.03   BNP: Invalid input(s): POCBNP CBG:  Recent Labs Lab 01/29/16 1133  GLUCAP 149*   Urinalysis    Component Value Date/Time   COLORURINE YELLOW 01/29/2016 1421   APPEARANCEUR HAZY* 01/29/2016 1421   LABSPEC 1.010 01/29/2016 1421   PHURINE 6.5 01/29/2016 1421   GLUCOSEU NEGATIVE 01/29/2016 1421   HGBUR LARGE* 01/29/2016 1421   BILIRUBINUR NEGATIVE 01/29/2016 1421   KETONESUR NEGATIVE 01/29/2016 1421   PROTEINUR TRACE* 01/29/2016 1421   UROBILINOGEN 0.2 08/27/2013 1130   NITRITE NEGATIVE 01/29/2016 1421   LEUKOCYTESUR LARGE* 01/29/2016 1421    Time coordinating discharge: Over 30 minutes  SIGNED:  Erick Blinks, MD  [facility] 02/01/2016, 2:58 PM Pager [phone]  If 7PM-7AM, please contact night-coverage [email or web address] Password [email or web address]  By signing my name below, I, Adron Bene, attest that this documentation has been prepared under the direction and in the presence of Erick Blinks, MD. Electronically Signed: Adron Bene 02/01/2016  I, Dr. Erick Blinks, personally performed the services described in this documentaiton. All medical record entries made by the scribe were at my direction and in my presence. I have reviewed the chart and agree that the record reflects my personal performance and is accurate and complete  Erick Blinks, MD, 02/01/2016 2:58 PM

## 2016-02-01 NOTE — Clinical Social Work Note (Signed)
Clinical Social Work Assessment  Patient Details  Name: Amanda SailorsRebekah P Poss MRN: [id number] Date of Birth: [birth year]  Date of referral:  02/01/16               Reason for consult:  Facility Placement                Permission sought to share information with:    Permission granted to share information::     Name::        Agency::     Relationship::     Contact Information:     Housing/Transportation Living arrangements for the past 2 months:  Single Family Home Source of Information:  Patient Patient Interpreter Needed:  None Criminal Activity/Legal Involvement Pertinent to Current Situation/Hospitalization:  No - Comment as needed Significant Relationships:  Spouse, Adult Children Lives with:  Spouse Do you feel safe going back to the place where you live?  Yes Need for family participation in patient care:  Yes (Comment)  Care giving concerns:  None identified.    Social Worker assessment / plan:  Patient advised that she resides with her husband and at baseline she is independent in her ADLs, drives and ambulates with a walker. Patient was agreeable to SNF recommendation made by PT. Patient advised that she preferred to go to [facility] as she has been there before for rehab.    Employment status:  Retired Database administratornsurance information:  Managed Medicare PT Recommendations:  Skilled Nursing Facility Information / Referral to community resources:  Skilled Nursing Facility  Patient/Family's Response to care:  While patient verbalizes that she would rather go home, she is agreeable to go to SNF.   Patient/Family's Understanding of and Emotional Response to Diagnosis, Current Treatment, and Prognosis:  Patient has understanding of her current diagnosis, treatment and prognosis.  Emotional Assessment Appearance:  Appears stated age Attitude/Demeanor/Rapport:   (Cooperative) Affect (typically observed):  Accepting, Calm Orientation:  Oriented to Self, Oriented to Place, Oriented to  Time,  Oriented to Situation Alcohol / Substance use:  Not Applicable Psych involvement (Current and /or in the community):  No (Comment)  Discharge Needs  Concerns to be addressed:  Discharge Planning Concerns Readmission within the last 30 days:  No Current discharge risk:  None Barriers to Discharge:  Insurance Authorization   Annice NeedySettle, Roland Lipke D, LCSW 02/01/2016, 12:25 PM

## 2016-02-11 DIAGNOSIS — N390 Urinary tract infection, site not specified: Secondary | ICD-10-CM | POA: Diagnosis not present

## 2016-02-11 DIAGNOSIS — M129 Arthropathy, unspecified: Secondary | ICD-10-CM | POA: Diagnosis not present

## 2016-02-11 DIAGNOSIS — E039 Hypothyroidism, unspecified: Secondary | ICD-10-CM | POA: Diagnosis not present

## 2016-02-11 DIAGNOSIS — R69 Illness, unspecified: Secondary | ICD-10-CM | POA: Diagnosis not present

## 2016-02-15 DIAGNOSIS — J449 Chronic obstructive pulmonary disease, unspecified: Secondary | ICD-10-CM | POA: Diagnosis not present

## 2016-02-15 DIAGNOSIS — M810 Age-related osteoporosis without current pathological fracture: Secondary | ICD-10-CM | POA: Diagnosis not present

## 2016-02-15 DIAGNOSIS — M6281 Muscle weakness (generalized): Secondary | ICD-10-CM | POA: Diagnosis not present

## 2016-02-15 DIAGNOSIS — E785 Hyperlipidemia, unspecified: Secondary | ICD-10-CM | POA: Diagnosis not present

## 2016-02-15 DIAGNOSIS — I10 Essential (primary) hypertension: Secondary | ICD-10-CM | POA: Diagnosis not present

## 2016-02-15 DIAGNOSIS — M1990 Unspecified osteoarthritis, unspecified site: Secondary | ICD-10-CM | POA: Diagnosis not present

## 2016-02-15 DIAGNOSIS — Z9181 History of falling: Secondary | ICD-10-CM | POA: Diagnosis not present

## 2016-02-15 DIAGNOSIS — R69 Illness, unspecified: Secondary | ICD-10-CM | POA: Diagnosis not present

## 2016-02-15 DIAGNOSIS — R42 Dizziness and giddiness: Secondary | ICD-10-CM | POA: Diagnosis not present

## 2016-02-15 DIAGNOSIS — E039 Hypothyroidism, unspecified: Secondary | ICD-10-CM | POA: Diagnosis not present

## 2016-02-22 DIAGNOSIS — R5383 Other fatigue: Secondary | ICD-10-CM | POA: Diagnosis not present

## 2016-02-22 DIAGNOSIS — R0600 Dyspnea, unspecified: Secondary | ICD-10-CM | POA: Diagnosis not present

## 2016-02-22 DIAGNOSIS — R002 Palpitations: Secondary | ICD-10-CM | POA: Diagnosis not present

## 2016-02-22 DIAGNOSIS — J449 Chronic obstructive pulmonary disease, unspecified: Secondary | ICD-10-CM | POA: Diagnosis not present

## 2016-02-22 DIAGNOSIS — N3020 Other chronic cystitis without hematuria: Secondary | ICD-10-CM | POA: Diagnosis not present

## 2016-04-03 ENCOUNTER — Encounter (HOSPITAL_COMMUNITY): Payer: Self-pay | Admitting: Emergency Medicine

## 2016-04-03 ENCOUNTER — Emergency Department (HOSPITAL_COMMUNITY): Payer: Medicare HMO

## 2016-04-03 ENCOUNTER — Emergency Department (HOSPITAL_COMMUNITY)
Admission: EM | Admit: 2016-04-03 | Discharge: 2016-04-03 | Disposition: A | Discharge status: Home / Self Care | Payer: Medicare HMO | Attending: Emergency Medicine | Admitting: Emergency Medicine

## 2016-04-03 DIAGNOSIS — N390 Urinary tract infection, site not specified: Secondary | ICD-10-CM | POA: Insufficient documentation

## 2016-04-03 DIAGNOSIS — Z79899 Other long term (current) drug therapy: Secondary | ICD-10-CM | POA: Diagnosis not present

## 2016-04-03 DIAGNOSIS — M25562 Pain in left knee: Secondary | ICD-10-CM | POA: Insufficient documentation

## 2016-04-03 DIAGNOSIS — M25561 Pain in right knee: Secondary | ICD-10-CM | POA: Insufficient documentation

## 2016-04-03 DIAGNOSIS — R69 Illness, unspecified: Secondary | ICD-10-CM | POA: Diagnosis not present

## 2016-04-03 DIAGNOSIS — I10 Essential (primary) hypertension: Secondary | ICD-10-CM | POA: Diagnosis not present

## 2016-04-03 DIAGNOSIS — F17210 Nicotine dependence, cigarettes, uncomplicated: Secondary | ICD-10-CM | POA: Diagnosis not present

## 2016-04-03 DIAGNOSIS — E039 Hypothyroidism, unspecified: Secondary | ICD-10-CM | POA: Diagnosis not present

## 2016-04-03 DIAGNOSIS — M25569 Pain in unspecified knee: Secondary | ICD-10-CM | POA: Diagnosis not present

## 2016-04-03 LAB — URINALYSIS, ROUTINE W REFLEX MICROSCOPIC
Bilirubin Urine: NEGATIVE
GLUCOSE, UA: NEGATIVE mg/dL
Ketones, ur: NEGATIVE mg/dL
NITRITE: NEGATIVE
PH: 7 (ref 5.0–8.0)
Protein, ur: 100 mg/dL — AB
SPECIFIC GRAVITY, URINE: 1.01 (ref 1.005–1.030)

## 2016-04-03 LAB — URINE MICROSCOPIC-ADD ON

## 2016-04-03 MED ORDER — CEPHALEXIN 500 MG PO CAPS: 500.0000 mg | ORAL_CAPSULE | Freq: Four times a day (QID) | ORAL | 0 refills | Status: DC

## 2016-04-03 MED ORDER — HYDROCODONE-ACETAMINOPHEN 5-325 MG PO TABS
1.0000 | ORAL_TABLET | Freq: Once | ORAL | Status: AC
  Administered 2016-04-03: 1 via ORAL
  Filled 2016-04-03: qty 1

## 2016-04-03 MED ORDER — CEPHALEXIN 500 MG PO CAPS
500.0000 mg | ORAL_CAPSULE | Freq: Once | ORAL | Status: AC
  Administered 2016-04-03: 500 mg via ORAL
  Filled 2016-04-03: qty 1

## 2016-04-03 MED ORDER — HYDROCODONE-ACETAMINOPHEN 5-325 MG PO TABS: 1.0000 | ORAL_TABLET | Freq: Four times a day (QID) | ORAL | 0 refills | Status: DC | PRN

## 2016-04-03 NOTE — ED Notes (Signed)
Urine obtained, per pt she had a UTI, was inpt for several days. She then was signed out by her spouse who reports "they weren't doing anything for her". She has had home PT for the last several weeks and was doing well until today. She reports pain a 9/10 and reports pain from knees to feet.

## 2016-04-03 NOTE — ED Notes (Signed)
Xrays complete

## 2016-04-03 NOTE — Discharge Instructions (Signed)
Follow-up with your orthopedic doctor and primary care doctor regarding the bilateral knee pain. X-rays without any acute findings. Take the pain medication as directed and needed. Urinalysis with questionable urinary tract infection. It was sent for culture and sensitivities. In the meantime take the Keflex as directed. Return for any new or worse symptoms.

## 2016-04-03 NOTE — ED Triage Notes (Signed)
Per EMS: Pt having bilateral knee pain since 2011.  Pt husband mentions possible UTI.   cbg 118, temp 97.6, 108/72, 84hr

## 2016-04-03 NOTE — ED Provider Notes (Signed)
[facility]-EMERGENCY DEPT Provider Note   CSN: [id number] Arrival date & time: 04/03/16  1628     History   Chief Complaint Chief Complaint  Patient presents with  . Knee Pain    HPI Amanda Armstrong is a 70 y.o. female.  Patient with complaint of bilateral knee pain right greater than left. Then present for several months. But worse lately. Patient currently receiving physical therapy at home for the past 3 weeks. Patient status post knee replacement and 2012 in 2013 by Delbert HarnessMurphy Wainer. Patient states pains been increased today. However patient was very active yesterday and was out and about more than usual. Patient normally walks at home with a walker. No falls or recent injuries. Other concern is for possible urinary tract infection. Patient has them frequently. Patient denies fevers nausea vomiting.      Past Medical History:  Diagnosis Date  . Anxiety   . Arthritis    Bilateral Knee DJD  . Chronic pain   . Clostridium difficile carrier    pt stated that she has intermittent sx  . Clostridium difficile infection   . Headache(784.0)    SINUS HEADACHE OCCASSIONALLY  . Hypertension   . Hypothyroidism   . Multifactorial gait disorder   . Osteoporosis   . Vertigo     Patient Active Problem List   Diagnosis Date Noted  . UTI (urinary tract infection) 01/29/2016  . Nicotine dependence 09/07/2015  . Enteritis due to Clostridium difficile 08/29/2013  . Weakness 08/02/2013  . Diarrhea 08/02/2013  . Acute pyelonephritis 05/28/2013  . UTI (lower urinary tract infection) 05/27/2013  . Tachypnea 05/27/2013  . Hyperglycemia 05/27/2013  . Arthritis   . Anxiety   . Hypothyroidism     Past Surgical History:  Procedure Laterality Date  . JOINT REPLACEMENT  11/08/3010   right total knee  . TOTAL KNEE ARTHROPLASTY  11/07/2011   Procedure: TOTAL KNEE ARTHROPLASTY;  Surgeon: Nilda Simmerobert A Wainer, MD;  Location: [facility] OR;  Service: Orthopedics;  Laterality: Left;  DR Thurston HoleWAINER WANTS 90 MINUTES  FOR THIS CASE  . TUBAL LIGATION  1982    OB History    No data available       Home Medications    Prior to Admission medications   Medication Sig Start Date End Date Taking? Authorizing Provider  alendronate (FOSAMAX) 70 MG tablet Take 70 mg by mouth every 7 (seven) days.  04/05/13  Yes Historical Provider, MD  ALPRAZolam Prudy Feeler(XANAX) 1 MG tablet Take 1 tablet (1 mg total) by mouth 3 (three) times daily. 02/01/16  Yes Erick BlinksJehanzeb Memon, MD  atorvastatin (LIPITOR) 20 MG tablet Take 20 mg by mouth at bedtime. 09/14/15  Yes Historical Provider, MD  furosemide (LASIX) 20 MG tablet Take 1 tablet (20 mg total) by mouth daily. Resume in 3 days Patient taking differently: Take 20 mg by mouth daily.  02/01/16  Yes Erick BlinksJehanzeb Memon, MD  levothyroxine (SYNTHROID, LEVOTHROID) 75 MCG tablet Take 75 mcg by mouth daily. 08/29/15  Yes Historical Provider, MD  Probiotic Product (PROBIOTIC PO) Take 1 capsule by mouth daily.   Yes Historical Provider, MD  vitamin B-12 (CYANOCOBALAMIN) 500 MCG tablet Take 500 mcg by mouth daily.   Yes Historical Provider, MD  cephALEXin (KEFLEX) 500 MG capsule Take 1 capsule (500 mg total) by mouth 4 (four) times daily. 04/03/16   Vanetta MuldersScott Mulki Roesler, MD  ciprofloxacin (CIPRO) 500 MG tablet Take 1 tablet (500 mg total) by mouth 2 (two) times daily. Patient not taking: Reported on 04/03/2016  02/01/16   Erick Blinks, MD  HYDROcodone-acetaminophen (NORCO/VICODIN) 5-325 MG tablet Take 1-2 tablets by mouth every 6 (six) hours as needed. 04/03/16   Vanetta Mulders, MD    Family History Family History  Problem Relation Age of Onset  . Heart attack Mother   . COPD Father   . Arthritis Sister   . Heart disease Brother     Social History Social History  Substance Use Topics  . Smoking status: Current Every Day Smoker    Packs/day: 1.00    Years: 48.00    Types: Cigarettes    Last attempt to quit: 06/15/2012  . Smokeless tobacco: Never Used     Comment: 12/29/14 1/2 PPD  . Alcohol use No      Allergies   Iron and Oxycodone   Review of Systems Review of Systems  Constitutional: Negative for fever.  HENT: Negative for congestion.   Respiratory: Negative for shortness of breath.   Cardiovascular: Negative for chest pain and leg swelling.  Gastrointestinal: Negative for abdominal pain.  Genitourinary: Positive for dysuria.  Musculoskeletal: Positive for arthralgias and joint swelling.  Skin: Negative for rash.  Neurological: Negative for headaches.  Hematological: Does not bruise/bleed easily.  Psychiatric/Behavioral: Negative for confusion.     Physical Exam Updated Vital Signs BP 116/60   Pulse 96   Temp 100.4 F (38 C) (Rectal)   Resp 18   Ht 4\' 10"  (1.473 m)   Wt 65.8 kg   SpO2 96%   BMI 30.31 kg/m   Physical Exam  Constitutional: She is oriented to person, place, and time. She appears well-developed and well-nourished. No distress.  HENT:  Head: Normocephalic and atraumatic.  Eyes: EOM are normal. Pupils are equal, round, and reactive to light.  Neck: Normal range of motion.  Cardiovascular: Normal rate, regular rhythm and normal heart sounds.   Pulmonary/Chest: Effort normal and breath sounds normal.  Abdominal: Soft. Bowel sounds are normal. There is no tenderness.  Musculoskeletal: Normal range of motion. She exhibits no edema, tenderness or deformity.  Neurological: She is alert and oriented to person, place, and time. No cranial nerve deficit. She exhibits normal muscle tone. Coordination normal.  Skin: Skin is warm. No rash noted. No erythema.  Nursing note and vitals reviewed.    ED Treatments / Results  Labs (all labs ordered are listed, but only abnormal results are displayed) Labs Reviewed  URINALYSIS, ROUTINE W REFLEX MICROSCOPIC (NOT AT [facility]) - Abnormal; Notable for the following:       Result Value   APPearance CLOUDY (*)    Hgb urine dipstick LARGE (*)    Protein, ur 100 (*)    Leukocytes, UA LARGE (*)    All other  components within normal limits  URINE MICROSCOPIC-ADD ON - Abnormal; Notable for the following:    Squamous Epithelial / LPF TOO NUMEROUS TO COUNT (*)    Bacteria, UA MANY (*)    All other components within normal limits  URINE CULTURE    EKG  EKG Interpretation None       Radiology Dg Knee Complete 4 Views Left  Result Date: 04/03/2016 CLINICAL DATA:  Bilateral knee pain. History of bilateral total knee arthroplasty. No reported injury. EXAM: LEFT KNEE - COMPLETE 4+ VIEW COMPARISON:  None. FINDINGS: Status post left total knee arthroplasty, with no evidence of hardware fracture or loosening. No osseous fracture, joint effusion, dislocation or suspicious focal osseous lesion. Small superior left patellar enthesophyte. Vascular calcifications in the posterior soft tissues.  IMPRESSION: Status post left total knee arthroplasty, with no evidence of hardware complication. No osseous fracture, joint effusion or malalignment. Electronically Signed   By: Delbert Phenix M.D.   On: 04/03/2016 19:47   Dg Knee Complete 4 Views Right  Result Date: 04/03/2016 CLINICAL DATA:  Bilateral knee pain. No reported injury. History of bilateral total knee arthroplasty. EXAM: RIGHT KNEE - COMPLETE 4+ VIEW COMPARISON:  None. FINDINGS: Status post right total knee arthroplasty, with no evidence of hardware fracture or loosening. No osseous fracture, joint effusion, malalignment or suspicious focal osseous lesion. IMPRESSION: Status post right total knee arthroplasty, with no evidence of hardware complication. No joint effusion, osseous fracture or malalignment. Electronically Signed   By: Delbert Phenix M.D.   On: 04/03/2016 19:46    Procedures Procedures (including critical care time)  Medications Ordered in ED Medications  HYDROcodone-acetaminophen (NORCO/VICODIN) 5-325 MG per tablet 1 tablet (1 tablet Oral Given 04/03/16 2011)     Initial Impression / Assessment and Plan / ED Course  I have reviewed the  triage vital signs and the nursing notes.  Pertinent labs & imaging results that were available during my care of the patient were reviewed by me and considered in my medical decision making (see chart for details).  Clinical Course   Patient brought in for 2 concerns. The first concern is bilateral knee pain. Patient's had knee replacement surgery 2012 in 2013. Patient with pain from the knee down this a bit ongoing for several months. Worse today but yesterday patient a very active day and was out and about. Patient normally walks at home with a walker. Right is greater than left. X-rays show no acute findings. There is no erythema there is no swelling. Patient with good strength to both legs. Patient treated here with hydrocodone with some improvement in the pain. Will continue that at home.  The second concern is possible urinary tract infection she gets this frequently. Urinalysis here today could be contamination but possibly could represent urinary tract infection. Culture sent for sensitivities. Patient will be started on Keflex and continued on that at home.   Final Clinical Impressions(s) / ED Diagnoses   Final diagnoses:  Knee pain, right  Left knee pain  UTI (lower urinary tract infection)    New Prescriptions New Prescriptions   CEPHALEXIN (KEFLEX) 500 MG CAPSULE    Take 1 capsule (500 mg total) by mouth 4 (four) times daily.   HYDROCODONE-ACETAMINOPHEN (NORCO/VICODIN) 5-325 MG TABLET    Take 1-2 tablets by mouth every 6 (six) hours as needed.     Vanetta Mulders, MD 04/03/16 2107

## 2016-04-03 NOTE — ED Notes (Signed)
Dr. Zackowski at bedside  

## 2016-04-06 LAB — URINE CULTURE

## 2016-04-07 ENCOUNTER — Telehealth (HOSPITAL_BASED_OUTPATIENT_CLINIC_OR_DEPARTMENT_OTHER): Payer: Self-pay | Admitting: Emergency Medicine

## 2016-04-07 NOTE — Telephone Encounter (Signed)
Post ED Visit - Positive Culture Follow-up: Successful Patient Follow-Up  Culture assessed and recommendations reviewed by: []  Enzo BiNathan Batchelder, Pharm.D. []  Celedonio MiyamotoJeremy Frens, Pharm.D., BCPS []  Garvin FilaMike Maccia, Pharm.D. []  Georgina PillionElizabeth Martin, Pharm.D., BCPS []  Scalp LevelMinh Pham, [address] Rainbow BoulevardPharm.D., BCPS, AAHIVP []  Estella HuskMichelle Turner, Pharm.D., BCPS, AAHIVP []  Tennis Mustassie Stewart, Pharm.D. []  Sherle Poeob Vincent, Vermont[place]arm[place]D. Casilda Carlsaylor Stone PharmD  Positive urine culture  []  Patient discharged without antimicrobial prescription and treatment is now indicated []  Organism is resistant to prescribed ED discharge antimicrobial []  Patient with positive blood cultures  Changes discussed with ED provider: Elizabeth SauerJaime Ward [facility] New antibiotic prescription symptom check, if + symptoms, stop cephalexin, start cipro 250mg  po bid x 3 days  Attempting to contact patient   Berle MullMiller, Kalum Minner 04/07/2016, 12:19 PM

## 2016-04-07 NOTE — Progress Notes (Signed)
ED Antimicrobial Stewardship Positive Culture Follow Up   Trey SailorsRebekah P Amanda Armstrong is an 70 y.o. female who [facility] with a chief complaint of  Chief Complaint  Patient presents with  . Knee Pain    Recent Results (from the past 720 hour(s))  Urine culture     Status: Abnormal   Collection Time: 04/03/16  8:18 PM  Result Value Ref Range Status   Specimen Description URINE, RANDOM  Final   Special Requests NONE  Final   Culture 20,000 COLONIES/mL CITROBACTER FREUNDII (A)  Final   Report Status 04/06/2016 FINAL  Final   Organism ID, Bacteria CITROBACTER FREUNDII (A)  Final      Susceptibility   Citrobacter freundii - MIC*    CEFAZOLIN >=64 RESISTANT Resistant     CEFTRIAXONE <=1 SENSITIVE Sensitive     CIPROFLOXACIN <=0.25 SENSITIVE Sensitive     GENTAMICIN <=1 SENSITIVE Sensitive     IMIPENEM <=0.25 SENSITIVE Sensitive     NITROFURANTOIN <=16 SENSITIVE Sensitive     TRIMETH/SULFA <=20 SENSITIVE Sensitive     PIP/TAZO <=4 SENSITIVE Sensitive     * 20,000 COLONIES/mL CITROBACTER FREUNDII    [x]  Treated with cephalexin, organism resistant to prescribed antimicrobial  New antibiotic prescription: Call patient and follow up if still having symptoms. If so, stop cephalexin and start ciprofloxacin 250 mg BID x 3 days  ED Provider: Shanna CiscoJamie Ward, PA-C  Casilda Carlsaylor Ruthanna Macchia, PharmD. PGY-2 Pharmacy Resident Pager: [phone] 04/07/2016, 9:22 AM

## 2016-04-13 ENCOUNTER — Encounter (HOSPITAL_COMMUNITY): Payer: Self-pay

## 2016-04-13 ENCOUNTER — Emergency Department (HOSPITAL_COMMUNITY)
Admission: EM | Admit: 2016-04-13 | Discharge: 2016-04-13 | Disposition: A | Discharge status: Home / Self Care | Payer: Medicare HMO | Attending: Emergency Medicine | Admitting: Emergency Medicine

## 2016-04-13 DIAGNOSIS — I10 Essential (primary) hypertension: Secondary | ICD-10-CM | POA: Insufficient documentation

## 2016-04-13 DIAGNOSIS — W010XXA Fall on same level from slipping, tripping and stumbling without subsequent striking against object, initial encounter: Secondary | ICD-10-CM | POA: Insufficient documentation

## 2016-04-13 DIAGNOSIS — R42 Dizziness and giddiness: Secondary | ICD-10-CM

## 2016-04-13 DIAGNOSIS — F17210 Nicotine dependence, cigarettes, uncomplicated: Secondary | ICD-10-CM | POA: Diagnosis not present

## 2016-04-13 DIAGNOSIS — Y9301 Activity, walking, marching and hiking: Secondary | ICD-10-CM | POA: Insufficient documentation

## 2016-04-13 DIAGNOSIS — Y999 Unspecified external cause status: Secondary | ICD-10-CM | POA: Insufficient documentation

## 2016-04-13 DIAGNOSIS — W19XXXA Unspecified fall, initial encounter: Secondary | ICD-10-CM

## 2016-04-13 DIAGNOSIS — E039 Hypothyroidism, unspecified: Secondary | ICD-10-CM | POA: Insufficient documentation

## 2016-04-13 DIAGNOSIS — Z7982 Long term (current) use of aspirin: Secondary | ICD-10-CM | POA: Insufficient documentation

## 2016-04-13 DIAGNOSIS — Z79899 Other long term (current) drug therapy: Secondary | ICD-10-CM | POA: Insufficient documentation

## 2016-04-13 DIAGNOSIS — Y929 Unspecified place or not applicable: Secondary | ICD-10-CM | POA: Insufficient documentation

## 2016-04-13 DIAGNOSIS — R69 Illness, unspecified: Secondary | ICD-10-CM | POA: Diagnosis not present

## 2016-04-13 MED ORDER — MECLIZINE HCL 25 MG PO TABS: 25.0000 mg | ORAL_TABLET | Freq: Three times a day (TID) | ORAL | 0 refills | Status: DC | PRN

## 2016-04-13 NOTE — ED Triage Notes (Addendum)
Patient reports to losing balance today while walking with walker and fell onto floor. Denies any injury. States she has had recent falls within the past 3 weeks with generalized weakness. Reports of dizziness for several months.

## 2016-04-13 NOTE — Discharge Instructions (Signed)
Take medication only if you have dizzy spells.  Return for worsening symptoms, including confusion, numbness/weakness on one side, frequent falls, intractable vomiting, new vision or speech changes, fever or any other symptoms concerning to you.   Your exam today does not suggest any serious injury from your fall.

## 2016-04-13 NOTE — ED Provider Notes (Signed)
[facility] DEPT Provider Note   CSN: [id number] Arrival date & time: 04/13/16  1623     History   Chief Complaint Chief Complaint  Patient presents with  . Fall    HPI Amanda Armstrong is a 70 y.o. female.  HPI 70 year old female who presents after mechanical fall. States that she has episodic vertigo over the past several months and she also has history of multifactorial gait disorder. States that today she was walking and felt a dizzy spell come on, causing her to trip over her walker and fall onto her right shoulder and right side. She did not hit her head or have loss of consciousness. Is not on blood thinners. He said that she was initially able to get up, but family was concerned about her dizzy spell and her fall and came in to make sure that she was doing okay. She denies any dizziness currently. Denies any vision or speech changes, focal numbness or weakness, confusion, nausea or vomiting, chest pain, syncope or loss of consciousness. Denies headache, neck pain, back pain, abdominal pain or chest pain. Denies any injuries.  Past Medical History:  Diagnosis Date  . Anxiety   . Arthritis    Bilateral Knee DJD  . Chronic pain   . Clostridium difficile carrier    pt stated that she has intermittent sx  . Clostridium difficile infection   . Headache(784.0)    SINUS HEADACHE OCCASSIONALLY  . Hypertension   . Hypothyroidism   . Multifactorial gait disorder   . Osteoporosis   . Vertigo     Patient Active Problem List   Diagnosis Date Noted  . UTI (urinary tract infection) 01/29/2016  . Nicotine dependence 09/07/2015  . Enteritis due to Clostridium difficile 08/29/2013  . Weakness 08/02/2013  . Diarrhea 08/02/2013  . Acute pyelonephritis 05/28/2013  . UTI (lower urinary tract infection) 05/27/2013  . Tachypnea 05/27/2013  . Hyperglycemia 05/27/2013  . Arthritis   . Anxiety   . Hypothyroidism     Past Surgical History:  Procedure Laterality Date  . JOINT  REPLACEMENT  11/08/3010   right total knee  . TOTAL KNEE ARTHROPLASTY  11/07/2011   Procedure: TOTAL KNEE ARTHROPLASTY;  Surgeon: Nilda Simmer, MD;  Location: [facility] OR;  Service: Orthopedics;  Laterality: Left;  DR Thurston Hole WANTS 90 MINUTES FOR THIS CASE  . TUBAL LIGATION  1982    OB History    No data available       Home Medications    Prior to Admission medications   Medication Sig Start Date End Date Taking? Authorizing Provider  alendronate (FOSAMAX) 70 MG tablet Take 70 mg by mouth every 7 (seven) days.  04/05/13  Yes Historical Provider, MD  ALPRAZolam Prudy Feeler) 1 MG tablet Take 1 tablet (1 mg total) by mouth 3 (three) times daily. 02/01/16  Yes Erick Blinks, MD  aspirin EC 81 MG tablet Take 81 mg by mouth daily.   Yes Historical Provider, MD  atorvastatin (LIPITOR) 20 MG tablet Take 20 mg by mouth at bedtime. 09/14/15  Yes Historical Provider, MD  CRANBERRY EXTRACT PO Take 1 capsule by mouth daily.   Yes Historical Provider, MD  furosemide (LASIX) 20 MG tablet Take 1 tablet (20 mg total) by mouth daily. Resume in 3 days Patient taking differently: Take 20 mg by mouth daily.  02/01/16  Yes Erick Blinks, MD  HYDROcodone-acetaminophen (NORCO/VICODIN) 5-325 MG tablet Take 1-2 tablets by mouth every 6 (six) hours as needed. 04/03/16  Yes Vanetta Mulders,  MD  levothyroxine (SYNTHROID, LEVOTHROID) 75 MCG tablet Take 75 mcg by mouth daily. 08/29/15  Yes Historical Provider, MD  Probiotic Product (PROBIOTIC PO) Take 1 capsule by mouth daily.   Yes Historical Provider, MD  vitamin B-12 (CYANOCOBALAMIN) 500 MCG tablet Take 500 mcg by mouth daily.   Yes Historical Provider, MD  cephALEXin (KEFLEX) 500 MG capsule Take 1 capsule (500 mg total) by mouth 4 (four) times daily. Patient not taking: Reported on 04/13/2016 04/03/16   Vanetta MuldersScott Zackowski, MD  ciprofloxacin (CIPRO) 500 MG tablet Take 1 tablet (500 mg total) by mouth 2 (two) times daily. Patient not taking: Reported on 04/03/2016 02/01/16   Erick BlinksJehanzeb  Memon, MD  meclizine (ANTIVERT) 25 MG tablet Take 1 tablet (25 mg total) by mouth 3 (three) times daily as needed for dizziness. 04/13/16   Lavera Guiseana Duo Liu, MD    Family History Family History  Problem Relation Age of Onset  . Heart attack Mother   . COPD Father   . Arthritis Sister   . Heart disease Brother     Social History Social History  Substance Use Topics  . Smoking status: Current Every Day Smoker    Packs/day: 1.00    Years: 48.00    Types: Cigarettes    Last attempt to quit: 06/15/2012  . Smokeless tobacco: Never Used     Comment: 12/29/14 1/2 PPD  . Alcohol use No     Allergies   Iron and Oxycodone   Review of Systems Review of Systems 10/14 systems reviewed and are negative other than those stated in the HPI   Physical Exam Updated Vital Signs BP 114/57 (BP Location: Left Arm)   Pulse 85   Temp 98.7 F (37.1 C) (Oral)   Resp 16   Ht 4\' 10"  (1.473 m)   Wt 145 lb (65.8 kg)   SpO2 97%   BMI 30.31 kg/m   Physical Exam Physical Exam  Nursing note and vitals reviewed. Constitutional: Well developed, well nourished, non-toxic, and in no acute distress Head: Normocephalic and atraumatic.  Mouth/Throat: Oropharynx is clear and moist.  Neck: Normal range of motion. Neck supple.  no cervical spine tenderness Cardiovascular: Normal rate and regular rhythm.   Pulmonary/Chest: Effort normal and breath sounds normal.  no chest wall tenderness Abdominal: Soft. There is no tenderness. There is no rebound and no guarding.  Musculoskeletal: Normal range of motion.  no deformities. No TLS spine tenderness  Neurological: Alert, no facial droop, fluent speech, moves all extremities symmetrically, pupils equal and reactive to light, extraocular movements intact without bidirectional or vertical nystagmus, no dysmetria with finger to nose, sensation to light touch intact throughout. Skin: Skin is warm and dry.  Psychiatric: Cooperative   ED Treatments / Results   Labs (all labs ordered are listed, but only abnormal results are displayed) Labs Reviewed - No data to display  EKG  EKG Interpretation None       Radiology No results found.  Procedures Procedures (including critical care time)  Medications Ordered in ED Medications - No data to display   Initial Impression / Assessment and Plan / ED Course  I have reviewed the triage vital signs and the nursing notes.  Pertinent labs & imaging results that were available during my care of the patient were reviewed by me and considered in my medical decision making (see chart for details).  Clinical Course    70 year old female who presents after mechanical fall. She reports feeling at her baseline on my  evaluation. She is mentating normally with nonfocal neurological exam. Vital signs are stable. No evidence of severe trauma on exam. She is able to ambulate steadily with walker. Dizzy episode today has not been concerning for CVA or other cerebellar process especially if she is not at her baseline. She has scheduled follow-up with her PCP within one week. She is stable for discharge home. Strict return and follow-up instructions reviewed. She expressed understanding of all discharge instructions and felt comfortable with the plan of care.   Final Clinical Impressions(s) / ED Diagnoses   Final diagnoses:  Fall, initial encounter  Vertigo    New Prescriptions New Prescriptions   MECLIZINE (ANTIVERT) 25 MG TABLET    Take 1 tablet (25 mg total) by mouth 3 (three) times daily as needed for dizziness.     Lavera Guise, MD 04/13/16 1745

## 2016-04-13 NOTE — ED Notes (Signed)
Pt ambulated with walker. No difficulties noted. Pt maintained balance. Denies dizziness.

## 2016-04-15 DIAGNOSIS — M810 Age-related osteoporosis without current pathological fracture: Secondary | ICD-10-CM | POA: Diagnosis not present

## 2016-04-15 DIAGNOSIS — E785 Hyperlipidemia, unspecified: Secondary | ICD-10-CM | POA: Diagnosis not present

## 2016-04-15 DIAGNOSIS — R69 Illness, unspecified: Secondary | ICD-10-CM | POA: Diagnosis not present

## 2016-04-15 DIAGNOSIS — R42 Dizziness and giddiness: Secondary | ICD-10-CM | POA: Diagnosis not present

## 2016-04-15 DIAGNOSIS — E039 Hypothyroidism, unspecified: Secondary | ICD-10-CM | POA: Diagnosis not present

## 2016-04-15 DIAGNOSIS — I10 Essential (primary) hypertension: Secondary | ICD-10-CM | POA: Diagnosis not present

## 2016-04-15 DIAGNOSIS — J449 Chronic obstructive pulmonary disease, unspecified: Secondary | ICD-10-CM | POA: Diagnosis not present

## 2016-04-15 DIAGNOSIS — M6281 Muscle weakness (generalized): Secondary | ICD-10-CM | POA: Diagnosis not present

## 2016-04-15 DIAGNOSIS — Z9181 History of falling: Secondary | ICD-10-CM | POA: Diagnosis not present

## 2016-04-15 DIAGNOSIS — M1990 Unspecified osteoarthritis, unspecified site: Secondary | ICD-10-CM | POA: Diagnosis not present

## 2016-04-18 DIAGNOSIS — J449 Chronic obstructive pulmonary disease, unspecified: Secondary | ICD-10-CM | POA: Diagnosis not present

## 2016-04-18 DIAGNOSIS — R69 Illness, unspecified: Secondary | ICD-10-CM | POA: Diagnosis not present

## 2016-04-18 DIAGNOSIS — R42 Dizziness and giddiness: Secondary | ICD-10-CM | POA: Diagnosis not present

## 2016-04-18 DIAGNOSIS — I10 Essential (primary) hypertension: Secondary | ICD-10-CM | POA: Diagnosis not present

## 2016-04-18 DIAGNOSIS — M810 Age-related osteoporosis without current pathological fracture: Secondary | ICD-10-CM | POA: Diagnosis not present

## 2016-04-18 DIAGNOSIS — M1990 Unspecified osteoarthritis, unspecified site: Secondary | ICD-10-CM | POA: Diagnosis not present

## 2016-04-18 DIAGNOSIS — E785 Hyperlipidemia, unspecified: Secondary | ICD-10-CM | POA: Diagnosis not present

## 2016-04-18 DIAGNOSIS — Z9181 History of falling: Secondary | ICD-10-CM | POA: Diagnosis not present

## 2016-04-18 DIAGNOSIS — E039 Hypothyroidism, unspecified: Secondary | ICD-10-CM | POA: Diagnosis not present

## 2016-04-18 DIAGNOSIS — M6281 Muscle weakness (generalized): Secondary | ICD-10-CM | POA: Diagnosis not present

## 2016-04-20 DIAGNOSIS — J449 Chronic obstructive pulmonary disease, unspecified: Secondary | ICD-10-CM | POA: Diagnosis not present

## 2016-04-20 DIAGNOSIS — I10 Essential (primary) hypertension: Secondary | ICD-10-CM | POA: Diagnosis not present

## 2016-04-20 DIAGNOSIS — Z9181 History of falling: Secondary | ICD-10-CM | POA: Diagnosis not present

## 2016-04-20 DIAGNOSIS — R69 Illness, unspecified: Secondary | ICD-10-CM | POA: Diagnosis not present

## 2016-04-20 DIAGNOSIS — E785 Hyperlipidemia, unspecified: Secondary | ICD-10-CM | POA: Diagnosis not present

## 2016-04-20 DIAGNOSIS — M6281 Muscle weakness (generalized): Secondary | ICD-10-CM | POA: Diagnosis not present

## 2016-04-20 DIAGNOSIS — R42 Dizziness and giddiness: Secondary | ICD-10-CM | POA: Diagnosis not present

## 2016-04-20 DIAGNOSIS — E039 Hypothyroidism, unspecified: Secondary | ICD-10-CM | POA: Diagnosis not present

## 2016-04-20 DIAGNOSIS — M1990 Unspecified osteoarthritis, unspecified site: Secondary | ICD-10-CM | POA: Diagnosis not present

## 2016-04-20 DIAGNOSIS — M810 Age-related osteoporosis without current pathological fracture: Secondary | ICD-10-CM | POA: Diagnosis not present

## 2016-04-21 DIAGNOSIS — J449 Chronic obstructive pulmonary disease, unspecified: Secondary | ICD-10-CM | POA: Diagnosis not present

## 2016-04-21 DIAGNOSIS — Z6829 Body mass index (BMI) 29.0-29.9, adult: Secondary | ICD-10-CM | POA: Diagnosis not present

## 2016-04-21 DIAGNOSIS — R69 Illness, unspecified: Secondary | ICD-10-CM | POA: Diagnosis not present

## 2016-04-21 DIAGNOSIS — R42 Dizziness and giddiness: Secondary | ICD-10-CM | POA: Diagnosis not present

## 2016-04-21 DIAGNOSIS — Z1389 Encounter for screening for other disorder: Secondary | ICD-10-CM | POA: Diagnosis not present

## 2016-04-25 DIAGNOSIS — M6281 Muscle weakness (generalized): Secondary | ICD-10-CM | POA: Diagnosis not present

## 2016-04-25 DIAGNOSIS — R42 Dizziness and giddiness: Secondary | ICD-10-CM | POA: Diagnosis not present

## 2016-04-25 DIAGNOSIS — E785 Hyperlipidemia, unspecified: Secondary | ICD-10-CM | POA: Diagnosis not present

## 2016-04-25 DIAGNOSIS — E039 Hypothyroidism, unspecified: Secondary | ICD-10-CM | POA: Diagnosis not present

## 2016-04-25 DIAGNOSIS — I10 Essential (primary) hypertension: Secondary | ICD-10-CM | POA: Diagnosis not present

## 2016-04-25 DIAGNOSIS — M810 Age-related osteoporosis without current pathological fracture: Secondary | ICD-10-CM | POA: Diagnosis not present

## 2016-04-25 DIAGNOSIS — Z9181 History of falling: Secondary | ICD-10-CM | POA: Diagnosis not present

## 2016-04-25 DIAGNOSIS — J449 Chronic obstructive pulmonary disease, unspecified: Secondary | ICD-10-CM | POA: Diagnosis not present

## 2016-04-25 DIAGNOSIS — M1990 Unspecified osteoarthritis, unspecified site: Secondary | ICD-10-CM | POA: Diagnosis not present

## 2016-04-25 DIAGNOSIS — R69 Illness, unspecified: Secondary | ICD-10-CM | POA: Diagnosis not present

## 2016-04-28 DIAGNOSIS — E785 Hyperlipidemia, unspecified: Secondary | ICD-10-CM | POA: Diagnosis not present

## 2016-04-28 DIAGNOSIS — E039 Hypothyroidism, unspecified: Secondary | ICD-10-CM | POA: Diagnosis not present

## 2016-04-28 DIAGNOSIS — Z9181 History of falling: Secondary | ICD-10-CM | POA: Diagnosis not present

## 2016-04-28 DIAGNOSIS — R42 Dizziness and giddiness: Secondary | ICD-10-CM | POA: Diagnosis not present

## 2016-04-28 DIAGNOSIS — M6281 Muscle weakness (generalized): Secondary | ICD-10-CM | POA: Diagnosis not present

## 2016-04-28 DIAGNOSIS — M1990 Unspecified osteoarthritis, unspecified site: Secondary | ICD-10-CM | POA: Diagnosis not present

## 2016-04-28 DIAGNOSIS — J449 Chronic obstructive pulmonary disease, unspecified: Secondary | ICD-10-CM | POA: Diagnosis not present

## 2016-04-28 DIAGNOSIS — M810 Age-related osteoporosis without current pathological fracture: Secondary | ICD-10-CM | POA: Diagnosis not present

## 2016-04-28 DIAGNOSIS — R69 Illness, unspecified: Secondary | ICD-10-CM | POA: Diagnosis not present

## 2016-04-28 DIAGNOSIS — I10 Essential (primary) hypertension: Secondary | ICD-10-CM | POA: Diagnosis not present

## 2016-05-02 DIAGNOSIS — M6281 Muscle weakness (generalized): Secondary | ICD-10-CM | POA: Diagnosis not present

## 2016-05-02 DIAGNOSIS — M810 Age-related osteoporosis without current pathological fracture: Secondary | ICD-10-CM | POA: Diagnosis not present

## 2016-05-02 DIAGNOSIS — I10 Essential (primary) hypertension: Secondary | ICD-10-CM | POA: Diagnosis not present

## 2016-05-02 DIAGNOSIS — J449 Chronic obstructive pulmonary disease, unspecified: Secondary | ICD-10-CM | POA: Diagnosis not present

## 2016-05-02 DIAGNOSIS — E785 Hyperlipidemia, unspecified: Secondary | ICD-10-CM | POA: Diagnosis not present

## 2016-05-02 DIAGNOSIS — E039 Hypothyroidism, unspecified: Secondary | ICD-10-CM | POA: Diagnosis not present

## 2016-05-02 DIAGNOSIS — Z9181 History of falling: Secondary | ICD-10-CM | POA: Diagnosis not present

## 2016-05-02 DIAGNOSIS — M1990 Unspecified osteoarthritis, unspecified site: Secondary | ICD-10-CM | POA: Diagnosis not present

## 2016-05-02 DIAGNOSIS — R42 Dizziness and giddiness: Secondary | ICD-10-CM | POA: Diagnosis not present

## 2016-05-02 DIAGNOSIS — R69 Illness, unspecified: Secondary | ICD-10-CM | POA: Diagnosis not present

## 2016-05-03 DIAGNOSIS — Z96653 Presence of artificial knee joint, bilateral: Secondary | ICD-10-CM | POA: Diagnosis not present

## 2016-05-04 DIAGNOSIS — M6281 Muscle weakness (generalized): Secondary | ICD-10-CM | POA: Diagnosis not present

## 2016-05-04 DIAGNOSIS — E039 Hypothyroidism, unspecified: Secondary | ICD-10-CM | POA: Diagnosis not present

## 2016-05-04 DIAGNOSIS — R42 Dizziness and giddiness: Secondary | ICD-10-CM | POA: Diagnosis not present

## 2016-05-04 DIAGNOSIS — E785 Hyperlipidemia, unspecified: Secondary | ICD-10-CM | POA: Diagnosis not present

## 2016-05-04 DIAGNOSIS — J449 Chronic obstructive pulmonary disease, unspecified: Secondary | ICD-10-CM | POA: Diagnosis not present

## 2016-05-04 DIAGNOSIS — Z9181 History of falling: Secondary | ICD-10-CM | POA: Diagnosis not present

## 2016-05-04 DIAGNOSIS — I10 Essential (primary) hypertension: Secondary | ICD-10-CM | POA: Diagnosis not present

## 2016-05-04 DIAGNOSIS — R69 Illness, unspecified: Secondary | ICD-10-CM | POA: Diagnosis not present

## 2016-05-04 DIAGNOSIS — M810 Age-related osteoporosis without current pathological fracture: Secondary | ICD-10-CM | POA: Diagnosis not present

## 2016-05-04 DIAGNOSIS — M1990 Unspecified osteoarthritis, unspecified site: Secondary | ICD-10-CM | POA: Diagnosis not present

## 2016-05-09 DIAGNOSIS — M6281 Muscle weakness (generalized): Secondary | ICD-10-CM | POA: Diagnosis not present

## 2016-05-09 DIAGNOSIS — J449 Chronic obstructive pulmonary disease, unspecified: Secondary | ICD-10-CM | POA: Diagnosis not present

## 2016-05-09 DIAGNOSIS — E039 Hypothyroidism, unspecified: Secondary | ICD-10-CM | POA: Diagnosis not present

## 2016-05-09 DIAGNOSIS — Z9181 History of falling: Secondary | ICD-10-CM | POA: Diagnosis not present

## 2016-05-09 DIAGNOSIS — I10 Essential (primary) hypertension: Secondary | ICD-10-CM | POA: Diagnosis not present

## 2016-05-09 DIAGNOSIS — R69 Illness, unspecified: Secondary | ICD-10-CM | POA: Diagnosis not present

## 2016-05-09 DIAGNOSIS — M810 Age-related osteoporosis without current pathological fracture: Secondary | ICD-10-CM | POA: Diagnosis not present

## 2016-05-09 DIAGNOSIS — R42 Dizziness and giddiness: Secondary | ICD-10-CM | POA: Diagnosis not present

## 2016-05-09 DIAGNOSIS — E785 Hyperlipidemia, unspecified: Secondary | ICD-10-CM | POA: Diagnosis not present

## 2016-05-09 DIAGNOSIS — M1990 Unspecified osteoarthritis, unspecified site: Secondary | ICD-10-CM | POA: Diagnosis not present

## 2016-05-10 ENCOUNTER — Telehealth (HOSPITAL_BASED_OUTPATIENT_CLINIC_OR_DEPARTMENT_OTHER): Payer: Self-pay | Admitting: Emergency Medicine

## 2016-05-10 NOTE — Telephone Encounter (Signed)
Lost to followup 

## 2016-05-12 DIAGNOSIS — M1990 Unspecified osteoarthritis, unspecified site: Secondary | ICD-10-CM | POA: Diagnosis not present

## 2016-05-12 DIAGNOSIS — I10 Essential (primary) hypertension: Secondary | ICD-10-CM | POA: Diagnosis not present

## 2016-05-12 DIAGNOSIS — E785 Hyperlipidemia, unspecified: Secondary | ICD-10-CM | POA: Diagnosis not present

## 2016-05-12 DIAGNOSIS — J449 Chronic obstructive pulmonary disease, unspecified: Secondary | ICD-10-CM | POA: Diagnosis not present

## 2016-05-12 DIAGNOSIS — Z9181 History of falling: Secondary | ICD-10-CM | POA: Diagnosis not present

## 2016-05-12 DIAGNOSIS — R69 Illness, unspecified: Secondary | ICD-10-CM | POA: Diagnosis not present

## 2016-05-12 DIAGNOSIS — R42 Dizziness and giddiness: Secondary | ICD-10-CM | POA: Diagnosis not present

## 2016-05-12 DIAGNOSIS — M6281 Muscle weakness (generalized): Secondary | ICD-10-CM | POA: Diagnosis not present

## 2016-05-12 DIAGNOSIS — E039 Hypothyroidism, unspecified: Secondary | ICD-10-CM | POA: Diagnosis not present

## 2016-05-12 DIAGNOSIS — M810 Age-related osteoporosis without current pathological fracture: Secondary | ICD-10-CM | POA: Diagnosis not present

## 2016-05-16 DIAGNOSIS — M6281 Muscle weakness (generalized): Secondary | ICD-10-CM | POA: Diagnosis not present

## 2016-05-16 DIAGNOSIS — Z9181 History of falling: Secondary | ICD-10-CM | POA: Diagnosis not present

## 2016-05-16 DIAGNOSIS — I10 Essential (primary) hypertension: Secondary | ICD-10-CM | POA: Diagnosis not present

## 2016-05-16 DIAGNOSIS — R69 Illness, unspecified: Secondary | ICD-10-CM | POA: Diagnosis not present

## 2016-05-16 DIAGNOSIS — R42 Dizziness and giddiness: Secondary | ICD-10-CM | POA: Diagnosis not present

## 2016-05-16 DIAGNOSIS — J449 Chronic obstructive pulmonary disease, unspecified: Secondary | ICD-10-CM | POA: Diagnosis not present

## 2016-05-16 DIAGNOSIS — E785 Hyperlipidemia, unspecified: Secondary | ICD-10-CM | POA: Diagnosis not present

## 2016-05-16 DIAGNOSIS — M810 Age-related osteoporosis without current pathological fracture: Secondary | ICD-10-CM | POA: Diagnosis not present

## 2016-05-16 DIAGNOSIS — E039 Hypothyroidism, unspecified: Secondary | ICD-10-CM | POA: Diagnosis not present

## 2016-05-16 DIAGNOSIS — M1990 Unspecified osteoarthritis, unspecified site: Secondary | ICD-10-CM | POA: Diagnosis not present

## 2016-05-17 DIAGNOSIS — Z23 Encounter for immunization: Secondary | ICD-10-CM | POA: Diagnosis not present

## 2016-05-17 DIAGNOSIS — E063 Autoimmune thyroiditis: Secondary | ICD-10-CM | POA: Diagnosis not present

## 2016-05-17 DIAGNOSIS — Z6829 Body mass index (BMI) 29.0-29.9, adult: Secondary | ICD-10-CM | POA: Diagnosis not present

## 2016-05-17 DIAGNOSIS — J449 Chronic obstructive pulmonary disease, unspecified: Secondary | ICD-10-CM | POA: Diagnosis not present

## 2016-05-17 DIAGNOSIS — Z0001 Encounter for general adult medical examination with abnormal findings: Secondary | ICD-10-CM | POA: Diagnosis not present

## 2016-05-17 DIAGNOSIS — R69 Illness, unspecified: Secondary | ICD-10-CM | POA: Diagnosis not present

## 2016-05-24 DIAGNOSIS — J449 Chronic obstructive pulmonary disease, unspecified: Secondary | ICD-10-CM | POA: Diagnosis not present

## 2016-05-24 DIAGNOSIS — M6281 Muscle weakness (generalized): Secondary | ICD-10-CM | POA: Diagnosis not present

## 2016-05-24 DIAGNOSIS — I10 Essential (primary) hypertension: Secondary | ICD-10-CM | POA: Diagnosis not present

## 2016-05-24 DIAGNOSIS — Z9181 History of falling: Secondary | ICD-10-CM | POA: Diagnosis not present

## 2016-05-24 DIAGNOSIS — E039 Hypothyroidism, unspecified: Secondary | ICD-10-CM | POA: Diagnosis not present

## 2016-05-24 DIAGNOSIS — M810 Age-related osteoporosis without current pathological fracture: Secondary | ICD-10-CM | POA: Diagnosis not present

## 2016-05-24 DIAGNOSIS — R69 Illness, unspecified: Secondary | ICD-10-CM | POA: Diagnosis not present

## 2016-05-24 DIAGNOSIS — M1990 Unspecified osteoarthritis, unspecified site: Secondary | ICD-10-CM | POA: Diagnosis not present

## 2016-05-24 DIAGNOSIS — R42 Dizziness and giddiness: Secondary | ICD-10-CM | POA: Diagnosis not present

## 2016-05-24 DIAGNOSIS — E785 Hyperlipidemia, unspecified: Secondary | ICD-10-CM | POA: Diagnosis not present

## 2016-05-26 DIAGNOSIS — M6281 Muscle weakness (generalized): Secondary | ICD-10-CM | POA: Diagnosis not present

## 2016-05-26 DIAGNOSIS — J449 Chronic obstructive pulmonary disease, unspecified: Secondary | ICD-10-CM | POA: Diagnosis not present

## 2016-05-26 DIAGNOSIS — I10 Essential (primary) hypertension: Secondary | ICD-10-CM | POA: Diagnosis not present

## 2016-05-26 DIAGNOSIS — M810 Age-related osteoporosis without current pathological fracture: Secondary | ICD-10-CM | POA: Diagnosis not present

## 2016-05-26 DIAGNOSIS — E039 Hypothyroidism, unspecified: Secondary | ICD-10-CM | POA: Diagnosis not present

## 2016-05-26 DIAGNOSIS — E785 Hyperlipidemia, unspecified: Secondary | ICD-10-CM | POA: Diagnosis not present

## 2016-05-26 DIAGNOSIS — R69 Illness, unspecified: Secondary | ICD-10-CM | POA: Diagnosis not present

## 2016-05-26 DIAGNOSIS — R42 Dizziness and giddiness: Secondary | ICD-10-CM | POA: Diagnosis not present

## 2016-05-26 DIAGNOSIS — Z9181 History of falling: Secondary | ICD-10-CM | POA: Diagnosis not present

## 2016-05-26 DIAGNOSIS — M1990 Unspecified osteoarthritis, unspecified site: Secondary | ICD-10-CM | POA: Diagnosis not present

## 2016-05-31 DIAGNOSIS — I10 Essential (primary) hypertension: Secondary | ICD-10-CM | POA: Diagnosis not present

## 2016-05-31 DIAGNOSIS — R69 Illness, unspecified: Secondary | ICD-10-CM | POA: Diagnosis not present

## 2016-05-31 DIAGNOSIS — E785 Hyperlipidemia, unspecified: Secondary | ICD-10-CM | POA: Diagnosis not present

## 2016-05-31 DIAGNOSIS — M1990 Unspecified osteoarthritis, unspecified site: Secondary | ICD-10-CM | POA: Diagnosis not present

## 2016-05-31 DIAGNOSIS — E039 Hypothyroidism, unspecified: Secondary | ICD-10-CM | POA: Diagnosis not present

## 2016-05-31 DIAGNOSIS — R42 Dizziness and giddiness: Secondary | ICD-10-CM | POA: Diagnosis not present

## 2016-05-31 DIAGNOSIS — Z9181 History of falling: Secondary | ICD-10-CM | POA: Diagnosis not present

## 2016-05-31 DIAGNOSIS — M810 Age-related osteoporosis without current pathological fracture: Secondary | ICD-10-CM | POA: Diagnosis not present

## 2016-05-31 DIAGNOSIS — J449 Chronic obstructive pulmonary disease, unspecified: Secondary | ICD-10-CM | POA: Diagnosis not present

## 2016-05-31 DIAGNOSIS — M6281 Muscle weakness (generalized): Secondary | ICD-10-CM | POA: Diagnosis not present

## 2016-06-03 DIAGNOSIS — M6281 Muscle weakness (generalized): Secondary | ICD-10-CM | POA: Diagnosis not present

## 2016-06-03 DIAGNOSIS — R42 Dizziness and giddiness: Secondary | ICD-10-CM | POA: Diagnosis not present

## 2016-06-03 DIAGNOSIS — I10 Essential (primary) hypertension: Secondary | ICD-10-CM | POA: Diagnosis not present

## 2016-06-03 DIAGNOSIS — M1990 Unspecified osteoarthritis, unspecified site: Secondary | ICD-10-CM | POA: Diagnosis not present

## 2016-06-03 DIAGNOSIS — R69 Illness, unspecified: Secondary | ICD-10-CM | POA: Diagnosis not present

## 2016-06-03 DIAGNOSIS — M810 Age-related osteoporosis without current pathological fracture: Secondary | ICD-10-CM | POA: Diagnosis not present

## 2016-06-03 DIAGNOSIS — J449 Chronic obstructive pulmonary disease, unspecified: Secondary | ICD-10-CM | POA: Diagnosis not present

## 2016-06-03 DIAGNOSIS — E039 Hypothyroidism, unspecified: Secondary | ICD-10-CM | POA: Diagnosis not present

## 2016-06-03 DIAGNOSIS — E785 Hyperlipidemia, unspecified: Secondary | ICD-10-CM | POA: Diagnosis not present

## 2016-06-03 DIAGNOSIS — Z9181 History of falling: Secondary | ICD-10-CM | POA: Diagnosis not present

## 2016-06-06 DIAGNOSIS — E039 Hypothyroidism, unspecified: Secondary | ICD-10-CM | POA: Diagnosis not present

## 2016-06-06 DIAGNOSIS — J449 Chronic obstructive pulmonary disease, unspecified: Secondary | ICD-10-CM | POA: Diagnosis not present

## 2016-06-06 DIAGNOSIS — M6281 Muscle weakness (generalized): Secondary | ICD-10-CM | POA: Diagnosis not present

## 2016-06-06 DIAGNOSIS — R42 Dizziness and giddiness: Secondary | ICD-10-CM | POA: Diagnosis not present

## 2016-06-06 DIAGNOSIS — M1990 Unspecified osteoarthritis, unspecified site: Secondary | ICD-10-CM | POA: Diagnosis not present

## 2016-06-06 DIAGNOSIS — R69 Illness, unspecified: Secondary | ICD-10-CM | POA: Diagnosis not present

## 2016-06-06 DIAGNOSIS — M810 Age-related osteoporosis without current pathological fracture: Secondary | ICD-10-CM | POA: Diagnosis not present

## 2016-06-06 DIAGNOSIS — I10 Essential (primary) hypertension: Secondary | ICD-10-CM | POA: Diagnosis not present

## 2016-06-06 DIAGNOSIS — E785 Hyperlipidemia, unspecified: Secondary | ICD-10-CM | POA: Diagnosis not present

## 2016-06-06 DIAGNOSIS — Z9181 History of falling: Secondary | ICD-10-CM | POA: Diagnosis not present

## 2016-06-08 DIAGNOSIS — E785 Hyperlipidemia, unspecified: Secondary | ICD-10-CM | POA: Diagnosis not present

## 2016-06-08 DIAGNOSIS — M6281 Muscle weakness (generalized): Secondary | ICD-10-CM | POA: Diagnosis not present

## 2016-06-08 DIAGNOSIS — E039 Hypothyroidism, unspecified: Secondary | ICD-10-CM | POA: Diagnosis not present

## 2016-06-08 DIAGNOSIS — R42 Dizziness and giddiness: Secondary | ICD-10-CM | POA: Diagnosis not present

## 2016-06-08 DIAGNOSIS — R69 Illness, unspecified: Secondary | ICD-10-CM | POA: Diagnosis not present

## 2016-06-08 DIAGNOSIS — M810 Age-related osteoporosis without current pathological fracture: Secondary | ICD-10-CM | POA: Diagnosis not present

## 2016-06-08 DIAGNOSIS — M1990 Unspecified osteoarthritis, unspecified site: Secondary | ICD-10-CM | POA: Diagnosis not present

## 2016-06-08 DIAGNOSIS — J449 Chronic obstructive pulmonary disease, unspecified: Secondary | ICD-10-CM | POA: Diagnosis not present

## 2016-06-08 DIAGNOSIS — I10 Essential (primary) hypertension: Secondary | ICD-10-CM | POA: Diagnosis not present

## 2016-06-08 DIAGNOSIS — Z9181 History of falling: Secondary | ICD-10-CM | POA: Diagnosis not present

## 2016-06-21 DIAGNOSIS — H2511 Age-related nuclear cataract, right eye: Secondary | ICD-10-CM | POA: Diagnosis not present

## 2016-06-21 DIAGNOSIS — H2513 Age-related nuclear cataract, bilateral: Secondary | ICD-10-CM | POA: Diagnosis not present

## 2016-06-21 DIAGNOSIS — H25013 Cortical age-related cataract, bilateral: Secondary | ICD-10-CM | POA: Diagnosis not present

## 2016-06-24 ENCOUNTER — Encounter (HOSPITAL_COMMUNITY)
Admission: RE | Admit: 2016-06-24 | Discharge: 2016-06-24 | Disposition: A | Discharge status: Home / Self Care | Payer: Medicare HMO | Source: Ambulatory Visit | Attending: Ophthalmology | Admitting: Ophthalmology

## 2016-06-24 ENCOUNTER — Encounter (HOSPITAL_COMMUNITY): Payer: Self-pay

## 2016-06-24 DIAGNOSIS — H269 Unspecified cataract: Secondary | ICD-10-CM | POA: Diagnosis not present

## 2016-06-24 DIAGNOSIS — Z01812 Encounter for preprocedural laboratory examination: Secondary | ICD-10-CM | POA: Insufficient documentation

## 2016-06-24 LAB — BASIC METABOLIC PANEL
ANION GAP: 9 (ref 5–15)
BUN: 25 mg/dL — ABNORMAL HIGH (ref 6–20)
CALCIUM: 9.1 mg/dL (ref 8.9–10.3)
CHLORIDE: 102 mmol/L (ref 101–111)
CO2: 26 mmol/L (ref 22–32)
Creatinine, Ser: 1.73 mg/dL — ABNORMAL HIGH (ref 0.44–1.00)
GFR calc non Af Amer: 28 mL/min — ABNORMAL LOW (ref >60)
GFR, EST AFRICAN AMERICAN: 33 mL/min — AB (ref >60)
Glucose, Bld: 106 mg/dL — ABNORMAL HIGH (ref 65–99)
POTASSIUM: 4 mmol/L (ref 3.5–5.1)
Sodium: 137 mmol/L (ref 135–145)

## 2016-06-24 LAB — CBC WITH DIFFERENTIAL/PLATELET
BASOS ABS: 0.1 10*3/uL (ref 0.0–0.1)
Basophils Relative: 1 %
Eosinophils Absolute: 0.1 10*3/uL (ref 0.0–0.7)
Eosinophils Relative: 2 %
HEMATOCRIT: 41.1 % (ref 36.0–46.0)
HEMOGLOBIN: 13.8 g/dL (ref 12.0–15.0)
LYMPHS PCT: 33 %
Lymphs Abs: 2.3 10*3/uL (ref 0.7–4.0)
MCH: 31.4 pg (ref 26.0–34.0)
MCHC: 33.6 g/dL (ref 30.0–36.0)
MCV: 93.4 fL (ref 78.0–100.0)
MONOS PCT: 7 %
Monocytes Absolute: 0.5 10*3/uL (ref 0.1–1.0)
NEUTROS PCT: 57 %
Neutro Abs: 4 10*3/uL (ref 1.7–7.7)
Platelets: 254 10*3/uL (ref 150–400)
RBC: 4.4 MIL/uL (ref 3.87–5.11)
RDW: 14.9 % (ref 11.5–15.5)
WBC: 7 10*3/uL (ref 4.0–10.5)

## 2016-06-24 NOTE — Patient Instructions (Addendum)
Amanda SailorsRebekah P Armstrong  06/24/2016         Amanda SailorsRebekah P Armstrong  06/24/2016     @PREFPERIOPPHARMACY @   Your procedure is scheduled on 06/28/2016.  [facility]  Call this number if you have problems the morning of surgery:  [phone]   Remember:  Do not eat food or drink liquids after midnight.  Take these medicines the morning of surgery with A SIP OF WATER : Xanax, Norco, Synthroid and Anaprox   Do not wear jewelry, make-up or nail polish.  Do not wear lotions, powders, or perfumes, or deoderant.  Do not shave 48 hours prior to surgery.  Men may shave face and neck.  Do not bring valuables to the hospital.  [facility] is not responsible for any belongings or valuables.  Contacts, dentures or bridgework may not be worn into surgery.  Leave your suitcase in the car.  After surgery it may be brought to your room.  For patients admitted to the hospital, discharge time will be determined by your treatment team.  Patients discharged the day of surgery will not be allowed to drive home.   Name and phone number of your driver:   family Special instructions:  n/a  Please read over the following fact sheets that you were given. Care and Recovery After Surgery   Cataract A cataract is cloudiness on the lens of your eye. The lens is the clear part of your eye that is behind your iris and pupil. The lens focuses light on the retina, which lets you see clearly. When a lens becomes cloudy, vision may become blurry. The clouding can range from a tiny dot to complete cloudiness. As some cataracts develop, they make a person more nearsighted. Other cataracts increase glare. Cataracts can worsen over time, and sometimes the pupil can look white. Cataracts get bigger and they cloud more of the lens, making it difficult to see. Cataracts can affect one eye or both eyes. What are the causes? Most cataracts are associated with age-related eye changes. The eye lens is mostly made  up of water and protein. Normally, this protein is arranged in a way that keeps the lens clear. Cataracts develop when protein begins to clump together over time. This clouds the lens and lets less light pass through to the retina, which causes blurry vision. What increases the risk? This condition is more likely to develop in people who:  Are 72 years of age or older.  Have diabetes.  Have high blood pressure.  Takecertain medicines, such as steroids or hormone replacement therapy.  Have had an eye injury.  Have or have had eye inflammation.  Have a family history of cataracts.  Smoke.  Drink alcohol heavily.  Are frequently exposed to sun or very strong light without eye protection.  Are obese.  Have been exposed to large amounts of radiation, lead, or other toxic substances.  Have had eye surgery. What are the signs or symptoms? The main symptom of a cataract is blurry vision. Your vision may change or get worse over time. Other symptoms include:  Increased glare.  Seeing a bright ring or halo around light.  Poor night vision.  Double vision in one eye.  Having trouble seeing, even while wearing contact lenses or glasses.  Seeing colors that appear faded.  Trouble telling the difference between blue and purple.  Needing frequent changes to your prescription glasses or contacts. How is this diagnosed? This condition is  diagnosed with a medical history and eye exam. You may need to see an eye specialist (optometrist or ophthalmologist). Your health care provider may enlarge (dilate) your pupils with eye drops to see the back of your eye more clearly and look for signs of cataracts or other damage. You may also have tests, including:  A visual acuity test. This uses a chart to determine the smallest letters that you can see from a specific distance.  A slit-lamp exam. This uses a microscope to examine small sections of your eye for abnormalities.  Tonometry.  This test measures the pressure of the fluid inside your eye. How is this treated? Treatment depends on the stage of your cataract. For an early cataract, vision may improve by using different eyeglasses or stronger lighting. If that does not help your vision, surgery may be recommended to remove the cataract. If your health care provider thinks your cataract may be linked to any medicines that you are taking, he or she may change your medicines. Follow these instructions at home: Lifestyle  Use stronger or brighter lighting.  Consider using a magnifying glass for reading or other activities.  Become familiar with your surroundings. Having poor vision can put you at a greater risk for tripping, falling, or bumping into things.  Wear sunglasses and a hat if you are sensitive to bright light or are having problems with glare.  Quit smoking if you smoke. If you need help quitting, talk with your health care provider. General instructions  If you are prescribed new eyeglasses, wear them as told by your health care provider.  Take over-the-counter and prescription medicines only as told by your health care provider. Do not change your medicines unless told by your health care provider.  Do not drive or operate heavy machinery if your vision is blurry, particularly at night.  Keep your blood sugar under control, if you have diabetes.  Keep all follow-up visits as told by your health care provider. This is important. Contact a health care provider if:  Your symptoms get worse.  Your vision affects your ability to perform daily activities.  You have new symptoms.  You have a fever. Get help right away if:  You have sudden vision loss.  You have redness, swelling, or increasing pain in your eye.  You develop a headache and sensitivity to light. This information is not intended to replace advice given to you by your health care provider. Make sure you discuss any questions you have  with your health care provider. Document Released: 07/04/2005 Document Revised: 11/12/2015 Document Reviewed: 01/07/2015 Elsevier Interactive Patient Education  2017 [employer]

## 2016-06-28 ENCOUNTER — Ambulatory Visit (HOSPITAL_COMMUNITY): Payer: Medicare HMO | Admitting: Anesthesiology

## 2016-06-28 ENCOUNTER — Ambulatory Visit (HOSPITAL_COMMUNITY)
Admission: RE | Admit: 2016-06-28 | Discharge: 2016-06-28 | Disposition: A | Discharge status: Home / Self Care | Payer: Medicare HMO | Source: Ambulatory Visit | Attending: Ophthalmology | Admitting: Ophthalmology

## 2016-06-28 ENCOUNTER — Encounter (HOSPITAL_COMMUNITY)
Admission: RE | Disposition: A | Discharge status: Home / Self Care | Payer: Self-pay | Source: Ambulatory Visit | Attending: Ophthalmology

## 2016-06-28 ENCOUNTER — Encounter (HOSPITAL_COMMUNITY): Payer: Self-pay | Admitting: *Deleted

## 2016-06-28 DIAGNOSIS — H2511 Age-related nuclear cataract, right eye: Secondary | ICD-10-CM | POA: Insufficient documentation

## 2016-06-28 DIAGNOSIS — H2512 Age-related nuclear cataract, left eye: Secondary | ICD-10-CM | POA: Diagnosis not present

## 2016-06-28 DIAGNOSIS — H25011 Cortical age-related cataract, right eye: Secondary | ICD-10-CM | POA: Diagnosis not present

## 2016-06-28 HISTORY — PX: CATARACT EXTRACTION W/PHACO: SHX586

## 2016-06-28 SURGERY — PHACOEMULSIFICATION, CATARACT, WITH IOL INSERTION
Anesthesia: Monitor Anesthesia Care | Site: Eye | Laterality: Right

## 2016-06-28 MED ORDER — CYCLOPENTOLATE-PHENYLEPHRINE 0.2-1 % OP SOLN
1.0000 [drp] | OPHTHALMIC | Status: AC
  Administered 2016-06-28 (×3): 1 [drp] via OPHTHALMIC

## 2016-06-28 MED ORDER — TETRACAINE 0.5 % OP SOLN OPTIME - NO CHARGE
OPHTHALMIC | Status: DC | PRN
  Administered 2016-06-28: 2 [drp] via OPHTHALMIC

## 2016-06-28 MED ORDER — BSS IO SOLN
INTRAOCULAR | Status: DC | PRN
  Administered 2016-06-28: 15 mL

## 2016-06-28 MED ORDER — TETRACAINE HCL 0.5 % OP SOLN
1.0000 [drp] | OPHTHALMIC | Status: AC
  Administered 2016-06-28 (×3): 1 [drp] via OPHTHALMIC

## 2016-06-28 MED ORDER — MIDAZOLAM HCL 2 MG/2ML IJ SOLN
1.0000 mg | INTRAMUSCULAR | Status: DC | PRN
  Administered 2016-06-28 (×2): 1 mg via INTRAVENOUS

## 2016-06-28 MED ORDER — LACTATED RINGERS IV SOLN
INTRAVENOUS | Status: DC | PRN
  Administered 2016-06-28: 08:00:00 via INTRAVENOUS

## 2016-06-28 MED ORDER — EPINEPHRINE PF 1 MG/ML IJ SOLN
INTRAOCULAR | Status: DC | PRN
  Administered 2016-06-28: 500 mL

## 2016-06-28 MED ORDER — PROVISC 10 MG/ML IO SOLN
INTRAOCULAR | Status: DC | PRN
  Administered 2016-06-28: 0.85 mL via INTRAOCULAR

## 2016-06-28 MED ORDER — FENTANYL CITRATE (PF) 100 MCG/2ML IJ SOLN
INTRAMUSCULAR | Status: AC
  Filled 2016-06-28: qty 2

## 2016-06-28 MED ORDER — MIDAZOLAM HCL 2 MG/2ML IJ SOLN
INTRAMUSCULAR | Status: AC
  Filled 2016-06-28: qty 2

## 2016-06-28 MED ORDER — EPINEPHRINE PF 1 MG/ML IJ SOLN
INTRAMUSCULAR | Status: AC
  Filled 2016-06-28: qty 1

## 2016-06-28 MED ORDER — KETOROLAC TROMETHAMINE 0.5 % OP SOLN
1.0000 [drp] | OPHTHALMIC | Status: AC
  Administered 2016-06-28 (×3): 1 [drp] via OPHTHALMIC

## 2016-06-28 MED ORDER — FENTANYL CITRATE (PF) 100 MCG/2ML IJ SOLN
25.0000 ug | INTRAMUSCULAR | Status: AC | PRN
  Administered 2016-06-28 (×2): 25 ug via INTRAVENOUS

## 2016-06-28 MED ORDER — PHENYLEPHRINE HCL 2.5 % OP SOLN
1.0000 [drp] | OPHTHALMIC | Status: AC
  Administered 2016-06-28 (×3): 1 [drp] via OPHTHALMIC

## 2016-06-28 MED ORDER — LACTATED RINGERS IV SOLN
INTRAVENOUS | Status: DC
  Administered 2016-06-28: 08:00:00 via INTRAVENOUS

## 2016-06-28 SURGICAL SUPPLY — 9 items
CLOTH BEACON ORANGE TIMEOUT ST (SAFETY) ×2 IMPLANT
EYE SHIELD UNIVERSAL CLEAR (GAUZE/BANDAGES/DRESSINGS) ×2 IMPLANT
GLOVE BIO SURGEON STRL SZ 6.5 (GLOVE) ×2 IMPLANT
GLOVE EXAM NITRILE MD LF STRL (GLOVE) ×2 IMPLANT
LENS ALC ACRYL/TECN (Ophthalmic Related) ×2 IMPLANT
PAD ARMBOARD 7.5X6 YLW CONV (MISCELLANEOUS) ×2 IMPLANT
TAPE SURG TRANSPORE 1 IN (GAUZE/BANDAGES/DRESSINGS) ×1 IMPLANT
TAPE SURGICAL TRANSPORE 1 IN (GAUZE/BANDAGES/DRESSINGS) ×1
WATER STERILE IRR 250ML POUR (IV SOLUTION) ×2 IMPLANT

## 2016-06-28 NOTE — Anesthesia Preprocedure Evaluation (Signed)
Anesthesia Evaluation  Patient identified by MRN, date of birth, ID band Patient awake    Reviewed: Allergy & Precautions, H&P , NPO status , Patient's Chart, lab work & pertinent test results  Airway Mallampati: I  TM Distance: >3 FB Neck ROM: full    Dental  (+) Lower Dentures, Upper Dentures   Pulmonary COPD,  COPD inhaler, Current Smoker,    breath sounds clear to auscultation       Cardiovascular hypertension, Pt. on medications  Rhythm:regular Rate:Normal     Neuro/Psych  Headaches, Anxiety    GI/Hepatic   Endo/Other  Hypothyroidism   Renal/GU Renal InsufficiencyRenal disease     Musculoskeletal   Abdominal   Peds  Hematology   Anesthesia Other Findings   Reproductive/Obstetrics                             Anesthesia Physical Anesthesia Plan  ASA: III  Anesthesia Plan: MAC   Post-op Pain Management:    Induction: Intravenous  Airway Management Planned: Nasal Cannula  Additional Equipment:   Intra-op Plan:   Post-operative Plan:   Informed Consent: I have reviewed the patients History and Physical, chart, labs and discussed the procedure including the risks, benefits and alternatives for the proposed anesthesia with the patient or authorized representative who has indicated his/her understanding and acceptance.     Plan Discussed with:   Anesthesia Plan Comments:         Anesthesia Quick Evaluation  

## 2016-06-28 NOTE — Anesthesia Postprocedure Evaluation (Signed)
Anesthesia Post Note  Patient: Amanda Armstrong  Procedure(s) Performed: Procedure(s) (LRB): CATARACT EXTRACTION PHACO AND INTRAOCULAR LENS PLACEMENT (IOC) (Right)  Patient location during evaluation: PACU Anesthesia Type: MAC Level of consciousness: awake and alert Pain management: pain level controlled Vital Signs Assessment: post-procedure vital signs reviewed and stable Respiratory status: spontaneous breathing, nonlabored ventilation, respiratory function stable and patient connected to nasal cannula oxygen Cardiovascular status: stable and blood pressure returned to baseline Anesthetic complications: no    Last Vitals:  Vitals:   06/28/16 0745 06/28/16 0750  BP: 115/60 (!) 100/51  Pulse:    Resp: 13 15  Temp:      Last Pain:  Vitals:   06/28/16 0717  TempSrc: Oral                 Oliverio Cho

## 2016-06-28 NOTE — Discharge Instructions (Signed)
°  °        [facility] Instructions °[address]- [place] [address]-[place] °    ° °1. Avoid closing eyes tightly. One often closes the eye tightly when laughing, talking, sneezing, coughing or if they feel irritated. At these times, you should be careful not to close your eyes tightly. ° °2. Instill eye drops as instructed. To instill drops in your eye, open it, look up and have someone gently pull the lower lid down and instill a couple of drops inside the lower lid. ° °3. Do not touch upper lid. ° °4. Take Advil or Tylenol for pain. ° °5. You may use either eye for near work, such as reading or sewing and you may watch television. ° °6. You may have your hair done at the beauty parlor at any time. ° °7. Wear dark glasses with or without your own glasses if you are in bright light. ° °8. Call our office at [phone] or [phone] if you have sharp pain in your eye or unusual symptoms. ° °9.  FOLLOW UP WITH DR. SHAPIRO TODAY IN HIS [place] OFFICE AT 2:45pm. ° °  °I have received a copy of the above instructions and will follow them.  ° ° ° °IF YOU ARE IN IMMEDIATE DANGER CALL 911! ° °It is important for you to keep your follow-up appointment with your physician after discharge, OR, for you /your caregiver to make a follow-up appointment with your physician / medical provider after discharge. ° °Show these instructions to the next healthcare provider you see. ° ° °Monitored Anesthesia Care, Care After °These instructions provide you with information about caring for yourself after your procedure. Your health care provider may also give you more specific instructions. Your treatment has been planned according to current medical practices, but problems sometimes occur. Call your health care provider if you have any problems or questions after your procedure. °What can I expect after the procedure? °After your procedure, it is common to: °· Feel sleepy for several hours. °· Feel  clumsy and have poor balance for several hours. °· Feel forgetful about what happened after the procedure. °· Have poor judgment for several hours. °· Feel nauseous or vomit. °· Have a sore throat if you had a breathing tube during the procedure. °Follow these instructions at home: °For at least 24 hours after the procedure:  °· Do not: °¨ Participate in activities in which you could fall or become injured. °¨ Drive. °¨ Use heavy machinery. °¨ Drink alcohol. °¨ Take sleeping pills or medicines that cause drowsiness. °¨ Make important decisions or sign legal documents. °¨ Take care of children on your own. °· Rest. °Eating and drinking °· Follow the diet that is recommended by your health care provider. °· If you vomit, drink water, juice, or soup when you can drink without vomiting. °· Make sure you have little or no nausea before eating solid foods. °General instructions °· Have a responsible adult stay with you until you are awake and alert. °· Take over-the-counter and prescription medicines only as told by your health care provider. °· If you smoke, do not smoke without supervision. °· Keep all follow-up visits as told by your health care provider. This is important. °Contact a health care provider if: °· You keep feeling nauseous or you keep vomiting. °· You feel light-headed. °· You develop a rash. °· You have a fever. °Get help right away if: °· You have trouble breathing. °This   information is not intended to replace advice given to you by your health care provider. Make sure you discuss any questions you have with your health care provider. °Document Released: 10/25/2015 Document Revised: 02/24/2016 Document Reviewed: 10/25/2015 °Elsevier Interactive Patient Education © 2017 [employer]vier Inc. ° ° °

## 2016-06-28 NOTE — H&P (Signed)
The patient was re examined and there is no change in the patients condition since the original H and P. 

## 2016-06-28 NOTE — Transfer of Care (Signed)
Immediate Anesthesia Transfer of Care Note  Patient: Amanda Armstrong  Procedure(s) Performed: Procedure(s) with comments: CATARACT EXTRACTION PHACO AND INTRAOCULAR LENS PLACEMENT (IOC) (Right) - CDE: 11.10  Patient Location: PACU  Anesthesia Type:MAC  Level of Consciousness: awake, alert , oriented and patient cooperative  Airway & Oxygen Therapy: Patient Spontanous Breathing  Post-op Assessment: Report given to RN, Post -op Vital signs reviewed and stable and Patient moving all extremities X 4  Post vital signs: Reviewed and stable  Last Vitals:  Vitals:   06/28/16 0745 06/28/16 0750  BP: 115/60 (!) 100/51  Pulse:    Resp: 13 15  Temp:      Last Pain:  Vitals:   06/28/16 0717  TempSrc: Oral      Patients Stated Pain Goal: 5 (06/28/16 0717)  Complications: No apparent anesthesia complications

## 2016-06-28 NOTE — Op Note (Signed)
Patient brought to the operating room and prepped and draped in the usual manner.  Lid speculum inserted in right eye.  Stab incision made at the twelve o'clock position.  Provisc instilled in the anterior chamber.   A 2.4 mm. Stab incision was made temporally.  An anterior capsulotomy was done with a bent 25 gauge needle.  The nucleus was hydrodissected.  The Phaco tip was inserted in the anterior chamber and the nucleus was emulsified.  CDE was 11.10.  The cortical material was then removed with the I and A tip.  Posterior capsule was the polished.  The anterior chamber was deepened with Provisc.  A 22.5 Diopter Alcon [id number] IOL was then inserted in the capsular bag.  Provisc was then removed with the I and A tip.  The wound was then hydrated.  Patient sent to the Recovery Room in good condition with follow up in my office.  Preoperative Diagnosis:  Nuclear Cataract OD Postoperative Diagnosis:  Same Procedure name: Kelman Phacoemulsification OD with IOL

## 2016-06-30 ENCOUNTER — Encounter (HOSPITAL_COMMUNITY): Payer: Self-pay | Admitting: Ophthalmology

## 2016-07-21 ENCOUNTER — Encounter (HOSPITAL_COMMUNITY)
Admission: RE | Admit: 2016-07-21 | Discharge: 2016-07-21 | Disposition: A | Discharge status: Home / Self Care | Payer: Medicare HMO | Source: Ambulatory Visit | Attending: Ophthalmology | Admitting: Ophthalmology

## 2016-07-21 ENCOUNTER — Encounter (HOSPITAL_COMMUNITY): Payer: Self-pay

## 2016-07-26 ENCOUNTER — Ambulatory Visit (HOSPITAL_COMMUNITY): Payer: Medicare HMO | Admitting: Anesthesiology

## 2016-07-26 ENCOUNTER — Encounter (HOSPITAL_COMMUNITY)
Admission: RE | Disposition: A | Discharge status: Home / Self Care | Payer: Self-pay | Source: Ambulatory Visit | Attending: Ophthalmology

## 2016-07-26 ENCOUNTER — Encounter (HOSPITAL_COMMUNITY): Payer: Self-pay | Admitting: *Deleted

## 2016-07-26 ENCOUNTER — Ambulatory Visit (HOSPITAL_COMMUNITY)
Admission: RE | Admit: 2016-07-26 | Discharge: 2016-07-26 | Disposition: A | Discharge status: Home / Self Care | Payer: Medicare HMO | Source: Ambulatory Visit | Attending: Ophthalmology | Admitting: Ophthalmology

## 2016-07-26 DIAGNOSIS — I10 Essential (primary) hypertension: Secondary | ICD-10-CM | POA: Diagnosis not present

## 2016-07-26 DIAGNOSIS — J449 Chronic obstructive pulmonary disease, unspecified: Secondary | ICD-10-CM | POA: Insufficient documentation

## 2016-07-26 DIAGNOSIS — F419 Anxiety disorder, unspecified: Secondary | ICD-10-CM | POA: Diagnosis not present

## 2016-07-26 DIAGNOSIS — R69 Illness, unspecified: Secondary | ICD-10-CM | POA: Diagnosis not present

## 2016-07-26 DIAGNOSIS — H2512 Age-related nuclear cataract, left eye: Secondary | ICD-10-CM | POA: Diagnosis not present

## 2016-07-26 DIAGNOSIS — H269 Unspecified cataract: Secondary | ICD-10-CM | POA: Diagnosis not present

## 2016-07-26 DIAGNOSIS — E039 Hypothyroidism, unspecified: Secondary | ICD-10-CM | POA: Insufficient documentation

## 2016-07-26 DIAGNOSIS — H25012 Cortical age-related cataract, left eye: Secondary | ICD-10-CM | POA: Diagnosis not present

## 2016-07-26 HISTORY — PX: CATARACT EXTRACTION W/PHACO: SHX586

## 2016-07-26 SURGERY — PHACOEMULSIFICATION, CATARACT, WITH IOL INSERTION
Anesthesia: Monitor Anesthesia Care | Site: Eye | Laterality: Left

## 2016-07-26 MED ORDER — LACTATED RINGERS IV SOLN
INTRAVENOUS | Status: DC
  Administered 2016-07-26: 07:00:00 via INTRAVENOUS

## 2016-07-26 MED ORDER — KETOROLAC TROMETHAMINE 0.5 % OP SOLN
1.0000 [drp] | OPHTHALMIC | Status: AC
  Administered 2016-07-26 (×3): 1 [drp] via OPHTHALMIC

## 2016-07-26 MED ORDER — PHENYLEPHRINE HCL 2.5 % OP SOLN
1.0000 [drp] | OPHTHALMIC | Status: AC
  Administered 2016-07-26 (×3): 1 [drp] via OPHTHALMIC

## 2016-07-26 MED ORDER — MIDAZOLAM HCL 2 MG/2ML IJ SOLN
INTRAMUSCULAR | Status: AC
  Filled 2016-07-26: qty 2

## 2016-07-26 MED ORDER — EPINEPHRINE PF 1 MG/ML IJ SOLN
INTRAMUSCULAR | Status: AC
  Filled 2016-07-26: qty 1

## 2016-07-26 MED ORDER — EPINEPHRINE PF 1 MG/ML IJ SOLN
INTRAMUSCULAR | Status: DC | PRN
  Administered 2016-07-26: 500 mL

## 2016-07-26 MED ORDER — BSS IO SOLN
INTRAOCULAR | Status: DC | PRN
  Administered 2016-07-26: 15 mL

## 2016-07-26 MED ORDER — FENTANYL CITRATE (PF) 100 MCG/2ML IJ SOLN
INTRAMUSCULAR | Status: AC
  Filled 2016-07-26: qty 2

## 2016-07-26 MED ORDER — PROVISC 10 MG/ML IO SOLN
INTRAOCULAR | Status: DC | PRN
  Administered 2016-07-26: 0.85 mL via INTRAOCULAR

## 2016-07-26 MED ORDER — FENTANYL CITRATE (PF) 100 MCG/2ML IJ SOLN
25.0000 ug | INTRAMUSCULAR | Status: AC | PRN
  Administered 2016-07-26 (×2): 25 ug via INTRAVENOUS

## 2016-07-26 MED ORDER — CYCLOPENTOLATE-PHENYLEPHRINE 0.2-1 % OP SOLN
1.0000 [drp] | OPHTHALMIC | Status: AC
  Administered 2016-07-26 (×3): 1 [drp] via OPHTHALMIC

## 2016-07-26 MED ORDER — MIDAZOLAM HCL 2 MG/2ML IJ SOLN
1.0000 mg | INTRAMUSCULAR | Status: DC | PRN
  Administered 2016-07-26: 2 mg via INTRAVENOUS

## 2016-07-26 MED ORDER — TETRACAINE HCL 0.5 % OP SOLN
1.0000 [drp] | OPHTHALMIC | Status: AC
  Administered 2016-07-26 (×2): 1 [drp] via OPHTHALMIC

## 2016-07-26 SURGICAL SUPPLY — 10 items
CLOTH BEACON ORANGE TIMEOUT ST (SAFETY) ×2 IMPLANT
EYE SHIELD UNIVERSAL CLEAR (GAUZE/BANDAGES/DRESSINGS) ×2 IMPLANT
GLOVE BIOGEL PI IND STRL 6.5 (GLOVE) ×2 IMPLANT
GLOVE BIOGEL PI INDICATOR 6.5 (GLOVE) ×2
GLOVE EXAM NITRILE MD LF STRL (GLOVE) ×2 IMPLANT
LENS ALC ACRYL/TECN (Ophthalmic Related) ×2 IMPLANT
PAD ARMBOARD 7.5X6 YLW CONV (MISCELLANEOUS) ×2 IMPLANT
TAPE SURG TRANSPORE 1 IN (GAUZE/BANDAGES/DRESSINGS) ×1 IMPLANT
TAPE SURGICAL TRANSPORE 1 IN (GAUZE/BANDAGES/DRESSINGS) ×1
WATER STERILE IRR 250ML POUR (IV SOLUTION) ×2 IMPLANT

## 2016-07-26 NOTE — Transfer of Care (Signed)
Immediate Anesthesia Transfer of Care Note  Patient: Amanda Armstrong  Procedure(s) Performed: Procedure(s) with comments: CATARACT EXTRACTION PHACO AND INTRAOCULAR LENS PLACEMENT (IOC) (Left) - CDE: 8.88  Patient Location: Short Stay  Anesthesia Type:MAC  Level of Consciousness: awake, alert , oriented and patient cooperative  Airway & Oxygen Therapy: Patient Spontanous Breathing  Post-op Assessment: Report given to RN and Post -op Vital signs reviewed and stable  Post vital signs: Reviewed and stable  Last Vitals:  Vitals:   07/26/16 0730 07/26/16 0735  BP: (!) 94/56 (!) 92/52  Pulse:    Resp: 15 14  Temp:      Last Pain:  Vitals:   07/26/16 0656  TempSrc: Oral      Patients Stated Pain Goal: 5 (45/36/46 8032)  Complications: No apparent anesthesia complications

## 2016-07-26 NOTE — H&P (Signed)
The patient was re examined and there is no change in the patients condition since the original H and P. 

## 2016-07-26 NOTE — Discharge Instructions (Signed)
°  °        [facility] Instructions °[address]- [place] [address]- °    ° °1. Avoid closing eyes tightly. One often closes the eye tightly when laughing, talking, sneezing, coughing or if they feel irritated. At these times, you should be careful not to close your eyes tightly. ° °2. Instill eye drops as instructed. To instill drops in your eye, open it, look up and have someone gently pull the lower lid down and instill a couple of drops inside the lower lid. ° °3. Do not touch upper lid. ° °4. Take Advil or Tylenol for pain. ° °5. You may use either eye for near work, such as reading or sewing and you may watch television. ° °6. You may have your hair done at the beauty parlor at any time. ° °7. Wear dark glasses with or without your own glasses if you are in bright light. ° °8. Call our office at [phone] or [phone] if you have sharp pain in your eye or unusual symptoms. ° °9.  FOLLOW UP WITH DR. SHAPIRO TODAY IN HIS [place] OFFICE AT 2:30pm. ° °  °I have received a copy of the above instructions and will follow them.  ° ° ° °IF YOU ARE IN IMMEDIATE DANGER CALL 911! ° °It is important for you to keep your follow-up appointment with your physician after discharge, OR, for you /your caregiver to make a follow-up appointment with your physician / medical provider after discharge. ° °Show these instructions to the next healthcare provider you see. ° ° °Moderate Conscious Sedation, Adult, Care After °These instructions provide you with information about caring for yourself after your procedure. Your health care provider may also give you more specific instructions. Your treatment has been planned according to current medical practices, but problems sometimes occur. Call your health care provider if you have any problems or questions after your procedure. °What can I expect after the procedure? °After your procedure, it is common: °· To feel sleepy for several  hours. °· To feel clumsy and have poor balance for several hours. °· To have poor judgment for several hours. °· To vomit if you eat too soon. °Follow these instructions at home: °For at least 24 hours after the procedure:  °· Do not: °¨ Participate in activities where you could fall or become injured. °¨ Drive. °¨ Use heavy machinery. °¨ Drink alcohol. °¨ Take sleeping pills or medicines that cause drowsiness. °¨ Make important decisions or sign legal documents. °¨ Take care of children on your own. °· Rest. °Eating and drinking °· Follow the diet recommended by your health care provider. °· If you vomit: °¨ Drink water, juice, or soup when you can drink without vomiting. °¨ Make sure you have little or no nausea before eating solid foods. °General instructions °· Have a responsible adult stay with you until you are awake and alert. °· Take over-the-counter and prescription medicines only as told by your health care provider. °· If you smoke, do not smoke without supervision. °· Keep all follow-up visits as told by your health care provider. This is important. °Contact a health care provider if: °· You keep feeling nauseous or you keep vomiting. °· You feel light-headed. °· You develop a rash. °· You have a fever. °Get help right away if: °· You have trouble breathing. °This information is not intended to replace advice given to you by your health care provider. Make sure you discuss   any questions you have with your health care provider. °Document Released: 04/24/2013 Document Revised: 12/07/2015 Document Reviewed: 10/24/2015 °Elsevier Interactive Patient Education © 2017 [employer] ° °

## 2016-07-26 NOTE — Anesthesia Postprocedure Evaluation (Signed)
Anesthesia Post Note  Patient: Amanda Armstrong  Procedure(s) Performed: Procedure(s) (LRB): CATARACT EXTRACTION PHACO AND INTRAOCULAR LENS PLACEMENT (IOC) (Left)  Patient location during evaluation: Short Stay Anesthesia Type: MAC Level of consciousness: awake and alert and oriented Pain management: pain level controlled Vital Signs Assessment: post-procedure vital signs reviewed and stable Respiratory status: spontaneous breathing Cardiovascular status: stable Postop Assessment: no signs of nausea or vomiting Anesthetic complications: no     Last Vitals:  Vitals:   07/26/16 0730 07/26/16 0735  BP: (!) 94/56 (!) 92/52  Pulse:    Resp: 15 14  Temp:      Last Pain:  Vitals:   07/26/16 0656  TempSrc: Oral                 Burleigh Brockmann A

## 2016-07-26 NOTE — Op Note (Signed)
Patient brought to the operating room and prepped and draped in the usual manner.  Lid speculum inserted in left eye.  Stab incision made at the twelve o'clock position.  Provisc instilled in the anterior chamber.   A 2.4 mm. Stab incision was made temporally.  An anterior capsulotomy was done with a bent 25 gauge needle.  The nucleus was hydrodissected.  The Phaco tip was inserted in the anterior chamber and the nucleus was emulsified.  CDE was 8.88.  The cortical material was then removed with the I and A tip.  Posterior capsule was the polished.  The anterior chamber was deepened with Provisc.  A 22.5 Diopter Alcon [id number] IOL was then inserted in the capsular bag.  Provisc was then removed with the I and A tip.  The wound was then hydrated.  Patient sent to the Recovery Room in good condition with follow up in my office.  Preoperative Diagnosis:  Nuclear Cataract OS Postoperative Diagnosis:  Same Procedure name: Kelman Phacoemulsification OS with IOL

## 2016-07-26 NOTE — Anesthesia Procedure Notes (Signed)
Procedure Name: MAC Date/Time: 07/26/2016 7:46 AM Performed by: Andree Elk, Jermaine Neuharth A Pre-anesthesia Checklist: Patient identified, Timeout performed, Emergency Drugs available, Suction available and Patient being monitored Oxygen Delivery Method: Nasal cannula

## 2016-07-26 NOTE — Anesthesia Preprocedure Evaluation (Signed)
Anesthesia Evaluation  Patient identified by MRN, date of birth, ID band Patient awake    Reviewed: Allergy & Precautions, H&P , NPO status , Patient's Chart, lab work & pertinent test results  Airway Mallampati: I  TM Distance: >3 FB Neck ROM: full    Dental  (+) Lower Dentures, Upper Dentures   Pulmonary COPD,  COPD inhaler, Current Smoker,    breath sounds clear to auscultation       Cardiovascular hypertension, Pt. on medications  Rhythm:regular Rate:Normal     Neuro/Psych  Headaches, Anxiety    GI/Hepatic   Endo/Other  Hypothyroidism   Renal/GU Renal InsufficiencyRenal disease     Musculoskeletal   Abdominal   Peds  Hematology   Anesthesia Other Findings   Reproductive/Obstetrics                             Anesthesia Physical Anesthesia Plan  ASA: III  Anesthesia Plan: MAC   Post-op Pain Management:    Induction: Intravenous  Airway Management Planned: Nasal Cannula  Additional Equipment:   Intra-op Plan:   Post-operative Plan:   Informed Consent: I have reviewed the patients History and Physical, chart, labs and discussed the procedure including the risks, benefits and alternatives for the proposed anesthesia with the patient or authorized representative who has indicated his/her understanding and acceptance.     Plan Discussed with:   Anesthesia Plan Comments:         Anesthesia Quick Evaluation

## 2016-07-27 ENCOUNTER — Encounter (HOSPITAL_COMMUNITY): Payer: Self-pay | Admitting: Ophthalmology

## 2016-07-27 NOTE — Addendum Note (Signed)
Addendum  created 07/27/16 1100 by Franco Noneseresa S Alfrieda Tarry, CRNA   Charge Capture section accepted

## 2016-09-28 DIAGNOSIS — Z6829 Body mass index (BMI) 29.0-29.9, adult: Secondary | ICD-10-CM | POA: Diagnosis not present

## 2016-09-28 DIAGNOSIS — G894 Chronic pain syndrome: Secondary | ICD-10-CM | POA: Diagnosis not present

## 2016-09-28 DIAGNOSIS — M5417 Radiculopathy, lumbosacral region: Secondary | ICD-10-CM | POA: Diagnosis not present

## 2016-09-28 DIAGNOSIS — E063 Autoimmune thyroiditis: Secondary | ICD-10-CM | POA: Diagnosis not present

## 2016-09-28 DIAGNOSIS — R69 Illness, unspecified: Secondary | ICD-10-CM | POA: Diagnosis not present

## 2016-10-06 ENCOUNTER — Other Ambulatory Visit (HOSPITAL_COMMUNITY): Payer: Self-pay | Admitting: Internal Medicine

## 2016-10-06 DIAGNOSIS — Z1231 Encounter for screening mammogram for malignant neoplasm of breast: Secondary | ICD-10-CM

## 2016-11-16 DIAGNOSIS — H1131 Conjunctival hemorrhage, right eye: Secondary | ICD-10-CM | POA: Diagnosis not present

## 2017-01-02 DIAGNOSIS — Z1211 Encounter for screening for malignant neoplasm of colon: Secondary | ICD-10-CM | POA: Diagnosis not present

## 2017-01-10 DIAGNOSIS — Z961 Presence of intraocular lens: Secondary | ICD-10-CM | POA: Diagnosis not present

## 2017-01-12 DIAGNOSIS — R69 Illness, unspecified: Secondary | ICD-10-CM | POA: Diagnosis not present

## 2017-01-12 DIAGNOSIS — G894 Chronic pain syndrome: Secondary | ICD-10-CM | POA: Diagnosis not present

## 2017-01-12 DIAGNOSIS — R5383 Other fatigue: Secondary | ICD-10-CM | POA: Diagnosis not present

## 2017-01-12 DIAGNOSIS — G4733 Obstructive sleep apnea (adult) (pediatric): Secondary | ICD-10-CM | POA: Diagnosis not present

## 2017-01-12 DIAGNOSIS — Z6829 Body mass index (BMI) 29.0-29.9, adult: Secondary | ICD-10-CM | POA: Diagnosis not present

## 2017-04-28 DIAGNOSIS — G894 Chronic pain syndrome: Secondary | ICD-10-CM | POA: Diagnosis not present

## 2017-04-28 DIAGNOSIS — R69 Illness, unspecified: Secondary | ICD-10-CM | POA: Diagnosis not present

## 2017-04-28 DIAGNOSIS — J449 Chronic obstructive pulmonary disease, unspecified: Secondary | ICD-10-CM | POA: Diagnosis not present

## 2017-04-28 DIAGNOSIS — E6609 Other obesity due to excess calories: Secondary | ICD-10-CM | POA: Diagnosis not present

## 2017-04-28 DIAGNOSIS — M1991 Primary osteoarthritis, unspecified site: Secondary | ICD-10-CM | POA: Diagnosis not present

## 2017-04-28 DIAGNOSIS — Z6830 Body mass index (BMI) 30.0-30.9, adult: Secondary | ICD-10-CM | POA: Diagnosis not present

## 2017-04-28 DIAGNOSIS — E669 Obesity, unspecified: Secondary | ICD-10-CM | POA: Diagnosis not present

## 2017-04-28 DIAGNOSIS — N390 Urinary tract infection, site not specified: Secondary | ICD-10-CM | POA: Diagnosis not present

## 2017-05-09 DIAGNOSIS — R69 Illness, unspecified: Secondary | ICD-10-CM | POA: Diagnosis not present

## 2017-07-18 DIAGNOSIS — M810 Age-related osteoporosis without current pathological fracture: Secondary | ICD-10-CM | POA: Insufficient documentation

## 2017-07-18 DIAGNOSIS — E785 Hyperlipidemia, unspecified: Secondary | ICD-10-CM | POA: Insufficient documentation

## 2017-08-22 DIAGNOSIS — M1991 Primary osteoarthritis, unspecified site: Secondary | ICD-10-CM | POA: Diagnosis not present

## 2017-08-22 DIAGNOSIS — Z6831 Body mass index (BMI) 31.0-31.9, adult: Secondary | ICD-10-CM | POA: Diagnosis not present

## 2017-08-22 DIAGNOSIS — Z1389 Encounter for screening for other disorder: Secondary | ICD-10-CM | POA: Diagnosis not present

## 2017-08-22 DIAGNOSIS — G894 Chronic pain syndrome: Secondary | ICD-10-CM | POA: Diagnosis not present

## 2017-08-22 DIAGNOSIS — E6609 Other obesity due to excess calories: Secondary | ICD-10-CM | POA: Diagnosis not present

## 2017-08-22 DIAGNOSIS — E669 Obesity, unspecified: Secondary | ICD-10-CM | POA: Diagnosis not present

## 2017-08-22 DIAGNOSIS — E063 Autoimmune thyroiditis: Secondary | ICD-10-CM | POA: Diagnosis not present

## 2017-08-22 DIAGNOSIS — R55 Syncope and collapse: Secondary | ICD-10-CM | POA: Diagnosis not present

## 2017-08-22 DIAGNOSIS — I951 Orthostatic hypotension: Secondary | ICD-10-CM | POA: Diagnosis not present

## 2017-08-22 DIAGNOSIS — J449 Chronic obstructive pulmonary disease, unspecified: Secondary | ICD-10-CM | POA: Diagnosis not present

## 2017-09-05 DIAGNOSIS — Z96653 Presence of artificial knee joint, bilateral: Secondary | ICD-10-CM | POA: Diagnosis not present

## 2017-12-04 DIAGNOSIS — E039 Hypothyroidism, unspecified: Secondary | ICD-10-CM | POA: Diagnosis not present

## 2017-12-04 DIAGNOSIS — E785 Hyperlipidemia, unspecified: Secondary | ICD-10-CM | POA: Diagnosis not present

## 2017-12-04 DIAGNOSIS — I499 Cardiac arrhythmia, unspecified: Secondary | ICD-10-CM | POA: Diagnosis not present

## 2017-12-04 DIAGNOSIS — G8929 Other chronic pain: Secondary | ICD-10-CM | POA: Diagnosis not present

## 2017-12-04 DIAGNOSIS — R609 Edema, unspecified: Secondary | ICD-10-CM | POA: Diagnosis not present

## 2017-12-04 DIAGNOSIS — G4700 Insomnia, unspecified: Secondary | ICD-10-CM | POA: Diagnosis not present

## 2017-12-04 DIAGNOSIS — R269 Unspecified abnormalities of gait and mobility: Secondary | ICD-10-CM | POA: Diagnosis not present

## 2017-12-04 DIAGNOSIS — R69 Illness, unspecified: Secondary | ICD-10-CM | POA: Diagnosis not present

## 2017-12-04 DIAGNOSIS — K08109 Complete loss of teeth, unspecified cause, unspecified class: Secondary | ICD-10-CM | POA: Diagnosis not present

## 2017-12-05 DIAGNOSIS — E6609 Other obesity due to excess calories: Secondary | ICD-10-CM | POA: Diagnosis not present

## 2017-12-05 DIAGNOSIS — Z6831 Body mass index (BMI) 31.0-31.9, adult: Secondary | ICD-10-CM | POA: Diagnosis not present

## 2017-12-05 DIAGNOSIS — M1991 Primary osteoarthritis, unspecified site: Secondary | ICD-10-CM | POA: Diagnosis not present

## 2017-12-05 DIAGNOSIS — J449 Chronic obstructive pulmonary disease, unspecified: Secondary | ICD-10-CM | POA: Diagnosis not present

## 2017-12-05 DIAGNOSIS — E063 Autoimmune thyroiditis: Secondary | ICD-10-CM | POA: Diagnosis not present

## 2018-02-16 DIAGNOSIS — M1991 Primary osteoarthritis, unspecified site: Secondary | ICD-10-CM | POA: Diagnosis not present

## 2018-02-16 DIAGNOSIS — J449 Chronic obstructive pulmonary disease, unspecified: Secondary | ICD-10-CM | POA: Diagnosis not present

## 2018-02-16 DIAGNOSIS — Z0001 Encounter for general adult medical examination with abnormal findings: Secondary | ICD-10-CM | POA: Diagnosis not present

## 2018-02-16 DIAGNOSIS — G894 Chronic pain syndrome: Secondary | ICD-10-CM | POA: Diagnosis not present

## 2018-02-16 DIAGNOSIS — Z6832 Body mass index (BMI) 32.0-32.9, adult: Secondary | ICD-10-CM | POA: Diagnosis not present

## 2018-02-16 DIAGNOSIS — R69 Illness, unspecified: Secondary | ICD-10-CM | POA: Diagnosis not present

## 2018-02-16 DIAGNOSIS — Z1389 Encounter for screening for other disorder: Secondary | ICD-10-CM | POA: Diagnosis not present

## 2018-02-16 DIAGNOSIS — E669 Obesity, unspecified: Secondary | ICD-10-CM | POA: Diagnosis not present

## 2018-03-21 DIAGNOSIS — Z6833 Body mass index (BMI) 33.0-33.9, adult: Secondary | ICD-10-CM | POA: Diagnosis not present

## 2018-03-21 DIAGNOSIS — E063 Autoimmune thyroiditis: Secondary | ICD-10-CM | POA: Diagnosis not present

## 2018-03-21 DIAGNOSIS — M810 Age-related osteoporosis without current pathological fracture: Secondary | ICD-10-CM | POA: Diagnosis not present

## 2018-03-21 DIAGNOSIS — R600 Localized edema: Secondary | ICD-10-CM | POA: Diagnosis not present

## 2018-03-21 DIAGNOSIS — I872 Venous insufficiency (chronic) (peripheral): Secondary | ICD-10-CM | POA: Diagnosis not present

## 2018-03-21 DIAGNOSIS — E669 Obesity, unspecified: Secondary | ICD-10-CM | POA: Diagnosis not present

## 2018-03-28 ENCOUNTER — Other Ambulatory Visit (HOSPITAL_COMMUNITY): Payer: Self-pay | Admitting: Internal Medicine

## 2018-03-28 DIAGNOSIS — Z1231 Encounter for screening mammogram for malignant neoplasm of breast: Secondary | ICD-10-CM

## 2018-04-03 ENCOUNTER — Emergency Department (HOSPITAL_COMMUNITY): Payer: Medicare HMO

## 2018-04-03 ENCOUNTER — Encounter (HOSPITAL_COMMUNITY): Payer: Self-pay | Admitting: *Deleted

## 2018-04-03 ENCOUNTER — Emergency Department (HOSPITAL_COMMUNITY)
Admission: EM | Admit: 2018-04-03 | Discharge: 2018-04-03 | Disposition: A | Discharge status: Home / Self Care | Payer: Medicare HMO | Attending: Emergency Medicine | Admitting: Emergency Medicine

## 2018-04-03 ENCOUNTER — Other Ambulatory Visit: Payer: Self-pay

## 2018-04-03 DIAGNOSIS — Z96651 Presence of right artificial knee joint: Secondary | ICD-10-CM | POA: Insufficient documentation

## 2018-04-03 DIAGNOSIS — I10 Essential (primary) hypertension: Secondary | ICD-10-CM | POA: Insufficient documentation

## 2018-04-03 DIAGNOSIS — S299XXA Unspecified injury of thorax, initial encounter: Secondary | ICD-10-CM | POA: Diagnosis not present

## 2018-04-03 DIAGNOSIS — Y92000 Kitchen of unspecified non-institutional (private) residence as  the place of occurrence of the external cause: Secondary | ICD-10-CM | POA: Insufficient documentation

## 2018-04-03 DIAGNOSIS — Y92009 Unspecified place in unspecified non-institutional (private) residence as the place of occurrence of the external cause: Secondary | ICD-10-CM

## 2018-04-03 DIAGNOSIS — S20212A Contusion of left front wall of thorax, initial encounter: Secondary | ICD-10-CM | POA: Insufficient documentation

## 2018-04-03 DIAGNOSIS — S301XXA Contusion of abdominal wall, initial encounter: Secondary | ICD-10-CM | POA: Diagnosis not present

## 2018-04-03 DIAGNOSIS — R52 Pain, unspecified: Secondary | ICD-10-CM | POA: Diagnosis not present

## 2018-04-03 DIAGNOSIS — W0110XA Fall on same level from slipping, tripping and stumbling with subsequent striking against unspecified object, initial encounter: Secondary | ICD-10-CM | POA: Insufficient documentation

## 2018-04-03 DIAGNOSIS — W19XXXA Unspecified fall, initial encounter: Secondary | ICD-10-CM

## 2018-04-03 DIAGNOSIS — Y9389 Activity, other specified: Secondary | ICD-10-CM | POA: Diagnosis not present

## 2018-04-03 DIAGNOSIS — R109 Unspecified abdominal pain: Secondary | ICD-10-CM | POA: Diagnosis not present

## 2018-04-03 DIAGNOSIS — E039 Hypothyroidism, unspecified: Secondary | ICD-10-CM | POA: Insufficient documentation

## 2018-04-03 DIAGNOSIS — S0121XA Laceration without foreign body of nose, initial encounter: Secondary | ICD-10-CM | POA: Diagnosis not present

## 2018-04-03 DIAGNOSIS — S3991XA Unspecified injury of abdomen, initial encounter: Secondary | ICD-10-CM | POA: Diagnosis not present

## 2018-04-03 DIAGNOSIS — Y999 Unspecified external cause status: Secondary | ICD-10-CM | POA: Insufficient documentation

## 2018-04-03 LAB — COMPREHENSIVE METABOLIC PANEL
ALK PHOS: 60 U/L (ref 38–126)
ALT: 22 U/L (ref 0–44)
AST: 21 U/L (ref 15–41)
Albumin: 4.1 g/dL (ref 3.5–5.0)
Anion gap: 8 (ref 5–15)
BUN: 27 mg/dL — ABNORMAL HIGH (ref 8–23)
CALCIUM: 9.2 mg/dL (ref 8.9–10.3)
CHLORIDE: 105 mmol/L (ref 98–111)
CO2: 28 mmol/L (ref 22–32)
CREATININE: 1.91 mg/dL — AB (ref 0.44–1.00)
GFR calc non Af Amer: 25 mL/min — ABNORMAL LOW (ref >60)
GFR, EST AFRICAN AMERICAN: 29 mL/min — AB (ref >60)
Glucose, Bld: 97 mg/dL (ref 70–99)
Potassium: 4 mmol/L (ref 3.5–5.1)
SODIUM: 141 mmol/L (ref 135–145)
Total Bilirubin: 0.7 mg/dL (ref 0.3–1.2)
Total Protein: 7.4 g/dL (ref 6.5–8.1)

## 2018-04-03 LAB — CBC WITH DIFFERENTIAL/PLATELET
BASOS PCT: 0 %
Basophils Absolute: 0 10*3/uL (ref 0.0–0.1)
EOS ABS: 0.2 10*3/uL (ref 0.0–0.7)
Eosinophils Relative: 2 %
HCT: 44.4 % (ref 36.0–46.0)
Hemoglobin: 14.5 g/dL (ref 12.0–15.0)
LYMPHS ABS: 3.2 10*3/uL (ref 0.7–4.0)
Lymphocytes Relative: 32 %
MCH: 31.9 pg (ref 26.0–34.0)
MCHC: 32.7 g/dL (ref 30.0–36.0)
MCV: 97.8 fL (ref 78.0–100.0)
MONO ABS: 0.6 10*3/uL (ref 0.1–1.0)
MONOS PCT: 6 %
NEUTROS PCT: 60 %
Neutro Abs: 5.9 10*3/uL (ref 1.7–7.7)
PLATELETS: 222 10*3/uL (ref 150–400)
RBC: 4.54 MIL/uL (ref 3.87–5.11)
RDW: 14.5 % (ref 11.5–15.5)
WBC: 9.9 10*3/uL (ref 4.0–10.5)

## 2018-04-03 NOTE — ED Provider Notes (Signed)
[facility] EMERGENCY DEPARTMENT Provider Note   CSN: [id number] Arrival date & time: 04/03/18  0254  Time seen 03:10 AM   History   Chief Complaint Chief Complaint  Patient presents with  . Fall    HPI Amanda Armstrong is a 72 y.o. female.  HPI patient states about 2:30 PM on September 16 she was trying to get a cup out of the refrigerator and 1 of her feet got in front of the others and she tripped and fell landing on her left side.  She states she hit her nose on something but does not know why and she had a mild laceration.  However she denies any facial pain or headache or neck pain.  What she does complain of his pain in her left ribs and left abdomen.  She denies nausea, vomiting, blurred vision, nasal bleeding, coughing, or headache.  She states she did start feeling short of breath after the fall.  She states any kind of movement or deep breathing makes the pain worse, laying still makes it feel better.  PCP [name], MD   Past Medical History:  Diagnosis Date  . Anxiety   . Arthritis    Bilateral Knee DJD  . Chronic pain   . Clostridium difficile carrier    pt stated that she has intermittent sx  . Clostridium difficile infection   . Headache(784.0)    SINUS HEADACHE OCCASSIONALLY  . Hypertension   . Hypothyroidism   . Multifactorial gait disorder   . Osteoporosis   . Vertigo     Patient Active Problem List   Diagnosis Date Noted  . UTI (urinary tract infection) 01/29/2016  . Nicotine dependence 09/07/2015  . Enteritis due to Clostridium difficile 08/29/2013  . Weakness 08/02/2013  . Diarrhea 08/02/2013  . Acute pyelonephritis 05/28/2013  . UTI (lower urinary tract infection) 05/27/2013  . Tachypnea 05/27/2013  . Hyperglycemia 05/27/2013  . Arthritis   . Anxiety   . Hypothyroidism     Past Surgical History:  Procedure Laterality Date  . CATARACT EXTRACTION W/PHACO Right 06/28/2016   Procedure: CATARACT EXTRACTION PHACO AND INTRAOCULAR LENS  PLACEMENT (IOC);  Surgeon: Jethro Bolus, MD;  Location: [facility];  Service: Ophthalmology;  Laterality: Right;  CDE: 11.10  . CATARACT EXTRACTION W/PHACO Left 07/26/2016   Procedure: CATARACT EXTRACTION PHACO AND INTRAOCULAR LENS PLACEMENT (IOC);  Surgeon: Jethro Bolus, MD;  Location: [facility];  Service: Ophthalmology;  Laterality: Left;  CDE: 8.88  . JOINT REPLACEMENT  11/08/3010   right total knee  . TOTAL KNEE ARTHROPLASTY  11/07/2011   Procedure: TOTAL KNEE ARTHROPLASTY;  Surgeon: Nilda Simmer, MD;  Location: [facility] OR;  Service: Orthopedics;  Laterality: Left;  DR Thurston Hole WANTS 90 MINUTES FOR THIS CASE  . TOTAL KNEE ARTHROPLASTY Right 2011  . TUBAL LIGATION  1982     OB History   None      Home Medications    Prior to Admission medications   Medication Sig Start Date End Date Taking? Authorizing Provider  alendronate (FOSAMAX) 70 MG tablet Take 70 mg by mouth every Sunday.  04/05/13   [provider]  ALPRAZolam Prudy Feeler) 1 MG tablet Take 1 tablet (1 mg total) by mouth 3 (three) times daily. Patient taking differently: Take 1 mg by mouth at bedtime.  02/01/16   Erick Blinks, MD  aspirin EC 81 MG tablet Take 81 mg by mouth daily.    [provider]  atorvastatin (LIPITOR) 20 MG tablet Take 20  mg by mouth at bedtime. 09/14/15   [provider]  cephALEXin (KEFLEX) 500 MG capsule Take 1 capsule (500 mg total) by mouth 4 (four) times daily. Patient not taking: Reported on 04/13/2016 04/03/16   Vanetta Mulders, MD  ciprofloxacin (CIPRO) 500 MG tablet Take 1 tablet (500 mg total) by mouth 2 (two) times daily. Patient not taking: Reported on 04/03/2016 02/01/16   Erick Blinks, MD  CRANBERRY EXTRACT PO Take 1 capsule by mouth daily.    [provider]  Difluprednate 0.05 % EMUL Place 1 drop into the right eye 2 (two) times daily. 06/21/16   [provider]  furosemide (LASIX) 20 MG tablet Take 1 tablet (20 mg total) by mouth daily. Resume in 3  days Patient taking differently: Take 20 mg by mouth daily.  02/01/16   Erick Blinks, MD  HYDROcodone-acetaminophen (NORCO) 10-325 MG tablet Take 1 tablet by mouth every 6 (six) hours as needed for pain. 06/21/16   [provider]  HYDROcodone-acetaminophen (NORCO/VICODIN) 5-325 MG tablet Take 1-2 tablets by mouth every 6 (six) hours as needed. Patient not taking: Reported on 06/21/2016 04/03/16   Vanetta Mulders, MD  levothyroxine (SYNTHROID, LEVOTHROID) 75 MCG tablet Take 75 mcg by mouth daily. 08/29/15   [provider]  meclizine (ANTIVERT) 25 MG tablet Take 1 tablet (25 mg total) by mouth 3 (three) times daily as needed for dizziness. 04/13/16   Lavera Guise, MD  naproxen sodium (ANAPROX) 220 MG tablet Take 440 mg by mouth 2 (two) times daily as needed (pain).    [provider]  ofloxacin (OCUFLOX) 0.3 % ophthalmic solution Place 1 drop into the right eye 2 (two) times daily. 06/21/16   [provider]  Probiotic Product (PROBIOTIC PO) Take 1 capsule by mouth daily.    [provider]  sulfamethoxazole-trimethoprim (BACTRIM,SEPTRA) 400-80 MG tablet Take 1 tablet by mouth daily. 05/25/16   [provider]  vitamin B-12 (CYANOCOBALAMIN) 500 MCG tablet Take 500 mcg by mouth daily.    [provider]    Family History Family History  Problem Relation Age of Onset  . Heart attack Mother   . COPD Father   . Arthritis Sister   . Heart disease Brother     Social History Social History   Tobacco Use  . Smoking status: Current Every Day Smoker    Packs/day: 0.50    Years: 48.00    Pack years: 24.00    Types: Cigarettes    Last attempt to quit: 06/15/2012    Years since quitting: 5.8  . Smokeless tobacco: Never Used  . Tobacco comment: 12/29/14 1/2 PPD  Substance Use Topics  . Alcohol use: No    Alcohol/week: 0.0 standard drinks  . Drug use: No  lives at home Uses a walker and cane Lives with spouse   Allergies    Iron; Oxycodone; and Sudafed [pseudoephedrine hcl]   Review of Systems Review of Systems  All other systems reviewed and are negative.    Physical Exam Updated Vital Signs BP 115/74 (BP Location: Left Arm)   Pulse 66   Temp 97.7 F (36.5 C) (Oral)   Resp 18   Ht 4\' 10"  (1.473 m)   Wt 66.7 kg   SpO2 97%   BMI 30.73 kg/m   Physical Exam  Constitutional: She is oriented to person, place, and time. She appears well-developed and well-nourished.  Non-toxic appearance. She does not appear ill. No distress.  HENT:  Head: Normocephalic.  Right Ear: External ear normal.  Left Ear: External ear normal.  Nose: Nose normal. No mucosal edema or rhinorrhea.  Mouth/Throat: Oropharynx is clear and moist and mucous membranes are normal. No dental abscesses or uvula swelling.  Patient has a superficial laceration approximately 1 cm on the bridge of her nose that is well approximated and already healing.  There is no active bleeding.  Eyes: Pupils are equal, round, and reactive to light. Conjunctivae and EOM are normal.  Neck: Normal range of motion and full passive range of motion without pain. Neck supple.  Cardiovascular: Normal rate, regular rhythm and normal heart sounds. Exam reveals no gallop and no friction rub.  No murmur heard. Pulmonary/Chest: Effort normal and breath sounds normal. No respiratory distress. She has no wheezes. She has no rhonchi. She has no rales. She exhibits tenderness. She exhibits no crepitus.    Abdominal: Soft. Normal appearance and bowel sounds are normal. She exhibits no distension. There is tenderness. There is no rebound and no guarding.    Musculoskeletal: Normal range of motion. She exhibits no edema or tenderness.  Moves all extremities well.  She has absolutely no pain on flexion of her knee or hip.  Neurological: She is alert and oriented to person, place, and time. She has normal strength. No cranial nerve deficit.  Skin: Skin is warm, dry and  intact. No rash noted. No erythema. No pallor.  Psychiatric: She has a normal mood and affect. Her speech is normal and behavior is normal. Her mood appears not anxious.  Nursing note and vitals reviewed.    ED Treatments / Results  Labs (all labs ordered are listed, but only abnormal results are displayed) Results for orders placed or performed during the hospital encounter of 04/03/18  Comprehensive metabolic panel  Result Value Ref Range   Sodium 141 135 - 145 mmol/L   Potassium 4.0 3.5 - 5.1 mmol/L   Chloride 105 98 - 111 mmol/L   CO2 28 22 - 32 mmol/L   Glucose, Bld 97 70 - 99 mg/dL   BUN 27 (H) 8 - 23 mg/dL   Creatinine, Ser 1.611.91 (H) 0.44 - 1.00 mg/dL   Calcium 9.2 8.9 - 09.610.3 mg/dL   Total Protein 7.4 6.5 - 8.1 g/dL   Albumin 4.1 3.5 - 5.0 g/dL   AST 21 15 - 41 U/L   ALT 22 0 - 44 U/L   Alkaline Phosphatase 60 38 - 126 U/L   Total Bilirubin 0.7 0.3 - 1.2 mg/dL   GFR calc non Af Amer 25 (L) >60 mL/min   GFR calc Af Amer 29 (L) >60 mL/min   Anion gap 8 5 - 15  CBC with Differential  Result Value Ref Range   WBC 9.9 4.0 - 10.5 K/uL   RBC 4.54 3.87 - 5.11 MIL/uL   Hemoglobin 14.5 12.0 - 15.0 g/dL   HCT 04.544.4 40.936.0 - 81.146.0 %   MCV 97.8 78.0 - 100.0 fL   MCH 31.9 26.0 - 34.0 pg   MCHC 32.7 30.0 - 36.0 g/dL   RDW 91.414.5 78.211.5 - 95.615.5 %   Platelets 222 150 - 400 K/uL   Neutrophils Relative % 60 %   Neutro Abs 5.9 1.7 - 7.7 K/uL   Lymphocytes Relative 32 %   Lymphs Abs 3.2 0.7 - 4.0 K/uL   Monocytes Relative 6 %   Monocytes Absolute 0.6 0.1 - 1.0 K/uL   Eosinophils Relative 2 %   Eosinophils Absolute 0.2 0.0 - 0.7  K/uL   Basophils Relative 0 %   Basophils Absolute 0.0 0.0 - 0.1 K/uL   Laboratory interpretation all normal except chronic renal insufficiency which is been getting slowly worse over the year.    EKG None  Radiology Ct Abdomen Pelvis Wo Contrast  Result Date: 04/03/2018 CLINICAL DATA:  Trip and fall injury landing to the left side. Left flank pain. EXAM:  CT CHEST, ABDOMEN AND PELVIS WITHOUT CONTRAST TECHNIQUE: Multidetector CT imaging of the chest, abdomen and pelvis was performed following the standard protocol without IV contrast. COMPARISON:  05/28/2013 FINDINGS: CT CHEST FINDINGS Cardiovascular: Calcification of the aorta. Normal caliber aorta. Calcification of coronary arteries. Normal heart size. No pericardial effusion. Mediastinum/Nodes: Esophagus is decompressed. No significant lymphadenopathy in the chest. No abnormal mediastinal fluid or gas collections. Lungs/Pleura: Motion artifact limits examination. Mild dependent changes in the lung bases. No airspace disease or consolidation. No blunting of costophrenic angles. No pneumothorax. Calcified granulomas in the lungs. No significant pulmonary nodules. Musculoskeletal: Degenerative changes in the spine. Mild compression of the superior endplate of T12 is new since previous study and may represent acute compression fracture. No retropulsed fracture fragments. Sternum and ribs appear intact. CT ABDOMEN PELVIS FINDINGS Hepatobiliary: No focal liver lesions. Cholelithiasis without inflammatory changes. No bile duct dilatation. Pancreas: Unremarkable. No pancreatic ductal dilatation or surrounding inflammatory changes. Spleen: No splenic injury or perisplenic hematoma. Adrenals/Urinary Tract: 1.6 cm left adrenal gland nodule with fat attenuation consistent with a benign adenoma. Kidneys are symmetrical. No hydronephrosis or hydroureter. Bladder is unremarkable. Stomach/Bowel: Stomach, small bowel, and colon are not abnormally distended. No discrete wall thickening is appreciated. Diverticulosis of the sigmoid colon without inflammatory changes. Appendix is normal. No mesenteric hematomas. Vascular/Lymphatic: Aortic atherosclerosis. No enlarged abdominal or pelvic lymph nodes. Reproductive: Uterus and ovaries are not enlarged. Calcification in the left adnexal region may represent dystrophic calcification or  possibly a calcified pedunculated fibroid. No change since previous study. Other: No free air or free fluid in the abdomen. Abdominal wall musculature appears intact. Musculoskeletal: Degenerative changes in the spine. Pelvis, sacrum, and hips appear intact. IMPRESSION: 1. No evidence of mediastinal or pulmonary parenchymal injury. No evidence of solid organ injury or bowel perforation. 2. Mild compression of the superior endplate of T12 is new since previous study and may represent acute compression fracture. 3. Cholelithiasis without evidence of cholecystitis. 4. Benign-appearing left adrenal gland adenoma. 5. Diverticulosis of the sigmoid colon without evidence of diverticulitis. Aortic Atherosclerosis (ICD10-I70.0). Electronically Signed   By: Burman Nieves M.D.   On: 04/03/2018 04:28   Ct Chest Wo Contrast  Result Date: 04/03/2018 CLINICAL DATA:  Trip and fall injury landing to the left side. Left flank pain. EXAM: CT CHEST, ABDOMEN AND PELVIS WITHOUT CONTRAST TECHNIQUE: Multidetector CT imaging of the chest, abdomen and pelvis was performed following the standard protocol without IV contrast. COMPARISON:  05/28/2013 FINDINGS: CT CHEST FINDINGS Cardiovascular: Calcification of the aorta. Normal caliber aorta. Calcification of coronary arteries. Normal heart size. No pericardial effusion. Mediastinum/Nodes: Esophagus is decompressed. No significant lymphadenopathy in the chest. No abnormal mediastinal fluid or gas collections. Lungs/Pleura: Motion artifact limits examination. Mild dependent changes in the lung bases. No airspace disease or consolidation. No blunting of costophrenic angles. No pneumothorax. Calcified granulomas in the lungs. No significant pulmonary nodules. Musculoskeletal: Degenerative changes in the spine. Mild compression of the superior endplate of T12 is new since previous study and may represent acute compression fracture. No retropulsed fracture fragments. Sternum and ribs appear  intact. CT  ABDOMEN PELVIS FINDINGS Hepatobiliary: No focal liver lesions. Cholelithiasis without inflammatory changes. No bile duct dilatation. Pancreas: Unremarkable. No pancreatic ductal dilatation or surrounding inflammatory changes. Spleen: No splenic injury or perisplenic hematoma. Adrenals/Urinary Tract: 1.6 cm left adrenal gland nodule with fat attenuation consistent with a benign adenoma. Kidneys are symmetrical. No hydronephrosis or hydroureter. Bladder is unremarkable. Stomach/Bowel: Stomach, small bowel, and colon are not abnormally distended. No discrete wall thickening is appreciated. Diverticulosis of the sigmoid colon without inflammatory changes. Appendix is normal. No mesenteric hematomas. Vascular/Lymphatic: Aortic atherosclerosis. No enlarged abdominal or pelvic lymph nodes. Reproductive: Uterus and ovaries are not enlarged. Calcification in the left adnexal region may represent dystrophic calcification or possibly a calcified pedunculated fibroid. No change since previous study. Other: No free air or free fluid in the abdomen. Abdominal wall musculature appears intact. Musculoskeletal: Degenerative changes in the spine. Pelvis, sacrum, and hips appear intact. IMPRESSION: 1. No evidence of mediastinal or pulmonary parenchymal injury. No evidence of solid organ injury or bowel perforation. 2. Mild compression of the superior endplate of T12 is new since previous study and may represent acute compression fracture. 3. Cholelithiasis without evidence of cholecystitis. 4. Benign-appearing left adrenal gland adenoma. 5. Diverticulosis of the sigmoid colon without evidence of diverticulitis. Aortic Atherosclerosis (ICD10-I70.0). Electronically Signed   By: Burman Nieves M.D.   On: 04/03/2018 04:28    Procedures Procedures (including critical care time)  Medications Ordered in ED Medications - No data to display   Initial Impression / Assessment and Plan / ED Course  I have reviewed the  triage vital signs and the nursing notes.  Pertinent labs & imaging results that were available during my care of the patient were reviewed by me and considered in my medical decision making (see chart for details).     Patient needs a CT to evaluate her abdominal pain after the fall.  However when I look at her prior blood work she has had a renal insufficiency for several years, however her last blood work was from 2017.  Her BUN was 25 and her creatinine was 1.73 therefore CT of the chest without contrast and CT of the abdomen/pelvis without contrast was done to evaluate her for injury after her fall.  I did not do a CT of her head or maxillofacial because she had absolutely no complaints in that area.  5:20 AM I talked to the patient and her multiple family members about her test results.  She denies any pain in her back, her pain is only on her side.  She asked me why it hurts and I explained to her she fell she has a bruise just like if she fell on her knee and had a bruise he would hurt when she moved or touched it.  I asked her if she is on any chronic pain medication and she states "no".  When I then looked at the [facility] patient gets #120 hydrocodone 10/325 monthly from her PCP, last filled September 3, and #90 alprazolam 1 mg tablets filled monthly by her primary care doctor, last filled September 7.  I discussed because of her chest hurting when she breathes she still needs to take big deep breath so she does not have a collapsed lung or developing pneumonia.  Final Clinical Impressions(s) / ED Diagnoses   Final diagnoses:  Fall in home, initial encounter  Contusion, chest wall, left, initial encounter  Contusion of abdominal wall, initial encounter    ED Discharge Orders  None     Plan discharge  Devoria Albe, MD, Concha Pyo, MD 04/03/18 [phone]

## 2018-04-03 NOTE — Discharge Instructions (Addendum)
Use ice for comfort over the tender areas.  You already have pain medication to take at home.  Try to take a big deep breaths every 30 minutes to prevent your lung from collapsing and developing a pneumonia.  Recheck if you get fever, cough, or struggle to breathe.

## 2018-04-03 NOTE — ED Triage Notes (Signed)
Pt brought in by [facility] for c/o fall; pt states she tripped and fell landing on her left side; pt c/o left flank pain; pt has abrasion to bridge of nose

## 2018-04-09 ENCOUNTER — Ambulatory Visit (HOSPITAL_COMMUNITY)
Admission: RE | Admit: 2018-04-09 | Discharge: 2018-04-09 | Disposition: A | Discharge status: Home / Self Care | Payer: Medicare HMO | Source: Ambulatory Visit | Attending: Internal Medicine | Admitting: Internal Medicine

## 2018-04-09 DIAGNOSIS — Z1231 Encounter for screening mammogram for malignant neoplasm of breast: Secondary | ICD-10-CM | POA: Insufficient documentation

## 2018-04-13 ENCOUNTER — Emergency Department (HOSPITAL_COMMUNITY)
Admission: EM | Admit: 2018-04-13 | Discharge: 2018-04-13 | Disposition: A | Discharge status: Home / Self Care | Payer: Medicare HMO | Attending: Emergency Medicine | Admitting: Emergency Medicine

## 2018-04-13 ENCOUNTER — Emergency Department (HOSPITAL_COMMUNITY): Payer: Medicare HMO

## 2018-04-13 ENCOUNTER — Other Ambulatory Visit: Payer: Self-pay

## 2018-04-13 ENCOUNTER — Encounter (HOSPITAL_COMMUNITY): Payer: Self-pay | Admitting: Emergency Medicine

## 2018-04-13 DIAGNOSIS — I10 Essential (primary) hypertension: Secondary | ICD-10-CM | POA: Diagnosis not present

## 2018-04-13 DIAGNOSIS — Z7982 Long term (current) use of aspirin: Secondary | ICD-10-CM | POA: Insufficient documentation

## 2018-04-13 DIAGNOSIS — W010XXA Fall on same level from slipping, tripping and stumbling without subsequent striking against object, initial encounter: Secondary | ICD-10-CM | POA: Insufficient documentation

## 2018-04-13 DIAGNOSIS — E039 Hypothyroidism, unspecified: Secondary | ICD-10-CM | POA: Insufficient documentation

## 2018-04-13 DIAGNOSIS — R0781 Pleurodynia: Secondary | ICD-10-CM | POA: Insufficient documentation

## 2018-04-13 DIAGNOSIS — Y939 Activity, unspecified: Secondary | ICD-10-CM | POA: Diagnosis not present

## 2018-04-13 DIAGNOSIS — Y999 Unspecified external cause status: Secondary | ICD-10-CM | POA: Insufficient documentation

## 2018-04-13 DIAGNOSIS — Z79899 Other long term (current) drug therapy: Secondary | ICD-10-CM | POA: Insufficient documentation

## 2018-04-13 DIAGNOSIS — R0600 Dyspnea, unspecified: Secondary | ICD-10-CM | POA: Diagnosis not present

## 2018-04-13 DIAGNOSIS — F17210 Nicotine dependence, cigarettes, uncomplicated: Secondary | ICD-10-CM | POA: Diagnosis not present

## 2018-04-13 DIAGNOSIS — R69 Illness, unspecified: Secondary | ICD-10-CM | POA: Diagnosis not present

## 2018-04-13 DIAGNOSIS — R509 Fever, unspecified: Secondary | ICD-10-CM | POA: Diagnosis not present

## 2018-04-13 DIAGNOSIS — Z96652 Presence of left artificial knee joint: Secondary | ICD-10-CM | POA: Diagnosis not present

## 2018-04-13 DIAGNOSIS — Y929 Unspecified place or not applicable: Secondary | ICD-10-CM | POA: Diagnosis not present

## 2018-04-13 NOTE — ED Provider Notes (Signed)
[facility] EMERGENCY DEPARTMENT Provider Note   CSN: [id number] Arrival date & time: 04/13/18  1740     History   Chief Complaint Chief Complaint  Patient presents with  . Rib Injury    HPI Amanda Armstrong is a 72 y.o. female.  Patient status post a fall on September 17.  She is brought in by EMS.  Patient at that time tripped and fell landing on her left side which had a complaint of left flank pain and some left lateral chest pain.  Had abrasion to her nose.  At that time she had CT of her chest and CT of her abdomen with contrast for both in no acute findings.  Patient returns today with persistent pain in the left lateral lower rib area.  No fevers no shortness of breath.  Patient does have hydrocodone to take at home but she has not been taking it regularly.  Patient states that she is got persistent pain is hard for her to get around.  Oxygen saturation on room air on arrival here was 100% no fevers.     Past Medical History:  Diagnosis Date  . Anxiety   . Arthritis    Bilateral Knee DJD  . Chronic pain   . Clostridium difficile carrier    pt stated that she has intermittent sx  . Clostridium difficile infection   . Headache(784.0)    SINUS HEADACHE OCCASSIONALLY  . Hypertension   . Hypothyroidism   . Multifactorial gait disorder   . Osteoporosis   . Vertigo     Patient Active Problem List   Diagnosis Date Noted  . UTI (urinary tract infection) 01/29/2016  . Nicotine dependence 09/07/2015  . Enteritis due to Clostridium difficile 08/29/2013  . Weakness 08/02/2013  . Diarrhea 08/02/2013  . Acute pyelonephritis 05/28/2013  . UTI (lower urinary tract infection) 05/27/2013  . Tachypnea 05/27/2013  . Hyperglycemia 05/27/2013  . Arthritis   . Anxiety   . Hypothyroidism     Past Surgical History:  Procedure Laterality Date  . CATARACT EXTRACTION W/PHACO Right 06/28/2016   Procedure: CATARACT EXTRACTION PHACO AND INTRAOCULAR LENS PLACEMENT (IOC);  Surgeon:  Jethro Bolus, MD;  Location: [facility];  Service: Ophthalmology;  Laterality: Right;  CDE: 11.10  . CATARACT EXTRACTION W/PHACO Left 07/26/2016   Procedure: CATARACT EXTRACTION PHACO AND INTRAOCULAR LENS PLACEMENT (IOC);  Surgeon: Jethro Bolus, MD;  Location: [facility];  Service: Ophthalmology;  Laterality: Left;  CDE: 8.88  . JOINT REPLACEMENT  11/08/3010   right total knee  . TOTAL KNEE ARTHROPLASTY  11/07/2011   Procedure: TOTAL KNEE ARTHROPLASTY;  Surgeon: Nilda Simmer, MD;  Location: [facility];  Service: Orthopedics;  Laterality: Left;  DR Thurston Hole WANTS 90 MINUTES FOR THIS CASE  . TOTAL KNEE ARTHROPLASTY Right 2011  . TUBAL LIGATION  1982     OB History   None      Home Medications    Prior to Admission medications   Medication Sig Start Date End Date Taking? Authorizing Provider  alendronate (FOSAMAX) 70 MG tablet Take 70 mg by mouth every Sunday.  04/05/13   [provider]  ALPRAZolam Prudy Feeler) 1 MG tablet Take 1 tablet (1 mg total) by mouth 3 (three) times daily. Patient taking differently: Take 1 mg by mouth at bedtime.  02/01/16   Erick Blinks, MD  aspirin EC 81 MG tablet Take 81 mg by mouth daily.    [provider]  atorvastatin (LIPITOR) 20 MG tablet Take 20  mg by mouth at bedtime. 09/14/15   [provider]  cephALEXin (KEFLEX) 500 MG capsule Take 1 capsule (500 mg total) by mouth 4 (four) times daily. Patient not taking: Reported on 04/13/2016 04/03/16   Vanetta Mulders, MD  ciprofloxacin (CIPRO) 500 MG tablet Take 1 tablet (500 mg total) by mouth 2 (two) times daily. Patient not taking: Reported on 04/03/2016 02/01/16   Erick Blinks, MD  CRANBERRY EXTRACT PO Take 1 capsule by mouth daily.    [provider]  Difluprednate 0.05 % EMUL Place 1 drop into the right eye 2 (two) times daily. 06/21/16   [provider]  furosemide (LASIX) 20 MG tablet Take 1 tablet (20 mg total) by mouth daily. Resume in 3 days Patient taking differently: Take  20 mg by mouth daily.  02/01/16   Erick Blinks, MD  HYDROcodone-acetaminophen (NORCO) 10-325 MG tablet Take 1 tablet by mouth every 6 (six) hours as needed for pain. 06/21/16   [provider]  HYDROcodone-acetaminophen (NORCO/VICODIN) 5-325 MG tablet Take 1-2 tablets by mouth every 6 (six) hours as needed. Patient not taking: Reported on 06/21/2016 04/03/16   Vanetta Mulders, MD  levothyroxine (SYNTHROID, LEVOTHROID) 75 MCG tablet Take 75 mcg by mouth daily. 08/29/15   [provider]  meclizine (ANTIVERT) 25 MG tablet Take 1 tablet (25 mg total) by mouth 3 (three) times daily as needed for dizziness. 04/13/16   Lavera Guise, MD  naproxen sodium (ANAPROX) 220 MG tablet Take 440 mg by mouth 2 (two) times daily as needed (pain).    [provider]  ofloxacin (OCUFLOX) 0.3 % ophthalmic solution Place 1 drop into the right eye 2 (two) times daily. 06/21/16   [provider]  Probiotic Product (PROBIOTIC PO) Take 1 capsule by mouth daily.    [provider]  sulfamethoxazole-trimethoprim (BACTRIM,SEPTRA) 400-80 MG tablet Take 1 tablet by mouth daily. 05/25/16   [provider]  vitamin B-12 (CYANOCOBALAMIN) 500 MCG tablet Take 500 mcg by mouth daily.    [provider]    Family History Family History  Problem Relation Age of Onset  . Heart attack Mother   . COPD Father   . Arthritis Sister   . Heart disease Brother     Social History Social History   Tobacco Use  . Smoking status: Current Every Day Smoker    Packs/day: 0.50    Years: 48.00    Pack years: 24.00    Types: Cigarettes    Last attempt to quit: 06/15/2012    Years since quitting: 5.8  . Smokeless tobacco: Never Used  . Tobacco comment: 12/29/14 1/2 PPD  Substance Use Topics  . Alcohol use: No    Alcohol/week: 0.0 standard drinks  . Drug use: No     Allergies   Iron; Oxycodone; and Sudafed [pseudoephedrine hcl]   Review of Systems Review of Systems    Constitutional: Positive for fever.  HENT: Negative for congestion.   Eyes: Negative for redness.  Respiratory: Negative for shortness of breath.   Cardiovascular: Positive for chest pain.  Gastrointestinal: Negative for abdominal pain.  Genitourinary: Negative for dysuria.  Musculoskeletal: Negative for back pain and neck pain.  Skin: Negative for rash.  Neurological: Negative for syncope and headaches.  Hematological: Does not bruise/bleed easily.  Psychiatric/Behavioral: Negative for confusion.     Physical Exam Updated Vital Signs BP 120/74 (BP Location: Left Arm)   Pulse 88   Temp 97.7 F (36.5 C) (Oral)   Resp  16   Ht 1.473 m (4\' 10" )   Wt 66.7 kg   SpO2 100%   BMI 30.73 kg/m   Physical Exam  Constitutional: She is oriented to person, place, and time. She appears well-developed and well-nourished. No distress.  HENT:  Head: Normocephalic and atraumatic.  Mouth/Throat: Oropharynx is clear and moist.  Eyes: Pupils are equal, round, and reactive to light. Conjunctivae and EOM are normal.  Neck: Normal range of motion. Neck supple.  Cardiovascular: Normal rate, regular rhythm and normal heart sounds.  Pulmonary/Chest: Effort normal and breath sounds normal. She exhibits tenderness.  Abdominal: Soft. Bowel sounds are normal. There is no tenderness.  Musculoskeletal: Normal range of motion. She exhibits no edema, tenderness or deformity.  Neurological: She is alert and oriented to person, place, and time. No cranial nerve deficit or sensory deficit. She exhibits normal muscle tone. Coordination normal.  Skin: Skin is warm. No rash noted.  Nursing note and vitals reviewed.    ED Treatments / Results  Labs (all labs ordered are listed, but only abnormal results are displayed) Labs Reviewed - No data to display  EKG None  Radiology Dg Chest 2 View  Result Date: 04/13/2018 CLINICAL DATA:  Dyspnea times a few days EXAM: CHEST - 2 VIEW COMPARISON:  01/29/2016  FINDINGS: The heart size and mediastinal contours are within normal limits. No acute pulmonary consolidation or CHF. No effusion or pneumothorax. The visualized skeletal structures are unremarkable. IMPRESSION: No active cardiopulmonary disease. Electronically Signed   By: Tollie Eth M.D.   On: 04/13/2018 19:43    Procedures Procedures (including critical care time)  Medications Ordered in ED Medications - No data to display   Initial Impression / Assessment and Plan / ED Course  I have reviewed the triage vital signs and the nursing notes.  Pertinent labs & imaging results that were available during my care of the patient were reviewed by me and considered in my medical decision making (see chart for details).    Patient with tenderness to palpation over the left lateral lower ribs.  No abdominal tenderness or guarding.  Think is consistent with rib contusion from the fall.  Chest x-ray was repeated here today no acute findings.  As stated before when patient originally fell and was evaluated on 17 September had CT of her chest and CT of the abdomen without any acute traumatic events.  No evidence of pneumonia here today no evidence of pneumothorax on the repeat chest x-ray.  Patient not taking pain medicine.  Talked her that is important she take the pain medicine even if it makes her a little drowsy so that she can get around and function better otherwise she is at risk for developing pneumonia.   Final Clinical Impressions(s) / ED Diagnoses   Final diagnoses:  Rib pain on left side    ED Discharge Orders    None       Vanetta Mulders, MD 04/13/18 2137

## 2018-04-13 NOTE — Discharge Instructions (Addendum)
Take your pain medication as directed.  Make an appointment to follow-up with your doctor.  Return for any new or worse symptoms.  Return for any concerns for pneumonia increased shortness of breath or fevers.  When you were seen on the 17th they did a CT of your chest and CT of your abdomen without any acute traumatic findings.

## 2018-04-13 NOTE — ED Triage Notes (Signed)
Pt fell x 1 1/2 weeks ago.  C/o pain to lt lower side.  Pt reports it is the same pain but worse that it was last time.

## 2018-04-13 NOTE — ED Triage Notes (Signed)
Rounding in waiting room. Patient in xray. Delay explained to family.

## 2018-04-18 ENCOUNTER — Ambulatory Visit (HOSPITAL_COMMUNITY): Payer: Medicare HMO

## 2018-05-01 DIAGNOSIS — R42 Dizziness and giddiness: Secondary | ICD-10-CM | POA: Diagnosis not present

## 2018-05-01 DIAGNOSIS — M21371 Foot drop, right foot: Secondary | ICD-10-CM | POA: Diagnosis not present

## 2018-05-01 DIAGNOSIS — R69 Illness, unspecified: Secondary | ICD-10-CM | POA: Diagnosis not present

## 2018-05-01 DIAGNOSIS — G894 Chronic pain syndrome: Secondary | ICD-10-CM | POA: Diagnosis not present

## 2018-05-01 DIAGNOSIS — J449 Chronic obstructive pulmonary disease, unspecified: Secondary | ICD-10-CM | POA: Diagnosis not present

## 2018-05-01 DIAGNOSIS — Z681 Body mass index (BMI) 19 or less, adult: Secondary | ICD-10-CM | POA: Diagnosis not present

## 2018-05-01 DIAGNOSIS — Z23 Encounter for immunization: Secondary | ICD-10-CM | POA: Diagnosis not present

## 2018-05-01 DIAGNOSIS — I639 Cerebral infarction, unspecified: Secondary | ICD-10-CM | POA: Diagnosis not present

## 2018-05-09 DIAGNOSIS — R69 Illness, unspecified: Secondary | ICD-10-CM | POA: Diagnosis not present

## 2018-05-09 DIAGNOSIS — M21371 Foot drop, right foot: Secondary | ICD-10-CM | POA: Diagnosis not present

## 2018-05-09 DIAGNOSIS — M1990 Unspecified osteoarthritis, unspecified site: Secondary | ICD-10-CM | POA: Diagnosis not present

## 2018-05-09 DIAGNOSIS — E039 Hypothyroidism, unspecified: Secondary | ICD-10-CM | POA: Diagnosis not present

## 2018-05-09 DIAGNOSIS — M810 Age-related osteoporosis without current pathological fracture: Secondary | ICD-10-CM | POA: Diagnosis not present

## 2018-05-09 DIAGNOSIS — R42 Dizziness and giddiness: Secondary | ICD-10-CM | POA: Diagnosis not present

## 2018-05-09 DIAGNOSIS — I69398 Other sequelae of cerebral infarction: Secondary | ICD-10-CM | POA: Diagnosis not present

## 2018-05-09 DIAGNOSIS — E785 Hyperlipidemia, unspecified: Secondary | ICD-10-CM | POA: Diagnosis not present

## 2018-05-09 DIAGNOSIS — J449 Chronic obstructive pulmonary disease, unspecified: Secondary | ICD-10-CM | POA: Diagnosis not present

## 2018-05-10 DIAGNOSIS — E785 Hyperlipidemia, unspecified: Secondary | ICD-10-CM | POA: Diagnosis not present

## 2018-05-10 DIAGNOSIS — E039 Hypothyroidism, unspecified: Secondary | ICD-10-CM | POA: Diagnosis not present

## 2018-05-10 DIAGNOSIS — I69398 Other sequelae of cerebral infarction: Secondary | ICD-10-CM | POA: Diagnosis not present

## 2018-05-10 DIAGNOSIS — M1990 Unspecified osteoarthritis, unspecified site: Secondary | ICD-10-CM | POA: Diagnosis not present

## 2018-05-10 DIAGNOSIS — R42 Dizziness and giddiness: Secondary | ICD-10-CM | POA: Diagnosis not present

## 2018-05-10 DIAGNOSIS — R69 Illness, unspecified: Secondary | ICD-10-CM | POA: Diagnosis not present

## 2018-05-10 DIAGNOSIS — J449 Chronic obstructive pulmonary disease, unspecified: Secondary | ICD-10-CM | POA: Diagnosis not present

## 2018-05-10 DIAGNOSIS — M810 Age-related osteoporosis without current pathological fracture: Secondary | ICD-10-CM | POA: Diagnosis not present

## 2018-05-10 DIAGNOSIS — M21371 Foot drop, right foot: Secondary | ICD-10-CM | POA: Diagnosis not present

## 2018-05-15 DIAGNOSIS — M810 Age-related osteoporosis without current pathological fracture: Secondary | ICD-10-CM | POA: Diagnosis not present

## 2018-05-15 DIAGNOSIS — M21371 Foot drop, right foot: Secondary | ICD-10-CM | POA: Diagnosis not present

## 2018-05-15 DIAGNOSIS — R69 Illness, unspecified: Secondary | ICD-10-CM | POA: Diagnosis not present

## 2018-05-15 DIAGNOSIS — R42 Dizziness and giddiness: Secondary | ICD-10-CM | POA: Diagnosis not present

## 2018-05-15 DIAGNOSIS — E039 Hypothyroidism, unspecified: Secondary | ICD-10-CM | POA: Diagnosis not present

## 2018-05-15 DIAGNOSIS — J449 Chronic obstructive pulmonary disease, unspecified: Secondary | ICD-10-CM | POA: Diagnosis not present

## 2018-05-15 DIAGNOSIS — I69398 Other sequelae of cerebral infarction: Secondary | ICD-10-CM | POA: Diagnosis not present

## 2018-05-15 DIAGNOSIS — E785 Hyperlipidemia, unspecified: Secondary | ICD-10-CM | POA: Diagnosis not present

## 2018-05-15 DIAGNOSIS — M1990 Unspecified osteoarthritis, unspecified site: Secondary | ICD-10-CM | POA: Diagnosis not present

## 2018-05-16 DIAGNOSIS — E039 Hypothyroidism, unspecified: Secondary | ICD-10-CM | POA: Diagnosis not present

## 2018-05-16 DIAGNOSIS — I69398 Other sequelae of cerebral infarction: Secondary | ICD-10-CM | POA: Diagnosis not present

## 2018-05-16 DIAGNOSIS — E785 Hyperlipidemia, unspecified: Secondary | ICD-10-CM | POA: Diagnosis not present

## 2018-05-16 DIAGNOSIS — M810 Age-related osteoporosis without current pathological fracture: Secondary | ICD-10-CM | POA: Diagnosis not present

## 2018-05-16 DIAGNOSIS — R42 Dizziness and giddiness: Secondary | ICD-10-CM | POA: Diagnosis not present

## 2018-05-16 DIAGNOSIS — R69 Illness, unspecified: Secondary | ICD-10-CM | POA: Diagnosis not present

## 2018-05-16 DIAGNOSIS — M21371 Foot drop, right foot: Secondary | ICD-10-CM | POA: Diagnosis not present

## 2018-05-16 DIAGNOSIS — M1990 Unspecified osteoarthritis, unspecified site: Secondary | ICD-10-CM | POA: Diagnosis not present

## 2018-05-16 DIAGNOSIS — J449 Chronic obstructive pulmonary disease, unspecified: Secondary | ICD-10-CM | POA: Diagnosis not present

## 2018-05-18 DIAGNOSIS — J449 Chronic obstructive pulmonary disease, unspecified: Secondary | ICD-10-CM | POA: Diagnosis not present

## 2018-05-18 DIAGNOSIS — R69 Illness, unspecified: Secondary | ICD-10-CM | POA: Diagnosis not present

## 2018-05-18 DIAGNOSIS — M1990 Unspecified osteoarthritis, unspecified site: Secondary | ICD-10-CM | POA: Diagnosis not present

## 2018-05-18 DIAGNOSIS — I69398 Other sequelae of cerebral infarction: Secondary | ICD-10-CM | POA: Diagnosis not present

## 2018-05-18 DIAGNOSIS — E039 Hypothyroidism, unspecified: Secondary | ICD-10-CM | POA: Diagnosis not present

## 2018-05-18 DIAGNOSIS — M21371 Foot drop, right foot: Secondary | ICD-10-CM | POA: Diagnosis not present

## 2018-05-18 DIAGNOSIS — E785 Hyperlipidemia, unspecified: Secondary | ICD-10-CM | POA: Diagnosis not present

## 2018-05-18 DIAGNOSIS — M810 Age-related osteoporosis without current pathological fracture: Secondary | ICD-10-CM | POA: Diagnosis not present

## 2018-05-18 DIAGNOSIS — R42 Dizziness and giddiness: Secondary | ICD-10-CM | POA: Diagnosis not present

## 2018-05-21 DIAGNOSIS — R42 Dizziness and giddiness: Secondary | ICD-10-CM | POA: Diagnosis not present

## 2018-05-21 DIAGNOSIS — I69398 Other sequelae of cerebral infarction: Secondary | ICD-10-CM | POA: Diagnosis not present

## 2018-05-21 DIAGNOSIS — J449 Chronic obstructive pulmonary disease, unspecified: Secondary | ICD-10-CM | POA: Diagnosis not present

## 2018-05-21 DIAGNOSIS — M21371 Foot drop, right foot: Secondary | ICD-10-CM | POA: Diagnosis not present

## 2018-05-22 DIAGNOSIS — R42 Dizziness and giddiness: Secondary | ICD-10-CM | POA: Diagnosis not present

## 2018-05-22 DIAGNOSIS — J449 Chronic obstructive pulmonary disease, unspecified: Secondary | ICD-10-CM | POA: Diagnosis not present

## 2018-05-22 DIAGNOSIS — M1990 Unspecified osteoarthritis, unspecified site: Secondary | ICD-10-CM | POA: Diagnosis not present

## 2018-05-22 DIAGNOSIS — M21371 Foot drop, right foot: Secondary | ICD-10-CM | POA: Diagnosis not present

## 2018-05-22 DIAGNOSIS — I69398 Other sequelae of cerebral infarction: Secondary | ICD-10-CM | POA: Diagnosis not present

## 2018-05-22 DIAGNOSIS — E785 Hyperlipidemia, unspecified: Secondary | ICD-10-CM | POA: Diagnosis not present

## 2018-05-22 DIAGNOSIS — R69 Illness, unspecified: Secondary | ICD-10-CM | POA: Diagnosis not present

## 2018-05-22 DIAGNOSIS — M810 Age-related osteoporosis without current pathological fracture: Secondary | ICD-10-CM | POA: Diagnosis not present

## 2018-05-22 DIAGNOSIS — E039 Hypothyroidism, unspecified: Secondary | ICD-10-CM | POA: Diagnosis not present

## 2018-05-23 DIAGNOSIS — E039 Hypothyroidism, unspecified: Secondary | ICD-10-CM | POA: Diagnosis not present

## 2018-05-23 DIAGNOSIS — M21371 Foot drop, right foot: Secondary | ICD-10-CM | POA: Diagnosis not present

## 2018-05-23 DIAGNOSIS — R69 Illness, unspecified: Secondary | ICD-10-CM | POA: Diagnosis not present

## 2018-05-23 DIAGNOSIS — J449 Chronic obstructive pulmonary disease, unspecified: Secondary | ICD-10-CM | POA: Diagnosis not present

## 2018-05-23 DIAGNOSIS — M810 Age-related osteoporosis without current pathological fracture: Secondary | ICD-10-CM | POA: Diagnosis not present

## 2018-05-23 DIAGNOSIS — R42 Dizziness and giddiness: Secondary | ICD-10-CM | POA: Diagnosis not present

## 2018-05-23 DIAGNOSIS — I69398 Other sequelae of cerebral infarction: Secondary | ICD-10-CM | POA: Diagnosis not present

## 2018-05-23 DIAGNOSIS — E785 Hyperlipidemia, unspecified: Secondary | ICD-10-CM | POA: Diagnosis not present

## 2018-05-23 DIAGNOSIS — M1990 Unspecified osteoarthritis, unspecified site: Secondary | ICD-10-CM | POA: Diagnosis not present

## 2018-05-24 DIAGNOSIS — R531 Weakness: Secondary | ICD-10-CM | POA: Diagnosis not present

## 2018-05-24 DIAGNOSIS — E063 Autoimmune thyroiditis: Secondary | ICD-10-CM | POA: Diagnosis not present

## 2018-05-24 DIAGNOSIS — Z1389 Encounter for screening for other disorder: Secondary | ICD-10-CM | POA: Diagnosis not present

## 2018-05-24 DIAGNOSIS — G894 Chronic pain syndrome: Secondary | ICD-10-CM | POA: Diagnosis not present

## 2018-05-24 DIAGNOSIS — Z681 Body mass index (BMI) 19 or less, adult: Secondary | ICD-10-CM | POA: Diagnosis not present

## 2018-05-25 DIAGNOSIS — I69398 Other sequelae of cerebral infarction: Secondary | ICD-10-CM | POA: Diagnosis not present

## 2018-05-25 DIAGNOSIS — E039 Hypothyroidism, unspecified: Secondary | ICD-10-CM | POA: Diagnosis not present

## 2018-05-25 DIAGNOSIS — R42 Dizziness and giddiness: Secondary | ICD-10-CM | POA: Diagnosis not present

## 2018-05-25 DIAGNOSIS — M21371 Foot drop, right foot: Secondary | ICD-10-CM | POA: Diagnosis not present

## 2018-05-25 DIAGNOSIS — M1990 Unspecified osteoarthritis, unspecified site: Secondary | ICD-10-CM | POA: Diagnosis not present

## 2018-05-25 DIAGNOSIS — E785 Hyperlipidemia, unspecified: Secondary | ICD-10-CM | POA: Diagnosis not present

## 2018-05-25 DIAGNOSIS — J449 Chronic obstructive pulmonary disease, unspecified: Secondary | ICD-10-CM | POA: Diagnosis not present

## 2018-05-25 DIAGNOSIS — M810 Age-related osteoporosis without current pathological fracture: Secondary | ICD-10-CM | POA: Diagnosis not present

## 2018-05-25 DIAGNOSIS — R69 Illness, unspecified: Secondary | ICD-10-CM | POA: Diagnosis not present

## 2018-05-28 DIAGNOSIS — R42 Dizziness and giddiness: Secondary | ICD-10-CM | POA: Diagnosis not present

## 2018-05-28 DIAGNOSIS — R69 Illness, unspecified: Secondary | ICD-10-CM | POA: Diagnosis not present

## 2018-05-28 DIAGNOSIS — M21371 Foot drop, right foot: Secondary | ICD-10-CM | POA: Diagnosis not present

## 2018-05-28 DIAGNOSIS — I69398 Other sequelae of cerebral infarction: Secondary | ICD-10-CM | POA: Diagnosis not present

## 2018-05-28 DIAGNOSIS — E039 Hypothyroidism, unspecified: Secondary | ICD-10-CM | POA: Diagnosis not present

## 2018-05-28 DIAGNOSIS — M810 Age-related osteoporosis without current pathological fracture: Secondary | ICD-10-CM | POA: Diagnosis not present

## 2018-05-28 DIAGNOSIS — M1990 Unspecified osteoarthritis, unspecified site: Secondary | ICD-10-CM | POA: Diagnosis not present

## 2018-05-28 DIAGNOSIS — J449 Chronic obstructive pulmonary disease, unspecified: Secondary | ICD-10-CM | POA: Diagnosis not present

## 2018-05-28 DIAGNOSIS — E785 Hyperlipidemia, unspecified: Secondary | ICD-10-CM | POA: Diagnosis not present

## 2018-05-30 ENCOUNTER — Other Ambulatory Visit (HOSPITAL_COMMUNITY): Payer: Self-pay | Admitting: Family Medicine

## 2018-05-30 DIAGNOSIS — M546 Pain in thoracic spine: Secondary | ICD-10-CM | POA: Diagnosis not present

## 2018-05-30 DIAGNOSIS — R5383 Other fatigue: Secondary | ICD-10-CM | POA: Diagnosis not present

## 2018-05-30 DIAGNOSIS — M545 Low back pain: Secondary | ICD-10-CM | POA: Diagnosis not present

## 2018-05-30 DIAGNOSIS — M1991 Primary osteoarthritis, unspecified site: Secondary | ICD-10-CM | POA: Diagnosis not present

## 2018-05-30 DIAGNOSIS — Z1231 Encounter for screening mammogram for malignant neoplasm of breast: Secondary | ICD-10-CM

## 2018-05-30 DIAGNOSIS — Z681 Body mass index (BMI) 19 or less, adult: Secondary | ICD-10-CM | POA: Diagnosis not present

## 2018-05-30 DIAGNOSIS — Z79899 Other long term (current) drug therapy: Secondary | ICD-10-CM | POA: Diagnosis not present

## 2018-06-01 DIAGNOSIS — M810 Age-related osteoporosis without current pathological fracture: Secondary | ICD-10-CM | POA: Diagnosis not present

## 2018-06-01 DIAGNOSIS — R69 Illness, unspecified: Secondary | ICD-10-CM | POA: Diagnosis not present

## 2018-06-01 DIAGNOSIS — M21371 Foot drop, right foot: Secondary | ICD-10-CM | POA: Diagnosis not present

## 2018-06-01 DIAGNOSIS — E039 Hypothyroidism, unspecified: Secondary | ICD-10-CM | POA: Diagnosis not present

## 2018-06-01 DIAGNOSIS — M1990 Unspecified osteoarthritis, unspecified site: Secondary | ICD-10-CM | POA: Diagnosis not present

## 2018-06-01 DIAGNOSIS — I69398 Other sequelae of cerebral infarction: Secondary | ICD-10-CM | POA: Diagnosis not present

## 2018-06-01 DIAGNOSIS — J449 Chronic obstructive pulmonary disease, unspecified: Secondary | ICD-10-CM | POA: Diagnosis not present

## 2018-06-01 DIAGNOSIS — E785 Hyperlipidemia, unspecified: Secondary | ICD-10-CM | POA: Diagnosis not present

## 2018-06-01 DIAGNOSIS — R42 Dizziness and giddiness: Secondary | ICD-10-CM | POA: Diagnosis not present

## 2018-06-04 DIAGNOSIS — E785 Hyperlipidemia, unspecified: Secondary | ICD-10-CM | POA: Diagnosis not present

## 2018-06-04 DIAGNOSIS — J449 Chronic obstructive pulmonary disease, unspecified: Secondary | ICD-10-CM | POA: Diagnosis not present

## 2018-06-04 DIAGNOSIS — I69398 Other sequelae of cerebral infarction: Secondary | ICD-10-CM | POA: Diagnosis not present

## 2018-06-04 DIAGNOSIS — M810 Age-related osteoporosis without current pathological fracture: Secondary | ICD-10-CM | POA: Diagnosis not present

## 2018-06-04 DIAGNOSIS — M1990 Unspecified osteoarthritis, unspecified site: Secondary | ICD-10-CM | POA: Diagnosis not present

## 2018-06-04 DIAGNOSIS — R69 Illness, unspecified: Secondary | ICD-10-CM | POA: Diagnosis not present

## 2018-06-04 DIAGNOSIS — R42 Dizziness and giddiness: Secondary | ICD-10-CM | POA: Diagnosis not present

## 2018-06-04 DIAGNOSIS — E039 Hypothyroidism, unspecified: Secondary | ICD-10-CM | POA: Diagnosis not present

## 2018-06-04 DIAGNOSIS — M21371 Foot drop, right foot: Secondary | ICD-10-CM | POA: Diagnosis not present

## 2018-06-05 DIAGNOSIS — M21371 Foot drop, right foot: Secondary | ICD-10-CM | POA: Diagnosis not present

## 2018-06-05 DIAGNOSIS — E785 Hyperlipidemia, unspecified: Secondary | ICD-10-CM | POA: Diagnosis not present

## 2018-06-05 DIAGNOSIS — J449 Chronic obstructive pulmonary disease, unspecified: Secondary | ICD-10-CM | POA: Diagnosis not present

## 2018-06-05 DIAGNOSIS — I69398 Other sequelae of cerebral infarction: Secondary | ICD-10-CM | POA: Diagnosis not present

## 2018-06-05 DIAGNOSIS — M1990 Unspecified osteoarthritis, unspecified site: Secondary | ICD-10-CM | POA: Diagnosis not present

## 2018-06-05 DIAGNOSIS — E039 Hypothyroidism, unspecified: Secondary | ICD-10-CM | POA: Diagnosis not present

## 2018-06-05 DIAGNOSIS — R42 Dizziness and giddiness: Secondary | ICD-10-CM | POA: Diagnosis not present

## 2018-06-05 DIAGNOSIS — M810 Age-related osteoporosis without current pathological fracture: Secondary | ICD-10-CM | POA: Diagnosis not present

## 2018-06-05 DIAGNOSIS — R69 Illness, unspecified: Secondary | ICD-10-CM | POA: Diagnosis not present

## 2018-06-06 ENCOUNTER — Ambulatory Visit (HOSPITAL_COMMUNITY)
Admission: RE | Admit: 2018-06-06 | Discharge: 2018-06-06 | Disposition: A | Discharge status: Home / Self Care | Payer: Medicare HMO | Source: Ambulatory Visit | Attending: Family Medicine | Admitting: Family Medicine

## 2018-06-06 ENCOUNTER — Encounter (HOSPITAL_COMMUNITY): Payer: Self-pay

## 2018-06-06 DIAGNOSIS — Z1231 Encounter for screening mammogram for malignant neoplasm of breast: Secondary | ICD-10-CM | POA: Insufficient documentation

## 2018-06-08 DIAGNOSIS — E039 Hypothyroidism, unspecified: Secondary | ICD-10-CM | POA: Diagnosis not present

## 2018-06-08 DIAGNOSIS — R42 Dizziness and giddiness: Secondary | ICD-10-CM | POA: Diagnosis not present

## 2018-06-08 DIAGNOSIS — J449 Chronic obstructive pulmonary disease, unspecified: Secondary | ICD-10-CM | POA: Diagnosis not present

## 2018-06-08 DIAGNOSIS — I69398 Other sequelae of cerebral infarction: Secondary | ICD-10-CM | POA: Diagnosis not present

## 2018-06-08 DIAGNOSIS — M21371 Foot drop, right foot: Secondary | ICD-10-CM | POA: Diagnosis not present

## 2018-06-08 DIAGNOSIS — M1990 Unspecified osteoarthritis, unspecified site: Secondary | ICD-10-CM | POA: Diagnosis not present

## 2018-06-08 DIAGNOSIS — M810 Age-related osteoporosis without current pathological fracture: Secondary | ICD-10-CM | POA: Diagnosis not present

## 2018-06-08 DIAGNOSIS — R69 Illness, unspecified: Secondary | ICD-10-CM | POA: Diagnosis not present

## 2018-06-08 DIAGNOSIS — E785 Hyperlipidemia, unspecified: Secondary | ICD-10-CM | POA: Diagnosis not present

## 2018-06-11 DIAGNOSIS — E785 Hyperlipidemia, unspecified: Secondary | ICD-10-CM | POA: Diagnosis not present

## 2018-06-11 DIAGNOSIS — E039 Hypothyroidism, unspecified: Secondary | ICD-10-CM | POA: Diagnosis not present

## 2018-06-11 DIAGNOSIS — I69398 Other sequelae of cerebral infarction: Secondary | ICD-10-CM | POA: Diagnosis not present

## 2018-06-11 DIAGNOSIS — R69 Illness, unspecified: Secondary | ICD-10-CM | POA: Diagnosis not present

## 2018-06-11 DIAGNOSIS — M1990 Unspecified osteoarthritis, unspecified site: Secondary | ICD-10-CM | POA: Diagnosis not present

## 2018-06-11 DIAGNOSIS — R42 Dizziness and giddiness: Secondary | ICD-10-CM | POA: Diagnosis not present

## 2018-06-11 DIAGNOSIS — M810 Age-related osteoporosis without current pathological fracture: Secondary | ICD-10-CM | POA: Diagnosis not present

## 2018-06-11 DIAGNOSIS — M21371 Foot drop, right foot: Secondary | ICD-10-CM | POA: Diagnosis not present

## 2018-06-11 DIAGNOSIS — J449 Chronic obstructive pulmonary disease, unspecified: Secondary | ICD-10-CM | POA: Diagnosis not present

## 2018-06-18 DIAGNOSIS — M1990 Unspecified osteoarthritis, unspecified site: Secondary | ICD-10-CM | POA: Diagnosis not present

## 2018-06-18 DIAGNOSIS — E039 Hypothyroidism, unspecified: Secondary | ICD-10-CM | POA: Diagnosis not present

## 2018-06-18 DIAGNOSIS — M810 Age-related osteoporosis without current pathological fracture: Secondary | ICD-10-CM | POA: Diagnosis not present

## 2018-06-18 DIAGNOSIS — R42 Dizziness and giddiness: Secondary | ICD-10-CM | POA: Diagnosis not present

## 2018-06-18 DIAGNOSIS — J449 Chronic obstructive pulmonary disease, unspecified: Secondary | ICD-10-CM | POA: Diagnosis not present

## 2018-06-18 DIAGNOSIS — E785 Hyperlipidemia, unspecified: Secondary | ICD-10-CM | POA: Diagnosis not present

## 2018-06-18 DIAGNOSIS — M21371 Foot drop, right foot: Secondary | ICD-10-CM | POA: Diagnosis not present

## 2018-06-18 DIAGNOSIS — I69398 Other sequelae of cerebral infarction: Secondary | ICD-10-CM | POA: Diagnosis not present

## 2018-06-18 DIAGNOSIS — R69 Illness, unspecified: Secondary | ICD-10-CM | POA: Diagnosis not present

## 2018-06-20 DIAGNOSIS — E039 Hypothyroidism, unspecified: Secondary | ICD-10-CM | POA: Diagnosis not present

## 2018-06-20 DIAGNOSIS — I69398 Other sequelae of cerebral infarction: Secondary | ICD-10-CM | POA: Diagnosis not present

## 2018-06-20 DIAGNOSIS — R69 Illness, unspecified: Secondary | ICD-10-CM | POA: Diagnosis not present

## 2018-06-20 DIAGNOSIS — M21371 Foot drop, right foot: Secondary | ICD-10-CM | POA: Diagnosis not present

## 2018-06-20 DIAGNOSIS — M810 Age-related osteoporosis without current pathological fracture: Secondary | ICD-10-CM | POA: Diagnosis not present

## 2018-06-20 DIAGNOSIS — E785 Hyperlipidemia, unspecified: Secondary | ICD-10-CM | POA: Diagnosis not present

## 2018-06-20 DIAGNOSIS — R42 Dizziness and giddiness: Secondary | ICD-10-CM | POA: Diagnosis not present

## 2018-06-20 DIAGNOSIS — J449 Chronic obstructive pulmonary disease, unspecified: Secondary | ICD-10-CM | POA: Diagnosis not present

## 2018-06-20 DIAGNOSIS — M1990 Unspecified osteoarthritis, unspecified site: Secondary | ICD-10-CM | POA: Diagnosis not present

## 2018-06-25 DIAGNOSIS — J449 Chronic obstructive pulmonary disease, unspecified: Secondary | ICD-10-CM | POA: Diagnosis not present

## 2018-06-25 DIAGNOSIS — E039 Hypothyroidism, unspecified: Secondary | ICD-10-CM | POA: Diagnosis not present

## 2018-06-25 DIAGNOSIS — M810 Age-related osteoporosis without current pathological fracture: Secondary | ICD-10-CM | POA: Diagnosis not present

## 2018-06-25 DIAGNOSIS — R42 Dizziness and giddiness: Secondary | ICD-10-CM | POA: Diagnosis not present

## 2018-06-25 DIAGNOSIS — E785 Hyperlipidemia, unspecified: Secondary | ICD-10-CM | POA: Diagnosis not present

## 2018-06-25 DIAGNOSIS — I69398 Other sequelae of cerebral infarction: Secondary | ICD-10-CM | POA: Diagnosis not present

## 2018-06-25 DIAGNOSIS — R69 Illness, unspecified: Secondary | ICD-10-CM | POA: Diagnosis not present

## 2018-06-25 DIAGNOSIS — M21371 Foot drop, right foot: Secondary | ICD-10-CM | POA: Diagnosis not present

## 2018-06-25 DIAGNOSIS — M1990 Unspecified osteoarthritis, unspecified site: Secondary | ICD-10-CM | POA: Diagnosis not present

## 2018-06-27 DIAGNOSIS — E785 Hyperlipidemia, unspecified: Secondary | ICD-10-CM | POA: Diagnosis not present

## 2018-06-27 DIAGNOSIS — R42 Dizziness and giddiness: Secondary | ICD-10-CM | POA: Diagnosis not present

## 2018-06-27 DIAGNOSIS — E039 Hypothyroidism, unspecified: Secondary | ICD-10-CM | POA: Diagnosis not present

## 2018-06-27 DIAGNOSIS — J449 Chronic obstructive pulmonary disease, unspecified: Secondary | ICD-10-CM | POA: Diagnosis not present

## 2018-06-27 DIAGNOSIS — M1990 Unspecified osteoarthritis, unspecified site: Secondary | ICD-10-CM | POA: Diagnosis not present

## 2018-06-27 DIAGNOSIS — I69398 Other sequelae of cerebral infarction: Secondary | ICD-10-CM | POA: Diagnosis not present

## 2018-06-27 DIAGNOSIS — M21371 Foot drop, right foot: Secondary | ICD-10-CM | POA: Diagnosis not present

## 2018-06-27 DIAGNOSIS — M810 Age-related osteoporosis without current pathological fracture: Secondary | ICD-10-CM | POA: Diagnosis not present

## 2018-06-27 DIAGNOSIS — R69 Illness, unspecified: Secondary | ICD-10-CM | POA: Diagnosis not present

## 2018-07-02 DIAGNOSIS — R42 Dizziness and giddiness: Secondary | ICD-10-CM | POA: Diagnosis not present

## 2018-07-02 DIAGNOSIS — R69 Illness, unspecified: Secondary | ICD-10-CM | POA: Diagnosis not present

## 2018-07-02 DIAGNOSIS — M21371 Foot drop, right foot: Secondary | ICD-10-CM | POA: Diagnosis not present

## 2018-07-02 DIAGNOSIS — M810 Age-related osteoporosis without current pathological fracture: Secondary | ICD-10-CM | POA: Diagnosis not present

## 2018-07-02 DIAGNOSIS — E785 Hyperlipidemia, unspecified: Secondary | ICD-10-CM | POA: Diagnosis not present

## 2018-07-02 DIAGNOSIS — J449 Chronic obstructive pulmonary disease, unspecified: Secondary | ICD-10-CM | POA: Diagnosis not present

## 2018-07-02 DIAGNOSIS — E039 Hypothyroidism, unspecified: Secondary | ICD-10-CM | POA: Diagnosis not present

## 2018-07-02 DIAGNOSIS — M1990 Unspecified osteoarthritis, unspecified site: Secondary | ICD-10-CM | POA: Diagnosis not present

## 2018-07-02 DIAGNOSIS — I69398 Other sequelae of cerebral infarction: Secondary | ICD-10-CM | POA: Diagnosis not present

## 2018-07-04 DIAGNOSIS — R42 Dizziness and giddiness: Secondary | ICD-10-CM | POA: Diagnosis not present

## 2018-07-04 DIAGNOSIS — M1990 Unspecified osteoarthritis, unspecified site: Secondary | ICD-10-CM | POA: Diagnosis not present

## 2018-07-04 DIAGNOSIS — R69 Illness, unspecified: Secondary | ICD-10-CM | POA: Diagnosis not present

## 2018-07-04 DIAGNOSIS — I69398 Other sequelae of cerebral infarction: Secondary | ICD-10-CM | POA: Diagnosis not present

## 2018-07-04 DIAGNOSIS — J449 Chronic obstructive pulmonary disease, unspecified: Secondary | ICD-10-CM | POA: Diagnosis not present

## 2018-07-04 DIAGNOSIS — M810 Age-related osteoporosis without current pathological fracture: Secondary | ICD-10-CM | POA: Diagnosis not present

## 2018-07-04 DIAGNOSIS — M21371 Foot drop, right foot: Secondary | ICD-10-CM | POA: Diagnosis not present

## 2018-07-04 DIAGNOSIS — E785 Hyperlipidemia, unspecified: Secondary | ICD-10-CM | POA: Diagnosis not present

## 2018-07-04 DIAGNOSIS — E039 Hypothyroidism, unspecified: Secondary | ICD-10-CM | POA: Diagnosis not present

## 2018-07-06 DIAGNOSIS — M21371 Foot drop, right foot: Secondary | ICD-10-CM | POA: Diagnosis not present

## 2018-07-06 DIAGNOSIS — R42 Dizziness and giddiness: Secondary | ICD-10-CM | POA: Diagnosis not present

## 2018-07-06 DIAGNOSIS — J449 Chronic obstructive pulmonary disease, unspecified: Secondary | ICD-10-CM | POA: Diagnosis not present

## 2018-07-06 DIAGNOSIS — I69398 Other sequelae of cerebral infarction: Secondary | ICD-10-CM | POA: Diagnosis not present

## 2018-07-08 DIAGNOSIS — E785 Hyperlipidemia, unspecified: Secondary | ICD-10-CM | POA: Diagnosis not present

## 2018-07-08 DIAGNOSIS — M21371 Foot drop, right foot: Secondary | ICD-10-CM | POA: Diagnosis not present

## 2018-07-08 DIAGNOSIS — R42 Dizziness and giddiness: Secondary | ICD-10-CM | POA: Diagnosis not present

## 2018-07-08 DIAGNOSIS — E039 Hypothyroidism, unspecified: Secondary | ICD-10-CM | POA: Diagnosis not present

## 2018-07-08 DIAGNOSIS — M1990 Unspecified osteoarthritis, unspecified site: Secondary | ICD-10-CM | POA: Diagnosis not present

## 2018-07-08 DIAGNOSIS — M810 Age-related osteoporosis without current pathological fracture: Secondary | ICD-10-CM | POA: Diagnosis not present

## 2018-07-08 DIAGNOSIS — R69 Illness, unspecified: Secondary | ICD-10-CM | POA: Diagnosis not present

## 2018-07-08 DIAGNOSIS — I69398 Other sequelae of cerebral infarction: Secondary | ICD-10-CM | POA: Diagnosis not present

## 2018-07-08 DIAGNOSIS — J449 Chronic obstructive pulmonary disease, unspecified: Secondary | ICD-10-CM | POA: Diagnosis not present

## 2018-07-09 DIAGNOSIS — M21371 Foot drop, right foot: Secondary | ICD-10-CM | POA: Diagnosis not present

## 2018-07-09 DIAGNOSIS — E039 Hypothyroidism, unspecified: Secondary | ICD-10-CM | POA: Diagnosis not present

## 2018-07-09 DIAGNOSIS — R69 Illness, unspecified: Secondary | ICD-10-CM | POA: Diagnosis not present

## 2018-07-09 DIAGNOSIS — M810 Age-related osteoporosis without current pathological fracture: Secondary | ICD-10-CM | POA: Diagnosis not present

## 2018-07-09 DIAGNOSIS — I69398 Other sequelae of cerebral infarction: Secondary | ICD-10-CM | POA: Diagnosis not present

## 2018-07-09 DIAGNOSIS — M1990 Unspecified osteoarthritis, unspecified site: Secondary | ICD-10-CM | POA: Diagnosis not present

## 2018-07-09 DIAGNOSIS — J449 Chronic obstructive pulmonary disease, unspecified: Secondary | ICD-10-CM | POA: Diagnosis not present

## 2018-07-09 DIAGNOSIS — R42 Dizziness and giddiness: Secondary | ICD-10-CM | POA: Diagnosis not present

## 2018-07-09 DIAGNOSIS — E785 Hyperlipidemia, unspecified: Secondary | ICD-10-CM | POA: Diagnosis not present

## 2018-07-16 DIAGNOSIS — J449 Chronic obstructive pulmonary disease, unspecified: Secondary | ICD-10-CM | POA: Diagnosis not present

## 2018-07-16 DIAGNOSIS — M21371 Foot drop, right foot: Secondary | ICD-10-CM | POA: Diagnosis not present

## 2018-07-16 DIAGNOSIS — R69 Illness, unspecified: Secondary | ICD-10-CM | POA: Diagnosis not present

## 2018-07-16 DIAGNOSIS — M810 Age-related osteoporosis without current pathological fracture: Secondary | ICD-10-CM | POA: Diagnosis not present

## 2018-07-16 DIAGNOSIS — E039 Hypothyroidism, unspecified: Secondary | ICD-10-CM | POA: Diagnosis not present

## 2018-07-16 DIAGNOSIS — M1990 Unspecified osteoarthritis, unspecified site: Secondary | ICD-10-CM | POA: Diagnosis not present

## 2018-07-16 DIAGNOSIS — R42 Dizziness and giddiness: Secondary | ICD-10-CM | POA: Diagnosis not present

## 2018-07-16 DIAGNOSIS — E785 Hyperlipidemia, unspecified: Secondary | ICD-10-CM | POA: Diagnosis not present

## 2018-07-16 DIAGNOSIS — I69398 Other sequelae of cerebral infarction: Secondary | ICD-10-CM | POA: Diagnosis not present

## 2018-07-19 ENCOUNTER — Ambulatory Visit: Payer: Medicare Other | Admitting: Neurology

## 2018-07-19 ENCOUNTER — Encounter: Payer: Self-pay | Admitting: Neurology

## 2018-07-19 VITALS — BP 111/71 | HR 83 | Ht <= 58 in | Wt 160.0 lb

## 2018-07-19 DIAGNOSIS — M1990 Unspecified osteoarthritis, unspecified site: Secondary | ICD-10-CM | POA: Diagnosis not present

## 2018-07-19 DIAGNOSIS — E039 Hypothyroidism, unspecified: Secondary | ICD-10-CM | POA: Diagnosis not present

## 2018-07-19 DIAGNOSIS — J449 Chronic obstructive pulmonary disease, unspecified: Secondary | ICD-10-CM | POA: Diagnosis not present

## 2018-07-19 DIAGNOSIS — I69398 Other sequelae of cerebral infarction: Secondary | ICD-10-CM | POA: Diagnosis not present

## 2018-07-19 DIAGNOSIS — M6281 Muscle weakness (generalized): Secondary | ICD-10-CM | POA: Diagnosis not present

## 2018-07-19 DIAGNOSIS — W19XXXS Unspecified fall, sequela: Secondary | ICD-10-CM

## 2018-07-19 DIAGNOSIS — R42 Dizziness and giddiness: Secondary | ICD-10-CM | POA: Diagnosis not present

## 2018-07-19 DIAGNOSIS — M810 Age-related osteoporosis without current pathological fracture: Secondary | ICD-10-CM | POA: Diagnosis not present

## 2018-07-19 DIAGNOSIS — E785 Hyperlipidemia, unspecified: Secondary | ICD-10-CM | POA: Diagnosis not present

## 2018-07-19 DIAGNOSIS — M21371 Foot drop, right foot: Secondary | ICD-10-CM | POA: Diagnosis not present

## 2018-07-19 NOTE — Patient Instructions (Signed)
I am not sure if you had a stroke or mini stroke.  Continue exercising regularly and complete your physical therapy.  Take your medications as directed. As discussed, prevention is key. Please stop smoking.  Please increase your water intake.  We will do a brain scan, called MRI and call you with the test results. We will have to schedule you for this on a separate date. This test requires authorization from your insurance, and we will take care of the insurance process.

## 2018-07-19 NOTE — Progress Notes (Signed)
Subjective:    Patient ID: [name] is a 73 y.o. female.  HPI     Amanda Foley, MD, PhD [facility] [address] [address] [place], [place] [address]  Dear Dr. Sherwood Gambler,   I saw your patient, Amanda Armstrong, upon your kind request, in my neurologic clinic today for initial consultation of her recurrent vertigo. The patient is accompanied by her husband and her daughter today. As you know, Amanda Armstrong is a 73 year old right-handed woman with an underlying complex medical history of hypothyroidism, anxiety, UTI, tachycardia, arthritis, prior history of C. difficile colitis and hospitalization for this, hyperlipidemia, edema, venous insufficiency, smoking, history of stroke, history of right foot drop, anxiety, chronic pain, on chronic narcotic pain medication, who reports recurrent falls and recent right sided weakness in September. She fell and went to the ER. She did not have a CTH at the time. She complained of ribcage pain and had a chest and abd CT on 04/03/18: IMPRESSION: 1. No evidence of mediastinal or pulmonary parenchymal injury. No evidence of solid organ injury or bowel perforation. 2. Mild compression of the superior endplate of T12 is new since previous study and may represent acute compression fracture. 3. Cholelithiasis without evidence of cholecystitis. 4. Benign-appearing left adrenal gland adenoma. 5. Diverticulosis of the sigmoid colon without evidence of diverticulitis.  She went back to ER on 04/13/18, and had a CXR: IMPRESSION: No active cardiopulmonary disease.  I reviewed your office note from 05/01/2018, which you kindly included. She has been on as needed and meclizine. Of note, she is also on potentially sedating medications including Xanax 1 mg qHS, Norco, 10/325 mg one pill qHS. She drinks about 2 cups of water and 2-3 cups of apple juice, no EtOH, smokes about  ppd. Is in [facility]. Feels that doing physical therapy has helped.  She also feels that the meclizine makes her less dizzy. Of note, when she fell in September she fell the first thing in the morning.   Of note, she had a brain MRI without contrast on 01/29/2016 and I reviewed the results: IMPRESSION: 1.  No acute intracranial abnormality. 2. Signal changes compatible with mild to moderate for age chronic small vessel disease. 3. Chronic lateral and third ventricular enlargement is stable since 2015 and nonspecific, but favor this is ex vacuo in nature.   Previously (copied from previous notes for reference):   I saw her on 12/29/2014, at which time she reported that she was diagnosed with COPD but has not seen a lung doctor. She still smokes, approximately half a pack per day. She almost fell 2 days ago. They were outside at a restaurant. She had not taken her walker with her. She does not tend to eat very regularly. Sometimes she drinks of protein milkshake in between. She does not like to drink water. She likes to drink fruit punch which contains sugar.  She has some leg swelling which is new. For the past month she has noted right leg swelling. She denies any shortness of breath. Her husband adds that she does not seem to have as much energy. She is easily tired. She denies any tenderness in her right leg. She denies restless leg symptoms.   I saw her on 11/04/2014, at which time her primary care physician referred her for evaluation of her snoring and daytime somnolence. Prior to that I saw her in December 2014 for a gait disorder. I invited her back for sleep study. She  had a baseline sleep study on 12/04/2014 and underwent over her test results with her in detail today. Her sleep efficiency was reduced at 77.2% with a latency to sleep of 16 minutes and wake after sleep onset of 69 minutes with mild sleep fragmentation noted and difficulty returning back to sleep after 4 AM. She had a normal arousal index. She had normal percentages of stage I and stage II sleep,  an increased percentage of slow-wave sleep, and a decreased percentage of REM sleep with a REM latency of 113 minutes, she had mild PLMS at 22.7 per hour, resulting and minimal or negligible arousals. She had mild intermittent snoring. Total AHI was borderline at 4.6 per hour, rising to 12.4/h during REM sleep. Supine sleep was not achieved. Average oxygen saturation was 91%, nadir was 86%. Time below 90% saturation was 48 minutes, time below 88% saturation was 12 minutes and 31 seconds.   She has fallen, and has a cane and walker available. She still smokes, 1 ppd, down from 2 ppd. She has an overnight pulse oximetry on 09/30/14, which I reviewed: Total recording time was 9 hours and 41 minutes. Average oxygen saturation was only 89.1%, nadir was 82%. Average pulse rate was 74, lowest was 53. Time below 89% saturation was 3 hours and 15 minutes.   She had a CTH wo contrast on 11/01/13, after a fall when she was in rehabilitation: 1. No acute intracranial abnormalities. No change from the prior study. 2. 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   I personally reviewed the images through the PACS system.   She usually goes to bed around 10 PM and falls asleep within 20 minutes. Her rise time 7 AM but she does not wake up rested. She denies morning headaches and nocturia. Her Epworth sleepiness score is 8 out of 24 and fatigue score is 38. She denies restless leg symptoms. She snores. She has had some pauses in her breathing.     She was admitted to [facility] hospital in November of this year with generalized weakness lasting several days. She had been seen previously for similar symptoms a few weeks prior. She had a brain MRI on 04/26/2013: Exam is motion degraded. No acute infarct. No intracranial hemorrhage. Mild to moderate small vessel disease type changes. Major intracranial vascular structures are patent. Global atrophy. Ventricular prominence  probably related to atrophy although difficult to completely exclude a mild component of hydrocephalus. No intracranial mass lesion noted on this unenhanced exam. Cervical spondylotic changes C3-4 level cord flattening. Congenital fusion C2 and C3. Mild exophthalmos. I personally reviewed those films through the PACS system.   She also had additional workup at [facility]. She was placed on Plavix in addition to aspirin. I do have the discharge summary from 05/27/2013 through 05/31/2013. Principal diagnosis was sepsis at the time, d/t UTI. She was discharged to SNF.   She was then admitted again to [facility] on 06/11/2012 with altered mental status. She had brain MRI which showed moderate cerebral atrophy with mild to moderate chronic microvascular changes and she had carotid Doppler studies which did not show any hemodynamically significant ICA stenoses. I reviewed her brain MRI myself on CD. She was treated with IV fluids and was discharged back to nursing home, at [place], where she stayed for 3 weeks, discharged about 2 weeks. She was told that she may have had a TIA. She reports feeling better, she still takes ASA and Plavix. She  says, that the second time she was admitted, she was not getting her xanax. She is taking Xanax 1 mg qHS. She is getting outpatient OT and will finish next week. She finished outpt PT and walks without a cane or walker. She feels at baseline. She snores mildly intermittently and no apneas are reported nor gasping sensations for air.  Her Past Medical History Is Significant For: Past Medical History:  Diagnosis Date  . Anxiety   . Arthritis    Bilateral Knee DJD  . Chronic pain   . Clostridium difficile carrier    pt stated that she has intermittent sx  . Clostridium difficile infection   . Headache(784.0)    SINUS HEADACHE OCCASSIONALLY  . Hypertension   . Hypothyroidism   . Multifactorial gait disorder   . Osteoporosis   . Vertigo     Her  Past Surgical History Is Significant For: Past Surgical History:  Procedure Laterality Date  . CATARACT EXTRACTION W/PHACO Right 06/28/2016   Procedure: CATARACT EXTRACTION PHACO AND INTRAOCULAR LENS PLACEMENT (IOC);  Surgeon: Jethro Bolus, MD;  Location: [facility];  Service: Ophthalmology;  Laterality: Right;  CDE: 11.10  . CATARACT EXTRACTION W/PHACO Left 07/26/2016   Procedure: CATARACT EXTRACTION PHACO AND INTRAOCULAR LENS PLACEMENT (IOC);  Surgeon: Jethro Bolus, MD;  Location: [facility];  Service: Ophthalmology;  Laterality: Left;  CDE: 8.88  . JOINT REPLACEMENT  11/08/3010   right total knee  . TOTAL KNEE ARTHROPLASTY  11/07/2011   Procedure: TOTAL KNEE ARTHROPLASTY;  Surgeon: Nilda Simmer, MD;  Location: [facility] OR;  Service: Orthopedics;  Laterality: Left;  DR Thurston Hole WANTS 90 MINUTES FOR THIS CASE  . TOTAL KNEE ARTHROPLASTY Right 2011  . TUBAL LIGATION  1982    Her Family History Is Significant For: Family History  Problem Relation Age of Onset  . Heart attack Mother   . COPD Father   . Arthritis Sister   . Heart disease Brother     Her Social History Is Significant For: Social History   Socioeconomic History  . Marital status: Married    Spouse name: Fayrene Fearing  . Number of children: 4  . Years of education: [employer]  . Highest education level: Not on file  Occupational History  . Not on file  Social Needs  . Financial resource strain: Not on file  . Food insecurity:    Worry: Not on file    Inability: Not on file  . Transportation needs:    Medical: Not on file    Non-medical: Not on file  Tobacco Use  . Smoking status: Current Every Day Smoker    Packs/day: 0.50    Years: 48.00    Pack years: 24.00    Types: Cigarettes    Last attempt to quit: 06/15/2012    Years since quitting: 6.0  . Smokeless tobacco: Never Used  . Tobacco comment: 12/29/14 1/2 PPD  Substance and Sexual Activity  . Alcohol use: No    Alcohol/week: 0.0 standard drinks  . Drug use: No  . Sexual  activity: Never    Birth control/protection: Surgical  Lifestyle  . Physical activity:    Days per week: Not on file    Minutes per session: Not on file  . Stress: Not on file  Relationships  . Social connections:    Talks on phone: Not on file    Gets together: Not on file    Attends religious service: Not on file    Active member of club  or organization: Not on file    Attends meetings of clubs or organizations: Not on file    Relationship status: Not on file  Other Topics Concern  . Not on file  Social History Narrative   1 cup of coffee a day     Her Allergies Are:  Allergies  Allergen Reactions  . Iron Other (See Comments)    "upset stomach"  . Oxycodone Other (See Comments)    "CX HER TO FELL LIKE SHE IS OUT OF HER BODY"  . Sudafed [Pseudoephedrine Hcl] Other (See Comments)    "funny feeling"  :   Her Current Medications Are:  Outpatient Encounter Medications as of 07/19/2018  Medication Sig  . alendronate (FOSAMAX) 70 MG tablet Take 70 mg by mouth every Sunday.   . ALPRAZolam (XANAX) 1 MG tablet Take 1 tablet (1 mg total) by mouth 3 (three) times daily. (Patient taking differently: Take 1 mg by mouth at bedtime. )  . aspirin EC 81 MG tablet Take 81 mg by mouth daily.  Marland Kitchen atorvastatin (LIPITOR) 20 MG tablet Take 20 mg by mouth at bedtime.  . cephALEXin (KEFLEX) 500 MG capsule Take 1 capsule (500 mg total) by mouth 4 (four) times daily.  . ciprofloxacin (CIPRO) 500 MG tablet Take 1 tablet (500 mg total) by mouth 2 (two) times daily.  . furosemide (LASIX) 20 MG tablet Take 20 mg by mouth daily.  Marland Kitchen levothyroxine (SYNTHROID, LEVOTHROID) 75 MCG tablet Take 75 mcg by mouth daily.  . meclizine (ANTIVERT) 25 MG tablet Take 1 tablet (25 mg total) by mouth 3 (three) times daily as needed for dizziness.  . vitamin B-12 (CYANOCOBALAMIN) 500 MCG tablet Take 500 mcg by mouth daily.  . [DISCONTINUED] CRANBERRY EXTRACT PO Take 1 capsule by mouth daily.  . [DISCONTINUED] Difluprednate  0.05 % EMUL Place 1 drop into the right eye 2 (two) times daily.  . [DISCONTINUED] furosemide (LASIX) 20 MG tablet Take 1 tablet (20 mg total) by mouth daily. Resume in 3 days (Patient taking differently: Take 20 mg by mouth daily. )  . [DISCONTINUED] HYDROcodone-acetaminophen (NORCO) 10-325 MG tablet Take 1 tablet by mouth every 6 (six) hours as needed for pain.  . [DISCONTINUED] HYDROcodone-acetaminophen (NORCO/VICODIN) 5-325 MG tablet Take 1-2 tablets by mouth every 6 (six) hours as needed. (Patient not taking: Reported on 06/21/2016)  . [DISCONTINUED] naproxen sodium (ANAPROX) 220 MG tablet Take 440 mg by mouth 2 (two) times daily as needed (pain).  . [DISCONTINUED] ofloxacin (OCUFLOX) 0.3 % ophthalmic solution Place 1 drop into the right eye 2 (two) times daily.  . [DISCONTINUED] Probiotic Product (PROBIOTIC PO) Take 1 capsule by mouth daily.  . [DISCONTINUED] sulfamethoxazole-trimethoprim (BACTRIM,SEPTRA) 400-80 MG tablet Take 1 tablet by mouth daily.   No facility-administered encounter medications on file as of 07/19/2018.   :   Review of Systems:  Out of a complete 14 point review of systems, all are reviewed and negative with the exception of these symptoms as listed below:         Review of Systems  Neurological:       Pt presents today because her PCP thinks she had a mini stroke and wanted her to be evaluated by neurologist. Pt drags the right side,slurred speech and mobility has decreased. She has been having multiple falls and they are unsure if [facility] completed imaging during those episode. Pt is completing PT at this time and getting better with that. Complains of some dizziness at times.  Objective:  Neurological Exam  Physical Exam Physical Examination:   Vitals:   07/19/18 1022  BP: 111/71  Pulse: 83    General Examination: The patient is a very pleasant 73 y.o. female in no acute distress. She appears frail and deconditioned, situated in her  wheelchair, well groomed. Currently, no vertiginous symptoms.  HEENT: Normocephalic, atraumatic, pupils are equal, round and reactive to light and accommodation. Extraocular tracking is good without limitation to gaze excursion or nystagmus noted. Normal smooth pursuit is noted. Hearing is grossly intact. Face is symmetric with normal facial animation and normal facial sensation. Speech is clear with no dysarthria noted. There is no hypophonia. There is no lip, neck/head, jaw or voice tremor. Neck is supple but shows mild decrease in range of motion active motion. She has no carotid bruits on auscultation. Oropharynx exam reveals: mild to moderate mouth dryness, adequate dental hygiene and mild airway crowding. Tongue protrudes centrally and palate elevates symmetrically.    Chest: Clear to auscultation with no wheeze, or crackles. Overall distant sounding breath sounds.  Heart: S1+S2+0, regular and normal without murmurs, rubs or gallops noted.   Abdomen: Soft, non-tender and non-distended with normal bowel sounds appreciated on auscultation.  Extremities: There is Mild edema in the distal lower extremities, she has chronic appearing bruising across her hands and forearms.  Skin: Warm and dry without trophic changes noted.   Musculoskeletal: exam reveals no obvious joint deformities, tenderness or joint swelling or erythema.  Neurologically:  Mental status: The patient is awake, alert and oriented in all 4 spheres. Her memory, attention, language and knowledge are appropriate. There is no aphasia, agnosia, apraxia or anomia. Speech is clear with normal prosody and enunciation. Thought process is linear. Mood is congruent and affect is a little blunted.   Cranial nerves are as described above under HEENT exam. In addition, shoulder shrug is normal with equal shoulder height noted. Motor exam: Normal bulk, global strength of 4-5, no lateralization, no drift, no tremor, Romberg is not testable  safely. Reflexes are 1+ throughout, trace in the ankles. Fine motor skills are globally mildly impaired. She has no cerebellar signs such as dysmetria or intention tremor. Sensory exam is intact to light touch in the upper and lower extremities.  Gait, station and balance: She did not bring her walker today, She required minimal assistance to get out of the wheelchair, she was able to walk briefly without her walking aid but walks slightly wide-based and insecurely, turned in 3 steps. No limp.  Assessment and Plan:   In summary, Kirstein P Vedia Pereyraerdue is a very pleasant 73 year old female with an underlying complex medical history of hypothyroidism, anxiety, UTI, smoking, COPD, tachycardia, arthritis, hospitalizations in the past for C. difficile colitis and complications from colitis and hypovolemic shock, who presents for evaluation of her gait disorder. She has a history of recurrent falls. She has a history of vertigo. She had one-sided weakness upon awakening in September 2019 for which she went to the emergency room after a fall. She has no current lateralizing symptoms but has overall global weakness and insecure gait. I explained to her that deconditioning and dehydration may also be at play, in addition to taking medications that could adversely affect her balance. She is taking high risk medication in the combination of taking the narcotic pain medicine at night as well as a benzodiazepine is considered high risk. She is advised to stay better hydrated with water, change positions slowly and continue with her physical therapy.  I would like to proceed with a brain MRI to see if there is any structural cause of her symptoms or evidence of a recent stroke. She does have stroke risk factors and is advised to continue with her baby aspirin and cholesterol medicine. Her biggest stroke risk factor is probably her smoking. She strongly advised to work on smoking cessation. She has reduced her smoking to half a  pack per day currently. We will keep you posted as to her MRI results and I will see her in follow-up as needed. We talked about risk factor reduction for vascular disease in general and pursuing healthy lifestyle. I answered all their questions today and the patient and her family were in agreement.  Thank you very much for allowing me to participate in the care of this nice patient. If I can be of any further assistance to you please do not hesitate to call me at [phone].  Sincerely,

## 2018-07-20 ENCOUNTER — Telehealth: Payer: Self-pay | Admitting: Neurology

## 2018-07-20 NOTE — Telephone Encounter (Signed)
[employer] Medicare no auth per [facility] website patient is scheduled at [facility] for 07/27/18 arrival time is 10:30 Am patient is aware of time & day. I also gave her their number of [phone] just incase she needed to r/s for any reason.

## 2018-07-23 DIAGNOSIS — M21371 Foot drop, right foot: Secondary | ICD-10-CM | POA: Diagnosis not present

## 2018-07-23 DIAGNOSIS — M810 Age-related osteoporosis without current pathological fracture: Secondary | ICD-10-CM | POA: Diagnosis not present

## 2018-07-23 DIAGNOSIS — M1990 Unspecified osteoarthritis, unspecified site: Secondary | ICD-10-CM | POA: Diagnosis not present

## 2018-07-23 DIAGNOSIS — I69398 Other sequelae of cerebral infarction: Secondary | ICD-10-CM | POA: Diagnosis not present

## 2018-07-23 DIAGNOSIS — J449 Chronic obstructive pulmonary disease, unspecified: Secondary | ICD-10-CM | POA: Diagnosis not present

## 2018-07-23 DIAGNOSIS — E039 Hypothyroidism, unspecified: Secondary | ICD-10-CM | POA: Diagnosis not present

## 2018-07-23 DIAGNOSIS — E785 Hyperlipidemia, unspecified: Secondary | ICD-10-CM | POA: Diagnosis not present

## 2018-07-23 DIAGNOSIS — R42 Dizziness and giddiness: Secondary | ICD-10-CM | POA: Diagnosis not present

## 2018-07-25 DIAGNOSIS — R42 Dizziness and giddiness: Secondary | ICD-10-CM | POA: Diagnosis not present

## 2018-07-25 DIAGNOSIS — J449 Chronic obstructive pulmonary disease, unspecified: Secondary | ICD-10-CM | POA: Diagnosis not present

## 2018-07-25 DIAGNOSIS — M1990 Unspecified osteoarthritis, unspecified site: Secondary | ICD-10-CM | POA: Diagnosis not present

## 2018-07-25 DIAGNOSIS — M21371 Foot drop, right foot: Secondary | ICD-10-CM | POA: Diagnosis not present

## 2018-07-25 DIAGNOSIS — M810 Age-related osteoporosis without current pathological fracture: Secondary | ICD-10-CM | POA: Diagnosis not present

## 2018-07-25 DIAGNOSIS — I69398 Other sequelae of cerebral infarction: Secondary | ICD-10-CM | POA: Diagnosis not present

## 2018-07-25 DIAGNOSIS — E039 Hypothyroidism, unspecified: Secondary | ICD-10-CM | POA: Diagnosis not present

## 2018-07-25 DIAGNOSIS — E785 Hyperlipidemia, unspecified: Secondary | ICD-10-CM | POA: Diagnosis not present

## 2018-07-26 DIAGNOSIS — Z0001 Encounter for general adult medical examination with abnormal findings: Secondary | ICD-10-CM | POA: Diagnosis not present

## 2018-07-26 DIAGNOSIS — Z1389 Encounter for screening for other disorder: Secondary | ICD-10-CM | POA: Diagnosis not present

## 2018-07-26 DIAGNOSIS — G894 Chronic pain syndrome: Secondary | ICD-10-CM | POA: Diagnosis not present

## 2018-07-26 DIAGNOSIS — E063 Autoimmune thyroiditis: Secondary | ICD-10-CM | POA: Diagnosis not present

## 2018-07-26 DIAGNOSIS — J449 Chronic obstructive pulmonary disease, unspecified: Secondary | ICD-10-CM | POA: Diagnosis not present

## 2018-07-27 ENCOUNTER — Ambulatory Visit (HOSPITAL_COMMUNITY)
Admission: RE | Admit: 2018-07-27 | Discharge: 2018-07-27 | Disposition: A | Discharge status: Home / Self Care | Payer: Medicare Other | Source: Ambulatory Visit | Attending: Neurology | Admitting: Neurology

## 2018-07-27 DIAGNOSIS — I6782 Cerebral ischemia: Secondary | ICD-10-CM | POA: Insufficient documentation

## 2018-07-27 DIAGNOSIS — R531 Weakness: Secondary | ICD-10-CM | POA: Diagnosis not present

## 2018-07-27 DIAGNOSIS — M6281 Muscle weakness (generalized): Secondary | ICD-10-CM | POA: Diagnosis not present

## 2018-07-27 DIAGNOSIS — W19XXXS Unspecified fall, sequela: Secondary | ICD-10-CM | POA: Diagnosis not present

## 2018-07-27 NOTE — Progress Notes (Signed)
Please call [name] showed no acute findings and no new findings compared to prior MRI from 2017. As discussed, she can FU with PCP.  Amanda Armstrong

## 2018-07-29 ENCOUNTER — Emergency Department (HOSPITAL_COMMUNITY)
Admission: EM | Admit: 2018-07-29 | Discharge: 2018-07-29 | Disposition: A | Discharge status: Home / Self Care | Payer: Medicare Other | Attending: Emergency Medicine | Admitting: Emergency Medicine

## 2018-07-29 ENCOUNTER — Emergency Department (HOSPITAL_COMMUNITY): Payer: Medicare Other

## 2018-07-29 ENCOUNTER — Other Ambulatory Visit: Payer: Self-pay

## 2018-07-29 ENCOUNTER — Encounter (HOSPITAL_COMMUNITY): Payer: Self-pay | Admitting: Emergency Medicine

## 2018-07-29 DIAGNOSIS — Y929 Unspecified place or not applicable: Secondary | ICD-10-CM | POA: Insufficient documentation

## 2018-07-29 DIAGNOSIS — Y9389 Activity, other specified: Secondary | ICD-10-CM | POA: Diagnosis not present

## 2018-07-29 DIAGNOSIS — N390 Urinary tract infection, site not specified: Secondary | ICD-10-CM | POA: Diagnosis not present

## 2018-07-29 DIAGNOSIS — E039 Hypothyroidism, unspecified: Secondary | ICD-10-CM | POA: Insufficient documentation

## 2018-07-29 DIAGNOSIS — R58 Hemorrhage, not elsewhere classified: Secondary | ICD-10-CM | POA: Diagnosis not present

## 2018-07-29 DIAGNOSIS — Z79899 Other long term (current) drug therapy: Secondary | ICD-10-CM | POA: Insufficient documentation

## 2018-07-29 DIAGNOSIS — R296 Repeated falls: Secondary | ICD-10-CM | POA: Diagnosis not present

## 2018-07-29 DIAGNOSIS — Z8673 Personal history of transient ischemic attack (TIA), and cerebral infarction without residual deficits: Secondary | ICD-10-CM | POA: Insufficient documentation

## 2018-07-29 DIAGNOSIS — Z7982 Long term (current) use of aspirin: Secondary | ICD-10-CM | POA: Diagnosis not present

## 2018-07-29 DIAGNOSIS — F17210 Nicotine dependence, cigarettes, uncomplicated: Secondary | ICD-10-CM | POA: Insufficient documentation

## 2018-07-29 DIAGNOSIS — S299XXA Unspecified injury of thorax, initial encounter: Secondary | ICD-10-CM | POA: Diagnosis not present

## 2018-07-29 DIAGNOSIS — W19XXXA Unspecified fall, initial encounter: Secondary | ICD-10-CM | POA: Diagnosis not present

## 2018-07-29 DIAGNOSIS — W01198A Fall on same level from slipping, tripping and stumbling with subsequent striking against other object, initial encounter: Secondary | ICD-10-CM | POA: Diagnosis not present

## 2018-07-29 DIAGNOSIS — I10 Essential (primary) hypertension: Secondary | ICD-10-CM | POA: Diagnosis not present

## 2018-07-29 DIAGNOSIS — I959 Hypotension, unspecified: Secondary | ICD-10-CM | POA: Diagnosis not present

## 2018-07-29 DIAGNOSIS — S60221A Contusion of right hand, initial encounter: Secondary | ICD-10-CM

## 2018-07-29 DIAGNOSIS — S0990XA Unspecified injury of head, initial encounter: Secondary | ICD-10-CM | POA: Diagnosis not present

## 2018-07-29 DIAGNOSIS — Y92009 Unspecified place in unspecified non-institutional (private) residence as the place of occurrence of the external cause: Secondary | ICD-10-CM

## 2018-07-29 DIAGNOSIS — Z96653 Presence of artificial knee joint, bilateral: Secondary | ICD-10-CM | POA: Diagnosis not present

## 2018-07-29 DIAGNOSIS — M79641 Pain in right hand: Secondary | ICD-10-CM | POA: Diagnosis not present

## 2018-07-29 DIAGNOSIS — M25531 Pain in right wrist: Secondary | ICD-10-CM | POA: Diagnosis not present

## 2018-07-29 DIAGNOSIS — Y998 Other external cause status: Secondary | ICD-10-CM | POA: Diagnosis not present

## 2018-07-29 DIAGNOSIS — R531 Weakness: Secondary | ICD-10-CM | POA: Diagnosis not present

## 2018-07-29 DIAGNOSIS — F419 Anxiety disorder, unspecified: Secondary | ICD-10-CM | POA: Insufficient documentation

## 2018-07-29 DIAGNOSIS — S6991XA Unspecified injury of right wrist, hand and finger(s), initial encounter: Secondary | ICD-10-CM | POA: Diagnosis not present

## 2018-07-29 DIAGNOSIS — S199XXA Unspecified injury of neck, initial encounter: Secondary | ICD-10-CM | POA: Diagnosis not present

## 2018-07-29 HISTORY — DX: Repeated falls: R29.6

## 2018-07-29 HISTORY — DX: Somnolence: R40.0

## 2018-07-29 HISTORY — DX: Long term (current) use of opiate analgesic: Z79.891

## 2018-07-29 HISTORY — DX: Snoring: R06.83

## 2018-07-29 HISTORY — DX: Foot drop, right foot: M21.371

## 2018-07-29 HISTORY — DX: Cerebral infarction, unspecified: I63.9

## 2018-07-29 HISTORY — DX: Weakness: R53.1

## 2018-07-29 HISTORY — DX: Tobacco use: Z72.0

## 2018-07-29 LAB — ETHANOL

## 2018-07-29 LAB — CBC WITH DIFFERENTIAL/PLATELET
Abs Immature Granulocytes: 0.01 10*3/uL (ref 0.00–0.07)
BASOS ABS: 0 10*3/uL (ref 0.0–0.1)
Basophils Relative: 1 %
EOS ABS: 0.2 10*3/uL (ref 0.0–0.5)
Eosinophils Relative: 3 %
HEMATOCRIT: 43.6 % (ref 36.0–46.0)
Hemoglobin: 13.7 g/dL (ref 12.0–15.0)
IMMATURE GRANULOCYTES: 0 %
Lymphocytes Relative: 28 %
Lymphs Abs: 1.9 10*3/uL (ref 0.7–4.0)
MCH: 29.8 pg (ref 26.0–34.0)
MCHC: 31.4 g/dL (ref 30.0–36.0)
MCV: 94.8 fL (ref 80.0–100.0)
Monocytes Absolute: 0.5 10*3/uL (ref 0.1–1.0)
Monocytes Relative: 8 %
NEUTROS PCT: 60 %
NRBC: 0 % (ref 0.0–0.2)
Neutro Abs: 4.1 10*3/uL (ref 1.7–7.7)
PLATELETS: 267 10*3/uL (ref 150–400)
RBC: 4.6 MIL/uL (ref 3.87–5.11)
RDW: 14.3 % (ref 11.5–15.5)
WBC: 6.8 10*3/uL (ref 4.0–10.5)

## 2018-07-29 LAB — URINALYSIS, ROUTINE W REFLEX MICROSCOPIC
Bilirubin Urine: NEGATIVE
Glucose, UA: NEGATIVE mg/dL
Ketones, ur: NEGATIVE mg/dL
NITRITE: NEGATIVE
PH: 5 (ref 5.0–8.0)
Protein, ur: NEGATIVE mg/dL
Specific Gravity, Urine: 1.01 (ref 1.005–1.030)
WBC, UA: >50 WBC/hpf — ABNORMAL HIGH (ref 0–5)

## 2018-07-29 LAB — RAPID URINE DRUG SCREEN, HOSP PERFORMED
Amphetamines: NOT DETECTED
Barbiturates: NOT DETECTED
Benzodiazepines: POSITIVE — AB
Cocaine: NOT DETECTED
Opiates: POSITIVE — AB
Tetrahydrocannabinol: NOT DETECTED

## 2018-07-29 LAB — COMPREHENSIVE METABOLIC PANEL
ALT: 27 U/L (ref 0–44)
AST: 22 U/L (ref 15–41)
Albumin: 3.8 g/dL (ref 3.5–5.0)
Alkaline Phosphatase: 66 U/L (ref 38–126)
Anion gap: 10 (ref 5–15)
BUN: 17 mg/dL (ref 8–23)
CHLORIDE: 104 mmol/L (ref 98–111)
CO2: 27 mmol/L (ref 22–32)
Calcium: 9.2 mg/dL (ref 8.9–10.3)
Creatinine, Ser: 1.19 mg/dL — ABNORMAL HIGH (ref 0.44–1.00)
GFR calc Af Amer: 52 mL/min — ABNORMAL LOW (ref >60)
GFR calc non Af Amer: 45 mL/min — ABNORMAL LOW (ref >60)
Glucose, Bld: 119 mg/dL — ABNORMAL HIGH (ref 70–99)
Potassium: 3.6 mmol/L (ref 3.5–5.1)
Sodium: 141 mmol/L (ref 135–145)
Total Bilirubin: 0.5 mg/dL (ref 0.3–1.2)
Total Protein: 7.1 g/dL (ref 6.5–8.1)

## 2018-07-29 LAB — TROPONIN I: Troponin I: <0.03 ng/mL (ref <0.03)

## 2018-07-29 MED ORDER — CEPHALEXIN 500 MG PO CAPS: 500.0000 mg | ORAL_CAPSULE | Freq: Four times a day (QID) | ORAL | 0 refills | Status: DC

## 2018-07-29 NOTE — ED Notes (Signed)
Apple juice given.  

## 2018-07-29 NOTE — ED Provider Notes (Signed)
[facility] EMERGENCY DEPARTMENT Provider Note   CSN: [id number] Arrival date & time: 07/29/18  1740     History   Chief Complaint Chief Complaint  Patient presents with  . Fall    HPI Amanda Armstrong is a 72 y.o. female.   Fall     Pt was seen at 1745. Per EMS, pt's family and pt report: Pt c/o sudden onset and resolution of one episode of slip and fall while walking with her walker PTA. Pt states she hit her right hand against the doorway and "has a bruise there." Pt's family told EMS pt's has been "more weak and tired lately" and wanted her transported to the ED. Pt herself states she "feels fine" and has hx of frequent falls that she has been evaluated for by her PMD and Neuro MD (most recently 1.5 weeks ago).  Denies neck or back pain, no CP/SOB, no cough, no abd pain, no N/V/D, no focal motor weakness, no tingling/numbness in extremities, no syncope/LOC, no AMS.   Past Medical History:  Diagnosis Date  . Anxiety   . Arthritis    Bilateral Knee DJD  . Chronic pain   . Chronic pain   . Chronically on opiate therapy   . Clostridium difficile carrier    pt stated that she has intermittent sx  . Clostridium difficile infection   . Daytime somnolence   . Frequent falls   . Headache(784.0)    SINUS HEADACHE OCCASSIONALLY  . Hypertension   . Hypothyroidism   . Multifactorial gait disorder   . Osteoporosis   . Right foot drop   . Snoring   . Stroke (HCC)   . Tobacco use   . Vertigo   . Weakness     Patient Active Problem List   Diagnosis Date Noted  . UTI (urinary tract infection) 01/29/2016  . Nicotine dependence 09/07/2015  . Enteritis due to Clostridium difficile 08/29/2013  . Weakness 08/02/2013  . Diarrhea 08/02/2013  . Acute pyelonephritis 05/28/2013  . UTI (lower urinary tract infection) 05/27/2013  . Tachypnea 05/27/2013  . Hyperglycemia 05/27/2013  . Arthritis   . Anxiety   . Hypothyroidism     Past Surgical History:  Procedure Laterality  Date  . CATARACT EXTRACTION W/PHACO Right 06/28/2016   Procedure: CATARACT EXTRACTION PHACO AND INTRAOCULAR LENS PLACEMENT (IOC);  Surgeon: Jethro Bolus, MD;  Location: [facility];  Service: Ophthalmology;  Laterality: Right;  CDE: 11.10  . CATARACT EXTRACTION W/PHACO Left 07/26/2016   Procedure: CATARACT EXTRACTION PHACO AND INTRAOCULAR LENS PLACEMENT (IOC);  Surgeon: Jethro Bolus, MD;  Location: [facility];  Service: Ophthalmology;  Laterality: Left;  CDE: 8.88  . JOINT REPLACEMENT  11/08/3010   right total knee  . TOTAL KNEE ARTHROPLASTY  11/07/2011   Procedure: TOTAL KNEE ARTHROPLASTY;  Surgeon: Nilda Simmer, MD;  Location: [facility] OR;  Service: Orthopedics;  Laterality: Left;  DR Thurston Hole WANTS 90 MINUTES FOR THIS CASE  . TOTAL KNEE ARTHROPLASTY Right 2011  . TUBAL LIGATION  1982     OB History   No obstetric history on file.      Home Medications    Prior to Admission medications   Medication Sig Start Date End Date Taking? Authorizing Provider  alendronate (FOSAMAX) 70 MG tablet Take 70 mg by mouth every Sunday.  04/05/13   [provider]  ALPRAZolam Prudy Feeler) 1 MG tablet Take 1 tablet (1 mg total) by mouth 3 (three) times daily. Patient taking differently: Take 1 mg by  mouth at bedtime.  02/01/16   Erick Blinks, MD  aspirin EC 81 MG tablet Take 81 mg by mouth daily.    [provider]  atorvastatin (LIPITOR) 20 MG tablet Take 20 mg by mouth at bedtime. 09/14/15   [provider]  cephALEXin (KEFLEX) 500 MG capsule Take 1 capsule (500 mg total) by mouth 4 (four) times daily. 04/03/16   Vanetta Mulders, MD  ciprofloxacin (CIPRO) 500 MG tablet Take 1 tablet (500 mg total) by mouth 2 (two) times daily. 02/01/16   Erick Blinks, MD  furosemide (LASIX) 20 MG tablet Take 20 mg by mouth daily.    [provider]  HYDROcodone-acetaminophen (NORCO) 10-325 MG tablet Take 1 tablet by mouth at bedtime.    [provider]  levothyroxine (SYNTHROID, LEVOTHROID)  75 MCG tablet Take 75 mcg by mouth daily. 08/29/15   [provider]  meclizine (ANTIVERT) 25 MG tablet Take 1 tablet (25 mg total) by mouth 3 (three) times daily as needed for dizziness. 04/13/16   Lavera Guise, MD  vitamin B-12 (CYANOCOBALAMIN) 500 MCG tablet Take 500 mcg by mouth daily.    [provider]    Family History Family History  Problem Relation Age of Onset  . Heart attack Mother   . COPD Father   . Arthritis Sister   . Heart disease Brother     Social History Social History   Tobacco Use  . Smoking status: Current Every Day Smoker    Packs/day: 0.50    Years: 48.00    Pack years: 24.00    Types: Cigarettes    Last attempt to quit: 06/15/2012    Years since quitting: 6.1  . Smokeless tobacco: Never Used  . Tobacco comment: 12/29/14 1/2 PPD  Substance Use Topics  . Alcohol use: No    Alcohol/week: 0.0 standard drinks  . Drug use: No     Allergies   Iron; Oxycodone; and Sudafed [pseudoephedrine hcl]   Review of Systems Review of Systems ROS: Statement: All systems negative except as marked or noted in the HPI; Constitutional: Negative for fever and chills. +generalized weakness/fatigue. ; ; Eyes: Negative for eye pain, redness and discharge. ; ; ENMT: Negative for ear pain, hoarseness, nasal congestion, sinus pressure and sore throat. ; ; Cardiovascular: Negative for chest pain, palpitations, diaphoresis, dyspnea and peripheral edema. ; ; Respiratory: Negative for cough, wheezing and stridor. ; ; Gastrointestinal: Negative for nausea, vomiting, diarrhea, abdominal pain, blood in stool, hematemesis, jaundice and rectal bleeding. . ; ; Genitourinary: Negative for dysuria, flank pain and hematuria. ; ; Musculoskeletal: Negative for back pain and neck pain. Negative for swelling and trauma.; ; Skin: +bruise right hand. Negative for pruritus, rash, abrasions, blisters, and skin lesion.; ; Neuro: Negative for headache, lightheadedness and neck stiffness.  Negative for altered level of consciousness, altered mental status, extremity weakness, paresthesias, involuntary movement, seizure and syncope.       Physical Exam Updated Vital Signs BP (!) 112/57 (BP Location: Left Arm)   Pulse 90   Temp 97.6 F (36.4 C) (Oral)   Resp 18   Ht 4\' 10"  (1.473 m)   Wt 72.5 kg   SpO2 95%   BMI 33.41 kg/m    18:00 Orthostatic Vital Signs DF  Orthostatic Lying   BP- Lying: 121/61  Pulse- Lying: 80      Orthostatic Sitting  BP- Sitting: 125/78  Pulse- Sitting: 88      Orthostatic Standing at 0 minutes  BP-  Standing at 0 minutes: 136/68  Pulse- Standing at 0 minutes: 91    Physical Exam 1750: Physical examination:  Nursing notes reviewed; Vital signs and O2 SAT reviewed;  Constitutional: Well developed, Well nourished, Well hydrated, In no acute distress; Head:  Normocephalic, atraumatic; Eyes: EOMI, PERRL, No scleral icterus; ENMT: Mouth and pharynx normal, Mucous membranes moist; Neck: Supple, Full range of motion, No lymphadenopathy; Cardiovascular: Regular rate and rhythm, No gallop; Respiratory: Breath sounds clear & equal bilaterally, No wheezes.  Speaking full sentences with ease, Normal respiratory effort/excursion; Chest: Nontender, Movement normal; Abdomen: Soft, Nontender, Nondistended, Normal bowel sounds; Genitourinary: No CVA tenderness; Spine:  No midline CS, TS, LS tenderness.;; Extremities: Peripheral pulses normal, NT right shoulder/elbow/wrist/fingers. No right snuffbox tenderness, no pain to axial thumb or 3rd MCP loading.  Right forearm compartments soft, strong radial pp, brisk cap refill in fingers. Right hand NMS intact. +ecchymosis over right dorsal 4th MCP area, no deformity, minimal localized tenderness to palp, otherwise right hand NT to palp. +tr pedal edema bilat. No calf edema or asymmetry.; Neuro: AA&Ox3, Major CN grossly intact.  No facial droop. Speech clear. Grips equal. Strength 4-5/5 equal bilat UE's and LE's. No  apparent gross focal motor or sensory deficits in extremities.; Skin: Color normal, Warm, Dry.   ED Treatments / Results  Labs (all labs ordered are listed, but only abnormal results are displayed)   EKG None  Radiology   Procedures Procedures (including critical care time)  Medications Ordered in ED Medications - No data to display   Initial Impression / Assessment and Plan / ED Course  I have reviewed the triage vital signs and the nursing notes.  Pertinent labs & imaging results that were available during my care of the patient were reviewed by me and considered in my medical decision making (see chart for details).  MDM Reviewed: previous chart, nursing note and vitals Reviewed previous: labs, ECG and MRI Interpretation: labs, ECG, x-ray and CT scan    ED ECG REPORT   Date: 07/29/2018  Rate: 80  Rhythm: normal sinus rhythm  QRS Axis: normal  Intervals: normal  ST/T Wave abnormalities: normal  Conduction Disutrbances:none  Narrative Interpretation:   Old EKG Reviewed: unchanged; no significant changes from previous EKG dated 01/29/2016. I have personally reviewed the EKG tracing and agree with the computerized printout as noted.   Results for orders placed or performed during the hospital encounter of 07/29/18  Urinalysis, Routine w reflex microscopic  Result Value Ref Range   Color, Urine YELLOW YELLOW   APPearance CLOUDY (A) CLEAR   Specific Gravity, Urine 1.010 1.005 - 1.030   pH 5.0 5.0 - 8.0   Glucose, UA NEGATIVE NEGATIVE mg/dL   Hgb urine dipstick SMALL (A) NEGATIVE   Bilirubin Urine NEGATIVE NEGATIVE   Ketones, ur NEGATIVE NEGATIVE mg/dL   Protein, ur NEGATIVE NEGATIVE mg/dL   Nitrite NEGATIVE NEGATIVE   Leukocytes, UA LARGE (A) NEGATIVE   RBC / HPF 21-50 0 - 5 RBC/hpf   WBC, UA >50 (H) 0 - 5 WBC/hpf   Bacteria, UA RARE (A) NONE SEEN   Squamous Epithelial / LPF 0-5 0 - 5   WBC Clumps PRESENT    Hyaline Casts, UA PRESENT   Troponin I - Once   Result Value Ref Range   Troponin I <0.03 <0.03 ng/mL  CBC with Differential  Result Value Ref Range   WBC 6.8 4.0 - 10.5 K/uL   RBC 4.60 3.87 - 5.11 MIL/uL   Hemoglobin  13.7 12.0 - 15.0 g/dL   HCT 16.143.6 09.636.0 - 04.546.0 %   MCV 94.8 80.0 - 100.0 fL   MCH 29.8 26.0 - 34.0 pg   MCHC 31.4 30.0 - 36.0 g/dL   RDW 40.914.3 81.111.5 - 91.415.5 %   Platelets 267 150 - 400 K/uL   nRBC 0.0 0.0 - 0.2 %   Neutrophils Relative % 60 %   Neutro Abs 4.1 1.7 - 7.7 K/uL   Lymphocytes Relative 28 %   Lymphs Abs 1.9 0.7 - 4.0 K/uL   Monocytes Relative 8 %   Monocytes Absolute 0.5 0.1 - 1.0 K/uL   Eosinophils Relative 3 %   Eosinophils Absolute 0.2 0.0 - 0.5 K/uL   Basophils Relative 1 %   Basophils Absolute 0.0 0.0 - 0.1 K/uL   Immature Granulocytes 0 %   Abs Immature Granulocytes 0.01 0.00 - 0.07 K/uL  Urine rapid drug screen (hosp performed)  Result Value Ref Range   Opiates POSITIVE (A) NONE DETECTED   Cocaine NONE DETECTED NONE DETECTED   Benzodiazepines POSITIVE (A) NONE DETECTED   Amphetamines NONE DETECTED NONE DETECTED   Tetrahydrocannabinol NONE DETECTED NONE DETECTED   Barbiturates NONE DETECTED NONE DETECTED  Ethanol  Result Value Ref Range   Alcohol, Ethyl (B) <10 <10 mg/dL  Comprehensive metabolic panel  Result Value Ref Range   Sodium 141 135 - 145 mmol/L   Potassium 3.6 3.5 - 5.1 mmol/L   Chloride 104 98 - 111 mmol/L   CO2 27 22 - 32 mmol/L   Glucose, Bld 119 (H) 70 - 99 mg/dL   BUN 17 8 - 23 mg/dL   Creatinine, Ser 7.821.19 (H) 0.44 - 1.00 mg/dL   Calcium 9.2 8.9 - 95.610.3 mg/dL   Total Protein 7.1 6.5 - 8.1 g/dL   Albumin 3.8 3.5 - 5.0 g/dL   AST 22 15 - 41 U/L   ALT 27 0 - 44 U/L   Alkaline Phosphatase 66 38 - 126 U/L   Total Bilirubin 0.5 0.3 - 1.2 mg/dL   GFR calc non Af Amer 45 (L) >60 mL/min   GFR calc Af Amer 52 (L) >60 mL/min   Anion gap 10 5 - 15   Dg Chest 2 View Result Date: 07/29/2018 CLINICAL DATA:  Fall, right hand/wrist pain EXAM: CHEST - 2 VIEW COMPARISON:  04/13/2018  FINDINGS: Lungs are clear.  No pleural effusion or pneumothorax. The heart is normal in size. Mild degenerative changes of the visualized thoracolumbar spine. IMPRESSION: Normal chest radiographs. Electronically Signed   By: Charline BillsSriyesh  Krishnan M.D.   On: 07/29/2018 19:31   Dg Wrist Complete Right Result Date: 07/29/2018 CLINICAL DATA:  Fall, right hand/wrist pain EXAM: RIGHT WRIST - COMPLETE 3+ VIEW COMPARISON:  None. FINDINGS: No fracture or dislocation is seen. Mild degenerative changes of the 1st carpometacarpal joint. The visualized soft tissues are unremarkable. IMPRESSION: Negative. Electronically Signed   By: Charline BillsSriyesh  Krishnan M.D.   On: 07/29/2018 19:31   Ct Head Wo Contrast Result Date: 07/29/2018 CLINICAL DATA:  Fall EXAM: CT HEAD WITHOUT CONTRAST CT CERVICAL SPINE WITHOUT CONTRAST TECHNIQUE: Multidetector CT imaging of the head and cervical spine was performed following the standard protocol without intravenous contrast. Multiplanar CT image reconstructions of the cervical spine were also generated. COMPARISON:  MRI brain dated 07/27/2018 FINDINGS: CT HEAD FINDINGS Brain: No evidence of acute infarction, hemorrhage, extra-axial collection or mass lesion/mass effect. Subcortical white matter and periventricular small vessel ischemic changes. Global cortical and central atrophy. Secondary  ventricular prominence. Vascular: No hyperdense vessel or unexpected calcification. Skull: Normal. Negative for fracture or focal lesion. Sinuses/Orbits: The visualized paranasal sinuses are essentially clear. The mastoid air cells are unopacified. Other: None. CT CERVICAL SPINE FINDINGS Alignment: Straightening of the cervical spine, possibly positional. Skull base and vertebrae: No acute fracture. No primary bone lesion or focal pathologic process. Soft tissues and spinal canal: No prevertebral fluid or swelling. No visible canal hematoma. Disc levels: Mild to moderate degenerative changes of the mid cervical spine.  Spinal canal is patent. Upper chest: Visualized lung apices are clear. Other: Visualized thyroid is unremarkable. IMPRESSION: No evidence of acute intracranial abnormality. Atrophy with small vessel ischemic changes. No evidence of traumatic injury to the cervical spine. Mild to moderate degenerative changes. Electronically Signed   By: Charline Bills M.D.   On: 07/29/2018 19:30   Ct Cervical Spine Wo Contrast Result Date: 07/29/2018 CLINICAL DATA:  Fall EXAM: CT HEAD WITHOUT CONTRAST CT CERVICAL SPINE WITHOUT CONTRAST TECHNIQUE: Multidetector CT imaging of the head and cervical spine was performed following the standard protocol without intravenous contrast. Multiplanar CT image reconstructions of the cervical spine were also generated. COMPARISON:  MRI brain dated 07/27/2018 FINDINGS: CT HEAD FINDINGS Brain: No evidence of acute infarction, hemorrhage, extra-axial collection or mass lesion/mass effect. Subcortical white matter and periventricular small vessel ischemic changes. Global cortical and central atrophy. Secondary ventricular prominence. Vascular: No hyperdense vessel or unexpected calcification. Skull: Normal. Negative for fracture or focal lesion. Sinuses/Orbits: The visualized paranasal sinuses are essentially clear. The mastoid air cells are unopacified. Other: None. CT CERVICAL SPINE FINDINGS Alignment: Straightening of the cervical spine, possibly positional. Skull base and vertebrae: No acute fracture. No primary bone lesion or focal pathologic process. Soft tissues and spinal canal: No prevertebral fluid or swelling. No visible canal hematoma. Disc levels: Mild to moderate degenerative changes of the mid cervical spine. Spinal canal is patent. Upper chest: Visualized lung apices are clear. Other: Visualized thyroid is unremarkable. IMPRESSION: No evidence of acute intracranial abnormality. Atrophy with small vessel ischemic changes. No evidence of traumatic injury to the cervical spine. Mild  to moderate degenerative changes. Electronically Signed   By: Charline Bills M.D.   On: 07/29/2018 19:30    Dg Hand Complete Right Result Date: 07/29/2018 CLINICAL DATA:  Fall, right hand/wrist pain EXAM: RIGHT HAND - COMPLETE 3+ VIEW COMPARISON:  None. FINDINGS: No fracture or dislocation is seen. Mild irregularity at the radial base of the 2nd proximal metacarpal, without acute fracture. Mild degenerative changes of the 1st carpometacarpal joint. Mild soft tissue swelling overlying the dorsal aspect of the MCP joints. IMPRESSION: No fracture or dislocation is seen. Mild dorsal soft tissue swelling. Electronically Signed   By: Charline Bills M.D.   On: 07/29/2018 19:33    Mr Brain Wo Contrast Result Date: 07/27/2018 CLINICAL DATA:  Right-sided weakness since 04/2018. EXAM: MRI HEAD WITHOUT CONTRAST TECHNIQUE: Multiplanar, multiecho pulse sequences of the brain and surrounding structures were obtained without intravenous contrast. COMPARISON:  01/29/2016 FINDINGS: Brain: There is no evidence of acute infarct, mass, midline shift, or extra-axial fluid collection. There may be a single focus of chronic microhemorrhage in the posterior left parietal lobe, unchanged. Patchy T2 hyperintensities throughout the cerebral white matter bilaterally are unchanged and nonspecific but compatible with mild-to-moderate chronic small vessel ischemic disease. Mild to moderate lateral and third ventriculomegaly is unchanged. The fourth ventricle remains normal in size. There is no significant temporal horn dilatation. Chronic lacunar infarcts are again seen in the  basal ganglia bilaterally and in the left thalamus. Vascular: Major intracranial vascular flow voids are preserved. Skull and upper cervical spine: No suspicious marrow lesion. Sinuses/Orbits: Bilateral cataract extraction. Small right and trace left mastoid effusions. Other: None. IMPRESSION: 1. No acute intracranial abnormality. 2. Mild-to-moderate chronic  small vessel ischemic disease, unchanged. 3. Unchanged chronic ventriculomegaly which may reflect central predominant cerebral atrophy although normal pressure hydrocephalus is not excluded in the appropriate clinical setting. Electronically Signed   By: Sebastian Ache M.D.   On: 07/27/2018 12:30    2145:  Possible UTI on Udip; UC pending. After d/w family, will tx for same while UC pending. Workup otherwise reassuring. Not orthostatic on VS. Has tol PO well without N/V. No clear indication for admission at this time. Dx and testing d/w pt and family.  Questions answered.  Verb understanding, agreeable to d/c home with outpt f/u.        Final Clinical Impressions(s) / ED Diagnoses   Final diagnoses:  None    ED Discharge Orders    None       Samuel Jester, DO 08/02/18 1330

## 2018-07-29 NOTE — Discharge Instructions (Signed)
Take the prescription as directed.  Apply moist heat or ice to the area(s) of contusion/discomfort, for 15 minutes at a time, several times per day for the next few days.  Do not fall asleep on a heating or ice pack.  Call your regular medical doctor on Monday to schedule a follow up appointment this week.  Return to the Emergency Department immediately if worsening.

## 2018-07-29 NOTE — ED Triage Notes (Signed)
Pt's walker slipped out from in front of her, pt fell forward.  Bruising and swelling to rt hand, pt denies pain.

## 2018-07-30 ENCOUNTER — Telehealth: Payer: Self-pay

## 2018-07-30 DIAGNOSIS — W19XXXA Unspecified fall, initial encounter: Secondary | ICD-10-CM | POA: Diagnosis not present

## 2018-07-30 DIAGNOSIS — E785 Hyperlipidemia, unspecified: Secondary | ICD-10-CM | POA: Diagnosis not present

## 2018-07-30 DIAGNOSIS — I69398 Other sequelae of cerebral infarction: Secondary | ICD-10-CM | POA: Diagnosis not present

## 2018-07-30 DIAGNOSIS — J449 Chronic obstructive pulmonary disease, unspecified: Secondary | ICD-10-CM | POA: Diagnosis not present

## 2018-07-30 DIAGNOSIS — M21371 Foot drop, right foot: Secondary | ICD-10-CM | POA: Diagnosis not present

## 2018-07-30 DIAGNOSIS — M1990 Unspecified osteoarthritis, unspecified site: Secondary | ICD-10-CM | POA: Diagnosis not present

## 2018-07-30 DIAGNOSIS — M810 Age-related osteoporosis without current pathological fracture: Secondary | ICD-10-CM | POA: Diagnosis not present

## 2018-07-30 DIAGNOSIS — R5381 Other malaise: Secondary | ICD-10-CM | POA: Diagnosis not present

## 2018-07-30 DIAGNOSIS — E039 Hypothyroidism, unspecified: Secondary | ICD-10-CM | POA: Diagnosis not present

## 2018-07-30 DIAGNOSIS — R42 Dizziness and giddiness: Secondary | ICD-10-CM | POA: Diagnosis not present

## 2018-07-30 NOTE — Telephone Encounter (Signed)
-----   Message from [name], MD sent at 07/27/2018 12:49 PM EST ----- Please call [name] showed no acute findings and no new findings compared to prior MRI from 2017. As discussed, she can FU with PCP.  Amanda Armstrong

## 2018-07-30 NOTE — Telephone Encounter (Signed)
I called pt, spoke to pt's husband, Fayrene Fearing, per [facility]. I advised him of pt's MRI results. Pt's husband verbalized understanding and had no questions.

## 2018-08-01 DIAGNOSIS — J449 Chronic obstructive pulmonary disease, unspecified: Secondary | ICD-10-CM | POA: Diagnosis not present

## 2018-08-01 DIAGNOSIS — E039 Hypothyroidism, unspecified: Secondary | ICD-10-CM | POA: Diagnosis not present

## 2018-08-01 DIAGNOSIS — E785 Hyperlipidemia, unspecified: Secondary | ICD-10-CM | POA: Diagnosis not present

## 2018-08-01 DIAGNOSIS — I69398 Other sequelae of cerebral infarction: Secondary | ICD-10-CM | POA: Diagnosis not present

## 2018-08-01 DIAGNOSIS — R42 Dizziness and giddiness: Secondary | ICD-10-CM | POA: Diagnosis not present

## 2018-08-01 DIAGNOSIS — M810 Age-related osteoporosis without current pathological fracture: Secondary | ICD-10-CM | POA: Diagnosis not present

## 2018-08-01 DIAGNOSIS — M21371 Foot drop, right foot: Secondary | ICD-10-CM | POA: Diagnosis not present

## 2018-08-01 DIAGNOSIS — M1990 Unspecified osteoarthritis, unspecified site: Secondary | ICD-10-CM | POA: Diagnosis not present

## 2018-08-01 LAB — URINE CULTURE: Culture: >=100000 — AB

## 2018-08-02 ENCOUNTER — Telehealth: Payer: Self-pay

## 2018-08-02 NOTE — Telephone Encounter (Signed)
Post ED Visit - Positive Culture Follow-up  Culture report reviewed by antimicrobial stewardship pharmacist:  []  Enzo Bi, Pharm.D. []  Celedonio Miyamoto, Pharm.D., BCPS AQ-ID []  Garvin Fila, Pharm.D., BCPS []  Georgina Pillion, Pharm.D., BCPS []  [place], [address].D., BCPS, AAHIVP []  Estella Husk, Pharm.D., BCPS, AAHIVP []  Lysle Pearl, PharmD, BCPS []  Phillips Climes, PharmD, BCPS []  Agapito Games, PharmD, BCPS []  Verlan Friends, PharmD C Andi Devon Pharm D Positive urine culture Treated with Cephalexin, organism sensitive to the same and no further patient follow-up is required at this time.  Jerry Caras 08/02/2018, 8:38 AM

## 2018-08-06 DIAGNOSIS — E039 Hypothyroidism, unspecified: Secondary | ICD-10-CM | POA: Diagnosis not present

## 2018-08-06 DIAGNOSIS — I69398 Other sequelae of cerebral infarction: Secondary | ICD-10-CM | POA: Diagnosis not present

## 2018-08-06 DIAGNOSIS — E785 Hyperlipidemia, unspecified: Secondary | ICD-10-CM | POA: Diagnosis not present

## 2018-08-06 DIAGNOSIS — M1990 Unspecified osteoarthritis, unspecified site: Secondary | ICD-10-CM | POA: Diagnosis not present

## 2018-08-06 DIAGNOSIS — M21371 Foot drop, right foot: Secondary | ICD-10-CM | POA: Diagnosis not present

## 2018-08-06 DIAGNOSIS — J449 Chronic obstructive pulmonary disease, unspecified: Secondary | ICD-10-CM | POA: Diagnosis not present

## 2018-08-06 DIAGNOSIS — R42 Dizziness and giddiness: Secondary | ICD-10-CM | POA: Diagnosis not present

## 2018-08-06 DIAGNOSIS — M810 Age-related osteoporosis without current pathological fracture: Secondary | ICD-10-CM | POA: Diagnosis not present

## 2018-08-08 DIAGNOSIS — I69398 Other sequelae of cerebral infarction: Secondary | ICD-10-CM | POA: Diagnosis not present

## 2018-08-08 DIAGNOSIS — J449 Chronic obstructive pulmonary disease, unspecified: Secondary | ICD-10-CM | POA: Diagnosis not present

## 2018-08-08 DIAGNOSIS — M1990 Unspecified osteoarthritis, unspecified site: Secondary | ICD-10-CM | POA: Diagnosis not present

## 2018-08-08 DIAGNOSIS — M21371 Foot drop, right foot: Secondary | ICD-10-CM | POA: Diagnosis not present

## 2018-08-08 DIAGNOSIS — E785 Hyperlipidemia, unspecified: Secondary | ICD-10-CM | POA: Diagnosis not present

## 2018-08-08 DIAGNOSIS — R42 Dizziness and giddiness: Secondary | ICD-10-CM | POA: Diagnosis not present

## 2018-08-08 DIAGNOSIS — E039 Hypothyroidism, unspecified: Secondary | ICD-10-CM | POA: Diagnosis not present

## 2018-08-08 DIAGNOSIS — M810 Age-related osteoporosis without current pathological fracture: Secondary | ICD-10-CM | POA: Diagnosis not present

## 2018-08-13 DIAGNOSIS — M1990 Unspecified osteoarthritis, unspecified site: Secondary | ICD-10-CM | POA: Diagnosis not present

## 2018-08-13 DIAGNOSIS — R42 Dizziness and giddiness: Secondary | ICD-10-CM | POA: Diagnosis not present

## 2018-08-13 DIAGNOSIS — J449 Chronic obstructive pulmonary disease, unspecified: Secondary | ICD-10-CM | POA: Diagnosis not present

## 2018-08-13 DIAGNOSIS — E039 Hypothyroidism, unspecified: Secondary | ICD-10-CM | POA: Diagnosis not present

## 2018-08-13 DIAGNOSIS — M810 Age-related osteoporosis without current pathological fracture: Secondary | ICD-10-CM | POA: Diagnosis not present

## 2018-08-13 DIAGNOSIS — I69398 Other sequelae of cerebral infarction: Secondary | ICD-10-CM | POA: Diagnosis not present

## 2018-08-13 DIAGNOSIS — M21371 Foot drop, right foot: Secondary | ICD-10-CM | POA: Diagnosis not present

## 2018-08-13 DIAGNOSIS — E785 Hyperlipidemia, unspecified: Secondary | ICD-10-CM | POA: Diagnosis not present

## 2018-08-15 ENCOUNTER — Ambulatory Visit: Payer: Medicare HMO | Admitting: Neurology

## 2018-08-15 ENCOUNTER — Encounter

## 2018-08-15 DIAGNOSIS — M810 Age-related osteoporosis without current pathological fracture: Secondary | ICD-10-CM | POA: Diagnosis not present

## 2018-08-15 DIAGNOSIS — M21371 Foot drop, right foot: Secondary | ICD-10-CM | POA: Diagnosis not present

## 2018-08-15 DIAGNOSIS — I69398 Other sequelae of cerebral infarction: Secondary | ICD-10-CM | POA: Diagnosis not present

## 2018-08-15 DIAGNOSIS — M1990 Unspecified osteoarthritis, unspecified site: Secondary | ICD-10-CM | POA: Diagnosis not present

## 2018-08-15 DIAGNOSIS — E039 Hypothyroidism, unspecified: Secondary | ICD-10-CM | POA: Diagnosis not present

## 2018-08-15 DIAGNOSIS — J449 Chronic obstructive pulmonary disease, unspecified: Secondary | ICD-10-CM | POA: Diagnosis not present

## 2018-08-15 DIAGNOSIS — E785 Hyperlipidemia, unspecified: Secondary | ICD-10-CM | POA: Diagnosis not present

## 2018-08-15 DIAGNOSIS — R42 Dizziness and giddiness: Secondary | ICD-10-CM | POA: Diagnosis not present

## 2018-08-22 DIAGNOSIS — E785 Hyperlipidemia, unspecified: Secondary | ICD-10-CM | POA: Diagnosis not present

## 2018-08-22 DIAGNOSIS — E039 Hypothyroidism, unspecified: Secondary | ICD-10-CM | POA: Diagnosis not present

## 2018-08-22 DIAGNOSIS — I69398 Other sequelae of cerebral infarction: Secondary | ICD-10-CM | POA: Diagnosis not present

## 2018-08-22 DIAGNOSIS — J449 Chronic obstructive pulmonary disease, unspecified: Secondary | ICD-10-CM | POA: Diagnosis not present

## 2018-08-22 DIAGNOSIS — M810 Age-related osteoporosis without current pathological fracture: Secondary | ICD-10-CM | POA: Diagnosis not present

## 2018-08-22 DIAGNOSIS — R42 Dizziness and giddiness: Secondary | ICD-10-CM | POA: Diagnosis not present

## 2018-08-22 DIAGNOSIS — M21371 Foot drop, right foot: Secondary | ICD-10-CM | POA: Diagnosis not present

## 2018-08-22 DIAGNOSIS — M1990 Unspecified osteoarthritis, unspecified site: Secondary | ICD-10-CM | POA: Diagnosis not present

## 2018-08-24 DIAGNOSIS — E785 Hyperlipidemia, unspecified: Secondary | ICD-10-CM | POA: Diagnosis not present

## 2018-08-24 DIAGNOSIS — M21371 Foot drop, right foot: Secondary | ICD-10-CM | POA: Diagnosis not present

## 2018-08-24 DIAGNOSIS — M1990 Unspecified osteoarthritis, unspecified site: Secondary | ICD-10-CM | POA: Diagnosis not present

## 2018-08-24 DIAGNOSIS — M810 Age-related osteoporosis without current pathological fracture: Secondary | ICD-10-CM | POA: Diagnosis not present

## 2018-08-24 DIAGNOSIS — R42 Dizziness and giddiness: Secondary | ICD-10-CM | POA: Diagnosis not present

## 2018-08-24 DIAGNOSIS — J449 Chronic obstructive pulmonary disease, unspecified: Secondary | ICD-10-CM | POA: Diagnosis not present

## 2018-08-24 DIAGNOSIS — I69398 Other sequelae of cerebral infarction: Secondary | ICD-10-CM | POA: Diagnosis not present

## 2018-08-24 DIAGNOSIS — E039 Hypothyroidism, unspecified: Secondary | ICD-10-CM | POA: Diagnosis not present

## 2018-08-26 DIAGNOSIS — R52 Pain, unspecified: Secondary | ICD-10-CM | POA: Diagnosis not present

## 2018-08-26 DIAGNOSIS — R0902 Hypoxemia: Secondary | ICD-10-CM | POA: Diagnosis not present

## 2018-08-26 DIAGNOSIS — K5900 Constipation, unspecified: Secondary | ICD-10-CM | POA: Diagnosis not present

## 2018-08-26 DIAGNOSIS — R1084 Generalized abdominal pain: Secondary | ICD-10-CM | POA: Diagnosis not present

## 2018-08-27 DIAGNOSIS — I69398 Other sequelae of cerebral infarction: Secondary | ICD-10-CM | POA: Diagnosis not present

## 2018-08-27 DIAGNOSIS — J449 Chronic obstructive pulmonary disease, unspecified: Secondary | ICD-10-CM | POA: Diagnosis not present

## 2018-08-27 DIAGNOSIS — R42 Dizziness and giddiness: Secondary | ICD-10-CM | POA: Diagnosis not present

## 2018-08-27 DIAGNOSIS — M1990 Unspecified osteoarthritis, unspecified site: Secondary | ICD-10-CM | POA: Diagnosis not present

## 2018-08-27 DIAGNOSIS — M21371 Foot drop, right foot: Secondary | ICD-10-CM | POA: Diagnosis not present

## 2018-08-27 DIAGNOSIS — M810 Age-related osteoporosis without current pathological fracture: Secondary | ICD-10-CM | POA: Diagnosis not present

## 2018-08-27 DIAGNOSIS — E785 Hyperlipidemia, unspecified: Secondary | ICD-10-CM | POA: Diagnosis not present

## 2018-08-27 DIAGNOSIS — E039 Hypothyroidism, unspecified: Secondary | ICD-10-CM | POA: Diagnosis not present

## 2018-08-29 DIAGNOSIS — M1990 Unspecified osteoarthritis, unspecified site: Secondary | ICD-10-CM | POA: Diagnosis not present

## 2018-08-29 DIAGNOSIS — J449 Chronic obstructive pulmonary disease, unspecified: Secondary | ICD-10-CM | POA: Diagnosis not present

## 2018-08-29 DIAGNOSIS — M21371 Foot drop, right foot: Secondary | ICD-10-CM | POA: Diagnosis not present

## 2018-08-29 DIAGNOSIS — E039 Hypothyroidism, unspecified: Secondary | ICD-10-CM | POA: Diagnosis not present

## 2018-08-29 DIAGNOSIS — E785 Hyperlipidemia, unspecified: Secondary | ICD-10-CM | POA: Diagnosis not present

## 2018-08-29 DIAGNOSIS — R42 Dizziness and giddiness: Secondary | ICD-10-CM | POA: Diagnosis not present

## 2018-08-29 DIAGNOSIS — M810 Age-related osteoporosis without current pathological fracture: Secondary | ICD-10-CM | POA: Diagnosis not present

## 2018-08-29 DIAGNOSIS — I69398 Other sequelae of cerebral infarction: Secondary | ICD-10-CM | POA: Diagnosis not present

## 2018-08-30 DIAGNOSIS — J449 Chronic obstructive pulmonary disease, unspecified: Secondary | ICD-10-CM | POA: Diagnosis not present

## 2018-09-03 DIAGNOSIS — M810 Age-related osteoporosis without current pathological fracture: Secondary | ICD-10-CM | POA: Diagnosis not present

## 2018-09-03 DIAGNOSIS — E785 Hyperlipidemia, unspecified: Secondary | ICD-10-CM | POA: Diagnosis not present

## 2018-09-03 DIAGNOSIS — M21371 Foot drop, right foot: Secondary | ICD-10-CM | POA: Diagnosis not present

## 2018-09-03 DIAGNOSIS — R42 Dizziness and giddiness: Secondary | ICD-10-CM | POA: Diagnosis not present

## 2018-09-03 DIAGNOSIS — J449 Chronic obstructive pulmonary disease, unspecified: Secondary | ICD-10-CM | POA: Diagnosis not present

## 2018-09-03 DIAGNOSIS — I69398 Other sequelae of cerebral infarction: Secondary | ICD-10-CM | POA: Diagnosis not present

## 2018-09-03 DIAGNOSIS — M1990 Unspecified osteoarthritis, unspecified site: Secondary | ICD-10-CM | POA: Diagnosis not present

## 2018-09-03 DIAGNOSIS — E039 Hypothyroidism, unspecified: Secondary | ICD-10-CM | POA: Diagnosis not present

## 2018-09-05 DIAGNOSIS — E785 Hyperlipidemia, unspecified: Secondary | ICD-10-CM | POA: Diagnosis not present

## 2018-09-05 DIAGNOSIS — I69398 Other sequelae of cerebral infarction: Secondary | ICD-10-CM | POA: Diagnosis not present

## 2018-09-05 DIAGNOSIS — M21371 Foot drop, right foot: Secondary | ICD-10-CM | POA: Diagnosis not present

## 2018-09-05 DIAGNOSIS — E039 Hypothyroidism, unspecified: Secondary | ICD-10-CM | POA: Diagnosis not present

## 2018-09-05 DIAGNOSIS — M1990 Unspecified osteoarthritis, unspecified site: Secondary | ICD-10-CM | POA: Diagnosis not present

## 2018-09-05 DIAGNOSIS — Z79891 Long term (current) use of opiate analgesic: Secondary | ICD-10-CM | POA: Insufficient documentation

## 2018-09-05 DIAGNOSIS — M810 Age-related osteoporosis without current pathological fracture: Secondary | ICD-10-CM | POA: Diagnosis not present

## 2018-09-05 DIAGNOSIS — R42 Dizziness and giddiness: Secondary | ICD-10-CM | POA: Diagnosis not present

## 2018-09-05 DIAGNOSIS — J449 Chronic obstructive pulmonary disease, unspecified: Secondary | ICD-10-CM | POA: Diagnosis not present

## 2018-09-10 DIAGNOSIS — J449 Chronic obstructive pulmonary disease, unspecified: Secondary | ICD-10-CM | POA: Diagnosis not present

## 2018-09-10 DIAGNOSIS — Z7982 Long term (current) use of aspirin: Secondary | ICD-10-CM | POA: Diagnosis not present

## 2018-09-10 DIAGNOSIS — E785 Hyperlipidemia, unspecified: Secondary | ICD-10-CM | POA: Diagnosis not present

## 2018-09-10 DIAGNOSIS — M21371 Foot drop, right foot: Secondary | ICD-10-CM | POA: Diagnosis not present

## 2018-09-10 DIAGNOSIS — R42 Dizziness and giddiness: Secondary | ICD-10-CM | POA: Diagnosis not present

## 2018-09-10 DIAGNOSIS — E039 Hypothyroidism, unspecified: Secondary | ICD-10-CM | POA: Diagnosis not present

## 2018-09-10 DIAGNOSIS — I69398 Other sequelae of cerebral infarction: Secondary | ICD-10-CM | POA: Diagnosis not present

## 2018-09-10 DIAGNOSIS — M1990 Unspecified osteoarthritis, unspecified site: Secondary | ICD-10-CM | POA: Diagnosis not present

## 2018-09-10 DIAGNOSIS — Z79891 Long term (current) use of opiate analgesic: Secondary | ICD-10-CM | POA: Diagnosis not present

## 2018-09-10 DIAGNOSIS — M810 Age-related osteoporosis without current pathological fracture: Secondary | ICD-10-CM | POA: Diagnosis not present

## 2018-09-10 DIAGNOSIS — Z792 Long term (current) use of antibiotics: Secondary | ICD-10-CM | POA: Diagnosis not present

## 2018-09-12 DIAGNOSIS — I69398 Other sequelae of cerebral infarction: Secondary | ICD-10-CM | POA: Diagnosis not present

## 2018-09-12 DIAGNOSIS — Z792 Long term (current) use of antibiotics: Secondary | ICD-10-CM | POA: Diagnosis not present

## 2018-09-12 DIAGNOSIS — M810 Age-related osteoporosis without current pathological fracture: Secondary | ICD-10-CM | POA: Diagnosis not present

## 2018-09-12 DIAGNOSIS — R42 Dizziness and giddiness: Secondary | ICD-10-CM | POA: Diagnosis not present

## 2018-09-12 DIAGNOSIS — M1990 Unspecified osteoarthritis, unspecified site: Secondary | ICD-10-CM | POA: Diagnosis not present

## 2018-09-12 DIAGNOSIS — Z79891 Long term (current) use of opiate analgesic: Secondary | ICD-10-CM | POA: Diagnosis not present

## 2018-09-12 DIAGNOSIS — E039 Hypothyroidism, unspecified: Secondary | ICD-10-CM | POA: Diagnosis not present

## 2018-09-12 DIAGNOSIS — M21371 Foot drop, right foot: Secondary | ICD-10-CM | POA: Diagnosis not present

## 2018-09-12 DIAGNOSIS — Z7982 Long term (current) use of aspirin: Secondary | ICD-10-CM | POA: Diagnosis not present

## 2018-09-12 DIAGNOSIS — E785 Hyperlipidemia, unspecified: Secondary | ICD-10-CM | POA: Diagnosis not present

## 2018-09-12 DIAGNOSIS — J449 Chronic obstructive pulmonary disease, unspecified: Secondary | ICD-10-CM | POA: Diagnosis not present

## 2018-09-17 DIAGNOSIS — M810 Age-related osteoporosis without current pathological fracture: Secondary | ICD-10-CM | POA: Diagnosis not present

## 2018-09-17 DIAGNOSIS — M21371 Foot drop, right foot: Secondary | ICD-10-CM | POA: Diagnosis not present

## 2018-09-17 DIAGNOSIS — M1990 Unspecified osteoarthritis, unspecified site: Secondary | ICD-10-CM | POA: Diagnosis not present

## 2018-09-17 DIAGNOSIS — I69398 Other sequelae of cerebral infarction: Secondary | ICD-10-CM | POA: Diagnosis not present

## 2018-09-17 DIAGNOSIS — Z79891 Long term (current) use of opiate analgesic: Secondary | ICD-10-CM | POA: Diagnosis not present

## 2018-09-17 DIAGNOSIS — Z792 Long term (current) use of antibiotics: Secondary | ICD-10-CM | POA: Diagnosis not present

## 2018-09-17 DIAGNOSIS — R42 Dizziness and giddiness: Secondary | ICD-10-CM | POA: Diagnosis not present

## 2018-09-17 DIAGNOSIS — J449 Chronic obstructive pulmonary disease, unspecified: Secondary | ICD-10-CM | POA: Diagnosis not present

## 2018-09-17 DIAGNOSIS — E039 Hypothyroidism, unspecified: Secondary | ICD-10-CM | POA: Diagnosis not present

## 2018-09-17 DIAGNOSIS — E785 Hyperlipidemia, unspecified: Secondary | ICD-10-CM | POA: Diagnosis not present

## 2018-09-17 DIAGNOSIS — Z7982 Long term (current) use of aspirin: Secondary | ICD-10-CM | POA: Diagnosis not present

## 2018-09-19 DIAGNOSIS — E785 Hyperlipidemia, unspecified: Secondary | ICD-10-CM | POA: Diagnosis not present

## 2018-09-19 DIAGNOSIS — Z792 Long term (current) use of antibiotics: Secondary | ICD-10-CM | POA: Diagnosis not present

## 2018-09-19 DIAGNOSIS — M1990 Unspecified osteoarthritis, unspecified site: Secondary | ICD-10-CM | POA: Diagnosis not present

## 2018-09-19 DIAGNOSIS — I69398 Other sequelae of cerebral infarction: Secondary | ICD-10-CM | POA: Diagnosis not present

## 2018-09-19 DIAGNOSIS — J449 Chronic obstructive pulmonary disease, unspecified: Secondary | ICD-10-CM | POA: Diagnosis not present

## 2018-09-19 DIAGNOSIS — E039 Hypothyroidism, unspecified: Secondary | ICD-10-CM | POA: Diagnosis not present

## 2018-09-19 DIAGNOSIS — Z7982 Long term (current) use of aspirin: Secondary | ICD-10-CM | POA: Diagnosis not present

## 2018-09-19 DIAGNOSIS — M21371 Foot drop, right foot: Secondary | ICD-10-CM | POA: Diagnosis not present

## 2018-09-19 DIAGNOSIS — R42 Dizziness and giddiness: Secondary | ICD-10-CM | POA: Diagnosis not present

## 2018-09-19 DIAGNOSIS — M810 Age-related osteoporosis without current pathological fracture: Secondary | ICD-10-CM | POA: Diagnosis not present

## 2018-09-19 DIAGNOSIS — Z79891 Long term (current) use of opiate analgesic: Secondary | ICD-10-CM | POA: Diagnosis not present

## 2018-09-24 DIAGNOSIS — Z79891 Long term (current) use of opiate analgesic: Secondary | ICD-10-CM | POA: Diagnosis not present

## 2018-09-24 DIAGNOSIS — I69398 Other sequelae of cerebral infarction: Secondary | ICD-10-CM | POA: Diagnosis not present

## 2018-09-24 DIAGNOSIS — M810 Age-related osteoporosis without current pathological fracture: Secondary | ICD-10-CM | POA: Diagnosis not present

## 2018-09-24 DIAGNOSIS — J449 Chronic obstructive pulmonary disease, unspecified: Secondary | ICD-10-CM | POA: Diagnosis not present

## 2018-09-24 DIAGNOSIS — Z7982 Long term (current) use of aspirin: Secondary | ICD-10-CM | POA: Diagnosis not present

## 2018-09-24 DIAGNOSIS — E039 Hypothyroidism, unspecified: Secondary | ICD-10-CM | POA: Diagnosis not present

## 2018-09-24 DIAGNOSIS — Z792 Long term (current) use of antibiotics: Secondary | ICD-10-CM | POA: Diagnosis not present

## 2018-09-24 DIAGNOSIS — M1990 Unspecified osteoarthritis, unspecified site: Secondary | ICD-10-CM | POA: Diagnosis not present

## 2018-09-24 DIAGNOSIS — M21371 Foot drop, right foot: Secondary | ICD-10-CM | POA: Diagnosis not present

## 2018-09-24 DIAGNOSIS — R42 Dizziness and giddiness: Secondary | ICD-10-CM | POA: Diagnosis not present

## 2018-09-24 DIAGNOSIS — E785 Hyperlipidemia, unspecified: Secondary | ICD-10-CM | POA: Diagnosis not present

## 2018-09-26 DIAGNOSIS — Z792 Long term (current) use of antibiotics: Secondary | ICD-10-CM | POA: Diagnosis not present

## 2018-09-26 DIAGNOSIS — I69398 Other sequelae of cerebral infarction: Secondary | ICD-10-CM | POA: Diagnosis not present

## 2018-09-26 DIAGNOSIS — M810 Age-related osteoporosis without current pathological fracture: Secondary | ICD-10-CM | POA: Diagnosis not present

## 2018-09-26 DIAGNOSIS — E785 Hyperlipidemia, unspecified: Secondary | ICD-10-CM | POA: Diagnosis not present

## 2018-09-26 DIAGNOSIS — E039 Hypothyroidism, unspecified: Secondary | ICD-10-CM | POA: Diagnosis not present

## 2018-09-26 DIAGNOSIS — Z79891 Long term (current) use of opiate analgesic: Secondary | ICD-10-CM | POA: Diagnosis not present

## 2018-09-26 DIAGNOSIS — R42 Dizziness and giddiness: Secondary | ICD-10-CM | POA: Diagnosis not present

## 2018-09-26 DIAGNOSIS — M21371 Foot drop, right foot: Secondary | ICD-10-CM | POA: Diagnosis not present

## 2018-09-26 DIAGNOSIS — M1990 Unspecified osteoarthritis, unspecified site: Secondary | ICD-10-CM | POA: Diagnosis not present

## 2018-09-26 DIAGNOSIS — J449 Chronic obstructive pulmonary disease, unspecified: Secondary | ICD-10-CM | POA: Diagnosis not present

## 2018-09-26 DIAGNOSIS — Z7982 Long term (current) use of aspirin: Secondary | ICD-10-CM | POA: Diagnosis not present

## 2018-09-28 DIAGNOSIS — G4700 Insomnia, unspecified: Secondary | ICD-10-CM | POA: Diagnosis not present

## 2018-10-01 DIAGNOSIS — M810 Age-related osteoporosis without current pathological fracture: Secondary | ICD-10-CM | POA: Diagnosis not present

## 2018-10-01 DIAGNOSIS — M1990 Unspecified osteoarthritis, unspecified site: Secondary | ICD-10-CM | POA: Diagnosis not present

## 2018-10-01 DIAGNOSIS — Z79891 Long term (current) use of opiate analgesic: Secondary | ICD-10-CM | POA: Diagnosis not present

## 2018-10-01 DIAGNOSIS — Z7982 Long term (current) use of aspirin: Secondary | ICD-10-CM | POA: Diagnosis not present

## 2018-10-01 DIAGNOSIS — E039 Hypothyroidism, unspecified: Secondary | ICD-10-CM | POA: Diagnosis not present

## 2018-10-01 DIAGNOSIS — Z792 Long term (current) use of antibiotics: Secondary | ICD-10-CM | POA: Diagnosis not present

## 2018-10-01 DIAGNOSIS — I69398 Other sequelae of cerebral infarction: Secondary | ICD-10-CM | POA: Diagnosis not present

## 2018-10-01 DIAGNOSIS — E785 Hyperlipidemia, unspecified: Secondary | ICD-10-CM | POA: Diagnosis not present

## 2018-10-01 DIAGNOSIS — M21371 Foot drop, right foot: Secondary | ICD-10-CM | POA: Diagnosis not present

## 2018-10-01 DIAGNOSIS — R42 Dizziness and giddiness: Secondary | ICD-10-CM | POA: Diagnosis not present

## 2018-10-01 DIAGNOSIS — J449 Chronic obstructive pulmonary disease, unspecified: Secondary | ICD-10-CM | POA: Diagnosis not present

## 2018-10-04 DIAGNOSIS — B354 Tinea corporis: Secondary | ICD-10-CM | POA: Diagnosis not present

## 2018-10-04 DIAGNOSIS — J449 Chronic obstructive pulmonary disease, unspecified: Secondary | ICD-10-CM | POA: Diagnosis not present

## 2018-10-04 DIAGNOSIS — B351 Tinea unguium: Secondary | ICD-10-CM | POA: Diagnosis not present

## 2018-10-10 DIAGNOSIS — M21371 Foot drop, right foot: Secondary | ICD-10-CM | POA: Diagnosis not present

## 2018-10-10 DIAGNOSIS — M810 Age-related osteoporosis without current pathological fracture: Secondary | ICD-10-CM | POA: Diagnosis not present

## 2018-10-10 DIAGNOSIS — R42 Dizziness and giddiness: Secondary | ICD-10-CM | POA: Diagnosis not present

## 2018-10-10 DIAGNOSIS — Z792 Long term (current) use of antibiotics: Secondary | ICD-10-CM | POA: Diagnosis not present

## 2018-10-10 DIAGNOSIS — J449 Chronic obstructive pulmonary disease, unspecified: Secondary | ICD-10-CM | POA: Diagnosis not present

## 2018-10-10 DIAGNOSIS — E039 Hypothyroidism, unspecified: Secondary | ICD-10-CM | POA: Diagnosis not present

## 2018-10-10 DIAGNOSIS — Z79891 Long term (current) use of opiate analgesic: Secondary | ICD-10-CM | POA: Diagnosis not present

## 2018-10-10 DIAGNOSIS — I69398 Other sequelae of cerebral infarction: Secondary | ICD-10-CM | POA: Diagnosis not present

## 2018-10-10 DIAGNOSIS — M1990 Unspecified osteoarthritis, unspecified site: Secondary | ICD-10-CM | POA: Diagnosis not present

## 2018-10-10 DIAGNOSIS — Z7982 Long term (current) use of aspirin: Secondary | ICD-10-CM | POA: Diagnosis not present

## 2018-10-10 DIAGNOSIS — E785 Hyperlipidemia, unspecified: Secondary | ICD-10-CM | POA: Diagnosis not present

## 2018-10-17 DIAGNOSIS — R42 Dizziness and giddiness: Secondary | ICD-10-CM | POA: Diagnosis not present

## 2018-10-17 DIAGNOSIS — E039 Hypothyroidism, unspecified: Secondary | ICD-10-CM | POA: Diagnosis not present

## 2018-10-17 DIAGNOSIS — Z7982 Long term (current) use of aspirin: Secondary | ICD-10-CM | POA: Diagnosis not present

## 2018-10-17 DIAGNOSIS — I69398 Other sequelae of cerebral infarction: Secondary | ICD-10-CM | POA: Diagnosis not present

## 2018-10-17 DIAGNOSIS — M810 Age-related osteoporosis without current pathological fracture: Secondary | ICD-10-CM | POA: Diagnosis not present

## 2018-10-17 DIAGNOSIS — J449 Chronic obstructive pulmonary disease, unspecified: Secondary | ICD-10-CM | POA: Diagnosis not present

## 2018-10-17 DIAGNOSIS — M21371 Foot drop, right foot: Secondary | ICD-10-CM | POA: Diagnosis not present

## 2018-10-17 DIAGNOSIS — Z792 Long term (current) use of antibiotics: Secondary | ICD-10-CM | POA: Diagnosis not present

## 2018-10-17 DIAGNOSIS — M1990 Unspecified osteoarthritis, unspecified site: Secondary | ICD-10-CM | POA: Diagnosis not present

## 2018-10-17 DIAGNOSIS — E785 Hyperlipidemia, unspecified: Secondary | ICD-10-CM | POA: Diagnosis not present

## 2018-10-17 DIAGNOSIS — Z79891 Long term (current) use of opiate analgesic: Secondary | ICD-10-CM | POA: Diagnosis not present

## 2018-10-24 DIAGNOSIS — E785 Hyperlipidemia, unspecified: Secondary | ICD-10-CM | POA: Diagnosis not present

## 2018-10-24 DIAGNOSIS — J449 Chronic obstructive pulmonary disease, unspecified: Secondary | ICD-10-CM | POA: Diagnosis not present

## 2018-10-24 DIAGNOSIS — Z7982 Long term (current) use of aspirin: Secondary | ICD-10-CM | POA: Diagnosis not present

## 2018-10-24 DIAGNOSIS — I69398 Other sequelae of cerebral infarction: Secondary | ICD-10-CM | POA: Diagnosis not present

## 2018-10-24 DIAGNOSIS — Z792 Long term (current) use of antibiotics: Secondary | ICD-10-CM | POA: Diagnosis not present

## 2018-10-24 DIAGNOSIS — M21371 Foot drop, right foot: Secondary | ICD-10-CM | POA: Diagnosis not present

## 2018-10-24 DIAGNOSIS — M810 Age-related osteoporosis without current pathological fracture: Secondary | ICD-10-CM | POA: Diagnosis not present

## 2018-10-24 DIAGNOSIS — Z79891 Long term (current) use of opiate analgesic: Secondary | ICD-10-CM | POA: Diagnosis not present

## 2018-10-24 DIAGNOSIS — M1990 Unspecified osteoarthritis, unspecified site: Secondary | ICD-10-CM | POA: Diagnosis not present

## 2018-10-24 DIAGNOSIS — E039 Hypothyroidism, unspecified: Secondary | ICD-10-CM | POA: Diagnosis not present

## 2018-10-24 DIAGNOSIS — R42 Dizziness and giddiness: Secondary | ICD-10-CM | POA: Diagnosis not present

## 2018-10-29 DIAGNOSIS — I69398 Other sequelae of cerebral infarction: Secondary | ICD-10-CM | POA: Diagnosis not present

## 2018-10-29 DIAGNOSIS — R42 Dizziness and giddiness: Secondary | ICD-10-CM | POA: Diagnosis not present

## 2018-10-29 DIAGNOSIS — M810 Age-related osteoporosis without current pathological fracture: Secondary | ICD-10-CM | POA: Diagnosis not present

## 2018-10-29 DIAGNOSIS — E785 Hyperlipidemia, unspecified: Secondary | ICD-10-CM | POA: Diagnosis not present

## 2018-10-29 DIAGNOSIS — M1990 Unspecified osteoarthritis, unspecified site: Secondary | ICD-10-CM | POA: Diagnosis not present

## 2018-10-29 DIAGNOSIS — J449 Chronic obstructive pulmonary disease, unspecified: Secondary | ICD-10-CM | POA: Diagnosis not present

## 2018-10-29 DIAGNOSIS — E039 Hypothyroidism, unspecified: Secondary | ICD-10-CM | POA: Diagnosis not present

## 2018-10-29 DIAGNOSIS — Z792 Long term (current) use of antibiotics: Secondary | ICD-10-CM | POA: Diagnosis not present

## 2018-10-29 DIAGNOSIS — Z7982 Long term (current) use of aspirin: Secondary | ICD-10-CM | POA: Diagnosis not present

## 2018-10-29 DIAGNOSIS — Z79891 Long term (current) use of opiate analgesic: Secondary | ICD-10-CM | POA: Diagnosis not present

## 2018-10-29 DIAGNOSIS — M21371 Foot drop, right foot: Secondary | ICD-10-CM | POA: Diagnosis not present

## 2018-10-31 DIAGNOSIS — Z7982 Long term (current) use of aspirin: Secondary | ICD-10-CM | POA: Diagnosis not present

## 2018-10-31 DIAGNOSIS — M1990 Unspecified osteoarthritis, unspecified site: Secondary | ICD-10-CM | POA: Diagnosis not present

## 2018-10-31 DIAGNOSIS — Z792 Long term (current) use of antibiotics: Secondary | ICD-10-CM | POA: Diagnosis not present

## 2018-10-31 DIAGNOSIS — E039 Hypothyroidism, unspecified: Secondary | ICD-10-CM | POA: Diagnosis not present

## 2018-10-31 DIAGNOSIS — M810 Age-related osteoporosis without current pathological fracture: Secondary | ICD-10-CM | POA: Diagnosis not present

## 2018-10-31 DIAGNOSIS — M21371 Foot drop, right foot: Secondary | ICD-10-CM | POA: Diagnosis not present

## 2018-10-31 DIAGNOSIS — E785 Hyperlipidemia, unspecified: Secondary | ICD-10-CM | POA: Diagnosis not present

## 2018-10-31 DIAGNOSIS — I69398 Other sequelae of cerebral infarction: Secondary | ICD-10-CM | POA: Diagnosis not present

## 2018-10-31 DIAGNOSIS — J449 Chronic obstructive pulmonary disease, unspecified: Secondary | ICD-10-CM | POA: Diagnosis not present

## 2018-10-31 DIAGNOSIS — Z9181 History of falling: Secondary | ICD-10-CM | POA: Insufficient documentation

## 2018-10-31 DIAGNOSIS — Z79891 Long term (current) use of opiate analgesic: Secondary | ICD-10-CM | POA: Diagnosis not present

## 2018-10-31 DIAGNOSIS — R42 Dizziness and giddiness: Secondary | ICD-10-CM | POA: Diagnosis not present

## 2018-11-07 DIAGNOSIS — E039 Hypothyroidism, unspecified: Secondary | ICD-10-CM | POA: Diagnosis not present

## 2018-11-07 DIAGNOSIS — R531 Weakness: Secondary | ICD-10-CM | POA: Diagnosis not present

## 2018-11-07 DIAGNOSIS — R42 Dizziness and giddiness: Secondary | ICD-10-CM | POA: Diagnosis not present

## 2018-11-07 DIAGNOSIS — M1990 Unspecified osteoarthritis, unspecified site: Secondary | ICD-10-CM | POA: Diagnosis not present

## 2018-11-07 DIAGNOSIS — E785 Hyperlipidemia, unspecified: Secondary | ICD-10-CM | POA: Diagnosis not present

## 2018-11-07 DIAGNOSIS — I69398 Other sequelae of cerebral infarction: Secondary | ICD-10-CM | POA: Diagnosis not present

## 2018-11-07 DIAGNOSIS — Z9181 History of falling: Secondary | ICD-10-CM | POA: Diagnosis not present

## 2018-11-07 DIAGNOSIS — M810 Age-related osteoporosis without current pathological fracture: Secondary | ICD-10-CM | POA: Diagnosis not present

## 2018-11-07 DIAGNOSIS — J449 Chronic obstructive pulmonary disease, unspecified: Secondary | ICD-10-CM | POA: Diagnosis not present

## 2018-11-07 DIAGNOSIS — Z792 Long term (current) use of antibiotics: Secondary | ICD-10-CM | POA: Diagnosis not present

## 2018-11-07 DIAGNOSIS — M21371 Foot drop, right foot: Secondary | ICD-10-CM | POA: Diagnosis not present

## 2018-11-07 DIAGNOSIS — Z79891 Long term (current) use of opiate analgesic: Secondary | ICD-10-CM | POA: Diagnosis not present

## 2018-11-07 DIAGNOSIS — Z7982 Long term (current) use of aspirin: Secondary | ICD-10-CM | POA: Diagnosis not present

## 2018-11-08 DIAGNOSIS — M810 Age-related osteoporosis without current pathological fracture: Secondary | ICD-10-CM | POA: Diagnosis not present

## 2018-11-08 DIAGNOSIS — M21371 Foot drop, right foot: Secondary | ICD-10-CM | POA: Diagnosis not present

## 2018-11-08 DIAGNOSIS — M1990 Unspecified osteoarthritis, unspecified site: Secondary | ICD-10-CM | POA: Diagnosis not present

## 2018-11-08 DIAGNOSIS — I69398 Other sequelae of cerebral infarction: Secondary | ICD-10-CM | POA: Diagnosis not present

## 2018-11-08 DIAGNOSIS — E039 Hypothyroidism, unspecified: Secondary | ICD-10-CM | POA: Diagnosis not present

## 2018-11-08 DIAGNOSIS — J449 Chronic obstructive pulmonary disease, unspecified: Secondary | ICD-10-CM | POA: Diagnosis not present

## 2018-11-08 DIAGNOSIS — Z792 Long term (current) use of antibiotics: Secondary | ICD-10-CM | POA: Diagnosis not present

## 2018-11-08 DIAGNOSIS — Z9181 History of falling: Secondary | ICD-10-CM | POA: Diagnosis not present

## 2018-11-08 DIAGNOSIS — E785 Hyperlipidemia, unspecified: Secondary | ICD-10-CM | POA: Diagnosis not present

## 2018-11-08 DIAGNOSIS — Z7982 Long term (current) use of aspirin: Secondary | ICD-10-CM | POA: Diagnosis not present

## 2018-11-08 DIAGNOSIS — Z79891 Long term (current) use of opiate analgesic: Secondary | ICD-10-CM | POA: Diagnosis not present

## 2018-11-08 DIAGNOSIS — R531 Weakness: Secondary | ICD-10-CM | POA: Diagnosis not present

## 2018-11-08 DIAGNOSIS — R42 Dizziness and giddiness: Secondary | ICD-10-CM | POA: Diagnosis not present

## 2018-11-11 DIAGNOSIS — R531 Weakness: Secondary | ICD-10-CM | POA: Diagnosis not present

## 2018-11-11 DIAGNOSIS — M1990 Unspecified osteoarthritis, unspecified site: Secondary | ICD-10-CM | POA: Diagnosis not present

## 2018-11-11 DIAGNOSIS — I69398 Other sequelae of cerebral infarction: Secondary | ICD-10-CM | POA: Diagnosis not present

## 2018-11-11 DIAGNOSIS — M21371 Foot drop, right foot: Secondary | ICD-10-CM | POA: Diagnosis not present

## 2018-11-12 DIAGNOSIS — R002 Palpitations: Secondary | ICD-10-CM | POA: Diagnosis not present

## 2018-11-12 DIAGNOSIS — Z79899 Other long term (current) drug therapy: Secondary | ICD-10-CM | POA: Diagnosis not present

## 2018-11-12 DIAGNOSIS — E7849 Other hyperlipidemia: Secondary | ICD-10-CM | POA: Diagnosis not present

## 2018-11-12 DIAGNOSIS — L309 Dermatitis, unspecified: Secondary | ICD-10-CM | POA: Diagnosis not present

## 2018-11-12 DIAGNOSIS — E039 Hypothyroidism, unspecified: Secondary | ICD-10-CM | POA: Diagnosis not present

## 2018-11-12 DIAGNOSIS — R5383 Other fatigue: Secondary | ICD-10-CM | POA: Diagnosis not present

## 2018-11-13 DIAGNOSIS — Z79891 Long term (current) use of opiate analgesic: Secondary | ICD-10-CM | POA: Diagnosis not present

## 2018-11-13 DIAGNOSIS — E785 Hyperlipidemia, unspecified: Secondary | ICD-10-CM | POA: Diagnosis not present

## 2018-11-13 DIAGNOSIS — R42 Dizziness and giddiness: Secondary | ICD-10-CM | POA: Diagnosis not present

## 2018-11-13 DIAGNOSIS — I69398 Other sequelae of cerebral infarction: Secondary | ICD-10-CM | POA: Diagnosis not present

## 2018-11-13 DIAGNOSIS — Z9181 History of falling: Secondary | ICD-10-CM | POA: Diagnosis not present

## 2018-11-13 DIAGNOSIS — J449 Chronic obstructive pulmonary disease, unspecified: Secondary | ICD-10-CM | POA: Diagnosis not present

## 2018-11-13 DIAGNOSIS — Z7982 Long term (current) use of aspirin: Secondary | ICD-10-CM | POA: Diagnosis not present

## 2018-11-13 DIAGNOSIS — E039 Hypothyroidism, unspecified: Secondary | ICD-10-CM | POA: Diagnosis not present

## 2018-11-13 DIAGNOSIS — M810 Age-related osteoporosis without current pathological fracture: Secondary | ICD-10-CM | POA: Diagnosis not present

## 2018-11-13 DIAGNOSIS — R531 Weakness: Secondary | ICD-10-CM | POA: Diagnosis not present

## 2018-11-13 DIAGNOSIS — M1990 Unspecified osteoarthritis, unspecified site: Secondary | ICD-10-CM | POA: Diagnosis not present

## 2018-11-13 DIAGNOSIS — Z792 Long term (current) use of antibiotics: Secondary | ICD-10-CM | POA: Diagnosis not present

## 2018-11-13 DIAGNOSIS — M21371 Foot drop, right foot: Secondary | ICD-10-CM | POA: Diagnosis not present

## 2018-11-14 DIAGNOSIS — M21371 Foot drop, right foot: Secondary | ICD-10-CM | POA: Diagnosis not present

## 2018-11-14 DIAGNOSIS — E785 Hyperlipidemia, unspecified: Secondary | ICD-10-CM | POA: Diagnosis not present

## 2018-11-14 DIAGNOSIS — Z9181 History of falling: Secondary | ICD-10-CM | POA: Diagnosis not present

## 2018-11-14 DIAGNOSIS — J449 Chronic obstructive pulmonary disease, unspecified: Secondary | ICD-10-CM | POA: Diagnosis not present

## 2018-11-14 DIAGNOSIS — I69398 Other sequelae of cerebral infarction: Secondary | ICD-10-CM | POA: Diagnosis not present

## 2018-11-14 DIAGNOSIS — Z79891 Long term (current) use of opiate analgesic: Secondary | ICD-10-CM | POA: Diagnosis not present

## 2018-11-14 DIAGNOSIS — M1990 Unspecified osteoarthritis, unspecified site: Secondary | ICD-10-CM | POA: Diagnosis not present

## 2018-11-14 DIAGNOSIS — M810 Age-related osteoporosis without current pathological fracture: Secondary | ICD-10-CM | POA: Diagnosis not present

## 2018-11-14 DIAGNOSIS — R42 Dizziness and giddiness: Secondary | ICD-10-CM | POA: Diagnosis not present

## 2018-11-14 DIAGNOSIS — R531 Weakness: Secondary | ICD-10-CM | POA: Diagnosis not present

## 2018-11-14 DIAGNOSIS — Z792 Long term (current) use of antibiotics: Secondary | ICD-10-CM | POA: Diagnosis not present

## 2018-11-14 DIAGNOSIS — E039 Hypothyroidism, unspecified: Secondary | ICD-10-CM | POA: Diagnosis not present

## 2018-11-14 DIAGNOSIS — Z7982 Long term (current) use of aspirin: Secondary | ICD-10-CM | POA: Diagnosis not present

## 2018-11-19 DIAGNOSIS — J449 Chronic obstructive pulmonary disease, unspecified: Secondary | ICD-10-CM | POA: Diagnosis not present

## 2018-11-19 DIAGNOSIS — M810 Age-related osteoporosis without current pathological fracture: Secondary | ICD-10-CM | POA: Diagnosis not present

## 2018-11-19 DIAGNOSIS — R42 Dizziness and giddiness: Secondary | ICD-10-CM | POA: Diagnosis not present

## 2018-11-19 DIAGNOSIS — E039 Hypothyroidism, unspecified: Secondary | ICD-10-CM | POA: Diagnosis not present

## 2018-11-19 DIAGNOSIS — E785 Hyperlipidemia, unspecified: Secondary | ICD-10-CM | POA: Diagnosis not present

## 2018-11-19 DIAGNOSIS — Z79891 Long term (current) use of opiate analgesic: Secondary | ICD-10-CM | POA: Diagnosis not present

## 2018-11-19 DIAGNOSIS — M21371 Foot drop, right foot: Secondary | ICD-10-CM | POA: Diagnosis not present

## 2018-11-19 DIAGNOSIS — Z9181 History of falling: Secondary | ICD-10-CM | POA: Diagnosis not present

## 2018-11-19 DIAGNOSIS — I69398 Other sequelae of cerebral infarction: Secondary | ICD-10-CM | POA: Diagnosis not present

## 2018-11-19 DIAGNOSIS — M1990 Unspecified osteoarthritis, unspecified site: Secondary | ICD-10-CM | POA: Diagnosis not present

## 2018-11-19 DIAGNOSIS — R531 Weakness: Secondary | ICD-10-CM | POA: Diagnosis not present

## 2018-11-19 DIAGNOSIS — Z792 Long term (current) use of antibiotics: Secondary | ICD-10-CM | POA: Diagnosis not present

## 2018-11-19 DIAGNOSIS — Z7982 Long term (current) use of aspirin: Secondary | ICD-10-CM | POA: Diagnosis not present

## 2018-11-21 DIAGNOSIS — R42 Dizziness and giddiness: Secondary | ICD-10-CM | POA: Diagnosis not present

## 2018-11-21 DIAGNOSIS — Z79891 Long term (current) use of opiate analgesic: Secondary | ICD-10-CM | POA: Diagnosis not present

## 2018-11-21 DIAGNOSIS — E785 Hyperlipidemia, unspecified: Secondary | ICD-10-CM | POA: Diagnosis not present

## 2018-11-21 DIAGNOSIS — M1990 Unspecified osteoarthritis, unspecified site: Secondary | ICD-10-CM | POA: Diagnosis not present

## 2018-11-21 DIAGNOSIS — J449 Chronic obstructive pulmonary disease, unspecified: Secondary | ICD-10-CM | POA: Diagnosis not present

## 2018-11-21 DIAGNOSIS — R531 Weakness: Secondary | ICD-10-CM | POA: Diagnosis not present

## 2018-11-21 DIAGNOSIS — M21371 Foot drop, right foot: Secondary | ICD-10-CM | POA: Diagnosis not present

## 2018-11-21 DIAGNOSIS — Z792 Long term (current) use of antibiotics: Secondary | ICD-10-CM | POA: Diagnosis not present

## 2018-11-21 DIAGNOSIS — E039 Hypothyroidism, unspecified: Secondary | ICD-10-CM | POA: Diagnosis not present

## 2018-11-21 DIAGNOSIS — I69398 Other sequelae of cerebral infarction: Secondary | ICD-10-CM | POA: Diagnosis not present

## 2018-11-21 DIAGNOSIS — Z7982 Long term (current) use of aspirin: Secondary | ICD-10-CM | POA: Diagnosis not present

## 2018-11-21 DIAGNOSIS — M810 Age-related osteoporosis without current pathological fracture: Secondary | ICD-10-CM | POA: Diagnosis not present

## 2018-11-21 DIAGNOSIS — Z9181 History of falling: Secondary | ICD-10-CM | POA: Diagnosis not present

## 2018-11-26 ENCOUNTER — Other Ambulatory Visit: Payer: Self-pay

## 2018-11-26 ENCOUNTER — Emergency Department (HOSPITAL_COMMUNITY)
Admission: EM | Admit: 2018-11-26 | Discharge: 2018-11-26 | Disposition: A | Discharge status: Home / Self Care | Payer: Medicare Other | Attending: Emergency Medicine | Admitting: Emergency Medicine

## 2018-11-26 ENCOUNTER — Encounter (HOSPITAL_COMMUNITY): Payer: Self-pay | Admitting: *Deleted

## 2018-11-26 DIAGNOSIS — F17210 Nicotine dependence, cigarettes, uncomplicated: Secondary | ICD-10-CM | POA: Insufficient documentation

## 2018-11-26 DIAGNOSIS — N3090 Cystitis, unspecified without hematuria: Secondary | ICD-10-CM | POA: Insufficient documentation

## 2018-11-26 DIAGNOSIS — I10 Essential (primary) hypertension: Secondary | ICD-10-CM | POA: Insufficient documentation

## 2018-11-26 DIAGNOSIS — R300 Dysuria: Secondary | ICD-10-CM | POA: Diagnosis not present

## 2018-11-26 DIAGNOSIS — R5381 Other malaise: Secondary | ICD-10-CM | POA: Diagnosis not present

## 2018-11-26 DIAGNOSIS — Z79899 Other long term (current) drug therapy: Secondary | ICD-10-CM | POA: Diagnosis not present

## 2018-11-26 DIAGNOSIS — I959 Hypotension, unspecified: Secondary | ICD-10-CM | POA: Diagnosis not present

## 2018-11-26 LAB — URINALYSIS, ROUTINE W REFLEX MICROSCOPIC
Bacteria, UA: NONE SEEN
Bilirubin Urine: NEGATIVE
Glucose, UA: NEGATIVE mg/dL
Ketones, ur: NEGATIVE mg/dL
Nitrite: NEGATIVE
Protein, ur: 100 mg/dL — AB
RBC / HPF: >50 RBC/hpf — ABNORMAL HIGH (ref 0–5)
Specific Gravity, Urine: 1.015 (ref 1.005–1.030)
WBC, UA: >50 WBC/hpf — ABNORMAL HIGH (ref 0–5)
pH: 6 (ref 5.0–8.0)

## 2018-11-26 MED ORDER — CEPHALEXIN 500 MG PO CAPS
500.0000 mg | ORAL_CAPSULE | Freq: Once | ORAL | Status: AC
  Administered 2018-11-26: 500 mg via ORAL
  Filled 2018-11-26: qty 1

## 2018-11-26 MED ORDER — CLOTRIMAZOLE-BETAMETHASONE 1-0.05 % EX CREA: TOPICAL_CREAM | CUTANEOUS | 0 refills | Status: DC

## 2018-11-26 MED ORDER — CEPHALEXIN 500 MG PO CAPS: 500.0000 mg | ORAL_CAPSULE | Freq: Four times a day (QID) | ORAL | 0 refills | Status: DC

## 2018-11-26 NOTE — ED Notes (Signed)
Pt's skin red and excoriated from vaginal area to rectum. Pt already had some type of barrier cream in place but stool was present on an already sensitive area. Pt was cleaned thoroughly and Urine was obtained by In and Out cath per request of pt. More barrier cream applied to pt's skin after U/A was obtained.

## 2018-11-26 NOTE — ED Triage Notes (Signed)
Pt c/o burning to her vagina and rectum everytime she urinates x 3 weeks; pt reports she was given a cream by her PCP but is unable to state where she is to put the cream

## 2018-11-26 NOTE — Discharge Instructions (Addendum)
Clean and dry your rash area twice a day and then apply the cream.  Follow-up with your doctor this week

## 2018-11-26 NOTE — ED Provider Notes (Signed)
[facility] EMERGENCY DEPARTMENT Provider Note   CSN: [id number] Arrival date & time: 11/26/18  0712    History   Chief Complaint Chief Complaint  Patient presents with  . Dysuria    HPI Amanda Armstrong is a 73 y.o. female.     Patient complains of pain with urination and a rash in her genital area  The history is provided by the patient. No language interpreter was used.  Dysuria  Pain quality:  Burning Pain severity:  Moderate Onset quality:  Sudden Timing:  Constant Progression:  Worsening Chronicity:  New Recent urinary tract infections: no   Relieved by:  Nothing Worsened by:  Nothing Ineffective treatments:  None tried Urinary symptoms: no discolored urine   Associated symptoms: no abdominal pain     Past Medical History:  Diagnosis Date  . Anxiety   . Arthritis    Bilateral Knee DJD  . Chronic pain   . Chronic pain   . Chronically on opiate therapy   . Clostridium difficile carrier    pt stated that she has intermittent sx  . Clostridium difficile infection   . Daytime somnolence   . Frequent falls   . Headache(784.0)    SINUS HEADACHE OCCASSIONALLY  . Hypertension   . Hypothyroidism   . Multifactorial gait disorder   . Osteoporosis   . Right foot drop   . Snoring   . Stroke (HCC)   . Tobacco use   . Vertigo   . Weakness     Patient Active Problem List   Diagnosis Date Noted  . UTI (urinary tract infection) 01/29/2016  . Nicotine dependence 09/07/2015  . Enteritis due to Clostridium difficile 08/29/2013  . Weakness 08/02/2013  . Diarrhea 08/02/2013  . Acute pyelonephritis 05/28/2013  . UTI (lower urinary tract infection) 05/27/2013  . Tachypnea 05/27/2013  . Hyperglycemia 05/27/2013  . Arthritis   . Anxiety   . Hypothyroidism     Past Surgical History:  Procedure Laterality Date  . CATARACT EXTRACTION W/PHACO Right 06/28/2016   Procedure: CATARACT EXTRACTION PHACO AND INTRAOCULAR LENS PLACEMENT (IOC);  Surgeon: Jethro Bolus,  MD;  Location: [facility];  Service: Ophthalmology;  Laterality: Right;  CDE: 11.10  . CATARACT EXTRACTION W/PHACO Left 07/26/2016   Procedure: CATARACT EXTRACTION PHACO AND INTRAOCULAR LENS PLACEMENT (IOC);  Surgeon: Jethro Bolus, MD;  Location: [facility];  Service: Ophthalmology;  Laterality: Left;  CDE: 8.88  . JOINT REPLACEMENT  11/08/3010   right total knee  . TOTAL KNEE ARTHROPLASTY  11/07/2011   Procedure: TOTAL KNEE ARTHROPLASTY;  Surgeon: Nilda Simmer, MD;  Location: [facility];  Service: Orthopedics;  Laterality: Left;  DR Thurston Hole WANTS 90 MINUTES FOR THIS CASE  . TOTAL KNEE ARTHROPLASTY Right 2011  . TUBAL LIGATION  1982     OB History   No obstetric history on file.      Home Medications    Prior to Admission medications   Medication Sig Start Date End Date Taking? Authorizing Provider  alendronate (FOSAMAX) 70 MG tablet Take 70 mg by mouth every Sunday.  04/05/13   [provider]  ALPRAZolam Prudy Feeler) 1 MG tablet Take 1 tablet (1 mg total) by mouth 3 (three) times daily. Patient taking differently: Take 1 mg by mouth at bedtime.  02/01/16   Erick Blinks, MD  aspirin EC 81 MG tablet Take 81 mg by mouth daily.    [provider]  atorvastatin (LIPITOR) 20 MG tablet Take 20 mg by mouth at bedtime.  09/14/15   [provider]  cephALEXin (KEFLEX) 500 MG capsule Take 1 capsule (500 mg total) by mouth 4 (four) times daily. 11/26/18   Bethann Berkshire, MD  ciprofloxacin (CIPRO) 500 MG tablet Take 1 tablet (500 mg total) by mouth 2 (two) times daily. 02/01/16   Erick Blinks, MD  clotrimazole-betamethasone (LOTRISONE) cream Apply to affected area 2 times daily prn 11/26/18   Bethann Berkshire, MD  furosemide (LASIX) 20 MG tablet Take 20 mg by mouth daily.    [provider]  HYDROcodone-acetaminophen (NORCO) 10-325 MG tablet Take 1 tablet by mouth at bedtime.    [provider]  levothyroxine (SYNTHROID, LEVOTHROID) 75 MCG tablet Take 75 mcg by mouth daily.  08/29/15   [provider]  meclizine (ANTIVERT) 25 MG tablet Take 1 tablet (25 mg total) by mouth 3 (three) times daily as needed for dizziness. 04/13/16   Lavera Guise, MD    Family History Family History  Problem Relation Age of Onset  . Heart attack Mother   . COPD Father   . Arthritis Sister   . Heart disease Brother     Social History Social History   Tobacco Use  . Smoking status: Current Every Day Smoker    Packs/day: 0.50    Years: 48.00    Pack years: 24.00    Types: Cigarettes    Last attempt to quit: 06/15/2012    Years since quitting: 6.4  . Smokeless tobacco: Never Used  . Tobacco comment: 12/29/14 1/2 PPD  Substance Use Topics  . Alcohol use: No    Alcohol/week: 0.0 standard drinks  . Drug use: No     Allergies   Iron; Oxycodone; and Sudafed [pseudoephedrine hcl]   Review of Systems Review of Systems  Constitutional: Negative for appetite change and fatigue.  HENT: Negative for congestion, ear discharge and sinus pressure.   Eyes: Negative for discharge.  Respiratory: Negative for cough.   Cardiovascular: Negative for chest pain.  Gastrointestinal: Negative for abdominal pain and diarrhea.  Genitourinary: Positive for dysuria. Negative for frequency and hematuria.  Musculoskeletal: Negative for back pain.  Skin: Negative for rash.  Neurological: Negative for seizures and headaches.  Psychiatric/Behavioral: Negative for hallucinations.     Physical Exam Updated Vital Signs BP 111/60   Pulse 81   Temp 97.9 F (36.6 C) (Oral)   Resp 17   Ht  (1.473 m)   Wt 59 kg   SpO2 95%   BMI 27.17 kg/m   Physical Exam Vitals signs and nursing note reviewed.  Constitutional:      Appearance: She is well-developed.  HENT:     Head: Normocephalic.     Nose: Nose normal.  Eyes:     General: No scleral icterus.    Conjunctiva/sclera: Conjunctivae normal.  Neck:     Musculoskeletal: Neck supple.     Thyroid: No thyromegaly.   Cardiovascular:     Rate and Rhythm: Normal rate and regular rhythm.     Heart sounds: No murmur. No friction rub. No gallop.   Pulmonary:     Breath sounds: No stridor. No wheezing or rales.  Chest:     Chest wall: No tenderness.  Abdominal:     General: There is no distension.     Tenderness: There is no abdominal tenderness. There is no rebound.  Genitourinary:    Comments: Fungal rash to genital area Musculoskeletal: Normal range of motion.  Lymphadenopathy:     Cervical: No cervical adenopathy.  Skin:    Findings: No erythema or rash.  Neurological:     Mental Status: She is oriented to person, place, and time.     Motor: No abnormal muscle tone.     Coordination: Coordination normal.  Psychiatric:        Behavior: Behavior normal.      ED Treatments / Results  Labs (all labs ordered are listed, but only abnormal results are displayed) Labs Reviewed  URINALYSIS, ROUTINE W REFLEX MICROSCOPIC - Abnormal; Notable for the following components:      Result Value   APPearance TURBID (*)    Hgb urine dipstick SMALL (*)    Protein, ur 100 (*)    Leukocytes,Ua LARGE (*)    RBC / HPF >50 (*)    WBC, UA >50 (*)    All other components within normal limits  URINE CULTURE    EKG None  Radiology No results found.  Procedures Procedures (including critical care time)  Medications Ordered in ED Medications  cephALEXin (KEFLEX) capsule 500 mg (has no administration in time range)     Initial Impression / Assessment and Plan / ED Course  I have reviewed the triage vital signs and the nursing notes.  Pertinent labs & imaging results that were available during my care of the patient were reviewed by me and considered in my medical decision making (see chart for details).        She with UTI and a rash.  She will be placed on Keflex and Lotrisone and follow-up with PCP Final Clinical Impressions(s) / ED Diagnoses   Final diagnoses:  Cystitis    ED Discharge  Orders         Ordered    cephALEXin (KEFLEX) 500 MG capsule  4 times daily     11/26/18 0618    clotrimazole-betamethasone (LOTRISONE) cream     11/26/18 [id number]           Bethann BerkshireZammit, Quamaine Webb, MD 11/26/18 [phone]

## 2018-11-27 DIAGNOSIS — M810 Age-related osteoporosis without current pathological fracture: Secondary | ICD-10-CM | POA: Diagnosis not present

## 2018-11-27 DIAGNOSIS — Z7982 Long term (current) use of aspirin: Secondary | ICD-10-CM | POA: Diagnosis not present

## 2018-11-27 DIAGNOSIS — R531 Weakness: Secondary | ICD-10-CM | POA: Diagnosis not present

## 2018-11-27 DIAGNOSIS — R42 Dizziness and giddiness: Secondary | ICD-10-CM | POA: Diagnosis not present

## 2018-11-27 DIAGNOSIS — M21371 Foot drop, right foot: Secondary | ICD-10-CM | POA: Diagnosis not present

## 2018-11-27 DIAGNOSIS — Z79891 Long term (current) use of opiate analgesic: Secondary | ICD-10-CM | POA: Diagnosis not present

## 2018-11-27 DIAGNOSIS — Z9181 History of falling: Secondary | ICD-10-CM | POA: Diagnosis not present

## 2018-11-27 DIAGNOSIS — J449 Chronic obstructive pulmonary disease, unspecified: Secondary | ICD-10-CM | POA: Diagnosis not present

## 2018-11-27 DIAGNOSIS — M1990 Unspecified osteoarthritis, unspecified site: Secondary | ICD-10-CM | POA: Diagnosis not present

## 2018-11-27 DIAGNOSIS — Z792 Long term (current) use of antibiotics: Secondary | ICD-10-CM | POA: Diagnosis not present

## 2018-11-27 DIAGNOSIS — I69398 Other sequelae of cerebral infarction: Secondary | ICD-10-CM | POA: Diagnosis not present

## 2018-11-27 DIAGNOSIS — E039 Hypothyroidism, unspecified: Secondary | ICD-10-CM | POA: Diagnosis not present

## 2018-11-27 DIAGNOSIS — E785 Hyperlipidemia, unspecified: Secondary | ICD-10-CM | POA: Diagnosis not present

## 2018-11-28 DIAGNOSIS — E785 Hyperlipidemia, unspecified: Secondary | ICD-10-CM | POA: Diagnosis not present

## 2018-11-28 DIAGNOSIS — E039 Hypothyroidism, unspecified: Secondary | ICD-10-CM | POA: Diagnosis not present

## 2018-11-28 DIAGNOSIS — Z9181 History of falling: Secondary | ICD-10-CM | POA: Diagnosis not present

## 2018-11-28 DIAGNOSIS — M1990 Unspecified osteoarthritis, unspecified site: Secondary | ICD-10-CM | POA: Diagnosis not present

## 2018-11-28 DIAGNOSIS — I69398 Other sequelae of cerebral infarction: Secondary | ICD-10-CM | POA: Diagnosis not present

## 2018-11-28 DIAGNOSIS — M21371 Foot drop, right foot: Secondary | ICD-10-CM | POA: Diagnosis not present

## 2018-11-28 DIAGNOSIS — R42 Dizziness and giddiness: Secondary | ICD-10-CM | POA: Diagnosis not present

## 2018-11-28 DIAGNOSIS — Z7982 Long term (current) use of aspirin: Secondary | ICD-10-CM | POA: Diagnosis not present

## 2018-11-28 DIAGNOSIS — J449 Chronic obstructive pulmonary disease, unspecified: Secondary | ICD-10-CM | POA: Diagnosis not present

## 2018-11-28 DIAGNOSIS — Z792 Long term (current) use of antibiotics: Secondary | ICD-10-CM | POA: Diagnosis not present

## 2018-11-28 DIAGNOSIS — R531 Weakness: Secondary | ICD-10-CM | POA: Diagnosis not present

## 2018-11-28 DIAGNOSIS — Z79891 Long term (current) use of opiate analgesic: Secondary | ICD-10-CM | POA: Diagnosis not present

## 2018-11-28 DIAGNOSIS — M810 Age-related osteoporosis without current pathological fracture: Secondary | ICD-10-CM | POA: Diagnosis not present

## 2018-11-29 DIAGNOSIS — B372 Candidiasis of skin and nail: Secondary | ICD-10-CM | POA: Diagnosis not present

## 2018-11-29 DIAGNOSIS — N390 Urinary tract infection, site not specified: Secondary | ICD-10-CM | POA: Diagnosis not present

## 2018-11-29 LAB — URINE CULTURE: Culture: 80000 — AB

## 2018-11-30 ENCOUNTER — Telehealth: Payer: Self-pay

## 2018-11-30 DIAGNOSIS — W19XXXA Unspecified fall, initial encounter: Secondary | ICD-10-CM | POA: Diagnosis not present

## 2018-11-30 DIAGNOSIS — T1490XA Injury, unspecified, initial encounter: Secondary | ICD-10-CM | POA: Diagnosis not present

## 2018-11-30 DIAGNOSIS — R5381 Other malaise: Secondary | ICD-10-CM | POA: Diagnosis not present

## 2018-11-30 NOTE — Telephone Encounter (Signed)
Post ED Visit - Positive Culture Follow-up  Culture report reviewed by antimicrobial stewardship pharmacist: [facility] []  Enzo Bi, Pharm.D. []  Celedonio Miyamoto, Pharm.D., BCPS AQ-ID []  Garvin Fila, Pharm.D., BCPS []  Georgina Pillion, Pharm.D., BCPS []  [place], [address].D., BCPS, AAHIVP []  Estella Husk, Pharm.D., BCPS, AAHIVP []  Lysle Pearl, PharmD, BCPS []  Phillips Climes, PharmD, BCPS []  Agapito Games, PharmD, BCPS []  Verlan Friends, PharmD []  Mervyn Gay, PharmD, BCPS []  Vinnie Level, PharmD [facility] []  Len Childs, PharmD []  Greer Pickerel, PharmD []  Adalberto Cole, PharmD []  Perlie Gold, Rph []  Lonell Face) Jean Rosenthal, PharmD []  Earl Many, PharmD []  Junita Push, PharmD []  Dorna Leitz, PharmD []  Terrilee Files, PharmD []  Lynann Beaver, PharmD []  Keturah Barre, PharmD []  Loralee Pacas, PharmD []  Bernadene Person, PharmD   Positive urine culture Treated with Cephalexin, organism sensitive to the same and no further patient follow-up is required at this time.  Jerry Caras 11/30/2018, 8:47 AM

## 2018-12-03 DIAGNOSIS — M1990 Unspecified osteoarthritis, unspecified site: Secondary | ICD-10-CM | POA: Diagnosis not present

## 2018-12-03 DIAGNOSIS — E785 Hyperlipidemia, unspecified: Secondary | ICD-10-CM | POA: Diagnosis not present

## 2018-12-03 DIAGNOSIS — Z79891 Long term (current) use of opiate analgesic: Secondary | ICD-10-CM | POA: Diagnosis not present

## 2018-12-03 DIAGNOSIS — E039 Hypothyroidism, unspecified: Secondary | ICD-10-CM | POA: Diagnosis not present

## 2018-12-03 DIAGNOSIS — R42 Dizziness and giddiness: Secondary | ICD-10-CM | POA: Diagnosis not present

## 2018-12-03 DIAGNOSIS — J449 Chronic obstructive pulmonary disease, unspecified: Secondary | ICD-10-CM | POA: Diagnosis not present

## 2018-12-03 DIAGNOSIS — M810 Age-related osteoporosis without current pathological fracture: Secondary | ICD-10-CM | POA: Diagnosis not present

## 2018-12-03 DIAGNOSIS — R531 Weakness: Secondary | ICD-10-CM | POA: Diagnosis not present

## 2018-12-03 DIAGNOSIS — I69398 Other sequelae of cerebral infarction: Secondary | ICD-10-CM | POA: Diagnosis not present

## 2018-12-03 DIAGNOSIS — Z792 Long term (current) use of antibiotics: Secondary | ICD-10-CM | POA: Diagnosis not present

## 2018-12-03 DIAGNOSIS — Z7982 Long term (current) use of aspirin: Secondary | ICD-10-CM | POA: Diagnosis not present

## 2018-12-03 DIAGNOSIS — M21371 Foot drop, right foot: Secondary | ICD-10-CM | POA: Diagnosis not present

## 2018-12-03 DIAGNOSIS — Z9181 History of falling: Secondary | ICD-10-CM | POA: Diagnosis not present

## 2018-12-05 DIAGNOSIS — Z9181 History of falling: Secondary | ICD-10-CM | POA: Diagnosis not present

## 2018-12-05 DIAGNOSIS — Z7982 Long term (current) use of aspirin: Secondary | ICD-10-CM | POA: Diagnosis not present

## 2018-12-05 DIAGNOSIS — M1990 Unspecified osteoarthritis, unspecified site: Secondary | ICD-10-CM | POA: Diagnosis not present

## 2018-12-05 DIAGNOSIS — R42 Dizziness and giddiness: Secondary | ICD-10-CM | POA: Diagnosis not present

## 2018-12-05 DIAGNOSIS — M810 Age-related osteoporosis without current pathological fracture: Secondary | ICD-10-CM | POA: Diagnosis not present

## 2018-12-05 DIAGNOSIS — E785 Hyperlipidemia, unspecified: Secondary | ICD-10-CM | POA: Diagnosis not present

## 2018-12-05 DIAGNOSIS — Z792 Long term (current) use of antibiotics: Secondary | ICD-10-CM | POA: Diagnosis not present

## 2018-12-05 DIAGNOSIS — R531 Weakness: Secondary | ICD-10-CM | POA: Diagnosis not present

## 2018-12-05 DIAGNOSIS — J449 Chronic obstructive pulmonary disease, unspecified: Secondary | ICD-10-CM | POA: Diagnosis not present

## 2018-12-05 DIAGNOSIS — E039 Hypothyroidism, unspecified: Secondary | ICD-10-CM | POA: Diagnosis not present

## 2018-12-05 DIAGNOSIS — Z79891 Long term (current) use of opiate analgesic: Secondary | ICD-10-CM | POA: Diagnosis not present

## 2018-12-05 DIAGNOSIS — M21371 Foot drop, right foot: Secondary | ICD-10-CM | POA: Diagnosis not present

## 2018-12-05 DIAGNOSIS — I69398 Other sequelae of cerebral infarction: Secondary | ICD-10-CM | POA: Diagnosis not present

## 2018-12-06 ENCOUNTER — Telehealth: Payer: Self-pay | Admitting: Cardiology

## 2018-12-06 NOTE — Telephone Encounter (Signed)
Virtual Visit Pre-Appointment Phone Call  "(Name), I am calling you today to discuss your upcoming appointment. We are currently trying to limit exposure to the virus that causes COVID-19 by seeing patients at home rather than in the office."  1. "What is the BEST phone number to call the day of the visit?" - include this in appointment notes  2. Do you have or have access to (through a family member/friend) a smartphone with video capability that we can use for your visit?" a. If yes - list this number in appt notes as cell (if different from BEST phone #) and list the appointment type as a VIDEO visit in appointment notes b. If no - list the appointment type as a PHONE visit in appointment notes  3. Confirm consent - "In the setting of the current Covid19 crisis, you are scheduled for a (phone or video) visit with your provider on (date) at (time).  Just as we do with many in-office visits, in order for you to participate in this visit, we must obtain consent.  If you'd like, I can send this to your mychart (if signed up) or email for you to review.  Otherwise, I can obtain your verbal consent now.  All virtual visits are billed to your insurance company just like a normal visit would be.  By agreeing to a virtual visit, we'd like you to understand that the technology does not allow for your provider to perform an examination, and thus may limit your provider's ability to fully assess your condition. If your provider identifies any concerns that need to be evaluated in person, we will make arrangements to do so.  Finally, though the technology is pretty good, we cannot assure that it will always work on either your or our end, and in the setting of a video visit, we may have to convert it to a phone-only visit.  In either situation, we cannot ensure that we have a secure connection.  Are you willing to proceed?" STAFF: Did the patient verbally acknowledge consent to telehealth visit? Document  YES/NO here: yes  4. Advise patient to be prepared - "Two hours prior to your appointment, go ahead and check your blood pressure, pulse, oxygen saturation, and your weight (if you have the equipment to check those) and write them all down. When your visit starts, your provider will ask you for this information. If you have an Apple Watch or Kardia device, please plan to have heart rate information ready on the day of your appointment. Please have a pen and paper handy nearby the day of the visit as well."  5. Give patient instructions for MyChart download to smartphone OR Doximity/Doxy.me as below if video visit (depending on what platform provider is using)  6. Inform patient they will receive a phone call 15 minutes prior to their appointment time (may be from unknown caller ID) so they should be prepared to answer    TELEPHONE CALL NOTE  Amanda Armstrong has been deemed a candidate for a follow-up tele-health visit to limit community exposure during the Covid-19 pandemic. I spoke with the patient via phone to ensure availability of phone/video source, confirm preferred email & phone number, and discuss instructions and expectations.  I reminded Amanda Armstrong to be prepared with any vital sign and/or heart rhythm information that could potentially be obtained via home monitoring, at the time of her visit. I reminded Amanda Armstrong to expect a phone call prior to  her visit.  Amanda ContrasStephanie R Armstrong 12/06/2018 9:48 AM   INSTRUCTIONS FOR DOWNLOADING THE MYCHART APP TO SMARTPHONE  - The patient must first make sure to have activated MyChart and know their login information - If Apple, go to [employer] Store and type in MyChart in the search bar and download the app. If Android, ask patient to go to [employer] Play Store and type in [place] in the search bar and download the app. The app is free but as with any other app downloads, their phone may require them to verify saved payment information or  Apple/Android password.  - The patient will need to then log into the app with their MyChart username and password, and select [facility] as their healthcare provider to link the account. When it is time for your visit, go to the MyChart app, find appointments, and click Begin Video Visit. Be sure to Select Allow for your device to access the Microphone and Camera for your visit. You will then be connected, and your provider will be with you shortly.  **If they have any issues connecting, or need assistance please contact MyChart service desk [phone])**  **If using a computer, in order to ensure the best quality for their visit they will need to use either of the following Internet Browsers: [employer] Edge, or Google Chrome**  IF USING DOXIMITY or DOXY.ME - The patient will receive a link just prior to their visit by text.     FULL LENGTH CONSENT FOR TELE-HEALTH VISIT   I hereby voluntarily request, consent and authorize [facility] and its employed or contracted physicians, physician assistants, nurse practitioners or other licensed health care professionals (the Practitioner), to provide me with telemedicine health care services (the Services") as deemed necessary by the treating Practitioner. I acknowledge and consent to receive the Services by the Practitioner via telemedicine. I understand that the telemedicine visit will involve communicating with the Practitioner through live audiovisual communication technology and the disclosure of certain medical information by electronic transmission. I acknowledge that I have been given the opportunity to request an in-person assessment or other available alternative prior to the telemedicine visit and am voluntarily participating in the telemedicine visit.  I understand that I have the right to withhold or withdraw my consent to the use of telemedicine in the course of my care at any time, without affecting my right to future care  or treatment, and that the Practitioner or I may terminate the telemedicine visit at any time. I understand that I have the right to inspect all information obtained and/or recorded in the course of the telemedicine visit and may receive copies of available information for a reasonable fee.  I understand that some of the potential risks of receiving the Services via telemedicine include:   Delay or interruption in medical evaluation due to technological equipment failure or disruption;  Information transmitted may not be sufficient (e.g. poor resolution of images) to allow for appropriate medical decision making by the Practitioner; and/or   In rare instances, security protocols could fail, causing a breach of personal health information.  Furthermore, I acknowledge that it is my responsibility to provide information about my medical history, conditions and care that is complete and accurate to the best of my ability. I acknowledge that Practitioner's advice, recommendations, and/or decision may be based on factors not within their control, such as incomplete or inaccurate data provided by me or distortions of diagnostic images or specimens that may result from electronic transmissions. I  understand that the practice of medicine is not an exact science and that Practitioner makes no warranties or guarantees regarding treatment outcomes. I acknowledge that I will receive a copy of this consent concurrently upon execution via email to the email address I last provided but may also request a printed copy by calling the office of [place].    I understand that my insurance will be billed for this visit.   I have read or had this consent read to me.  I understand the contents of this consent, which adequately explains the benefits and risks of the Services being provided via telemedicine.   I have been provided ample opportunity to ask questions regarding this consent and the Services and have had  my questions answered to my satisfaction.  I give my informed consent for the services to be provided through the use of telemedicine in my medical care  By participating in this telemedicine visit I agree to the above.

## 2018-12-07 ENCOUNTER — Telehealth: Payer: Medicare Other | Admitting: Cardiology

## 2018-12-07 ENCOUNTER — Encounter: Payer: Self-pay | Admitting: *Deleted

## 2018-12-07 NOTE — Progress Notes (Unsigned)
{Choose 1 Note Type (Telehealth Visit or Telephone Visit):[phone]}   Date:  12/07/2018   ID:  Amanda Armstrong, DOB [birth year], MRN [id number]  {Patient Location:[phone]::"Home"} {Provider Location:([phone]::"Home"}  PCP:  Amanda Armstrong, [name], MD  Cardiologist:  No primary care provider on file. *** Electrophysiologist:  None   Evaluation Performed:  {Choose Visit Type:[phone]::"Follow-Up Visit"}  Chief Complaint:  ***  History of Present Illness:    Amanda SailorsRebekah P Armstrong is a 73 y.o. female with ***  1. Palpitations      The patient {does/does not:200015} have symptoms concerning for COVID-19 infection (fever, chills, cough, or new shortness of breath).    Past Medical History:  Diagnosis Date  . Anxiety   . Arthritis    Bilateral Knee DJD  . Chronic pain   . Chronic pain   . Chronically on opiate therapy   . Clostridium difficile carrier    pt stated that she has intermittent sx  . Clostridium difficile infection   . Daytime somnolence   . Frequent falls   . Headache(784.0)    SINUS HEADACHE OCCASSIONALLY  . Hypertension   . Hypothyroidism   . Multifactorial gait disorder   . Osteoporosis   . Right foot drop   . Snoring   . Stroke (HCC)   . Tobacco use   . Vertigo   . Weakness    Past Surgical History:  Procedure Laterality Date  . CATARACT EXTRACTION W/PHACO Right 06/28/2016   Procedure: CATARACT EXTRACTION PHACO AND INTRAOCULAR LENS PLACEMENT (IOC);  Surgeon: Amanda BolusMark Shapiro, MD;  Location: [facility];  Service: Ophthalmology;  Laterality: Right;  CDE: 11.10  . CATARACT EXTRACTION W/PHACO Left 07/26/2016   Procedure: CATARACT EXTRACTION PHACO AND INTRAOCULAR LENS PLACEMENT (IOC);  Surgeon: Amanda BolusMark Shapiro, MD;  Location: [facility];  Service: Ophthalmology;  Laterality: Left;  CDE: 8.88  . JOINT REPLACEMENT  11/08/3010   right total knee  . TOTAL KNEE ARTHROPLASTY  11/07/2011   Procedure: TOTAL KNEE ARTHROPLASTY;  Surgeon: Amanda Simmerobert A Wainer, MD;  Location: [facility];   Service: Orthopedics;  Laterality: Left;  Amanda Armstrong Armstrong 90 MINUTES FOR THIS CASE  . TOTAL KNEE ARTHROPLASTY Right 2011  . TUBAL LIGATION  1982     No outpatient medications have been marked as taking for the 12/07/18 encounter (Appointment) with Amanda Armstrong,  F, MD.     Allergies:   Iron; Oxycodone; and Sudafed [pseudoephedrine hcl]   Social History   Tobacco Use  . Smoking status: Current Every Day Smoker    Packs/day: 0.50    Years: 48.00    Pack years: 24.00    Types: Cigarettes    Last attempt to quit: 06/15/2012    Years since quitting: 6.4  . Smokeless tobacco: Never Used  . Tobacco comment: 12/29/14 1/2 PPD  Substance Use Topics  . Alcohol use: No    Alcohol/week: 0.0 standard drinks  . Drug use: No     Family Hx: The patient's family history includes Arthritis in her sister; COPD in her father; Heart attack in her mother; Heart disease in her brother.  ROS:   Please see the history of present illness.    *** All other systems reviewed and are negative.   Prior CV studies:   The following studies were reviewed today:  ***  Labs/Other Tests and Data Reviewed:    EKG:  {EKG/Telemetry Strips Reviewed:[phone]}  Recent Labs: 07/29/2018: ALT 27; BUN 17; Creatinine, Ser 1.19; Hemoglobin 13.7; Platelets 267; Potassium 3.6; Sodium 141   Recent Lipid  Panel No results found for: CHOL, TRIG, HDL, CHOLHDL, LDLCALC, LDLDIRECT  Wt Readings from Last 3 Encounters:  11/26/18 130 lb (59 kg)  07/29/18 159 lb 13.3 oz (72.5 kg)  07/19/18 160 lb (72.6 kg)     Objective:    Vital Signs:  There were no vitals taken for this visit.   {HeartCare Virtual Exam (Optional):[id number]::"VITAL SIGNS:  reviewed"}  ASSESSMENT & PLAN:    1. ***  COVID-19 Education: The signs and symptoms of COVID-19 were discussed with the patient and how to seek care for testing (follow up with PCP or arrange E-visit).  ***The importance of social distancing was discussed  today.  Time:   Today, I have spent *** minutes with the patient with telehealth technology discussing the above problems.     Medication Adjustments/Labs and Tests Ordered: Current medicines are reviewed at length with the patient today.  Concerns regarding medicines are outlined above.   Tests Ordered: No orders of the defined types were placed in this encounter.   Medication Changes: No orders of the defined types were placed in this encounter.   Disposition:  Follow up {follow up:15908}  Amanda Coddington, MD  12/07/2018 10:36 AM    [facility]

## 2018-12-12 DIAGNOSIS — E785 Hyperlipidemia, unspecified: Secondary | ICD-10-CM | POA: Diagnosis not present

## 2018-12-12 DIAGNOSIS — Z79891 Long term (current) use of opiate analgesic: Secondary | ICD-10-CM | POA: Diagnosis not present

## 2018-12-12 DIAGNOSIS — J449 Chronic obstructive pulmonary disease, unspecified: Secondary | ICD-10-CM | POA: Diagnosis not present

## 2018-12-12 DIAGNOSIS — M810 Age-related osteoporosis without current pathological fracture: Secondary | ICD-10-CM | POA: Diagnosis not present

## 2018-12-12 DIAGNOSIS — I69398 Other sequelae of cerebral infarction: Secondary | ICD-10-CM | POA: Diagnosis not present

## 2018-12-12 DIAGNOSIS — R42 Dizziness and giddiness: Secondary | ICD-10-CM | POA: Diagnosis not present

## 2018-12-12 DIAGNOSIS — Z9181 History of falling: Secondary | ICD-10-CM | POA: Diagnosis not present

## 2018-12-12 DIAGNOSIS — R531 Weakness: Secondary | ICD-10-CM | POA: Diagnosis not present

## 2018-12-12 DIAGNOSIS — E039 Hypothyroidism, unspecified: Secondary | ICD-10-CM | POA: Diagnosis not present

## 2018-12-12 DIAGNOSIS — Z792 Long term (current) use of antibiotics: Secondary | ICD-10-CM | POA: Diagnosis not present

## 2018-12-12 DIAGNOSIS — M21371 Foot drop, right foot: Secondary | ICD-10-CM | POA: Diagnosis not present

## 2018-12-12 DIAGNOSIS — Z7982 Long term (current) use of aspirin: Secondary | ICD-10-CM | POA: Diagnosis not present

## 2018-12-12 DIAGNOSIS — M1990 Unspecified osteoarthritis, unspecified site: Secondary | ICD-10-CM | POA: Diagnosis not present

## 2018-12-13 DIAGNOSIS — Z7982 Long term (current) use of aspirin: Secondary | ICD-10-CM | POA: Diagnosis not present

## 2018-12-13 DIAGNOSIS — E039 Hypothyroidism, unspecified: Secondary | ICD-10-CM | POA: Diagnosis not present

## 2018-12-13 DIAGNOSIS — Z79891 Long term (current) use of opiate analgesic: Secondary | ICD-10-CM | POA: Diagnosis not present

## 2018-12-13 DIAGNOSIS — M1990 Unspecified osteoarthritis, unspecified site: Secondary | ICD-10-CM | POA: Diagnosis not present

## 2018-12-13 DIAGNOSIS — R42 Dizziness and giddiness: Secondary | ICD-10-CM | POA: Diagnosis not present

## 2018-12-13 DIAGNOSIS — R531 Weakness: Secondary | ICD-10-CM | POA: Diagnosis not present

## 2018-12-13 DIAGNOSIS — M810 Age-related osteoporosis without current pathological fracture: Secondary | ICD-10-CM | POA: Diagnosis not present

## 2018-12-13 DIAGNOSIS — Z9181 History of falling: Secondary | ICD-10-CM | POA: Diagnosis not present

## 2018-12-13 DIAGNOSIS — M21371 Foot drop, right foot: Secondary | ICD-10-CM | POA: Diagnosis not present

## 2018-12-13 DIAGNOSIS — Z792 Long term (current) use of antibiotics: Secondary | ICD-10-CM | POA: Diagnosis not present

## 2018-12-13 DIAGNOSIS — I69398 Other sequelae of cerebral infarction: Secondary | ICD-10-CM | POA: Diagnosis not present

## 2018-12-13 DIAGNOSIS — E785 Hyperlipidemia, unspecified: Secondary | ICD-10-CM | POA: Diagnosis not present

## 2018-12-13 DIAGNOSIS — J449 Chronic obstructive pulmonary disease, unspecified: Secondary | ICD-10-CM | POA: Diagnosis not present

## 2018-12-17 ENCOUNTER — Encounter (HOSPITAL_COMMUNITY): Payer: Self-pay

## 2018-12-17 ENCOUNTER — Emergency Department (HOSPITAL_COMMUNITY): Payer: Medicare Other

## 2018-12-17 ENCOUNTER — Other Ambulatory Visit: Payer: Self-pay

## 2018-12-17 ENCOUNTER — Emergency Department (HOSPITAL_COMMUNITY)
Admission: EM | Admit: 2018-12-17 | Discharge: 2018-12-17 | Disposition: A | Discharge status: Home / Self Care | Payer: Medicare Other | Attending: Emergency Medicine | Admitting: Emergency Medicine

## 2018-12-17 DIAGNOSIS — I69398 Other sequelae of cerebral infarction: Secondary | ICD-10-CM | POA: Diagnosis not present

## 2018-12-17 DIAGNOSIS — N3090 Cystitis, unspecified without hematuria: Secondary | ICD-10-CM | POA: Diagnosis not present

## 2018-12-17 DIAGNOSIS — E039 Hypothyroidism, unspecified: Secondary | ICD-10-CM | POA: Diagnosis not present

## 2018-12-17 DIAGNOSIS — J449 Chronic obstructive pulmonary disease, unspecified: Secondary | ICD-10-CM | POA: Diagnosis not present

## 2018-12-17 DIAGNOSIS — Z79899 Other long term (current) drug therapy: Secondary | ICD-10-CM | POA: Diagnosis not present

## 2018-12-17 DIAGNOSIS — Z9181 History of falling: Secondary | ICD-10-CM | POA: Diagnosis not present

## 2018-12-17 DIAGNOSIS — M21371 Foot drop, right foot: Secondary | ICD-10-CM | POA: Diagnosis not present

## 2018-12-17 DIAGNOSIS — E785 Hyperlipidemia, unspecified: Secondary | ICD-10-CM | POA: Diagnosis not present

## 2018-12-17 DIAGNOSIS — M1990 Unspecified osteoarthritis, unspecified site: Secondary | ICD-10-CM | POA: Diagnosis not present

## 2018-12-17 DIAGNOSIS — Z7982 Long term (current) use of aspirin: Secondary | ICD-10-CM | POA: Diagnosis not present

## 2018-12-17 DIAGNOSIS — R42 Dizziness and giddiness: Secondary | ICD-10-CM | POA: Diagnosis not present

## 2018-12-17 DIAGNOSIS — F17210 Nicotine dependence, cigarettes, uncomplicated: Secondary | ICD-10-CM | POA: Insufficient documentation

## 2018-12-17 DIAGNOSIS — M810 Age-related osteoporosis without current pathological fracture: Secondary | ICD-10-CM | POA: Diagnosis not present

## 2018-12-17 DIAGNOSIS — Z79891 Long term (current) use of opiate analgesic: Secondary | ICD-10-CM | POA: Diagnosis not present

## 2018-12-17 DIAGNOSIS — R531 Weakness: Secondary | ICD-10-CM | POA: Diagnosis not present

## 2018-12-17 DIAGNOSIS — M79604 Pain in right leg: Secondary | ICD-10-CM | POA: Diagnosis not present

## 2018-12-17 DIAGNOSIS — Z96651 Presence of right artificial knee joint: Secondary | ICD-10-CM | POA: Diagnosis not present

## 2018-12-17 DIAGNOSIS — Z792 Long term (current) use of antibiotics: Secondary | ICD-10-CM | POA: Diagnosis not present

## 2018-12-17 LAB — COMPREHENSIVE METABOLIC PANEL
ALT: 16 U/L (ref 0–44)
AST: 18 U/L (ref 15–41)
Albumin: 3.4 g/dL — ABNORMAL LOW (ref 3.5–5.0)
Alkaline Phosphatase: 157 U/L — ABNORMAL HIGH (ref 38–126)
Anion gap: 9 (ref 5–15)
BUN: 12 mg/dL (ref 8–23)
CO2: 26 mmol/L (ref 22–32)
Calcium: 8.4 mg/dL — ABNORMAL LOW (ref 8.9–10.3)
Chloride: 104 mmol/L (ref 98–111)
Creatinine, Ser: 1.38 mg/dL — ABNORMAL HIGH (ref 0.44–1.00)
GFR calc Af Amer: 43 mL/min — ABNORMAL LOW (ref >60)
GFR calc non Af Amer: 37 mL/min — ABNORMAL LOW (ref >60)
Glucose, Bld: 105 mg/dL — ABNORMAL HIGH (ref 70–99)
Potassium: 4 mmol/L (ref 3.5–5.1)
Sodium: 139 mmol/L (ref 135–145)
Total Bilirubin: 0.3 mg/dL (ref 0.3–1.2)
Total Protein: 7 g/dL (ref 6.5–8.1)

## 2018-12-17 LAB — CBC WITH DIFFERENTIAL/PLATELET
Abs Immature Granulocytes: 0.02 10*3/uL (ref 0.00–0.07)
Basophils Absolute: 0 10*3/uL (ref 0.0–0.1)
Basophils Relative: 1 %
Eosinophils Absolute: 0.1 10*3/uL (ref 0.0–0.5)
Eosinophils Relative: 2 %
HCT: 39.8 % (ref 36.0–46.0)
Hemoglobin: 12.8 g/dL (ref 12.0–15.0)
Immature Granulocytes: 0 %
Lymphocytes Relative: 17 %
Lymphs Abs: 1.1 10*3/uL (ref 0.7–4.0)
MCH: 30.3 pg (ref 26.0–34.0)
MCHC: 32.2 g/dL (ref 30.0–36.0)
MCV: 94.1 fL (ref 80.0–100.0)
Monocytes Absolute: 0.6 10*3/uL (ref 0.1–1.0)
Monocytes Relative: 8 %
Neutro Abs: 4.8 10*3/uL (ref 1.7–7.7)
Neutrophils Relative %: 72 %
Platelets: 237 10*3/uL (ref 150–400)
RBC: 4.23 MIL/uL (ref 3.87–5.11)
RDW: 14.3 % (ref 11.5–15.5)
WBC: 6.6 10*3/uL (ref 4.0–10.5)
nRBC: 0 % (ref 0.0–0.2)

## 2018-12-17 LAB — URINALYSIS, ROUTINE W REFLEX MICROSCOPIC
Bilirubin Urine: NEGATIVE
Glucose, UA: NEGATIVE mg/dL
Hgb urine dipstick: NEGATIVE
Ketones, ur: NEGATIVE mg/dL
Nitrite: NEGATIVE
Protein, ur: NEGATIVE mg/dL
Specific Gravity, Urine: 1.009 (ref 1.005–1.030)
WBC, UA: >50 WBC/hpf — ABNORMAL HIGH (ref 0–5)
pH: 5 (ref 5.0–8.0)

## 2018-12-17 LAB — LACTIC ACID, PLASMA
Lactic Acid, Venous: 1.2 mmol/L (ref 0.5–1.9)
Lactic Acid, Venous: 1.1 mmol/L (ref 0.5–1.9)

## 2018-12-17 MED ORDER — FOSFOMYCIN TROMETHAMINE 3 G PO PACK
3.0000 g | PACK | Freq: Once | ORAL | Status: AC
  Administered 2018-12-17: 3 g via ORAL
  Filled 2018-12-17: qty 3

## 2018-12-17 MED ORDER — NITROFURANTOIN MONOHYD MACRO 100 MG PO CAPS: 100.0000 mg | ORAL_CAPSULE | Freq: Two times a day (BID) | ORAL | 0 refills | Status: AC

## 2018-12-17 MED ORDER — FOSFOMYCIN TROMETHAMINE 3 G PO PACK: 3.0000 g | PACK | Freq: Once | ORAL | 0 refills | Status: DC

## 2018-12-17 NOTE — Discharge Instructions (Addendum)
You have been seen in the Emergency Department (ED) today for pain when urinating.  Your workup today suggests that you have a urinary tract infection (UTI).  Please take Tylenol as needed for pain, but no more than recommended on the label instructions.  Drink PLENTY of fluids.  Prescription has been sent to your pharmacy for Macrobid.  This is an antibiotic used to treat urinary tract infections.  Please take as prescribed.  If you continue to have urinary symptoms or weakness it is very important that you follow-up with your primary care doctor.  Call your regular doctor to schedule the next available appointment to follow up on todays ED visit, or return immediately to the ED if your pain worsens, you have decreased urine production, develop fever, persistent vomiting, or other symptoms that concern you.

## 2018-12-17 NOTE — ED Triage Notes (Signed)
Pt reports woke up around 0830 this morning with generalized weakness.  Reports has chronic r knee pain but worse today.  No new injury>  Denies any cough, fever, sob, n/v/d.

## 2018-12-17 NOTE — ED Provider Notes (Signed)
[facility] EMERGENCY DEPARTMENT Provider Note   CSN: [id number] Arrival date & time: 12/17/18  1339    History   Chief Complaint Chief Complaint  Patient presents with  . Weakness    HPI Amanda Armstrong is a 73 y.o. female with medical history significant for chronic pain, CVA, hypertension presents emergency department today with chief complaint of generalized weakness x 1 day.  Patient states when she woke up this morning she does not feel like her normal self.  When she was ambulating to the bathroom she felt weak and her granddaughter had to help her sit down in the wheelchair.  She did not fall.  The dizziness resolved after several minutes without intervention. she denies any fever, chills, cough, shortness of breath, chest pain, nausea, vomiting, diarrhea, urinary symptoms.  She denies any sick contacts.  She lives at home with her husband and granddaughter.  Review shows patient was here on 11/26/2018 with dysuria, found to have UTI and treated with Keflex.  She took Keflex as prescribed and reports urinary symptoms completely resolved.  Past Medical History:  Diagnosis Date  . Anxiety   . Arthritis    Bilateral Knee DJD  . Chronic pain   . Chronic pain   . Chronically on opiate therapy   . Clostridium difficile carrier    pt stated that she has intermittent sx  . Clostridium difficile infection   . Daytime somnolence   . Frequent falls   . Headache(784.0)    SINUS HEADACHE OCCASSIONALLY  . Hypertension   . Hypothyroidism   . Multifactorial gait disorder   . Osteoporosis   . Right foot drop   . Snoring   . Stroke (HCC)   . Tobacco use   . Vertigo   . Weakness     Patient Active Problem List   Diagnosis Date Noted  . UTI (urinary tract infection) 01/29/2016  . Nicotine dependence 09/07/2015  . Enteritis due to Clostridium difficile 08/29/2013  . Weakness 08/02/2013  . Diarrhea 08/02/2013  . Acute pyelonephritis 05/28/2013  . UTI (lower urinary tract  infection) 05/27/2013  . Tachypnea 05/27/2013  . Hyperglycemia 05/27/2013  . Arthritis   . Anxiety   . Hypothyroidism     Past Surgical History:  Procedure Laterality Date  . CATARACT EXTRACTION W/PHACO Right 06/28/2016   Procedure: CATARACT EXTRACTION PHACO AND INTRAOCULAR LENS PLACEMENT (IOC);  Surgeon: Jethro BolusMark Shapiro, MD;  Location: [facility];  Service: Ophthalmology;  Laterality: Right;  CDE: 11.10  . CATARACT EXTRACTION W/PHACO Left 07/26/2016   Procedure: CATARACT EXTRACTION PHACO AND INTRAOCULAR LENS PLACEMENT (IOC);  Surgeon: Jethro BolusMark Shapiro, MD;  Location: [facility];  Service: Ophthalmology;  Laterality: Left;  CDE: 8.88  . JOINT REPLACEMENT  11/08/3010   right total knee  . TOTAL KNEE ARTHROPLASTY  11/07/2011   Procedure: TOTAL KNEE ARTHROPLASTY;  Surgeon: Nilda Simmerobert A Wainer, MD;  Location: [facility] OR;  Service: Orthopedics;  Laterality: Left;  DR Thurston HoleWAINER WANTS 90 MINUTES FOR THIS CASE  . TOTAL KNEE ARTHROPLASTY Right 2011  . TUBAL LIGATION  1982     OB History   No obstetric history on file.      Home Medications    Prior to Admission medications   Medication Sig Start Date End Date Taking? Authorizing Provider  alendronate (FOSAMAX) 70 MG tablet Take 70 mg by mouth every Sunday.  04/05/13  Yes [provider]  ALPRAZolam Prudy Feeler(XANAX) 1 MG tablet Take 1 tablet (1 mg total) by mouth 3 (three) times  daily. Patient taking differently: Take 1 mg by mouth at bedtime.  02/01/16  Yes Erick Blinks, MD  atorvastatin (LIPITOR) 20 MG tablet Take 20 mg by mouth at bedtime. 09/14/15  Yes [provider]  furosemide (LASIX) 20 MG tablet Take 20 mg by mouth 2 (two) times daily.    Yes [provider]  levothyroxine (SYNTHROID) 100 MCG tablet Take 75 mcg by mouth daily before breakfast.  08/29/15  Yes [provider]  lisinopril (ZESTRIL) 20 MG tablet Take 20 mg by mouth 2 (two) times a day.   Yes [provider]  meclizine (ANTIVERT) 25 MG tablet Take 1 tablet (25 mg  total) by mouth 3 (three) times daily as needed for dizziness. 04/13/16  Yes Lavera Guise, MD  sulfamethoxazole-trimethoprim (BACTRIM) 400-80 MG tablet Take 1 tablet by mouth at bedtime.   Yes [provider]  terbinafine (LAMISIL) 250 MG tablet Take 250 mg by mouth every evening.   Yes [provider]  traZODone (DESYREL) 50 MG tablet Take 50 mg by mouth at bedtime.   Yes [provider]  umeclidinium-vilanterol (ANORO ELLIPTA) 62.5-25 MCG/INH AEPB Inhale 1 puff into the lungs daily.   Yes [provider]  cephALEXin (KEFLEX) 500 MG capsule Take 1 capsule (500 mg total) by mouth 4 (four) times daily. Patient not taking: Reported on 12/17/2018 11/26/18   Bethann Berkshire, MD  nitrofurantoin, macrocrystal-monohydrate, (MACROBID) 100 MG capsule Take 1 capsule (100 mg total) by mouth 2 (two) times daily for 5 days. 12/17/18 12/22/18  , Caroleen Hamman, PA-C    Family History Family History  Problem Relation Age of Onset  . Heart attack Mother   . COPD Father   . Arthritis Sister   . Heart disease Brother     Social History Social History   Tobacco Use  . Smoking status: Current Every Day Smoker    Packs/day: 0.50    Years: 48.00    Pack years: 24.00    Types: Cigarettes    Last attempt to quit: 06/15/2012    Years since quitting: 6.5  . Smokeless tobacco: Never Used  . Tobacco comment: 12/29/14 1/2 PPD  Substance Use Topics  . Alcohol use: No    Alcohol/week: 0.0 standard drinks  . Drug use: No     Allergies   Iron; Oxycodone; and Sudafed [pseudoephedrine hcl]   Review of Systems Review of Systems  Constitutional: Negative for chills and fever.  HENT: Negative for congestion, ear discharge, ear pain, sinus pressure, sinus pain and sore throat.   Eyes: Negative for pain and redness.  Respiratory: Negative for cough and shortness of breath.   Cardiovascular: Negative for chest pain.  Gastrointestinal: Negative for abdominal pain, constipation,  diarrhea, nausea and vomiting.  Genitourinary: Negative for dysuria and hematuria.  Musculoskeletal: Negative for back pain and neck pain.  Skin: Negative for wound.  Neurological: Positive for dizziness (has resolved) and weakness. Negative for numbness and headaches.     Physical Exam Updated Vital Signs BP (!) 123/57   Pulse 75   Temp 98.6 F (37 C) (Oral)   Resp 17   Ht  (1.473 m)   Wt 65.8 kg   SpO2 95%   BMI 30.31 kg/m   Physical Exam Vitals signs and nursing note reviewed.  Constitutional:      General: She is not in acute distress.    Appearance: She is not ill-appearing.  HENT:     Head: Normocephalic and atraumatic.  Right Ear: Tympanic membrane and external ear normal.     Left Ear: Tympanic membrane and external ear normal.     Nose: Nose normal.     Mouth/Throat:     Mouth: Mucous membranes are moist.     Pharynx: Oropharynx is clear.  Eyes:     General: No scleral icterus.       Right eye: No discharge.        Left eye: No discharge.     Extraocular Movements: Extraocular movements intact.     Conjunctiva/sclera: Conjunctivae normal.     Pupils: Pupils are equal, round, and reactive to light.  Neck:     Musculoskeletal: Normal range of motion.     Vascular: No JVD.  Cardiovascular:     Rate and Rhythm: Normal rate and regular rhythm.     Pulses: Normal pulses.          Radial pulses are 2+ on the right side and 2+ on the left side.     Heart sounds: Normal heart sounds.  Pulmonary:     Comments: Lungs clear to auscultation in all fields. Symmetric chest rise. No wheezing, rales, or rhonchi. Abdominal:     Comments: Abdomen is soft, non-distended, and non-tender in all quadrants. No rigidity, no guarding. No peritoneal signs.  Musculoskeletal: Normal range of motion.  Skin:    General: Skin is warm and dry.     Capillary Refill: Capillary refill takes less than 2 seconds.  Neurological:     Mental Status: She is oriented to person,  place, and time.     GCS: GCS eye subscore is 4. GCS verbal subscore is 5. GCS motor subscore is 6.     Comments: Fluent speech, no facial droop.  Psychiatric:        Behavior: Behavior normal.      ED Treatments / Results  Labs (all labs ordered are listed, but only abnormal results are displayed) Labs Reviewed  URINALYSIS, ROUTINE W REFLEX MICROSCOPIC - Abnormal; Notable for the following components:      Result Value   APPearance CLOUDY (*)    Leukocytes,Ua LARGE (*)    WBC, UA >50 (*)    Bacteria, UA FEW (*)    All other components within normal limits  COMPREHENSIVE METABOLIC PANEL - Abnormal; Notable for the following components:   Glucose, Bld 105 (*)    Creatinine, Ser 1.38 (*)    Calcium 8.4 (*)    Albumin 3.4 (*)    Alkaline Phosphatase 157 (*)    GFR calc non Af Amer 37 (*)    GFR calc Af Amer 43 (*)    All other components within normal limits  LACTIC ACID, PLASMA  LACTIC ACID, PLASMA  CBC WITH DIFFERENTIAL/PLATELET    EKG EKG Interpretation  Date/Time:  Monday December 17 2018 14:27:01 EDT Ventricular Rate:  86 PR Interval:    QRS Duration: 90 QT Interval:  378 QTC Calculation: 453 R Axis:   26 Text Interpretation:  Sinus rhythm Low voltage, extremity and precordial leads Baseline wander in lead(s) III Confirmed by Kennis Carina [phone]) on 12/17/2018 3:35:38 PM   Radiology Dg Chest 2 View  Result Date: 12/17/2018 CLINICAL DATA:  Weakness. EXAM: CHEST - 2 VIEW COMPARISON:  Radiographs of July 29, 2018 FINDINGS: The heart size and mediastinal contours are within normal limits. Both lungs are clear. The visualized skeletal structures are unremarkable. IMPRESSION: No active cardiopulmonary disease. Electronically Signed   By: Lupita Raider  M.D.   On: 12/17/2018 14:57    Procedures Procedures (including critical care time)  Medications Ordered in ED Medications  fosfomycin (MONUROL) packet 3 g (3 g Oral Given 12/17/18 1615)     Initial Impression /  Assessment and Plan / ED Course  I have reviewed the triage vital signs and the nursing notes.  Pertinent labs & imaging results that were available during my care of the patient were reviewed by me and considered in my medical decision making (see chart for details).  Patient is afebrile, well-appearing. Her exam is overall benign, neuro exam without deficit. CBC unremarkable, lactic acid of 1.2, within normal limits.  CMP shows improvement in creatinine compared to recent visit.  UA with signs of infection including large leukocytes, over 50 WBC and few bacteria. On reassessment she has no CVA tenderness, nontender abdomen and continues to deny nausea and vomiting. Chart review shows urine culture on 11/26/18 grew 80,000 colonies/mL e coli and >=100k colonies/mL aerococcus urinae. Discussed with hopsital pharmacist for treatment recommendation as only susceptibilities of e coli are reported.  Recommendation is broad-spectrum coverage with fosfomycin today while in the emergency department and to be repeated in 72 hours. When prescribing the second dose, pt's insurance does not cover it.  I discussed this with pt and she states she is unable to afford any expensive medications. She is requesting another antibiotic. Prescription sent to her pharmacy for macrobid, as previous culture reports sensitivity. Instructions to follow up with PCP if symptoms persist discussed at length with pt. She reports understanding and agrees with this plan, she has no further questions. Strict ED return precautions given. The patient was discussed with and seen by Dr. Pilar Plate who agrees with the treatment plan.   This note was prepared using Dragon voice recognition software and may include unintentional dictation errors due to the inherent limitations of voice recognition software.    Final Clinical Impressions(s) / ED Diagnoses   Final diagnoses:  Cystitis    ED Discharge Orders         Ordered    fosfomycin  (MONUROL) 3 g PACK   Once,   Status:  Discontinued     12/17/18 1728    nitrofurantoin, macrocrystal-monohydrate, (MACROBID) 100 MG capsule  2 times daily     12/17/18 1732           Sherene Sires, PA-C 12/17/18 1752    Sabas Sous, MD 12/22/18 [phone]

## 2018-12-18 DIAGNOSIS — M1990 Unspecified osteoarthritis, unspecified site: Secondary | ICD-10-CM | POA: Diagnosis not present

## 2018-12-18 DIAGNOSIS — J449 Chronic obstructive pulmonary disease, unspecified: Secondary | ICD-10-CM | POA: Diagnosis not present

## 2018-12-18 DIAGNOSIS — M810 Age-related osteoporosis without current pathological fracture: Secondary | ICD-10-CM | POA: Diagnosis not present

## 2018-12-18 DIAGNOSIS — M21371 Foot drop, right foot: Secondary | ICD-10-CM | POA: Diagnosis not present

## 2018-12-18 DIAGNOSIS — Z79891 Long term (current) use of opiate analgesic: Secondary | ICD-10-CM | POA: Diagnosis not present

## 2018-12-18 DIAGNOSIS — Z7982 Long term (current) use of aspirin: Secondary | ICD-10-CM | POA: Diagnosis not present

## 2018-12-18 DIAGNOSIS — Z9181 History of falling: Secondary | ICD-10-CM | POA: Diagnosis not present

## 2018-12-18 DIAGNOSIS — E785 Hyperlipidemia, unspecified: Secondary | ICD-10-CM | POA: Diagnosis not present

## 2018-12-18 DIAGNOSIS — E039 Hypothyroidism, unspecified: Secondary | ICD-10-CM | POA: Diagnosis not present

## 2018-12-18 DIAGNOSIS — R42 Dizziness and giddiness: Secondary | ICD-10-CM | POA: Diagnosis not present

## 2018-12-18 DIAGNOSIS — Z792 Long term (current) use of antibiotics: Secondary | ICD-10-CM | POA: Diagnosis not present

## 2018-12-18 DIAGNOSIS — I69398 Other sequelae of cerebral infarction: Secondary | ICD-10-CM | POA: Diagnosis not present

## 2018-12-18 DIAGNOSIS — R531 Weakness: Secondary | ICD-10-CM | POA: Diagnosis not present

## 2018-12-24 DIAGNOSIS — Z7982 Long term (current) use of aspirin: Secondary | ICD-10-CM | POA: Diagnosis not present

## 2018-12-24 DIAGNOSIS — E039 Hypothyroidism, unspecified: Secondary | ICD-10-CM | POA: Diagnosis not present

## 2018-12-24 DIAGNOSIS — M21371 Foot drop, right foot: Secondary | ICD-10-CM | POA: Diagnosis not present

## 2018-12-24 DIAGNOSIS — M810 Age-related osteoporosis without current pathological fracture: Secondary | ICD-10-CM | POA: Diagnosis not present

## 2018-12-24 DIAGNOSIS — Z79891 Long term (current) use of opiate analgesic: Secondary | ICD-10-CM | POA: Diagnosis not present

## 2018-12-24 DIAGNOSIS — R42 Dizziness and giddiness: Secondary | ICD-10-CM | POA: Diagnosis not present

## 2018-12-24 DIAGNOSIS — I69398 Other sequelae of cerebral infarction: Secondary | ICD-10-CM | POA: Diagnosis not present

## 2018-12-24 DIAGNOSIS — E785 Hyperlipidemia, unspecified: Secondary | ICD-10-CM | POA: Diagnosis not present

## 2018-12-24 DIAGNOSIS — M1990 Unspecified osteoarthritis, unspecified site: Secondary | ICD-10-CM | POA: Diagnosis not present

## 2018-12-24 DIAGNOSIS — Z9181 History of falling: Secondary | ICD-10-CM | POA: Diagnosis not present

## 2018-12-24 DIAGNOSIS — Z792 Long term (current) use of antibiotics: Secondary | ICD-10-CM | POA: Diagnosis not present

## 2018-12-24 DIAGNOSIS — J449 Chronic obstructive pulmonary disease, unspecified: Secondary | ICD-10-CM | POA: Diagnosis not present

## 2018-12-24 DIAGNOSIS — R531 Weakness: Secondary | ICD-10-CM | POA: Diagnosis not present

## 2018-12-26 DIAGNOSIS — M1990 Unspecified osteoarthritis, unspecified site: Secondary | ICD-10-CM | POA: Diagnosis not present

## 2018-12-26 DIAGNOSIS — M21371 Foot drop, right foot: Secondary | ICD-10-CM | POA: Diagnosis not present

## 2018-12-26 DIAGNOSIS — E039 Hypothyroidism, unspecified: Secondary | ICD-10-CM | POA: Diagnosis not present

## 2018-12-26 DIAGNOSIS — I69398 Other sequelae of cerebral infarction: Secondary | ICD-10-CM | POA: Diagnosis not present

## 2018-12-26 DIAGNOSIS — Z79891 Long term (current) use of opiate analgesic: Secondary | ICD-10-CM | POA: Diagnosis not present

## 2018-12-26 DIAGNOSIS — R531 Weakness: Secondary | ICD-10-CM | POA: Diagnosis not present

## 2018-12-26 DIAGNOSIS — Z9181 History of falling: Secondary | ICD-10-CM | POA: Diagnosis not present

## 2018-12-26 DIAGNOSIS — Z7982 Long term (current) use of aspirin: Secondary | ICD-10-CM | POA: Diagnosis not present

## 2018-12-26 DIAGNOSIS — J449 Chronic obstructive pulmonary disease, unspecified: Secondary | ICD-10-CM | POA: Diagnosis not present

## 2018-12-26 DIAGNOSIS — Z792 Long term (current) use of antibiotics: Secondary | ICD-10-CM | POA: Diagnosis not present

## 2018-12-26 DIAGNOSIS — M810 Age-related osteoporosis without current pathological fracture: Secondary | ICD-10-CM | POA: Diagnosis not present

## 2018-12-26 DIAGNOSIS — E785 Hyperlipidemia, unspecified: Secondary | ICD-10-CM | POA: Diagnosis not present

## 2018-12-26 DIAGNOSIS — R42 Dizziness and giddiness: Secondary | ICD-10-CM | POA: Diagnosis not present

## 2018-12-31 DIAGNOSIS — E785 Hyperlipidemia, unspecified: Secondary | ICD-10-CM | POA: Diagnosis not present

## 2018-12-31 DIAGNOSIS — Z792 Long term (current) use of antibiotics: Secondary | ICD-10-CM | POA: Diagnosis not present

## 2018-12-31 DIAGNOSIS — R42 Dizziness and giddiness: Secondary | ICD-10-CM | POA: Diagnosis not present

## 2018-12-31 DIAGNOSIS — E039 Hypothyroidism, unspecified: Secondary | ICD-10-CM | POA: Diagnosis not present

## 2018-12-31 DIAGNOSIS — R531 Weakness: Secondary | ICD-10-CM | POA: Diagnosis not present

## 2018-12-31 DIAGNOSIS — M1990 Unspecified osteoarthritis, unspecified site: Secondary | ICD-10-CM | POA: Diagnosis not present

## 2018-12-31 DIAGNOSIS — M21371 Foot drop, right foot: Secondary | ICD-10-CM | POA: Diagnosis not present

## 2018-12-31 DIAGNOSIS — M810 Age-related osteoporosis without current pathological fracture: Secondary | ICD-10-CM | POA: Diagnosis not present

## 2018-12-31 DIAGNOSIS — Z7982 Long term (current) use of aspirin: Secondary | ICD-10-CM | POA: Diagnosis not present

## 2018-12-31 DIAGNOSIS — Z79891 Long term (current) use of opiate analgesic: Secondary | ICD-10-CM | POA: Diagnosis not present

## 2018-12-31 DIAGNOSIS — Z9181 History of falling: Secondary | ICD-10-CM | POA: Diagnosis not present

## 2018-12-31 DIAGNOSIS — J449 Chronic obstructive pulmonary disease, unspecified: Secondary | ICD-10-CM | POA: Diagnosis not present

## 2018-12-31 DIAGNOSIS — I69398 Other sequelae of cerebral infarction: Secondary | ICD-10-CM | POA: Diagnosis not present

## 2019-01-07 DIAGNOSIS — E785 Hyperlipidemia, unspecified: Secondary | ICD-10-CM | POA: Diagnosis not present

## 2019-01-07 DIAGNOSIS — I69398 Other sequelae of cerebral infarction: Secondary | ICD-10-CM | POA: Diagnosis not present

## 2019-01-07 DIAGNOSIS — Z79891 Long term (current) use of opiate analgesic: Secondary | ICD-10-CM | POA: Diagnosis not present

## 2019-01-07 DIAGNOSIS — Z9181 History of falling: Secondary | ICD-10-CM | POA: Diagnosis not present

## 2019-01-07 DIAGNOSIS — Z792 Long term (current) use of antibiotics: Secondary | ICD-10-CM | POA: Diagnosis not present

## 2019-01-07 DIAGNOSIS — J449 Chronic obstructive pulmonary disease, unspecified: Secondary | ICD-10-CM | POA: Diagnosis not present

## 2019-01-07 DIAGNOSIS — Z7982 Long term (current) use of aspirin: Secondary | ICD-10-CM | POA: Diagnosis not present

## 2019-01-07 DIAGNOSIS — M810 Age-related osteoporosis without current pathological fracture: Secondary | ICD-10-CM | POA: Diagnosis not present

## 2019-01-07 DIAGNOSIS — M1990 Unspecified osteoarthritis, unspecified site: Secondary | ICD-10-CM | POA: Diagnosis not present

## 2019-01-07 DIAGNOSIS — E039 Hypothyroidism, unspecified: Secondary | ICD-10-CM | POA: Diagnosis not present

## 2019-01-07 DIAGNOSIS — M21371 Foot drop, right foot: Secondary | ICD-10-CM | POA: Diagnosis not present

## 2019-01-09 DIAGNOSIS — M21371 Foot drop, right foot: Secondary | ICD-10-CM | POA: Diagnosis not present

## 2019-01-09 DIAGNOSIS — M810 Age-related osteoporosis without current pathological fracture: Secondary | ICD-10-CM | POA: Diagnosis not present

## 2019-01-09 DIAGNOSIS — M1990 Unspecified osteoarthritis, unspecified site: Secondary | ICD-10-CM | POA: Diagnosis not present

## 2019-01-09 DIAGNOSIS — Z792 Long term (current) use of antibiotics: Secondary | ICD-10-CM | POA: Diagnosis not present

## 2019-01-09 DIAGNOSIS — E039 Hypothyroidism, unspecified: Secondary | ICD-10-CM | POA: Diagnosis not present

## 2019-01-09 DIAGNOSIS — Z9181 History of falling: Secondary | ICD-10-CM | POA: Diagnosis not present

## 2019-01-09 DIAGNOSIS — E785 Hyperlipidemia, unspecified: Secondary | ICD-10-CM | POA: Diagnosis not present

## 2019-01-09 DIAGNOSIS — Z79891 Long term (current) use of opiate analgesic: Secondary | ICD-10-CM | POA: Diagnosis not present

## 2019-01-09 DIAGNOSIS — Z7982 Long term (current) use of aspirin: Secondary | ICD-10-CM | POA: Diagnosis not present

## 2019-01-09 DIAGNOSIS — I69398 Other sequelae of cerebral infarction: Secondary | ICD-10-CM | POA: Diagnosis not present

## 2019-01-09 DIAGNOSIS — J449 Chronic obstructive pulmonary disease, unspecified: Secondary | ICD-10-CM | POA: Diagnosis not present

## 2019-01-11 ENCOUNTER — Encounter (HOSPITAL_COMMUNITY): Payer: Self-pay | Admitting: Emergency Medicine

## 2019-01-11 ENCOUNTER — Emergency Department (HOSPITAL_COMMUNITY): Payer: Medicare Other

## 2019-01-11 ENCOUNTER — Emergency Department (HOSPITAL_COMMUNITY)
Admission: EM | Admit: 2019-01-11 | Discharge: 2019-01-11 | Disposition: A | Discharge status: Home / Self Care | Payer: Medicare Other | Attending: Emergency Medicine | Admitting: Emergency Medicine

## 2019-01-11 ENCOUNTER — Other Ambulatory Visit: Payer: Self-pay

## 2019-01-11 DIAGNOSIS — Z79899 Other long term (current) drug therapy: Secondary | ICD-10-CM | POA: Diagnosis not present

## 2019-01-11 DIAGNOSIS — F17210 Nicotine dependence, cigarettes, uncomplicated: Secondary | ICD-10-CM | POA: Insufficient documentation

## 2019-01-11 DIAGNOSIS — M25551 Pain in right hip: Secondary | ICD-10-CM | POA: Diagnosis not present

## 2019-01-11 DIAGNOSIS — R52 Pain, unspecified: Secondary | ICD-10-CM | POA: Diagnosis not present

## 2019-01-11 DIAGNOSIS — I10 Essential (primary) hypertension: Secondary | ICD-10-CM | POA: Insufficient documentation

## 2019-01-11 MED ORDER — FAMOTIDINE 20 MG PO TABS: 20.0000 mg | ORAL_TABLET | Freq: Two times a day (BID) | ORAL | 0 refills | Status: DC

## 2019-01-11 MED ORDER — FAMOTIDINE 20 MG PO TABS
20.0000 mg | ORAL_TABLET | Freq: Once | ORAL | Status: AC
  Administered 2019-01-11: 20 mg via ORAL
  Filled 2019-01-11: qty 1

## 2019-01-11 MED ORDER — ACETAMINOPHEN 500 MG PO TABS: 1000.0000 mg | ORAL_TABLET | Freq: Once | ORAL | Status: DC

## 2019-01-11 MED ORDER — METHOCARBAMOL 500 MG PO TABS: 500.0000 mg | ORAL_TABLET | Freq: Two times a day (BID) | ORAL | 0 refills | Status: DC

## 2019-01-11 MED ORDER — IBUPROFEN 400 MG PO TABS: 400.0000 mg | ORAL_TABLET | Freq: Two times a day (BID) | ORAL | 0 refills | Status: DC

## 2019-01-11 MED ORDER — IBUPROFEN 400 MG PO TABS
400.0000 mg | ORAL_TABLET | Freq: Once | ORAL | Status: AC
  Administered 2019-01-11: 400 mg via ORAL
  Filled 2019-01-11: qty 1

## 2019-01-11 MED ORDER — LIDOCAINE 5 % EX PTCH
1.0000 | MEDICATED_PATCH | CUTANEOUS | Status: DC
  Administered 2019-01-11: 1 via TRANSDERMAL
  Filled 2019-01-11: qty 1

## 2019-01-11 NOTE — ED Triage Notes (Signed)
Patient brought in via EMS for R hip pain that started yesterday. No known injury.

## 2019-01-11 NOTE — ED Provider Notes (Signed)
Patient evaluated, complains of right sided anterior hip pain which started within the last 24 hours, gradual in onset, persistent, seems to be worse with standing walking flexing and extending the hip.  She has absolutely no pain with palpation laterally or with range of motion passively of the hip in a rotation.  She does have pain with forced flexion of the hip or forced extension of the hip.  There is no redness swelling or warmth there, no abscess, no hernia.  Patient likely has musculoskeletal type pain, NSAID, muscle relaxer, follow-up with orthopedics, patient agreeable.  Medical screening examination/treatment/procedure(s) were conducted as a shared visit with non-physician practitioner(s) and myself.  I personally evaluated the patient during the encounter.  Clinical Impression:   Final diagnoses:  Right hip pain         Amanda Chapel, MD 01/12/19 [phone]

## 2019-01-11 NOTE — Discharge Instructions (Signed)
You can use alternating ice and heat over the right hip, over-the-counter lidocaine patches and I would like for you to take Motrin 400 mg twice daily, I would like for you to take this with Pepcid twice daily any stomach irritation.  Robaxin as needed for muscle spasm, this can cause drowsiness, please do not drive while using this medication. Make sure you take these medications with food.  You can use Tylenol in addition to these medications.  If your pain is not improving I would like for you to follow-up with your PCP and/or Dr. Aline Brochure with orthopedics.  If you develop fevers, significantly worsened hip pain, redness or swelling over the hip, numbness or weakness in the leg or any other new or concerning symptoms return for reevaluation.

## 2019-01-11 NOTE — ED Provider Notes (Signed)
[facility] EMERGENCY DEPARTMENT Provider Note   CSN: [id number] Arrival date & time: 01/11/19  1331    History   Chief Complaint Chief Complaint  Patient presents with  . Hip Pain    HPI Amanda Armstrong is a 73 y.o. female.     Amanda Armstrong is a 73 y.o. female with a history of chronic pain, osteoarthritis, frequent falls, stroke, hypertension, who presents to the emergency department for evaluation of right hip pain.  She reports pain started hurting yesterday, no strenuous activity or fall.  She reports since then has been persistently painful over the lateral aspect of the right hip.  She has not noticed any redness swelling or skin changes.  She reports pain is worse with weightbearing and movement.  No pain in the back, she reports pain does radiate down the leg.  She denies any numbness tingling or weakness in the leg.  No loss of bowel or bladder control or saddle anesthesia.  No discoloration of the leg.  She reports that her husband gave her some pain medication, she does not know what he gave her but it did not help, she has not tried anything else to treat this pain.  Denies prior issues with her hip.  No other aggravating or alleviating factors.     Past Medical History:  Diagnosis Date  . Anxiety   . Arthritis    Bilateral Knee DJD  . Chronic pain   . Chronic pain   . Chronically on opiate therapy   . Clostridium difficile carrier    pt stated that she has intermittent sx  . Clostridium difficile infection   . Daytime somnolence   . Frequent falls   . Headache(784.0)    SINUS HEADACHE OCCASSIONALLY  . Hypertension   . Hypothyroidism   . Multifactorial gait disorder   . Osteoporosis   . Right foot drop   . Snoring   . Stroke (HCC)   . Tobacco use   . Vertigo   . Weakness     Patient Active Problem List   Diagnosis Date Noted  . UTI (urinary tract infection) 01/29/2016  . Nicotine dependence 09/07/2015  . Enteritis due to Clostridium difficile  08/29/2013  . Weakness 08/02/2013  . Diarrhea 08/02/2013  . Acute pyelonephritis 05/28/2013  . UTI (lower urinary tract infection) 05/27/2013  . Tachypnea 05/27/2013  . Hyperglycemia 05/27/2013  . Arthritis   . Anxiety   . Hypothyroidism     Past Surgical History:  Procedure Laterality Date  . CATARACT EXTRACTION W/PHACO Right 06/28/2016   Procedure: CATARACT EXTRACTION PHACO AND INTRAOCULAR LENS PLACEMENT (IOC);  Surgeon: Jethro BolusMark Shapiro, MD;  Location: [facility];  Service: Ophthalmology;  Laterality: Right;  CDE: 11.10  . CATARACT EXTRACTION W/PHACO Left 07/26/2016   Procedure: CATARACT EXTRACTION PHACO AND INTRAOCULAR LENS PLACEMENT (IOC);  Surgeon: Jethro BolusMark Shapiro, MD;  Location: [facility];  Service: Ophthalmology;  Laterality: Left;  CDE: 8.88  . JOINT REPLACEMENT  11/08/3010   right total knee  . TOTAL KNEE ARTHROPLASTY  11/07/2011   Procedure: TOTAL KNEE ARTHROPLASTY;  Surgeon: Nilda Simmerobert A Wainer, MD;  Location: [facility];  Service: Orthopedics;  Laterality: Left;  DR Thurston HoleWAINER WANTS 90 MINUTES FOR THIS CASE  . TOTAL KNEE ARTHROPLASTY Right 2011  . TUBAL LIGATION  1982     OB History    Gravida  4   Para  4   Term  4   Preterm      AB  Living        SAB      TAB      Ectopic      Multiple      Live Births               Home Medications    Prior to Admission medications   Medication Sig Start Date End Date Taking? Authorizing Provider  alendronate (FOSAMAX) 70 MG tablet Take 70 mg by mouth every Sunday.  04/05/13   [provider]  ALPRAZolam Duanne Moron) 1 MG tablet Take 1 tablet (1 mg total) by mouth 3 (three) times daily. Patient taking differently: Take 1 mg by mouth at bedtime.  02/01/16   Kathie Dike, MD  atorvastatin (LIPITOR) 20 MG tablet Take 20 mg by mouth at bedtime. 09/14/15   [provider]  cephALEXin (KEFLEX) 500 MG capsule Take 1 capsule (500 mg total) by mouth 4 (four) times daily. Patient not taking: Reported on 12/17/2018 11/26/18    Milton Ferguson, MD  famotidine (PEPCID) 20 MG tablet Take 1 tablet (20 mg total) by mouth 2 (two) times daily. 01/11/19   Jacqlyn Larsen, PA-C  furosemide (LASIX) 20 MG tablet Take 20 mg by mouth 2 (two) times daily.     [provider]  ibuprofen (ADVIL) 400 MG tablet Take 1 tablet (400 mg total) by mouth 2 (two) times a day. 01/11/19   Jacqlyn Larsen, PA-C  levothyroxine (SYNTHROID) 100 MCG tablet Take 75 mcg by mouth daily before breakfast.  08/29/15   [provider]  lisinopril (ZESTRIL) 20 MG tablet Take 20 mg by mouth 2 (two) times a day.    [provider]  meclizine (ANTIVERT) 25 MG tablet Take 1 tablet (25 mg total) by mouth 3 (three) times daily as needed for dizziness. 04/13/16   Forde Dandy, MD  methocarbamol (ROBAXIN) 500 MG tablet Take 1 tablet (500 mg total) by mouth 2 (two) times daily. 01/11/19   Jacqlyn Larsen, PA-C  sulfamethoxazole-trimethoprim (BACTRIM) 400-80 MG tablet Take 1 tablet by mouth at bedtime.    [provider]  terbinafine (LAMISIL) 250 MG tablet Take 250 mg by mouth every evening.    [provider]  traZODone (DESYREL) 50 MG tablet Take 50 mg by mouth at bedtime.    [provider]  umeclidinium-vilanterol (ANORO ELLIPTA) 62.5-25 MCG/INH AEPB Inhale 1 puff into the lungs daily.    [provider]    Family History Family History  Problem Relation Age of Onset  . Heart attack Mother   . COPD Father   . Arthritis Sister   . Heart disease Brother     Social History Social History   Tobacco Use  . Smoking status: Current Every Day Smoker    Packs/day: 0.50    Years: 48.00    Pack years: 24.00    Types: Cigarettes    Last attempt to quit: 06/15/2012    Years since quitting: 6.5  . Smokeless tobacco: Never Used  . Tobacco comment: 12/29/14 1/2 PPD  Substance Use Topics  . Alcohol use: No    Alcohol/week: 0.0 standard drinks  . Drug use: No     Allergies   Iron, Oxycodone, and Sudafed  [pseudoephedrine hcl]   Review of Systems Review of Systems  Constitutional: Negative for chills and fever.  HENT: Negative.   Respiratory: Negative for shortness of breath.   Cardiovascular: Negative for chest pain.  Gastrointestinal: Negative for abdominal pain, nausea and vomiting.  Genitourinary: Negative for dysuria and flank pain.  Musculoskeletal: Positive for arthralgias. Negative for back pain and myalgias.  Skin: Negative for color change and wound.  Neurological: Negative for weakness and numbness.     Physical Exam Updated Vital Signs BP (!) 110/55 (BP Location: Right Arm)   Pulse 76   Temp 98.2 F (36.8 C) (Oral)   Resp (!) 24   SpO2 95%   Physical Exam Vitals signs and nursing note reviewed.  Constitutional:      General: She is not in acute distress.    Appearance: Normal appearance. She is well-developed and normal weight. She is not ill-appearing or diaphoretic.  HENT:     Head: Normocephalic and atraumatic.  Eyes:     General:        Right eye: No discharge.        Left eye: No discharge.  Pulmonary:     Effort: Pulmonary effort is normal. No respiratory distress.  Abdominal:     General: Abdomen is flat. Bowel sounds are normal. There is no distension.     Palpations: Abdomen is soft. There is no mass.     Tenderness: There is no abdominal tenderness. There is no guarding.     Comments: Abdomen soft, nondistended, nontender to palpation in all quadrants without guarding or peritoneal signs  Musculoskeletal:     Comments: No tenderness over the lumbar spine.  There is tenderness over the right anterior and lateral aspect of the hip with no overlying erythema, warmth, swelling or skin changes, no palpable deformity, pain is worse with anterior flexion of the hip, no pain at the knee or ankle.  2+ DP and TP pulses and the lower extremity is warm and well-perfused.  Normal sensation and 5/5 strength.  Skin:    General: Skin is warm and dry.     Capillary  Refill: Capillary refill takes less than 2 seconds.     Findings: No bruising.  Neurological:     Mental Status: She is alert and oriented to person, place, and time.     Coordination: Coordination normal.     Comments: Alert and oriented, able to follow commands. Bilateral lower extremities with 5/5 strength, equal bilaterally. Sensation intact and equal in bilateral lower extremities.  Psychiatric:        Mood and Affect: Mood normal.        Behavior: Behavior normal.      ED Treatments / Results  Labs (all labs ordered are listed, but only abnormal results are displayed) Labs Reviewed - No data to display  EKG None  Radiology Dg Hip Unilat W Or Wo Pelvis 2-3 Views Right  Result Date: 01/11/2019 CLINICAL DATA:  Acute right hip pain.  No injury. EXAM: DG HIP (WITH OR WITHOUT PELVIS) 2-3V RIGHT COMPARISON:  Right hip x-rays dated January 30, 2016. FINDINGS: There is no evidence of hip fracture or dislocation. There is no evidence of arthropathy or other focal bone abnormality. Osteopenia. Soft tissues are unremarkable. IMPRESSION: No acute osseous abnormality or significant degenerative changes. Electronically Signed   By: Obie DredgeWilliam T Derry M.D.   On: 01/11/2019 14:15    Procedures Procedures (including critical care time)  Medications Ordered in ED Medications  lidocaine (LIDODERM) 5 % 1 patch (1 patch Transdermal Patch Applied 01/11/19 1431)  ibuprofen (ADVIL) tablet 400 mg (400 mg Oral Given 01/11/19 1429)  famotidine (PEPCID) tablet 20 mg (20 mg Oral Given 01/11/19 1429)     Initial Impression / Assessment and Plan /  ED Course  I have reviewed the triage vital signs and the nursing notes.  Pertinent labs & imaging results that were available during my care of the patient were reviewed by me and considered in my medical decision making (see chart for details).  Patient presents with atraumatic right hip pain which started yesterday.  Pain is worse with flexion and extension of  the hip and with weightbearing but patient has been ambulatory.  She has no numbness or weakness, no associated back pain.  No saddle anesthesia or loss of bowel or bladder control.  The hip is without any obvious bony deformity, bruising erythema or warmth on exam.  The lower extremity is neurovascularly intact.  X-rays are unremarkable.  I have low suspicion for infection.  I suspect possible muscular strain or inflammation.  Case discussed with Dr. Hyacinth MeekerMiller who saw and evaluated patient as well, recommends doing short course of NSAID with antacid therapy, will also prescribe short course of muscle relaxer.  Lidocaine patch applied here in the ED.  Discuss plan and medications with patient, appropriate return precautions provided.  Patient to follow-up with PCP/orthopedics if not improving.  Patient expresses understanding and agreement with plan.  Discharged home in good condition.  Final Clinical Impressions(s) / ED Diagnoses   Final diagnoses:  Right hip pain    ED Discharge Orders         Ordered    ibuprofen (ADVIL) 400 MG tablet  2 times daily     01/11/19 1436    famotidine (PEPCID) 20 MG tablet  2 times daily     01/11/19 1436    methocarbamol (ROBAXIN) 500 MG tablet  2 times daily     01/11/19 1436           Dartha LodgeFord, Azusena Erlandson N, [place] JerseyPA-C 01/11/19 1439    Eber HongMiller, Brian, MD 01/12/19 [phone]

## 2019-01-14 DIAGNOSIS — Z792 Long term (current) use of antibiotics: Secondary | ICD-10-CM | POA: Diagnosis not present

## 2019-01-14 DIAGNOSIS — M1990 Unspecified osteoarthritis, unspecified site: Secondary | ICD-10-CM | POA: Diagnosis not present

## 2019-01-14 DIAGNOSIS — M810 Age-related osteoporosis without current pathological fracture: Secondary | ICD-10-CM | POA: Diagnosis not present

## 2019-01-14 DIAGNOSIS — E039 Hypothyroidism, unspecified: Secondary | ICD-10-CM | POA: Diagnosis not present

## 2019-01-14 DIAGNOSIS — E785 Hyperlipidemia, unspecified: Secondary | ICD-10-CM | POA: Diagnosis not present

## 2019-01-14 DIAGNOSIS — Z79891 Long term (current) use of opiate analgesic: Secondary | ICD-10-CM | POA: Diagnosis not present

## 2019-01-14 DIAGNOSIS — J449 Chronic obstructive pulmonary disease, unspecified: Secondary | ICD-10-CM | POA: Diagnosis not present

## 2019-01-14 DIAGNOSIS — Z7982 Long term (current) use of aspirin: Secondary | ICD-10-CM | POA: Diagnosis not present

## 2019-01-14 DIAGNOSIS — I69398 Other sequelae of cerebral infarction: Secondary | ICD-10-CM | POA: Diagnosis not present

## 2019-01-14 DIAGNOSIS — M21371 Foot drop, right foot: Secondary | ICD-10-CM | POA: Diagnosis not present

## 2019-01-14 DIAGNOSIS — Z9181 History of falling: Secondary | ICD-10-CM | POA: Diagnosis not present

## 2019-01-15 DIAGNOSIS — Z1389 Encounter for screening for other disorder: Secondary | ICD-10-CM | POA: Diagnosis not present

## 2019-01-16 DIAGNOSIS — M1990 Unspecified osteoarthritis, unspecified site: Secondary | ICD-10-CM | POA: Diagnosis not present

## 2019-01-16 DIAGNOSIS — E039 Hypothyroidism, unspecified: Secondary | ICD-10-CM | POA: Diagnosis not present

## 2019-01-16 DIAGNOSIS — E785 Hyperlipidemia, unspecified: Secondary | ICD-10-CM | POA: Diagnosis not present

## 2019-01-16 DIAGNOSIS — Z7982 Long term (current) use of aspirin: Secondary | ICD-10-CM | POA: Diagnosis not present

## 2019-01-16 DIAGNOSIS — Z9181 History of falling: Secondary | ICD-10-CM | POA: Diagnosis not present

## 2019-01-16 DIAGNOSIS — M810 Age-related osteoporosis without current pathological fracture: Secondary | ICD-10-CM | POA: Diagnosis not present

## 2019-01-16 DIAGNOSIS — I69398 Other sequelae of cerebral infarction: Secondary | ICD-10-CM | POA: Diagnosis not present

## 2019-01-16 DIAGNOSIS — Z792 Long term (current) use of antibiotics: Secondary | ICD-10-CM | POA: Diagnosis not present

## 2019-01-16 DIAGNOSIS — J449 Chronic obstructive pulmonary disease, unspecified: Secondary | ICD-10-CM | POA: Diagnosis not present

## 2019-01-16 DIAGNOSIS — Z79891 Long term (current) use of opiate analgesic: Secondary | ICD-10-CM | POA: Diagnosis not present

## 2019-01-16 DIAGNOSIS — M21371 Foot drop, right foot: Secondary | ICD-10-CM | POA: Diagnosis not present

## 2019-01-21 DIAGNOSIS — M21371 Foot drop, right foot: Secondary | ICD-10-CM | POA: Diagnosis not present

## 2019-01-21 DIAGNOSIS — E785 Hyperlipidemia, unspecified: Secondary | ICD-10-CM | POA: Diagnosis not present

## 2019-01-21 DIAGNOSIS — E039 Hypothyroidism, unspecified: Secondary | ICD-10-CM | POA: Diagnosis not present

## 2019-01-21 DIAGNOSIS — M810 Age-related osteoporosis without current pathological fracture: Secondary | ICD-10-CM | POA: Diagnosis not present

## 2019-01-21 DIAGNOSIS — Z7982 Long term (current) use of aspirin: Secondary | ICD-10-CM | POA: Diagnosis not present

## 2019-01-21 DIAGNOSIS — M1990 Unspecified osteoarthritis, unspecified site: Secondary | ICD-10-CM | POA: Diagnosis not present

## 2019-01-21 DIAGNOSIS — Z9181 History of falling: Secondary | ICD-10-CM | POA: Diagnosis not present

## 2019-01-21 DIAGNOSIS — J449 Chronic obstructive pulmonary disease, unspecified: Secondary | ICD-10-CM | POA: Diagnosis not present

## 2019-01-21 DIAGNOSIS — Z792 Long term (current) use of antibiotics: Secondary | ICD-10-CM | POA: Diagnosis not present

## 2019-01-21 DIAGNOSIS — I69398 Other sequelae of cerebral infarction: Secondary | ICD-10-CM | POA: Diagnosis not present

## 2019-01-21 DIAGNOSIS — Z79891 Long term (current) use of opiate analgesic: Secondary | ICD-10-CM | POA: Diagnosis not present

## 2019-01-23 DIAGNOSIS — J449 Chronic obstructive pulmonary disease, unspecified: Secondary | ICD-10-CM | POA: Diagnosis not present

## 2019-01-23 DIAGNOSIS — M21371 Foot drop, right foot: Secondary | ICD-10-CM | POA: Diagnosis not present

## 2019-01-23 DIAGNOSIS — M1990 Unspecified osteoarthritis, unspecified site: Secondary | ICD-10-CM | POA: Diagnosis not present

## 2019-01-23 DIAGNOSIS — E039 Hypothyroidism, unspecified: Secondary | ICD-10-CM | POA: Diagnosis not present

## 2019-01-23 DIAGNOSIS — Z9181 History of falling: Secondary | ICD-10-CM | POA: Diagnosis not present

## 2019-01-23 DIAGNOSIS — Z792 Long term (current) use of antibiotics: Secondary | ICD-10-CM | POA: Diagnosis not present

## 2019-01-23 DIAGNOSIS — M810 Age-related osteoporosis without current pathological fracture: Secondary | ICD-10-CM | POA: Diagnosis not present

## 2019-01-23 DIAGNOSIS — I69398 Other sequelae of cerebral infarction: Secondary | ICD-10-CM | POA: Diagnosis not present

## 2019-01-23 DIAGNOSIS — Z7982 Long term (current) use of aspirin: Secondary | ICD-10-CM | POA: Diagnosis not present

## 2019-01-23 DIAGNOSIS — E785 Hyperlipidemia, unspecified: Secondary | ICD-10-CM | POA: Diagnosis not present

## 2019-01-23 DIAGNOSIS — Z79891 Long term (current) use of opiate analgesic: Secondary | ICD-10-CM | POA: Diagnosis not present

## 2019-01-28 DIAGNOSIS — Z9181 History of falling: Secondary | ICD-10-CM | POA: Diagnosis not present

## 2019-01-28 DIAGNOSIS — Z792 Long term (current) use of antibiotics: Secondary | ICD-10-CM | POA: Diagnosis not present

## 2019-01-28 DIAGNOSIS — Z7982 Long term (current) use of aspirin: Secondary | ICD-10-CM | POA: Diagnosis not present

## 2019-01-28 DIAGNOSIS — J449 Chronic obstructive pulmonary disease, unspecified: Secondary | ICD-10-CM | POA: Diagnosis not present

## 2019-01-28 DIAGNOSIS — E785 Hyperlipidemia, unspecified: Secondary | ICD-10-CM | POA: Diagnosis not present

## 2019-01-28 DIAGNOSIS — E039 Hypothyroidism, unspecified: Secondary | ICD-10-CM | POA: Diagnosis not present

## 2019-01-28 DIAGNOSIS — I69398 Other sequelae of cerebral infarction: Secondary | ICD-10-CM | POA: Diagnosis not present

## 2019-01-28 DIAGNOSIS — Z79891 Long term (current) use of opiate analgesic: Secondary | ICD-10-CM | POA: Diagnosis not present

## 2019-01-28 DIAGNOSIS — M810 Age-related osteoporosis without current pathological fracture: Secondary | ICD-10-CM | POA: Diagnosis not present

## 2019-01-28 DIAGNOSIS — M1990 Unspecified osteoarthritis, unspecified site: Secondary | ICD-10-CM | POA: Diagnosis not present

## 2019-01-28 DIAGNOSIS — M21371 Foot drop, right foot: Secondary | ICD-10-CM | POA: Diagnosis not present

## 2019-01-30 DIAGNOSIS — I69398 Other sequelae of cerebral infarction: Secondary | ICD-10-CM | POA: Diagnosis not present

## 2019-01-30 DIAGNOSIS — Z792 Long term (current) use of antibiotics: Secondary | ICD-10-CM | POA: Diagnosis not present

## 2019-01-30 DIAGNOSIS — E039 Hypothyroidism, unspecified: Secondary | ICD-10-CM | POA: Diagnosis not present

## 2019-01-30 DIAGNOSIS — Z7982 Long term (current) use of aspirin: Secondary | ICD-10-CM | POA: Diagnosis not present

## 2019-01-30 DIAGNOSIS — Z79891 Long term (current) use of opiate analgesic: Secondary | ICD-10-CM | POA: Diagnosis not present

## 2019-01-30 DIAGNOSIS — E785 Hyperlipidemia, unspecified: Secondary | ICD-10-CM | POA: Diagnosis not present

## 2019-01-30 DIAGNOSIS — M810 Age-related osteoporosis without current pathological fracture: Secondary | ICD-10-CM | POA: Diagnosis not present

## 2019-01-30 DIAGNOSIS — M1990 Unspecified osteoarthritis, unspecified site: Secondary | ICD-10-CM | POA: Diagnosis not present

## 2019-01-30 DIAGNOSIS — J449 Chronic obstructive pulmonary disease, unspecified: Secondary | ICD-10-CM | POA: Diagnosis not present

## 2019-01-30 DIAGNOSIS — Z9181 History of falling: Secondary | ICD-10-CM | POA: Diagnosis not present

## 2019-01-30 DIAGNOSIS — M21371 Foot drop, right foot: Secondary | ICD-10-CM | POA: Diagnosis not present

## 2019-02-04 DIAGNOSIS — M810 Age-related osteoporosis without current pathological fracture: Secondary | ICD-10-CM | POA: Diagnosis not present

## 2019-02-04 DIAGNOSIS — Z9181 History of falling: Secondary | ICD-10-CM | POA: Diagnosis not present

## 2019-02-04 DIAGNOSIS — E785 Hyperlipidemia, unspecified: Secondary | ICD-10-CM | POA: Diagnosis not present

## 2019-02-04 DIAGNOSIS — E039 Hypothyroidism, unspecified: Secondary | ICD-10-CM | POA: Diagnosis not present

## 2019-02-04 DIAGNOSIS — M21371 Foot drop, right foot: Secondary | ICD-10-CM | POA: Diagnosis not present

## 2019-02-04 DIAGNOSIS — Z7982 Long term (current) use of aspirin: Secondary | ICD-10-CM | POA: Diagnosis not present

## 2019-02-04 DIAGNOSIS — J449 Chronic obstructive pulmonary disease, unspecified: Secondary | ICD-10-CM | POA: Diagnosis not present

## 2019-02-04 DIAGNOSIS — I69398 Other sequelae of cerebral infarction: Secondary | ICD-10-CM | POA: Diagnosis not present

## 2019-02-04 DIAGNOSIS — Z79891 Long term (current) use of opiate analgesic: Secondary | ICD-10-CM | POA: Diagnosis not present

## 2019-02-04 DIAGNOSIS — M1990 Unspecified osteoarthritis, unspecified site: Secondary | ICD-10-CM | POA: Diagnosis not present

## 2019-02-04 DIAGNOSIS — Z792 Long term (current) use of antibiotics: Secondary | ICD-10-CM | POA: Diagnosis not present

## 2019-02-06 DIAGNOSIS — M810 Age-related osteoporosis without current pathological fracture: Secondary | ICD-10-CM | POA: Diagnosis not present

## 2019-02-06 DIAGNOSIS — M21371 Foot drop, right foot: Secondary | ICD-10-CM | POA: Diagnosis not present

## 2019-02-06 DIAGNOSIS — E039 Hypothyroidism, unspecified: Secondary | ICD-10-CM | POA: Diagnosis not present

## 2019-02-06 DIAGNOSIS — E785 Hyperlipidemia, unspecified: Secondary | ICD-10-CM | POA: Diagnosis not present

## 2019-02-06 DIAGNOSIS — M1990 Unspecified osteoarthritis, unspecified site: Secondary | ICD-10-CM | POA: Diagnosis not present

## 2019-02-06 DIAGNOSIS — Z9181 History of falling: Secondary | ICD-10-CM | POA: Diagnosis not present

## 2019-02-06 DIAGNOSIS — Z7982 Long term (current) use of aspirin: Secondary | ICD-10-CM | POA: Diagnosis not present

## 2019-02-06 DIAGNOSIS — Z79891 Long term (current) use of opiate analgesic: Secondary | ICD-10-CM | POA: Diagnosis not present

## 2019-02-06 DIAGNOSIS — J449 Chronic obstructive pulmonary disease, unspecified: Secondary | ICD-10-CM | POA: Diagnosis not present

## 2019-02-06 DIAGNOSIS — I69398 Other sequelae of cerebral infarction: Secondary | ICD-10-CM | POA: Diagnosis not present

## 2019-02-06 DIAGNOSIS — Z792 Long term (current) use of antibiotics: Secondary | ICD-10-CM | POA: Diagnosis not present

## 2019-02-11 DIAGNOSIS — Z9181 History of falling: Secondary | ICD-10-CM | POA: Diagnosis not present

## 2019-02-11 DIAGNOSIS — M21371 Foot drop, right foot: Secondary | ICD-10-CM | POA: Diagnosis not present

## 2019-02-11 DIAGNOSIS — Z7982 Long term (current) use of aspirin: Secondary | ICD-10-CM | POA: Diagnosis not present

## 2019-02-11 DIAGNOSIS — I69398 Other sequelae of cerebral infarction: Secondary | ICD-10-CM | POA: Diagnosis not present

## 2019-02-11 DIAGNOSIS — E785 Hyperlipidemia, unspecified: Secondary | ICD-10-CM | POA: Diagnosis not present

## 2019-02-11 DIAGNOSIS — Z79891 Long term (current) use of opiate analgesic: Secondary | ICD-10-CM | POA: Diagnosis not present

## 2019-02-11 DIAGNOSIS — M810 Age-related osteoporosis without current pathological fracture: Secondary | ICD-10-CM | POA: Diagnosis not present

## 2019-02-11 DIAGNOSIS — M1990 Unspecified osteoarthritis, unspecified site: Secondary | ICD-10-CM | POA: Diagnosis not present

## 2019-02-11 DIAGNOSIS — E039 Hypothyroidism, unspecified: Secondary | ICD-10-CM | POA: Diagnosis not present

## 2019-02-11 DIAGNOSIS — Z792 Long term (current) use of antibiotics: Secondary | ICD-10-CM | POA: Diagnosis not present

## 2019-02-11 DIAGNOSIS — J449 Chronic obstructive pulmonary disease, unspecified: Secondary | ICD-10-CM | POA: Diagnosis not present

## 2019-02-13 DIAGNOSIS — Z7982 Long term (current) use of aspirin: Secondary | ICD-10-CM | POA: Diagnosis not present

## 2019-02-13 DIAGNOSIS — Z792 Long term (current) use of antibiotics: Secondary | ICD-10-CM | POA: Diagnosis not present

## 2019-02-13 DIAGNOSIS — E039 Hypothyroidism, unspecified: Secondary | ICD-10-CM | POA: Diagnosis not present

## 2019-02-13 DIAGNOSIS — Z9181 History of falling: Secondary | ICD-10-CM | POA: Diagnosis not present

## 2019-02-13 DIAGNOSIS — J449 Chronic obstructive pulmonary disease, unspecified: Secondary | ICD-10-CM | POA: Diagnosis not present

## 2019-02-13 DIAGNOSIS — M810 Age-related osteoporosis without current pathological fracture: Secondary | ICD-10-CM | POA: Diagnosis not present

## 2019-02-13 DIAGNOSIS — Z79891 Long term (current) use of opiate analgesic: Secondary | ICD-10-CM | POA: Diagnosis not present

## 2019-02-13 DIAGNOSIS — M21371 Foot drop, right foot: Secondary | ICD-10-CM | POA: Diagnosis not present

## 2019-02-13 DIAGNOSIS — W19XXXA Unspecified fall, initial encounter: Secondary | ICD-10-CM | POA: Diagnosis not present

## 2019-02-13 DIAGNOSIS — E785 Hyperlipidemia, unspecified: Secondary | ICD-10-CM | POA: Diagnosis not present

## 2019-02-13 DIAGNOSIS — R531 Weakness: Secondary | ICD-10-CM | POA: Diagnosis not present

## 2019-02-13 DIAGNOSIS — I69398 Other sequelae of cerebral infarction: Secondary | ICD-10-CM | POA: Diagnosis not present

## 2019-02-13 DIAGNOSIS — M1990 Unspecified osteoarthritis, unspecified site: Secondary | ICD-10-CM | POA: Diagnosis not present

## 2019-02-18 DIAGNOSIS — E039 Hypothyroidism, unspecified: Secondary | ICD-10-CM | POA: Diagnosis not present

## 2019-02-18 DIAGNOSIS — Z9181 History of falling: Secondary | ICD-10-CM | POA: Diagnosis not present

## 2019-02-18 DIAGNOSIS — Z79891 Long term (current) use of opiate analgesic: Secondary | ICD-10-CM | POA: Diagnosis not present

## 2019-02-18 DIAGNOSIS — Z792 Long term (current) use of antibiotics: Secondary | ICD-10-CM | POA: Diagnosis not present

## 2019-02-18 DIAGNOSIS — Z7982 Long term (current) use of aspirin: Secondary | ICD-10-CM | POA: Diagnosis not present

## 2019-02-18 DIAGNOSIS — I69398 Other sequelae of cerebral infarction: Secondary | ICD-10-CM | POA: Diagnosis not present

## 2019-02-18 DIAGNOSIS — J449 Chronic obstructive pulmonary disease, unspecified: Secondary | ICD-10-CM | POA: Diagnosis not present

## 2019-02-18 DIAGNOSIS — M21371 Foot drop, right foot: Secondary | ICD-10-CM | POA: Diagnosis not present

## 2019-02-18 DIAGNOSIS — M1990 Unspecified osteoarthritis, unspecified site: Secondary | ICD-10-CM | POA: Diagnosis not present

## 2019-02-18 DIAGNOSIS — E785 Hyperlipidemia, unspecified: Secondary | ICD-10-CM | POA: Diagnosis not present

## 2019-02-18 DIAGNOSIS — M810 Age-related osteoporosis without current pathological fracture: Secondary | ICD-10-CM | POA: Diagnosis not present

## 2019-02-20 DIAGNOSIS — I69398 Other sequelae of cerebral infarction: Secondary | ICD-10-CM | POA: Diagnosis not present

## 2019-02-20 DIAGNOSIS — Z79891 Long term (current) use of opiate analgesic: Secondary | ICD-10-CM | POA: Diagnosis not present

## 2019-02-20 DIAGNOSIS — M21371 Foot drop, right foot: Secondary | ICD-10-CM | POA: Diagnosis not present

## 2019-02-20 DIAGNOSIS — M810 Age-related osteoporosis without current pathological fracture: Secondary | ICD-10-CM | POA: Diagnosis not present

## 2019-02-20 DIAGNOSIS — E785 Hyperlipidemia, unspecified: Secondary | ICD-10-CM | POA: Diagnosis not present

## 2019-02-20 DIAGNOSIS — Z9181 History of falling: Secondary | ICD-10-CM | POA: Diagnosis not present

## 2019-02-20 DIAGNOSIS — Z792 Long term (current) use of antibiotics: Secondary | ICD-10-CM | POA: Diagnosis not present

## 2019-02-20 DIAGNOSIS — Z7982 Long term (current) use of aspirin: Secondary | ICD-10-CM | POA: Diagnosis not present

## 2019-02-20 DIAGNOSIS — J449 Chronic obstructive pulmonary disease, unspecified: Secondary | ICD-10-CM | POA: Diagnosis not present

## 2019-02-20 DIAGNOSIS — M1990 Unspecified osteoarthritis, unspecified site: Secondary | ICD-10-CM | POA: Diagnosis not present

## 2019-02-20 DIAGNOSIS — E039 Hypothyroidism, unspecified: Secondary | ICD-10-CM | POA: Diagnosis not present

## 2019-02-25 DIAGNOSIS — M1990 Unspecified osteoarthritis, unspecified site: Secondary | ICD-10-CM | POA: Diagnosis not present

## 2019-02-25 DIAGNOSIS — M21371 Foot drop, right foot: Secondary | ICD-10-CM | POA: Diagnosis not present

## 2019-02-25 DIAGNOSIS — Z9181 History of falling: Secondary | ICD-10-CM | POA: Diagnosis not present

## 2019-02-25 DIAGNOSIS — M810 Age-related osteoporosis without current pathological fracture: Secondary | ICD-10-CM | POA: Diagnosis not present

## 2019-02-25 DIAGNOSIS — Z7982 Long term (current) use of aspirin: Secondary | ICD-10-CM | POA: Diagnosis not present

## 2019-02-25 DIAGNOSIS — J449 Chronic obstructive pulmonary disease, unspecified: Secondary | ICD-10-CM | POA: Diagnosis not present

## 2019-02-25 DIAGNOSIS — Z79891 Long term (current) use of opiate analgesic: Secondary | ICD-10-CM | POA: Diagnosis not present

## 2019-02-25 DIAGNOSIS — I69398 Other sequelae of cerebral infarction: Secondary | ICD-10-CM | POA: Diagnosis not present

## 2019-02-25 DIAGNOSIS — E039 Hypothyroidism, unspecified: Secondary | ICD-10-CM | POA: Diagnosis not present

## 2019-02-25 DIAGNOSIS — E785 Hyperlipidemia, unspecified: Secondary | ICD-10-CM | POA: Diagnosis not present

## 2019-02-25 DIAGNOSIS — Z792 Long term (current) use of antibiotics: Secondary | ICD-10-CM | POA: Diagnosis not present

## 2019-02-27 DIAGNOSIS — I700 Atherosclerosis of aorta: Secondary | ICD-10-CM | POA: Diagnosis not present

## 2019-02-27 DIAGNOSIS — R269 Unspecified abnormalities of gait and mobility: Secondary | ICD-10-CM | POA: Diagnosis not present

## 2019-02-27 DIAGNOSIS — J449 Chronic obstructive pulmonary disease, unspecified: Secondary | ICD-10-CM | POA: Diagnosis not present

## 2019-02-27 DIAGNOSIS — I69398 Other sequelae of cerebral infarction: Secondary | ICD-10-CM | POA: Diagnosis not present

## 2019-02-27 DIAGNOSIS — E785 Hyperlipidemia, unspecified: Secondary | ICD-10-CM | POA: Diagnosis not present

## 2019-02-27 DIAGNOSIS — M1990 Unspecified osteoarthritis, unspecified site: Secondary | ICD-10-CM | POA: Diagnosis not present

## 2019-02-27 DIAGNOSIS — M810 Age-related osteoporosis without current pathological fracture: Secondary | ICD-10-CM | POA: Diagnosis not present

## 2019-02-27 DIAGNOSIS — Z9181 History of falling: Secondary | ICD-10-CM | POA: Diagnosis not present

## 2019-02-27 DIAGNOSIS — E063 Autoimmune thyroiditis: Secondary | ICD-10-CM | POA: Diagnosis not present

## 2019-02-27 DIAGNOSIS — R42 Dizziness and giddiness: Secondary | ICD-10-CM | POA: Diagnosis not present

## 2019-02-27 DIAGNOSIS — Z7982 Long term (current) use of aspirin: Secondary | ICD-10-CM | POA: Diagnosis not present

## 2019-02-27 DIAGNOSIS — Z79891 Long term (current) use of opiate analgesic: Secondary | ICD-10-CM | POA: Diagnosis not present

## 2019-02-27 DIAGNOSIS — M21371 Foot drop, right foot: Secondary | ICD-10-CM | POA: Diagnosis not present

## 2019-02-27 DIAGNOSIS — Z792 Long term (current) use of antibiotics: Secondary | ICD-10-CM | POA: Diagnosis not present

## 2019-02-27 DIAGNOSIS — Z79899 Other long term (current) drug therapy: Secondary | ICD-10-CM | POA: Diagnosis not present

## 2019-02-27 DIAGNOSIS — E039 Hypothyroidism, unspecified: Secondary | ICD-10-CM | POA: Diagnosis not present

## 2019-03-04 DIAGNOSIS — E785 Hyperlipidemia, unspecified: Secondary | ICD-10-CM | POA: Diagnosis not present

## 2019-03-04 DIAGNOSIS — Z7982 Long term (current) use of aspirin: Secondary | ICD-10-CM | POA: Diagnosis not present

## 2019-03-04 DIAGNOSIS — M21371 Foot drop, right foot: Secondary | ICD-10-CM | POA: Diagnosis not present

## 2019-03-04 DIAGNOSIS — J449 Chronic obstructive pulmonary disease, unspecified: Secondary | ICD-10-CM | POA: Diagnosis not present

## 2019-03-04 DIAGNOSIS — M1990 Unspecified osteoarthritis, unspecified site: Secondary | ICD-10-CM | POA: Diagnosis not present

## 2019-03-04 DIAGNOSIS — M810 Age-related osteoporosis without current pathological fracture: Secondary | ICD-10-CM | POA: Diagnosis not present

## 2019-03-04 DIAGNOSIS — Z79891 Long term (current) use of opiate analgesic: Secondary | ICD-10-CM | POA: Diagnosis not present

## 2019-03-04 DIAGNOSIS — I69398 Other sequelae of cerebral infarction: Secondary | ICD-10-CM | POA: Diagnosis not present

## 2019-03-04 DIAGNOSIS — Z9181 History of falling: Secondary | ICD-10-CM | POA: Diagnosis not present

## 2019-03-04 DIAGNOSIS — Z792 Long term (current) use of antibiotics: Secondary | ICD-10-CM | POA: Diagnosis not present

## 2019-03-04 DIAGNOSIS — E039 Hypothyroidism, unspecified: Secondary | ICD-10-CM | POA: Diagnosis not present

## 2019-03-11 DIAGNOSIS — Z9181 History of falling: Secondary | ICD-10-CM | POA: Diagnosis not present

## 2019-03-11 DIAGNOSIS — M1990 Unspecified osteoarthritis, unspecified site: Secondary | ICD-10-CM | POA: Diagnosis not present

## 2019-03-11 DIAGNOSIS — E785 Hyperlipidemia, unspecified: Secondary | ICD-10-CM | POA: Diagnosis not present

## 2019-03-11 DIAGNOSIS — M21371 Foot drop, right foot: Secondary | ICD-10-CM | POA: Diagnosis not present

## 2019-03-11 DIAGNOSIS — N390 Urinary tract infection, site not specified: Secondary | ICD-10-CM | POA: Diagnosis not present

## 2019-03-11 DIAGNOSIS — Z79891 Long term (current) use of opiate analgesic: Secondary | ICD-10-CM | POA: Diagnosis not present

## 2019-03-11 DIAGNOSIS — Z792 Long term (current) use of antibiotics: Secondary | ICD-10-CM | POA: Diagnosis not present

## 2019-03-11 DIAGNOSIS — J449 Chronic obstructive pulmonary disease, unspecified: Secondary | ICD-10-CM | POA: Diagnosis not present

## 2019-03-11 DIAGNOSIS — I69398 Other sequelae of cerebral infarction: Secondary | ICD-10-CM | POA: Diagnosis not present

## 2019-03-11 DIAGNOSIS — Z7982 Long term (current) use of aspirin: Secondary | ICD-10-CM | POA: Diagnosis not present

## 2019-03-11 DIAGNOSIS — E039 Hypothyroidism, unspecified: Secondary | ICD-10-CM | POA: Diagnosis not present

## 2019-03-11 DIAGNOSIS — M810 Age-related osteoporosis without current pathological fracture: Secondary | ICD-10-CM | POA: Diagnosis not present

## 2019-03-13 DIAGNOSIS — Z79891 Long term (current) use of opiate analgesic: Secondary | ICD-10-CM | POA: Diagnosis not present

## 2019-03-13 DIAGNOSIS — Z7982 Long term (current) use of aspirin: Secondary | ICD-10-CM | POA: Diagnosis not present

## 2019-03-13 DIAGNOSIS — M21371 Foot drop, right foot: Secondary | ICD-10-CM | POA: Diagnosis not present

## 2019-03-13 DIAGNOSIS — I69398 Other sequelae of cerebral infarction: Secondary | ICD-10-CM | POA: Diagnosis not present

## 2019-03-13 DIAGNOSIS — E039 Hypothyroidism, unspecified: Secondary | ICD-10-CM | POA: Diagnosis not present

## 2019-03-13 DIAGNOSIS — M1990 Unspecified osteoarthritis, unspecified site: Secondary | ICD-10-CM | POA: Diagnosis not present

## 2019-03-13 DIAGNOSIS — M810 Age-related osteoporosis without current pathological fracture: Secondary | ICD-10-CM | POA: Diagnosis not present

## 2019-03-13 DIAGNOSIS — E785 Hyperlipidemia, unspecified: Secondary | ICD-10-CM | POA: Diagnosis not present

## 2019-03-13 DIAGNOSIS — Z9181 History of falling: Secondary | ICD-10-CM | POA: Diagnosis not present

## 2019-03-13 DIAGNOSIS — N390 Urinary tract infection, site not specified: Secondary | ICD-10-CM | POA: Diagnosis not present

## 2019-03-13 DIAGNOSIS — Z792 Long term (current) use of antibiotics: Secondary | ICD-10-CM | POA: Diagnosis not present

## 2019-03-13 DIAGNOSIS — J449 Chronic obstructive pulmonary disease, unspecified: Secondary | ICD-10-CM | POA: Diagnosis not present

## 2019-03-18 DIAGNOSIS — Z7982 Long term (current) use of aspirin: Secondary | ICD-10-CM | POA: Diagnosis not present

## 2019-03-18 DIAGNOSIS — I69398 Other sequelae of cerebral infarction: Secondary | ICD-10-CM | POA: Diagnosis not present

## 2019-03-18 DIAGNOSIS — N390 Urinary tract infection, site not specified: Secondary | ICD-10-CM | POA: Diagnosis not present

## 2019-03-18 DIAGNOSIS — M21371 Foot drop, right foot: Secondary | ICD-10-CM | POA: Diagnosis not present

## 2019-03-18 DIAGNOSIS — E785 Hyperlipidemia, unspecified: Secondary | ICD-10-CM | POA: Diagnosis not present

## 2019-03-18 DIAGNOSIS — M810 Age-related osteoporosis without current pathological fracture: Secondary | ICD-10-CM | POA: Diagnosis not present

## 2019-03-18 DIAGNOSIS — E039 Hypothyroidism, unspecified: Secondary | ICD-10-CM | POA: Diagnosis not present

## 2019-03-18 DIAGNOSIS — Z792 Long term (current) use of antibiotics: Secondary | ICD-10-CM | POA: Diagnosis not present

## 2019-03-18 DIAGNOSIS — Z79891 Long term (current) use of opiate analgesic: Secondary | ICD-10-CM | POA: Diagnosis not present

## 2019-03-18 DIAGNOSIS — J449 Chronic obstructive pulmonary disease, unspecified: Secondary | ICD-10-CM | POA: Diagnosis not present

## 2019-03-18 DIAGNOSIS — M1990 Unspecified osteoarthritis, unspecified site: Secondary | ICD-10-CM | POA: Diagnosis not present

## 2019-03-18 DIAGNOSIS — Z9181 History of falling: Secondary | ICD-10-CM | POA: Diagnosis not present

## 2019-03-20 DIAGNOSIS — E039 Hypothyroidism, unspecified: Secondary | ICD-10-CM | POA: Diagnosis not present

## 2019-03-20 DIAGNOSIS — I69398 Other sequelae of cerebral infarction: Secondary | ICD-10-CM | POA: Diagnosis not present

## 2019-03-20 DIAGNOSIS — Z9181 History of falling: Secondary | ICD-10-CM | POA: Diagnosis not present

## 2019-03-20 DIAGNOSIS — M1990 Unspecified osteoarthritis, unspecified site: Secondary | ICD-10-CM | POA: Diagnosis not present

## 2019-03-20 DIAGNOSIS — M810 Age-related osteoporosis without current pathological fracture: Secondary | ICD-10-CM | POA: Diagnosis not present

## 2019-03-20 DIAGNOSIS — N390 Urinary tract infection, site not specified: Secondary | ICD-10-CM | POA: Diagnosis not present

## 2019-03-20 DIAGNOSIS — E785 Hyperlipidemia, unspecified: Secondary | ICD-10-CM | POA: Diagnosis not present

## 2019-03-20 DIAGNOSIS — Z792 Long term (current) use of antibiotics: Secondary | ICD-10-CM | POA: Diagnosis not present

## 2019-03-20 DIAGNOSIS — M21371 Foot drop, right foot: Secondary | ICD-10-CM | POA: Diagnosis not present

## 2019-03-20 DIAGNOSIS — Z7982 Long term (current) use of aspirin: Secondary | ICD-10-CM | POA: Diagnosis not present

## 2019-03-20 DIAGNOSIS — J449 Chronic obstructive pulmonary disease, unspecified: Secondary | ICD-10-CM | POA: Diagnosis not present

## 2019-03-20 DIAGNOSIS — Z79891 Long term (current) use of opiate analgesic: Secondary | ICD-10-CM | POA: Diagnosis not present

## 2019-03-26 DIAGNOSIS — Z9181 History of falling: Secondary | ICD-10-CM | POA: Diagnosis not present

## 2019-03-26 DIAGNOSIS — M1990 Unspecified osteoarthritis, unspecified site: Secondary | ICD-10-CM | POA: Diagnosis not present

## 2019-03-26 DIAGNOSIS — N390 Urinary tract infection, site not specified: Secondary | ICD-10-CM | POA: Diagnosis not present

## 2019-03-26 DIAGNOSIS — M810 Age-related osteoporosis without current pathological fracture: Secondary | ICD-10-CM | POA: Diagnosis not present

## 2019-03-26 DIAGNOSIS — M21371 Foot drop, right foot: Secondary | ICD-10-CM | POA: Diagnosis not present

## 2019-03-26 DIAGNOSIS — Z792 Long term (current) use of antibiotics: Secondary | ICD-10-CM | POA: Diagnosis not present

## 2019-03-26 DIAGNOSIS — I69398 Other sequelae of cerebral infarction: Secondary | ICD-10-CM | POA: Diagnosis not present

## 2019-03-26 DIAGNOSIS — J449 Chronic obstructive pulmonary disease, unspecified: Secondary | ICD-10-CM | POA: Diagnosis not present

## 2019-03-26 DIAGNOSIS — E785 Hyperlipidemia, unspecified: Secondary | ICD-10-CM | POA: Diagnosis not present

## 2019-03-26 DIAGNOSIS — Z7982 Long term (current) use of aspirin: Secondary | ICD-10-CM | POA: Diagnosis not present

## 2019-03-26 DIAGNOSIS — E039 Hypothyroidism, unspecified: Secondary | ICD-10-CM | POA: Diagnosis not present

## 2019-03-26 DIAGNOSIS — Z79891 Long term (current) use of opiate analgesic: Secondary | ICD-10-CM | POA: Diagnosis not present

## 2019-03-28 DIAGNOSIS — Z792 Long term (current) use of antibiotics: Secondary | ICD-10-CM | POA: Diagnosis not present

## 2019-03-28 DIAGNOSIS — M1990 Unspecified osteoarthritis, unspecified site: Secondary | ICD-10-CM | POA: Diagnosis not present

## 2019-03-28 DIAGNOSIS — I69398 Other sequelae of cerebral infarction: Secondary | ICD-10-CM | POA: Diagnosis not present

## 2019-03-28 DIAGNOSIS — M810 Age-related osteoporosis without current pathological fracture: Secondary | ICD-10-CM | POA: Diagnosis not present

## 2019-03-28 DIAGNOSIS — Z79891 Long term (current) use of opiate analgesic: Secondary | ICD-10-CM | POA: Diagnosis not present

## 2019-03-28 DIAGNOSIS — Z7982 Long term (current) use of aspirin: Secondary | ICD-10-CM | POA: Diagnosis not present

## 2019-03-28 DIAGNOSIS — E039 Hypothyroidism, unspecified: Secondary | ICD-10-CM | POA: Diagnosis not present

## 2019-03-28 DIAGNOSIS — J449 Chronic obstructive pulmonary disease, unspecified: Secondary | ICD-10-CM | POA: Diagnosis not present

## 2019-03-28 DIAGNOSIS — E785 Hyperlipidemia, unspecified: Secondary | ICD-10-CM | POA: Diagnosis not present

## 2019-03-28 DIAGNOSIS — Z9181 History of falling: Secondary | ICD-10-CM | POA: Diagnosis not present

## 2019-03-28 DIAGNOSIS — N390 Urinary tract infection, site not specified: Secondary | ICD-10-CM | POA: Diagnosis not present

## 2019-03-28 DIAGNOSIS — M21371 Foot drop, right foot: Secondary | ICD-10-CM | POA: Diagnosis not present

## 2019-04-01 DIAGNOSIS — W19XXXA Unspecified fall, initial encounter: Secondary | ICD-10-CM | POA: Diagnosis not present

## 2019-04-01 DIAGNOSIS — R52 Pain, unspecified: Secondary | ICD-10-CM | POA: Diagnosis not present

## 2019-04-02 DIAGNOSIS — N390 Urinary tract infection, site not specified: Secondary | ICD-10-CM | POA: Diagnosis not present

## 2019-04-02 DIAGNOSIS — Z7982 Long term (current) use of aspirin: Secondary | ICD-10-CM | POA: Diagnosis not present

## 2019-04-02 DIAGNOSIS — M1990 Unspecified osteoarthritis, unspecified site: Secondary | ICD-10-CM | POA: Diagnosis not present

## 2019-04-02 DIAGNOSIS — J449 Chronic obstructive pulmonary disease, unspecified: Secondary | ICD-10-CM | POA: Diagnosis not present

## 2019-04-02 DIAGNOSIS — Z9181 History of falling: Secondary | ICD-10-CM | POA: Diagnosis not present

## 2019-04-02 DIAGNOSIS — I69398 Other sequelae of cerebral infarction: Secondary | ICD-10-CM | POA: Diagnosis not present

## 2019-04-02 DIAGNOSIS — Z792 Long term (current) use of antibiotics: Secondary | ICD-10-CM | POA: Diagnosis not present

## 2019-04-02 DIAGNOSIS — Z79891 Long term (current) use of opiate analgesic: Secondary | ICD-10-CM | POA: Diagnosis not present

## 2019-04-02 DIAGNOSIS — M810 Age-related osteoporosis without current pathological fracture: Secondary | ICD-10-CM | POA: Diagnosis not present

## 2019-04-02 DIAGNOSIS — E785 Hyperlipidemia, unspecified: Secondary | ICD-10-CM | POA: Diagnosis not present

## 2019-04-02 DIAGNOSIS — E039 Hypothyroidism, unspecified: Secondary | ICD-10-CM | POA: Diagnosis not present

## 2019-04-02 DIAGNOSIS — M21371 Foot drop, right foot: Secondary | ICD-10-CM | POA: Diagnosis not present

## 2019-04-04 DIAGNOSIS — J449 Chronic obstructive pulmonary disease, unspecified: Secondary | ICD-10-CM | POA: Diagnosis not present

## 2019-04-04 DIAGNOSIS — Z9181 History of falling: Secondary | ICD-10-CM | POA: Diagnosis not present

## 2019-04-04 DIAGNOSIS — E785 Hyperlipidemia, unspecified: Secondary | ICD-10-CM | POA: Diagnosis not present

## 2019-04-04 DIAGNOSIS — M810 Age-related osteoporosis without current pathological fracture: Secondary | ICD-10-CM | POA: Diagnosis not present

## 2019-04-04 DIAGNOSIS — I69398 Other sequelae of cerebral infarction: Secondary | ICD-10-CM | POA: Diagnosis not present

## 2019-04-04 DIAGNOSIS — N390 Urinary tract infection, site not specified: Secondary | ICD-10-CM | POA: Diagnosis not present

## 2019-04-04 DIAGNOSIS — M1990 Unspecified osteoarthritis, unspecified site: Secondary | ICD-10-CM | POA: Diagnosis not present

## 2019-04-04 DIAGNOSIS — Z79891 Long term (current) use of opiate analgesic: Secondary | ICD-10-CM | POA: Diagnosis not present

## 2019-04-04 DIAGNOSIS — M21371 Foot drop, right foot: Secondary | ICD-10-CM | POA: Diagnosis not present

## 2019-04-04 DIAGNOSIS — Z7982 Long term (current) use of aspirin: Secondary | ICD-10-CM | POA: Diagnosis not present

## 2019-04-04 DIAGNOSIS — E039 Hypothyroidism, unspecified: Secondary | ICD-10-CM | POA: Diagnosis not present

## 2019-04-04 DIAGNOSIS — Z792 Long term (current) use of antibiotics: Secondary | ICD-10-CM | POA: Diagnosis not present

## 2019-04-09 DIAGNOSIS — Z79891 Long term (current) use of opiate analgesic: Secondary | ICD-10-CM | POA: Diagnosis not present

## 2019-04-09 DIAGNOSIS — Z9181 History of falling: Secondary | ICD-10-CM | POA: Diagnosis not present

## 2019-04-09 DIAGNOSIS — Z7982 Long term (current) use of aspirin: Secondary | ICD-10-CM | POA: Diagnosis not present

## 2019-04-09 DIAGNOSIS — E785 Hyperlipidemia, unspecified: Secondary | ICD-10-CM | POA: Diagnosis not present

## 2019-04-09 DIAGNOSIS — M810 Age-related osteoporosis without current pathological fracture: Secondary | ICD-10-CM | POA: Diagnosis not present

## 2019-04-09 DIAGNOSIS — Z792 Long term (current) use of antibiotics: Secondary | ICD-10-CM | POA: Diagnosis not present

## 2019-04-09 DIAGNOSIS — N390 Urinary tract infection, site not specified: Secondary | ICD-10-CM | POA: Diagnosis not present

## 2019-04-09 DIAGNOSIS — I69398 Other sequelae of cerebral infarction: Secondary | ICD-10-CM | POA: Diagnosis not present

## 2019-04-09 DIAGNOSIS — M1990 Unspecified osteoarthritis, unspecified site: Secondary | ICD-10-CM | POA: Diagnosis not present

## 2019-04-09 DIAGNOSIS — E039 Hypothyroidism, unspecified: Secondary | ICD-10-CM | POA: Diagnosis not present

## 2019-04-09 DIAGNOSIS — J449 Chronic obstructive pulmonary disease, unspecified: Secondary | ICD-10-CM | POA: Diagnosis not present

## 2019-04-09 DIAGNOSIS — M21371 Foot drop, right foot: Secondary | ICD-10-CM | POA: Diagnosis not present

## 2019-04-10 DIAGNOSIS — T1490XA Injury, unspecified, initial encounter: Secondary | ICD-10-CM | POA: Diagnosis not present

## 2019-04-10 DIAGNOSIS — W19XXXA Unspecified fall, initial encounter: Secondary | ICD-10-CM | POA: Diagnosis not present

## 2019-04-10 DIAGNOSIS — R5381 Other malaise: Secondary | ICD-10-CM | POA: Diagnosis not present

## 2019-04-11 DIAGNOSIS — M1990 Unspecified osteoarthritis, unspecified site: Secondary | ICD-10-CM | POA: Diagnosis not present

## 2019-04-11 DIAGNOSIS — Z79891 Long term (current) use of opiate analgesic: Secondary | ICD-10-CM | POA: Diagnosis not present

## 2019-04-11 DIAGNOSIS — Z7982 Long term (current) use of aspirin: Secondary | ICD-10-CM | POA: Diagnosis not present

## 2019-04-11 DIAGNOSIS — Z792 Long term (current) use of antibiotics: Secondary | ICD-10-CM | POA: Diagnosis not present

## 2019-04-11 DIAGNOSIS — Z9181 History of falling: Secondary | ICD-10-CM | POA: Diagnosis not present

## 2019-04-11 DIAGNOSIS — E785 Hyperlipidemia, unspecified: Secondary | ICD-10-CM | POA: Diagnosis not present

## 2019-04-11 DIAGNOSIS — J449 Chronic obstructive pulmonary disease, unspecified: Secondary | ICD-10-CM | POA: Diagnosis not present

## 2019-04-11 DIAGNOSIS — M810 Age-related osteoporosis without current pathological fracture: Secondary | ICD-10-CM | POA: Diagnosis not present

## 2019-04-11 DIAGNOSIS — I69398 Other sequelae of cerebral infarction: Secondary | ICD-10-CM | POA: Diagnosis not present

## 2019-04-11 DIAGNOSIS — N390 Urinary tract infection, site not specified: Secondary | ICD-10-CM | POA: Diagnosis not present

## 2019-04-11 DIAGNOSIS — M21371 Foot drop, right foot: Secondary | ICD-10-CM | POA: Diagnosis not present

## 2019-04-11 DIAGNOSIS — E039 Hypothyroidism, unspecified: Secondary | ICD-10-CM | POA: Diagnosis not present

## 2019-04-15 DIAGNOSIS — E785 Hyperlipidemia, unspecified: Secondary | ICD-10-CM | POA: Diagnosis not present

## 2019-04-15 DIAGNOSIS — I69398 Other sequelae of cerebral infarction: Secondary | ICD-10-CM | POA: Diagnosis not present

## 2019-04-15 DIAGNOSIS — M1990 Unspecified osteoarthritis, unspecified site: Secondary | ICD-10-CM | POA: Diagnosis not present

## 2019-04-15 DIAGNOSIS — J449 Chronic obstructive pulmonary disease, unspecified: Secondary | ICD-10-CM | POA: Diagnosis not present

## 2019-04-15 DIAGNOSIS — N390 Urinary tract infection, site not specified: Secondary | ICD-10-CM | POA: Diagnosis not present

## 2019-04-15 DIAGNOSIS — E039 Hypothyroidism, unspecified: Secondary | ICD-10-CM | POA: Diagnosis not present

## 2019-04-15 DIAGNOSIS — M810 Age-related osteoporosis without current pathological fracture: Secondary | ICD-10-CM | POA: Diagnosis not present

## 2019-04-15 DIAGNOSIS — Z9181 History of falling: Secondary | ICD-10-CM | POA: Diagnosis not present

## 2019-04-15 DIAGNOSIS — Z7982 Long term (current) use of aspirin: Secondary | ICD-10-CM | POA: Diagnosis not present

## 2019-04-15 DIAGNOSIS — M21371 Foot drop, right foot: Secondary | ICD-10-CM | POA: Diagnosis not present

## 2019-04-15 DIAGNOSIS — Z792 Long term (current) use of antibiotics: Secondary | ICD-10-CM | POA: Diagnosis not present

## 2019-04-15 DIAGNOSIS — Z79891 Long term (current) use of opiate analgesic: Secondary | ICD-10-CM | POA: Diagnosis not present

## 2019-04-17 DIAGNOSIS — N390 Urinary tract infection, site not specified: Secondary | ICD-10-CM | POA: Diagnosis not present

## 2019-04-17 DIAGNOSIS — Z79891 Long term (current) use of opiate analgesic: Secondary | ICD-10-CM | POA: Diagnosis not present

## 2019-04-17 DIAGNOSIS — M1990 Unspecified osteoarthritis, unspecified site: Secondary | ICD-10-CM | POA: Diagnosis not present

## 2019-04-17 DIAGNOSIS — I69398 Other sequelae of cerebral infarction: Secondary | ICD-10-CM | POA: Diagnosis not present

## 2019-04-17 DIAGNOSIS — Z792 Long term (current) use of antibiotics: Secondary | ICD-10-CM | POA: Diagnosis not present

## 2019-04-17 DIAGNOSIS — M810 Age-related osteoporosis without current pathological fracture: Secondary | ICD-10-CM | POA: Diagnosis not present

## 2019-04-17 DIAGNOSIS — Z7982 Long term (current) use of aspirin: Secondary | ICD-10-CM | POA: Diagnosis not present

## 2019-04-17 DIAGNOSIS — M21371 Foot drop, right foot: Secondary | ICD-10-CM | POA: Diagnosis not present

## 2019-04-17 DIAGNOSIS — Z9181 History of falling: Secondary | ICD-10-CM | POA: Diagnosis not present

## 2019-04-17 DIAGNOSIS — E039 Hypothyroidism, unspecified: Secondary | ICD-10-CM | POA: Diagnosis not present

## 2019-04-17 DIAGNOSIS — E785 Hyperlipidemia, unspecified: Secondary | ICD-10-CM | POA: Diagnosis not present

## 2019-04-17 DIAGNOSIS — J449 Chronic obstructive pulmonary disease, unspecified: Secondary | ICD-10-CM | POA: Diagnosis not present

## 2019-04-23 DIAGNOSIS — E785 Hyperlipidemia, unspecified: Secondary | ICD-10-CM | POA: Diagnosis not present

## 2019-04-23 DIAGNOSIS — N390 Urinary tract infection, site not specified: Secondary | ICD-10-CM | POA: Diagnosis not present

## 2019-04-23 DIAGNOSIS — M21371 Foot drop, right foot: Secondary | ICD-10-CM | POA: Diagnosis not present

## 2019-04-23 DIAGNOSIS — J449 Chronic obstructive pulmonary disease, unspecified: Secondary | ICD-10-CM | POA: Diagnosis not present

## 2019-04-23 DIAGNOSIS — Z9181 History of falling: Secondary | ICD-10-CM | POA: Diagnosis not present

## 2019-04-23 DIAGNOSIS — M810 Age-related osteoporosis without current pathological fracture: Secondary | ICD-10-CM | POA: Diagnosis not present

## 2019-04-23 DIAGNOSIS — Z7982 Long term (current) use of aspirin: Secondary | ICD-10-CM | POA: Diagnosis not present

## 2019-04-23 DIAGNOSIS — I69398 Other sequelae of cerebral infarction: Secondary | ICD-10-CM | POA: Diagnosis not present

## 2019-04-23 DIAGNOSIS — E039 Hypothyroidism, unspecified: Secondary | ICD-10-CM | POA: Diagnosis not present

## 2019-04-23 DIAGNOSIS — Z79891 Long term (current) use of opiate analgesic: Secondary | ICD-10-CM | POA: Diagnosis not present

## 2019-04-23 DIAGNOSIS — M1990 Unspecified osteoarthritis, unspecified site: Secondary | ICD-10-CM | POA: Diagnosis not present

## 2019-04-23 DIAGNOSIS — Z792 Long term (current) use of antibiotics: Secondary | ICD-10-CM | POA: Diagnosis not present

## 2019-04-24 DIAGNOSIS — E039 Hypothyroidism, unspecified: Secondary | ICD-10-CM | POA: Diagnosis not present

## 2019-04-24 DIAGNOSIS — I69398 Other sequelae of cerebral infarction: Secondary | ICD-10-CM | POA: Diagnosis not present

## 2019-04-24 DIAGNOSIS — M1990 Unspecified osteoarthritis, unspecified site: Secondary | ICD-10-CM | POA: Diagnosis not present

## 2019-04-24 DIAGNOSIS — E785 Hyperlipidemia, unspecified: Secondary | ICD-10-CM | POA: Diagnosis not present

## 2019-04-24 DIAGNOSIS — Z79891 Long term (current) use of opiate analgesic: Secondary | ICD-10-CM | POA: Diagnosis not present

## 2019-04-24 DIAGNOSIS — M810 Age-related osteoporosis without current pathological fracture: Secondary | ICD-10-CM | POA: Diagnosis not present

## 2019-04-24 DIAGNOSIS — J449 Chronic obstructive pulmonary disease, unspecified: Secondary | ICD-10-CM | POA: Diagnosis not present

## 2019-04-24 DIAGNOSIS — Z7982 Long term (current) use of aspirin: Secondary | ICD-10-CM | POA: Diagnosis not present

## 2019-04-24 DIAGNOSIS — N390 Urinary tract infection, site not specified: Secondary | ICD-10-CM | POA: Diagnosis not present

## 2019-04-24 DIAGNOSIS — Z9181 History of falling: Secondary | ICD-10-CM | POA: Diagnosis not present

## 2019-04-24 DIAGNOSIS — Z792 Long term (current) use of antibiotics: Secondary | ICD-10-CM | POA: Diagnosis not present

## 2019-04-24 DIAGNOSIS — M21371 Foot drop, right foot: Secondary | ICD-10-CM | POA: Diagnosis not present

## 2019-04-29 DIAGNOSIS — Z9181 History of falling: Secondary | ICD-10-CM | POA: Diagnosis not present

## 2019-04-29 DIAGNOSIS — E785 Hyperlipidemia, unspecified: Secondary | ICD-10-CM | POA: Diagnosis not present

## 2019-04-29 DIAGNOSIS — M1990 Unspecified osteoarthritis, unspecified site: Secondary | ICD-10-CM | POA: Diagnosis not present

## 2019-04-29 DIAGNOSIS — I69398 Other sequelae of cerebral infarction: Secondary | ICD-10-CM | POA: Diagnosis not present

## 2019-04-29 DIAGNOSIS — Z792 Long term (current) use of antibiotics: Secondary | ICD-10-CM | POA: Diagnosis not present

## 2019-04-29 DIAGNOSIS — N390 Urinary tract infection, site not specified: Secondary | ICD-10-CM | POA: Diagnosis not present

## 2019-04-29 DIAGNOSIS — Z7982 Long term (current) use of aspirin: Secondary | ICD-10-CM | POA: Diagnosis not present

## 2019-04-29 DIAGNOSIS — J449 Chronic obstructive pulmonary disease, unspecified: Secondary | ICD-10-CM | POA: Diagnosis not present

## 2019-04-29 DIAGNOSIS — E039 Hypothyroidism, unspecified: Secondary | ICD-10-CM | POA: Diagnosis not present

## 2019-04-29 DIAGNOSIS — M810 Age-related osteoporosis without current pathological fracture: Secondary | ICD-10-CM | POA: Diagnosis not present

## 2019-04-29 DIAGNOSIS — M21371 Foot drop, right foot: Secondary | ICD-10-CM | POA: Diagnosis not present

## 2019-04-29 DIAGNOSIS — Z79891 Long term (current) use of opiate analgesic: Secondary | ICD-10-CM | POA: Diagnosis not present

## 2019-05-03 DIAGNOSIS — Z9181 History of falling: Secondary | ICD-10-CM | POA: Diagnosis not present

## 2019-05-03 DIAGNOSIS — E039 Hypothyroidism, unspecified: Secondary | ICD-10-CM | POA: Diagnosis not present

## 2019-05-03 DIAGNOSIS — M810 Age-related osteoporosis without current pathological fracture: Secondary | ICD-10-CM | POA: Diagnosis not present

## 2019-05-03 DIAGNOSIS — N390 Urinary tract infection, site not specified: Secondary | ICD-10-CM | POA: Diagnosis not present

## 2019-05-03 DIAGNOSIS — J449 Chronic obstructive pulmonary disease, unspecified: Secondary | ICD-10-CM | POA: Diagnosis not present

## 2019-05-03 DIAGNOSIS — Z792 Long term (current) use of antibiotics: Secondary | ICD-10-CM | POA: Diagnosis not present

## 2019-05-03 DIAGNOSIS — Z7982 Long term (current) use of aspirin: Secondary | ICD-10-CM | POA: Diagnosis not present

## 2019-05-03 DIAGNOSIS — M1990 Unspecified osteoarthritis, unspecified site: Secondary | ICD-10-CM | POA: Diagnosis not present

## 2019-05-03 DIAGNOSIS — I69398 Other sequelae of cerebral infarction: Secondary | ICD-10-CM | POA: Diagnosis not present

## 2019-05-03 DIAGNOSIS — E785 Hyperlipidemia, unspecified: Secondary | ICD-10-CM | POA: Diagnosis not present

## 2019-05-03 DIAGNOSIS — Z79891 Long term (current) use of opiate analgesic: Secondary | ICD-10-CM | POA: Diagnosis not present

## 2019-05-03 DIAGNOSIS — M21371 Foot drop, right foot: Secondary | ICD-10-CM | POA: Diagnosis not present

## 2019-05-07 DIAGNOSIS — M810 Age-related osteoporosis without current pathological fracture: Secondary | ICD-10-CM | POA: Diagnosis not present

## 2019-05-07 DIAGNOSIS — Z9181 History of falling: Secondary | ICD-10-CM | POA: Diagnosis not present

## 2019-05-07 DIAGNOSIS — J449 Chronic obstructive pulmonary disease, unspecified: Secondary | ICD-10-CM | POA: Diagnosis not present

## 2019-05-07 DIAGNOSIS — I69398 Other sequelae of cerebral infarction: Secondary | ICD-10-CM | POA: Diagnosis not present

## 2019-05-07 DIAGNOSIS — Z792 Long term (current) use of antibiotics: Secondary | ICD-10-CM | POA: Diagnosis not present

## 2019-05-07 DIAGNOSIS — Z79891 Long term (current) use of opiate analgesic: Secondary | ICD-10-CM | POA: Diagnosis not present

## 2019-05-07 DIAGNOSIS — E039 Hypothyroidism, unspecified: Secondary | ICD-10-CM | POA: Diagnosis not present

## 2019-05-07 DIAGNOSIS — E785 Hyperlipidemia, unspecified: Secondary | ICD-10-CM | POA: Diagnosis not present

## 2019-05-07 DIAGNOSIS — M21371 Foot drop, right foot: Secondary | ICD-10-CM | POA: Diagnosis not present

## 2019-05-07 DIAGNOSIS — Z7982 Long term (current) use of aspirin: Secondary | ICD-10-CM | POA: Diagnosis not present

## 2019-05-07 DIAGNOSIS — M1990 Unspecified osteoarthritis, unspecified site: Secondary | ICD-10-CM | POA: Diagnosis not present

## 2019-05-09 DIAGNOSIS — M1990 Unspecified osteoarthritis, unspecified site: Secondary | ICD-10-CM | POA: Diagnosis not present

## 2019-05-09 DIAGNOSIS — I69398 Other sequelae of cerebral infarction: Secondary | ICD-10-CM | POA: Diagnosis not present

## 2019-05-09 DIAGNOSIS — Z792 Long term (current) use of antibiotics: Secondary | ICD-10-CM | POA: Diagnosis not present

## 2019-05-09 DIAGNOSIS — Z9181 History of falling: Secondary | ICD-10-CM | POA: Diagnosis not present

## 2019-05-09 DIAGNOSIS — M810 Age-related osteoporosis without current pathological fracture: Secondary | ICD-10-CM | POA: Diagnosis not present

## 2019-05-09 DIAGNOSIS — M21371 Foot drop, right foot: Secondary | ICD-10-CM | POA: Diagnosis not present

## 2019-05-09 DIAGNOSIS — E785 Hyperlipidemia, unspecified: Secondary | ICD-10-CM | POA: Diagnosis not present

## 2019-05-09 DIAGNOSIS — Z7982 Long term (current) use of aspirin: Secondary | ICD-10-CM | POA: Diagnosis not present

## 2019-05-09 DIAGNOSIS — J449 Chronic obstructive pulmonary disease, unspecified: Secondary | ICD-10-CM | POA: Diagnosis not present

## 2019-05-09 DIAGNOSIS — Z79891 Long term (current) use of opiate analgesic: Secondary | ICD-10-CM | POA: Diagnosis not present

## 2019-05-09 DIAGNOSIS — E039 Hypothyroidism, unspecified: Secondary | ICD-10-CM | POA: Diagnosis not present

## 2019-05-15 DIAGNOSIS — Z9181 History of falling: Secondary | ICD-10-CM | POA: Diagnosis not present

## 2019-05-15 DIAGNOSIS — M1990 Unspecified osteoarthritis, unspecified site: Secondary | ICD-10-CM | POA: Diagnosis not present

## 2019-05-15 DIAGNOSIS — M21371 Foot drop, right foot: Secondary | ICD-10-CM | POA: Diagnosis not present

## 2019-05-15 DIAGNOSIS — Z79891 Long term (current) use of opiate analgesic: Secondary | ICD-10-CM | POA: Diagnosis not present

## 2019-05-15 DIAGNOSIS — M810 Age-related osteoporosis without current pathological fracture: Secondary | ICD-10-CM | POA: Diagnosis not present

## 2019-05-15 DIAGNOSIS — E785 Hyperlipidemia, unspecified: Secondary | ICD-10-CM | POA: Diagnosis not present

## 2019-05-15 DIAGNOSIS — I69398 Other sequelae of cerebral infarction: Secondary | ICD-10-CM | POA: Diagnosis not present

## 2019-05-15 DIAGNOSIS — J449 Chronic obstructive pulmonary disease, unspecified: Secondary | ICD-10-CM | POA: Diagnosis not present

## 2019-05-15 DIAGNOSIS — E039 Hypothyroidism, unspecified: Secondary | ICD-10-CM | POA: Diagnosis not present

## 2019-05-15 DIAGNOSIS — Z792 Long term (current) use of antibiotics: Secondary | ICD-10-CM | POA: Diagnosis not present

## 2019-05-15 DIAGNOSIS — Z7982 Long term (current) use of aspirin: Secondary | ICD-10-CM | POA: Diagnosis not present

## 2019-05-17 DIAGNOSIS — E039 Hypothyroidism, unspecified: Secondary | ICD-10-CM | POA: Diagnosis not present

## 2019-05-17 DIAGNOSIS — J449 Chronic obstructive pulmonary disease, unspecified: Secondary | ICD-10-CM | POA: Diagnosis not present

## 2019-05-17 DIAGNOSIS — Z792 Long term (current) use of antibiotics: Secondary | ICD-10-CM | POA: Diagnosis not present

## 2019-05-17 DIAGNOSIS — Z9181 History of falling: Secondary | ICD-10-CM | POA: Diagnosis not present

## 2019-05-17 DIAGNOSIS — Z7982 Long term (current) use of aspirin: Secondary | ICD-10-CM | POA: Diagnosis not present

## 2019-05-17 DIAGNOSIS — Z79891 Long term (current) use of opiate analgesic: Secondary | ICD-10-CM | POA: Diagnosis not present

## 2019-05-17 DIAGNOSIS — I69398 Other sequelae of cerebral infarction: Secondary | ICD-10-CM | POA: Diagnosis not present

## 2019-05-17 DIAGNOSIS — M21371 Foot drop, right foot: Secondary | ICD-10-CM | POA: Diagnosis not present

## 2019-05-17 DIAGNOSIS — M1990 Unspecified osteoarthritis, unspecified site: Secondary | ICD-10-CM | POA: Diagnosis not present

## 2019-05-17 DIAGNOSIS — E785 Hyperlipidemia, unspecified: Secondary | ICD-10-CM | POA: Diagnosis not present

## 2019-05-17 DIAGNOSIS — M810 Age-related osteoporosis without current pathological fracture: Secondary | ICD-10-CM | POA: Diagnosis not present

## 2019-05-18 DIAGNOSIS — M810 Age-related osteoporosis without current pathological fracture: Secondary | ICD-10-CM | POA: Diagnosis not present

## 2019-05-18 DIAGNOSIS — E7849 Other hyperlipidemia: Secondary | ICD-10-CM | POA: Diagnosis not present

## 2019-05-18 DIAGNOSIS — M1991 Primary osteoarthritis, unspecified site: Secondary | ICD-10-CM | POA: Diagnosis not present

## 2019-05-18 DIAGNOSIS — J449 Chronic obstructive pulmonary disease, unspecified: Secondary | ICD-10-CM | POA: Diagnosis not present

## 2019-05-22 DIAGNOSIS — M1990 Unspecified osteoarthritis, unspecified site: Secondary | ICD-10-CM | POA: Diagnosis not present

## 2019-05-22 DIAGNOSIS — Z7982 Long term (current) use of aspirin: Secondary | ICD-10-CM | POA: Diagnosis not present

## 2019-05-22 DIAGNOSIS — M21371 Foot drop, right foot: Secondary | ICD-10-CM | POA: Diagnosis not present

## 2019-05-22 DIAGNOSIS — Z9181 History of falling: Secondary | ICD-10-CM | POA: Diagnosis not present

## 2019-05-22 DIAGNOSIS — E039 Hypothyroidism, unspecified: Secondary | ICD-10-CM | POA: Diagnosis not present

## 2019-05-22 DIAGNOSIS — M810 Age-related osteoporosis without current pathological fracture: Secondary | ICD-10-CM | POA: Diagnosis not present

## 2019-05-22 DIAGNOSIS — E785 Hyperlipidemia, unspecified: Secondary | ICD-10-CM | POA: Diagnosis not present

## 2019-05-22 DIAGNOSIS — J449 Chronic obstructive pulmonary disease, unspecified: Secondary | ICD-10-CM | POA: Diagnosis not present

## 2019-05-22 DIAGNOSIS — Z792 Long term (current) use of antibiotics: Secondary | ICD-10-CM | POA: Diagnosis not present

## 2019-05-22 DIAGNOSIS — I69398 Other sequelae of cerebral infarction: Secondary | ICD-10-CM | POA: Diagnosis not present

## 2019-05-22 DIAGNOSIS — Z79891 Long term (current) use of opiate analgesic: Secondary | ICD-10-CM | POA: Diagnosis not present

## 2019-05-24 DIAGNOSIS — J449 Chronic obstructive pulmonary disease, unspecified: Secondary | ICD-10-CM | POA: Diagnosis not present

## 2019-05-24 DIAGNOSIS — M1990 Unspecified osteoarthritis, unspecified site: Secondary | ICD-10-CM | POA: Diagnosis not present

## 2019-05-24 DIAGNOSIS — E039 Hypothyroidism, unspecified: Secondary | ICD-10-CM | POA: Diagnosis not present

## 2019-05-24 DIAGNOSIS — Z9181 History of falling: Secondary | ICD-10-CM | POA: Diagnosis not present

## 2019-05-24 DIAGNOSIS — I69398 Other sequelae of cerebral infarction: Secondary | ICD-10-CM | POA: Diagnosis not present

## 2019-05-24 DIAGNOSIS — Z792 Long term (current) use of antibiotics: Secondary | ICD-10-CM | POA: Diagnosis not present

## 2019-05-24 DIAGNOSIS — E785 Hyperlipidemia, unspecified: Secondary | ICD-10-CM | POA: Diagnosis not present

## 2019-05-24 DIAGNOSIS — M810 Age-related osteoporosis without current pathological fracture: Secondary | ICD-10-CM | POA: Diagnosis not present

## 2019-05-24 DIAGNOSIS — Z79891 Long term (current) use of opiate analgesic: Secondary | ICD-10-CM | POA: Diagnosis not present

## 2019-05-24 DIAGNOSIS — Z7982 Long term (current) use of aspirin: Secondary | ICD-10-CM | POA: Diagnosis not present

## 2019-05-24 DIAGNOSIS — M21371 Foot drop, right foot: Secondary | ICD-10-CM | POA: Diagnosis not present

## 2019-05-27 DIAGNOSIS — Z79891 Long term (current) use of opiate analgesic: Secondary | ICD-10-CM | POA: Diagnosis not present

## 2019-05-27 DIAGNOSIS — E785 Hyperlipidemia, unspecified: Secondary | ICD-10-CM | POA: Diagnosis not present

## 2019-05-27 DIAGNOSIS — J449 Chronic obstructive pulmonary disease, unspecified: Secondary | ICD-10-CM | POA: Diagnosis not present

## 2019-05-27 DIAGNOSIS — M21371 Foot drop, right foot: Secondary | ICD-10-CM | POA: Diagnosis not present

## 2019-05-27 DIAGNOSIS — M1990 Unspecified osteoarthritis, unspecified site: Secondary | ICD-10-CM | POA: Diagnosis not present

## 2019-05-27 DIAGNOSIS — Z9181 History of falling: Secondary | ICD-10-CM | POA: Diagnosis not present

## 2019-05-27 DIAGNOSIS — M810 Age-related osteoporosis without current pathological fracture: Secondary | ICD-10-CM | POA: Diagnosis not present

## 2019-05-27 DIAGNOSIS — Z7982 Long term (current) use of aspirin: Secondary | ICD-10-CM | POA: Diagnosis not present

## 2019-05-27 DIAGNOSIS — E039 Hypothyroidism, unspecified: Secondary | ICD-10-CM | POA: Diagnosis not present

## 2019-05-27 DIAGNOSIS — Z792 Long term (current) use of antibiotics: Secondary | ICD-10-CM | POA: Diagnosis not present

## 2019-05-27 DIAGNOSIS — I69398 Other sequelae of cerebral infarction: Secondary | ICD-10-CM | POA: Diagnosis not present

## 2019-05-29 DIAGNOSIS — M810 Age-related osteoporosis without current pathological fracture: Secondary | ICD-10-CM | POA: Diagnosis not present

## 2019-05-29 DIAGNOSIS — M1990 Unspecified osteoarthritis, unspecified site: Secondary | ICD-10-CM | POA: Diagnosis not present

## 2019-05-29 DIAGNOSIS — M21371 Foot drop, right foot: Secondary | ICD-10-CM | POA: Diagnosis not present

## 2019-05-29 DIAGNOSIS — I69398 Other sequelae of cerebral infarction: Secondary | ICD-10-CM | POA: Diagnosis not present

## 2019-05-29 DIAGNOSIS — Z792 Long term (current) use of antibiotics: Secondary | ICD-10-CM | POA: Diagnosis not present

## 2019-05-29 DIAGNOSIS — E785 Hyperlipidemia, unspecified: Secondary | ICD-10-CM | POA: Diagnosis not present

## 2019-05-29 DIAGNOSIS — Z9181 History of falling: Secondary | ICD-10-CM | POA: Diagnosis not present

## 2019-05-29 DIAGNOSIS — Z7982 Long term (current) use of aspirin: Secondary | ICD-10-CM | POA: Diagnosis not present

## 2019-05-29 DIAGNOSIS — Z79891 Long term (current) use of opiate analgesic: Secondary | ICD-10-CM | POA: Diagnosis not present

## 2019-05-29 DIAGNOSIS — E039 Hypothyroidism, unspecified: Secondary | ICD-10-CM | POA: Diagnosis not present

## 2019-05-29 DIAGNOSIS — J449 Chronic obstructive pulmonary disease, unspecified: Secondary | ICD-10-CM | POA: Diagnosis not present

## 2019-06-04 DIAGNOSIS — Z9181 History of falling: Secondary | ICD-10-CM | POA: Diagnosis not present

## 2019-06-04 DIAGNOSIS — E039 Hypothyroidism, unspecified: Secondary | ICD-10-CM | POA: Diagnosis not present

## 2019-06-04 DIAGNOSIS — Z7982 Long term (current) use of aspirin: Secondary | ICD-10-CM | POA: Diagnosis not present

## 2019-06-04 DIAGNOSIS — M1990 Unspecified osteoarthritis, unspecified site: Secondary | ICD-10-CM | POA: Diagnosis not present

## 2019-06-04 DIAGNOSIS — M810 Age-related osteoporosis without current pathological fracture: Secondary | ICD-10-CM | POA: Diagnosis not present

## 2019-06-04 DIAGNOSIS — Z792 Long term (current) use of antibiotics: Secondary | ICD-10-CM | POA: Diagnosis not present

## 2019-06-04 DIAGNOSIS — J449 Chronic obstructive pulmonary disease, unspecified: Secondary | ICD-10-CM | POA: Diagnosis not present

## 2019-06-04 DIAGNOSIS — E785 Hyperlipidemia, unspecified: Secondary | ICD-10-CM | POA: Diagnosis not present

## 2019-06-04 DIAGNOSIS — M21371 Foot drop, right foot: Secondary | ICD-10-CM | POA: Diagnosis not present

## 2019-06-04 DIAGNOSIS — Z79891 Long term (current) use of opiate analgesic: Secondary | ICD-10-CM | POA: Diagnosis not present

## 2019-06-04 DIAGNOSIS — I69398 Other sequelae of cerebral infarction: Secondary | ICD-10-CM | POA: Diagnosis not present

## 2019-06-11 DIAGNOSIS — Z792 Long term (current) use of antibiotics: Secondary | ICD-10-CM | POA: Diagnosis not present

## 2019-06-11 DIAGNOSIS — M810 Age-related osteoporosis without current pathological fracture: Secondary | ICD-10-CM | POA: Diagnosis not present

## 2019-06-11 DIAGNOSIS — Z7982 Long term (current) use of aspirin: Secondary | ICD-10-CM | POA: Diagnosis not present

## 2019-06-11 DIAGNOSIS — M1990 Unspecified osteoarthritis, unspecified site: Secondary | ICD-10-CM | POA: Diagnosis not present

## 2019-06-11 DIAGNOSIS — E785 Hyperlipidemia, unspecified: Secondary | ICD-10-CM | POA: Diagnosis not present

## 2019-06-11 DIAGNOSIS — E039 Hypothyroidism, unspecified: Secondary | ICD-10-CM | POA: Diagnosis not present

## 2019-06-11 DIAGNOSIS — Z79891 Long term (current) use of opiate analgesic: Secondary | ICD-10-CM | POA: Diagnosis not present

## 2019-06-11 DIAGNOSIS — M21371 Foot drop, right foot: Secondary | ICD-10-CM | POA: Diagnosis not present

## 2019-06-11 DIAGNOSIS — Z9181 History of falling: Secondary | ICD-10-CM | POA: Diagnosis not present

## 2019-06-11 DIAGNOSIS — I69398 Other sequelae of cerebral infarction: Secondary | ICD-10-CM | POA: Diagnosis not present

## 2019-06-11 DIAGNOSIS — J449 Chronic obstructive pulmonary disease, unspecified: Secondary | ICD-10-CM | POA: Diagnosis not present

## 2019-06-17 DIAGNOSIS — M21371 Foot drop, right foot: Secondary | ICD-10-CM | POA: Diagnosis not present

## 2019-06-17 DIAGNOSIS — Z7982 Long term (current) use of aspirin: Secondary | ICD-10-CM | POA: Diagnosis not present

## 2019-06-17 DIAGNOSIS — J449 Chronic obstructive pulmonary disease, unspecified: Secondary | ICD-10-CM | POA: Diagnosis not present

## 2019-06-17 DIAGNOSIS — E785 Hyperlipidemia, unspecified: Secondary | ICD-10-CM | POA: Diagnosis not present

## 2019-06-17 DIAGNOSIS — I69398 Other sequelae of cerebral infarction: Secondary | ICD-10-CM | POA: Diagnosis not present

## 2019-06-17 DIAGNOSIS — M1991 Primary osteoarthritis, unspecified site: Secondary | ICD-10-CM | POA: Diagnosis not present

## 2019-06-17 DIAGNOSIS — Z792 Long term (current) use of antibiotics: Secondary | ICD-10-CM | POA: Diagnosis not present

## 2019-06-17 DIAGNOSIS — M810 Age-related osteoporosis without current pathological fracture: Secondary | ICD-10-CM | POA: Diagnosis not present

## 2019-06-17 DIAGNOSIS — Z79891 Long term (current) use of opiate analgesic: Secondary | ICD-10-CM | POA: Diagnosis not present

## 2019-06-17 DIAGNOSIS — E039 Hypothyroidism, unspecified: Secondary | ICD-10-CM | POA: Diagnosis not present

## 2019-06-17 DIAGNOSIS — Z9181 History of falling: Secondary | ICD-10-CM | POA: Diagnosis not present

## 2019-06-17 DIAGNOSIS — M1990 Unspecified osteoarthritis, unspecified site: Secondary | ICD-10-CM | POA: Diagnosis not present

## 2019-06-24 DIAGNOSIS — M810 Age-related osteoporosis without current pathological fracture: Secondary | ICD-10-CM | POA: Diagnosis not present

## 2019-06-24 DIAGNOSIS — I69398 Other sequelae of cerebral infarction: Secondary | ICD-10-CM | POA: Diagnosis not present

## 2019-06-24 DIAGNOSIS — J449 Chronic obstructive pulmonary disease, unspecified: Secondary | ICD-10-CM | POA: Diagnosis not present

## 2019-06-24 DIAGNOSIS — Z7982 Long term (current) use of aspirin: Secondary | ICD-10-CM | POA: Diagnosis not present

## 2019-06-24 DIAGNOSIS — Z9181 History of falling: Secondary | ICD-10-CM | POA: Diagnosis not present

## 2019-06-24 DIAGNOSIS — E785 Hyperlipidemia, unspecified: Secondary | ICD-10-CM | POA: Diagnosis not present

## 2019-06-24 DIAGNOSIS — Z792 Long term (current) use of antibiotics: Secondary | ICD-10-CM | POA: Diagnosis not present

## 2019-06-24 DIAGNOSIS — Z79891 Long term (current) use of opiate analgesic: Secondary | ICD-10-CM | POA: Diagnosis not present

## 2019-06-24 DIAGNOSIS — M1990 Unspecified osteoarthritis, unspecified site: Secondary | ICD-10-CM | POA: Diagnosis not present

## 2019-06-24 DIAGNOSIS — M21371 Foot drop, right foot: Secondary | ICD-10-CM | POA: Diagnosis not present

## 2019-06-24 DIAGNOSIS — E039 Hypothyroidism, unspecified: Secondary | ICD-10-CM | POA: Diagnosis not present

## 2019-07-02 DIAGNOSIS — J449 Chronic obstructive pulmonary disease, unspecified: Secondary | ICD-10-CM | POA: Diagnosis not present

## 2019-07-02 DIAGNOSIS — M21371 Foot drop, right foot: Secondary | ICD-10-CM | POA: Diagnosis not present

## 2019-07-02 DIAGNOSIS — E785 Hyperlipidemia, unspecified: Secondary | ICD-10-CM | POA: Diagnosis not present

## 2019-07-02 DIAGNOSIS — Z9181 History of falling: Secondary | ICD-10-CM | POA: Diagnosis not present

## 2019-07-02 DIAGNOSIS — E039 Hypothyroidism, unspecified: Secondary | ICD-10-CM | POA: Diagnosis not present

## 2019-07-02 DIAGNOSIS — Z7982 Long term (current) use of aspirin: Secondary | ICD-10-CM | POA: Diagnosis not present

## 2019-07-02 DIAGNOSIS — M810 Age-related osteoporosis without current pathological fracture: Secondary | ICD-10-CM | POA: Diagnosis not present

## 2019-07-02 DIAGNOSIS — M1990 Unspecified osteoarthritis, unspecified site: Secondary | ICD-10-CM | POA: Diagnosis not present

## 2019-07-02 DIAGNOSIS — Z79891 Long term (current) use of opiate analgesic: Secondary | ICD-10-CM | POA: Diagnosis not present

## 2019-07-02 DIAGNOSIS — Z792 Long term (current) use of antibiotics: Secondary | ICD-10-CM | POA: Diagnosis not present

## 2019-07-02 DIAGNOSIS — I69398 Other sequelae of cerebral infarction: Secondary | ICD-10-CM | POA: Diagnosis not present

## 2019-07-05 DIAGNOSIS — R5381 Other malaise: Secondary | ICD-10-CM | POA: Diagnosis not present

## 2019-07-05 DIAGNOSIS — W19XXXA Unspecified fall, initial encounter: Secondary | ICD-10-CM | POA: Diagnosis not present

## 2019-07-09 ENCOUNTER — Other Ambulatory Visit: Payer: Self-pay

## 2019-07-09 NOTE — Patient Outreach (Signed)
[facility]) Care Management  07/09/2019  Amanda Armstrong 1946/03/07 [id number]   Medication Adherence call to Amanda Armstrong Compliant Voice message left with a call back number. Amanda Armstrong is showing past due on Atorvastatin 20 mg under [place].   [place] Management Direct Dial [phone]  Fax [phone] [email or web address]

## 2019-07-15 ENCOUNTER — Other Ambulatory Visit: Payer: Self-pay

## 2019-07-15 ENCOUNTER — Emergency Department (HOSPITAL_COMMUNITY)
Admission: EM | Admit: 2019-07-15 | Discharge: 2019-07-15 | Disposition: A | Discharge status: Home / Self Care | Payer: Medicare Other | Attending: Emergency Medicine | Admitting: Emergency Medicine

## 2019-07-15 ENCOUNTER — Encounter (HOSPITAL_COMMUNITY): Payer: Self-pay

## 2019-07-15 ENCOUNTER — Emergency Department (HOSPITAL_COMMUNITY): Payer: Medicare Other

## 2019-07-15 DIAGNOSIS — R0902 Hypoxemia: Secondary | ICD-10-CM | POA: Diagnosis not present

## 2019-07-15 DIAGNOSIS — I959 Hypotension, unspecified: Secondary | ICD-10-CM | POA: Diagnosis not present

## 2019-07-15 DIAGNOSIS — F17210 Nicotine dependence, cigarettes, uncomplicated: Secondary | ICD-10-CM | POA: Insufficient documentation

## 2019-07-15 DIAGNOSIS — W19XXXA Unspecified fall, initial encounter: Secondary | ICD-10-CM

## 2019-07-15 DIAGNOSIS — Y999 Unspecified external cause status: Secondary | ICD-10-CM | POA: Diagnosis not present

## 2019-07-15 DIAGNOSIS — E039 Hypothyroidism, unspecified: Secondary | ICD-10-CM | POA: Diagnosis not present

## 2019-07-15 DIAGNOSIS — Y9301 Activity, walking, marching and hiking: Secondary | ICD-10-CM | POA: Insufficient documentation

## 2019-07-15 DIAGNOSIS — Z79899 Other long term (current) drug therapy: Secondary | ICD-10-CM | POA: Insufficient documentation

## 2019-07-15 DIAGNOSIS — M25551 Pain in right hip: Secondary | ICD-10-CM | POA: Diagnosis not present

## 2019-07-15 DIAGNOSIS — Y929 Unspecified place or not applicable: Secondary | ICD-10-CM | POA: Diagnosis not present

## 2019-07-15 DIAGNOSIS — I10 Essential (primary) hypertension: Secondary | ICD-10-CM | POA: Insufficient documentation

## 2019-07-15 DIAGNOSIS — W010XXA Fall on same level from slipping, tripping and stumbling without subsequent striking against object, initial encounter: Secondary | ICD-10-CM | POA: Diagnosis not present

## 2019-07-15 DIAGNOSIS — R52 Pain, unspecified: Secondary | ICD-10-CM | POA: Diagnosis not present

## 2019-07-15 DIAGNOSIS — Z96651 Presence of right artificial knee joint: Secondary | ICD-10-CM | POA: Diagnosis not present

## 2019-07-15 DIAGNOSIS — S79911A Unspecified injury of right hip, initial encounter: Secondary | ICD-10-CM | POA: Diagnosis not present

## 2019-07-15 MED ORDER — ACETAMINOPHEN 325 MG PO TABS: 650.0000 mg | ORAL_TABLET | Freq: Four times a day (QID) | ORAL | 0 refills | Status: DC | PRN

## 2019-07-15 NOTE — Discharge Instructions (Signed)
You were seen in our emergency department for hip pain after a fall 2 days ago.  Your x-ray here did not show any sign of a fracture.  I explained it is possible that you have a strain or an injury to the muscles in your hip.  I advised that you take Tylenol and continue to use your walker at all times at home.  Be extra careful getting up and moving around.  If you continue having pain after 1 week, you should call your primary care doctor.  You may need an MRI of your hip done as an outpatient.  It is possible that you have a very small fracture in your hip or a muscle tear that we cannot see on our x-rays.  Your doctor can help you arrange for an MRI if he believes that is the next best step for you.

## 2019-07-15 NOTE — ED Triage Notes (Signed)
Pt reports slipped on a wet floor 2 days ago and fell.  C/O pain in r hip.    Reports is ambulatory with a walker.

## 2019-07-15 NOTE — ED Notes (Signed)
Patient daughter, [phone], Amanda Armstrong.

## 2019-07-15 NOTE — ED Provider Notes (Signed)
[facility] EMERGENCY DEPARTMENT Provider Note   CSN: [id number] Arrival date & time: 07/15/19  0920     History Chief Complaint  Patient presents with  . Fall    Amanda Armstrong is a 73 y.o. female history of degenerative disc disease, chronic pain, presenting to the ED with mechanical fall and injury to her right hip.  She reports she normally walks with the assistance of a walker.  She slipped on wet floor about 2 days ago and fell onto her right side landing on her right hip.  She is able to ambulate afterwards and has been able to bear weight.  She does report persistent pain located directly in her right hip, which does not radiate down her legs or to her toes.  There is no numbness or weakness.  Said the pain is worse with ambulation and better when sitting down or at rest.  She has no history of hip fracture.  HPI     Past Medical History:  Diagnosis Date  . Anxiety   . Arthritis    Bilateral Knee DJD  . Chronic pain   . Chronic pain   . Chronically on opiate therapy   . Clostridium difficile carrier    pt stated that she has intermittent sx  . Clostridium difficile infection   . Daytime somnolence   . Frequent falls   . Headache(784.0)    SINUS HEADACHE OCCASSIONALLY  . Hypertension   . Hypothyroidism   . Multifactorial gait disorder   . Osteoporosis   . Right foot drop   . Snoring   . Stroke ([place])   . Tobacco use   . Vertigo   . Weakness     Patient Active Problem List   Diagnosis Date Noted  . UTI (urinary tract infection) 01/29/2016  . Nicotine dependence 09/07/2015  . Enteritis due to Clostridium difficile 08/29/2013  . Weakness 08/02/2013  . Diarrhea 08/02/2013  . Acute pyelonephritis 05/28/2013  . UTI (lower urinary tract infection) 05/27/2013  . Tachypnea 05/27/2013  . Hyperglycemia 05/27/2013  . Arthritis   . Anxiety   . Hypothyroidism     Past Surgical History:  Procedure Laterality Date  . CATARACT EXTRACTION W/PHACO Right  06/28/2016   Procedure: CATARACT EXTRACTION PHACO AND INTRAOCULAR LENS PLACEMENT (IOC);  Surgeon: Rutherford Guys, MD;  Location: [facility];  Service: Ophthalmology;  Laterality: Right;  CDE: 11.10  . CATARACT EXTRACTION W/PHACO Left 07/26/2016   Procedure: CATARACT EXTRACTION PHACO AND INTRAOCULAR LENS PLACEMENT (IOC);  Surgeon: Rutherford Guys, MD;  Location: [facility];  Service: Ophthalmology;  Laterality: Left;  CDE: 8.88  . JOINT REPLACEMENT  11/08/3010   right total knee  . TOTAL KNEE ARTHROPLASTY  11/07/2011   Procedure: TOTAL KNEE ARTHROPLASTY;  Surgeon: Lorn Junes, MD;  Location: [place] [facility];  Service: Orthopedics;  Laterality: Left;  DR [place] THIS CASE  . TOTAL KNEE ARTHROPLASTY Right 2011  . TUBAL LIGATION  1982     OB History    Gravida  4   Para  4   Term  4   Preterm      AB      Living        SAB      TAB      Ectopic      Multiple      Live Births              Family History  Problem Relation Age of Onset  .  Heart attack Mother   . COPD Father   . Arthritis Sister   . Heart disease Brother     Social History   Tobacco Use  . Smoking status: Current Every Day Smoker    Packs/day: 0.50    Years: 48.00    Pack years: 24.00    Types: Cigarettes    Last attempt to quit: 06/15/2012    Years since quitting: 7.0  . Smokeless tobacco: Never Used  . Tobacco comment: 12/29/14 1/2 PPD  Substance Use Topics  . Alcohol use: No    Alcohol/week: 0.0 standard drinks  . Drug use: No    Home Medications Prior to Admission medications   Medication Sig Start Date End Date Taking? Authorizing Provider  acetaminophen (TYLENOL) 325 MG tablet Take 2 tablets (650 mg total) by mouth every 6 (six) hours as needed for up to 30 doses for mild pain or moderate pain. 07/15/19   Terald Sleeperrifan, Madeline Bebout J, MD  alendronate (FOSAMAX) 70 MG tablet Take 70 mg by mouth every Sunday.  04/05/13   [provider]  ALPRAZolam Prudy Feeler(XANAX) 1 MG tablet Take 1 tablet (1 mg  total) by mouth 3 (three) times daily. Patient taking differently: Take 1 mg by mouth at bedtime.  02/01/16   Erick BlinksMemon, Jehanzeb, MD  atorvastatin (LIPITOR) 20 MG tablet Take 20 mg by mouth at bedtime. 09/14/15   [provider]  cephALEXin (KEFLEX) 500 MG capsule Take 1 capsule (500 mg total) by mouth 4 (four) times daily. Patient not taking: Reported on 12/17/2018 11/26/18   Bethann BerkshireZammit, Joseph, MD  famotidine (PEPCID) 20 MG tablet Take 1 tablet (20 mg total) by mouth 2 (two) times daily. 01/11/19   Dartha LodgeFord, Kelsey N, PA-C  furosemide (LASIX) 20 MG tablet Take 20 mg by mouth 2 (two) times daily.     [provider]  ibuprofen (ADVIL) 400 MG tablet Take 1 tablet (400 mg total) by mouth 2 (two) times a day. 01/11/19   Dartha LodgeFord, Kelsey N, PA-C  levothyroxine (SYNTHROID) 100 MCG tablet Take 75 mcg by mouth daily before breakfast.  08/29/15   [provider]  lisinopril (ZESTRIL) 20 MG tablet Take 20 mg by mouth 2 (two) times a day.    [provider]  meclizine (ANTIVERT) 25 MG tablet Take 1 tablet (25 mg total) by mouth 3 (three) times daily as needed for dizziness. 04/13/16   Lavera GuiseLiu, Dana Duo, MD  methocarbamol (ROBAXIN) 500 MG tablet Take 1 tablet (500 mg total) by mouth 2 (two) times daily. 01/11/19   Dartha LodgeFord, Kelsey N, PA-C  sulfamethoxazole-trimethoprim (BACTRIM) 400-80 MG tablet Take 1 tablet by mouth at bedtime.    [provider]  terbinafine (LAMISIL) 250 MG tablet Take 250 mg by mouth every evening.    [provider]  traZODone (DESYREL) 50 MG tablet Take 50 mg by mouth at bedtime.    [provider]  umeclidinium-vilanterol (ANORO ELLIPTA) 62.5-25 MCG/INH AEPB Inhale 1 puff into the lungs daily.    [provider]    Allergies    Iron, Oxycodone, and Sudafed [pseudoephedrine hcl]  Review of Systems   Review of Systems  Constitutional: Negative for chills and fever.  Respiratory: Negative for cough and shortness of breath.    Gastrointestinal: Negative for abdominal pain and vomiting.  Musculoskeletal: Positive for arthralgias, gait problem and myalgias. Negative for back pain.  Skin: Negative for color change and rash.  Neurological: Negative for syncope and weakness.  All other systems reviewed and are  negative.   Physical Exam Updated Vital Signs BP (!) 115/59   Pulse 78   Temp (!) 97 F (36.1 C) (Temporal)   Resp 20   Ht 4\' 10"  (1.473 m)   Wt 63.5 kg   SpO2 97%   BMI 29.26 kg/m   Physical Exam Vitals and nursing note reviewed.  Constitutional:      General: She is not in acute distress.    Appearance: She is well-developed.  HENT:     Head: Normocephalic and atraumatic.  Eyes:     Conjunctiva/sclera: Conjunctivae normal.  Cardiovascular:     Rate and Rhythm: Normal rate and regular rhythm.     Pulses: Normal pulses.  Pulmonary:     Effort: Pulmonary effort is normal. No respiratory distress.  Abdominal:     Palpations: Abdomen is soft.     Tenderness: There is no abdominal tenderness.  Musculoskeletal:     Cervical back: Neck supple.     Comments: Full ROM at the right and left hip Negative straight leg test Patient able to bear weight No spinal midline tenderness  Skin:    General: Skin is warm and dry.  Neurological:     General: No focal deficit present.     Mental Status: She is alert.     Sensory: No sensory deficit.     ED Results / Procedures / Treatments   Labs (all labs ordered are listed, but only abnormal results are displayed) Labs Reviewed - No data to display  EKG None  Radiology DG Hip Unilat W or Wo Pelvis 2-3 Views Right  Result Date: 07/15/2019 CLINICAL DATA:  Fall EXAM: DG HIP (WITH OR WITHOUT PELVIS) 2-3V RIGHT COMPARISON:  None. FINDINGS: Alignment is anatomic. There is no acute fracture. Joint space is preserved. IMPRESSION: No acute fracture. Electronically Signed   By: 07/17/2019 M.D.   On: 07/15/2019 10:40    Procedures Procedures  (including critical care time)  Medications Ordered in ED Medications - No data to display  ED Course  I have reviewed the triage vital signs and the nursing notes.  Pertinent labs & imaging results that were available during my care of the patient were reviewed by me and considered in my medical decision making (see chart for details).  73 yo female presenting w/ mechanical fall 2 days ago onto right side, here w/ complaint of right hip pain.  She is neurovascularly intact and able to bear weight.  She uses a walker at home.  Xray does not show acute fracture.  I advised bedrest today and tylenol for pain, and using support when getting up (see discharge instructions).  She can f/u with PCP if her symptoms persist.    Final Clinical Impression(s) / ED Diagnoses Final diagnoses:  Fall, initial encounter  Right hip pain    Rx / DC Orders ED Discharge Orders         Ordered    acetaminophen (TYLENOL) 325 MG tablet  Every 6 hours PRN     07/15/19 1200           07/17/19, MD 07/15/19 1844

## 2019-07-16 ENCOUNTER — Other Ambulatory Visit: Payer: Self-pay

## 2019-07-16 NOTE — Patient Outreach (Signed)
[facility]) Care Management  07/16/2019  Amanda Armstrong Dec 05, 1945 [id number]   Medication Adherence call to Amanda Armstrong Hippa Identifiers Verify spoke with patient she is past due on Atorvastatin 20 mg,patient explain she takes 1 tablet daily,she explain she did not have any at this time and if we can call her doctor for more refill,[facility] had one more refill and will fill for patient to pick up.patient will be notify when prescription is fill and ready to be pick up. Amanda Armstrong is showing past due under [place].   [place] Management Direct Dial [phone]  Fax [phone] [email or web address]

## 2019-07-18 DIAGNOSIS — J449 Chronic obstructive pulmonary disease, unspecified: Secondary | ICD-10-CM | POA: Diagnosis not present

## 2019-07-18 DIAGNOSIS — M1991 Primary osteoarthritis, unspecified site: Secondary | ICD-10-CM | POA: Diagnosis not present

## 2019-07-18 DIAGNOSIS — M810 Age-related osteoporosis without current pathological fracture: Secondary | ICD-10-CM | POA: Diagnosis not present

## 2019-07-18 DIAGNOSIS — Z720 Tobacco use: Secondary | ICD-10-CM | POA: Diagnosis not present

## 2019-07-25 DIAGNOSIS — Z0001 Encounter for general adult medical examination with abnormal findings: Secondary | ICD-10-CM | POA: Diagnosis not present

## 2019-07-25 DIAGNOSIS — K529 Noninfective gastroenteritis and colitis, unspecified: Secondary | ICD-10-CM | POA: Diagnosis not present

## 2019-07-25 DIAGNOSIS — E538 Deficiency of other specified B group vitamins: Secondary | ICD-10-CM | POA: Diagnosis not present

## 2019-07-25 DIAGNOSIS — I679 Cerebrovascular disease, unspecified: Secondary | ICD-10-CM | POA: Diagnosis not present

## 2019-07-25 DIAGNOSIS — R5383 Other fatigue: Secondary | ICD-10-CM | POA: Diagnosis not present

## 2019-07-25 DIAGNOSIS — E559 Vitamin D deficiency, unspecified: Secondary | ICD-10-CM | POA: Diagnosis not present

## 2019-07-25 DIAGNOSIS — G319 Degenerative disease of nervous system, unspecified: Secondary | ICD-10-CM | POA: Diagnosis not present

## 2019-07-25 DIAGNOSIS — M810 Age-related osteoporosis without current pathological fracture: Secondary | ICD-10-CM | POA: Diagnosis not present

## 2019-07-25 DIAGNOSIS — Z1389 Encounter for screening for other disorder: Secondary | ICD-10-CM | POA: Diagnosis not present

## 2019-08-06 ENCOUNTER — Other Ambulatory Visit: Payer: Self-pay | Admitting: Orthopedic Surgery

## 2019-08-06 ENCOUNTER — Other Ambulatory Visit (HOSPITAL_COMMUNITY): Payer: Self-pay | Admitting: Orthopedic Surgery

## 2019-08-06 ENCOUNTER — Other Ambulatory Visit: Payer: Self-pay | Admitting: Specialist

## 2019-08-06 DIAGNOSIS — M4726 Other spondylosis with radiculopathy, lumbar region: Secondary | ICD-10-CM | POA: Diagnosis not present

## 2019-08-06 DIAGNOSIS — M25551 Pain in right hip: Secondary | ICD-10-CM

## 2019-08-06 DIAGNOSIS — Z96653 Presence of artificial knee joint, bilateral: Secondary | ICD-10-CM | POA: Diagnosis not present

## 2019-08-06 DIAGNOSIS — S72001A Fracture of unspecified part of neck of right femur, initial encounter for closed fracture: Secondary | ICD-10-CM | POA: Diagnosis not present

## 2019-08-18 DIAGNOSIS — M1991 Primary osteoarthritis, unspecified site: Secondary | ICD-10-CM | POA: Diagnosis not present

## 2019-08-18 DIAGNOSIS — M810 Age-related osteoporosis without current pathological fracture: Secondary | ICD-10-CM | POA: Diagnosis not present

## 2019-08-18 DIAGNOSIS — J449 Chronic obstructive pulmonary disease, unspecified: Secondary | ICD-10-CM | POA: Diagnosis not present

## 2019-08-18 DIAGNOSIS — Z720 Tobacco use: Secondary | ICD-10-CM | POA: Diagnosis not present

## 2019-08-20 ENCOUNTER — Other Ambulatory Visit: Payer: Self-pay

## 2019-08-20 ENCOUNTER — Ambulatory Visit (HOSPITAL_COMMUNITY)
Admission: RE | Admit: 2019-08-20 | Discharge: 2019-08-20 | Disposition: A | Discharge status: Home / Self Care | Payer: Medicare Other | Source: Ambulatory Visit | Attending: Orthopedic Surgery | Admitting: Orthopedic Surgery

## 2019-08-20 DIAGNOSIS — G894 Chronic pain syndrome: Secondary | ICD-10-CM | POA: Diagnosis not present

## 2019-08-20 DIAGNOSIS — M25551 Pain in right hip: Secondary | ICD-10-CM | POA: Diagnosis not present

## 2019-08-27 DIAGNOSIS — M7061 Trochanteric bursitis, right hip: Secondary | ICD-10-CM | POA: Diagnosis not present

## 2019-09-03 DIAGNOSIS — M47816 Spondylosis without myelopathy or radiculopathy, lumbar region: Secondary | ICD-10-CM | POA: Diagnosis not present

## 2019-09-03 DIAGNOSIS — Z96653 Presence of artificial knee joint, bilateral: Secondary | ICD-10-CM | POA: Diagnosis not present

## 2019-09-03 DIAGNOSIS — M7061 Trochanteric bursitis, right hip: Secondary | ICD-10-CM | POA: Diagnosis not present

## 2019-09-03 DIAGNOSIS — Z9181 History of falling: Secondary | ICD-10-CM | POA: Diagnosis not present

## 2019-09-04 DIAGNOSIS — M7061 Trochanteric bursitis, right hip: Secondary | ICD-10-CM | POA: Diagnosis not present

## 2019-09-04 DIAGNOSIS — M47816 Spondylosis without myelopathy or radiculopathy, lumbar region: Secondary | ICD-10-CM | POA: Diagnosis not present

## 2019-09-04 DIAGNOSIS — Z9181 History of falling: Secondary | ICD-10-CM | POA: Diagnosis not present

## 2019-09-04 DIAGNOSIS — Z96653 Presence of artificial knee joint, bilateral: Secondary | ICD-10-CM | POA: Diagnosis not present

## 2019-09-10 DIAGNOSIS — M7061 Trochanteric bursitis, right hip: Secondary | ICD-10-CM | POA: Diagnosis not present

## 2019-09-10 DIAGNOSIS — Z9181 History of falling: Secondary | ICD-10-CM | POA: Diagnosis not present

## 2019-09-10 DIAGNOSIS — M47816 Spondylosis without myelopathy or radiculopathy, lumbar region: Secondary | ICD-10-CM | POA: Diagnosis not present

## 2019-09-10 DIAGNOSIS — Z96653 Presence of artificial knee joint, bilateral: Secondary | ICD-10-CM | POA: Diagnosis not present

## 2019-09-11 DIAGNOSIS — Z9181 History of falling: Secondary | ICD-10-CM | POA: Diagnosis not present

## 2019-09-11 DIAGNOSIS — M7061 Trochanteric bursitis, right hip: Secondary | ICD-10-CM | POA: Diagnosis not present

## 2019-09-11 DIAGNOSIS — M47816 Spondylosis without myelopathy or radiculopathy, lumbar region: Secondary | ICD-10-CM | POA: Diagnosis not present

## 2019-09-11 DIAGNOSIS — Z96653 Presence of artificial knee joint, bilateral: Secondary | ICD-10-CM | POA: Diagnosis not present

## 2019-09-12 DIAGNOSIS — M7061 Trochanteric bursitis, right hip: Secondary | ICD-10-CM | POA: Diagnosis not present

## 2019-09-13 DIAGNOSIS — M7061 Trochanteric bursitis, right hip: Secondary | ICD-10-CM | POA: Diagnosis not present

## 2019-09-13 DIAGNOSIS — M47816 Spondylosis without myelopathy or radiculopathy, lumbar region: Secondary | ICD-10-CM | POA: Diagnosis not present

## 2019-09-13 DIAGNOSIS — Z9181 History of falling: Secondary | ICD-10-CM | POA: Diagnosis not present

## 2019-09-13 DIAGNOSIS — Z96653 Presence of artificial knee joint, bilateral: Secondary | ICD-10-CM | POA: Diagnosis not present

## 2019-09-15 DIAGNOSIS — Z720 Tobacco use: Secondary | ICD-10-CM | POA: Diagnosis not present

## 2019-09-15 DIAGNOSIS — M810 Age-related osteoporosis without current pathological fracture: Secondary | ICD-10-CM | POA: Diagnosis not present

## 2019-09-15 DIAGNOSIS — M1991 Primary osteoarthritis, unspecified site: Secondary | ICD-10-CM | POA: Diagnosis not present

## 2019-09-15 DIAGNOSIS — J449 Chronic obstructive pulmonary disease, unspecified: Secondary | ICD-10-CM | POA: Diagnosis not present

## 2019-09-16 DIAGNOSIS — Z9181 History of falling: Secondary | ICD-10-CM | POA: Diagnosis not present

## 2019-09-16 DIAGNOSIS — Z96653 Presence of artificial knee joint, bilateral: Secondary | ICD-10-CM | POA: Diagnosis not present

## 2019-09-16 DIAGNOSIS — M7061 Trochanteric bursitis, right hip: Secondary | ICD-10-CM | POA: Diagnosis not present

## 2019-09-16 DIAGNOSIS — M47816 Spondylosis without myelopathy or radiculopathy, lumbar region: Secondary | ICD-10-CM | POA: Diagnosis not present

## 2019-09-17 DIAGNOSIS — M7061 Trochanteric bursitis, right hip: Secondary | ICD-10-CM | POA: Diagnosis not present

## 2019-09-17 DIAGNOSIS — Z96653 Presence of artificial knee joint, bilateral: Secondary | ICD-10-CM | POA: Diagnosis not present

## 2019-09-17 DIAGNOSIS — Z9181 History of falling: Secondary | ICD-10-CM | POA: Diagnosis not present

## 2019-09-17 DIAGNOSIS — M47816 Spondylosis without myelopathy or radiculopathy, lumbar region: Secondary | ICD-10-CM | POA: Diagnosis not present

## 2019-09-20 DIAGNOSIS — M7061 Trochanteric bursitis, right hip: Secondary | ICD-10-CM | POA: Diagnosis not present

## 2019-09-20 DIAGNOSIS — Z9181 History of falling: Secondary | ICD-10-CM | POA: Diagnosis not present

## 2019-09-20 DIAGNOSIS — M47816 Spondylosis without myelopathy or radiculopathy, lumbar region: Secondary | ICD-10-CM | POA: Diagnosis not present

## 2019-09-20 DIAGNOSIS — Z96653 Presence of artificial knee joint, bilateral: Secondary | ICD-10-CM | POA: Diagnosis not present

## 2019-09-23 DIAGNOSIS — M7061 Trochanteric bursitis, right hip: Secondary | ICD-10-CM | POA: Diagnosis not present

## 2019-09-23 DIAGNOSIS — M47816 Spondylosis without myelopathy or radiculopathy, lumbar region: Secondary | ICD-10-CM | POA: Diagnosis not present

## 2019-09-23 DIAGNOSIS — Z96653 Presence of artificial knee joint, bilateral: Secondary | ICD-10-CM | POA: Diagnosis not present

## 2019-09-23 DIAGNOSIS — Z9181 History of falling: Secondary | ICD-10-CM | POA: Diagnosis not present

## 2019-09-24 DIAGNOSIS — G894 Chronic pain syndrome: Secondary | ICD-10-CM | POA: Diagnosis not present

## 2019-09-25 DIAGNOSIS — M47816 Spondylosis without myelopathy or radiculopathy, lumbar region: Secondary | ICD-10-CM | POA: Diagnosis not present

## 2019-09-25 DIAGNOSIS — M7061 Trochanteric bursitis, right hip: Secondary | ICD-10-CM | POA: Diagnosis not present

## 2019-09-25 DIAGNOSIS — Z9181 History of falling: Secondary | ICD-10-CM | POA: Diagnosis not present

## 2019-09-25 DIAGNOSIS — Z96653 Presence of artificial knee joint, bilateral: Secondary | ICD-10-CM | POA: Diagnosis not present

## 2019-09-27 DIAGNOSIS — M47816 Spondylosis without myelopathy or radiculopathy, lumbar region: Secondary | ICD-10-CM | POA: Diagnosis not present

## 2019-09-27 DIAGNOSIS — Z9181 History of falling: Secondary | ICD-10-CM | POA: Diagnosis not present

## 2019-09-27 DIAGNOSIS — M7061 Trochanteric bursitis, right hip: Secondary | ICD-10-CM | POA: Diagnosis not present

## 2019-09-27 DIAGNOSIS — Z96653 Presence of artificial knee joint, bilateral: Secondary | ICD-10-CM | POA: Diagnosis not present

## 2019-10-16 DIAGNOSIS — Z720 Tobacco use: Secondary | ICD-10-CM | POA: Diagnosis not present

## 2019-10-16 DIAGNOSIS — M810 Age-related osteoporosis without current pathological fracture: Secondary | ICD-10-CM | POA: Diagnosis not present

## 2019-10-16 DIAGNOSIS — M1991 Primary osteoarthritis, unspecified site: Secondary | ICD-10-CM | POA: Diagnosis not present

## 2019-10-16 DIAGNOSIS — J449 Chronic obstructive pulmonary disease, unspecified: Secondary | ICD-10-CM | POA: Diagnosis not present

## 2019-11-15 DIAGNOSIS — M810 Age-related osteoporosis without current pathological fracture: Secondary | ICD-10-CM | POA: Diagnosis not present

## 2019-11-15 DIAGNOSIS — Z720 Tobacco use: Secondary | ICD-10-CM | POA: Diagnosis not present

## 2019-11-15 DIAGNOSIS — M1991 Primary osteoarthritis, unspecified site: Secondary | ICD-10-CM | POA: Diagnosis not present

## 2019-11-15 DIAGNOSIS — J449 Chronic obstructive pulmonary disease, unspecified: Secondary | ICD-10-CM | POA: Diagnosis not present

## 2019-11-20 DIAGNOSIS — E063 Autoimmune thyroiditis: Secondary | ICD-10-CM | POA: Diagnosis not present

## 2019-11-20 DIAGNOSIS — G894 Chronic pain syndrome: Secondary | ICD-10-CM | POA: Diagnosis not present

## 2019-11-20 DIAGNOSIS — M1991 Primary osteoarthritis, unspecified site: Secondary | ICD-10-CM | POA: Diagnosis not present

## 2019-12-16 DIAGNOSIS — J449 Chronic obstructive pulmonary disease, unspecified: Secondary | ICD-10-CM | POA: Diagnosis not present

## 2019-12-16 DIAGNOSIS — M810 Age-related osteoporosis without current pathological fracture: Secondary | ICD-10-CM | POA: Diagnosis not present

## 2019-12-16 DIAGNOSIS — M1991 Primary osteoarthritis, unspecified site: Secondary | ICD-10-CM | POA: Diagnosis not present

## 2019-12-16 DIAGNOSIS — Z720 Tobacco use: Secondary | ICD-10-CM | POA: Diagnosis not present

## 2020-01-01 DIAGNOSIS — M1991 Primary osteoarthritis, unspecified site: Secondary | ICD-10-CM | POA: Diagnosis not present

## 2020-01-01 DIAGNOSIS — G894 Chronic pain syndrome: Secondary | ICD-10-CM | POA: Diagnosis not present

## 2020-01-01 DIAGNOSIS — J449 Chronic obstructive pulmonary disease, unspecified: Secondary | ICD-10-CM | POA: Diagnosis not present

## 2020-01-15 DIAGNOSIS — M810 Age-related osteoporosis without current pathological fracture: Secondary | ICD-10-CM | POA: Diagnosis not present

## 2020-01-15 DIAGNOSIS — J449 Chronic obstructive pulmonary disease, unspecified: Secondary | ICD-10-CM | POA: Diagnosis not present

## 2020-01-15 DIAGNOSIS — Z720 Tobacco use: Secondary | ICD-10-CM | POA: Diagnosis not present

## 2020-01-15 DIAGNOSIS — M1991 Primary osteoarthritis, unspecified site: Secondary | ICD-10-CM | POA: Diagnosis not present

## 2020-01-28 DIAGNOSIS — E440 Moderate protein-calorie malnutrition: Secondary | ICD-10-CM | POA: Diagnosis not present

## 2020-02-14 DIAGNOSIS — Z720 Tobacco use: Secondary | ICD-10-CM | POA: Diagnosis not present

## 2020-02-14 DIAGNOSIS — J449 Chronic obstructive pulmonary disease, unspecified: Secondary | ICD-10-CM | POA: Diagnosis not present

## 2020-02-14 DIAGNOSIS — M1991 Primary osteoarthritis, unspecified site: Secondary | ICD-10-CM | POA: Diagnosis not present

## 2020-02-14 DIAGNOSIS — M810 Age-related osteoporosis without current pathological fracture: Secondary | ICD-10-CM | POA: Diagnosis not present

## 2020-02-14 DIAGNOSIS — E7849 Other hyperlipidemia: Secondary | ICD-10-CM | POA: Diagnosis not present

## 2020-03-04 DIAGNOSIS — E440 Moderate protein-calorie malnutrition: Secondary | ICD-10-CM | POA: Diagnosis not present

## 2020-03-09 DIAGNOSIS — G894 Chronic pain syndrome: Secondary | ICD-10-CM | POA: Diagnosis not present

## 2020-04-04 DIAGNOSIS — E440 Moderate protein-calorie malnutrition: Secondary | ICD-10-CM | POA: Diagnosis not present

## 2020-04-13 DIAGNOSIS — J449 Chronic obstructive pulmonary disease, unspecified: Secondary | ICD-10-CM | POA: Diagnosis not present

## 2020-04-13 DIAGNOSIS — R197 Diarrhea, unspecified: Secondary | ICD-10-CM | POA: Diagnosis not present

## 2020-04-13 DIAGNOSIS — G894 Chronic pain syndrome: Secondary | ICD-10-CM | POA: Diagnosis not present

## 2020-04-13 DIAGNOSIS — M1991 Primary osteoarthritis, unspecified site: Secondary | ICD-10-CM | POA: Diagnosis not present

## 2020-04-16 DIAGNOSIS — M810 Age-related osteoporosis without current pathological fracture: Secondary | ICD-10-CM | POA: Diagnosis not present

## 2020-04-16 DIAGNOSIS — J449 Chronic obstructive pulmonary disease, unspecified: Secondary | ICD-10-CM | POA: Diagnosis not present

## 2020-04-16 DIAGNOSIS — Z720 Tobacco use: Secondary | ICD-10-CM | POA: Diagnosis not present

## 2020-04-16 DIAGNOSIS — M1991 Primary osteoarthritis, unspecified site: Secondary | ICD-10-CM | POA: Diagnosis not present

## 2020-05-04 DIAGNOSIS — E440 Moderate protein-calorie malnutrition: Secondary | ICD-10-CM | POA: Diagnosis not present

## 2020-05-16 DIAGNOSIS — M810 Age-related osteoporosis without current pathological fracture: Secondary | ICD-10-CM | POA: Diagnosis not present

## 2020-05-16 DIAGNOSIS — M1991 Primary osteoarthritis, unspecified site: Secondary | ICD-10-CM | POA: Diagnosis not present

## 2020-05-16 DIAGNOSIS — J449 Chronic obstructive pulmonary disease, unspecified: Secondary | ICD-10-CM | POA: Diagnosis not present

## 2020-05-16 DIAGNOSIS — Z720 Tobacco use: Secondary | ICD-10-CM | POA: Diagnosis not present

## 2020-06-02 DIAGNOSIS — M1991 Primary osteoarthritis, unspecified site: Secondary | ICD-10-CM | POA: Diagnosis not present

## 2020-06-02 DIAGNOSIS — G894 Chronic pain syndrome: Secondary | ICD-10-CM | POA: Diagnosis not present

## 2020-06-16 DIAGNOSIS — M810 Age-related osteoporosis without current pathological fracture: Secondary | ICD-10-CM | POA: Diagnosis not present

## 2020-06-16 DIAGNOSIS — J449 Chronic obstructive pulmonary disease, unspecified: Secondary | ICD-10-CM | POA: Diagnosis not present

## 2020-06-16 DIAGNOSIS — Z720 Tobacco use: Secondary | ICD-10-CM | POA: Diagnosis not present

## 2020-06-16 DIAGNOSIS — M1991 Primary osteoarthritis, unspecified site: Secondary | ICD-10-CM | POA: Diagnosis not present

## 2020-06-24 ENCOUNTER — Other Ambulatory Visit: Payer: Self-pay | Admitting: Internal Medicine

## 2020-06-24 ENCOUNTER — Other Ambulatory Visit (HOSPITAL_COMMUNITY): Payer: Self-pay | Admitting: Internal Medicine

## 2020-06-24 DIAGNOSIS — R109 Unspecified abdominal pain: Secondary | ICD-10-CM

## 2020-06-24 DIAGNOSIS — Z23 Encounter for immunization: Secondary | ICD-10-CM | POA: Diagnosis not present

## 2020-06-24 DIAGNOSIS — J449 Chronic obstructive pulmonary disease, unspecified: Secondary | ICD-10-CM | POA: Diagnosis not present

## 2020-06-24 DIAGNOSIS — M1991 Primary osteoarthritis, unspecified site: Secondary | ICD-10-CM | POA: Diagnosis not present

## 2020-06-24 DIAGNOSIS — G894 Chronic pain syndrome: Secondary | ICD-10-CM | POA: Diagnosis not present

## 2020-07-04 DIAGNOSIS — E440 Moderate protein-calorie malnutrition: Secondary | ICD-10-CM | POA: Diagnosis not present

## 2020-07-17 DIAGNOSIS — M1991 Primary osteoarthritis, unspecified site: Secondary | ICD-10-CM | POA: Diagnosis not present

## 2020-07-17 DIAGNOSIS — M810 Age-related osteoporosis without current pathological fracture: Secondary | ICD-10-CM | POA: Diagnosis not present

## 2020-07-17 DIAGNOSIS — J449 Chronic obstructive pulmonary disease, unspecified: Secondary | ICD-10-CM | POA: Diagnosis not present

## 2020-07-17 DIAGNOSIS — Z720 Tobacco use: Secondary | ICD-10-CM | POA: Diagnosis not present

## 2020-07-30 DIAGNOSIS — E063 Autoimmune thyroiditis: Secondary | ICD-10-CM | POA: Diagnosis not present

## 2020-07-30 DIAGNOSIS — J449 Chronic obstructive pulmonary disease, unspecified: Secondary | ICD-10-CM | POA: Diagnosis not present

## 2020-07-30 DIAGNOSIS — G894 Chronic pain syndrome: Secondary | ICD-10-CM | POA: Diagnosis not present

## 2020-08-03 ENCOUNTER — Emergency Department (HOSPITAL_COMMUNITY): Payer: Medicare Other

## 2020-08-03 ENCOUNTER — Inpatient Hospital Stay (HOSPITAL_COMMUNITY)
Admission: EM | Admit: 2020-08-03 | Discharge: 2020-08-06 | DRG: 177 | Disposition: A | Discharge status: Skilled Nursing Facility | Payer: Medicare Other | Attending: Internal Medicine | Admitting: Internal Medicine

## 2020-08-03 ENCOUNTER — Encounter (HOSPITAL_COMMUNITY): Payer: Self-pay

## 2020-08-03 ENCOUNTER — Other Ambulatory Visit: Payer: Self-pay

## 2020-08-03 DIAGNOSIS — Z7983 Long term (current) use of bisphosphonates: Secondary | ICD-10-CM

## 2020-08-03 DIAGNOSIS — R2681 Unsteadiness on feet: Secondary | ICD-10-CM | POA: Diagnosis not present

## 2020-08-03 DIAGNOSIS — N179 Acute kidney failure, unspecified: Secondary | ICD-10-CM | POA: Diagnosis present

## 2020-08-03 DIAGNOSIS — Z96653 Presence of artificial knee joint, bilateral: Secondary | ICD-10-CM | POA: Diagnosis present

## 2020-08-03 DIAGNOSIS — I69354 Hemiplegia and hemiparesis following cerebral infarction affecting left non-dominant side: Secondary | ICD-10-CM

## 2020-08-03 DIAGNOSIS — Z743 Need for continuous supervision: Secondary | ICD-10-CM | POA: Diagnosis not present

## 2020-08-03 DIAGNOSIS — M6281 Muscle weakness (generalized): Secondary | ICD-10-CM | POA: Diagnosis not present

## 2020-08-03 DIAGNOSIS — E872 Acidosis: Secondary | ICD-10-CM | POA: Diagnosis not present

## 2020-08-03 DIAGNOSIS — Z79890 Hormone replacement therapy (postmenopausal): Secondary | ICD-10-CM

## 2020-08-03 DIAGNOSIS — E869 Volume depletion, unspecified: Secondary | ICD-10-CM | POA: Diagnosis present

## 2020-08-03 DIAGNOSIS — J440 Chronic obstructive pulmonary disease with acute lower respiratory infection: Secondary | ICD-10-CM | POA: Diagnosis present

## 2020-08-03 DIAGNOSIS — N1832 Chronic kidney disease, stage 3b: Secondary | ICD-10-CM

## 2020-08-03 DIAGNOSIS — R000 Tachycardia, unspecified: Secondary | ICD-10-CM | POA: Diagnosis not present

## 2020-08-03 DIAGNOSIS — Z720 Tobacco use: Secondary | ICD-10-CM

## 2020-08-03 DIAGNOSIS — J9601 Acute respiratory failure with hypoxia: Secondary | ICD-10-CM

## 2020-08-03 DIAGNOSIS — R739 Hyperglycemia, unspecified: Secondary | ICD-10-CM

## 2020-08-03 DIAGNOSIS — Z885 Allergy status to narcotic agent status: Secondary | ICD-10-CM

## 2020-08-03 DIAGNOSIS — G8929 Other chronic pain: Secondary | ICD-10-CM | POA: Diagnosis present

## 2020-08-03 DIAGNOSIS — Z7401 Bed confinement status: Secondary | ICD-10-CM | POA: Diagnosis not present

## 2020-08-03 DIAGNOSIS — Z9842 Cataract extraction status, left eye: Secondary | ICD-10-CM

## 2020-08-03 DIAGNOSIS — R410 Disorientation, unspecified: Secondary | ICD-10-CM | POA: Diagnosis not present

## 2020-08-03 DIAGNOSIS — I69828 Other speech and language deficits following other cerebrovascular disease: Secondary | ICD-10-CM | POA: Diagnosis not present

## 2020-08-03 DIAGNOSIS — R6510 Systemic inflammatory response syndrome (SIRS) of non-infectious origin without acute organ dysfunction: Secondary | ICD-10-CM | POA: Diagnosis present

## 2020-08-03 DIAGNOSIS — E039 Hypothyroidism, unspecified: Secondary | ICD-10-CM | POA: Diagnosis present

## 2020-08-03 DIAGNOSIS — E785 Hyperlipidemia, unspecified: Secondary | ICD-10-CM | POA: Diagnosis present

## 2020-08-03 DIAGNOSIS — J1282 Pneumonia due to coronavirus disease 2019: Secondary | ICD-10-CM | POA: Diagnosis not present

## 2020-08-03 DIAGNOSIS — M21371 Foot drop, right foot: Secondary | ICD-10-CM | POA: Diagnosis present

## 2020-08-03 DIAGNOSIS — E1165 Type 2 diabetes mellitus with hyperglycemia: Secondary | ICD-10-CM | POA: Diagnosis not present

## 2020-08-03 DIAGNOSIS — M170 Bilateral primary osteoarthritis of knee: Secondary | ICD-10-CM | POA: Diagnosis present

## 2020-08-03 DIAGNOSIS — R296 Repeated falls: Secondary | ICD-10-CM | POA: Diagnosis present

## 2020-08-03 DIAGNOSIS — Z8249 Family history of ischemic heart disease and other diseases of the circulatory system: Secondary | ICD-10-CM

## 2020-08-03 DIAGNOSIS — Z9851 Tubal ligation status: Secondary | ICD-10-CM

## 2020-08-03 DIAGNOSIS — Z888 Allergy status to other drugs, medicaments and biological substances status: Secondary | ICD-10-CM

## 2020-08-03 DIAGNOSIS — M810 Age-related osteoporosis without current pathological fracture: Secondary | ICD-10-CM | POA: Diagnosis present

## 2020-08-03 DIAGNOSIS — N390 Urinary tract infection, site not specified: Secondary | ICD-10-CM | POA: Diagnosis not present

## 2020-08-03 DIAGNOSIS — I129 Hypertensive chronic kidney disease with stage 1 through stage 4 chronic kidney disease, or unspecified chronic kidney disease: Secondary | ICD-10-CM | POA: Diagnosis present

## 2020-08-03 DIAGNOSIS — Z221 Carrier of other intestinal infectious diseases: Secondary | ICD-10-CM

## 2020-08-03 DIAGNOSIS — E1122 Type 2 diabetes mellitus with diabetic chronic kidney disease: Secondary | ICD-10-CM | POA: Diagnosis not present

## 2020-08-03 DIAGNOSIS — I499 Cardiac arrhythmia, unspecified: Secondary | ICD-10-CM | POA: Diagnosis not present

## 2020-08-03 DIAGNOSIS — F17210 Nicotine dependence, cigarettes, uncomplicated: Secondary | ICD-10-CM

## 2020-08-03 DIAGNOSIS — R0902 Hypoxemia: Secondary | ICD-10-CM

## 2020-08-03 DIAGNOSIS — Z79899 Other long term (current) drug therapy: Secondary | ICD-10-CM

## 2020-08-03 DIAGNOSIS — J449 Chronic obstructive pulmonary disease, unspecified: Secondary | ICD-10-CM | POA: Diagnosis not present

## 2020-08-03 DIAGNOSIS — Z961 Presence of intraocular lens: Secondary | ICD-10-CM | POA: Diagnosis not present

## 2020-08-03 DIAGNOSIS — G9341 Metabolic encephalopathy: Secondary | ICD-10-CM

## 2020-08-03 DIAGNOSIS — R4182 Altered mental status, unspecified: Secondary | ICD-10-CM

## 2020-08-03 DIAGNOSIS — R7401 Elevation of levels of liver transaminase levels: Secondary | ICD-10-CM | POA: Diagnosis not present

## 2020-08-03 DIAGNOSIS — F419 Anxiety disorder, unspecified: Secondary | ICD-10-CM | POA: Diagnosis present

## 2020-08-03 DIAGNOSIS — Z9841 Cataract extraction status, right eye: Secondary | ICD-10-CM

## 2020-08-03 DIAGNOSIS — U071 COVID-19: Secondary | ICD-10-CM | POA: Diagnosis not present

## 2020-08-03 DIAGNOSIS — Z66 Do not resuscitate: Secondary | ICD-10-CM | POA: Diagnosis not present

## 2020-08-03 DIAGNOSIS — R279 Unspecified lack of coordination: Secondary | ICD-10-CM | POA: Diagnosis not present

## 2020-08-03 LAB — COMPREHENSIVE METABOLIC PANEL
ALT: 100 U/L — ABNORMAL HIGH (ref 0–44)
AST: 99 U/L — ABNORMAL HIGH (ref 15–41)
Albumin: 3.3 g/dL — ABNORMAL LOW (ref 3.5–5.0)
Alkaline Phosphatase: 49 U/L (ref 38–126)
Anion gap: 10 (ref 5–15)
BUN: 15 mg/dL (ref 8–23)
CO2: 25 mmol/L (ref 22–32)
Calcium: 8.4 mg/dL — ABNORMAL LOW (ref 8.9–10.3)
Chloride: 102 mmol/L (ref 98–111)
Creatinine, Ser: 1.61 mg/dL — ABNORMAL HIGH (ref 0.44–1.00)
GFR, Estimated: 33 mL/min — ABNORMAL LOW (ref >60)
Glucose, Bld: 282 mg/dL — ABNORMAL HIGH (ref 70–99)
Potassium: 3.7 mmol/L (ref 3.5–5.1)
Sodium: 137 mmol/L (ref 135–145)
Total Bilirubin: 0.4 mg/dL (ref 0.3–1.2)
Total Protein: 6.7 g/dL (ref 6.5–8.1)

## 2020-08-03 LAB — POC SARS CORONAVIRUS 2 AG -  ED: SARS Coronavirus 2 Ag: POSITIVE — AB

## 2020-08-03 LAB — URINALYSIS, COMPLETE (UACMP) WITH MICROSCOPIC
Bilirubin Urine: NEGATIVE
Glucose, UA: NEGATIVE mg/dL
Hgb urine dipstick: NEGATIVE
Ketones, ur: NEGATIVE mg/dL
Leukocytes,Ua: NEGATIVE
Nitrite: NEGATIVE
Protein, ur: NEGATIVE mg/dL
Specific Gravity, Urine: 1.015 (ref 1.005–1.030)
pH: 5 (ref 5.0–8.0)

## 2020-08-03 LAB — TSH: TSH: 0.44 u[IU]/mL (ref 0.350–4.500)

## 2020-08-03 LAB — CBC WITH DIFFERENTIAL/PLATELET
Abs Immature Granulocytes: 0.02 10*3/uL (ref 0.00–0.07)
Basophils Absolute: 0 10*3/uL (ref 0.0–0.1)
Basophils Relative: 0 %
Eosinophils Absolute: 0 10*3/uL (ref 0.0–0.5)
Eosinophils Relative: 0 %
HCT: 41.3 % (ref 36.0–46.0)
Hemoglobin: 13 g/dL (ref 12.0–15.0)
Immature Granulocytes: 1 %
Lymphocytes Relative: 19 %
Lymphs Abs: 0.8 10*3/uL (ref 0.7–4.0)
MCH: 30.2 pg (ref 26.0–34.0)
MCHC: 31.5 g/dL (ref 30.0–36.0)
MCV: 96 fL (ref 80.0–100.0)
Monocytes Absolute: 0.4 10*3/uL (ref 0.1–1.0)
Monocytes Relative: 8 %
Neutro Abs: 3 10*3/uL (ref 1.7–7.7)
Neutrophils Relative %: 72 %
Platelets: 162 10*3/uL (ref 150–400)
RBC: 4.3 MIL/uL (ref 3.87–5.11)
RDW: 14.4 % (ref 11.5–15.5)
WBC: 4.2 10*3/uL (ref 4.0–10.5)
nRBC: 0 % (ref 0.0–0.2)

## 2020-08-03 LAB — FIBRINOGEN: Fibrinogen: 553 mg/dL — ABNORMAL HIGH (ref 210–475)

## 2020-08-03 LAB — PROCALCITONIN: Procalcitonin: 0.1 ng/mL

## 2020-08-03 LAB — HEMOGLOBIN A1C
Hgb A1c MFr Bld: 6.7 % — ABNORMAL HIGH (ref 4.8–5.6)
Mean Plasma Glucose: 145.59 mg/dL

## 2020-08-03 LAB — FERRITIN: Ferritin: 646 ng/mL — ABNORMAL HIGH (ref 11–307)

## 2020-08-03 LAB — CBG MONITORING, ED
Glucose-Capillary: 240 mg/dL — ABNORMAL HIGH (ref 70–99)
Glucose-Capillary: 236 mg/dL — ABNORMAL HIGH (ref 70–99)

## 2020-08-03 LAB — LACTATE DEHYDROGENASE: LDH: 315 U/L — ABNORMAL HIGH (ref 98–192)

## 2020-08-03 LAB — LACTIC ACID, PLASMA
Lactic Acid, Venous: 3.3 mmol/L (ref 0.5–1.9)
Lactic Acid, Venous: 2.2 mmol/L (ref 0.5–1.9)
Lactic Acid, Venous: 1.7 mmol/L (ref 0.5–1.9)

## 2020-08-03 LAB — C-REACTIVE PROTEIN: CRP: 7.5 mg/dL — ABNORMAL HIGH (ref <1.0)

## 2020-08-03 LAB — D-DIMER, QUANTITATIVE: D-Dimer, Quant: 1.12 ug/mL-FEU — ABNORMAL HIGH (ref 0.00–0.50)

## 2020-08-03 LAB — TRIGLYCERIDES: Triglycerides: 103 mg/dL (ref <150)

## 2020-08-03 MED ORDER — FAMOTIDINE 20 MG PO TABS
20.0000 mg | ORAL_TABLET | Freq: Every day | ORAL | Status: DC
  Administered 2020-08-04 – 2020-08-06 (×3): 20 mg via ORAL
  Filled 2020-08-03 (×3): qty 1

## 2020-08-03 MED ORDER — ONDANSETRON HCL 4 MG/2ML IJ SOLN
4.0000 mg | Freq: Four times a day (QID) | INTRAMUSCULAR | Status: DC | PRN
  Administered 2020-08-06: 4 mg via INTRAVENOUS
  Filled 2020-08-03 (×2): qty 2

## 2020-08-03 MED ORDER — HYDROCOD POLST-CPM POLST ER 10-8 MG/5ML PO SUER: 5.0000 mL | Freq: Two times a day (BID) | ORAL | Status: DC | PRN

## 2020-08-03 MED ORDER — MECLIZINE HCL 12.5 MG PO TABS: 25.0000 mg | ORAL_TABLET | Freq: Three times a day (TID) | ORAL | Status: DC | PRN

## 2020-08-03 MED ORDER — LEVOTHYROXINE SODIUM 75 MCG PO TABS
75.0000 ug | ORAL_TABLET | Freq: Every day | ORAL | Status: DC
  Administered 2020-08-04 – 2020-08-06 (×3): 75 ug via ORAL
  Filled 2020-08-03 (×3): qty 1

## 2020-08-03 MED ORDER — SODIUM CHLORIDE 0.9 % IV SOLN: 100.0000 mg | Freq: Every day | INTRAVENOUS | Status: DC

## 2020-08-03 MED ORDER — ALPRAZOLAM 1 MG PO TABS
1.0000 mg | ORAL_TABLET | Freq: Every day | ORAL | Status: DC
  Administered 2020-08-03 – 2020-08-05 (×3): 1 mg via ORAL
  Filled 2020-08-03 (×3): qty 1

## 2020-08-03 MED ORDER — ASCORBIC ACID 500 MG PO TABS
500.0000 mg | ORAL_TABLET | Freq: Every day | ORAL | Status: DC
  Administered 2020-08-04 – 2020-08-06 (×3): 500 mg via ORAL
  Filled 2020-08-03 (×3): qty 1

## 2020-08-03 MED ORDER — TERBINAFINE HCL 250 MG PO TABS
250.0000 mg | ORAL_TABLET | Freq: Every evening | ORAL | Status: DC
  Administered 2020-08-04 – 2020-08-05 (×2): 250 mg via ORAL
  Filled 2020-08-03 (×4): qty 1

## 2020-08-03 MED ORDER — METHYLPREDNISOLONE SODIUM SUCC 40 MG IJ SOLR
0.5000 mg/kg | Freq: Two times a day (BID) | INTRAMUSCULAR | Status: AC
  Administered 2020-08-03 – 2020-08-06 (×6): 30.8 mg via INTRAVENOUS
  Filled 2020-08-03 (×6): qty 1

## 2020-08-03 MED ORDER — DEXAMETHASONE SODIUM PHOSPHATE 10 MG/ML IJ SOLN
8.0000 mg | Freq: Once | INTRAMUSCULAR | Status: AC
  Administered 2020-08-03: 8 mg via INTRAVENOUS
  Filled 2020-08-03: qty 1

## 2020-08-03 MED ORDER — INSULIN ASPART 100 UNIT/ML ~~LOC~~ SOLN
0.0000 [IU] | Freq: Three times a day (TID) | SUBCUTANEOUS | Status: DC
  Administered 2020-08-03: 3 [IU] via SUBCUTANEOUS
  Administered 2020-08-04: 7 [IU] via SUBCUTANEOUS
  Administered 2020-08-04: 5 [IU] via SUBCUTANEOUS
  Administered 2020-08-04: 7 [IU] via SUBCUTANEOUS
  Administered 2020-08-05: 2 [IU] via SUBCUTANEOUS
  Administered 2020-08-05: 3 [IU] via SUBCUTANEOUS
  Administered 2020-08-05 – 2020-08-06 (×2): 2 [IU] via SUBCUTANEOUS
  Administered 2020-08-06: 3 [IU] via SUBCUTANEOUS
  Filled 2020-08-03: qty 1

## 2020-08-03 MED ORDER — UMECLIDINIUM-VILANTEROL 62.5-25 MCG/INH IN AEPB: 1.0000 | INHALATION_SPRAY | Freq: Every day | RESPIRATORY_TRACT | Status: DC

## 2020-08-03 MED ORDER — ATORVASTATIN CALCIUM 20 MG PO TABS
20.0000 mg | ORAL_TABLET | Freq: Every day | ORAL | Status: DC
  Administered 2020-08-03: 20 mg via ORAL
  Filled 2020-08-03: qty 1

## 2020-08-03 MED ORDER — LACTATED RINGERS IV SOLN: INTRAVENOUS | Status: DC

## 2020-08-03 MED ORDER — SODIUM CHLORIDE 0.9 % IV SOLN
100.0000 mg | INTRAVENOUS | Status: AC
  Administered 2020-08-03 (×2): 100 mg via INTRAVENOUS
  Filled 2020-08-03 (×2): qty 20

## 2020-08-03 MED ORDER — SODIUM CHLORIDE 0.9 % IV BOLUS
500.0000 mL | Freq: Once | INTRAVENOUS | Status: AC
  Administered 2020-08-03: 500 mL via INTRAVENOUS

## 2020-08-03 MED ORDER — HEPARIN SODIUM (PORCINE) 5000 UNIT/ML IJ SOLN
5000.0000 [IU] | Freq: Three times a day (TID) | INTRAMUSCULAR | Status: DC
  Administered 2020-08-03 – 2020-08-06 (×10): 5000 [IU] via SUBCUTANEOUS
  Filled 2020-08-03 (×10): qty 1

## 2020-08-03 MED ORDER — INSULIN ASPART 100 UNIT/ML ~~LOC~~ SOLN
0.0000 [IU] | Freq: Every day | SUBCUTANEOUS | Status: DC
  Administered 2020-08-03 – 2020-08-04 (×2): 2 [IU] via SUBCUTANEOUS
  Administered 2020-08-05: 3 [IU] via SUBCUTANEOUS

## 2020-08-03 MED ORDER — LACTATED RINGERS IV BOLUS
1000.0000 mL | Freq: Once | INTRAVENOUS | Status: AC
  Administered 2020-08-03: 1000 mL via INTRAVENOUS

## 2020-08-03 MED ORDER — ONDANSETRON HCL 4 MG PO TABS: 4.0000 mg | ORAL_TABLET | Freq: Four times a day (QID) | ORAL | Status: DC | PRN

## 2020-08-03 MED ORDER — SODIUM CHLORIDE 0.9 % IV SOLN
100.0000 mg | Freq: Every day | INTRAVENOUS | Status: DC
  Administered 2020-08-04 – 2020-08-06 (×3): 100 mg via INTRAVENOUS
  Filled 2020-08-03 (×3): qty 20

## 2020-08-03 MED ORDER — TRAZODONE HCL 50 MG PO TABS: 50.0000 mg | ORAL_TABLET | Freq: Every day | ORAL | Status: DC

## 2020-08-03 MED ORDER — ZINC SULFATE 220 (50 ZN) MG PO CAPS
220.0000 mg | ORAL_CAPSULE | Freq: Every day | ORAL | Status: DC
  Administered 2020-08-04 – 2020-08-06 (×3): 220 mg via ORAL
  Filled 2020-08-03 (×3): qty 1

## 2020-08-03 MED ORDER — SODIUM CHLORIDE 0.9 % IV SOLN: 200.0000 mg | Freq: Once | INTRAVENOUS | Status: DC

## 2020-08-03 MED ORDER — SULFAMETHOXAZOLE-TRIMETHOPRIM 400-80 MG PO TABS: 1.0000 | ORAL_TABLET | Freq: Every day | ORAL | Status: DC

## 2020-08-03 MED ORDER — PREDNISONE 20 MG PO TABS: 50.0000 mg | ORAL_TABLET | Freq: Every day | ORAL | Status: DC

## 2020-08-03 MED ORDER — NICOTINE 7 MG/24HR TD PT24
7.0000 mg | MEDICATED_PATCH | Freq: Every day | TRANSDERMAL | Status: DC
  Administered 2020-08-05 – 2020-08-06 (×2): 7 mg via TRANSDERMAL
  Filled 2020-08-03 (×3): qty 1

## 2020-08-03 MED ORDER — ACETAMINOPHEN 325 MG PO TABS: 650.0000 mg | ORAL_TABLET | Freq: Four times a day (QID) | ORAL | Status: DC | PRN

## 2020-08-03 MED ORDER — ACETAMINOPHEN 500 MG PO TABS
500.0000 mg | ORAL_TABLET | Freq: Once | ORAL | Status: AC
  Administered 2020-08-03: 500 mg via ORAL
  Filled 2020-08-03: qty 1

## 2020-08-03 MED ORDER — TRAZODONE HCL 50 MG PO TABS: 50.0000 mg | ORAL_TABLET | Freq: Every evening | ORAL | Status: DC | PRN

## 2020-08-03 MED ORDER — GUAIFENESIN-DM 100-10 MG/5ML PO SYRP: 10.0000 mL | ORAL_SOLUTION | ORAL | Status: DC | PRN

## 2020-08-03 MED ORDER — UMECLIDINIUM-VILANTEROL 62.5-25 MCG/INH IN AEPB
1.0000 | INHALATION_SPRAY | Freq: Every day | RESPIRATORY_TRACT | Status: DC
  Administered 2020-08-04 – 2020-08-06 (×3): 1 via RESPIRATORY_TRACT
  Filled 2020-08-03: qty 14

## 2020-08-03 MED ORDER — HYDROMORPHONE HCL 1 MG/ML IJ SOLN: 0.5000 mg | INTRAMUSCULAR | Status: DC | PRN

## 2020-08-03 NOTE — ED Triage Notes (Signed)
Pt brought to ED via [facility] for increased confusion. Pt on cipro for UTI now. Pt alert and oriented x 4.

## 2020-08-03 NOTE — ED Provider Notes (Signed)
[facility]Y [facility] Provider Note   CSN: [id number] Arrival date & time: 08/03/20  1019     History Chief Complaint  Patient presents with  . Altered Mental Status    Amanda Armstrong Ruts Kurihara is a 75 y.o. female with past medical history significant for CVA not on anticoagulation, HTN, HLD, thyroid disorder on Synthroid, vertigo, chronic pain on opiates, and frequent falls who presents the ED via EMS for confusion.  I obtained history from EMS who states that she was recently prescribed ciprofloxacin on 07/30/2020 for urinary tract infection and has approximately 3 doses remaining.  Patient lives at home with her husband who voiced concern for stroke, last known normal was not conveyed to EMS.  Instead, husband reported that she has been increasingly confused "for a while".  CBG was also noted to be elevated in the 300s per EMS.  She was also noted to be borderline hypoxic.   I reviewed patient's PDMP and patient is treated with Vicodin 10-325 every 4 hours as needed for pain in addition to 1 mg Xanax daily PRN, most recently prescribed 06/2020.  I spoke with patient's husband who reports that for the past 1 to 2 weeks she has been acting particularly confused, weak, and "different".  He states that it is a deviation from her normal baseline.  She can typically ambulate with a walker, but he is having difficulty getting her up to go to the restroom.  He states that she is reluctant to "move her feet".  He states that 1 time she had a UTI that was so bad she became septic and needed to be admitted, questions whether or not this is cause for her altered mental status.  He states that nobody in the house has been sick and everyone has had all of their COVID-19 immunizations and boosters.  On my examination, patient is a rather poor historian.  She is alert and oriented x3 and is able to tell me the day of the week, the month, and the current president.  She states that she simply has been  feeling poorly the past 1 to 2 weeks and cannot further clarify specific symptoms.  She endorses mild left-sided arm weakness, but then tells me that it is chronic focal deficit from prior stroke.  She denies any new deficits.  She is tachypneic in the room in mid 20s and hypoxic to 87% on RA.  Placed on 2 L supplemental O2 via [place].  She is also tachycardic.  She denies any chest pain or pain elsewhere.  She states that she has been taking all of her medications including Vicodin every 4 hours.  She reports that she has been eating and drinking okay and that her last bowel movement was last night, soft brown.    Level 5 caveat due to poor historian/AMS.   HPI     Past Medical History:  Diagnosis Date  . Anxiety   . Arthritis    Bilateral Knee DJD  . Chronic pain   . Chronic pain   . Chronically on opiate therapy   . Clostridium difficile carrier    pt stated that she has intermittent sx  . Clostridium difficile infection   . Daytime somnolence   . Frequent falls   . Headache(784.0)    SINUS HEADACHE OCCASSIONALLY  . Hypertension   . Hypothyroidism   . Multifactorial gait disorder   . Osteoporosis   . Right foot drop   . Snoring   . Stroke (  HCC)   . Tobacco use   . Vertigo   . Weakness     Patient Active Problem List   Diagnosis Date Noted  . Acute hypoxemic respiratory failure due to COVID-19 (HCC) 08/03/2020  . UTI (urinary tract infection) 01/29/2016  . Nicotine dependence 09/07/2015  . Enteritis due to Clostridium difficile 08/29/2013  . Weakness 08/02/2013  . Diarrhea 08/02/2013  . Acute pyelonephritis 05/28/2013  . UTI (lower urinary tract infection) 05/27/2013  . Tachypnea 05/27/2013  . Hyperglycemia 05/27/2013  . Arthritis   . Anxiety   . Hypothyroidism     Past Surgical History:  Procedure Laterality Date  . CATARACT EXTRACTION W/PHACO Right 06/28/2016   Procedure: CATARACT EXTRACTION PHACO AND INTRAOCULAR LENS PLACEMENT (IOC);  Surgeon: Jethro Bolus,  MD;  Location: [facility];  Service: Ophthalmology;  Laterality: Right;  CDE: 11.10  . CATARACT EXTRACTION W/PHACO Left 07/26/2016   Procedure: CATARACT EXTRACTION PHACO AND INTRAOCULAR LENS PLACEMENT (IOC);  Surgeon: Jethro Bolus, MD;  Location: [facility];  Service: Ophthalmology;  Laterality: Left;  CDE: 8.88  . JOINT REPLACEMENT  11/08/3010   right total knee  . TOTAL KNEE ARTHROPLASTY  11/07/2011   Procedure: TOTAL KNEE ARTHROPLASTY;  Surgeon: Nilda Simmer, MD;  Location: [facility] OR;  Service: Orthopedics;  Laterality: Left;  DR Thurston Hole WANTS 90 MINUTES FOR THIS CASE  . TOTAL KNEE ARTHROPLASTY Right 2011  . TUBAL LIGATION  1982     OB History    Gravida  4   Para  4   Term  4   Preterm      AB      Living        SAB      IAB      Ectopic      Multiple      Live Births              Family History  Problem Relation Age of Onset  . Heart attack Mother   . COPD Father   . Arthritis Sister   . Heart disease Brother     Social History   Tobacco Use  . Smoking status: Current Every Day Smoker    Packs/day: 0.50    Years: 48.00    Pack years: 24.00    Types: Cigarettes    Last attempt to quit: 06/15/2012    Years since quitting: 8.1  . Smokeless tobacco: Never Used  . Tobacco comment: 12/29/14 1/2 PPD  Vaping Use  . Vaping Use: Never used  Substance Use Topics  . Alcohol use: No    Alcohol/week: 0.0 standard drinks  . Drug use: No    Home Medications Prior to Admission medications   Medication Sig Start Date End Date Taking? Authorizing Provider  acetaminophen (TYLENOL) 325 MG tablet Take 2 tablets (650 mg total) by mouth every 6 (six) hours as needed for up to 30 doses for mild pain or moderate pain. 07/15/19   Terald Sleeper, MD  alendronate (FOSAMAX) 70 MG tablet Take 70 mg by mouth every Sunday.  04/05/13   [provider]  ALPRAZolam Prudy Feeler) 1 MG tablet Take 1 tablet (1 mg total) by mouth 3 (three) times daily. Patient taking differently: Take 1  mg by mouth at bedtime.  02/01/16   Erick Blinks, MD  atorvastatin (LIPITOR) 20 MG tablet Take 20 mg by mouth at bedtime. 09/14/15   [provider]  cephALEXin (KEFLEX) 500 MG capsule Take 1 capsule (500 mg total) by mouth  4 (four) times daily. Patient not taking: Reported on 12/17/2018 11/26/18   Bethann BerkshireZammit, Joseph, MD  famotidine (PEPCID) 20 MG tablet Take 1 tablet (20 mg total) by mouth 2 (two) times daily. 01/11/19   Dartha LodgeFord, Kelsey N, PA-C  furosemide (LASIX) 20 MG tablet Take 20 mg by mouth 2 (two) times daily.     [provider]  ibuprofen (ADVIL) 400 MG tablet Take 1 tablet (400 mg total) by mouth 2 (two) times a day. 01/11/19   Dartha LodgeFord, Kelsey N, PA-C  levothyroxine (SYNTHROID) 100 MCG tablet Take 75 mcg by mouth daily before breakfast.  08/29/15   [provider]  lisinopril (ZESTRIL) 20 MG tablet Take 20 mg by mouth 2 (two) times a day.    [provider]  meclizine (ANTIVERT) 25 MG tablet Take 1 tablet (25 mg total) by mouth 3 (three) times daily as needed for dizziness. 04/13/16   Lavera GuiseLiu, Dana Duo, MD  methocarbamol (ROBAXIN) 500 MG tablet Take 1 tablet (500 mg total) by mouth 2 (two) times daily. 01/11/19   Dartha LodgeFord, Kelsey N, PA-C  sulfamethoxazole-trimethoprim (BACTRIM) 400-80 MG tablet Take 1 tablet by mouth at bedtime.    [provider]  terbinafine (LAMISIL) 250 MG tablet Take 250 mg by mouth every evening.    [provider]  traZODone (DESYREL) 50 MG tablet Take 50 mg by mouth at bedtime.    [provider]  umeclidinium-vilanterol (ANORO ELLIPTA) 62.5-25 MCG/INH AEPB Inhale 1 puff into the lungs daily.    [provider]    Allergies    Iron, Oxycodone, and Sudafed [pseudoephedrine hcl]  Review of Systems   Review of Systems  All other systems reviewed and are negative.   Physical Exam Updated Vital Signs BP 118/71   Pulse (!) 108   Temp (!) 101.3 F (38.5 C) (Rectal)   Resp (!) 31   Ht 4\' 10"  (1.473 m)   Wt  61.2 kg   SpO2 97%   BMI 28.22 kg/m   Physical Exam Vitals and nursing note reviewed. Exam conducted with a chaperone present.  Constitutional:      General: She is not in acute distress.    Appearance: She is ill-appearing.  HENT:     Head: Normocephalic and atraumatic.     Mouth/Throat:     Mouth: Mucous membranes are dry.  Eyes:     General: No scleral icterus.    Conjunctiva/sclera: Conjunctivae normal.  Cardiovascular:     Rate and Rhythm: Regular rhythm. Tachycardia present.     Pulses: Normal pulses.  Pulmonary:     Breath sounds: Rales present.     Comments: Mildly increased work of breathing.  Tachypnea in the 20s.  Hypoxic to 87% on RA. Abdominal:     General: Abdomen is flat. There is no distension.     Palpations: Abdomen is soft.  Musculoskeletal:     Cervical back: Normal range of motion.     Right lower leg: No edema.     Left lower leg: No edema.  Skin:    General: Skin is dry.     Capillary Refill: Capillary refill takes less than 2 seconds.  Neurological:     General: No focal deficit present.     Mental Status: She is alert and oriented to person, place, and time.     GCS: GCS eye subscore is 4. GCS verbal subscore is 5. GCS motor subscore is 6.  Psychiatric:        Mood  and Affect: Mood normal.        Behavior: Behavior normal.        Thought Content: Thought content normal.     ED Results / Procedures / Treatments   Labs (all labs ordered are listed, but only abnormal results are displayed) Labs Reviewed  COMPREHENSIVE METABOLIC PANEL - Abnormal; Notable for the following components:      Result Value   Glucose, Bld 282 (*)    Creatinine, Ser 1.61 (*)    Calcium 8.4 (*)    Albumin 3.3 (*)    AST 99 (*)    ALT 100 (*)    GFR, Estimated 33 (*)    All other components within normal limits  URINALYSIS, COMPLETE (UACMP) WITH MICROSCOPIC - Abnormal; Notable for the following components:   APPearance HAZY (*)    Bacteria, UA RARE (*)    All  other components within normal limits  LACTIC ACID, PLASMA - Abnormal; Notable for the following components:   Lactic Acid, Venous 3.3 (*)    All other components within normal limits  D-DIMER, QUANTITATIVE (NOT AT [facility]) - Abnormal; Notable for the following components:   D-Dimer, Quant 1.12 (*)    All other components within normal limits  LACTATE DEHYDROGENASE - Abnormal; Notable for the following components:   LDH 315 (*)    All other components within normal limits  FERRITIN - Abnormal; Notable for the following components:   Ferritin 646 (*)    All other components within normal limits  FIBRINOGEN - Abnormal; Notable for the following components:   Fibrinogen 553 (*)    All other components within normal limits  C-REACTIVE PROTEIN - Abnormal; Notable for the following components:   CRP 7.5 (*)    All other components within normal limits  CBG MONITORING, ED - Abnormal; Notable for the following components:   Glucose-Capillary 240 (*)    All other components within normal limits  POC SARS CORONAVIRUS 2 AG -  ED - Abnormal; Notable for the following components:   SARS Coronavirus 2 Ag POSITIVE (*)    All other components within normal limits  CULTURE, BLOOD (ROUTINE X 2)  CULTURE, BLOOD (ROUTINE X 2)  URINE CULTURE  CBC WITH DIFFERENTIAL/PLATELET  PROCALCITONIN  TRIGLYCERIDES  LACTIC ACID, PLASMA  CBG MONITORING, ED    EKG EKG Interpretation  Date/Time:  Monday August 03 2020 10:32:45 EST Ventricular Rate:  115 PR Interval:    QRS Duration: 86 QT Interval:  346 QTC Calculation: 479 R Axis:   33 Text Interpretation: Sinus tachycardia Low voltage, precordial leads Confirmed by Bethann Berkshire [phone]) on 08/03/2020 10:48:46 AM   Radiology DG Chest Portable 1 View  Result Date: 08/03/2020 CLINICAL DATA:  Altered mental status EXAM: PORTABLE CHEST 1 VIEW COMPARISON:  December 17, 2018 FINDINGS: There is airspace opacity in the right upper lobe, right mid lung, and left base  regions. Heart size and pulmonary vascularity are normal. No adenopathy. No bone lesions. IMPRESSION: Airspace opacity at several sites, consistent with multifocal pneumonia. Suspect atypical organism pneumonia. Correlation with COVID-19 status advised. No consolidation evident. Heart size normal.  No adenopathy. Electronically Signed   By: Bretta Bang III M.D.   On: 08/03/2020 10:57    Procedures .Critical Care Performed by: Lorelee New, PA-C Authorized by: Lorelee New, PA-C   Critical care provider statement:    Critical care time (minutes):  45   Critical care was necessary to treat or prevent imminent or life-threatening deterioration  of the following conditions:  Respiratory failure   Critical care was time spent personally by me on the following activities:  Discussions with consultants, evaluation of patient's response to treatment, examination of patient, ordering and performing treatments and interventions, ordering and review of laboratory studies, ordering and review of radiographic studies, pulse oximetry, re-evaluation of patient's condition, obtaining history from patient or surrogate, review of old charts and blood draw for specimens Comments:     COVID-19 hypoxia.    (including critical care time)  Medications Ordered in ED Medications  dexamethasone (DECADRON) injection 8 mg (has no administration in time range)  remdesivir 100 mg in sodium chloride 0.9 % 100 mL IVPB (has no administration in time range)  remdesivir 100 mg in sodium chloride 0.9 % 100 mL IVPB (has no administration in time range)  acetaminophen (TYLENOL) tablet 500 mg (500 mg Oral Given 08/03/20 1226)  sodium chloride 0.9 % bolus 500 mL (500 mLs Intravenous New Bag/Given 08/03/20 1228)    ED Course  I have reviewed the triage vital signs and the nursing notes.  Pertinent labs & imaging results that were available during my care of the patient were reviewed by me and considered in my medical  decision making (see chart for details).  Clinical Course as of 08/03/20 1247  Mon Aug 03, 2020  1247 I spoke with Dr. Sherryll Burger who will see and admit patient for her COVID-19 and new oxygen requirement. [GG]    Clinical Course User Index [GG] Lorelee New, PA-C   MDM Rules/Calculators/A&P                          Patient presents to the ED with nonspecific altered mental status.  She was noted to be borderline SIRS criteria on arrival, so rectal temperature was obtained and she was febrile to 101.3 F.  She was treated with Tylenol.  Plain films of chest revealed multifocal pneumonia with concern for COVID-19.  Point-of-care COVID-19 antigen test was positive.  Laboratory work-up is also suggestive of COVID-19 infection.  This explains her tachypnea, hypoxia, and mildly increased work of breathing.  She does have a history of stroke, not on anticoagulation.  However, D-dimer is unimpressive at 1.12 and given lack of any chest pain, lower suspicion for pulmonary embolism at this time and instead suspect that her oxygen requirement is in the setting of her multifocal pneumonia.  While she does have a lactic acidosis of 3.3, will be cautious with fluid resuscitation.  We will treat with 500 mL IV NS in addition to Decadron and remdesivir.  We will consult hospitalist for admission.  I spoke with Dr. Sherryll Burger who will see and admit patient for her COVID-19 and new oxygen requirement.   Final Clinical Impression(s) / ED Diagnoses Final diagnoses:  Altered mental status, unspecified altered mental status type  Hypoxia  COVID-19    Rx / DC Orders ED Discharge Orders    None       Lorelee New, PA-C 08/03/20 1247    Bethann Berkshire, MD 08/08/20 1352

## 2020-08-03 NOTE — H&P (Addendum)
History and Physical    Amanda Armstrong ESP:[id number] DOB: 09-12-1945 DOA: 08/03/2020  PCP: [name], MD   Patient coming from: Home  Chief Complaint: AMS/dyspnea  HPI: Amanda Armstrong is a 75 y.o. female with medical history significant for prior CVA with hypertension and dyslipidemia, chronic tobacco abuse, chronic pain, and hypothyroidism who was brought to the ED via EMS for worsening confusion as well as dyspnea.  She was recently noted to have a UTI and was prescribed ciprofloxacin and has 3 doses remaining.  She lives at home with her husband and her husband states that she has been increasingly confused for the last 1 week or so.  She was also borderline hypoxemic and had elevated blood sugar readings noted on EMS arrival.  Patient is overall a poor historian but states that she has not had any symptoms of cough, rhinorrhea, fever, chills, chest pain, abdominal pain, nausea, vomiting, or diarrhea.  She states that she has noted some worsening shortness of breath over baseline.  She otherwise denies any sick contacts. No speech slurring, headache, focal weakness, or numbness otherwise noted.  Patient does have chronic pain and takes a significant amount of narcotic medication, but there is no suspicion of overdose.  Patient has had COVID vaccination, but no booster.   ED Course: Patient noted to be febrile with temperature 101.3 F, pulse rate of 108, respirations of 31.  She is currently without any significant respiratory distress and is able to answer questions in full sentences. She is alert and oriented to person place and time.  Currently wearing 2 L nasal cannula oxygen.  Creatinine is 1.6 and lactic acid is 3.3.  COVID antigen testing is positive and chest x-ray notable for multifocal pneumonia.  Urinalysis without any significant findings aside from low bacteria.  Procalcitonin 0.1 and ddimer mildly elevated at 1.1. CT head with no acute findings.  Review of Systems: Reviewed  as noted above, otherwise negative.  Past Medical History:  Diagnosis Date  . Anxiety   . Arthritis    Bilateral Knee DJD  . Chronic pain   . Chronic pain   . Chronically on opiate therapy   . Clostridium difficile carrier    pt stated that she has intermittent sx  . Clostridium difficile infection   . Daytime somnolence   . Frequent falls   . Headache(784.0)    SINUS HEADACHE OCCASSIONALLY  . Hypertension   . Hypothyroidism   . Multifactorial gait disorder   . Osteoporosis   . Right foot drop   . Snoring   . Stroke (HCC)   . Tobacco use   . Vertigo   . Weakness     Past Surgical History:  Procedure Laterality Date  . CATARACT EXTRACTION W/PHACO Right 06/28/2016   Procedure: CATARACT EXTRACTION PHACO AND INTRAOCULAR LENS PLACEMENT (IOC);  Surgeon: Jethro Bolus, MD;  Location: [facility];  Service: Ophthalmology;  Laterality: Right;  CDE: 11.10  . CATARACT EXTRACTION W/PHACO Left 07/26/2016   Procedure: CATARACT EXTRACTION PHACO AND INTRAOCULAR LENS PLACEMENT (IOC);  Surgeon: Jethro Bolus, MD;  Location: [facility];  Service: Ophthalmology;  Laterality: Left;  CDE: 8.88  . JOINT REPLACEMENT  11/08/3010   right total knee  . TOTAL KNEE ARTHROPLASTY  11/07/2011   Procedure: TOTAL KNEE ARTHROPLASTY;  Surgeon: Nilda Simmer, MD;  Location: [facility];  Service: Orthopedics;  Laterality: Left;  DR Thurston Hole WANTS 90 MINUTES FOR THIS CASE  . TOTAL KNEE ARTHROPLASTY Right 2011  . TUBAL LIGATION  1982     reports that she has been smoking cigarettes. She has a 24.00 pack-year smoking history. She has never used smokeless tobacco. She reports that she does not drink alcohol and does not use drugs.  Allergies  Allergen Reactions  . Iron Other (See Comments)    "upset stomach"  . Oxycodone Other (See Comments)    "CX HER TO FELL LIKE SHE IS OUT OF HER BODY"  . Sudafed [Pseudoephedrine Hcl] Other (See Comments)    "funny feeling"    Family History  Problem Relation Age of Onset  . Heart  attack Mother   . COPD Father   . Arthritis Sister   . Heart disease Brother     Prior to Admission medications   Medication Sig Start Date End Date Taking? Authorizing Provider  acetaminophen (TYLENOL) 325 MG tablet Take 2 tablets (650 mg total) by mouth every 6 (six) hours as needed for up to 30 doses for mild pain or moderate pain. 07/15/19   Terald Sleeper, MD  alendronate (FOSAMAX) 70 MG tablet Take 70 mg by mouth every Sunday.  04/05/13   [provider]  ALPRAZolam Prudy Feeler) 1 MG tablet Take 1 tablet (1 mg total) by mouth 3 (three) times daily. Patient taking differently: Take 1 mg by mouth at bedtime.  02/01/16   Erick Blinks, MD  atorvastatin (LIPITOR) 20 MG tablet Take 20 mg by mouth at bedtime. 09/14/15   [provider]  cephALEXin (KEFLEX) 500 MG capsule Take 1 capsule (500 mg total) by mouth 4 (four) times daily. Patient not taking: Reported on 12/17/2018 11/26/18   Bethann Berkshire, MD  famotidine (PEPCID) 20 MG tablet Take 1 tablet (20 mg total) by mouth 2 (two) times daily. 01/11/19   Dartha Lodge, PA-C  furosemide (LASIX) 20 MG tablet Take 20 mg by mouth 2 (two) times daily.     [provider]  ibuprofen (ADVIL) 400 MG tablet Take 1 tablet (400 mg total) by mouth 2 (two) times a day. 01/11/19   Dartha Lodge, PA-C  levothyroxine (SYNTHROID) 100 MCG tablet Take 75 mcg by mouth daily before breakfast.  08/29/15   [provider]  lisinopril (ZESTRIL) 20 MG tablet Take 20 mg by mouth 2 (two) times a day.    [provider]  meclizine (ANTIVERT) 25 MG tablet Take 1 tablet (25 mg total) by mouth 3 (three) times daily as needed for dizziness. 04/13/16   Lavera Guise, MD  methocarbamol (ROBAXIN) 500 MG tablet Take 1 tablet (500 mg total) by mouth 2 (two) times daily. 01/11/19   Dartha Lodge, PA-C  sulfamethoxazole-trimethoprim (BACTRIM) 400-80 MG tablet Take 1 tablet by mouth at bedtime.    [provider]  terbinafine (LAMISIL)  250 MG tablet Take 250 mg by mouth every evening.    [provider]  traZODone (DESYREL) 50 MG tablet Take 50 mg by mouth at bedtime.    [provider]  umeclidinium-vilanterol (ANORO ELLIPTA) 62.5-25 MCG/INH AEPB Inhale 1 puff into the lungs daily.    [provider]    Physical Exam: Vitals:   08/03/20 1130 08/03/20 1200 08/03/20 1215 08/03/20 1230  BP: 113/71 118/71    Pulse: (!) 103  (!) 102 (!) 108  Resp:  (!) 22 (!) 29 (!) 31  Temp:      TempSrc:      SpO2: 99%  95% 97%  Weight:      Height:  Constitutional: NAD, calm, comfortable Vitals:   08/03/20 1130 08/03/20 1200 08/03/20 1215 08/03/20 1230  BP: 113/71 118/71    Pulse: (!) 103  (!) 102 (!) 108  Resp:  (!) 22 (!) 29 (!) 31  Temp:      TempSrc:      SpO2: 99%  95% 97%  Weight:      Height:       Eyes: lids and conjunctivae normal Neck: normal, supple Respiratory: clear to auscultation bilaterally. Normal respiratory effort. No accessory muscle use.  Currently on 2 L nasal cannula oxygen. Cardiovascular: Regular rate and rhythm, no murmurs. Abdomen: no tenderness, no distention. Bowel sounds positive.  Musculoskeletal:  No edema. Skin: no rashes, lesions, ulcers.  Psychiatric: Flat affect  Labs on Admission: I have personally reviewed following labs and imaging studies  CBC: Recent Labs  Lab 08/03/20 1054  WBC 4.2  NEUTROABS 3.0  HGB 13.0  HCT 41.3  MCV 96.0  PLT 162   Basic Metabolic Panel: Recent Labs  Lab 08/03/20 1054  NA 137  K 3.7  CL 102  CO2 25  GLUCOSE 282*  BUN 15  CREATININE 1.61*  CALCIUM 8.4*   GFR: Estimated Creatinine Clearance: 23 mL/min (A) (by C-G formula based on SCr of 1.61 mg/dL (H)). Liver Function Tests: Recent Labs  Lab 08/03/20 1054  AST 99*  ALT 100*  ALKPHOS 49  BILITOT 0.4  PROT 6.7  ALBUMIN 3.3*   No results for input(s): LIPASE, AMYLASE in the last 168 hours. No results for input(s): AMMONIA in the last 168  hours. Coagulation Profile: No results for input(s): INR, PROTIME in the last 168 hours. Cardiac Enzymes: No results for input(s): CKTOTAL, CKMB, CKMBINDEX, TROPONINI in the last 168 hours. BNP (last 3 results) No results for input(s): PROBNP in the last 8760 hours. HbA1C: No results for input(s): HGBA1C in the last 72 hours. CBG: Recent Labs  Lab 08/03/20 1148  GLUCAP 240*   Lipid Profile: Recent Labs    08/03/20 1103  TRIG 103   Thyroid Function Tests: No results for input(s): TSH, T4TOTAL, FREET4, T3FREE, THYROIDAB in the last 72 hours. Anemia Panel: Recent Labs    08/03/20 1103  FERRITIN 646*   Urine analysis:    Component Value Date/Time   COLORURINE YELLOW 08/03/2020 1137   APPEARANCEUR HAZY (A) 08/03/2020 1137   LABSPEC 1.015 08/03/2020 1137   PHURINE 5.0 08/03/2020 1137   GLUCOSEU NEGATIVE 08/03/2020 1137   HGBUR NEGATIVE 08/03/2020 1137   BILIRUBINUR NEGATIVE 08/03/2020 1137   KETONESUR NEGATIVE 08/03/2020 1137   PROTEINUR NEGATIVE 08/03/2020 1137   UROBILINOGEN 0.2 08/27/2013 1130   NITRITE NEGATIVE 08/03/2020 1137   LEUKOCYTESUR NEGATIVE 08/03/2020 1137    Radiological Exams on Admission: DG Chest Portable 1 View  Result Date: 08/03/2020 CLINICAL DATA:  Altered mental status EXAM: PORTABLE CHEST 1 VIEW COMPARISON:  December 17, 2018 FINDINGS: There is airspace opacity in the right upper lobe, right mid lung, and left base regions. Heart size and pulmonary vascularity are normal. No adenopathy. No bone lesions. IMPRESSION: Airspace opacity at several sites, consistent with multifocal pneumonia. Suspect atypical organism pneumonia. Correlation with COVID-19 status advised. No consolidation evident. Heart size normal.  No adenopathy. Electronically Signed   By: Bretta BangWilliam  Woodruff III M.D.   On: 08/03/2020 10:57    EKG: Independently reviewed. ST 115bpm.  Assessment/Plan Active Problems:   Acute hypoxemic respiratory failure due to COVID-19 [facility](HCC)    Acute  hypoxemic respiratory failure secondary to COVID-19  pneumonia -Currently on 2 L nasal cannula oxygen with saturation in the high 80th percentile on arrival -Multifocal pneumonia noted on chest x-ray -Patient previously vaccinated, but no booster -Monitor inflammatory markers -Continue remdesivir -Start on Solu-Medrol starting this evening; decadron given in ED -Contact precautions  SIRS criteria with lactic acidosis -Likely related to infection above -Procalcitonin 0.1 -UA without any signs of UTI -Get blood cultures x2  -Chest x-ray with multifocal pneumonia likely related to above -D-dimer 1.1, but low suspicion of PE at this time -Continue IV fluid hydration, avoid antibiotics -Monitor vital signs and repeat lactic acid in a.m.  AKI on CKD 3a secondary to above -Current creatinine 1.6 and baseline appears to be near 1.1-1.3 -Hold home NSAID, ACE-I, and lasix  -IV fluid hydration -Monitor repeat labs -Avoid nephrotoxic agents -Monitor I's and O's  Acute encephalopathy  -Likely multifactorial in setting of recent UTI, polypharmacy, as well as COVID-pneumonia -Currently, appears to be at baseline -Head CT with no acute findings -PT eval   Recent UTI -Does not appear to have any signs of UTI on UA, no symptoms -Hold further antibiotics  Elevated blood glucose -Monitor carefully while on steroids and maintain on SSI -Check hemoglobin A1c  History of CVA/hypertension/dyslipidemia -Residual left upper extremity weakness -Continue home medications with exception of lisinopril due to AKI  History of hypothyroidism -Continue Synthroid -Check TSH  Chronic pain -Continue usual home medications  History of ongoing tobacco abuse -Suspect possible COPD due to many years of smoking -Counseled on cessation -Nicotine patch will be offered -Continue Solu-Medrol and breathing treatments as needed, no acute bronchospasms currently noted  DVT prophylaxis: Heparin Code Status:  DNR Family Communication: Discussed with husband on phone 1/17 Disposition Plan:Admit for COVID treatment and IVF hydration Consults called:None Admission status: Inpatient, Tele  Severity of Illness: The appropriate patient status for this patient is INPATIENT. Inpatient status is judged to be reasonable and necessary in order to provide the required intensity of service to ensure the patient's safety. The patient's presenting symptoms, physical exam findings, and initial radiographic and laboratory data in the context of their chronic comorbidities is felt to place them at high risk for further clinical deterioration. Furthermore, it is not anticipated that the patient will be medically stable for discharge from the hospital within 2 midnights of admission. The following factors support the patient status of inpatient.   " The patient's presenting symptoms include AMS/weakness " The worrisome physical exam findings include hypoxemia " The initial radiographic and laboratory data are worrisome because of COVID positive, and lactic acidosis. " The chronic co-morbidities include CVA, chronic pain.   * I certify that at the point of admission it is my clinical judgment that the patient will require inpatient hospital care spanning beyond 2 midnights from the point of admission due to high intensity of service, high risk for further deterioration and high frequency of surveillance required.*    Yaslin Kirtley D Jacaden Forbush DO [facility]  If 7PM-7AM, please contact night-coverage [email or web address]  08/03/2020, 1:12 PM

## 2020-08-03 NOTE — ED Notes (Signed)
Date and time results received: 08/03/20 1154   Test:Lactic Acid Critical Value: 3.3  Name of Provider Notified: Chilton Si  Orders Received? Or Actions Taken?: Sepsis Orders

## 2020-08-03 NOTE — ED Notes (Signed)
Updated daughter Amanda Armstrong

## 2020-08-04 DIAGNOSIS — Z720 Tobacco use: Secondary | ICD-10-CM

## 2020-08-04 DIAGNOSIS — F17210 Nicotine dependence, cigarettes, uncomplicated: Secondary | ICD-10-CM

## 2020-08-04 DIAGNOSIS — G9341 Metabolic encephalopathy: Secondary | ICD-10-CM

## 2020-08-04 DIAGNOSIS — N179 Acute kidney failure, unspecified: Secondary | ICD-10-CM

## 2020-08-04 DIAGNOSIS — N1832 Chronic kidney disease, stage 3b: Secondary | ICD-10-CM

## 2020-08-04 LAB — GLUCOSE, CAPILLARY
Glucose-Capillary: 235 mg/dL — ABNORMAL HIGH (ref 70–99)
Glucose-Capillary: 325 mg/dL — ABNORMAL HIGH (ref 70–99)
Glucose-Capillary: 306 mg/dL — ABNORMAL HIGH (ref 70–99)
Glucose-Capillary: 279 mg/dL — ABNORMAL HIGH (ref 70–99)
Glucose-Capillary: 245 mg/dL — ABNORMAL HIGH (ref 70–99)

## 2020-08-04 LAB — CBC WITH DIFFERENTIAL/PLATELET
Abs Immature Granulocytes: 0.02 10*3/uL (ref 0.00–0.07)
Basophils Absolute: 0 10*3/uL (ref 0.0–0.1)
Basophils Relative: 0 %
Eosinophils Absolute: 0 10*3/uL (ref 0.0–0.5)
Eosinophils Relative: 0 %
HCT: 37.3 % (ref 36.0–46.0)
Hemoglobin: 12.1 g/dL (ref 12.0–15.0)
Immature Granulocytes: 1 %
Lymphocytes Relative: 18 %
Lymphs Abs: 0.7 10*3/uL (ref 0.7–4.0)
MCH: 30.6 pg (ref 26.0–34.0)
MCHC: 32.4 g/dL (ref 30.0–36.0)
MCV: 94.2 fL (ref 80.0–100.0)
Monocytes Absolute: 0.2 10*3/uL (ref 0.1–1.0)
Monocytes Relative: 5 %
Neutro Abs: 2.9 10*3/uL (ref 1.7–7.7)
Neutrophils Relative %: 76 %
Platelets: 158 10*3/uL (ref 150–400)
RBC: 3.96 MIL/uL (ref 3.87–5.11)
RDW: 14.4 % (ref 11.5–15.5)
WBC: 3.8 10*3/uL — ABNORMAL LOW (ref 4.0–10.5)
nRBC: 0 % (ref 0.0–0.2)

## 2020-08-04 LAB — URINE CULTURE: Culture: NO GROWTH

## 2020-08-04 LAB — HEPATITIS B SURFACE ANTIGEN: Hepatitis B Surface Ag: NONREACTIVE

## 2020-08-04 LAB — D-DIMER, QUANTITATIVE: D-Dimer, Quant: 0.85 ug/mL-FEU — ABNORMAL HIGH (ref 0.00–0.50)

## 2020-08-04 LAB — FOLATE: Folate: 11.9 ng/mL (ref >5.9)

## 2020-08-04 LAB — COMPREHENSIVE METABOLIC PANEL
ALT: 80 U/L — ABNORMAL HIGH (ref 0–44)
AST: 65 U/L — ABNORMAL HIGH (ref 15–41)
Albumin: 2.7 g/dL — ABNORMAL LOW (ref 3.5–5.0)
Alkaline Phosphatase: 43 U/L (ref 38–126)
Anion gap: 9 (ref 5–15)
BUN: 20 mg/dL (ref 8–23)
CO2: 23 mmol/L (ref 22–32)
Calcium: 7.9 mg/dL — ABNORMAL LOW (ref 8.9–10.3)
Chloride: 103 mmol/L (ref 98–111)
Creatinine, Ser: 1.3 mg/dL — ABNORMAL HIGH (ref 0.44–1.00)
GFR, Estimated: 43 mL/min — ABNORMAL LOW (ref >60)
Glucose, Bld: 366 mg/dL — ABNORMAL HIGH (ref 70–99)
Potassium: 4 mmol/L (ref 3.5–5.1)
Sodium: 135 mmol/L (ref 135–145)
Total Bilirubin: 0.2 mg/dL — ABNORMAL LOW (ref 0.3–1.2)
Total Protein: 5.8 g/dL — ABNORMAL LOW (ref 6.5–8.1)

## 2020-08-04 LAB — HEPATITIS C ANTIBODY: HCV Ab: NONREACTIVE

## 2020-08-04 LAB — LACTIC ACID, PLASMA: Lactic Acid, Venous: 2 mmol/L (ref 0.5–1.9)

## 2020-08-04 LAB — FERRITIN: Ferritin: 690 ng/mL — ABNORMAL HIGH (ref 11–307)

## 2020-08-04 LAB — VITAMIN B12: Vitamin B-12: 299 pg/mL (ref 180–914)

## 2020-08-04 LAB — C-REACTIVE PROTEIN: CRP: 8.3 mg/dL — ABNORMAL HIGH (ref <1.0)

## 2020-08-04 LAB — MAGNESIUM: Magnesium: 1.9 mg/dL (ref 1.7–2.4)

## 2020-08-04 MED ORDER — LACTATED RINGERS IV BOLUS
1000.0000 mL | Freq: Once | INTRAVENOUS | Status: AC
  Administered 2020-08-04: 1000 mL via INTRAVENOUS

## 2020-08-04 MED ORDER — LEVALBUTEROL TARTRATE 45 MCG/ACT IN AERO
2.0000 | INHALATION_SPRAY | Freq: Four times a day (QID) | RESPIRATORY_TRACT | Status: DC
  Administered 2020-08-04 (×2): 2 via RESPIRATORY_TRACT
  Filled 2020-08-04: qty 15

## 2020-08-04 MED ORDER — LEVALBUTEROL TARTRATE 45 MCG/ACT IN AERO
2.0000 | INHALATION_SPRAY | Freq: Four times a day (QID) | RESPIRATORY_TRACT | Status: DC
  Administered 2020-08-04: 2 via RESPIRATORY_TRACT
  Filled 2020-08-04 (×2): qty 15

## 2020-08-04 NOTE — Progress Notes (Signed)
CRITICAL VALUE ALERT  Critical Value:  Lactic Acid 2.0  Date & Time Notied:  08/04/2020 7:23  Provider Notified: TAT, MD  Orders Received/Actions taken:

## 2020-08-04 NOTE — Progress Notes (Signed)
Inpatient Diabetes Program Recommendations  AACE/ADA: New Consensus Statement on Inpatient Glycemic Control   Target Ranges:  Prepandial:   less than 140 mg/dL      Peak postprandial:   less than 180 mg/dL (1-2 hours)      Critically ill patients:  140 - 180 mg/dL  Results for CIEARRA, RUFO (MRN [id number]) as of 08/04/2020 10:20  Ref. Range 08/03/2020 11:48 08/03/2020 17:33 08/04/2020 07:28  Glucose-Capillary Latest Ref Range: 70 - 99 mg/dL 578 (H) 469 (H) 629 (H)   Results for ZENIA, GUEST (MRN [id number]) as of 08/04/2020 10:20  Ref. Range 05/27/2013 13:46 08/03/2020 13:28  Hemoglobin A1C Latest Ref Range: 4.8 - 5.6 % 5.6 6.7 (H)   Review of Glycemic Control  Diabetes history: No Outpatient Diabetes medications: NA Current orders for Inpatient glycemic control: Novolog 0-9 units TID with meals, Novolog 0-5 units QHS; Solumedrol 30.8 kg x Q12H  Inpatient Diabetes Program Recommendations:    Insulin: If steroids are continued, please consider ordering Lantus 10 units Q24H. If post prandial glucose remains consistently over 180 mg/dl, please consider ordeirng Novolog 3 units TID with meals for meal coverage if patient eats at least 50% of meals.  HbgA1C: A1C 6.7% on 08/03/20 indicating an average glucose of 146 mg/dl over the past 2-3 months. Note by Dr. Arbutus Leas today notes no history of DM. If patient is being newly dx with DM, please inform patient, inform bedside nursing so they can begin education, and consult inpatient diabetes coordinator.  Thanks, Orlando Penner, RN, MSN, CDE Diabetes Coordinator Inpatient Diabetes Program [phone] (Team Pager from 8am to 5pm)

## 2020-08-04 NOTE — Evaluation (Signed)
Physical Therapy Evaluation Patient Details Name: Amanda Armstrong MRN: [id number] DOB: 25-Feb-1946 Today's Date: 08/04/2020   History of Present Illness  Amanda Armstrong is a 75 y.o. female with medical history significant for prior CVA with hypertension and dyslipidemia, chronic tobacco abuse, chronic pain, and hypothyroidism who was brought to the ED via EMS for worsening confusion as well as dyspnea.  She was recently noted to have a UTI and was prescribed ciprofloxacin and has 3 doses remaining.  She lives at home with her husband and her husband states that she has been increasingly confused for the last 1 week or so.  She was also borderline hypoxemic and had elevated blood sugar readings noted on EMS arrival.  Patient is overall a poor historian but states that she has not had any symptoms of cough, rhinorrhea, fever, chills, chest pain, abdominal pain, nausea, vomiting, or diarrhea.  She states that she has noted some worsening shortness of breath over baseline.  She otherwise denies any sick contacts. No speech slurring, headache, focal weakness, or numbness otherwise noted.  Patient does have chronic pain and takes a significant amount of narcotic medication, but there is no suspicion of overdose.  Patient has had COVID vaccination, but no booster.    Clinical Impression  Patient requires Mod/max assist for sitting up at bedside having most difficulty scooting to EOB due to generalized weakness, limited to a few side steps at bedside due to BLE weakness, fall risk and poor standing balance.  Patient tolerated sitting up in chair after therapy.  Patient SpO2 dropped from 92% to 89% on room air and put back on 2 LPM after therapy - RN notified.  Patient will benefit from continued physical therapy in hospital and recommended venue below to increase strength, balance, endurance for safe ADLs and gait.     Follow Up Recommendations SNF    Equipment Recommendations  None recommended by PT     Recommendations for Other Services       Precautions / Restrictions Precautions Precautions: Fall Restrictions Weight Bearing Restrictions: No      Mobility  Bed Mobility Overal bed mobility: Needs Assistance Bed Mobility: Supine to Sit     Supine to sit: Mod assist;Max assist     General bed mobility comments: increased time labored movement, most difficulty scooting to EOB    Transfers Overall transfer level: Needs assistance Equipment used: Rolling walker (2 wheeled) Transfers: Sit to/from [employer] Sit to Stand: Mod assist Stand pivot transfers: Mod assist       General transfer comment: slow labored movement  Ambulation/Gait Ambulation/Gait assistance: Mod assist;Max assist Gait Distance (Feet): 4 Feet Assistive device: Rolling walker (2 wheeled) Gait Pattern/deviations: Decreased step length - right;Decreased step length - left;Decreased stride length Gait velocity: decreased   General Gait Details: limited to 4-5 slow labored unsteady side steps due to BLE weakness, fall risk  Stairs            Wheelchair Mobility    Modified Rankin (Stroke Patients Only)       Balance Overall balance assessment: Needs assistance Sitting-balance support: Feet supported;No upper extremity supported Sitting balance-Leahy Scale: Fair Sitting balance - Comments: fair/good seated at EOB   Standing balance support: During functional activity;Bilateral upper extremity supported Standing balance-Leahy Scale: Poor Standing balance comment: using RW                             Pertinent Vitals/Pain  Pain Assessment: No/denies pain    Home Living Family/patient expects to be discharged to:: Private residence Living Arrangements: Spouse/significant other Available Help at Discharge: Family;Available 24 hours/day Type of Home: House Home Access: Ramped entrance     Home Layout: One level Home Equipment: Walker - 2  wheels;Wheelchair - manual;Grab bars - tub/shower;Bedside commode      Prior Function Level of Independence: Independent with assistive device(s)         Comments: household ambulator using RW     Hand Dominance   Dominant Hand: Right    Extremity/Trunk Assessment   Upper Extremity Assessment Upper Extremity Assessment: Generalized weakness    Lower Extremity Assessment Lower Extremity Assessment: Generalized weakness    Cervical / Trunk Assessment Cervical / Trunk Assessment: Normal  Communication   Communication: No difficulties  Cognition Arousal/Alertness: Awake/alert Behavior During Therapy: WFL for tasks assessed/performed Overall Cognitive Status: Within Functional Limits for tasks assessed                                        General Comments      Exercises     Assessment/Plan    PT Assessment Patient needs continued PT services  PT Problem List Decreased strength;Decreased activity tolerance;Decreased balance;Decreased mobility       PT Treatment Interventions DME instruction;Gait training;Stair training;Therapeutic activities;Therapeutic exercise;Balance training;Patient/family education;Functional mobility training    PT Goals (Current goals can be found in the Care Plan section)  Acute Rehab PT Goals Patient Stated Goal: return home after rehab PT Goal Formulation: With patient Time For Goal Achievement: 09/08/20 Potential to Achieve Goals: Good    Frequency Min 3X/week   Barriers to discharge        Co-evaluation               AM-PAC PT "6 Clicks" Mobility  Outcome Measure Help needed turning from your back to your side while in a flat bed without using bedrails?: A Lot Help needed moving from lying on your back to sitting on the side of a flat bed without using bedrails?: A Lot Help needed moving to and from a bed to a chair (including a wheelchair)?: A Lot Help needed standing up from a chair using your arms  (e.g., wheelchair or bedside chair)?: A Lot Help needed to walk in hospital room?: A Lot Help needed climbing 3-5 steps with a railing? : Total 6 Click Score: 11    End of Session Equipment Utilized During Treatment: Oxygen Activity Tolerance: Patient tolerated treatment well;Patient limited by fatigue Patient left: in chair;with call bell/phone within reach Nurse Communication: Mobility status PT Visit Diagnosis: Unsteadiness on feet (R26.81);Other abnormalities of gait and mobility (R26.89);Muscle weakness (generalized) (M62.81)    Time: 4259-5638 PT Time Calculation (min) (ACUTE ONLY): 29 min   Charges:   PT Evaluation $PT Eval Moderate Complexity: 1 Mod PT Treatments $Therapeutic Activity: 23-37 mins        2:02 PM, 08/04/20 Ocie Bob, MPT Physical Therapist with [facility] [address] [phone] office [phone] mobile phone

## 2020-08-04 NOTE — Progress Notes (Signed)
Pt refused breakfast tray, states, "I just ain't hungry". Did drink cup of apple juice and sipping on H20. IV fluid bolus continues without diff or s/s infiltration. Pt denies any other c/o.

## 2020-08-04 NOTE — Progress Notes (Signed)
   08/03/20 2019  Assess: MEWS Score  Temp 97.8 F (36.6 C)  BP (!) 98/52  Pulse Rate 85  Resp (!) 24  Level of Consciousness Alert  SpO2 93 %  O2 Device Nasal Cannula  O2 Flow Rate (L/min) 1.5 L/min  Assess: MEWS Score  MEWS Temp 0  MEWS Systolic 1  MEWS Pulse 0  MEWS RR 1  MEWS LOC 0  MEWS Score 2  MEWS Score Color Yellow  Assess: if the MEWS score is Yellow or Red  Were vital signs taken at a resting state? Yes  Focused Assessment Change from prior assessment (see assessment flowsheet)  Early Detection of Sepsis Score *See Row Information* Medium  MEWS guidelines implemented *See Row Information* No, previously yellow, continue vital signs every 4 hours (had been mostly yellow in ED)  Treat  MEWS Interventions Administered scheduled meds/treatments  Pain Scale 0-10  Take Vital Signs  Increase Vital Sign Frequency  Yellow: Q 2hr X 2 then Q 4hr X 2, if remains yellow, continue Q 4hrs  Escalate  MEWS: Escalate Yellow: discuss with charge nurse/RN and consider discussing with provider and RRT  Notify: Charge Nurse/RN  Name of Charge Nurse/RN Notified Linwood Dibbles, RN  Date Charge Nurse/RN Notified 08/04/20  Time Charge Nurse/RN Notified 0045  Document  Patient Outcome Other (Comment) (O2 sat in low 90's on 2 liters)

## 2020-08-04 NOTE — Plan of Care (Signed)
  Problem: Acute Rehab PT Goals(only PT should resolve) Goal: Pt Will Go Supine/Side To Sit Outcome: Progressing Flowsheets (Taken 08/04/2020 1403) Pt will go Supine/Side to Sit: with moderate assist Goal: Patient Will Transfer Sit To/From Stand Outcome: Progressing Flowsheets (Taken 08/04/2020 1403) Patient will transfer sit to/from stand:  with minimal assist  with moderate assist Goal: Pt Will Transfer Bed To Chair/Chair To Bed Outcome: Progressing Flowsheets (Taken 08/04/2020 1403) Pt will Transfer Bed to Chair/Chair to Bed:  with mod assist  with min assist Goal: Pt Will Ambulate Outcome: Progressing Flowsheets (Taken 08/04/2020 1403) Pt will Ambulate:  25 feet  with minimal assist  with moderate assist  with rolling walker   2:04 PM, 08/04/20 Ocie Bob, MPT Physical Therapist with [facility] [phone] office [phone] mobile phone

## 2020-08-04 NOTE — Progress Notes (Addendum)
PROGRESS NOTE  Amanda Armstrong VQX:[id number] DOB: 05/30/1946 DOA: 08/03/2020 PCP: [name], MD  Brief History:  75 year old female with a history of stroke with left hemiparesis, hypertension, hyperlipidemia, tobacco abuse, hypothyroidism, and chronic pain presenting with confusion and shortness of breath as well as generalized weakness for about a week.  Unfortunately, the patient is a poor historian.  History is obtained from review of the medical record and speaking with the patient and spouse.  Apparently, the patient has chronic coughing and shortness of breath secondary to her prolonged tobacco use.  But is a bit unclear even after speaking with the patient and spouse whether her pulmonary symptoms had significantly worsened.  Nevertheless, the patient has had some subjective fevers and chills without any nausea, vomiting, diarrhea, hemoptysis, hematochezia, melena.  The patient was prescribed a course of ciprofloxacin by her PCP on 07/30/2020 for 7-day course.  This did not seem to help the patient's confusion.  The patient herself denies any chest pain, headache, sore throat, worsening cough or shortness of breath.  She has had some intermittent abdominal pain, but states that her abdominal pain is improved presently. In the emergency department, the patient was febrile to 101.3 F.  The patient was hypoxic with saturation of oxygen at 88% on room air.  WBC 4.2, hemoglobin 13.0, platelets 162,000.  BMP showed a serum creatinine of 1.61 which is above her usual baseline.  AST 99, ALT 10, alkaline phosphatase 49, total bilirubin 0.4.  The patient was started on steroids and remdesivir.  Assessment/Plan: Acute respiratory failure with hypoxia secondary to COVID-19 -Stable on 2 L presently -CRP 7.5 -Ferritin 646 -D-dimer 1.12 -Prone positioning as much as tolerated -Vitamin C and zinc -Incentive spirometry -personally reviewed CXR-- patchy bilateral infiltrates -continue  solumedrol and remdesivir  SIRS -Sepsis ruled out -This is likely secondary to the patient's COVID infection, hypoxia and volume depletion -She presented with fever and tachycardia -Blood cultures remain negative -PCT 0.10 -UA 6-10 WBC  Acute metabolic encephalopathy -Multifactorial including infectious process, hypoxia, acute on chronic renal failure, and possibly effects from her benzodiazepine and opiates -TSH 0.440 -B12 -folate -UA 6-10WBC  Chronic opioid and benzodiazepine use -PMP aware reviewed--patient receives alprazolam # 30 and Norco 10 mg #120 on a monthly basis from one provider -Monitor use judiciously  Lactic acidosis -Primarily due to hypoxia and volume depletion -Continue IV fluids -Follow blood cultures  Acute on chronic renal failure--CKD stage IIIb -Baseline creatinine 1.1-1.3 -Presented with serum creatinine 1.61 -Continue IV fluids -Holding furosemide and lisinopril  Hyperglycemia/Diabetes Mellitus type 2 -No history of diabetes mellitus -08/03/20 hemoglobin A1c--6.7 -NovoLog sliding scale  Tobacco abuse with presumptive COPD -Patient has nearly 50-pack-year history -Currently smoking 1/2 pack/day -Tobacco cessation discussed -nicoderm patch -Continue Anoro  Essential hypertension -Holding lisinopril secondary to acute kidney injury and soft blood pressure  Hyperlipidemia -hold statin until LFTs improved  Hypothyroidism -Continue Synthroid -TSH--0.440  Transaminasemia -due to COVID -check hep B surface antigen--neg -check hep C antibody--neg -trend LFTs -hold statin      Status is: Inpatient  Remains inpatient appropriate because:IV treatments appropriate due to intensity of illness or inability to take PO   Dispo: The patient is from: Home              Anticipated d/c is to: Home              Anticipated d/c date is: 1 day  Patient currently is not medically stable to d/c.        Family Communication:    Spouse and daughter updated 1/18  Consultants:  none  Code Status:  DNR  DVT Prophylaxis:  [place] Heparin   Procedures: As Listed in Progress Note Above  Antibiotics: None       Subjective: Patient denies fevers, chills, headache, chest pain, dyspnea, nausea, vomiting, diarrhea, abdominal pain, dysuria,    Objective: Vitals:   08/04/20 0046 08/04/20 0538 08/04/20 0542 08/04/20 0708  BP: 104/64  109/62   Pulse: 67  72   Resp: 18  (!) 24   Temp: 98.1 F (36.7 C)  97.8 F (36.6 C)   TempSrc: Oral  Oral   SpO2: 91% (!) 89% 91% 92%  Weight:      Height:        Intake/Output Summary (Last 24 hours) at 08/04/2020 0076 Last data filed at 08/04/2020 0600 Gross per 24 hour  Intake 1580.65 ml  Output 250 ml  Net 1330.65 ml   Weight change:  Exam:   General:  Pt is alert, follows commands appropriately, not in acute distress  HEENT: No icterus, No thrush, No neck mass, [place]/AT  Cardiovascular: RRR, S1/S2, no rubs, no gallops  Respiratory: bilateral rales. No wheeze  Abdomen: Soft/+BS, non tender, non distended, no guarding  Extremities: No edema, No lymphangitis, No petechiae, No rashes, no synovitis   Data Reviewed: I have personally reviewed following labs and imaging studies Basic Metabolic Panel: Recent Labs  Lab 08/03/20 1054 08/04/20 0523  NA 137 135  K 3.7 4.0  CL 102 103  CO2 25 23  GLUCOSE 282* 366*  BUN 15 20  CREATININE 1.61* 1.30*  CALCIUM 8.4* 7.9*  MG  --  1.9   Liver Function Tests: Recent Labs  Lab 08/03/20 1054 08/04/20 0523  AST 99* 65*  ALT 100* 80*  ALKPHOS 49 43  BILITOT 0.4 0.2*  PROT 6.7 5.8*  ALBUMIN 3.3* 2.7*   No results for input(s): LIPASE, AMYLASE in the last 168 hours. No results for input(s): AMMONIA in the last 168 hours. Coagulation Profile: No results for input(s): INR, PROTIME in the last 168 hours. CBC: Recent Labs  Lab 08/03/20 1054 08/04/20 0523  WBC 4.2 3.8*  NEUTROABS 3.0 2.9  HGB 13.0 12.1   HCT 41.3 37.3  MCV 96.0 94.2  PLT 162 158   Cardiac Enzymes: No results for input(s): CKTOTAL, CKMB, CKMBINDEX, TROPONINI in the last 168 hours. BNP: Invalid input(s): POCBNP CBG: Recent Labs  Lab 08/03/20 1148 08/03/20 1733 08/04/20 0728  GLUCAP 240* 236* 325*   HbA1C: Recent Labs    08/03/20 1328  HGBA1C 6.7*   Urine analysis:    Component Value Date/Time   COLORURINE YELLOW 08/03/2020 1137   APPEARANCEUR HAZY (A) 08/03/2020 1137   LABSPEC 1.015 08/03/2020 1137   PHURINE 5.0 08/03/2020 1137   GLUCOSEU NEGATIVE 08/03/2020 1137   HGBUR NEGATIVE 08/03/2020 1137   BILIRUBINUR NEGATIVE 08/03/2020 1137   KETONESUR NEGATIVE 08/03/2020 1137   PROTEINUR NEGATIVE 08/03/2020 1137   UROBILINOGEN 0.2 08/27/2013 1130   NITRITE NEGATIVE 08/03/2020 1137   LEUKOCYTESUR NEGATIVE 08/03/2020 1137   Sepsis Labs: @LABRCNTIP (procalcitonin:4,lacticidven:4) ) Recent Results (from the past 240 hour(s))  Blood Culture (routine x 2)     Status: None (Preliminary result)   Collection Time: 08/03/20 11:03 AM   Specimen: BLOOD RIGHT HAND  Result Value Ref Range Status   Specimen Description BLOOD RIGHT HAND  Final  Special Requests   Final    BOTTLES DRAWN AEROBIC AND ANAEROBIC Blood Culture results may not be optimal due to an inadequate volume of blood received in culture bottles   Culture   Final    NO GROWTH < 24 HOURS Performed at [facility], [address] [place], [place] [address]    Report Status PENDING  Incomplete  Blood Culture (routine x 2)     Status: None (Preliminary result)   Collection Time: 08/03/20 11:35 AM   Specimen: BLOOD LEFT HAND  Result Value Ref Range Status   Specimen Description BLOOD LEFT HAND  Final   Special Requests   Final    BOTTLES DRAWN AEROBIC AND ANAEROBIC Blood Culture results may not be optimal due to an inadequate volume of blood received in culture bottles   Culture   Final    NO GROWTH < 24 HOURS Performed at [facility], [address] [place], [place] [address]    Report Status PENDING  Incomplete     Scheduled Meds: . ALPRAZolam  1 mg Oral QHS  . vitamin C  500 mg Oral Daily  . atorvastatin  20 mg Oral QHS  . famotidine  20 mg Oral Daily  . heparin  5,000 Units Subcutaneous Q8H  . insulin aspart  0-5 Units Subcutaneous QHS  . insulin aspart  0-9 Units Subcutaneous TID WC  . levalbuterol  2 puff Inhalation Q6H  . levothyroxine  75 mcg Oral QAC breakfast  . methylPREDNISolone (SOLU-MEDROL) injection  0.5 mg/kg Intravenous Q12H   Followed by  . [START ON 08/06/2020] predniSONE  50 mg Oral Daily  . nicotine  7 mg Transdermal Daily  . terbinafine  250 mg Oral QPM  . umeclidinium-vilanterol  1 puff Inhalation Daily  . zinc sulfate  220 mg Oral Daily   Continuous Infusions: . lactated ringers 75 mL/hr at 08/03/20 2113  . remdesivir 100 mg in NS 100 mL      Procedures/Studies: CT Head Wo Contrast  Result Date: 08/03/2020 CLINICAL DATA:  Altered mental status EXAM: CT HEAD WITHOUT CONTRAST TECHNIQUE: Contiguous axial images were obtained from the base of the skull through the vertex without intravenous contrast. COMPARISON:  July 29, 2018 head CT; July 27, 2018 brain MRI FINDINGS: Brain: Stable changes of atrophy with ventricles relatively larger than sulci in proportion, stable. There is no intracranial mass, hemorrhage, extra-axial fluid collection, or midline shift. Patchy small vessel disease in the centra semiovale bilaterally is stable. There is evidence of a prior infarct in the head of the caudate nucleus on the right. No acute appearing infarct evident. Vascular: No hyperdense vessel. Calcification noted in each carotid siphon region. Skull: Bony calvarium appears intact. Sinuses/Orbits: There is mucosal thickening in several ethmoid air cells. Other visualized paranasal sinuses are clear. Orbits appear symmetric bilaterally. Other: There are opacified mastoid air cells on the right inferiorly. Mastoids  elsewhere are clear. IMPRESSION: Stable atrophy with questionable superimposed degree of normal pressure hydrocephalus. There is patchy small vessel disease in the centra semiovale bilaterally, stable. There is a prior lacunar type infarct in the head of the caudate nucleus on the right. No acute infarct evident. No mass or hemorrhage. There are foci of arterial vascular calcification. There is mucosal thickening in several ethmoid air cells. There is mastoid air cell disease on the right inferiorly. Electronically Signed   By: Bretta BangWilliam  Woodruff III M.D.   On: 08/03/2020 13:17   DG Chest Portable 1 View  Result Date:  08/03/2020 CLINICAL DATA:  Altered mental status EXAM: PORTABLE CHEST 1 VIEW COMPARISON:  December 17, 2018 FINDINGS: There is airspace opacity in the right upper lobe, right mid lung, and left base regions. Heart size and pulmonary vascularity are normal. No adenopathy. No bone lesions. IMPRESSION: Airspace opacity at several sites, consistent with multifocal pneumonia. Suspect atypical organism pneumonia. Correlation with COVID-19 status advised. No consolidation evident. Heart size normal.  No adenopathy. Electronically Signed   By: Bretta Bang III M.D.   On: 08/03/2020 10:57    Catarina Hartshorn, DO  [facility]  If 7PM-7AM, please contact night-coverage [email or web address] Password [email or web address] 08/04/2020, 8:12 AM   LOS: 1 day

## 2020-08-05 LAB — COMPREHENSIVE METABOLIC PANEL
ALT: 67 U/L — ABNORMAL HIGH (ref 0–44)
AST: 45 U/L — ABNORMAL HIGH (ref 15–41)
Albumin: 2.8 g/dL — ABNORMAL LOW (ref 3.5–5.0)
Alkaline Phosphatase: 42 U/L (ref 38–126)
Anion gap: 8 (ref 5–15)
BUN: 21 mg/dL (ref 8–23)
CO2: 26 mmol/L (ref 22–32)
Calcium: 8.2 mg/dL — ABNORMAL LOW (ref 8.9–10.3)
Chloride: 105 mmol/L (ref 98–111)
Creatinine, Ser: 1.15 mg/dL — ABNORMAL HIGH (ref 0.44–1.00)
GFR, Estimated: 49 mL/min — ABNORMAL LOW (ref >60)
Glucose, Bld: 194 mg/dL — ABNORMAL HIGH (ref 70–99)
Potassium: 4.2 mmol/L (ref 3.5–5.1)
Sodium: 139 mmol/L (ref 135–145)
Total Bilirubin: 0.2 mg/dL — ABNORMAL LOW (ref 0.3–1.2)
Total Protein: 6 g/dL — ABNORMAL LOW (ref 6.5–8.1)

## 2020-08-05 LAB — CBC WITH DIFFERENTIAL/PLATELET
Abs Immature Granulocytes: 0.07 10*3/uL (ref 0.00–0.07)
Basophils Absolute: 0 10*3/uL (ref 0.0–0.1)
Basophils Relative: 0 %
Eosinophils Absolute: 0 10*3/uL (ref 0.0–0.5)
Eosinophils Relative: 0 %
HCT: 39 % (ref 36.0–46.0)
Hemoglobin: 12.4 g/dL (ref 12.0–15.0)
Immature Granulocytes: 1 %
Lymphocytes Relative: 12 %
Lymphs Abs: 1 10*3/uL (ref 0.7–4.0)
MCH: 29.9 pg (ref 26.0–34.0)
MCHC: 31.8 g/dL (ref 30.0–36.0)
MCV: 94 fL (ref 80.0–100.0)
Monocytes Absolute: 0.3 10*3/uL (ref 0.1–1.0)
Monocytes Relative: 4 %
Neutro Abs: 7.1 10*3/uL (ref 1.7–7.7)
Neutrophils Relative %: 83 %
Platelets: 174 10*3/uL (ref 150–400)
RBC: 4.15 MIL/uL (ref 3.87–5.11)
RDW: 14.5 % (ref 11.5–15.5)
WBC: 8.6 10*3/uL (ref 4.0–10.5)
nRBC: 0 % (ref 0.0–0.2)

## 2020-08-05 LAB — FERRITIN: Ferritin: 845 ng/mL — ABNORMAL HIGH (ref 11–307)

## 2020-08-05 LAB — GLUCOSE, CAPILLARY
Glucose-Capillary: 178 mg/dL — ABNORMAL HIGH (ref 70–99)
Glucose-Capillary: 148 mg/dL — ABNORMAL HIGH (ref 70–99)
Glucose-Capillary: 168 mg/dL — ABNORMAL HIGH (ref 70–99)
Glucose-Capillary: 207 mg/dL — ABNORMAL HIGH (ref 70–99)
Glucose-Capillary: 252 mg/dL — ABNORMAL HIGH (ref 70–99)

## 2020-08-05 LAB — D-DIMER, QUANTITATIVE: D-Dimer, Quant: 0.7 ug/mL-FEU — ABNORMAL HIGH (ref 0.00–0.50)

## 2020-08-05 LAB — MAGNESIUM: Magnesium: 2 mg/dL (ref 1.7–2.4)

## 2020-08-05 LAB — C-REACTIVE PROTEIN: CRP: 4.9 mg/dL — ABNORMAL HIGH (ref <1.0)

## 2020-08-05 MED ORDER — LEVALBUTEROL TARTRATE 45 MCG/ACT IN AERO
2.0000 | INHALATION_SPRAY | Freq: Two times a day (BID) | RESPIRATORY_TRACT | Status: DC
  Administered 2020-08-05 – 2020-08-06 (×2): 2 via RESPIRATORY_TRACT

## 2020-08-05 MED ORDER — LIVING WELL WITH DIABETES BOOK: Freq: Once | Status: AC

## 2020-08-05 NOTE — Progress Notes (Addendum)
Inpatient Diabetes Program Recommendations  AACE/ADA: New Consensus Statement on Inpatient Glycemic Control   Target Ranges:  Prepandial:   less than 140 mg/dL      Peak postprandial:   less than 180 mg/dL (1-2 hours)      Critically ill patients:  140 - 180 mg/dL  Results for DEZYRE, HOEFER (MRN [id number]) as of 08/05/2020 08:10  Ref. Range 08/04/2020 07:28 08/04/2020 11:19 08/04/2020 16:07 08/04/2020 21:05 08/05/2020 07:49  Glucose-Capillary Latest Ref Range: 70 - 99 mg/dL 409 (H) 811 (H) 914 (H) 245 (H) 178 (H)    Review of Glycemic Control  Diabetes history: No Outpatient Diabetes medications: NA Current orders for Inpatient glycemic control: Novolog 0-9 units TID with meals, Novolog 0-5 units QHS; Solumedrol 30.8 kg x Q12H  Inpatient Diabetes Program Recommendations:    Insulin: If steroids are continued, please consider ordering Lantus 5 units Q24H. If post prandial glucose remains consistently over 180 mg/dl, please consider ordeirng Novolog 4 units TID with meals for meal coverage if patient eats at least 50% of meals.  HbgA1C: A1C 6.7% on 08/03/20 indicating an average glucose of 146 mg/dl over the past 2-3 months. Note by Dr. Arbutus Leas today notes no history of DM. If patient is being newly dx with DM, please inform patient, inform bedside nursing so they can begin education, and consult inpatient diabetes coordinator.  Addendum 08/05/20@13 :11-Called patient's room phone but no answer.Called patient's cell number in the chart and her daughter [name] answered as she has patient's cell phone. [name] states that she takes her mother to her appointments and is aware of her medical history. [name] reports that patient sees Dr. Sherwood Gambler routinely and that she has no prior hx of DM. Discussed that initial glucose was elevated so an A1C was checked. Explained what an A1C is an explained that current A1C 6.7% indicating an average glucose of 146 mg/dl over the past 2-3 months. Discussed that if A1C  is 6.5% or greater than meets ADA criteria for dx of DM2.  [name] asked if her mother would need to monitor glucose once she is able to come home (she notes that patient will be going to SNF for rehab following hospital discharge).  Informed patient that the discharging provider from SNF would indicate in discharge paperwork if and how often glucose would need to be checked and if patient will need to take DM medication.  [name] notes that her mother is on CAP program and was suppose to have a aide starting this week that came Monday-Friday from 8am-3pm and 8pm-9pm. She states that if patient would need glucose monitoring, the family will be sure it gets done. Encouraged [name] to have patient follow up with Dr. Sherwood Gambler regarding glycemic control. Called patient' room phone again and spoke with patient about glucose trends and A1C. Patient states that she does not have any hx of DM or preDM. Patient states that her PCP has not mentioned anything about her glucose being elevated on prior labs.  Patient reports that the attending providers have not told her anything about DM or A1C. Explained that since her initial glucose was elevated an A1C was checked. Explained what an A1C is and informed patient that her A1C was 6.7% indicating an average glucose of 146 mg/dl. Discussed impact of steroids on glycemic control and informed patient that since she is ordered steroids, glucose will be more elevated. Discussed that as steroids are tapered, the glucose should improve. Patient reports that she is suppose to go to SNF  for rehab. Informed patient that a Living Well with DM book would be ordered. Asked patient to read through the book and informed patient that diabetes coordinator will call her back tomorrow to discuss DM further.Patient verbalized understanding of information and states she has no questions at this time.  Thanks, Orlando Penner, RN, MSN, CDE Diabetes Coordinator Inpatient Diabetes Program [phone]  (Team Pager from 8am to 5pm)

## 2020-08-05 NOTE — [place] FL2 (Signed)
[place] MEDICAID FL2 LEVEL OF CARE SCREENING TOOL     IDENTIFICATION  Patient Name: Amanda Armstrong Birthdate: 03/24/1946 Sex: female Admission Date (Current Location): 08/03/2020  [place] and [place] Number:  Aaron Edelman [id number] P Facility and Address:  [facility],  [address], [place] [address]      Provider Number: [id number]  Attending Physician Name and Address:  Vassie Loll, MD  Relative Name and Phone Number:       Current Level of Care: Hospital Recommended Level of Care: Skilled Nursing Facility Prior Approval Number:    Date Approved/Denied:   PASRR Number: [id number]  Discharge Plan: SNF    Current Diagnoses: Patient Active Problem List   Diagnosis Date Noted  . Tobacco abuse 08/04/2020  . Acute metabolic encephalopathy 08/04/2020  . Acute renal failure superimposed on stage 3b chronic kidney disease (HCC) 08/04/2020  . Acute hypoxemic respiratory failure due to COVID-19 (HCC) 08/03/2020  . UTI (urinary tract infection) 01/29/2016  . Nicotine dependence 09/07/2015  . Enteritis due to Clostridium difficile 08/29/2013  . Weakness 08/02/2013  . Diarrhea 08/02/2013  . Acute pyelonephritis 05/28/2013  . UTI (lower urinary tract infection) 05/27/2013  . Tachypnea 05/27/2013  . Hyperglycemia 05/27/2013  . Arthritis   . Anxiety   . Hypothyroidism     Orientation RESPIRATION BLADDER Height & Weight     Self,Time,Situation,Place  O2 (3L) External catheter Weight: 135 lb (61.2 kg) Height:  4\' 10"  (147.3 cm)  BEHAVIORAL SYMPTOMS/MOOD NEUROLOGICAL BOWEL NUTRITION STATUS      Incontinent Diet (heart healthy/carb modified. See d/c summary for updates.)  AMBULATORY STATUS COMMUNICATION OF NEEDS Skin   Extensive Assist Verbally Normal                       Personal Care Assistance Level of Assistance  Bathing,Feeding,Dressing Bathing Assistance: Maximum assistance Feeding assistance: Limited assistance Dressing Assistance:  Maximum assistance     Functional Limitations Info  Sight,Hearing,Speech Sight Info: Adequate Hearing Info: Adequate Speech Info: Adequate    SPECIAL CARE FACTORS FREQUENCY  PT (By licensed PT)     PT Frequency: 5x weekly              Contractures      Additional Factors Info  Isolation Precautions,Psychotropic,Code Status,Allergies Code Status Info: DNR Allergies Info: Iron, Oxycodone, Sudafed (pseudoephedrine Hcl) Psychotropic Info: Xanax, Trazodone   Isolation Precautions Info: Airborne/contact     Current Medications (08/05/2020):  This is the current hospital active medication list Current Facility-Administered Medications  Medication Dose Route Frequency Provider Last Rate Last Admin  . acetaminophen (TYLENOL) tablet 650 mg  650 mg Oral Q6H PRN 08/07/2020, Pratik D, DO      . ALPRAZolam Sherryll Burger) tablet 1 mg  1 mg Oral QHS Shah, Pratik D, DO   1 mg at 08/04/20 2145  . ascorbic acid (VITAMIN C) tablet 500 mg  500 mg Oral Daily 2146, Pratik D, DO   500 mg at 08/05/20 0853  . chlorpheniramine-HYDROcodone (TUSSIONEX) 10-8 MG/5ML suspension 5 mL  5 mL Oral Q12H PRN 08/07/20, Pratik D, DO      . famotidine (PEPCID) tablet 20 mg  20 mg Oral Daily Sherryll Burger, Pratik D, DO   20 mg at 08/05/20 0852  . guaiFENesin-dextromethorphan (ROBITUSSIN DM) 100-10 MG/5ML syrup 10 mL  10 mL Oral Q4H PRN 08/07/20, Pratik D, DO      . heparin injection 5,000 Units  5,000 Units Subcutaneous Q8H Shah, Pratik D, DO   5,000  Units at 08/05/20 0448  . HYDROmorphone (DILAUDID) injection 0.5 mg  0.5 mg Intravenous Q3H PRN Sherryll Burger, Pratik D, DO      . insulin aspart (novoLOG) injection 0-5 Units  0-5 Units Subcutaneous QHS Maurilio Lovely D, DO   2 Units at 08/04/20 2146  . insulin aspart (novoLOG) injection 0-9 Units  0-9 Units Subcutaneous TID WC Sherryll Burger, Pratik D, DO   2 Units at 08/05/20 0853  . lactated ringers infusion   Intravenous Continuous Maurilio Lovely D, DO 75 mL/hr at 08/04/20 0947 New Bag at 08/04/20 0947  .  levalbuterol (XOPENEX HFA) inhaler 2 puff  2 puff Inhalation BID Vassie Loll, MD      . levothyroxine (SYNTHROID) tablet 75 mcg  75 mcg Oral QAC breakfast Maurilio Lovely D, DO   75 mcg at 08/05/20 0448  . meclizine (ANTIVERT) tablet 25 mg  25 mg Oral TID PRN Sherryll Burger, Pratik D, DO      . methylPREDNISolone sodium succinate (SOLU-MEDROL) 40 mg/mL injection 30.8 mg  0.5 mg/kg Intravenous Q12H Shah, Pratik D, DO   30.8 mg at 08/04/20 2145   Followed by  . [START ON 08/06/2020] predniSONE (DELTASONE) tablet 50 mg  50 mg Oral Daily Shah, Pratik D, DO      . nicotine (NICODERM CQ - dosed in mg/24 hr) patch 7 mg  7 mg Transdermal Daily Sherryll Burger, Pratik D, DO   7 mg at 08/05/20 0853  . ondansetron (ZOFRAN) tablet 4 mg  4 mg Oral Q6H PRN Sherryll Burger, Pratik D, DO       Or  . ondansetron (ZOFRAN) injection 4 mg  4 mg Intravenous Q6H PRN Sherryll Burger, Pratik D, DO      . remdesivir 100 mg in sodium chloride 0.9 % 100 mL IVPB  100 mg Intravenous Daily Sherryll Burger, Pratik D, DO 200 mL/hr at 08/04/20 0949 100 mg at 08/04/20 0949  . terbinafine (LAMISIL) tablet 250 mg  250 mg Oral QPM Sherryll Burger, Pratik D, DO   250 mg at 08/04/20 5449  . traZODone (DESYREL) tablet 50 mg  50 mg Oral QHS PRN Sherryll Burger, Pratik D, DO      . umeclidinium-vilanterol (ANORO ELLIPTA) 62.5-25 MCG/INH 1 puff  1 puff Inhalation Daily Sherryll Burger, Pratik D, DO   1 puff at 08/05/20 0851  . zinc sulfate capsule 220 mg  220 mg Oral Daily Sherryll Burger, Pratik D, DO   220 mg at 08/05/20 2010     Discharge Medications: Please see discharge summary for a list of discharge medications.  Relevant Imaging Results:  Relevant Lab Results:   Additional Information SSN: [id number]. COVID + 08/04/19.  Karn Cassis, LCSW

## 2020-08-05 NOTE — TOC Initial Note (Signed)
Transition of Care [facility]) - Initial/Assessment Note    Patient Details  Name: Amanda Armstrong MRN: [id number] Date of Birth: May 30, 1946  Transition of Care [facility]) CM/SW Contact:    Karn Cassis, LCSW Phone Number: 08/05/2020, 10:49 AM  Clinical Narrative:  Pt admitted with acute hypoxemic respiratory failure due to COVID-19. LCSW unable to reach pt, so assessment completed with daughter and husband. Pt lives with husband and has CAP aid Monday through Friday, 7 hours a day. At baseline, pt ambulates with assistance. PT evaluated pt and recommend SNF. LCSW discussed placement process and family is aware SNF would likely be in [place] or further away due to COVID +. TOC will initiate bed search. Vesta Mixer started (ref #[id number])- waiver currently in place.                  Expected Discharge Plan: Skilled Nursing Facility Barriers to Discharge: SNF Covid   Patient Goals and CMS Choice Patient states their goals for this hospitalization and ongoing recovery are:: short-term rehab   Choice offered to / list presented to : Spouse,Adult Children  Expected Discharge Plan and Services Expected Discharge Plan: Skilled Nursing Facility In-house Referral: Clinical Social Work   Post Acute Care Choice: Skilled Nursing Facility Living arrangements for the past 2 months: Single Family Home                 DME Arranged: N/A                    Prior Living Arrangements/Services Living arrangements for the past 2 months: Single Family Home Lives with:: Spouse Patient language and need for interpreter reviewed:: Yes Do you feel safe going back to the place where you live?: Yes      Need for Family Participation in Patient Care: Yes (Comment) Care giver support system in place?: Yes (comment) Current home services: DME,Other (comment) (walker, shower chair, lift chair, raised toilet seat, wheelchair, CAP aid.) Criminal Activity/Legal Involvement Pertinent to Current  Situation/Hospitalization: No - Comment as needed  Activities of Daily Living Home Assistive Devices/Equipment: Walker (specify type),Wheelchair ADL Screening (condition at time of admission) Patient's cognitive ability adequate to safely complete daily activities?: Yes Is the patient deaf or have difficulty hearing?: No Does the patient have difficulty seeing, even when wearing glasses/contacts?: No Does the patient have difficulty concentrating, remembering, or making decisions?: No Patient able to express need for assistance with ADLs?: Yes Does the patient have difficulty dressing or bathing?: No Independently performs ADLs?: Yes (appropriate for developmental age) Does the patient have difficulty walking or climbing stairs?: Yes Weakness of Legs: Both Weakness of Arms/Hands: None  Permission Sought/Granted                  Emotional Assessment         Alcohol / Substance Use: Not Applicable Psych Involvement: No (comment)  Admission diagnosis:  Hypoxia [R09.02] Altered mental status, unspecified altered mental status type [R41.82] Acute hypoxemic respiratory failure due to COVID-19 (HCC) [U07.1, J96.01] COVID-19 [U07.1] Patient Active Problem List   Diagnosis Date Noted  . Tobacco abuse 08/04/2020  . Acute metabolic encephalopathy 08/04/2020  . Acute renal failure superimposed on stage 3b chronic kidney disease (HCC) 08/04/2020  . Acute hypoxemic respiratory failure due to COVID-19 (HCC) 08/03/2020  . UTI (urinary tract infection) 01/29/2016  . Nicotine dependence 09/07/2015  . Enteritis due to Clostridium difficile 08/29/2013  . Weakness 08/02/2013  . Diarrhea 08/02/2013  . Acute  pyelonephritis 05/28/2013  . UTI (lower urinary tract infection) 05/27/2013  . Tachypnea 05/27/2013  . Hyperglycemia 05/27/2013  . Arthritis   . Anxiety   . Hypothyroidism    PCP:  [name], MD Pharmacy:   [facility] [address], [place] - [address] [place] [address]  [place] [address] [place] [address] Phone: [phone] Fax: [phone]     Social Determinants of Health (SDOH) Interventions    Readmission Risk Interventions No flowsheet data found.

## 2020-08-05 NOTE — Progress Notes (Signed)
Physical Therapy Treatment Patient Details Name: Amanda Armstrong MRN: [id number] DOB: October 07, 1945 Today's Date: 08/05/2020    History of Present Illness Amanda Armstrong is a 75 y.o. female with medical history significant for prior CVA with hypertension and dyslipidemia, chronic tobacco abuse, chronic pain, and hypothyroidism who was brought to the ED via EMS for worsening confusion as well as dyspnea.  She was recently noted to have a UTI and was prescribed ciprofloxacin and has 3 doses remaining.  She lives at home with her husband and her husband states that she has been increasingly confused for the last 1 week or so.  She was also borderline hypoxemic and had elevated blood sugar readings noted on EMS arrival.  Patient is overall a poor historian but states that she has not had any symptoms of cough, rhinorrhea, fever, chills, chest pain, abdominal pain, nausea, vomiting, or diarrhea.  She states that she has noted some worsening shortness of breath over baseline.  She otherwise denies any sick contacts. No speech slurring, headache, focal weakness, or numbness otherwise noted.  Patient does have chronic pain and takes a significant amount of narcotic medication, but there is no suspicion of overdose.  Patient has had COVID vaccination, but no booster.    PT Comments    Patient demonstrates slow labored movement for sitting up at bedside with limited use of BUE to help scoot self forward, had difficulty completing most exercises while seated at bedside due to generalized weakness and c/o fatigue, able to take a few side steps to transfer over to chair before having to sit due to fall risk.  Patient tolerated sitting up in chair after therapy - RN aware.  Patient will benefit from continued physical therapy in hospital and recommended venue below to increase strength, balance, endurance for safe ADLs and gait.     Follow Up Recommendations  SNF     Equipment Recommendations  None recommended  by PT    Recommendations for Other Services       Precautions / Restrictions Precautions Precautions: Fall Restrictions Weight Bearing Restrictions: No    Mobility  Bed Mobility Overal bed mobility: Needs Assistance Bed Mobility: Supine to Sit     Supine to sit: Mod assist;Max assist     General bed mobility comments: increased time labored movement, most difficulty scooting to EOB  Transfers Overall transfer level: Needs assistance Equipment used: Rolling walker (2 wheeled) Transfers: Sit to/from [employer] Sit to Stand: Mod assist Stand pivot transfers: Mod assist       General transfer comment: slow labored movement having to lean over RW due to weakness  Ambulation/Gait Ambulation/Gait assistance: Mod assist Gait Distance (Feet): 5 Feet Assistive device: Rolling walker (2 wheeled) Gait Pattern/deviations: Decreased step length - right;Decreased step length - left;Decreased stride length Gait velocity: decreased   General Gait Details: limited to 4-5 slow labored unsteady side steps due to BLE weakness, generalized weakness   Stairs             Wheelchair Mobility    Modified Rankin (Stroke Patients Only)       Balance Overall balance assessment: Needs assistance Sitting-balance support: Feet supported;No upper extremity supported Sitting balance-Leahy Scale: Fair Sitting balance - Comments: fair/good seated at EOB   Standing balance support: During functional activity;Bilateral upper extremity supported Standing balance-Leahy Scale: Poor Standing balance comment: fair/poor using RW  Cognition Arousal/Alertness: Awake/alert Behavior During Therapy: WFL for tasks assessed/performed Overall Cognitive Status: Within Functional Limits for tasks assessed                                        Exercises General Exercises - Lower Extremity Long Arc Quad:  Seated;AROM;Strengthening;5 reps Hip Flexion/Marching: Seated;AROM;Strengthening;5 reps Toe Raises: Seated;AROM;Strengthening;Both;5 reps Heel Raises: Seated;AROM;Strengthening;Both;5 reps    General Comments        Pertinent Vitals/Pain Pain Assessment: No/denies pain    Home Living                      Prior Function            PT Goals (current goals can now be found in the care plan section) Acute Rehab PT Goals Patient Stated Goal: return home after rehab PT Goal Formulation: With patient Time For Goal Achievement: 09/08/20 Potential to Achieve Goals: Good Progress towards PT goals: Progressing toward goals    Frequency    Min 3X/week      PT Plan      Co-evaluation              AM-PAC PT "6 Clicks" Mobility   Outcome Measure  Help needed turning from your back to your side while in a flat bed without using bedrails?: A Lot Help needed moving from lying on your back to sitting on the side of a flat bed without using bedrails?: A Lot Help needed moving to and from a bed to a chair (including a wheelchair)?: A Lot Help needed standing up from a chair using your arms (e.g., wheelchair or bedside chair)?: A Lot Help needed to walk in hospital room?: A Lot Help needed climbing 3-5 steps with a railing? : Total 6 Click Score: 11    End of Session Equipment Utilized During Treatment: Oxygen Activity Tolerance: Patient tolerated treatment well;Patient limited by fatigue Patient left: in chair;with call bell/phone within reach Nurse Communication: Mobility status PT Visit Diagnosis: Unsteadiness on feet (R26.81);Other abnormalities of gait and mobility (R26.89);Muscle weakness (generalized) (M62.81)     Time: 4128-7867 PT Time Calculation (min) (ACUTE ONLY): 24 min  Charges:  $Therapeutic Exercise: 8-22 mins $Therapeutic Activity: 8-22 mins                     2:25 PM, 08/05/20 Ocie Bob, MPT Physical Therapist with [facility] [phone] office [phone] mobile phone

## 2020-08-05 NOTE — Progress Notes (Signed)
PROGRESS NOTE  Amanda Armstrong NFA:[id number] DOB: [birth year] DOA: 08/03/2020 PCP: Amanda NevinsFusco, [name], MD  Brief History:  75 year old female with a history of stroke with left hemiparesis, hypertension, hyperlipidemia, tobacco abuse, hypothyroidism, and chronic pain presenting with confusion and shortness of breath as well as generalized weakness for about a week.  Unfortunately, the patient is a poor historian.  History is obtained from review of the medical record and speaking with the patient and spouse.  Apparently, the patient has chronic coughing and shortness of breath secondary to her prolonged tobacco use.  But is a bit unclear even after speaking with the patient and spouse whether her pulmonary symptoms had significantly worsened.  Nevertheless, the patient has had some subjective fevers and chills without any nausea, vomiting, diarrhea, hemoptysis, hematochezia, melena.  The patient was prescribed a course of ciprofloxacin by her PCP on 07/30/2020 for 7-day course.  This did not seem to help the patient's confusion.  The patient herself denies any chest pain, headache, sore throat, worsening cough or shortness of breath.  She has had some intermittent abdominal pain, but states that her abdominal pain is improved presently. In the emergency department, the patient was febrile to 101.3 F.  The patient was hypoxic with saturation of oxygen at 88% on room air.  WBC 4.2, hemoglobin 13.0, platelets 162,000.  BMP showed a serum creatinine of 1.61 which is above her usual baseline.  AST 99, ALT 10, alkaline phosphatase 49, total bilirubin 0.4.  The patient was started on steroids and remdesivir.  Assessment/Plan: Acute respiratory failure with hypoxia secondary to COVID-19 -Stable on 2 L presently -Continue to follow inflammatory markers -Continue the use of Vitamin C and zinc -Continue incentive And flutter valve -Continue aspirin and bronchodilator -Continue to wean oxygen  supplementation as tolerated -continue solumedrol and remdesivir  SIRS -Sepsis ruled out -This is likely secondary to the patient's COVID infection, hypoxia and volume depletion -She presented with fever and tachycardia -Blood cultures has remained negative -PCT < 0.10 -UA 6-10 WBC  Acute metabolic encephalopathy -Multifactorial including infectious process, hypoxia, acute on chronic renal failure, and possibly effects from her benzodiazepine and opiates -TSH 0.440 -B12 and folate WNL -UA 6-10 WBC; patient denies dysuria. -continue supportive care and conservative treatment.  Chronic opioid and benzodiazepine use -PMP aware reviewed--patient receives alprazolam # 30 and Norco 10 mg #120 on a monthly basis from one provider -Continue to use judiciously -Patient expressed no pain currently  Lactic acidosis -Primarily due to hypoxia and volume depletion -Continue IV fluids and maintain adequate hydration -Blood cultures has remained negative  Acute on chronic renal failure--CKD stage IIIb -Baseline creatinine 1.1-1.3 -Presented with serum creatinine 1.61 -Continue adequate hydration -Current creatinine 1.15 -Holding furosemide and lisinopril  Hyperglycemia/Diabetes Mellitus type 2 -No history of diabetes mellitus -08/03/20 hemoglobin A1c--6.7 -Continue sliding scale insulin Elevated CBGs in the setting of his steroids usage  Tobacco abuse with presumptive COPD -Patient has nearly 50-pack-year history -Currently smoking 1/2 pack/day -Tobacco cessation discussed -Continue nicoderm patch -Continue Anoro -No wheezing on exam.  Essential hypertension -Holding lisinopril secondary to acute kidney injury and soft blood pressure -Vital signs stable.  Hyperlipidemia -hold statin until LFTs improved  Hypothyroidism -Continue Synthroid -TSH--0.440  Transaminasemia -due to COVID -check hep B surface antigen--neg -check hep C antibody--neg -trend LFTs -hold  statin  Physical deconditioning -Patient seen by physical therapy with recommendations for skilled nursing facility rehabilitation -TOC helping with placement.  Status is: Inpatient  Remains inpatient appropriate because:IV treatments appropriate due to intensity of illness or inability to take PO   Dispo: The patient is from: Home              Anticipated d/c is to: Home              Anticipated d/c date is: 1 day              Patient currently medically stable for discharge to skilled nursing facility for further care and rehabilitation.    Family Communication:   No family at bedside.  Consultants:  none  Code Status:  DNR  DVT Prophylaxis:  [place] Heparin   Procedures: As Listed in Progress Note Above  Antibiotics: None   Subjective: Patient is afebrile, denies chest pain, nausea, vomiting abdominal pain or worsening breathing.  2 L nasal cannula supplementation in place.  Expressed feeling weak, tired and deconditioned.   Objective: Vitals:   08/05/20 0200 08/05/20 0850 08/05/20 0855 08/05/20 1404  BP: 100/61   120/64  Pulse: 75   79  Resp: 16     Temp: 97.6 F (36.4 C)   97.6 F (36.4 C)  TempSrc: Oral   Oral  SpO2: 93% (!) 89% 94% 96%  Weight:      Height:        Intake/Output Summary (Last 24 hours) at 08/05/2020 1658 Last data filed at 08/05/2020 1650 Gross per 24 hour  Intake 1210 ml  Output -  Net 1210 ml   Weight change:   Exam: General exam: Alert, awake and cooperative with examination; following commands appropriately.  Reports no chest pain, no nausea, no vomiting.  Feeling weak, tired and deconditioned.  2 L nasal cannula supplementation in place. Respiratory system: No wheezing, no crackles, no using accessory muscle.  Positive rhonchi appreciated bilaterally. Cardiovascular system: RRR, no rubs, no gallops, no JVD appreciated on exam. Gastrointestinal system: Abdomen is nondistended, soft and nontender. No organomegaly or masses felt.  Normal bowel sounds heard. Central nervous system: Alert and oriented. No focal neurological deficits. Extremities: No cyanosis or clubbing; no edema. Skin: No petechiae. Psychiatry: Mood & affect appropriate.    Data Reviewed: I have personally reviewed following labs and imaging studies  Basic Metabolic Panel: Recent Labs  Lab 08/03/20 1054 08/04/20 0523 08/05/20 0542  NA 137 135 139  K 3.7 4.0 4.2  CL 102 103 105  CO2 25 23 26   GLUCOSE 282* 366* 194*  BUN 15 20 21   CREATININE 1.61* 1.30* 1.15*  CALCIUM 8.4* 7.9* 8.2*  MG  --  1.9 2.0   Liver Function Tests: Recent Labs  Lab 08/03/20 1054 08/04/20 0523 08/05/20 0542  AST 99* 65* 45*  ALT 100* 80* 67*  ALKPHOS 49 43 42  BILITOT 0.4 0.2* 0.2*  PROT 6.7 5.8* 6.0*  ALBUMIN 3.3* 2.7* 2.8*   CBC: Recent Labs  Lab 08/03/20 1054 08/04/20 0523 08/05/20 0542  WBC 4.2 3.8* 8.6  NEUTROABS 3.0 2.9 7.1  HGB 13.0 12.1 12.4  HCT 41.3 37.3 39.0  MCV 96.0 94.2 94.0  PLT 162 158 174   CBG: Recent Labs  Lab 08/04/20 2105 08/05/20 0749 08/05/20 1126 08/05/20 1302 08/05/20 1621  GLUCAP 245* 178* 148* 168* 207*   HbA1C: Recent Labs    08/03/20 1328  HGBA1C 6.7*   Urine analysis:    Component Value Date/Time   COLORURINE YELLOW 08/03/2020 1137   APPEARANCEUR HAZY (A) 08/03/2020 1137   LABSPEC  1.015 08/03/2020 1137   PHURINE 5.0 08/03/2020 1137   GLUCOSEU NEGATIVE 08/03/2020 1137   HGBUR NEGATIVE 08/03/2020 1137   BILIRUBINUR NEGATIVE 08/03/2020 1137   KETONESUR NEGATIVE 08/03/2020 1137   PROTEINUR NEGATIVE 08/03/2020 1137   UROBILINOGEN 0.2 08/27/2013 1130   NITRITE NEGATIVE 08/03/2020 1137   LEUKOCYTESUR NEGATIVE 08/03/2020 1137   Sepsis Labs:  Recent Results (from the past 240 hour(s))  Blood Culture (routine x 2)     Status: None (Preliminary result)   Collection Time: 08/03/20 11:03 AM   Specimen: BLOOD RIGHT HAND  Result Value Ref Range Status   Specimen Description BLOOD RIGHT HAND  Final    Special Requests   Final    BOTTLES DRAWN AEROBIC AND ANAEROBIC Blood Culture results may not be optimal due to an inadequate volume of blood received in culture bottles   Culture   Final    NO GROWTH 2 DAYS Performed at [facility], [address] [place], [place] [address]    Report Status PENDING  Incomplete  Blood Culture (routine x 2)     Status: None (Preliminary result)   Collection Time: 08/03/20 11:35 AM   Specimen: BLOOD LEFT HAND  Result Value Ref Range Status   Specimen Description BLOOD LEFT HAND  Final   Special Requests   Final    BOTTLES DRAWN AEROBIC AND ANAEROBIC Blood Culture results may not be optimal due to an inadequate volume of blood received in culture bottles   Culture   Final    NO GROWTH 2 DAYS Performed at [facility], [address], [place], [place] [address]    Report Status PENDING  Incomplete  Urine culture     Status: None   Collection Time: 08/03/20 11:37 AM   Specimen: Urine, Clean Catch  Result Value Ref Range Status   Specimen Description   Final    URINE, CLEAN CATCH Performed at [facility], [address] [place], [place] [address]    Special Requests   Final    NONE Performed at [facility], [address], [place], [place] [address]    Culture   Final    NO GROWTH Performed at [facility], [address] [place], [place] [address]    Report Status 08/04/2020 FINAL  Final     Scheduled Meds: . ALPRAZolam  1 mg Oral QHS  . vitamin C  500 mg Oral Daily  . famotidine  20 mg Oral Daily  . heparin  5,000 Units Subcutaneous Q8H  . insulin aspart  0-5 Units Subcutaneous QHS  . insulin aspart  0-9 Units Subcutaneous TID WC  . levalbuterol  2 puff Inhalation BID  . levothyroxine  75 mcg Oral QAC breakfast  . living well with diabetes book   Does not apply Once  . methylPREDNISolone (SOLU-MEDROL) injection  0.5 mg/kg Intravenous Q12H   Followed by  . [START ON 08/06/2020] predniSONE  50 mg Oral Daily  . nicotine  7 mg  Transdermal Daily  . terbinafine  250 mg Oral QPM  . umeclidinium-vilanterol  1 puff Inhalation Daily  . zinc sulfate  220 mg Oral Daily   Continuous Infusions: . lactated ringers 75 mL/hr at 08/05/20 1325  . remdesivir 100 mg in NS 100 mL 100 mg (08/05/20 0926)    Procedures/Studies: CT Head Wo Contrast  Result Date: 08/03/2020 CLINICAL DATA:  Altered mental status EXAM: CT HEAD WITHOUT CONTRAST TECHNIQUE: Contiguous axial images were obtained from the base of the skull through  the vertex without intravenous contrast. COMPARISON:  July 29, 2018 head CT; July 27, 2018 brain MRI FINDINGS: Brain: Stable changes of atrophy with ventricles relatively larger than sulci in proportion, stable. There is no intracranial mass, hemorrhage, extra-axial fluid collection, or midline shift. Patchy small vessel disease in the centra semiovale bilaterally is stable. There is evidence of a prior infarct in the head of the caudate nucleus on the right. No acute appearing infarct evident. Vascular: No hyperdense vessel. Calcification noted in each carotid siphon region. Skull: Bony calvarium appears intact. Sinuses/Orbits: There is mucosal thickening in several ethmoid air cells. Other visualized paranasal sinuses are clear. Orbits appear symmetric bilaterally. Other: There are opacified mastoid air cells on the right inferiorly. Mastoids elsewhere are clear. IMPRESSION: Stable atrophy with questionable superimposed degree of normal pressure hydrocephalus. There is patchy small vessel disease in the centra semiovale bilaterally, stable. There is a prior lacunar type infarct in the head of the caudate nucleus on the right. No acute infarct evident. No mass or hemorrhage. There are foci of arterial vascular calcification. There is mucosal thickening in several ethmoid air cells. There is mastoid air cell disease on the right inferiorly. Electronically Signed   By: Bretta Bang III M.D.   On: 08/03/2020 13:17    DG Chest Portable 1 View  Result Date: 08/03/2020 CLINICAL DATA:  Altered mental status EXAM: PORTABLE CHEST 1 VIEW COMPARISON:  December 17, 2018 FINDINGS: There is airspace opacity in the right upper lobe, right mid lung, and left base regions. Heart size and pulmonary vascularity are normal. No adenopathy. No bone lesions. IMPRESSION: Airspace opacity at several sites, consistent with multifocal pneumonia. Suspect atypical organism pneumonia. Correlation with COVID-19 status advised. No consolidation evident. Heart size normal.  No adenopathy. Electronically Signed   By: Bretta Bang III M.D.   On: 08/03/2020 10:57    Vassie Loll, MD [facility]  If 7PM-7AM, please contact night-coverage [email or web address] Password [email or web address] 08/05/2020, 4:58 PM   LOS: 2 days

## 2020-08-05 NOTE — Progress Notes (Signed)
Sat up in chair for several hours after therapy.  Assist of two back to bed and complained that she wants to go home.  Has had multiple calls from family today.

## 2020-08-05 NOTE — Progress Notes (Signed)
Tele called reporting 16 beat run vtach with pulse increasing to 150's.  Now SR and 87 on monitor.  BP  127/71.  This information sent to Dr. Gwenlyn Perking by chat

## 2020-08-06 DIAGNOSIS — U071 COVID-19: Secondary | ICD-10-CM | POA: Diagnosis not present

## 2020-08-06 DIAGNOSIS — E039 Hypothyroidism, unspecified: Secondary | ICD-10-CM | POA: Diagnosis not present

## 2020-08-06 DIAGNOSIS — J449 Chronic obstructive pulmonary disease, unspecified: Secondary | ICD-10-CM | POA: Diagnosis not present

## 2020-08-06 DIAGNOSIS — N179 Acute kidney failure, unspecified: Secondary | ICD-10-CM | POA: Diagnosis not present

## 2020-08-06 DIAGNOSIS — R531 Weakness: Secondary | ICD-10-CM | POA: Diagnosis not present

## 2020-08-06 DIAGNOSIS — R4182 Altered mental status, unspecified: Secondary | ICD-10-CM | POA: Diagnosis not present

## 2020-08-06 DIAGNOSIS — J440 Chronic obstructive pulmonary disease with acute lower respiratory infection: Secondary | ICD-10-CM | POA: Diagnosis not present

## 2020-08-06 DIAGNOSIS — Z743 Need for continuous supervision: Secondary | ICD-10-CM | POA: Diagnosis not present

## 2020-08-06 DIAGNOSIS — Z7401 Bed confinement status: Secondary | ICD-10-CM | POA: Diagnosis not present

## 2020-08-06 DIAGNOSIS — I69828 Other speech and language deficits following other cerebrovascular disease: Secondary | ICD-10-CM | POA: Diagnosis not present

## 2020-08-06 DIAGNOSIS — Z720 Tobacco use: Secondary | ICD-10-CM | POA: Diagnosis not present

## 2020-08-06 DIAGNOSIS — M810 Age-related osteoporosis without current pathological fracture: Secondary | ICD-10-CM | POA: Diagnosis not present

## 2020-08-06 DIAGNOSIS — N1832 Chronic kidney disease, stage 3b: Secondary | ICD-10-CM | POA: Diagnosis not present

## 2020-08-06 DIAGNOSIS — E1122 Type 2 diabetes mellitus with diabetic chronic kidney disease: Secondary | ICD-10-CM | POA: Diagnosis not present

## 2020-08-06 DIAGNOSIS — Z66 Do not resuscitate: Secondary | ICD-10-CM | POA: Diagnosis not present

## 2020-08-06 DIAGNOSIS — J1282 Pneumonia due to coronavirus disease 2019: Secondary | ICD-10-CM | POA: Diagnosis not present

## 2020-08-06 DIAGNOSIS — M1991 Primary osteoarthritis, unspecified site: Secondary | ICD-10-CM | POA: Diagnosis not present

## 2020-08-06 DIAGNOSIS — I129 Hypertensive chronic kidney disease with stage 1 through stage 4 chronic kidney disease, or unspecified chronic kidney disease: Secondary | ICD-10-CM | POA: Diagnosis not present

## 2020-08-06 DIAGNOSIS — R279 Unspecified lack of coordination: Secondary | ICD-10-CM | POA: Diagnosis not present

## 2020-08-06 DIAGNOSIS — K219 Gastro-esophageal reflux disease without esophagitis: Secondary | ICD-10-CM | POA: Diagnosis not present

## 2020-08-06 DIAGNOSIS — R2681 Unsteadiness on feet: Secondary | ICD-10-CM | POA: Diagnosis not present

## 2020-08-06 DIAGNOSIS — M6281 Muscle weakness (generalized): Secondary | ICD-10-CM | POA: Diagnosis not present

## 2020-08-06 DIAGNOSIS — E785 Hyperlipidemia, unspecified: Secondary | ICD-10-CM | POA: Diagnosis not present

## 2020-08-06 DIAGNOSIS — E440 Moderate protein-calorie malnutrition: Secondary | ICD-10-CM | POA: Diagnosis not present

## 2020-08-06 DIAGNOSIS — F5101 Primary insomnia: Secondary | ICD-10-CM | POA: Diagnosis not present

## 2020-08-06 DIAGNOSIS — R7401 Elevation of levels of liver transaminase levels: Secondary | ICD-10-CM | POA: Diagnosis not present

## 2020-08-06 DIAGNOSIS — I69354 Hemiplegia and hemiparesis following cerebral infarction affecting left non-dominant side: Secondary | ICD-10-CM | POA: Diagnosis not present

## 2020-08-06 DIAGNOSIS — E872 Acidosis: Secondary | ICD-10-CM | POA: Diagnosis not present

## 2020-08-06 DIAGNOSIS — R739 Hyperglycemia, unspecified: Secondary | ICD-10-CM | POA: Diagnosis not present

## 2020-08-06 DIAGNOSIS — J9601 Acute respiratory failure with hypoxia: Secondary | ICD-10-CM | POA: Diagnosis not present

## 2020-08-06 DIAGNOSIS — E1165 Type 2 diabetes mellitus with hyperglycemia: Secondary | ICD-10-CM | POA: Diagnosis not present

## 2020-08-06 DIAGNOSIS — G9341 Metabolic encephalopathy: Secondary | ICD-10-CM | POA: Diagnosis not present

## 2020-08-06 DIAGNOSIS — G8929 Other chronic pain: Secondary | ICD-10-CM | POA: Diagnosis not present

## 2020-08-06 DIAGNOSIS — R6510 Systemic inflammatory response syndrome (SIRS) of non-infectious origin without acute organ dysfunction: Secondary | ICD-10-CM | POA: Diagnosis not present

## 2020-08-06 DIAGNOSIS — R0902 Hypoxemia: Secondary | ICD-10-CM | POA: Diagnosis not present

## 2020-08-06 DIAGNOSIS — N390 Urinary tract infection, site not specified: Secondary | ICD-10-CM | POA: Diagnosis not present

## 2020-08-06 LAB — COMPREHENSIVE METABOLIC PANEL
ALT: 59 U/L — ABNORMAL HIGH (ref 0–44)
AST: 42 U/L — ABNORMAL HIGH (ref 15–41)
Albumin: 2.6 g/dL — ABNORMAL LOW (ref 3.5–5.0)
Alkaline Phosphatase: 46 U/L (ref 38–126)
Anion gap: 9 (ref 5–15)
BUN: 22 mg/dL (ref 8–23)
CO2: 24 mmol/L (ref 22–32)
Calcium: 8.2 mg/dL — ABNORMAL LOW (ref 8.9–10.3)
Chloride: 105 mmol/L (ref 98–111)
Creatinine, Ser: 1.08 mg/dL — ABNORMAL HIGH (ref 0.44–1.00)
GFR, Estimated: 53 mL/min — ABNORMAL LOW (ref >60)
Glucose, Bld: 213 mg/dL — ABNORMAL HIGH (ref 70–99)
Potassium: 3.9 mmol/L (ref 3.5–5.1)
Sodium: 138 mmol/L (ref 135–145)
Total Bilirubin: 0.3 mg/dL (ref 0.3–1.2)
Total Protein: 5.9 g/dL — ABNORMAL LOW (ref 6.5–8.1)

## 2020-08-06 LAB — CBC WITH DIFFERENTIAL/PLATELET
Abs Immature Granulocytes: 0.12 10*3/uL — ABNORMAL HIGH (ref 0.00–0.07)
Basophils Absolute: 0 10*3/uL (ref 0.0–0.1)
Basophils Relative: 0 %
Eosinophils Absolute: 0 10*3/uL (ref 0.0–0.5)
Eosinophils Relative: 0 %
HCT: 38.8 % (ref 36.0–46.0)
Hemoglobin: 12.6 g/dL (ref 12.0–15.0)
Immature Granulocytes: 2 %
Lymphocytes Relative: 12 %
Lymphs Abs: 0.8 10*3/uL (ref 0.7–4.0)
MCH: 30.1 pg (ref 26.0–34.0)
MCHC: 32.5 g/dL (ref 30.0–36.0)
MCV: 92.6 fL (ref 80.0–100.0)
Monocytes Absolute: 0.3 10*3/uL (ref 0.1–1.0)
Monocytes Relative: 5 %
Neutro Abs: 5.5 10*3/uL (ref 1.7–7.7)
Neutrophils Relative %: 81 %
Platelets: 203 10*3/uL (ref 150–400)
RBC: 4.19 MIL/uL (ref 3.87–5.11)
RDW: 14.6 % (ref 11.5–15.5)
WBC: 6.8 10*3/uL (ref 4.0–10.5)
nRBC: 0 % (ref 0.0–0.2)

## 2020-08-06 LAB — C-REACTIVE PROTEIN: CRP: 2.7 mg/dL — ABNORMAL HIGH (ref <1.0)

## 2020-08-06 LAB — D-DIMER, QUANTITATIVE: D-Dimer, Quant: 3.97 ug/mL-FEU — ABNORMAL HIGH (ref 0.00–0.50)

## 2020-08-06 LAB — GLUCOSE, CAPILLARY
Glucose-Capillary: 192 mg/dL — ABNORMAL HIGH (ref 70–99)
Glucose-Capillary: 258 mg/dL — ABNORMAL HIGH (ref 70–99)

## 2020-08-06 LAB — FERRITIN: Ferritin: 642 ng/mL — ABNORMAL HIGH (ref 11–307)

## 2020-08-06 LAB — MAGNESIUM: Magnesium: 2.1 mg/dL (ref 1.7–2.4)

## 2020-08-06 MED ORDER — LEVALBUTEROL TARTRATE 45 MCG/ACT IN AERO: 2.0000 | INHALATION_SPRAY | Freq: Two times a day (BID) | RESPIRATORY_TRACT | 12 refills | Status: DC

## 2020-08-06 MED ORDER — PREDNISONE 20 MG PO TABS: ORAL_TABLET | ORAL | Status: DC

## 2020-08-06 MED ORDER — GUAIFENESIN-DM 100-10 MG/5ML PO SYRP: 10.0000 mL | ORAL_SOLUTION | ORAL | 0 refills | Status: DC | PRN

## 2020-08-06 MED ORDER — NICOTINE 7 MG/24HR TD PT24: 7.0000 mg | MEDICATED_PATCH | Freq: Every day | TRANSDERMAL | 0 refills | Status: DC

## 2020-08-06 MED ORDER — ASCORBIC ACID 500 MG PO TABS: 500.0000 mg | ORAL_TABLET | Freq: Every day | ORAL | Status: DC

## 2020-08-06 MED ORDER — ZINC SULFATE 220 (50 ZN) MG PO CAPS: 220.0000 mg | ORAL_CAPSULE | Freq: Every day | ORAL | Status: DC

## 2020-08-06 MED ORDER — HYDROCODONE-ACETAMINOPHEN 10-325 MG PO TABS: 1.0000 | ORAL_TABLET | Freq: Three times a day (TID) | ORAL | 0 refills | Status: DC | PRN

## 2020-08-06 MED ORDER — ALPRAZOLAM 1 MG PO TABS: 1.0000 mg | ORAL_TABLET | Freq: Three times a day (TID) | ORAL | 0 refills | Status: DC | PRN

## 2020-08-06 NOTE — Discharge Summary (Signed)
Physician Discharge Summary  Amanda SailorsRebekah P Armstrong ZOX:[id number] DOB: 1945-09-12 DOA: 08/03/2020  PCP: [name], [name], MD  Admit date: 08/03/2020 Discharge date: 08/06/2020  Time spent: 35 minutes  Recommendations for Outpatient Follow-up:  1. Repeat complete metabolic panel to evaluate lites, renal function and LFTs 2. Reassess CBGs and initiate treatment for type 2 diabetes with appropriate oral hypoglycemic agents 3. Reassess blood pressure and adjust antihypertensive regimen as needed 4. If renal function has remained stable resume lisinopril and Lasix.   Discharge Diagnoses:  Tobacco abuse Acute metabolic encephalopathy Acute renal failure superimposed on stage 3b chronic kidney disease (HCC) Altered mental status Type 2 diabetes mellitus COPD Acute respiratory failure with hypoxia secondary to COVID-19 pneumonia Hypertension Hyperlipidemia  Discharge Condition: Stable and improved.  Discharged to skilled nursing facility for further care and rehabilitation.  CODE STATUS: DNR  Diet recommendation: Modified carbohydrate diet and heart healthy diet.  Filed Weights   08/03/20 1027  Weight: 61.2 kg    History of present illness:  75 year old female with a history of stroke with left hemiparesis, hypertension, hyperlipidemia, tobacco abuse, hypothyroidism, and chronic pain presenting with confusion and shortness of breath as well as generalized weakness for about a week.  Unfortunately, the patient is a poor historian.  History is obtained from review of the medical record and speaking with the patient and spouse.  Apparently, the patient has chronic coughing and shortness of breath secondary to her prolonged tobacco use.  But is a bit unclear even after speaking with the patient and spouse whether her pulmonary symptoms had significantly worsened.  Nevertheless, the patient has had some subjective fevers and chills without any nausea, vomiting, diarrhea, hemoptysis, hematochezia,  melena.  The patient was prescribed a course of ciprofloxacin by her PCP on 07/30/2020 for 7-day course.  This did not seem to help the patient's confusion.  The patient herself denies any chest pain, headache, sore throat, worsening cough or shortness of breath.  She has had some intermittent abdominal pain, but states that her abdominal pain is improved presently. In the emergency department, the patient was febrile to 101.3 F.  The patient was hypoxic with saturation of oxygen at 88% on room air.  WBC 4.2, hemoglobin 13.0, platelets 162,000.  BMP showed a serum creatinine of 1.61 which is above her usual baseline.  AST 99, ALT 10, alkaline phosphatase 49, total bilirubin 0.4.  The patient was started on steroids and remdesivir.   Hospital Course:  Acute respiratory failure with hypoxia secondary to COVID-19 -Continue the use of Vitamin C and zinc -Continue incentive spirometer and flutter valve -Continue as needed bronchodilator -Patient has successfully completed remdesivir infusion; and has been discharged on oral steroids to complete therapy as an outpatient. -Rehabilitation and conditioning at a skilled nursing facility. -Continue to wean oxygen supplementation as tolerated; requiring 2 L nasal cannula supplementation at this time.  SIRS -Sepsis ruled out -This is likely secondary to the patient's COVID infection, hypoxia and volume depletion -She presented with fever and tachycardia -Blood cultures has remained negative -PCT < 0.10 -UA 6-10 WBC  Acute metabolic encephalopathy -Multifactorial including infectious process, hypoxia, acute on chronic renal failure, and possibly effects from her benzodiazepine and opiates -TSH 0.440 -B12 and folate WNL -UA 6-10 WBC; patient denies dysuria. -continue supportive care and conservative treatment.  Chronic opioid and benzodiazepine use -PMP aware reviewed--patient receives alprazolam # 30 and Norco 10 mg #120 on a monthly basis from  one provider -Continue to use judiciously -Patient expressed no pain  currently.  Lactic acidosis -Primarily due to hypoxia and volume depletion -Improved/Resolved with fluid resuscitation and supportive care. -Blood culture remains negative -Patient afebrile at discharge. -No antibiotics provided.  Acute on chronic renal failure--CKD stage IIIb -Baseline creatinine 1.1-1.3 -Presented with serum creatinine 1.61 -Continue  to maintain adequate hydration -Repeat complete metabolic panel to follow twice renal function in 5 days -Creatinine very close to her baseline at time of discharge.  We will continue holding torsemide and lisinopril at this moment.  New diagnosis: hyperglycemia/Diabetes Mellitus type 2 -No prior history of diabetes mellitus -08/03/20 hemoglobin A1c--6.7 -Patient CBGs elevated and fluctuating in the setting of steroid usage -Will follow modified carbohydrate diet and determine if follow-up with PCP Best long-term therapy for hypoglycemia.  Given patient's age and eating habits will recommend the use of metformin versus glimepiride at that time.  Tobacco abuse with presumptive COPD -Patient has nearly 50-pack-year history -Currently smoking 1/2 pack/day -Tobacco cessation discussed -Continue nicoderm patch -Continue Anoro -No wheezing on exam.  Essential hypertension -Holding lisinopril and Lasix secondary to acute kidney injury and soft blood pressure -Renal function very close to baseline at time of discharge.  Hyperlipidemia -hold statin until LFTs improved  Hypothyroidism -Continue Synthroid -TSH--0.440  Transaminasemia -due to COVID -Hepatitis B surface antigen and hepatitis C antibody negative. -Continue holding statin until follow-up with PCP -Repeat complete metabolic panel to follow LFTs trend.  Physical deconditioning -Patient seen by physical therapy with recommendations for skilled nursing facility rehabilitation -[facility] helping with  placement.   Procedures: See below for x-ray report  Consultations:  None  Discharge Exam: Vitals:   08/06/20 0818 08/06/20 0923  BP:  128/73  Pulse:  (!) 103  Resp:    Temp:    SpO2: 92% 95%    General: Oriented x2, in no acute distress.  Following commands appropriately.  2 L nasal: Supplementation in place. Cardiovascular: S1 and S2, no rubs, no gallops, no JVD. Respiratory: Improved air movement bilaterally; positive rhonchi appreciated.  No wheezing or crackles currently. Abdomen: Soft, nontender, distended, positive bowel sounds Extremities: No cyanosis or clubbing.  Discharge Instructions   Discharge Instructions    Diet - low sodium heart healthy   Complete by: As directed    Discharge instructions   Complete by: As directed    The medications are prescribed Maintain adequate hydration Follow heart healthy Repeat basic metabolic panel in 5 days to follow his lites and renal function; at that time resume diuretics and lisinopril if appropriate. Completed steroids management and continue weaning oxygen supplementation to room air as tolerated; at discharge requiring 2 L nasal cannula supplementation. Physical rehabilitation and conditioning as per the skilled nursing facility protocol.     Allergies as of 08/06/2020      Reactions   Iron Other (See Comments)   "upset stomach"   Oxycodone Other (See Comments)   "CX HER TO FELL LIKE SHE IS OUT OF HER BODY"   Sudafed [pseudoephedrine Hcl] Other (See Comments)   "funny feeling"      Medication List    STOP taking these medications   atorvastatin 20 MG tablet Commonly known as: LIPITOR   ciprofloxacin 500 MG tablet Commonly known as: CIPRO   furosemide 20 MG tablet Commonly known as: LASIX   HYDROcodone-acetaminophen 10-325 MG tablet Commonly known as: NORCO   ibuprofen 400 MG tablet Commonly known as: ADVIL   lisinopril 20 MG tablet Commonly known as: ZESTRIL   methocarbamol 500 MG  tablet Commonly known as: ROBAXIN  sulfamethoxazole-trimethoprim 400-80 MG tablet Commonly known as: BACTRIM     TAKE these medications   acetaminophen 325 MG tablet Commonly known as: Tylenol Take 2 tablets (650 mg total) by mouth every 6 (six) hours as needed for up to 30 doses for mild pain or moderate pain.   alendronate 70 MG tablet Commonly known as: FOSAMAX Take 70 mg by mouth every Sunday.   ALPRAZolam 1 MG tablet Commonly known as: XANAX Take 1 tablet (1 mg total) by mouth every 8 (eight) hours as needed for anxiety. What changed:   when to take this  reasons to take this   ascorbic acid 500 MG tablet Commonly known as: VITAMIN C Take 1 tablet (500 mg total) by mouth daily. Start taking on: August 07, 2020   famotidine 20 MG tablet Commonly known as: Pepcid Take 1 tablet (20 mg total) by mouth 2 (two) times daily.   guaiFENesin-dextromethorphan 100-10 MG/5ML syrup Commonly known as: ROBITUSSIN DM Take 10 mLs by mouth every 4 (four) hours as needed for cough.   levalbuterol 45 MCG/ACT inhaler Commonly known as: XOPENEX HFA Inhale 2 puffs into the lungs 2 (two) times daily.   levothyroxine 100 MCG tablet Commonly known as: SYNTHROID Take 75 mcg by mouth daily before breakfast.   meclizine 25 MG tablet Commonly known as: ANTIVERT Take 1 tablet (25 mg total) by mouth 3 (three) times daily as needed for dizziness.   nicotine 7 mg/24hr patch Commonly known as: NICODERM CQ - dosed in mg/24 hr Place 1 patch (7 mg total) onto the skin daily. Start taking on: August 07, 2020   predniSONE 20 MG tablet Commonly known as: DELTASONE Take 3 tablet by mouth daily x2 days; then 2 tablets by mouth daily x2 days; then 1 tablet by mouth daily x3 days; then half tablet by mouth x3 days and stop prednisone.   terbinafine 250 MG tablet Commonly known as: LAMISIL Take 250 mg by mouth every evening.   traZODone 50 MG tablet Commonly known as: DESYREL Take 50 mg by  mouth at bedtime.   umeclidinium-vilanterol 62.5-25 MCG/INH Aepb Commonly known as: ANORO ELLIPTA Inhale 1 puff into the lungs daily.   zinc sulfate 220 (50 Zn) MG capsule Take 1 capsule (220 mg total) by mouth daily. Start taking on: August 07, 2020      Allergies  Allergen Reactions  . Iron Other (See Comments)    "upset stomach"  . Oxycodone Other (See Comments)    "CX HER TO FELL LIKE SHE IS OUT OF HER BODY"  . Sudafed [Pseudoephedrine Hcl] Other (See Comments)    "funny feeling"    Contact information for follow-up providers    [name], MD. Schedule an appointment as soon as possible for a visit in 2 week(s).   Specialty: Internal Medicine Why: After discharge from the skilled nursing facility. Contact information: [address] [place] [address] [phone]            Contact information for after-discharge care    Destination    [facility] Preferred SNF .   Service: Skilled Nursing Contact information: [address] [place] [address] [phone]                  The results of significant diagnostics from this hospitalization (including imaging, microbiology, ancillary and laboratory) are listed below for reference.    Significant Diagnostic Studies: CT Head Wo Contrast  Result Date: 08/03/2020 CLINICAL DATA:  Altered mental  status EXAM: CT HEAD WITHOUT CONTRAST TECHNIQUE: Contiguous axial images were obtained from the base of the skull through the vertex without intravenous contrast. COMPARISON:  July 29, 2018 head CT; July 27, 2018 brain MRI FINDINGS: Brain: Stable changes of atrophy with ventricles relatively larger than sulci in proportion, stable. There is no intracranial mass, hemorrhage, extra-axial fluid collection, or midline shift. Patchy small vessel disease in the centra semiovale bilaterally is stable. There is evidence of a prior infarct in the head of the caudate  nucleus on the right. No acute appearing infarct evident. Vascular: No hyperdense vessel. Calcification noted in each carotid siphon region. Skull: Bony calvarium appears intact. Sinuses/Orbits: There is mucosal thickening in several ethmoid air cells. Other visualized paranasal sinuses are clear. Orbits appear symmetric bilaterally. Other: There are opacified mastoid air cells on the right inferiorly. Mastoids elsewhere are clear. IMPRESSION: Stable atrophy with questionable superimposed degree of normal pressure hydrocephalus. There is patchy small vessel disease in the centra semiovale bilaterally, stable. There is a prior lacunar type infarct in the head of the caudate nucleus on the right. No acute infarct evident. No mass or hemorrhage. There are foci of arterial vascular calcification. There is mucosal thickening in several ethmoid air cells. There is mastoid air cell disease on the right inferiorly. Electronically Signed   By: Bretta BangWilliam  Woodruff III M.D.   On: 08/03/2020 13:17   DG Chest Portable 1 View  Result Date: 08/03/2020 CLINICAL DATA:  Altered mental status EXAM: PORTABLE CHEST 1 VIEW COMPARISON:  December 17, 2018 FINDINGS: There is airspace opacity in the right upper lobe, right mid lung, and left base regions. Heart size and pulmonary vascularity are normal. No adenopathy. No bone lesions. IMPRESSION: Airspace opacity at several sites, consistent with multifocal pneumonia. Suspect atypical organism pneumonia. Correlation with COVID-19 status advised. No consolidation evident. Heart size normal.  No adenopathy. Electronically Signed   By: Bretta BangWilliam  Woodruff III M.D.   On: 08/03/2020 10:57    Microbiology: Recent Results (from the past 240 hour(s))  Blood Culture (routine x 2)     Status: None (Preliminary result)   Collection Time: 08/03/20 11:03 AM   Specimen: BLOOD RIGHT HAND  Result Value Ref Range Status   Specimen Description BLOOD RIGHT HAND  Final   Special Requests   Final     BOTTLES DRAWN AEROBIC AND ANAEROBIC Blood Culture results may not be optimal due to an inadequate volume of blood received in culture bottles   Culture   Final    NO GROWTH 3 DAYS Performed at [facility], [address] [place], [place] [address]    Report Status PENDING  Incomplete  Blood Culture (routine x 2)     Status: None (Preliminary result)   Collection Time: 08/03/20 11:35 AM   Specimen: BLOOD LEFT HAND  Result Value Ref Range Status   Specimen Description BLOOD LEFT HAND  Final   Special Requests   Final    BOTTLES DRAWN AEROBIC AND ANAEROBIC Blood Culture results may not be optimal due to an inadequate volume of blood received in culture bottles   Culture   Final    NO GROWTH 3 DAYS Performed at [facility], [address] [place], [place] [address]    Report Status PENDING  Incomplete  Urine culture     Status: None   Collection Time: 08/03/20 11:37 AM   Specimen: Urine, Clean Catch  Result Value Ref Range Status   Specimen Description   Final  URINE, CLEAN CATCH Performed at [facility], [address] [place], [place] [address]    Special Requests   Final    NONE Performed at [facility], [address], [place], [place] [address]    Culture   Final    NO GROWTH Performed at [facility], [address], [place], [place] [address]    Report Status 08/04/2020 FINAL  Final     Labs: Basic Metabolic Panel: Recent Labs  Lab 08/03/20 1054 08/04/20 0523 08/05/20 0542 08/06/20 0612  NA 137 135 139 138  K 3.7 4.0 4.2 3.9  CL 102 103 105 105  CO2 25 23 26 24   GLUCOSE 282* 366* 194* 213*  BUN 15 20 21 22   CREATININE 1.61* 1.30* 1.15* 1.08*  CALCIUM 8.4* 7.9* 8.2* 8.2*  MG  --  1.9 2.0 2.1   Liver Function Tests: Recent Labs  Lab 08/03/20 1054 08/04/20 0523 08/05/20 0542 08/06/20 0612  AST 99* 65* 45* 42*  ALT 100* 80* 67* 59*  ALKPHOS 49 43 42 46  BILITOT 0.4 0.2* 0.2* 0.3  PROT 6.7 5.8* 6.0* 5.9*  ALBUMIN 3.3* 2.7* 2.8* 2.6*    CBC: Recent Labs  Lab 08/03/20 1054 08/04/20 0523 08/05/20 0542 08/06/20 0612  WBC 4.2 3.8* 8.6 6.8  NEUTROABS 3.0 2.9 7.1 5.5  HGB 13.0 12.1 12.4 12.6  HCT 41.3 37.3 39.0 38.8  MCV 96.0 94.2 94.0 92.6  PLT 162 158 174 203   CBG: Recent Labs  Lab 08/05/20 1126 08/05/20 1302 08/05/20 1621 08/05/20 2022 08/06/20 0756  GLUCAP 148* 168* 207* 252* 192*    Signed:  08/07/20 MD.  [facility] 08/06/2020, 1:26 PM

## 2020-08-06 NOTE — Progress Notes (Signed)
Report called to [place] at [facility] in [place].  IV remove and EMS transported patient.  Family notified

## 2020-08-06 NOTE — Progress Notes (Signed)
Inpatient Diabetes Program Recommendations  AACE/ADA: New Consensus Statement on Inpatient Glycemic Control (  Target Ranges:  Prepandial:   less than 140 mg/dL      Peak postprandial:   less than 180 mg/dL (1-2 hours)      Critically ill patients:  140 - 180 mg/dL  Results for ADJA, RUFF (MRN [id number]) as of 08/06/2020 12:22  Ref. Range 08/05/2020 07:49 08/05/2020 11:26 08/05/2020 13:02 08/05/2020 16:21 08/05/2020 20:22 08/06/2020 07:56  Glucose-Capillary Latest Ref Range: 70 - 99 mg/dL 299 (H) 371 (H) 696 (H) 207 (H) 252 (H) 192 (H)   Results for DAYLE, SHERPA (MRN [id number]) as of 08/06/2020 12:22  Ref. Range 08/03/2020 13:28  Hemoglobin A1C Latest Ref Range: 4.8 - 5.6 % 6.7 (H)   Review of Glycemic Control  Diabetes history: NO Outpatient Diabetes medications: NA Current orders for Inpatient glycemic control: Novolog 0-9 units TID with meals, Novolog 0-5 units QHS; Prednisone 50 mg daily  Inpatient Diabetes Program Recommendations:    HbgA1C: A1C 6.7% on 08/03/20 indicating an average glucose of 146 mg/dl over the past 2-3 months.  Per chart, patient is being newly dx with DM2.  NOTE: Spoke with patient over the phone about new DM dx. Patient states that she still has not received Living Well with DM book that was ordered yesterday. Asked patient to be sure to read entire book when received.  Reviewed A1C results and what an A1C is. Discussed basic pathophysiology of DM Type 2, basic home care, importance of checking CBGs and maintaining good CBG control to prevent long-term and short-term complications. Reviewed glucose and A1C goals. Discussed impact of nutrition, exercise, stress, sickness, and medications on diabetes control.  Explained that steroids are being tapered so glucose should improve. Explained that once she is discharged home, she will likely be asked to start monitoring glucose and perhaps taking oral DM medication if needed. Encouraged patient to follow up with Dr.  Sherwood Gambler about glycemic control.   Patient verbalized understanding of information discussed and she states that she has no further questions at this time related to diabetes.     Thanks, Orlando Penner, RN, MSN, CDE Diabetes Coordinator Inpatient Diabetes Program [phone] (Team Pager from 8am to 5pm)

## 2020-08-06 NOTE — Progress Notes (Signed)
Tele called and patient had 5 second heart pause and reverting to afib in 120's.  Contacted Dr. Gwenlyn Perking and he ordered stat EKG.  BP was 128/73 pulse 103.  C/O nausea , denied chest pain.

## 2020-08-06 NOTE — TOC Transition Note (Signed)
Transition of Care [facility]) - CM/SW Discharge Note   Patient Details  Name: Amanda Armstrong MRN: [id number] Date of Birth: [birth year]  Transition of [facility] CM/SW Contact:  Annice Needy, LCSW Phone Number: 08/06/2020, 2:18 PM   Clinical Narrative:    [name] at [facility] advised of discharge and discharge clinicals sent to facility.  [facility] transport arranged.    Final next level of care: Skilled Nursing Facility Barriers to Discharge: No Barriers Identified   Patient Goals and CMS Choice Patient states their goals for this hospitalization and ongoing recovery are:: short-term rehab   Choice offered to / list presented to : Spouse,Adult Children  Discharge Placement              Patient chooses bed at: [facility] Patient to be transferred to facility by: [facility] Name of family member notified: Daughter, [name], Patient and family notified of of transfer: 08/06/20  Discharge Plan and Services In-house Referral: Clinical Social Work   Post Acute Care Choice: Skilled Nursing Facility          DME Arranged: N/A                    Social Determinants of Health (SDOH) Interventions     Readmission Risk Interventions No flowsheet data found.

## 2020-08-07 ENCOUNTER — Encounter (HOSPITAL_COMMUNITY): Payer: Self-pay

## 2020-08-07 ENCOUNTER — Ambulatory Visit (HOSPITAL_COMMUNITY): Admission: RE | Admit: 2020-08-07 | Payer: Medicare Other | Source: Ambulatory Visit

## 2020-08-07 ENCOUNTER — Non-Acute Institutional Stay (SKILLED_NURSING_FACILITY): Payer: Medicare Other | Admitting: Internal Medicine

## 2020-08-07 ENCOUNTER — Encounter: Payer: Self-pay | Admitting: Internal Medicine

## 2020-08-07 DIAGNOSIS — G9341 Metabolic encephalopathy: Secondary | ICD-10-CM | POA: Diagnosis not present

## 2020-08-07 DIAGNOSIS — E46 Unspecified protein-calorie malnutrition: Secondary | ICD-10-CM | POA: Insufficient documentation

## 2020-08-07 DIAGNOSIS — N1832 Chronic kidney disease, stage 3b: Secondary | ICD-10-CM | POA: Diagnosis not present

## 2020-08-07 DIAGNOSIS — N179 Acute kidney failure, unspecified: Secondary | ICD-10-CM

## 2020-08-07 DIAGNOSIS — R739 Hyperglycemia, unspecified: Secondary | ICD-10-CM

## 2020-08-07 DIAGNOSIS — E440 Moderate protein-calorie malnutrition: Secondary | ICD-10-CM | POA: Diagnosis not present

## 2020-08-07 DIAGNOSIS — U071 COVID-19: Secondary | ICD-10-CM | POA: Diagnosis not present

## 2020-08-07 LAB — GLUCOSE, CAPILLARY: Glucose-Capillary: 224 mg/dL — ABNORMAL HIGH (ref 70–99)

## 2020-08-07 NOTE — Assessment & Plan Note (Signed)
Avoid nephrotoxic medications 

## 2020-08-07 NOTE — Progress Notes (Signed)
NURSING HOME LOCATION:  [facility] ROOM NUMBER:  301-A  CODE STATUS:  DNR  PCP:  [name], MD  [address] [place] [address]  This is a comprehensive admission note to [facility] performed on this date less than 30 days from date of admission. Included are preadmission medical/surgical history; reconciled medication list; family history; social history and comprehensive review of systems.  Corrections and additions to the records were documented. Comprehensive physical exam was also performed. Additionally a clinical summary was entered for each active diagnosis pertinent to this admission in the Problem List to enhance continuity of care.  HPI: The patient was hospitalized 1/17 - 08/06/2020 presenting with subjective fevers and chills.  PCP had prescribed a 7-day course of Cipro on 1/13; subjectively there was no improvement.  There is some question the patient had abdominal pain which subjectively was improving.  Unfortunately patient was a poor historian @ presentation and the spouse was reported as unable to clarify the history.   In the ED temperature was 101.3 and O2 sats were 88% on room air.  PCR for COVID-19 was positive.  White blood count was 4200.  AKI was present with a creatinine of 1.61; baseline creatinine is felt to be 1.2.  Lisinopril was held.  AST was 99 with an ALT of 10, alkaline phos of 49 and total bilirubin of 0.4.  Atorvastatin was held because of the elevated liver enzyme. Clinically she was felt to have acute metabolic encephalopathy which was multifactorial in etiology including COVID infection, hypoxia, and AKI superimposed on CKD.  Additionally benzodiazepine and opiate medications were felt to be playing a role.  Apparently she takes alprazolam once daily and receives 120 Norco 10 mg from a provider as maintenance medication.  TSH was 0.44 and B12 and folate levels were normal. She received steroids and remdesivir as per protocol  for COVID related acute respiratory failure.  This was in the context of tobacco abuse with approximately 50-pack-year history of smoking.  Incentive spirometry and flutter valve were initiated with inhaled bronchodilators as needed. Despite absence of history of diabetes hyperglycemia was present; A1c was 6.7% indicating adequate diabetic control. With rehydration there was clinical improvement but she was discharged to the SNF for PT/OT. Prednisone weaning schedule was defined at discharge.  Past medical and surgical history: Includes essential hypertension, dyslipidemia, hypothyroidism, history of vertigo, history of stroke, osteoporosis, and history of anxiety. Surgeries and procedures include TKA in 2011 and 2013.  Social history: Nondrinker,: Half a pack a day smoker prior to admission with almost 50-pack-year history of smoking.  Presently on nicotine patch.  Family history: Reviewed, noncontributory due to advanced age.   Review of systems: She knew that she had been in the hospital for COVID.  She gave the date as January [birth year] or 23, 1924 but quickly corrected year to 2022.  She states that she had the Moderna vaccines but not the booster.  She states that her congestion is better.  She does have loss of smell.  She also describes excessive urination.  Depression is a chronic issue.  She denies other COVID symptoms.  She also denies history of sleep apnea or DVT/PTE.  Constitutional: No fever, significant weight change, fatigue  Eyes: No redness, discharge, pain, vision change ENT/mouth: No purulent discharge, earache, change in hearing, sore throat  Cardiovascular: No chest pain, palpitations, paroxysmal nocturnal dyspnea, claudication, edema  Respiratory: No hemoptysis, significant snoring, apnea Gastrointestinal: No heartburn, dysphagia, abdominal pain, nausea /vomiting, rectal  bleeding, melena, change in bowels Genitourinary: No dysuria, hematuria, pyuria, incontinence,  nocturia Musculoskeletal: No joint stiffness, joint swelling, weakness, pain Dermatologic: No rash, pruritus, change in appearance of skin Neurologic: No dizziness, headache, syncope, seizures, numbness, tingling Psychiatric: No significant insomnia, anorexia Endocrine: No change in hair/skin/nails, excessive thirst, excessive hunger  Hematologic/lymphatic: No significant bruising, lymphadenopathy, abnormal bleeding Allergy/immunology: No itchy/watery eyes, significant sneezing, urticaria, angioedema  Physical exam:  Pertinent or positive findings: Affect is markedly flat and facies are blank.  Hair is disheveled.  She has a appearance of a "blue bloater"/ chronic bronchitic.  Complete dentures are present.  Chest is hyperinflated.  Breath sounds are decreased.  She does have low-grade rhonchi greater on the right than the left anteriorly.  Heart sounds are distant.  Abdomen is protuberant.  Pedal pulses are decreased.  The left upper extremity is weaker to opposition than the right upper extremity.  Lower extremity strength appears fair-good.  General appearance: Adequately nourished; no acute distress, increased work of breathing is present.   Lymphatic: No lymphadenopathy about the head, neck, axilla. Eyes: No conjunctival inflammation or lid edema is present. There is no scleral icterus. Ears:  External ear exam shows no significant lesions or deformities.   Nose:  External nasal examination shows no deformity or inflammation. Nasal mucosa are pink and moist without lesions, exudates Neck:  No thyromegaly, masses, tenderness noted.    Heart:  No gallop, murmur, click, rub.  Lungs:  without wheezes,  rales, rubs. Abdomen: Bowel sounds are normal.  Abdomen is soft and nontender with no organomegaly, hernias, masses. GU: Deferred  Extremities:  No cyanosis, clubbing, edema. Neurologic exam:  Balance, Rhomberg, finger to nose testing could not be completed due to clinical state Skin: Warm &  dry w/o tenting. No significant lesions or rash.  See clinical summary under each active problem in the Problem List with associated updated therapeutic plan

## 2020-08-07 NOTE — Assessment & Plan Note (Addendum)
Monitor for any clinical suggestion of DVT in the context of the elevated D-dimer.   Lovenox  prophylaxis with repeat D dimer 08/10/2020

## 2020-08-07 NOTE — Assessment & Plan Note (Signed)
Nutrition consult at SNF; Med Pass supplements 

## 2020-08-07 NOTE — Patient Instructions (Signed)
See assessment and plan under each diagnosis in the problem list and acutely for this visit 

## 2020-08-07 NOTE — Assessment & Plan Note (Addendum)
Single fasting glucose here at the facility was 142; evening glucose 236. Basal insulin will be initiated for the steroid exacerbated hyperglycemia if glucoses are consistently above 200.  They appear to be dropping compared to the inpatient range.

## 2020-08-07 NOTE — Assessment & Plan Note (Signed)
Today she is oriented to time and knows that she had COVID.  Affect is markedly flat and she validates chronic depression.

## 2020-08-08 LAB — CULTURE, BLOOD (ROUTINE X 2)
Culture: NO GROWTH
Culture: NO GROWTH

## 2020-08-13 LAB — BASIC METABOLIC PANEL
BUN: 20 (ref 4–21)
CO2: 24 — AB (ref 13–22)
Chloride: 105 (ref 99–108)
Creatinine: 1.1 (ref 0.5–1.1)
Glucose: 85
Potassium: 4.2 (ref 3.4–5.3)
Sodium: 138 (ref 137–147)

## 2020-08-13 LAB — COMPREHENSIVE METABOLIC PANEL
Albumin: 3 — AB (ref 3.5–5.0)
Calcium: 8.3 — AB (ref 8.7–10.7)
GFR calc Af Amer: 59.61
GFR calc non Af Amer: 51.43
Globulin: 2.2

## 2020-08-13 LAB — HEPATIC FUNCTION PANEL
ALT: 33 (ref 7–35)
AST: 23 (ref 13–35)
Alkaline Phosphatase: 47 (ref 25–125)
Bilirubin, Total: 0.6

## 2020-08-15 DIAGNOSIS — M1991 Primary osteoarthritis, unspecified site: Secondary | ICD-10-CM | POA: Diagnosis not present

## 2020-08-15 DIAGNOSIS — J449 Chronic obstructive pulmonary disease, unspecified: Secondary | ICD-10-CM | POA: Diagnosis not present

## 2020-08-15 DIAGNOSIS — Z720 Tobacco use: Secondary | ICD-10-CM | POA: Diagnosis not present

## 2020-08-15 DIAGNOSIS — M810 Age-related osteoporosis without current pathological fracture: Secondary | ICD-10-CM | POA: Diagnosis not present

## 2020-08-17 ENCOUNTER — Non-Acute Institutional Stay (SKILLED_NURSING_FACILITY): Payer: Medicare Other | Admitting: Adult Health

## 2020-08-17 ENCOUNTER — Encounter: Payer: Self-pay | Admitting: Adult Health

## 2020-08-17 DIAGNOSIS — F5101 Primary insomnia: Secondary | ICD-10-CM | POA: Diagnosis not present

## 2020-08-17 DIAGNOSIS — J449 Chronic obstructive pulmonary disease, unspecified: Secondary | ICD-10-CM | POA: Diagnosis not present

## 2020-08-17 DIAGNOSIS — K219 Gastro-esophageal reflux disease without esophagitis: Secondary | ICD-10-CM

## 2020-08-17 DIAGNOSIS — E039 Hypothyroidism, unspecified: Secondary | ICD-10-CM | POA: Diagnosis not present

## 2020-08-17 NOTE — Progress Notes (Signed)
Location:  [facility] Room Number: 301-A Place of Service:  SNF (31) Provider:  Kenard Gower, DNP, FNP-BC  Patient Care Team: [name], MD as PCP - General (Internal Medicine)  Extended Emergency Contact Information Primary Emergency Contact: Nadiyah, Zeis Address: [address]          [place], [place] [address] [place] of [place] Home Phone: [phone] Relation: Spouse Secondary Emergency Contact: Karie Kirks, [place] [address] [place] of [place] Home Phone: [phone] Relation: Daughter  Code Status:  DNR  Goals of care: Advanced Directive information Advanced Directives 08/07/2020  Does Patient Have a Medical Advance Directive? Yes  Type of Advance Directive Out of facility DNR (pink MOST or yellow form)  Does patient want to make changes to medical advance directive? No - Patient declined  Copy of Healthcare Power of Attorney in Chart? -  Would patient like information on creating a medical advance directive? -  Pre-existing out of facility DNR order (yellow form or pink MOST form) -     Chief Complaint  Patient presents with  . Medical Management of Chronic Issues    Short-term rehabilitation    HPI:  Pt is a 75 y.o. female seen today for a short-term rehabilitation visit.  She has a PMH of essential hypertension, dyslipidemia, hypothyroidism, vertigo, stroke, osteoporosis and history of anxiety. She takes levothyroxine 100 mcg daily for hypothyroidism.  NO SOB noted, has O2 sat 98% while on O2 @ 2L/min via [place].  She thinks Anoro Ellipta 62.5-25 mcg INH 1 puff into the lungs daily and levalbuterol HFA 45 mcg INH inhale 2 puffs into the lungs daily for COPD.  She was admitted to [facility] on 08/06/20 post hospitalization 08/03/2020 to 08/06/2020.  She has a PMH of stroke left hemiparesis, hypertension, hyperlipidemia, tobacco abuse, hypothyroidism and chronic pain.  She was having confusion,  generalized weakness and shortness of breath.  Spouse reported that patient has chronic coughing [place] SOB secondary to her prolonged tobacco use.  It was reported that she had subjective chills and fever.  She was prescribed ciprofloxacin by her PCP on 07/30/2020 for a 7-day course.  Antibiotic did not resolve confusion.  She presented to the ED with temperature 101.3 degrees F.  She was hypoxic with O2 saturation at 88% on room air.  Serum creatinine was 1.61 which is above her usual baseline.  PCR for COVID-19 was positive.  She received steroids and remdesivir.   Past Medical History:  Diagnosis Date  . Anxiety   . Arthritis    Bilateral Knee DJD  . Chronic pain   . Chronically on opiate therapy   . Clostridium difficile carrier    pt stated that she has intermittent sx  . Clostridium difficile infection   . Daytime somnolence   . Frequent falls   . Headache(784.0)    SINUS HEADACHE OCCASSIONALLY  . Hypertension   . Hypothyroidism   . Multifactorial gait disorder   . Osteoporosis   . Right foot drop   . Snoring   . Stroke (HCC)   . Tobacco use   . Vertigo   . Weakness    Past Surgical History:  Procedure Laterality Date  . CATARACT EXTRACTION W/PHACO Right 06/28/2016   Procedure: CATARACT EXTRACTION PHACO AND INTRAOCULAR LENS PLACEMENT (IOC);  Surgeon: Jethro Bolus, MD;  Location: [facility];  Service: Ophthalmology;  Laterality: Right;  CDE: 11.10  . CATARACT EXTRACTION W/PHACO  Left 07/26/2016   Procedure: CATARACT EXTRACTION PHACO AND INTRAOCULAR LENS PLACEMENT (IOC);  Surgeon: Jethro Bolus, MD;  Location: [facility];  Service: Ophthalmology;  Laterality: Left;  CDE: 8.88  . JOINT REPLACEMENT  11/08/3010   right total knee  . TOTAL KNEE ARTHROPLASTY  11/07/2011   Procedure: TOTAL KNEE ARTHROPLASTY;  Surgeon: Nilda Simmer, MD;  Location: [facility] OR;  Service: Orthopedics;  Laterality: Left;  DR Thurston Hole WANTS 90 MINUTES FOR THIS CASE  . TOTAL KNEE ARTHROPLASTY Right 2011  . TUBAL LIGATION   1982    Allergies  Allergen Reactions  . Iron Other (See Comments)    "upset stomach"  . Oxycodone Other (See Comments)    "CX HER TO FELL LIKE SHE IS OUT OF HER BODY"  . Sudafed [Pseudoephedrine Hcl] Other (See Comments)    "funny feeling"    Outpatient Encounter Medications as of 08/17/2020  Medication Sig  . acetaminophen (TYLENOL) 325 MG tablet Take 2 tablets (650 mg total) by mouth every 6 (six) hours as needed for up to 30 doses for mild pain or moderate pain.  [facility] Kitchen ALPRAZolam (XANAX) 1 MG tablet Take 1 tablet (1 mg total) by mouth every 8 (eight) hours as needed for anxiety.  Marland Kitchen ascorbic acid (VITAMIN C) 500 MG tablet Take 1 tablet (500 mg total) by mouth daily.  . famotidine (PEPCID) 20 MG tablet Take 1 tablet (20 mg total) by mouth 2 (two) times daily.  Mar[place] Kitchen guaiFENesin-dextromethorphan (ROBITUSSIN DM) 100-10 MG/5ML syrup Take 10 mLs by mouth every 4 (four) hours as needed for cough.  . levalbuterol (XOPENEX HFA) 45 MCG/ACT inhaler Inhale 2 puffs into the lungs 2 (two) times daily.  Mar[place] Kitchen levothyroxine (SYNTHROID) 100 MCG tablet Take 100 mcg by mouth daily before breakfast.  . meclizine (ANTIVERT) 25 MG tablet Take 1 tablet (25 mg total) by mouth 3 (three) times daily as needed for dizziness.  . nicotine (NICODERM CQ - DOSED IN MG/24 HR) 7 mg/24hr patch Place 1 patch (7 mg total) onto the skin daily.  . Nutritional Supplement LIQD Take 120 mLs by mouth daily. NAS MedPass  . OXYGEN Inhale 2 L/min into the lungs.  . terbinafine (LAMISIL) 250 MG tablet Take 250 mg by mouth every evening.  . traZODone (DESYREL) 50 MG tablet Take 50 mg by mouth at bedtime.  Marland Kitchen umeclidinium-vilanterol (ANORO ELLIPTA) 62.5-25 MCG/INH AEPB Inhale 1 puff into the lungs daily.  Marland Kitchen zinc sulfate 220 (50 Zn) MG capsule Take 1 capsule (220 mg total) by mouth daily.  . [DISCONTINUED] alendronate (FOSAMAX) 70 MG tablet Take 70 mg by mouth every Sunday.   . [DISCONTINUED] predniSONE (DELTASONE) 20 MG tablet Take 3 tablet by  mouth daily x2 days; then 2 tablets by mouth daily x2 days; then 1 tablet by mouth daily x3 days; then half tablet by mouth x3 days and stop prednisone.   No facility-administered encounter medications on file as of 08/17/2020.    Review of Systems  GENERAL: No fever or chills  MOUTH and THROAT: Denies oral discomfort, gingival pain or bleeding, pain from teeth or hoarseness   RESPIRATORY: no cough, SOB, DOE, wheezing, hemoptysis CARDIAC: No chest pain, edema or palpitations GI: No abdominal pain, diarrhea, constipation, heart burn, nausea or vomiting GU: Denies dysuria, frequency, hematuria or discharge NEUROLOGICAL: Denies dizziness, syncope, numbness, or headache PSYCHIATRIC: Denies feelings of depression or anxiety. No report of hallucinations, insomnia, paranoia, or agitation    There is no immunization history on file for this patient. Pertinent  Health Maintenance Due  Topic Date Due  . PNA vac Low Risk Adult (1 of 2 - PCV13) Never done  . INFLUENZA VACCINE  02/16/2020  . DEXA SCAN  Completed     Vitals:   08/17/20 1508  BP: 113/63  Pulse: (!) 52  Resp: 20  Temp: (!) 97.2 F (36.2 C)  TempSrc: Oral  Weight: 158 lb 6.4 oz (71.8 kg)  Height: 4\' 10"  (1.473 m)   Body mass index is 33.11 kg/m.  Physical Exam  GENERAL APPEARANCE: Well nourished. In no acute distress.  Obese SKIN:  Skin is warm and dry.  MOUTH and THROAT: Lips are without lesions. Oral mucosa is moist and without lesions.  RESPIRATORY: Breathing is even & unlabored, BS CTAB CARDIAC: RRR, no murmur,no extra heart sounds, no edema GI: Abdomen soft, normal BS, no masses, no tenderness NEUROLOGICAL: There is no tremor. Speech is clear.  PSYCHIATRIC:  Has flat affect  Labs reviewed: Recent Labs    08/04/20 0523 08/05/20 0542 08/06/20 0612 08/13/20 0000  NA 135 139 138 138  K 4.0 4.2 3.9 4.2  CL 103 105 105 105  CO2 23 26 24  24*  GLUCOSE 366* 194* 213*  --   BUN 20 21 22 20   CREATININE 1.30*  1.15* 1.08* 1.1  CALCIUM 7.9* 8.2* 8.2* 8.3*  MG 1.9 2.0 2.1  --    Recent Labs    08/04/20 0523 08/05/20 0542 08/06/20 0612 08/13/20 0000  AST 65* 45* 42* 23  ALT 80* 67* 59* 33  ALKPHOS 43 42 46 47  BILITOT 0.2* 0.2* 0.3  --   PROT 5.8* 6.0* 5.9*  --   ALBUMIN 2.7* 2.8* 2.6* 3.0*   Recent Labs    08/04/20 0523 08/05/20 0542 08/06/20 0612  WBC 3.8* 8.6 6.8  NEUTROABS 2.9 7.1 5.5  HGB 12.1 12.4 12.6  HCT 37.3 39.0 38.8  MCV 94.2 94.0 92.6  PLT 158 174 203   Lab Results  Component Value Date   TSH 0.440 08/03/2020   Lab Results  Component Value Date   HGBA1C 6.7 (H) 08/03/2020   Lab Results  Component Value Date   TRIG 103 08/03/2020    Significant Diagnostic Results in last 30 days:  CT Head Wo Contrast  Result Date: 08/03/2020 CLINICAL DATA:  Altered mental status EXAM: CT HEAD WITHOUT CONTRAST TECHNIQUE: Contiguous axial images were obtained from the base of the skull through the vertex without intravenous contrast. COMPARISON:  July 29, 2018 head CT; July 27, 2018 brain MRI FINDINGS: Brain: Stable changes of atrophy with ventricles relatively larger than sulci in proportion, stable. There is no intracranial mass, hemorrhage, extra-axial fluid collection, or midline shift. Patchy small vessel disease in the centra semiovale bilaterally is stable. There is evidence of a prior infarct in the head of the caudate nucleus on the right. No acute appearing infarct evident. Vascular: No hyperdense vessel. Calcification noted in each carotid siphon region. Skull: Bony calvarium appears intact. Sinuses/Orbits: There is mucosal thickening in several ethmoid air cells. Other visualized paranasal sinuses are clear. Orbits appear symmetric bilaterally. Other: There are opacified mastoid air cells on the right inferiorly. Mastoids elsewhere are clear. IMPRESSION: Stable atrophy with questionable superimposed degree of normal pressure hydrocephalus. There is patchy small vessel  disease in the centra semiovale bilaterally, stable. There is a prior lacunar type infarct in the head of the caudate nucleus on the right. No acute infarct evident. No mass or hemorrhage. There are foci of arterial vascular  calcification. There is mucosal thickening in several ethmoid air cells. There is mastoid air cell disease on the right inferiorly. Electronically Signed   By: Bretta Bang III M.D.   On: 08/03/2020 13:17   DG Chest Portable 1 View  Result Date: 08/03/2020 CLINICAL DATA:  Altered mental status EXAM: PORTABLE CHEST 1 VIEW COMPARISON:  December 17, 2018 FINDINGS: There is airspace opacity in the right upper lobe, right mid lung, and left base regions. Heart size and pulmonary vascularity are normal. No adenopathy. No bone lesions. IMPRESSION: Airspace opacity at several sites, consistent with multifocal pneumonia. Suspect atypical organism pneumonia. Correlation with COVID-19 status advised. No consolidation evident. Heart size normal.  No adenopathy. Electronically Signed   By: Bretta Bang III M.D.   On: 08/03/2020 10:57    Assessment/Plan  1. Hypothyroidism, unspecified type Lab Results  Component Value Date   TSH 0.440 08/03/2020   -Continue levothyroxine  2. Chronic obstructive pulmonary disease, unspecified COPD type (HCC) -    No wheezing, continue levalbuterol and Anoro Ellipta INH -     O2 2 L/minute via [place] continuously  3. Gastroesophageal reflux disease without esophagitis -    Stable, continue famotidine  4. Primary insomnia -    Continue trazodone    Family/ staff Communication:   Discussed plan of care with resident and charge nurse.  Labs/tests ordered: None  Goals of care:   Short-term care   Kenard Gower, DNP, MSN, FNP-BC [facility] [phone] (Monday-Friday 8:00 a.m. - 5:00 p.m.) [phone] (after hours)

## 2020-08-31 ENCOUNTER — Non-Acute Institutional Stay (SKILLED_NURSING_FACILITY): Payer: Medicare Other | Admitting: Adult Health

## 2020-08-31 ENCOUNTER — Encounter: Payer: Self-pay | Admitting: Adult Health

## 2020-08-31 DIAGNOSIS — U071 COVID-19: Secondary | ICD-10-CM

## 2020-08-31 DIAGNOSIS — R531 Weakness: Secondary | ICD-10-CM | POA: Diagnosis not present

## 2020-08-31 DIAGNOSIS — G9341 Metabolic encephalopathy: Secondary | ICD-10-CM | POA: Diagnosis not present

## 2020-08-31 DIAGNOSIS — Z720 Tobacco use: Secondary | ICD-10-CM

## 2020-08-31 DIAGNOSIS — N179 Acute kidney failure, unspecified: Secondary | ICD-10-CM

## 2020-08-31 DIAGNOSIS — K219 Gastro-esophageal reflux disease without esophagitis: Secondary | ICD-10-CM | POA: Diagnosis not present

## 2020-08-31 DIAGNOSIS — J449 Chronic obstructive pulmonary disease, unspecified: Secondary | ICD-10-CM

## 2020-08-31 DIAGNOSIS — N1832 Chronic kidney disease, stage 3b: Secondary | ICD-10-CM

## 2020-08-31 DIAGNOSIS — F5101 Primary insomnia: Secondary | ICD-10-CM | POA: Diagnosis not present

## 2020-08-31 DIAGNOSIS — E039 Hypothyroidism, unspecified: Secondary | ICD-10-CM

## 2020-08-31 MED ORDER — ZINC SULFATE 220 (50 ZN) MG PO CAPS: 220.0000 mg | ORAL_CAPSULE | Freq: Every day | ORAL | 0 refills | Status: DC

## 2020-08-31 MED ORDER — MECLIZINE HCL 25 MG PO TABS: 25.0000 mg | ORAL_TABLET | Freq: Three times a day (TID) | ORAL | 0 refills | Status: DC | PRN

## 2020-08-31 MED ORDER — UMECLIDINIUM-VILANTEROL 62.5-25 MCG/INH IN AEPB
1.0000 | INHALATION_SPRAY | Freq: Every day | RESPIRATORY_TRACT | 1 refills | Status: DC
Start: 2020-08-31 — End: 2021-03-25

## 2020-08-31 MED ORDER — GUAIFENESIN-DM 100-10 MG/5ML PO SYRP: 10.0000 mL | ORAL_SOLUTION | ORAL | 0 refills | Status: DC | PRN

## 2020-08-31 MED ORDER — TRAZODONE HCL 50 MG PO TABS: 50.0000 mg | ORAL_TABLET | Freq: Every day | ORAL | 0 refills | Status: DC

## 2020-08-31 MED ORDER — FAMOTIDINE 20 MG PO TABS: 20.0000 mg | ORAL_TABLET | Freq: Two times a day (BID) | ORAL | 0 refills | Status: DC

## 2020-08-31 MED ORDER — LEVOTHYROXINE SODIUM 75 MCG PO TABS: 75.0000 ug | ORAL_TABLET | Freq: Every day | ORAL | 0 refills | Status: DC

## 2020-08-31 MED ORDER — TERBINAFINE HCL 250 MG PO TABS
250.0000 mg | ORAL_TABLET | Freq: Every evening | ORAL | 0 refills | Status: DC
Start: 2020-08-31 — End: 2021-04-02

## 2020-08-31 MED ORDER — LEVALBUTEROL TARTRATE 45 MCG/ACT IN AERO: 2.0000 | INHALATION_SPRAY | Freq: Two times a day (BID) | RESPIRATORY_TRACT | 0 refills | Status: DC

## 2020-08-31 MED ORDER — TERBINAFINE HCL 250 MG PO TABS: 250.0000 mg | ORAL_TABLET | Freq: Every evening | ORAL | 0 refills | Status: DC

## 2020-08-31 MED ORDER — NICOTINE 7 MG/24HR TD PT24: 7.0000 mg | MEDICATED_PATCH | Freq: Every day | TRANSDERMAL | 0 refills | Status: DC

## 2020-08-31 MED ORDER — LEVALBUTEROL TARTRATE 45 MCG/ACT IN AERO: 2.0000 | INHALATION_SPRAY | Freq: Two times a day (BID) | RESPIRATORY_TRACT | 12 refills | Status: DC

## 2020-08-31 MED ORDER — POLYETHYLENE GLYCOL 3350 17 G PO PACK: 17.0000 g | PACK | Freq: Every day | ORAL | 0 refills | Status: DC | PRN

## 2020-08-31 MED ORDER — ACETAMINOPHEN 325 MG PO TABS: 650.0000 mg | ORAL_TABLET | Freq: Four times a day (QID) | ORAL | 0 refills | Status: DC | PRN

## 2020-08-31 MED ORDER — VITAMIN C 500 MG PO TABS: 500.0000 mg | ORAL_TABLET | Freq: Every day | ORAL | 0 refills | Status: DC

## 2020-08-31 MED ORDER — UMECLIDINIUM-VILANTEROL 62.5-25 MCG/INH IN AEPB: 1.0000 | INHALATION_SPRAY | Freq: Every day | RESPIRATORY_TRACT | 0 refills | Status: DC

## 2020-08-31 NOTE — Progress Notes (Addendum)
Location:  [facility] Room Number: 224 A Place of Service:  SNF (31) Provider:  Kenard Gower, DNP, FNP-BC  Patient Care Team: [name], MD as PCP - General (Internal Medicine) Alejandro Mulling, RN as [facility] Care Management  Extended Emergency Contact Information Primary Emergency Contact: Alphonzo Cruise Address: [address]          [place], [place] [address] Darden [place] of [place] Home Phone: [phone] Relation: Spouse Secondary Emergency Contact: Karie Kirks, [place] [address] Darden Amber of [place] Home Phone: [phone] Relation: Daughter  Code Status:  DNR  Goals of care: Advanced Directive information Advanced Directives 08/31/2020  Does Patient Have a Medical Advance Directive? -  Type of Advance Directive Out of facility DNR (pink MOST or yellow form)  Does patient want to make changes to medical advance directive? No - Patient declined  Copy of Healthcare Power of Attorney in Chart? -  Would patient like information on creating a medical advance directive? -  Pre-existing out of facility DNR order (yellow form or pink MOST form) -     Chief Complaint  Patient presents with  . Discharge Note  . Best Practice Recommendations    COVID Vaccine,Tetanus/Tdap, Pneumoniavaccine, Flu vaccine    HPI:  Pt is a 75 y.o. female who is for discharge home today, 08/31/20, with Home health PT and OT.  She was admitted to [facility] on 08/06/2020 post hospitalization 08/03/2020 to 08/06/2020.  She has a PMH of stroke with left hemiparesis, hypertension, hyperlipidemia, tobacco abuse, hypothyroidism and chronic pain. She was having confusion, generalized weakness and shortness of breath 1 week prior to hospitalization.  Spouse reported that she has chronic coughing and SOB secondary to her prolonged tobacco use..  It was also reported that she had subjective chills and fever.  She was prescribed  ciprofloxacin by her PCP on 07/30/2020 x7 days.  Antibiotic did not resolve confusion.  She presented to the ED with temperature 101.3 degrees F.  She was hypoxic with O2 saturation at 88% on room air.  Serum creatinine was 1.61 which was above her usual baseline.  PCR for COVID-19 was positive.  She received steroids and remdesivir.   Patient was admitted to this facility for short-term rehabilitation after the patient's recent hospitalization.  Patient has completed SNF rehabilitation and therapy has cleared the patient for discharge.   Past Medical History:  Diagnosis Date  . Anxiety   . Arthritis    Bilateral Knee DJD  . Chronic pain   . Chronically on opiate therapy   . Clostridium difficile carrier    pt stated that she has intermittent sx  . Clostridium difficile infection   . Daytime somnolence   . Frequent falls   . Headache(784.0)    SINUS HEADACHE OCCASSIONALLY  . Hypertension   . Hypothyroidism   . Multifactorial gait disorder   . Osteoporosis   . Right foot drop   . Snoring   . Stroke (HCC)   . Tobacco use   . Vertigo   . Weakness    Past Surgical History:  Procedure Laterality Date  . CATARACT EXTRACTION W/PHACO Right 06/28/2016   Procedure: CATARACT EXTRACTION PHACO AND INTRAOCULAR LENS PLACEMENT (IOC);  Surgeon: Jethro Bolus, MD;  Location: [facility];  Service: Ophthalmology;  Laterality: Right;  CDE: 11.10  . CATARACT EXTRACTION W/PHACO Left 07/26/2016   Procedure: CATARACT EXTRACTION PHACO AND INTRAOCULAR LENS PLACEMENT (IOC);  Surgeon: Jethro BolusMark Shapiro, MD;  Location: [facility];  Service: Ophthalmology;  Laterality: Left;  CDE: 8.88  . JOINT REPLACEMENT  11/08/3010   right total knee  . TOTAL KNEE ARTHROPLASTY  11/07/2011   Procedure: TOTAL KNEE ARTHROPLASTY;  Surgeon: Nilda Simmerobert A Wainer, MD;  Location: [facility];  Service: Orthopedics;  Laterality: Left;  DR Thurston HoleWAINER WANTS 90 MINUTES FOR THIS CASE  . TOTAL KNEE ARTHROPLASTY Right 2011  . TUBAL LIGATION  1982    Allergies   Allergen Reactions  . Iron Other (See Comments)    "upset stomach"  . Oxycodone Other (See Comments)    "CX HER TO FELL LIKE SHE IS OUT OF HER BODY"  . Sudafed [Pseudoephedrine Hcl] Other (See Comments)    "funny feeling"    Outpatient Encounter Medications as of 08/31/2020  Medication Sig  . OXYGEN Inhale 2 L/min into the lungs.  . [DISCONTINUED] acetaminophen (TYLENOL) 325 MG tablet Take 2 tablets (650 mg total) by mouth every 6 (six) hours as needed for up to 30 doses for mild pain or moderate pain.  . [DISCONTINUED] famotidine (PEPCID) 20 MG tablet Take 1 tablet (20 mg total) by mouth 2 (two) times daily.  . [DISCONTINUED] guaiFENesin-dextromethorphan (ROBITUSSIN DM) 100-10 MG/5ML syrup Take 10 mLs by mouth every 4 (four) hours as needed for cough.  . [DISCONTINUED] levalbuterol (XOPENEX HFA) 45 MCG/ACT inhaler Inhale 2 puffs into the lungs 2 (two) times daily.  . [DISCONTINUED] levothyroxine (SYNTHROID) 100 MCG tablet Take 100 mcg by mouth daily before breakfast.  . [DISCONTINUED] levothyroxine (SYNTHROID) 75 MCG tablet Take 75 mcg by mouth daily before breakfast.  . [DISCONTINUED] meclizine (ANTIVERT) 25 MG tablet Take 1 tablet (25 mg total) by mouth 3 (three) times daily as needed for dizziness. (Patient taking differently: Take 25 mg by mouth 3 (three) times daily as needed for dizziness. Every 8 hours)  . [DISCONTINUED] nicotine (NICODERM CQ - DOSED IN MG/24 HR) 7 mg/24hr patch Place 1 patch (7 mg total) onto the skin daily.  . [DISCONTINUED] polyethylene glycol (MIRALAX / GLYCOLAX) 17 g packet Take 17 g by mouth daily as needed.  . [DISCONTINUED] terbinafine (LAMISIL) 250 MG tablet Take 250 mg by mouth every evening.  . [DISCONTINUED] traZODone (DESYREL) 50 MG tablet Take 50 mg by mouth at bedtime.  . [DISCONTINUED] umeclidinium-vilanterol (ANORO ELLIPTA) 62.5-25 MCG/INH AEPB Inhale 1 puff into the lungs daily.  . [DISCONTINUED] vitamin C (ASCORBIC ACID) 500 MG tablet Take 500 mg  by mouth daily.  . [DISCONTINUED] zinc sulfate 220 (50 Zn) MG capsule Take 1 capsule (220 mg total) by mouth daily.  Marland Kitchen. acetaminophen (TYLENOL) 325 MG tablet Take 2 tablets (650 mg total) by mouth every 6 (six) hours as needed for up to 30 doses for mild pain or moderate pain.  . famotidine (PEPCID) 20 MG tablet Take 1 tablet (20 mg total) by mouth 2 (two) times daily.  Marland Kitchen. guaiFENesin-dextromethorphan (ROBITUSSIN DM) 100-10 MG/5ML syrup Take 10 mLs by mouth every 4 (four) hours as needed for cough.  . levalbuterol (XOPENEX HFA) 45 MCG/ACT inhaler Inhale 2 puffs into the lungs 2 (two) times daily.  Marland Kitchen. levothyroxine (SYNTHROID) 75 MCG tablet Take 1 tablet (75 mcg total) by mouth daily before breakfast.  . meclizine (ANTIVERT) 25 MG tablet Take 1 tablet (25 mg total) by mouth 3 (three) times daily as needed for dizziness. Every 8 hours  . nicotine (NICODERM CQ - DOSED IN MG/24 HR) 7 mg/24hr patch Place 1 patch (7 mg total) onto  the skin daily.  . polyethylene glycol (MIRALAX / GLYCOLAX) 17 g packet Take 17 g by mouth daily as needed.  . terbinafine (LAMISIL) 250 MG tablet Take 1 tablet (250 mg total) by mouth every evening.  . traZODone (DESYREL) 50 MG tablet Take 1 tablet (50 mg total) by mouth at bedtime.  Marland Kitchen umeclidinium-vilanterol (ANORO ELLIPTA) 62.5-25 MCG/INH AEPB Inhale 1 puff into the lungs daily.  . vitamin C (ASCORBIC ACID) 500 MG tablet Take 1 tablet (500 mg total) by mouth daily.  Marland Kitchen zinc sulfate 220 (50 Zn) MG capsule Take 1 capsule (220 mg total) by mouth daily.  . [DISCONTINUED] ALPRAZolam (XANAX) 1 MG tablet Take 1 tablet (1 mg total) by mouth every 8 (eight) hours as needed for anxiety.  . [DISCONTINUED] ascorbic acid (VITAMIN C) 500 MG tablet Take 1 tablet (500 mg total) by mouth daily.  . [DISCONTINUED] famotidine (PEPCID) 20 MG tablet Take 1 tablet (20 mg total) by mouth 2 (two) times daily.  . [DISCONTINUED] guaiFENesin-dextromethorphan (ROBITUSSIN DM) 100-10 MG/5ML syrup Take 10 mLs  by mouth every 4 (four) hours as needed for cough.  . [DISCONTINUED] levalbuterol (XOPENEX HFA) 45 MCG/ACT inhaler Inhale 2 puffs into the lungs 2 (two) times daily.  . [DISCONTINUED] levothyroxine (SYNTHROID) 75 MCG tablet Take 1 tablet (75 mcg total) by mouth daily before breakfast.  . [DISCONTINUED] nicotine (NICODERM CQ - DOSED IN MG/24 HR) 7 mg/24hr patch Place 1 patch (7 mg total) onto the skin daily.  . [DISCONTINUED] Nutritional Supplement LIQD Take 120 mLs by mouth daily. NAS MedPass  . [DISCONTINUED] terbinafine (LAMISIL) 250 MG tablet Take 1 tablet (250 mg total) by mouth every evening.  . [DISCONTINUED] traZODone (DESYREL) 50 MG tablet Take 1 tablet (50 mg total) by mouth at bedtime.  . [DISCONTINUED] umeclidinium-vilanterol (ANORO ELLIPTA) 62.5-25 MCG/INH AEPB Inhale 1 puff into the lungs daily.  . [DISCONTINUED] zinc sulfate 220 (50 Zn) MG capsule Take 1 capsule (220 mg total) by mouth daily.   No facility-administered encounter medications on file as of 08/31/2020.    Review of Systems  GENERAL: No change in appetite, no fatigue, no weight changes, no fever, chills or weakness MOUTH and THROAT: Denies oral discomfort, gingival pain or bleeding RESPIRATORY: no cough, SOB, DOE, wheezing, hemoptysis CARDIAC: No chest pain, edema or palpitations GI: No abdominal pain, diarrhea, constipation, heart burn, nausea or vomiting GU: Denies dysuria, frequency, hematuria, incontinence, or discharge NEUROLOGICAL: Denies dizziness, syncope, numbness, or headache PSYCHIATRIC: Denies feelings of depression or anxiety. No report of hallucinations, insomnia, paranoia, or agitation    There is no immunization history on file for this patient. Pertinent  Health Maintenance Due  Topic Date Due  . PNA vac Low Risk Adult (1 of 2 - PCV13) Never done  . INFLUENZA VACCINE  02/16/2020  . DEXA SCAN  Completed   No flowsheet data found.   Vitals:   08/31/20 1439  BP: 118/64  Pulse: 77  Resp:  17  Temp: 98.1 F (36.7 C)  Weight: 151 lb 9.6 oz (68.8 kg)  Height: 4\' 10"  (1.473 m)   Body mass index is 31.68 kg/m.  Physical Exam  GENERAL APPEARANCE: Well nourished. In no acute distress. Obese SKIN:  Skin is warm and dry.  MOUTH and THROAT: Lips are without lesions. Oral mucosa is moist and without lesions. Tongue is normal in shape, size, and color and without lesions RESPIRATORY: Breathing is even & unlabored, BS CTAB CARDIAC: RRR, no murmur,no extra heart sounds, no edema GI:  Abdomen soft, normal BS, no masses, no tenderness EXTREMITIES:  Able to move X 4 extremities NEUROLOGICAL: There is no tremor. Speech is clear. Alert to self and place, disoriented to time. PSYCHIATRIC:  Affect and behavior are appropriate  Labs reviewed: Recent Labs    08/04/20 0523 08/05/20 0542 08/06/20 0612 08/13/20 0000  NA 135 139 138 138  K 4.0 4.2 3.9 4.2  CL 103 105 105 105  CO2 24*  GLUCOSE 366* 194* 213*  --   BUN CREATININE 1.30* 1.15* 1.08* 1.1  CALCIUM 7.9* 8.2* 8.2* 8.3*  MG 1.9 2.0 2.1  --    Recent Labs    08/04/20 0523 08/05/20 0542 08/06/20 0612 08/13/20 0000  AST 65* 45* 42* 23  ALT 80* 67* 59* 33  ALKPHOS 43 42 46 47  BILITOT 0.2* 0.2* 0.3  --   PROT 5.8* 6.0* 5.9*  --   ALBUMIN 2.7* 2.8* 2.6* 3.0*   Recent Labs    08/04/20 0523 08/05/20 0542 08/06/20 0612  WBC 3.8* 8.6 6.8  NEUTROABS 2.9 7.1 5.5  HGB 12.1 12.4 12.6  HCT 37.3 39.0 38.8  MCV 94.2 94.0 92.6  PLT 158 174 203   Lab Results  Component Value Date   TSH 0.440 08/03/2020   Lab Results  Component Value Date   HGBA1C 6.7 (H) 08/03/2020   Lab Results  Component Value Date   TRIG 103 08/03/2020    Significant Diagnostic Results in last 30 days:  No results found.  Assessment/Plan  1. COVID-19 -    Completed remdesivir infusion and oral steroids -    Now requiring O2 at 2 L/minute via [place] continuously - guaiFENesin-dextromethorphan (ROBITUSSIN DM) 100-10  MG/5ML syrup; Take 10 mLs by mouth every 4 (four) hours as needed for cough.  Dispense: 118 mL; Refill: 0 - vitamin C (ASCORBIC ACID) 500 MG tablet; Take 1 tablet (500 mg total) by mouth daily.  Dispense: 30 tablet; Refill: 0 - zinc sulfate 220 (50 Zn) MG capsule; Take 1 capsule (220 mg total) by mouth daily.  Dispense: 30 capsule; Refill: 0  2. Acute renal failure superimposed on stage 3b chronic kidney disease, unspecified acute renal failure type [facility]) Lab Results  Component Value Date   NA 138 08/13/2020   K 4.2 08/13/2020   CO2 24 (A) 08/13/2020   GLUCOSE 213 (H) 08/06/2020   BUN 20 08/13/2020   CREATININE 1.1 08/13/2020   CALCIUM 8.3 (A) 08/13/2020   GFRNONAA 51.43 08/13/2020   GFRAA 59.61 08/13/2020   -   Creatinine now at baseline -  Torsemide and Lisinopril were held in the hospital and at [facility], BPs stable, SBPs ranging from 117 to 126  3. Acute metabolic encephalopathy - thought to be multifactorial, infectious process, hypoxia, acute renal failure and possibly effects from her benzathine opiates -She is discharging with  4. Chronic obstructive pulmonary disease, unspecified COPD type (HCC) - levalbuterol (XOPENEX HFA) 45 MCG/ACT inhaler; Inhale 2 puffs into the lungs 2 (two) times daily.  Dispense: 1 each; Refill: 12 - umeclidinium-vilanterol (ANORO ELLIPTA) 62.5-25 MCG/INH AEPB; Inhale 1 puff into the lungs daily.  Dispense: 1 each; Refill: 0  5. Hypothyroidism, unspecified type Lab Results  Component Value Date   TSH 0.440 08/03/2020   - levothyroxine (SYNTHROID) 75 MCG tablet; Take 1 tablet (75 mcg total) by mouth daily before breakfast.  Dispense: 30 tablet; Refill: 0  6. Gastroesophageal reflux disease without esophagitis - famotidine (PEPCID) 20  MG tablet; Take 1 tablet (20 mg total) by mouth 2 (two) times daily.  Dispense: 60 tablet; Refill: 0  7. Primary insomnia - traZODone (DESYREL) 50 MG tablet; Take 1 tablet (50 mg total) by mouth at bedtime.   Dispense: 30 tablet; Refill: 0  8. Tobacco use - nicotine (NICODERM CQ - DOSED IN MG/24 HR) 7 mg/24hr patch; Place 1 patch (7 mg total) onto the skin daily.  Dispense: 28 patch; Refill: 0  9.  Generalized weakness -    For home health PT and OT, for therapeutic strengthening exercises      I have filled out patient's discharge paperwork and e-prescribed medications.  Patient will receive home health PT and OT.  DME provided: Wheelchair and O2 at 2 L/minute via [place]  Wheelchair  -patient suffers from generalized weakness due to COVID-19 infection which impairs her ability to perform daily activities like toileting, feeding, dressing, grooming and hygiene in the home.  A cane or walker will not resolve issue with performing activities of daily living.  A wheelchair will allow patient to safely perform daily activities.  Patient can safely propel the wheelchair in the home.  O2 sat at room air is 85% and O2 sat on 2 L O2 via [place] is 96%    Total discharge time: Greater than 30 minutes Greater than 50% was spent in counseling and coordination of care.   Discharge time involved coordination of the discharge process with social worker, nursing staff and therapy department. Medical justification for home health services/DME verified.    Kenard Gower, DNP, MSN, FNP-BC [facility] [phone] (Monday-Friday 8:00 a.m. - 5:00 p.m.) [phone] (after hours) .

## 2020-09-01 DIAGNOSIS — U071 COVID-19: Secondary | ICD-10-CM | POA: Diagnosis not present

## 2020-09-01 DIAGNOSIS — J9601 Acute respiratory failure with hypoxia: Secondary | ICD-10-CM | POA: Diagnosis not present

## 2020-09-01 DIAGNOSIS — J449 Chronic obstructive pulmonary disease, unspecified: Secondary | ICD-10-CM | POA: Diagnosis not present

## 2020-09-04 DIAGNOSIS — E440 Moderate protein-calorie malnutrition: Secondary | ICD-10-CM | POA: Diagnosis not present

## 2020-09-04 DIAGNOSIS — M6281 Muscle weakness (generalized): Secondary | ICD-10-CM | POA: Diagnosis not present

## 2020-09-07 ENCOUNTER — Other Ambulatory Visit: Payer: Self-pay | Admitting: *Deleted

## 2020-09-07 NOTE — Patient Outreach (Signed)
[facility]) Care Management  09/07/2020  Amanda Armstrong [birth year] [id number]   EMMI- GENERAL DISCHARGE-UNSUCCESSFUL RED ON EMMI ALERT Day #4 Date: 09/05/2020 Red Alert Reason: SAD/HOPELESS/ANXIOUS/EMPTY  OUTREACH #1 RN attempted outreach call however unsuccessful. RN able to leave a HIPAA approved voice message.  Will attempted another outreach call over the next few days and send an outreach letter for a response.  Elliot Cousin, RN Care Management Coordinator [facility] Main Office [phone]

## 2020-09-08 DIAGNOSIS — B9789 Other viral agents as the cause of diseases classified elsewhere: Secondary | ICD-10-CM | POA: Diagnosis not present

## 2020-09-08 DIAGNOSIS — E7849 Other hyperlipidemia: Secondary | ICD-10-CM | POA: Diagnosis not present

## 2020-09-08 DIAGNOSIS — E039 Hypothyroidism, unspecified: Secondary | ICD-10-CM | POA: Diagnosis not present

## 2020-09-08 DIAGNOSIS — R739 Hyperglycemia, unspecified: Secondary | ICD-10-CM | POA: Diagnosis not present

## 2020-09-08 DIAGNOSIS — Z0000 Encounter for general adult medical examination without abnormal findings: Secondary | ICD-10-CM | POA: Diagnosis not present

## 2020-09-08 DIAGNOSIS — Z0001 Encounter for general adult medical examination with abnormal findings: Secondary | ICD-10-CM | POA: Diagnosis not present

## 2020-09-08 DIAGNOSIS — E782 Mixed hyperlipidemia: Secondary | ICD-10-CM | POA: Diagnosis not present

## 2020-09-08 DIAGNOSIS — R6889 Other general symptoms and signs: Secondary | ICD-10-CM | POA: Diagnosis not present

## 2020-09-08 DIAGNOSIS — Z681 Body mass index (BMI) 19 or less, adult: Secondary | ICD-10-CM | POA: Diagnosis not present

## 2020-09-08 DIAGNOSIS — Z1389 Encounter for screening for other disorder: Secondary | ICD-10-CM | POA: Diagnosis not present

## 2020-09-10 ENCOUNTER — Other Ambulatory Visit: Payer: Self-pay | Admitting: *Deleted

## 2020-09-10 NOTE — Patient Outreach (Signed)
[facility]) Care Management  09/10/2020  Amanda Armstrong [birth year] [id number]   EMMI-GENERAL DISCHARGE-SUCCESSFUL RED ON EMMI ALERT Day #4 Date: 09/05/2020 Red Alert Reason: SAD/HOPELESS/ANXIOUS/EMPTY  OUTREACH #1 RN spoke with pt today concerning the above emmi. Pt denies any sucidical thoughts and declined contact to her provider for possible counseling. Pt also declined any counseling via [facility] social worker with no additional symptoms.  Inquired and offered [facility] services for any community resources via Child psychotherapist or pharmacy services. Pt declined an indicated she her spoken with her provided on the request for PT services. No other needs to address at this time. Informed pt [facility] services remains available for any additional needs that may arise.  Case will be closed with no additional needs.  Elliot Cousin, RN Care Management Coordinator [facility] Main Office [phone]

## 2020-09-14 DIAGNOSIS — M810 Age-related osteoporosis without current pathological fracture: Secondary | ICD-10-CM | POA: Diagnosis not present

## 2020-09-14 DIAGNOSIS — M1991 Primary osteoarthritis, unspecified site: Secondary | ICD-10-CM | POA: Diagnosis not present

## 2020-09-14 DIAGNOSIS — J449 Chronic obstructive pulmonary disease, unspecified: Secondary | ICD-10-CM | POA: Diagnosis not present

## 2020-09-14 DIAGNOSIS — Z720 Tobacco use: Secondary | ICD-10-CM | POA: Diagnosis not present

## 2020-09-17 DIAGNOSIS — N183 Chronic kidney disease, stage 3 unspecified: Secondary | ICD-10-CM | POA: Diagnosis not present

## 2020-09-17 DIAGNOSIS — E063 Autoimmune thyroiditis: Secondary | ICD-10-CM | POA: Diagnosis not present

## 2020-09-17 DIAGNOSIS — M159 Polyosteoarthritis, unspecified: Secondary | ICD-10-CM | POA: Diagnosis not present

## 2020-09-17 DIAGNOSIS — F17210 Nicotine dependence, cigarettes, uncomplicated: Secondary | ICD-10-CM | POA: Diagnosis not present

## 2020-09-17 DIAGNOSIS — M810 Age-related osteoporosis without current pathological fracture: Secondary | ICD-10-CM | POA: Diagnosis not present

## 2020-09-17 DIAGNOSIS — R5383 Other fatigue: Secondary | ICD-10-CM | POA: Diagnosis not present

## 2020-09-17 DIAGNOSIS — J449 Chronic obstructive pulmonary disease, unspecified: Secondary | ICD-10-CM | POA: Diagnosis not present

## 2020-09-21 DIAGNOSIS — J449 Chronic obstructive pulmonary disease, unspecified: Secondary | ICD-10-CM | POA: Diagnosis not present

## 2020-09-21 DIAGNOSIS — E063 Autoimmune thyroiditis: Secondary | ICD-10-CM | POA: Diagnosis not present

## 2020-09-21 DIAGNOSIS — M159 Polyosteoarthritis, unspecified: Secondary | ICD-10-CM | POA: Diagnosis not present

## 2020-09-21 DIAGNOSIS — F17210 Nicotine dependence, cigarettes, uncomplicated: Secondary | ICD-10-CM | POA: Diagnosis not present

## 2020-09-21 DIAGNOSIS — M810 Age-related osteoporosis without current pathological fracture: Secondary | ICD-10-CM | POA: Diagnosis not present

## 2020-09-21 DIAGNOSIS — R5383 Other fatigue: Secondary | ICD-10-CM | POA: Diagnosis not present

## 2020-09-21 DIAGNOSIS — N183 Chronic kidney disease, stage 3 unspecified: Secondary | ICD-10-CM | POA: Diagnosis not present

## 2020-09-23 DIAGNOSIS — N183 Chronic kidney disease, stage 3 unspecified: Secondary | ICD-10-CM | POA: Diagnosis not present

## 2020-09-23 DIAGNOSIS — M159 Polyosteoarthritis, unspecified: Secondary | ICD-10-CM | POA: Diagnosis not present

## 2020-09-23 DIAGNOSIS — E063 Autoimmune thyroiditis: Secondary | ICD-10-CM | POA: Diagnosis not present

## 2020-09-23 DIAGNOSIS — M810 Age-related osteoporosis without current pathological fracture: Secondary | ICD-10-CM | POA: Diagnosis not present

## 2020-09-23 DIAGNOSIS — J449 Chronic obstructive pulmonary disease, unspecified: Secondary | ICD-10-CM | POA: Diagnosis not present

## 2020-09-23 DIAGNOSIS — F17210 Nicotine dependence, cigarettes, uncomplicated: Secondary | ICD-10-CM | POA: Diagnosis not present

## 2020-09-23 DIAGNOSIS — R5383 Other fatigue: Secondary | ICD-10-CM | POA: Diagnosis not present

## 2020-09-28 DIAGNOSIS — F17210 Nicotine dependence, cigarettes, uncomplicated: Secondary | ICD-10-CM | POA: Diagnosis not present

## 2020-09-28 DIAGNOSIS — M810 Age-related osteoporosis without current pathological fracture: Secondary | ICD-10-CM | POA: Diagnosis not present

## 2020-09-28 DIAGNOSIS — N183 Chronic kidney disease, stage 3 unspecified: Secondary | ICD-10-CM | POA: Diagnosis not present

## 2020-09-28 DIAGNOSIS — E063 Autoimmune thyroiditis: Secondary | ICD-10-CM | POA: Diagnosis not present

## 2020-09-28 DIAGNOSIS — J449 Chronic obstructive pulmonary disease, unspecified: Secondary | ICD-10-CM | POA: Diagnosis not present

## 2020-09-28 DIAGNOSIS — R5383 Other fatigue: Secondary | ICD-10-CM | POA: Diagnosis not present

## 2020-09-28 DIAGNOSIS — M159 Polyosteoarthritis, unspecified: Secondary | ICD-10-CM | POA: Diagnosis not present

## 2020-09-29 DIAGNOSIS — N183 Chronic kidney disease, stage 3 unspecified: Secondary | ICD-10-CM | POA: Diagnosis not present

## 2020-09-29 DIAGNOSIS — E063 Autoimmune thyroiditis: Secondary | ICD-10-CM | POA: Diagnosis not present

## 2020-09-29 DIAGNOSIS — M159 Polyosteoarthritis, unspecified: Secondary | ICD-10-CM | POA: Diagnosis not present

## 2020-09-30 DIAGNOSIS — M810 Age-related osteoporosis without current pathological fracture: Secondary | ICD-10-CM | POA: Diagnosis not present

## 2020-09-30 DIAGNOSIS — F17210 Nicotine dependence, cigarettes, uncomplicated: Secondary | ICD-10-CM | POA: Diagnosis not present

## 2020-09-30 DIAGNOSIS — J449 Chronic obstructive pulmonary disease, unspecified: Secondary | ICD-10-CM | POA: Diagnosis not present

## 2020-09-30 DIAGNOSIS — M159 Polyosteoarthritis, unspecified: Secondary | ICD-10-CM | POA: Diagnosis not present

## 2020-09-30 DIAGNOSIS — N183 Chronic kidney disease, stage 3 unspecified: Secondary | ICD-10-CM | POA: Diagnosis not present

## 2020-09-30 DIAGNOSIS — R5383 Other fatigue: Secondary | ICD-10-CM | POA: Diagnosis not present

## 2020-09-30 DIAGNOSIS — E063 Autoimmune thyroiditis: Secondary | ICD-10-CM | POA: Diagnosis not present

## 2020-10-02 DIAGNOSIS — E440 Moderate protein-calorie malnutrition: Secondary | ICD-10-CM | POA: Diagnosis not present

## 2020-10-05 DIAGNOSIS — F17210 Nicotine dependence, cigarettes, uncomplicated: Secondary | ICD-10-CM | POA: Diagnosis not present

## 2020-10-05 DIAGNOSIS — R5383 Other fatigue: Secondary | ICD-10-CM | POA: Diagnosis not present

## 2020-10-05 DIAGNOSIS — M810 Age-related osteoporosis without current pathological fracture: Secondary | ICD-10-CM | POA: Diagnosis not present

## 2020-10-05 DIAGNOSIS — M159 Polyosteoarthritis, unspecified: Secondary | ICD-10-CM | POA: Diagnosis not present

## 2020-10-05 DIAGNOSIS — N183 Chronic kidney disease, stage 3 unspecified: Secondary | ICD-10-CM | POA: Diagnosis not present

## 2020-10-05 DIAGNOSIS — J449 Chronic obstructive pulmonary disease, unspecified: Secondary | ICD-10-CM | POA: Diagnosis not present

## 2020-10-05 DIAGNOSIS — E063 Autoimmune thyroiditis: Secondary | ICD-10-CM | POA: Diagnosis not present

## 2020-10-07 DIAGNOSIS — G894 Chronic pain syndrome: Secondary | ICD-10-CM | POA: Diagnosis not present

## 2020-10-08 DIAGNOSIS — M159 Polyosteoarthritis, unspecified: Secondary | ICD-10-CM | POA: Diagnosis not present

## 2020-10-08 DIAGNOSIS — M810 Age-related osteoporosis without current pathological fracture: Secondary | ICD-10-CM | POA: Diagnosis not present

## 2020-10-08 DIAGNOSIS — N183 Chronic kidney disease, stage 3 unspecified: Secondary | ICD-10-CM | POA: Diagnosis not present

## 2020-10-08 DIAGNOSIS — J449 Chronic obstructive pulmonary disease, unspecified: Secondary | ICD-10-CM | POA: Diagnosis not present

## 2020-10-08 DIAGNOSIS — E063 Autoimmune thyroiditis: Secondary | ICD-10-CM | POA: Diagnosis not present

## 2020-10-08 DIAGNOSIS — R5383 Other fatigue: Secondary | ICD-10-CM | POA: Diagnosis not present

## 2020-10-08 DIAGNOSIS — F17210 Nicotine dependence, cigarettes, uncomplicated: Secondary | ICD-10-CM | POA: Diagnosis not present

## 2020-10-14 DIAGNOSIS — R5383 Other fatigue: Secondary | ICD-10-CM | POA: Diagnosis not present

## 2020-10-14 DIAGNOSIS — Z720 Tobacco use: Secondary | ICD-10-CM | POA: Diagnosis not present

## 2020-10-14 DIAGNOSIS — M159 Polyosteoarthritis, unspecified: Secondary | ICD-10-CM | POA: Diagnosis not present

## 2020-10-14 DIAGNOSIS — F17210 Nicotine dependence, cigarettes, uncomplicated: Secondary | ICD-10-CM | POA: Diagnosis not present

## 2020-10-14 DIAGNOSIS — E063 Autoimmune thyroiditis: Secondary | ICD-10-CM | POA: Diagnosis not present

## 2020-10-14 DIAGNOSIS — N183 Chronic kidney disease, stage 3 unspecified: Secondary | ICD-10-CM | POA: Diagnosis not present

## 2020-10-14 DIAGNOSIS — J449 Chronic obstructive pulmonary disease, unspecified: Secondary | ICD-10-CM | POA: Diagnosis not present

## 2020-10-14 DIAGNOSIS — M810 Age-related osteoporosis without current pathological fracture: Secondary | ICD-10-CM | POA: Diagnosis not present

## 2020-10-16 DIAGNOSIS — N183 Chronic kidney disease, stage 3 unspecified: Secondary | ICD-10-CM | POA: Diagnosis not present

## 2020-10-16 DIAGNOSIS — F17210 Nicotine dependence, cigarettes, uncomplicated: Secondary | ICD-10-CM | POA: Diagnosis not present

## 2020-10-16 DIAGNOSIS — M159 Polyosteoarthritis, unspecified: Secondary | ICD-10-CM | POA: Diagnosis not present

## 2020-10-16 DIAGNOSIS — U071 COVID-19: Secondary | ICD-10-CM | POA: Diagnosis not present

## 2020-10-16 DIAGNOSIS — J9601 Acute respiratory failure with hypoxia: Secondary | ICD-10-CM | POA: Diagnosis not present

## 2020-10-16 DIAGNOSIS — J449 Chronic obstructive pulmonary disease, unspecified: Secondary | ICD-10-CM | POA: Diagnosis not present

## 2020-10-16 DIAGNOSIS — M810 Age-related osteoporosis without current pathological fracture: Secondary | ICD-10-CM | POA: Diagnosis not present

## 2020-10-16 DIAGNOSIS — E063 Autoimmune thyroiditis: Secondary | ICD-10-CM | POA: Diagnosis not present

## 2020-10-16 DIAGNOSIS — R5383 Other fatigue: Secondary | ICD-10-CM | POA: Diagnosis not present

## 2020-10-19 DIAGNOSIS — M159 Polyosteoarthritis, unspecified: Secondary | ICD-10-CM | POA: Diagnosis not present

## 2020-10-19 DIAGNOSIS — J449 Chronic obstructive pulmonary disease, unspecified: Secondary | ICD-10-CM | POA: Diagnosis not present

## 2020-10-19 DIAGNOSIS — E063 Autoimmune thyroiditis: Secondary | ICD-10-CM | POA: Diagnosis not present

## 2020-10-19 DIAGNOSIS — R5383 Other fatigue: Secondary | ICD-10-CM | POA: Diagnosis not present

## 2020-10-19 DIAGNOSIS — N183 Chronic kidney disease, stage 3 unspecified: Secondary | ICD-10-CM | POA: Diagnosis not present

## 2020-10-19 DIAGNOSIS — M810 Age-related osteoporosis without current pathological fracture: Secondary | ICD-10-CM | POA: Diagnosis not present

## 2020-10-19 DIAGNOSIS — F17210 Nicotine dependence, cigarettes, uncomplicated: Secondary | ICD-10-CM | POA: Diagnosis not present

## 2020-10-24 ENCOUNTER — Other Ambulatory Visit: Payer: Self-pay | Admitting: Adult Health

## 2020-10-24 DIAGNOSIS — J449 Chronic obstructive pulmonary disease, unspecified: Secondary | ICD-10-CM

## 2020-10-26 DIAGNOSIS — R5383 Other fatigue: Secondary | ICD-10-CM | POA: Diagnosis not present

## 2020-10-26 DIAGNOSIS — M810 Age-related osteoporosis without current pathological fracture: Secondary | ICD-10-CM | POA: Diagnosis not present

## 2020-10-26 DIAGNOSIS — J449 Chronic obstructive pulmonary disease, unspecified: Secondary | ICD-10-CM | POA: Diagnosis not present

## 2020-10-26 DIAGNOSIS — F17210 Nicotine dependence, cigarettes, uncomplicated: Secondary | ICD-10-CM | POA: Diagnosis not present

## 2020-10-26 DIAGNOSIS — M159 Polyosteoarthritis, unspecified: Secondary | ICD-10-CM | POA: Diagnosis not present

## 2020-10-26 DIAGNOSIS — N183 Chronic kidney disease, stage 3 unspecified: Secondary | ICD-10-CM | POA: Diagnosis not present

## 2020-10-26 DIAGNOSIS — E063 Autoimmune thyroiditis: Secondary | ICD-10-CM | POA: Diagnosis not present

## 2020-11-02 DIAGNOSIS — M159 Polyosteoarthritis, unspecified: Secondary | ICD-10-CM | POA: Diagnosis not present

## 2020-11-02 DIAGNOSIS — F17210 Nicotine dependence, cigarettes, uncomplicated: Secondary | ICD-10-CM | POA: Diagnosis not present

## 2020-11-02 DIAGNOSIS — R5383 Other fatigue: Secondary | ICD-10-CM | POA: Diagnosis not present

## 2020-11-02 DIAGNOSIS — N183 Chronic kidney disease, stage 3 unspecified: Secondary | ICD-10-CM | POA: Diagnosis not present

## 2020-11-02 DIAGNOSIS — J449 Chronic obstructive pulmonary disease, unspecified: Secondary | ICD-10-CM | POA: Diagnosis not present

## 2020-11-02 DIAGNOSIS — E063 Autoimmune thyroiditis: Secondary | ICD-10-CM | POA: Diagnosis not present

## 2020-11-02 DIAGNOSIS — M810 Age-related osteoporosis without current pathological fracture: Secondary | ICD-10-CM | POA: Diagnosis not present

## 2020-11-12 DIAGNOSIS — F17210 Nicotine dependence, cigarettes, uncomplicated: Secondary | ICD-10-CM | POA: Diagnosis not present

## 2020-11-12 DIAGNOSIS — J449 Chronic obstructive pulmonary disease, unspecified: Secondary | ICD-10-CM | POA: Diagnosis not present

## 2020-11-12 DIAGNOSIS — E063 Autoimmune thyroiditis: Secondary | ICD-10-CM | POA: Diagnosis not present

## 2020-11-12 DIAGNOSIS — N183 Chronic kidney disease, stage 3 unspecified: Secondary | ICD-10-CM | POA: Diagnosis not present

## 2020-11-12 DIAGNOSIS — R5383 Other fatigue: Secondary | ICD-10-CM | POA: Diagnosis not present

## 2020-11-12 DIAGNOSIS — M159 Polyosteoarthritis, unspecified: Secondary | ICD-10-CM | POA: Diagnosis not present

## 2020-11-12 DIAGNOSIS — M810 Age-related osteoporosis without current pathological fracture: Secondary | ICD-10-CM | POA: Diagnosis not present

## 2020-11-14 DIAGNOSIS — J449 Chronic obstructive pulmonary disease, unspecified: Secondary | ICD-10-CM | POA: Diagnosis not present

## 2020-11-14 DIAGNOSIS — E7849 Other hyperlipidemia: Secondary | ICD-10-CM | POA: Diagnosis not present

## 2020-11-14 DIAGNOSIS — Z720 Tobacco use: Secondary | ICD-10-CM | POA: Diagnosis not present

## 2020-11-16 DIAGNOSIS — R2681 Unsteadiness on feet: Secondary | ICD-10-CM | POA: Diagnosis not present

## 2020-11-16 DIAGNOSIS — E063 Autoimmune thyroiditis: Secondary | ICD-10-CM | POA: Diagnosis not present

## 2020-11-16 DIAGNOSIS — M810 Age-related osteoporosis without current pathological fracture: Secondary | ICD-10-CM | POA: Diagnosis not present

## 2020-11-16 DIAGNOSIS — F17210 Nicotine dependence, cigarettes, uncomplicated: Secondary | ICD-10-CM | POA: Diagnosis not present

## 2020-11-16 DIAGNOSIS — J449 Chronic obstructive pulmonary disease, unspecified: Secondary | ICD-10-CM | POA: Diagnosis not present

## 2020-11-16 DIAGNOSIS — M159 Polyosteoarthritis, unspecified: Secondary | ICD-10-CM | POA: Diagnosis not present

## 2020-11-16 DIAGNOSIS — R5383 Other fatigue: Secondary | ICD-10-CM | POA: Diagnosis not present

## 2020-11-16 DIAGNOSIS — N183 Chronic kidney disease, stage 3 unspecified: Secondary | ICD-10-CM | POA: Diagnosis not present

## 2020-11-18 DIAGNOSIS — M159 Polyosteoarthritis, unspecified: Secondary | ICD-10-CM | POA: Diagnosis not present

## 2020-11-18 DIAGNOSIS — F17210 Nicotine dependence, cigarettes, uncomplicated: Secondary | ICD-10-CM | POA: Diagnosis not present

## 2020-11-18 DIAGNOSIS — N183 Chronic kidney disease, stage 3 unspecified: Secondary | ICD-10-CM | POA: Diagnosis not present

## 2020-11-18 DIAGNOSIS — M810 Age-related osteoporosis without current pathological fracture: Secondary | ICD-10-CM | POA: Diagnosis not present

## 2020-11-18 DIAGNOSIS — E063 Autoimmune thyroiditis: Secondary | ICD-10-CM | POA: Diagnosis not present

## 2020-11-18 DIAGNOSIS — J449 Chronic obstructive pulmonary disease, unspecified: Secondary | ICD-10-CM | POA: Diagnosis not present

## 2020-11-18 DIAGNOSIS — R5383 Other fatigue: Secondary | ICD-10-CM | POA: Diagnosis not present

## 2020-11-18 DIAGNOSIS — R2681 Unsteadiness on feet: Secondary | ICD-10-CM | POA: Diagnosis not present

## 2020-11-23 DIAGNOSIS — M159 Polyosteoarthritis, unspecified: Secondary | ICD-10-CM | POA: Diagnosis not present

## 2020-11-23 DIAGNOSIS — R2681 Unsteadiness on feet: Secondary | ICD-10-CM | POA: Diagnosis not present

## 2020-11-23 DIAGNOSIS — E063 Autoimmune thyroiditis: Secondary | ICD-10-CM | POA: Diagnosis not present

## 2020-11-23 DIAGNOSIS — F17210 Nicotine dependence, cigarettes, uncomplicated: Secondary | ICD-10-CM | POA: Diagnosis not present

## 2020-11-23 DIAGNOSIS — N183 Chronic kidney disease, stage 3 unspecified: Secondary | ICD-10-CM | POA: Diagnosis not present

## 2020-11-23 DIAGNOSIS — M810 Age-related osteoporosis without current pathological fracture: Secondary | ICD-10-CM | POA: Diagnosis not present

## 2020-11-23 DIAGNOSIS — R5383 Other fatigue: Secondary | ICD-10-CM | POA: Diagnosis not present

## 2020-11-23 DIAGNOSIS — J449 Chronic obstructive pulmonary disease, unspecified: Secondary | ICD-10-CM | POA: Diagnosis not present

## 2020-11-25 DIAGNOSIS — M159 Polyosteoarthritis, unspecified: Secondary | ICD-10-CM | POA: Diagnosis not present

## 2020-11-25 DIAGNOSIS — R5383 Other fatigue: Secondary | ICD-10-CM | POA: Diagnosis not present

## 2020-11-25 DIAGNOSIS — M810 Age-related osteoporosis without current pathological fracture: Secondary | ICD-10-CM | POA: Diagnosis not present

## 2020-11-25 DIAGNOSIS — E063 Autoimmune thyroiditis: Secondary | ICD-10-CM | POA: Diagnosis not present

## 2020-11-25 DIAGNOSIS — R2681 Unsteadiness on feet: Secondary | ICD-10-CM | POA: Diagnosis not present

## 2020-11-25 DIAGNOSIS — N183 Chronic kidney disease, stage 3 unspecified: Secondary | ICD-10-CM | POA: Diagnosis not present

## 2020-11-25 DIAGNOSIS — F17210 Nicotine dependence, cigarettes, uncomplicated: Secondary | ICD-10-CM | POA: Diagnosis not present

## 2020-11-25 DIAGNOSIS — J449 Chronic obstructive pulmonary disease, unspecified: Secondary | ICD-10-CM | POA: Diagnosis not present

## 2020-11-28 DIAGNOSIS — J449 Chronic obstructive pulmonary disease, unspecified: Secondary | ICD-10-CM | POA: Diagnosis not present

## 2020-11-28 DIAGNOSIS — U071 COVID-19: Secondary | ICD-10-CM | POA: Diagnosis not present

## 2020-11-28 DIAGNOSIS — J9601 Acute respiratory failure with hypoxia: Secondary | ICD-10-CM | POA: Diagnosis not present

## 2020-11-29 DIAGNOSIS — J9601 Acute respiratory failure with hypoxia: Secondary | ICD-10-CM | POA: Diagnosis not present

## 2020-11-29 DIAGNOSIS — U071 COVID-19: Secondary | ICD-10-CM | POA: Diagnosis not present

## 2020-11-29 DIAGNOSIS — J449 Chronic obstructive pulmonary disease, unspecified: Secondary | ICD-10-CM | POA: Diagnosis not present

## 2020-12-01 DIAGNOSIS — F17210 Nicotine dependence, cigarettes, uncomplicated: Secondary | ICD-10-CM | POA: Diagnosis not present

## 2020-12-01 DIAGNOSIS — R2681 Unsteadiness on feet: Secondary | ICD-10-CM | POA: Diagnosis not present

## 2020-12-01 DIAGNOSIS — M159 Polyosteoarthritis, unspecified: Secondary | ICD-10-CM | POA: Diagnosis not present

## 2020-12-01 DIAGNOSIS — E063 Autoimmune thyroiditis: Secondary | ICD-10-CM | POA: Diagnosis not present

## 2020-12-01 DIAGNOSIS — G894 Chronic pain syndrome: Secondary | ICD-10-CM | POA: Diagnosis not present

## 2020-12-01 DIAGNOSIS — R5383 Other fatigue: Secondary | ICD-10-CM | POA: Diagnosis not present

## 2020-12-01 DIAGNOSIS — M810 Age-related osteoporosis without current pathological fracture: Secondary | ICD-10-CM | POA: Diagnosis not present

## 2020-12-01 DIAGNOSIS — J449 Chronic obstructive pulmonary disease, unspecified: Secondary | ICD-10-CM | POA: Diagnosis not present

## 2020-12-01 DIAGNOSIS — N183 Chronic kidney disease, stage 3 unspecified: Secondary | ICD-10-CM | POA: Diagnosis not present

## 2020-12-03 DIAGNOSIS — R2681 Unsteadiness on feet: Secondary | ICD-10-CM | POA: Diagnosis not present

## 2020-12-03 DIAGNOSIS — M159 Polyosteoarthritis, unspecified: Secondary | ICD-10-CM | POA: Diagnosis not present

## 2020-12-03 DIAGNOSIS — N183 Chronic kidney disease, stage 3 unspecified: Secondary | ICD-10-CM | POA: Diagnosis not present

## 2020-12-03 DIAGNOSIS — E063 Autoimmune thyroiditis: Secondary | ICD-10-CM | POA: Diagnosis not present

## 2020-12-03 DIAGNOSIS — R5383 Other fatigue: Secondary | ICD-10-CM | POA: Diagnosis not present

## 2020-12-03 DIAGNOSIS — F17210 Nicotine dependence, cigarettes, uncomplicated: Secondary | ICD-10-CM | POA: Diagnosis not present

## 2020-12-03 DIAGNOSIS — M810 Age-related osteoporosis without current pathological fracture: Secondary | ICD-10-CM | POA: Diagnosis not present

## 2020-12-03 DIAGNOSIS — J449 Chronic obstructive pulmonary disease, unspecified: Secondary | ICD-10-CM | POA: Diagnosis not present

## 2020-12-08 DIAGNOSIS — N183 Chronic kidney disease, stage 3 unspecified: Secondary | ICD-10-CM | POA: Diagnosis not present

## 2020-12-08 DIAGNOSIS — E063 Autoimmune thyroiditis: Secondary | ICD-10-CM | POA: Diagnosis not present

## 2020-12-08 DIAGNOSIS — M159 Polyosteoarthritis, unspecified: Secondary | ICD-10-CM | POA: Diagnosis not present

## 2020-12-08 DIAGNOSIS — M810 Age-related osteoporosis without current pathological fracture: Secondary | ICD-10-CM | POA: Diagnosis not present

## 2020-12-08 DIAGNOSIS — R2681 Unsteadiness on feet: Secondary | ICD-10-CM | POA: Diagnosis not present

## 2020-12-08 DIAGNOSIS — J449 Chronic obstructive pulmonary disease, unspecified: Secondary | ICD-10-CM | POA: Diagnosis not present

## 2020-12-08 DIAGNOSIS — R5383 Other fatigue: Secondary | ICD-10-CM | POA: Diagnosis not present

## 2020-12-08 DIAGNOSIS — F17210 Nicotine dependence, cigarettes, uncomplicated: Secondary | ICD-10-CM | POA: Diagnosis not present

## 2020-12-10 DIAGNOSIS — F17210 Nicotine dependence, cigarettes, uncomplicated: Secondary | ICD-10-CM | POA: Diagnosis not present

## 2020-12-10 DIAGNOSIS — M159 Polyosteoarthritis, unspecified: Secondary | ICD-10-CM | POA: Diagnosis not present

## 2020-12-10 DIAGNOSIS — M810 Age-related osteoporosis without current pathological fracture: Secondary | ICD-10-CM | POA: Diagnosis not present

## 2020-12-10 DIAGNOSIS — N183 Chronic kidney disease, stage 3 unspecified: Secondary | ICD-10-CM | POA: Diagnosis not present

## 2020-12-10 DIAGNOSIS — J449 Chronic obstructive pulmonary disease, unspecified: Secondary | ICD-10-CM | POA: Diagnosis not present

## 2020-12-10 DIAGNOSIS — E063 Autoimmune thyroiditis: Secondary | ICD-10-CM | POA: Diagnosis not present

## 2020-12-10 DIAGNOSIS — R5383 Other fatigue: Secondary | ICD-10-CM | POA: Diagnosis not present

## 2020-12-10 DIAGNOSIS — R2681 Unsteadiness on feet: Secondary | ICD-10-CM | POA: Diagnosis not present

## 2020-12-12 IMAGING — DX CHEST - 2 VIEW
2 series · 2 of 2 positions shown · non-contrast
Comparison: Radiographs July 29, 2018

CLINICAL DATA: Weakness.

EXAM:
CHEST - 2 VIEW

[chest pa]
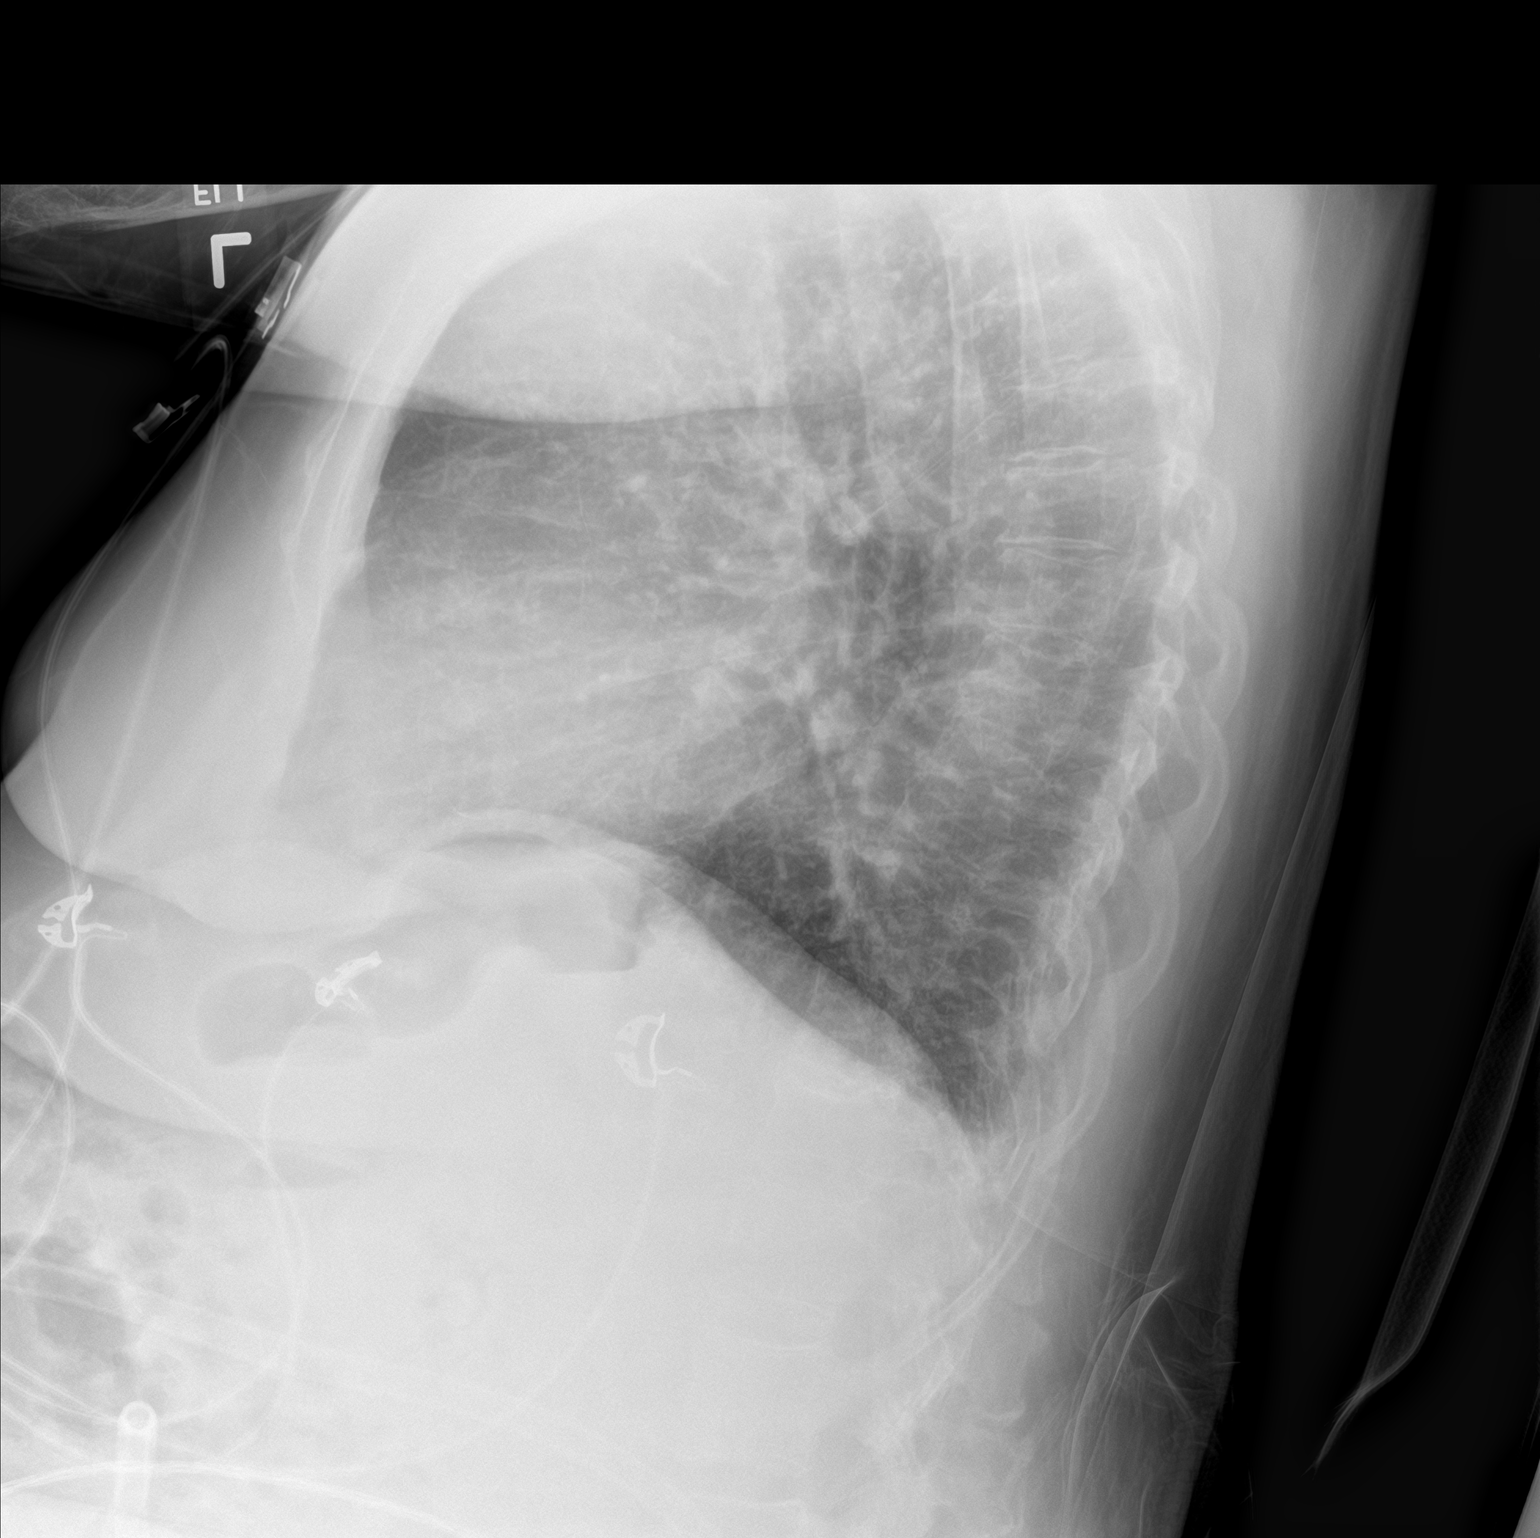

[chest ap]
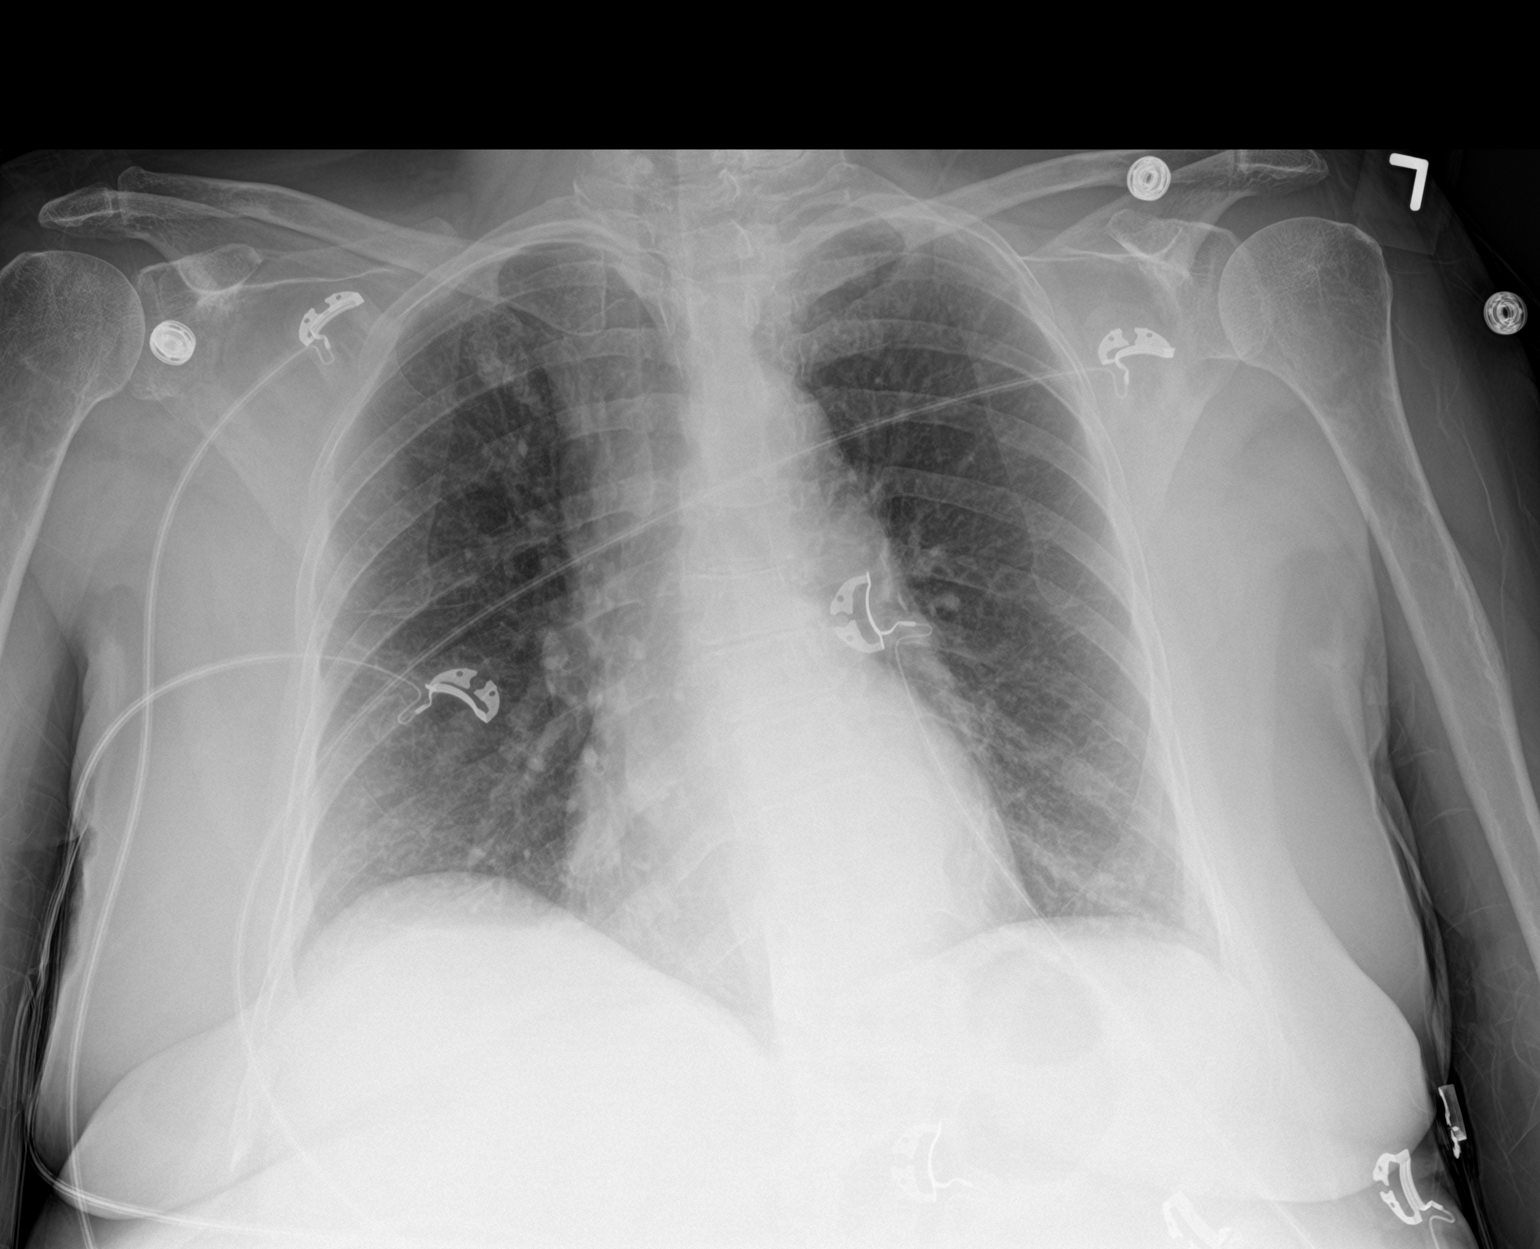

[2 of 2 positions shown; findings below may reference images not displayed]

FINDINGS: The heart size and mediastinal contours are within normal limits.
Both lungs are clear. The visualized skeletal structures are
unremarkable.
IMPRESSION: No active cardiopulmonary disease.

## 2020-12-15 DIAGNOSIS — N183 Chronic kidney disease, stage 3 unspecified: Secondary | ICD-10-CM | POA: Diagnosis not present

## 2020-12-15 DIAGNOSIS — M810 Age-related osteoporosis without current pathological fracture: Secondary | ICD-10-CM | POA: Diagnosis not present

## 2020-12-15 DIAGNOSIS — F17210 Nicotine dependence, cigarettes, uncomplicated: Secondary | ICD-10-CM | POA: Diagnosis not present

## 2020-12-15 DIAGNOSIS — R5383 Other fatigue: Secondary | ICD-10-CM | POA: Diagnosis not present

## 2020-12-15 DIAGNOSIS — J449 Chronic obstructive pulmonary disease, unspecified: Secondary | ICD-10-CM | POA: Diagnosis not present

## 2020-12-15 DIAGNOSIS — E063 Autoimmune thyroiditis: Secondary | ICD-10-CM | POA: Diagnosis not present

## 2020-12-15 DIAGNOSIS — Z720 Tobacco use: Secondary | ICD-10-CM | POA: Diagnosis not present

## 2020-12-15 DIAGNOSIS — R2681 Unsteadiness on feet: Secondary | ICD-10-CM | POA: Diagnosis not present

## 2020-12-15 DIAGNOSIS — M159 Polyosteoarthritis, unspecified: Secondary | ICD-10-CM | POA: Diagnosis not present

## 2020-12-15 DIAGNOSIS — E7849 Other hyperlipidemia: Secondary | ICD-10-CM | POA: Diagnosis not present

## 2020-12-17 DIAGNOSIS — E063 Autoimmune thyroiditis: Secondary | ICD-10-CM | POA: Diagnosis not present

## 2020-12-17 DIAGNOSIS — R5383 Other fatigue: Secondary | ICD-10-CM | POA: Diagnosis not present

## 2020-12-17 DIAGNOSIS — M159 Polyosteoarthritis, unspecified: Secondary | ICD-10-CM | POA: Diagnosis not present

## 2020-12-17 DIAGNOSIS — F17210 Nicotine dependence, cigarettes, uncomplicated: Secondary | ICD-10-CM | POA: Diagnosis not present

## 2020-12-17 DIAGNOSIS — J449 Chronic obstructive pulmonary disease, unspecified: Secondary | ICD-10-CM | POA: Diagnosis not present

## 2020-12-17 DIAGNOSIS — R2681 Unsteadiness on feet: Secondary | ICD-10-CM | POA: Diagnosis not present

## 2020-12-17 DIAGNOSIS — N183 Chronic kidney disease, stage 3 unspecified: Secondary | ICD-10-CM | POA: Diagnosis not present

## 2020-12-17 DIAGNOSIS — M810 Age-related osteoporosis without current pathological fracture: Secondary | ICD-10-CM | POA: Diagnosis not present

## 2020-12-21 DIAGNOSIS — N183 Chronic kidney disease, stage 3 unspecified: Secondary | ICD-10-CM | POA: Diagnosis not present

## 2020-12-21 DIAGNOSIS — E063 Autoimmune thyroiditis: Secondary | ICD-10-CM | POA: Diagnosis not present

## 2020-12-21 DIAGNOSIS — M810 Age-related osteoporosis without current pathological fracture: Secondary | ICD-10-CM | POA: Diagnosis not present

## 2020-12-21 DIAGNOSIS — F17210 Nicotine dependence, cigarettes, uncomplicated: Secondary | ICD-10-CM | POA: Diagnosis not present

## 2020-12-21 DIAGNOSIS — M159 Polyosteoarthritis, unspecified: Secondary | ICD-10-CM | POA: Diagnosis not present

## 2020-12-21 DIAGNOSIS — J449 Chronic obstructive pulmonary disease, unspecified: Secondary | ICD-10-CM | POA: Diagnosis not present

## 2020-12-21 DIAGNOSIS — R2681 Unsteadiness on feet: Secondary | ICD-10-CM | POA: Diagnosis not present

## 2020-12-21 DIAGNOSIS — R5383 Other fatigue: Secondary | ICD-10-CM | POA: Diagnosis not present

## 2020-12-28 DIAGNOSIS — M810 Age-related osteoporosis without current pathological fracture: Secondary | ICD-10-CM | POA: Diagnosis not present

## 2020-12-28 DIAGNOSIS — R5383 Other fatigue: Secondary | ICD-10-CM | POA: Diagnosis not present

## 2020-12-28 DIAGNOSIS — J449 Chronic obstructive pulmonary disease, unspecified: Secondary | ICD-10-CM | POA: Diagnosis not present

## 2020-12-28 DIAGNOSIS — R2681 Unsteadiness on feet: Secondary | ICD-10-CM | POA: Diagnosis not present

## 2020-12-28 DIAGNOSIS — M159 Polyosteoarthritis, unspecified: Secondary | ICD-10-CM | POA: Diagnosis not present

## 2020-12-28 DIAGNOSIS — F17210 Nicotine dependence, cigarettes, uncomplicated: Secondary | ICD-10-CM | POA: Diagnosis not present

## 2020-12-28 DIAGNOSIS — N183 Chronic kidney disease, stage 3 unspecified: Secondary | ICD-10-CM | POA: Diagnosis not present

## 2020-12-28 DIAGNOSIS — E063 Autoimmune thyroiditis: Secondary | ICD-10-CM | POA: Diagnosis not present

## 2020-12-29 DIAGNOSIS — U071 COVID-19: Secondary | ICD-10-CM | POA: Diagnosis not present

## 2020-12-29 DIAGNOSIS — J9601 Acute respiratory failure with hypoxia: Secondary | ICD-10-CM | POA: Diagnosis not present

## 2020-12-29 DIAGNOSIS — J449 Chronic obstructive pulmonary disease, unspecified: Secondary | ICD-10-CM | POA: Diagnosis not present

## 2020-12-30 DIAGNOSIS — U071 COVID-19: Secondary | ICD-10-CM | POA: Diagnosis not present

## 2020-12-30 DIAGNOSIS — J9601 Acute respiratory failure with hypoxia: Secondary | ICD-10-CM | POA: Diagnosis not present

## 2020-12-30 DIAGNOSIS — J449 Chronic obstructive pulmonary disease, unspecified: Secondary | ICD-10-CM | POA: Diagnosis not present

## 2021-01-05 DIAGNOSIS — R5383 Other fatigue: Secondary | ICD-10-CM | POA: Diagnosis not present

## 2021-01-05 DIAGNOSIS — F17210 Nicotine dependence, cigarettes, uncomplicated: Secondary | ICD-10-CM | POA: Diagnosis not present

## 2021-01-05 DIAGNOSIS — E063 Autoimmune thyroiditis: Secondary | ICD-10-CM | POA: Diagnosis not present

## 2021-01-05 DIAGNOSIS — M159 Polyosteoarthritis, unspecified: Secondary | ICD-10-CM | POA: Diagnosis not present

## 2021-01-05 DIAGNOSIS — J449 Chronic obstructive pulmonary disease, unspecified: Secondary | ICD-10-CM | POA: Diagnosis not present

## 2021-01-05 DIAGNOSIS — N183 Chronic kidney disease, stage 3 unspecified: Secondary | ICD-10-CM | POA: Diagnosis not present

## 2021-01-05 DIAGNOSIS — M810 Age-related osteoporosis without current pathological fracture: Secondary | ICD-10-CM | POA: Diagnosis not present

## 2021-01-05 DIAGNOSIS — R2681 Unsteadiness on feet: Secondary | ICD-10-CM | POA: Diagnosis not present

## 2021-01-06 IMAGING — DX DG HIP (WITH OR WITHOUT PELVIS) 2-3V RIGHT
3 series · 3 of 3 positions shown · non-contrast
Comparison: Right hip x-rays dated January 30, 2016.

CLINICAL DATA: Acute right hip pain.  No injury.

EXAM:
DG HIP (WITH OR WITHOUT PELVIS) 2-3V RIGHT

[hip ap]
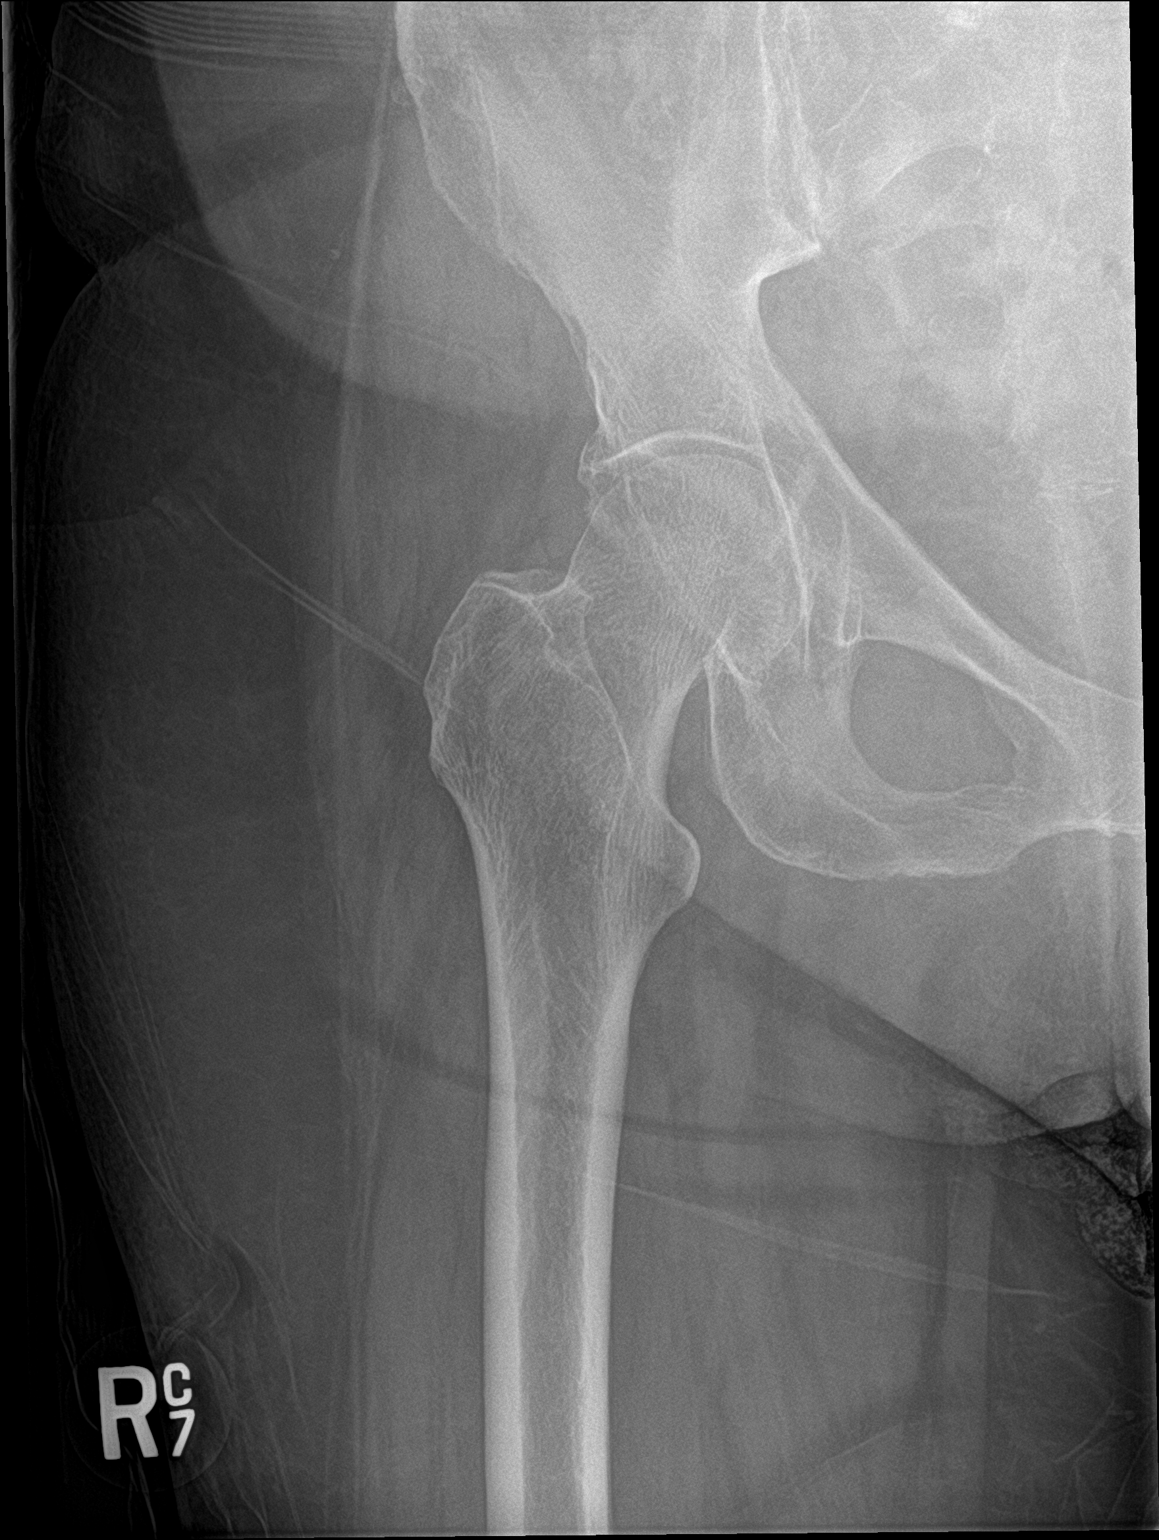

[hip lat]
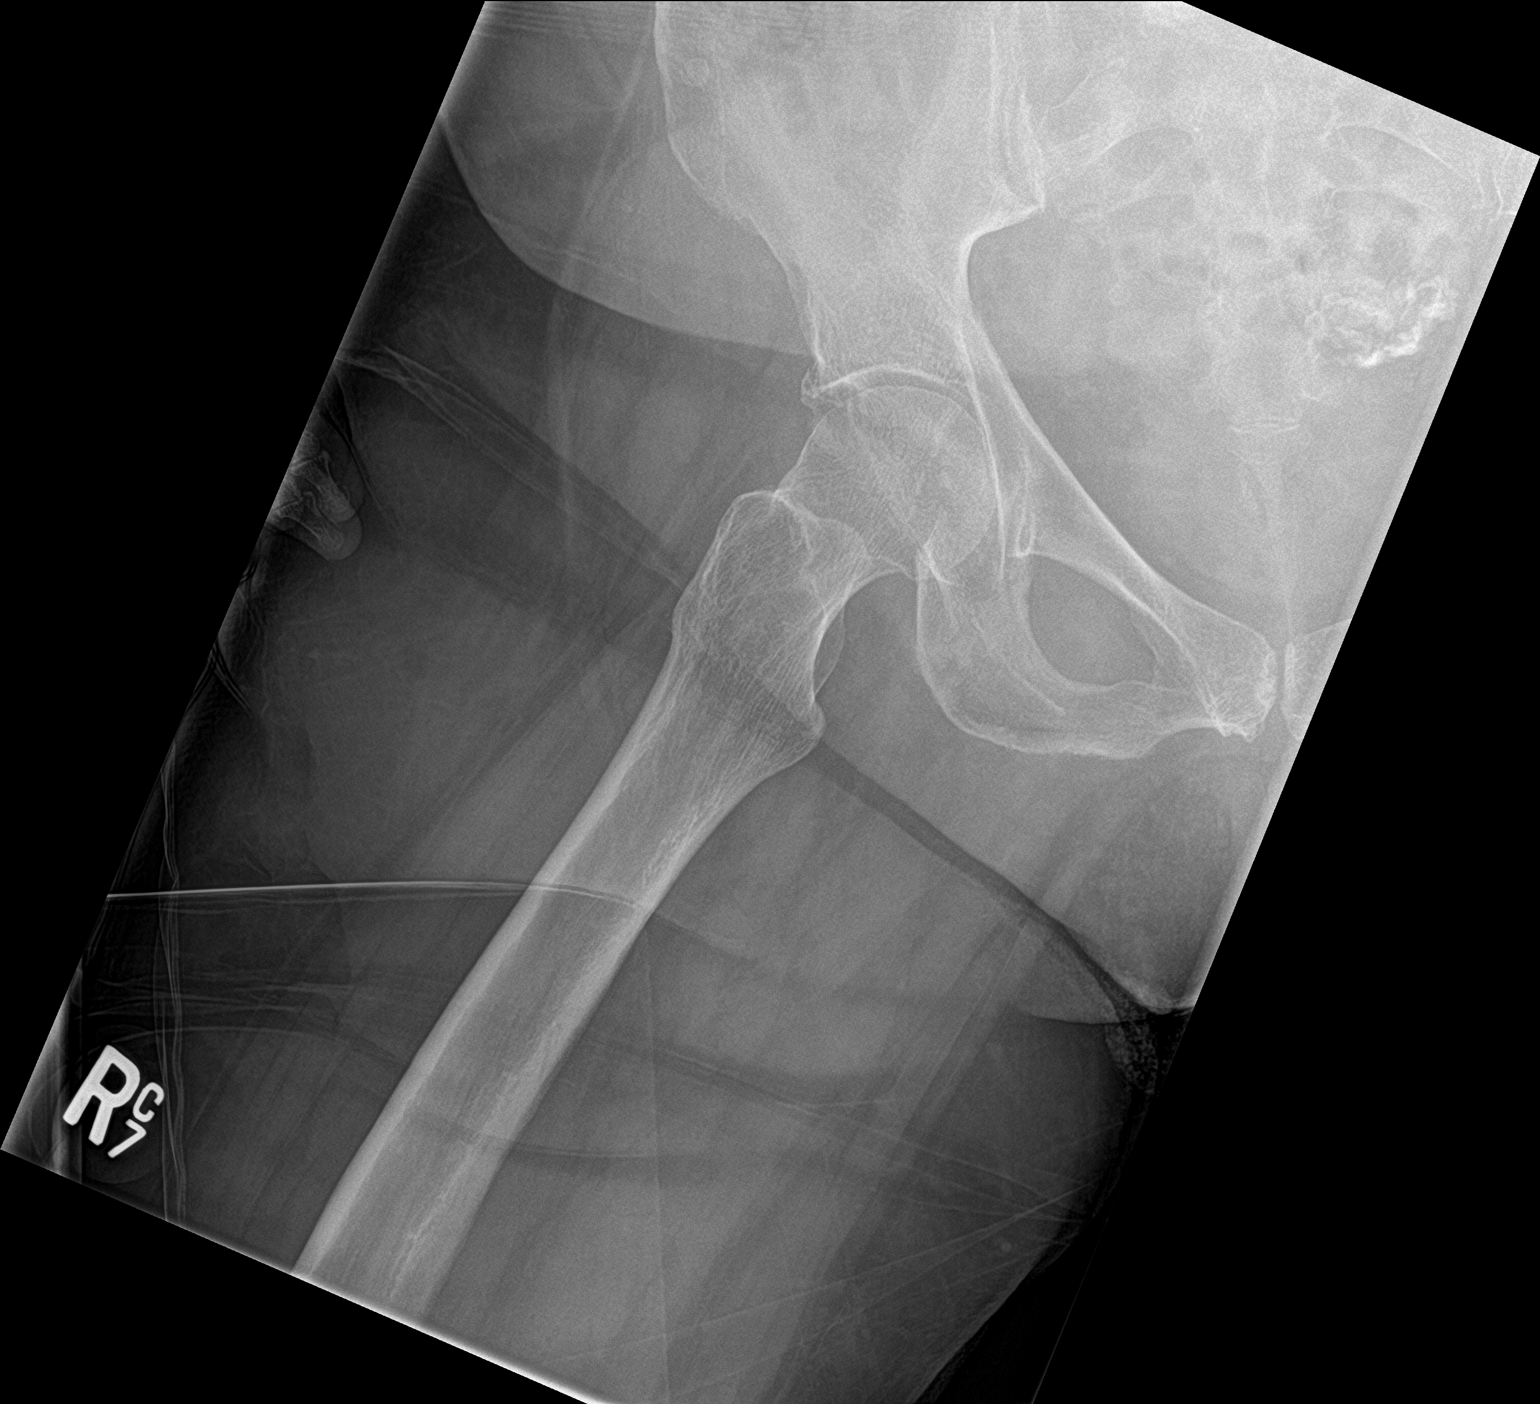

[pelvis ap]
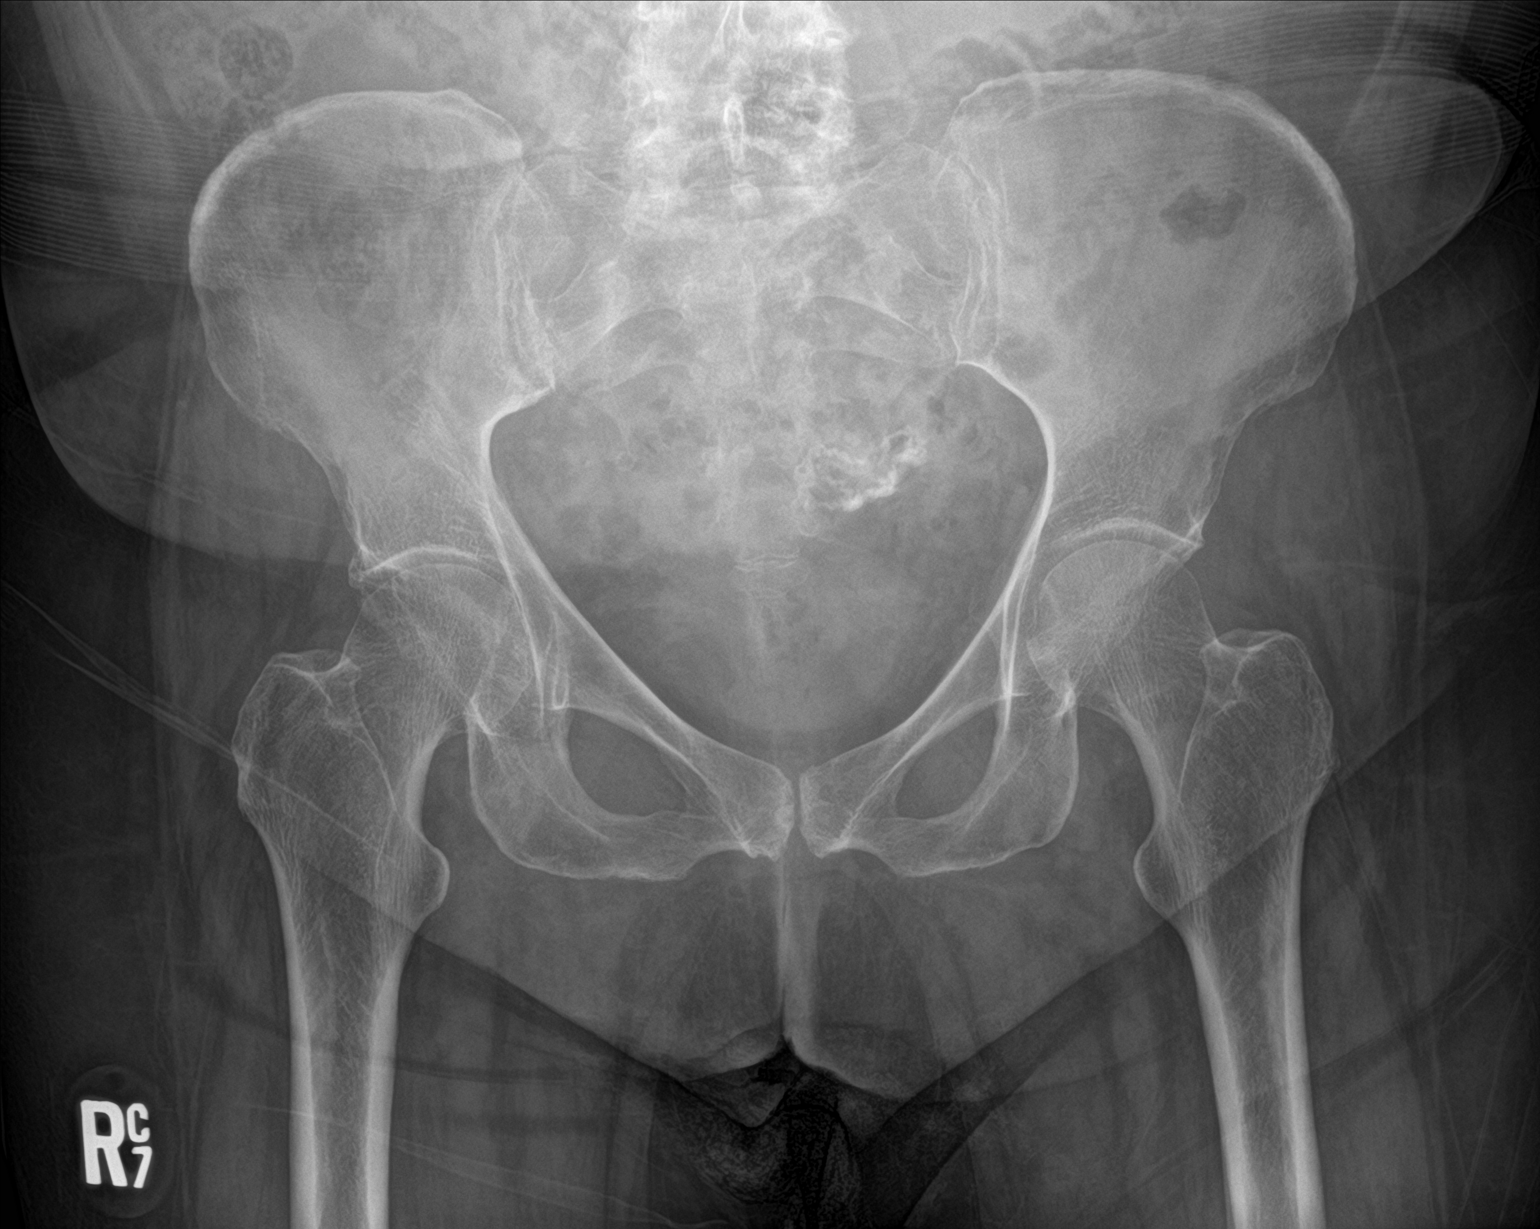

[3 of 3 positions shown; findings below may reference images not displayed]

FINDINGS: There is no evidence of hip fracture or dislocation. There is no
evidence of arthropathy or other focal bone abnormality. Osteopenia.
Soft tissues are unremarkable.
IMPRESSION: No acute osseous abnormality or significant degenerative changes.

## 2021-01-11 DIAGNOSIS — F17210 Nicotine dependence, cigarettes, uncomplicated: Secondary | ICD-10-CM | POA: Diagnosis not present

## 2021-01-11 DIAGNOSIS — M810 Age-related osteoporosis without current pathological fracture: Secondary | ICD-10-CM | POA: Diagnosis not present

## 2021-01-11 DIAGNOSIS — R2681 Unsteadiness on feet: Secondary | ICD-10-CM | POA: Diagnosis not present

## 2021-01-11 DIAGNOSIS — E063 Autoimmune thyroiditis: Secondary | ICD-10-CM | POA: Diagnosis not present

## 2021-01-11 DIAGNOSIS — G894 Chronic pain syndrome: Secondary | ICD-10-CM | POA: Diagnosis not present

## 2021-01-11 DIAGNOSIS — J449 Chronic obstructive pulmonary disease, unspecified: Secondary | ICD-10-CM | POA: Diagnosis not present

## 2021-01-11 DIAGNOSIS — N183 Chronic kidney disease, stage 3 unspecified: Secondary | ICD-10-CM | POA: Diagnosis not present

## 2021-01-11 DIAGNOSIS — R5383 Other fatigue: Secondary | ICD-10-CM | POA: Diagnosis not present

## 2021-01-11 DIAGNOSIS — M159 Polyosteoarthritis, unspecified: Secondary | ICD-10-CM | POA: Diagnosis not present

## 2021-01-11 DIAGNOSIS — M1991 Primary osteoarthritis, unspecified site: Secondary | ICD-10-CM | POA: Diagnosis not present

## 2021-01-12 DIAGNOSIS — M159 Polyosteoarthritis, unspecified: Secondary | ICD-10-CM | POA: Diagnosis not present

## 2021-01-12 DIAGNOSIS — N183 Chronic kidney disease, stage 3 unspecified: Secondary | ICD-10-CM | POA: Diagnosis not present

## 2021-01-12 DIAGNOSIS — E063 Autoimmune thyroiditis: Secondary | ICD-10-CM | POA: Diagnosis not present

## 2021-01-14 DIAGNOSIS — J449 Chronic obstructive pulmonary disease, unspecified: Secondary | ICD-10-CM | POA: Diagnosis not present

## 2021-01-14 DIAGNOSIS — Z720 Tobacco use: Secondary | ICD-10-CM | POA: Diagnosis not present

## 2021-01-14 DIAGNOSIS — E7849 Other hyperlipidemia: Secondary | ICD-10-CM | POA: Diagnosis not present

## 2021-01-15 DIAGNOSIS — U099 Post covid-19 condition, unspecified: Secondary | ICD-10-CM | POA: Diagnosis not present

## 2021-01-15 DIAGNOSIS — F17210 Nicotine dependence, cigarettes, uncomplicated: Secondary | ICD-10-CM | POA: Diagnosis not present

## 2021-01-15 DIAGNOSIS — R5383 Other fatigue: Secondary | ICD-10-CM | POA: Diagnosis not present

## 2021-01-15 DIAGNOSIS — R2681 Unsteadiness on feet: Secondary | ICD-10-CM | POA: Diagnosis not present

## 2021-01-15 DIAGNOSIS — N183 Chronic kidney disease, stage 3 unspecified: Secondary | ICD-10-CM | POA: Diagnosis not present

## 2021-01-15 DIAGNOSIS — J449 Chronic obstructive pulmonary disease, unspecified: Secondary | ICD-10-CM | POA: Diagnosis not present

## 2021-01-15 DIAGNOSIS — E063 Autoimmune thyroiditis: Secondary | ICD-10-CM | POA: Diagnosis not present

## 2021-01-15 DIAGNOSIS — M810 Age-related osteoporosis without current pathological fracture: Secondary | ICD-10-CM | POA: Diagnosis not present

## 2021-01-15 DIAGNOSIS — M159 Polyosteoarthritis, unspecified: Secondary | ICD-10-CM | POA: Diagnosis not present

## 2021-01-20 DIAGNOSIS — J449 Chronic obstructive pulmonary disease, unspecified: Secondary | ICD-10-CM | POA: Diagnosis not present

## 2021-01-20 DIAGNOSIS — R5383 Other fatigue: Secondary | ICD-10-CM | POA: Diagnosis not present

## 2021-01-20 DIAGNOSIS — M810 Age-related osteoporosis without current pathological fracture: Secondary | ICD-10-CM | POA: Diagnosis not present

## 2021-01-20 DIAGNOSIS — N183 Chronic kidney disease, stage 3 unspecified: Secondary | ICD-10-CM | POA: Diagnosis not present

## 2021-01-20 DIAGNOSIS — E063 Autoimmune thyroiditis: Secondary | ICD-10-CM | POA: Diagnosis not present

## 2021-01-20 DIAGNOSIS — U099 Post covid-19 condition, unspecified: Secondary | ICD-10-CM | POA: Diagnosis not present

## 2021-01-20 DIAGNOSIS — R2681 Unsteadiness on feet: Secondary | ICD-10-CM | POA: Diagnosis not present

## 2021-01-20 DIAGNOSIS — F17210 Nicotine dependence, cigarettes, uncomplicated: Secondary | ICD-10-CM | POA: Diagnosis not present

## 2021-01-20 DIAGNOSIS — M159 Polyosteoarthritis, unspecified: Secondary | ICD-10-CM | POA: Diagnosis not present

## 2021-01-22 DIAGNOSIS — F17210 Nicotine dependence, cigarettes, uncomplicated: Secondary | ICD-10-CM | POA: Diagnosis not present

## 2021-01-22 DIAGNOSIS — N183 Chronic kidney disease, stage 3 unspecified: Secondary | ICD-10-CM | POA: Diagnosis not present

## 2021-01-22 DIAGNOSIS — E063 Autoimmune thyroiditis: Secondary | ICD-10-CM | POA: Diagnosis not present

## 2021-01-22 DIAGNOSIS — U099 Post covid-19 condition, unspecified: Secondary | ICD-10-CM | POA: Diagnosis not present

## 2021-01-22 DIAGNOSIS — J449 Chronic obstructive pulmonary disease, unspecified: Secondary | ICD-10-CM | POA: Diagnosis not present

## 2021-01-22 DIAGNOSIS — R2681 Unsteadiness on feet: Secondary | ICD-10-CM | POA: Diagnosis not present

## 2021-01-22 DIAGNOSIS — M159 Polyosteoarthritis, unspecified: Secondary | ICD-10-CM | POA: Diagnosis not present

## 2021-01-22 DIAGNOSIS — M810 Age-related osteoporosis without current pathological fracture: Secondary | ICD-10-CM | POA: Diagnosis not present

## 2021-01-22 DIAGNOSIS — R5383 Other fatigue: Secondary | ICD-10-CM | POA: Diagnosis not present

## 2021-01-25 DIAGNOSIS — J449 Chronic obstructive pulmonary disease, unspecified: Secondary | ICD-10-CM | POA: Diagnosis not present

## 2021-01-25 DIAGNOSIS — M810 Age-related osteoporosis without current pathological fracture: Secondary | ICD-10-CM | POA: Diagnosis not present

## 2021-01-25 DIAGNOSIS — R5383 Other fatigue: Secondary | ICD-10-CM | POA: Diagnosis not present

## 2021-01-25 DIAGNOSIS — F17210 Nicotine dependence, cigarettes, uncomplicated: Secondary | ICD-10-CM | POA: Diagnosis not present

## 2021-01-25 DIAGNOSIS — U099 Post covid-19 condition, unspecified: Secondary | ICD-10-CM | POA: Diagnosis not present

## 2021-01-25 DIAGNOSIS — E063 Autoimmune thyroiditis: Secondary | ICD-10-CM | POA: Diagnosis not present

## 2021-01-25 DIAGNOSIS — R2681 Unsteadiness on feet: Secondary | ICD-10-CM | POA: Diagnosis not present

## 2021-01-25 DIAGNOSIS — N183 Chronic kidney disease, stage 3 unspecified: Secondary | ICD-10-CM | POA: Diagnosis not present

## 2021-01-25 DIAGNOSIS — M159 Polyosteoarthritis, unspecified: Secondary | ICD-10-CM | POA: Diagnosis not present

## 2021-01-28 DIAGNOSIS — U099 Post covid-19 condition, unspecified: Secondary | ICD-10-CM | POA: Diagnosis not present

## 2021-01-28 DIAGNOSIS — E063 Autoimmune thyroiditis: Secondary | ICD-10-CM | POA: Diagnosis not present

## 2021-01-28 DIAGNOSIS — J449 Chronic obstructive pulmonary disease, unspecified: Secondary | ICD-10-CM | POA: Diagnosis not present

## 2021-01-28 DIAGNOSIS — R5383 Other fatigue: Secondary | ICD-10-CM | POA: Diagnosis not present

## 2021-01-28 DIAGNOSIS — N183 Chronic kidney disease, stage 3 unspecified: Secondary | ICD-10-CM | POA: Diagnosis not present

## 2021-01-28 DIAGNOSIS — M159 Polyosteoarthritis, unspecified: Secondary | ICD-10-CM | POA: Diagnosis not present

## 2021-01-28 DIAGNOSIS — J9601 Acute respiratory failure with hypoxia: Secondary | ICD-10-CM | POA: Diagnosis not present

## 2021-01-28 DIAGNOSIS — U071 COVID-19: Secondary | ICD-10-CM | POA: Diagnosis not present

## 2021-01-28 DIAGNOSIS — F17210 Nicotine dependence, cigarettes, uncomplicated: Secondary | ICD-10-CM | POA: Diagnosis not present

## 2021-01-28 DIAGNOSIS — M810 Age-related osteoporosis without current pathological fracture: Secondary | ICD-10-CM | POA: Diagnosis not present

## 2021-01-28 DIAGNOSIS — R2681 Unsteadiness on feet: Secondary | ICD-10-CM | POA: Diagnosis not present

## 2021-01-29 DIAGNOSIS — J449 Chronic obstructive pulmonary disease, unspecified: Secondary | ICD-10-CM | POA: Diagnosis not present

## 2021-01-29 DIAGNOSIS — U071 COVID-19: Secondary | ICD-10-CM | POA: Diagnosis not present

## 2021-01-29 DIAGNOSIS — J9601 Acute respiratory failure with hypoxia: Secondary | ICD-10-CM | POA: Diagnosis not present

## 2021-01-31 DIAGNOSIS — R5383 Other fatigue: Secondary | ICD-10-CM | POA: Diagnosis not present

## 2021-01-31 DIAGNOSIS — M810 Age-related osteoporosis without current pathological fracture: Secondary | ICD-10-CM | POA: Diagnosis not present

## 2021-01-31 DIAGNOSIS — E063 Autoimmune thyroiditis: Secondary | ICD-10-CM | POA: Diagnosis not present

## 2021-01-31 DIAGNOSIS — J449 Chronic obstructive pulmonary disease, unspecified: Secondary | ICD-10-CM | POA: Diagnosis not present

## 2021-01-31 DIAGNOSIS — M159 Polyosteoarthritis, unspecified: Secondary | ICD-10-CM | POA: Diagnosis not present

## 2021-01-31 DIAGNOSIS — U099 Post covid-19 condition, unspecified: Secondary | ICD-10-CM | POA: Diagnosis not present

## 2021-01-31 DIAGNOSIS — F17210 Nicotine dependence, cigarettes, uncomplicated: Secondary | ICD-10-CM | POA: Diagnosis not present

## 2021-01-31 DIAGNOSIS — N183 Chronic kidney disease, stage 3 unspecified: Secondary | ICD-10-CM | POA: Diagnosis not present

## 2021-01-31 DIAGNOSIS — R2681 Unsteadiness on feet: Secondary | ICD-10-CM | POA: Diagnosis not present

## 2021-02-04 DIAGNOSIS — M810 Age-related osteoporosis without current pathological fracture: Secondary | ICD-10-CM | POA: Diagnosis not present

## 2021-02-04 DIAGNOSIS — R2681 Unsteadiness on feet: Secondary | ICD-10-CM | POA: Diagnosis not present

## 2021-02-04 DIAGNOSIS — E063 Autoimmune thyroiditis: Secondary | ICD-10-CM | POA: Diagnosis not present

## 2021-02-04 DIAGNOSIS — R5383 Other fatigue: Secondary | ICD-10-CM | POA: Diagnosis not present

## 2021-02-04 DIAGNOSIS — N183 Chronic kidney disease, stage 3 unspecified: Secondary | ICD-10-CM | POA: Diagnosis not present

## 2021-02-04 DIAGNOSIS — F17210 Nicotine dependence, cigarettes, uncomplicated: Secondary | ICD-10-CM | POA: Diagnosis not present

## 2021-02-04 DIAGNOSIS — U099 Post covid-19 condition, unspecified: Secondary | ICD-10-CM | POA: Diagnosis not present

## 2021-02-04 DIAGNOSIS — M159 Polyosteoarthritis, unspecified: Secondary | ICD-10-CM | POA: Diagnosis not present

## 2021-02-04 DIAGNOSIS — J449 Chronic obstructive pulmonary disease, unspecified: Secondary | ICD-10-CM | POA: Diagnosis not present

## 2021-02-10 DIAGNOSIS — M810 Age-related osteoporosis without current pathological fracture: Secondary | ICD-10-CM | POA: Diagnosis not present

## 2021-02-10 DIAGNOSIS — J449 Chronic obstructive pulmonary disease, unspecified: Secondary | ICD-10-CM | POA: Diagnosis not present

## 2021-02-10 DIAGNOSIS — U099 Post covid-19 condition, unspecified: Secondary | ICD-10-CM | POA: Diagnosis not present

## 2021-02-10 DIAGNOSIS — M159 Polyosteoarthritis, unspecified: Secondary | ICD-10-CM | POA: Diagnosis not present

## 2021-02-10 DIAGNOSIS — E063 Autoimmune thyroiditis: Secondary | ICD-10-CM | POA: Diagnosis not present

## 2021-02-10 DIAGNOSIS — R5383 Other fatigue: Secondary | ICD-10-CM | POA: Diagnosis not present

## 2021-02-10 DIAGNOSIS — F17210 Nicotine dependence, cigarettes, uncomplicated: Secondary | ICD-10-CM | POA: Diagnosis not present

## 2021-02-10 DIAGNOSIS — R2681 Unsteadiness on feet: Secondary | ICD-10-CM | POA: Diagnosis not present

## 2021-02-10 DIAGNOSIS — N183 Chronic kidney disease, stage 3 unspecified: Secondary | ICD-10-CM | POA: Diagnosis not present

## 2021-02-14 DIAGNOSIS — J449 Chronic obstructive pulmonary disease, unspecified: Secondary | ICD-10-CM | POA: Diagnosis not present

## 2021-02-14 DIAGNOSIS — E7849 Other hyperlipidemia: Secondary | ICD-10-CM | POA: Diagnosis not present

## 2021-02-18 DIAGNOSIS — M810 Age-related osteoporosis without current pathological fracture: Secondary | ICD-10-CM | POA: Diagnosis not present

## 2021-02-18 DIAGNOSIS — M159 Polyosteoarthritis, unspecified: Secondary | ICD-10-CM | POA: Diagnosis not present

## 2021-02-18 DIAGNOSIS — R2681 Unsteadiness on feet: Secondary | ICD-10-CM | POA: Diagnosis not present

## 2021-02-18 DIAGNOSIS — E063 Autoimmune thyroiditis: Secondary | ICD-10-CM | POA: Diagnosis not present

## 2021-02-18 DIAGNOSIS — R5383 Other fatigue: Secondary | ICD-10-CM | POA: Diagnosis not present

## 2021-02-18 DIAGNOSIS — U099 Post covid-19 condition, unspecified: Secondary | ICD-10-CM | POA: Diagnosis not present

## 2021-02-18 DIAGNOSIS — F17210 Nicotine dependence, cigarettes, uncomplicated: Secondary | ICD-10-CM | POA: Diagnosis not present

## 2021-02-18 DIAGNOSIS — J449 Chronic obstructive pulmonary disease, unspecified: Secondary | ICD-10-CM | POA: Diagnosis not present

## 2021-02-18 DIAGNOSIS — N183 Chronic kidney disease, stage 3 unspecified: Secondary | ICD-10-CM | POA: Diagnosis not present

## 2021-02-24 DIAGNOSIS — N183 Chronic kidney disease, stage 3 unspecified: Secondary | ICD-10-CM | POA: Diagnosis not present

## 2021-02-24 DIAGNOSIS — M810 Age-related osteoporosis without current pathological fracture: Secondary | ICD-10-CM | POA: Diagnosis not present

## 2021-02-24 DIAGNOSIS — M159 Polyosteoarthritis, unspecified: Secondary | ICD-10-CM | POA: Diagnosis not present

## 2021-02-24 DIAGNOSIS — F17210 Nicotine dependence, cigarettes, uncomplicated: Secondary | ICD-10-CM | POA: Diagnosis not present

## 2021-02-24 DIAGNOSIS — E063 Autoimmune thyroiditis: Secondary | ICD-10-CM | POA: Diagnosis not present

## 2021-02-24 DIAGNOSIS — U099 Post covid-19 condition, unspecified: Secondary | ICD-10-CM | POA: Diagnosis not present

## 2021-02-24 DIAGNOSIS — J449 Chronic obstructive pulmonary disease, unspecified: Secondary | ICD-10-CM | POA: Diagnosis not present

## 2021-02-24 DIAGNOSIS — R2681 Unsteadiness on feet: Secondary | ICD-10-CM | POA: Diagnosis not present

## 2021-02-24 DIAGNOSIS — R5383 Other fatigue: Secondary | ICD-10-CM | POA: Diagnosis not present

## 2021-02-28 DIAGNOSIS — J9601 Acute respiratory failure with hypoxia: Secondary | ICD-10-CM | POA: Diagnosis not present

## 2021-02-28 DIAGNOSIS — U071 COVID-19: Secondary | ICD-10-CM | POA: Diagnosis not present

## 2021-02-28 DIAGNOSIS — J449 Chronic obstructive pulmonary disease, unspecified: Secondary | ICD-10-CM | POA: Diagnosis not present

## 2021-03-01 DIAGNOSIS — U071 COVID-19: Secondary | ICD-10-CM | POA: Diagnosis not present

## 2021-03-01 DIAGNOSIS — J9601 Acute respiratory failure with hypoxia: Secondary | ICD-10-CM | POA: Diagnosis not present

## 2021-03-01 DIAGNOSIS — J449 Chronic obstructive pulmonary disease, unspecified: Secondary | ICD-10-CM | POA: Diagnosis not present

## 2021-03-02 DIAGNOSIS — R5383 Other fatigue: Secondary | ICD-10-CM | POA: Diagnosis not present

## 2021-03-02 DIAGNOSIS — J449 Chronic obstructive pulmonary disease, unspecified: Secondary | ICD-10-CM | POA: Diagnosis not present

## 2021-03-02 DIAGNOSIS — R2681 Unsteadiness on feet: Secondary | ICD-10-CM | POA: Diagnosis not present

## 2021-03-02 DIAGNOSIS — E063 Autoimmune thyroiditis: Secondary | ICD-10-CM | POA: Diagnosis not present

## 2021-03-02 DIAGNOSIS — M810 Age-related osteoporosis without current pathological fracture: Secondary | ICD-10-CM | POA: Diagnosis not present

## 2021-03-02 DIAGNOSIS — U099 Post covid-19 condition, unspecified: Secondary | ICD-10-CM | POA: Diagnosis not present

## 2021-03-02 DIAGNOSIS — M159 Polyosteoarthritis, unspecified: Secondary | ICD-10-CM | POA: Diagnosis not present

## 2021-03-02 DIAGNOSIS — N183 Chronic kidney disease, stage 3 unspecified: Secondary | ICD-10-CM | POA: Diagnosis not present

## 2021-03-02 DIAGNOSIS — F17210 Nicotine dependence, cigarettes, uncomplicated: Secondary | ICD-10-CM | POA: Diagnosis not present

## 2021-03-11 DIAGNOSIS — R2681 Unsteadiness on feet: Secondary | ICD-10-CM | POA: Diagnosis not present

## 2021-03-11 DIAGNOSIS — N183 Chronic kidney disease, stage 3 unspecified: Secondary | ICD-10-CM | POA: Diagnosis not present

## 2021-03-11 DIAGNOSIS — R5383 Other fatigue: Secondary | ICD-10-CM | POA: Diagnosis not present

## 2021-03-11 DIAGNOSIS — J449 Chronic obstructive pulmonary disease, unspecified: Secondary | ICD-10-CM | POA: Diagnosis not present

## 2021-03-11 DIAGNOSIS — M810 Age-related osteoporosis without current pathological fracture: Secondary | ICD-10-CM | POA: Diagnosis not present

## 2021-03-11 DIAGNOSIS — E063 Autoimmune thyroiditis: Secondary | ICD-10-CM | POA: Diagnosis not present

## 2021-03-11 DIAGNOSIS — F17210 Nicotine dependence, cigarettes, uncomplicated: Secondary | ICD-10-CM | POA: Diagnosis not present

## 2021-03-11 DIAGNOSIS — U099 Post covid-19 condition, unspecified: Secondary | ICD-10-CM | POA: Diagnosis not present

## 2021-03-11 DIAGNOSIS — M159 Polyosteoarthritis, unspecified: Secondary | ICD-10-CM | POA: Diagnosis not present

## 2021-03-12 DIAGNOSIS — G894 Chronic pain syndrome: Secondary | ICD-10-CM | POA: Diagnosis not present

## 2021-03-12 DIAGNOSIS — J449 Chronic obstructive pulmonary disease, unspecified: Secondary | ICD-10-CM | POA: Diagnosis not present

## 2021-03-12 DIAGNOSIS — Z681 Body mass index (BMI) 19 or less, adult: Secondary | ICD-10-CM | POA: Diagnosis not present

## 2021-03-12 DIAGNOSIS — E063 Autoimmune thyroiditis: Secondary | ICD-10-CM | POA: Diagnosis not present

## 2021-03-12 DIAGNOSIS — M1991 Primary osteoarthritis, unspecified site: Secondary | ICD-10-CM | POA: Diagnosis not present

## 2021-03-16 DIAGNOSIS — R5383 Other fatigue: Secondary | ICD-10-CM | POA: Diagnosis not present

## 2021-03-16 DIAGNOSIS — U099 Post covid-19 condition, unspecified: Secondary | ICD-10-CM | POA: Diagnosis not present

## 2021-03-16 DIAGNOSIS — J449 Chronic obstructive pulmonary disease, unspecified: Secondary | ICD-10-CM | POA: Diagnosis not present

## 2021-03-16 DIAGNOSIS — R2681 Unsteadiness on feet: Secondary | ICD-10-CM | POA: Diagnosis not present

## 2021-03-16 DIAGNOSIS — M810 Age-related osteoporosis without current pathological fracture: Secondary | ICD-10-CM | POA: Diagnosis not present

## 2021-03-16 DIAGNOSIS — M159 Polyosteoarthritis, unspecified: Secondary | ICD-10-CM | POA: Diagnosis not present

## 2021-03-16 DIAGNOSIS — E063 Autoimmune thyroiditis: Secondary | ICD-10-CM | POA: Diagnosis not present

## 2021-03-16 DIAGNOSIS — F17210 Nicotine dependence, cigarettes, uncomplicated: Secondary | ICD-10-CM | POA: Diagnosis not present

## 2021-03-16 DIAGNOSIS — N183 Chronic kidney disease, stage 3 unspecified: Secondary | ICD-10-CM | POA: Diagnosis not present

## 2021-03-17 DIAGNOSIS — E7849 Other hyperlipidemia: Secondary | ICD-10-CM | POA: Diagnosis not present

## 2021-03-17 DIAGNOSIS — J449 Chronic obstructive pulmonary disease, unspecified: Secondary | ICD-10-CM | POA: Diagnosis not present

## 2021-03-18 DIAGNOSIS — E063 Autoimmune thyroiditis: Secondary | ICD-10-CM | POA: Diagnosis not present

## 2021-03-18 DIAGNOSIS — U099 Post covid-19 condition, unspecified: Secondary | ICD-10-CM | POA: Diagnosis not present

## 2021-03-18 DIAGNOSIS — F17210 Nicotine dependence, cigarettes, uncomplicated: Secondary | ICD-10-CM | POA: Diagnosis not present

## 2021-03-18 DIAGNOSIS — N3000 Acute cystitis without hematuria: Secondary | ICD-10-CM | POA: Diagnosis not present

## 2021-03-18 DIAGNOSIS — R2681 Unsteadiness on feet: Secondary | ICD-10-CM | POA: Diagnosis not present

## 2021-03-18 DIAGNOSIS — N183 Chronic kidney disease, stage 3 unspecified: Secondary | ICD-10-CM | POA: Diagnosis not present

## 2021-03-18 DIAGNOSIS — M159 Polyosteoarthritis, unspecified: Secondary | ICD-10-CM | POA: Diagnosis not present

## 2021-03-18 DIAGNOSIS — R5383 Other fatigue: Secondary | ICD-10-CM | POA: Diagnosis not present

## 2021-03-18 DIAGNOSIS — J449 Chronic obstructive pulmonary disease, unspecified: Secondary | ICD-10-CM | POA: Diagnosis not present

## 2021-03-18 DIAGNOSIS — M810 Age-related osteoporosis without current pathological fracture: Secondary | ICD-10-CM | POA: Diagnosis not present

## 2021-03-22 ENCOUNTER — Inpatient Hospital Stay (HOSPITAL_COMMUNITY): Payer: Medicare Other

## 2021-03-22 ENCOUNTER — Inpatient Hospital Stay (HOSPITAL_COMMUNITY)
Admission: EM | Admit: 2021-03-22 | Discharge: 2021-03-25 | DRG: 637 | Disposition: A | Discharge status: Home Health | Payer: Medicare Other | Attending: Internal Medicine | Admitting: Internal Medicine

## 2021-03-22 ENCOUNTER — Encounter (HOSPITAL_COMMUNITY): Payer: Self-pay | Admitting: *Deleted

## 2021-03-22 ENCOUNTER — Other Ambulatory Visit: Payer: Self-pay

## 2021-03-22 DIAGNOSIS — G894 Chronic pain syndrome: Secondary | ICD-10-CM | POA: Diagnosis not present

## 2021-03-22 DIAGNOSIS — Z8616 Personal history of COVID-19: Secondary | ICD-10-CM | POA: Diagnosis not present

## 2021-03-22 DIAGNOSIS — E878 Other disorders of electrolyte and fluid balance, not elsewhere classified: Secondary | ICD-10-CM | POA: Diagnosis not present

## 2021-03-22 DIAGNOSIS — Z6834 Body mass index (BMI) 34.0-34.9, adult: Secondary | ICD-10-CM

## 2021-03-22 DIAGNOSIS — Z720 Tobacco use: Secondary | ICD-10-CM | POA: Diagnosis present

## 2021-03-22 DIAGNOSIS — E039 Hypothyroidism, unspecified: Secondary | ICD-10-CM | POA: Diagnosis not present

## 2021-03-22 DIAGNOSIS — R2689 Other abnormalities of gait and mobility: Secondary | ICD-10-CM | POA: Diagnosis present

## 2021-03-22 DIAGNOSIS — E1100 Type 2 diabetes mellitus with hyperosmolarity without nonketotic hyperglycemic-hyperosmolar coma (NKHHC): Principal | ICD-10-CM | POA: Diagnosis present

## 2021-03-22 DIAGNOSIS — Z96653 Presence of artificial knee joint, bilateral: Secondary | ICD-10-CM | POA: Diagnosis not present

## 2021-03-22 DIAGNOSIS — Z743 Need for continuous supervision: Secondary | ICD-10-CM | POA: Diagnosis not present

## 2021-03-22 DIAGNOSIS — Z66 Do not resuscitate: Secondary | ICD-10-CM | POA: Diagnosis present

## 2021-03-22 DIAGNOSIS — Z8249 Family history of ischemic heart disease and other diseases of the circulatory system: Secondary | ICD-10-CM

## 2021-03-22 DIAGNOSIS — G4700 Insomnia, unspecified: Secondary | ICD-10-CM | POA: Diagnosis not present

## 2021-03-22 DIAGNOSIS — E860 Dehydration: Secondary | ICD-10-CM | POA: Diagnosis not present

## 2021-03-22 DIAGNOSIS — Z79890 Hormone replacement therapy (postmenopausal): Secondary | ICD-10-CM

## 2021-03-22 DIAGNOSIS — R296 Repeated falls: Secondary | ICD-10-CM | POA: Diagnosis present

## 2021-03-22 DIAGNOSIS — N390 Urinary tract infection, site not specified: Secondary | ICD-10-CM | POA: Diagnosis not present

## 2021-03-22 DIAGNOSIS — M21371 Foot drop, right foot: Secondary | ICD-10-CM | POA: Diagnosis not present

## 2021-03-22 DIAGNOSIS — E1165 Type 2 diabetes mellitus with hyperglycemia: Secondary | ICD-10-CM | POA: Diagnosis not present

## 2021-03-22 DIAGNOSIS — F419 Anxiety disorder, unspecified: Secondary | ICD-10-CM | POA: Diagnosis present

## 2021-03-22 DIAGNOSIS — Z79899 Other long term (current) drug therapy: Secondary | ICD-10-CM

## 2021-03-22 DIAGNOSIS — I7389 Other specified peripheral vascular diseases: Secondary | ICD-10-CM | POA: Diagnosis not present

## 2021-03-22 DIAGNOSIS — B952 Enterococcus as the cause of diseases classified elsewhere: Secondary | ICD-10-CM | POA: Diagnosis present

## 2021-03-22 DIAGNOSIS — M810 Age-related osteoporosis without current pathological fracture: Secondary | ICD-10-CM | POA: Diagnosis present

## 2021-03-22 DIAGNOSIS — J449 Chronic obstructive pulmonary disease, unspecified: Secondary | ICD-10-CM | POA: Diagnosis present

## 2021-03-22 DIAGNOSIS — E872 Acidosis: Secondary | ICD-10-CM | POA: Diagnosis present

## 2021-03-22 DIAGNOSIS — Z825 Family history of asthma and other chronic lower respiratory diseases: Secondary | ICD-10-CM

## 2021-03-22 DIAGNOSIS — Z79891 Long term (current) use of opiate analgesic: Secondary | ICD-10-CM

## 2021-03-22 DIAGNOSIS — E875 Hyperkalemia: Secondary | ICD-10-CM | POA: Diagnosis present

## 2021-03-22 DIAGNOSIS — R110 Nausea: Secondary | ICD-10-CM | POA: Diagnosis not present

## 2021-03-22 DIAGNOSIS — E038 Other specified hypothyroidism: Secondary | ICD-10-CM | POA: Diagnosis not present

## 2021-03-22 DIAGNOSIS — G9341 Metabolic encephalopathy: Secondary | ICD-10-CM | POA: Diagnosis not present

## 2021-03-22 DIAGNOSIS — E669 Obesity, unspecified: Secondary | ICD-10-CM | POA: Diagnosis present

## 2021-03-22 DIAGNOSIS — I10 Essential (primary) hypertension: Secondary | ICD-10-CM | POA: Diagnosis present

## 2021-03-22 DIAGNOSIS — Z8673 Personal history of transient ischemic attack (TIA), and cerebral infarction without residual deficits: Secondary | ICD-10-CM

## 2021-03-22 DIAGNOSIS — F17210 Nicotine dependence, cigarettes, uncomplicated: Secondary | ICD-10-CM | POA: Diagnosis present

## 2021-03-22 DIAGNOSIS — R42 Dizziness and giddiness: Secondary | ICD-10-CM

## 2021-03-22 DIAGNOSIS — E876 Hypokalemia: Secondary | ICD-10-CM | POA: Diagnosis present

## 2021-03-22 DIAGNOSIS — Z9981 Dependence on supplemental oxygen: Secondary | ICD-10-CM | POA: Diagnosis not present

## 2021-03-22 DIAGNOSIS — R739 Hyperglycemia, unspecified: Secondary | ICD-10-CM | POA: Diagnosis present

## 2021-03-22 DIAGNOSIS — N179 Acute kidney failure, unspecified: Secondary | ICD-10-CM | POA: Diagnosis not present

## 2021-03-22 DIAGNOSIS — R531 Weakness: Secondary | ICD-10-CM

## 2021-03-22 DIAGNOSIS — R55 Syncope and collapse: Secondary | ICD-10-CM

## 2021-03-22 DIAGNOSIS — R0689 Other abnormalities of breathing: Secondary | ICD-10-CM | POA: Diagnosis not present

## 2021-03-22 LAB — URINALYSIS, MICROSCOPIC (REFLEX)

## 2021-03-22 LAB — MRSA NEXT GEN BY PCR, NASAL: MRSA by PCR Next Gen: NOT DETECTED

## 2021-03-22 LAB — COMPREHENSIVE METABOLIC PANEL
ALT: 62 U/L — ABNORMAL HIGH (ref 0–44)
AST: 44 U/L — ABNORMAL HIGH (ref 15–41)
Albumin: 4.4 g/dL (ref 3.5–5.0)
Alkaline Phosphatase: 97 U/L (ref 38–126)
Anion gap: 18 — ABNORMAL HIGH (ref 5–15)
BUN: 31 mg/dL — ABNORMAL HIGH (ref 8–23)
CO2: 23 mmol/L (ref 22–32)
Calcium: 9.9 mg/dL (ref 8.9–10.3)
Chloride: 92 mmol/L — ABNORMAL LOW (ref 98–111)
Creatinine, Ser: 2.28 mg/dL — ABNORMAL HIGH (ref 0.44–1.00)
GFR, Estimated: 22 mL/min — ABNORMAL LOW (ref >60)
Glucose, Bld: 1201 mg/dL (ref 70–99)
Potassium: 5.4 mmol/L — ABNORMAL HIGH (ref 3.5–5.1)
Sodium: 133 mmol/L — ABNORMAL LOW (ref 135–145)
Total Bilirubin: 0.8 mg/dL (ref 0.3–1.2)
Total Protein: 8.1 g/dL (ref 6.5–8.1)

## 2021-03-22 LAB — BASIC METABOLIC PANEL
Anion gap: 14 (ref 5–15)
Anion gap: 12 (ref 5–15)
Anion gap: 9 (ref 5–15)
Anion gap: 7 (ref 5–15)
BUN: 30 mg/dL — ABNORMAL HIGH (ref 8–23)
BUN: 26 mg/dL — ABNORMAL HIGH (ref 8–23)
BUN: 25 mg/dL — ABNORMAL HIGH (ref 8–23)
BUN: 24 mg/dL — ABNORMAL HIGH (ref 8–23)
CO2: 25 mmol/L (ref 22–32)
CO2: 28 mmol/L (ref 22–32)
CO2: 29 mmol/L (ref 22–32)
CO2: 31 mmol/L (ref 22–32)
Calcium: 9.8 mg/dL (ref 8.9–10.3)
Calcium: 10.1 mg/dL (ref 8.9–10.3)
Calcium: 10 mg/dL (ref 8.9–10.3)
Calcium: 9.7 mg/dL (ref 8.9–10.3)
Chloride: 100 mmol/L (ref 98–111)
Chloride: 107 mmol/L (ref 98–111)
Chloride: 111 mmol/L (ref 98–111)
Chloride: 112 mmol/L — ABNORMAL HIGH (ref 98–111)
Creatinine, Ser: 2 mg/dL — ABNORMAL HIGH (ref 0.44–1.00)
Creatinine, Ser: 1.79 mg/dL — ABNORMAL HIGH (ref 0.44–1.00)
Creatinine, Ser: 1.7 mg/dL — ABNORMAL HIGH (ref 0.44–1.00)
Creatinine, Ser: 1.53 mg/dL — ABNORMAL HIGH (ref 0.44–1.00)
GFR, Estimated: 25 mL/min — ABNORMAL LOW (ref >60)
GFR, Estimated: 29 mL/min — ABNORMAL LOW (ref >60)
GFR, Estimated: 31 mL/min — ABNORMAL LOW (ref >60)
GFR, Estimated: 35 mL/min — ABNORMAL LOW (ref >60)
Glucose, Bld: 1042 mg/dL (ref 70–99)
Glucose, Bld: 703 mg/dL (ref 70–99)
Glucose, Bld: 408 mg/dL — ABNORMAL HIGH (ref 70–99)
Glucose, Bld: 210 mg/dL — ABNORMAL HIGH (ref 70–99)
Potassium: 3.9 mmol/L (ref 3.5–5.1)
Potassium: 3.5 mmol/L (ref 3.5–5.1)
Potassium: 3.1 mmol/L — ABNORMAL LOW (ref 3.5–5.1)
Potassium: 3.7 mmol/L (ref 3.5–5.1)
Sodium: 139 mmol/L (ref 135–145)
Sodium: 147 mmol/L — ABNORMAL HIGH (ref 135–145)
Sodium: 149 mmol/L — ABNORMAL HIGH (ref 135–145)
Sodium: 150 mmol/L — ABNORMAL HIGH (ref 135–145)

## 2021-03-22 LAB — GLUCOSE, CAPILLARY
Glucose-Capillary: >600 mg/dL (ref 70–99)
Glucose-Capillary: >600 mg/dL (ref 70–99)
Glucose-Capillary: 556 mg/dL (ref 70–99)
Glucose-Capillary: 598 mg/dL (ref 70–99)
Glucose-Capillary: 509 mg/dL (ref 70–99)
Glucose-Capillary: 469 mg/dL — ABNORMAL HIGH (ref 70–99)
Glucose-Capillary: 472 mg/dL — ABNORMAL HIGH (ref 70–99)
Glucose-Capillary: 406 mg/dL — ABNORMAL HIGH (ref 70–99)
Glucose-Capillary: 352 mg/dL — ABNORMAL HIGH (ref 70–99)
Glucose-Capillary: 262 mg/dL — ABNORMAL HIGH (ref 70–99)
Glucose-Capillary: 185 mg/dL — ABNORMAL HIGH (ref 70–99)
Glucose-Capillary: 167 mg/dL — ABNORMAL HIGH (ref 70–99)
Glucose-Capillary: 182 mg/dL — ABNORMAL HIGH (ref 70–99)

## 2021-03-22 LAB — CBC WITH DIFFERENTIAL/PLATELET
Abs Immature Granulocytes: 0.05 10*3/uL (ref 0.00–0.07)
Basophils Absolute: 0 10*3/uL (ref 0.0–0.1)
Basophils Relative: 1 %
Eosinophils Absolute: 0 10*3/uL (ref 0.0–0.5)
Eosinophils Relative: 0 %
HCT: 49.8 % — ABNORMAL HIGH (ref 36.0–46.0)
Hemoglobin: 15.3 g/dL — ABNORMAL HIGH (ref 12.0–15.0)
Immature Granulocytes: 1 %
Lymphocytes Relative: 7 %
Lymphs Abs: 0.6 10*3/uL — ABNORMAL LOW (ref 0.7–4.0)
MCH: 30.7 pg (ref 26.0–34.0)
MCHC: 30.7 g/dL (ref 30.0–36.0)
MCV: 99.8 fL (ref 80.0–100.0)
Monocytes Absolute: 0.4 10*3/uL (ref 0.1–1.0)
Monocytes Relative: 4 %
Neutro Abs: 7.6 10*3/uL (ref 1.7–7.7)
Neutrophils Relative %: 87 %
Platelets: 228 10*3/uL (ref 150–400)
RBC: 4.99 MIL/uL (ref 3.87–5.11)
RDW: 14.5 % (ref 11.5–15.5)
WBC: 8.7 10*3/uL (ref 4.0–10.5)
nRBC: 0 % (ref 0.0–0.2)

## 2021-03-22 LAB — URINALYSIS, ROUTINE W REFLEX MICROSCOPIC
Bilirubin Urine: NEGATIVE
Glucose, UA: >=500 mg/dL — AB
Ketones, ur: NEGATIVE mg/dL
Leukocytes,Ua: NEGATIVE
Nitrite: NEGATIVE
Protein, ur: NEGATIVE mg/dL
Specific Gravity, Urine: <1.005 — ABNORMAL LOW (ref 1.005–1.030)
pH: 5.5 (ref 5.0–8.0)

## 2021-03-22 LAB — BETA-HYDROXYBUTYRIC ACID
Beta-Hydroxybutyric Acid: 0.59 mmol/L — ABNORMAL HIGH (ref 0.05–0.27)
Beta-Hydroxybutyric Acid: 0.19 mmol/L (ref 0.05–0.27)

## 2021-03-22 LAB — RESP PANEL BY RT-PCR (FLU A&B, COVID) ARPGX2
Influenza A by PCR: NEGATIVE
Influenza B by PCR: NEGATIVE
SARS Coronavirus 2 by RT PCR: NEGATIVE

## 2021-03-22 LAB — CBG MONITORING, ED
Glucose-Capillary: >600 mg/dL (ref 70–99)
Glucose-Capillary: >600 mg/dL (ref 70–99)
Glucose-Capillary: >600 mg/dL (ref 70–99)
Glucose-Capillary: >600 mg/dL (ref 70–99)
Glucose-Capillary: >600 mg/dL (ref 70–99)
Glucose-Capillary: >600 mg/dL (ref 70–99)

## 2021-03-22 LAB — BLOOD GAS, VENOUS
Acid-Base Excess: 2 mmol/L (ref 0.0–2.0)
Bicarbonate: 23.9 mmol/L (ref 20.0–28.0)
FIO2: 28.5
O2 Saturation: 63.4 %
Patient temperature: 36.4
pCO2, Ven: 56.9 mmHg (ref 44.0–60.0)
pH, Ven: 7.307 (ref 7.250–7.430)
pO2, Ven: 37.8 mmHg (ref 32.0–45.0)

## 2021-03-22 LAB — LACTIC ACID, PLASMA
Lactic Acid, Venous: 8.2 mmol/L (ref 0.5–1.9)
Lactic Acid, Venous: 5.2 mmol/L (ref 0.5–1.9)

## 2021-03-22 LAB — LIPID PANEL
Cholesterol: 149 mg/dL (ref 0–200)
HDL: 31 mg/dL — ABNORMAL LOW (ref >40)
LDL Cholesterol: 86 mg/dL (ref 0–99)
Total CHOL/HDL Ratio: 4.8 RATIO
Triglycerides: 161 mg/dL — ABNORMAL HIGH (ref <150)
VLDL: 32 mg/dL (ref 0–40)

## 2021-03-22 LAB — LIPASE, BLOOD: Lipase: 36 U/L (ref 11–51)

## 2021-03-22 LAB — OSMOLALITY: Osmolality: 375 mOsm/kg (ref 275–295)

## 2021-03-22 MED ORDER — LEVOTHYROXINE SODIUM 75 MCG PO TABS
75.0000 ug | ORAL_TABLET | Freq: Every day | ORAL | Status: DC
  Administered 2021-03-23 – 2021-03-25 (×3): 75 ug via ORAL
  Filled 2021-03-22 (×3): qty 1

## 2021-03-22 MED ORDER — POTASSIUM CHLORIDE 10 MEQ/100ML IV SOLN: 10.0000 meq | INTRAVENOUS | Status: DC

## 2021-03-22 MED ORDER — CHLORHEXIDINE GLUCONATE CLOTH 2 % EX PADS
6.0000 | MEDICATED_PAD | Freq: Every day | CUTANEOUS | Status: DC
  Administered 2021-03-22 – 2021-03-25 (×4): 6 via TOPICAL

## 2021-03-22 MED ORDER — NICOTINE 7 MG/24HR TD PT24
7.0000 mg | MEDICATED_PATCH | Freq: Every day | TRANSDERMAL | Status: DC
  Administered 2021-03-24: 7 mg via TRANSDERMAL
  Filled 2021-03-22 (×3): qty 1

## 2021-03-22 MED ORDER — LACTATED RINGERS IV BOLUS (SEPSIS)
700.0000 mL | Freq: Once | INTRAVENOUS | Status: AC
  Administered 2021-03-22: 700 mL via INTRAVENOUS

## 2021-03-22 MED ORDER — HEPARIN SODIUM (PORCINE) 5000 UNIT/ML IJ SOLN
5000.0000 [IU] | Freq: Three times a day (TID) | INTRAMUSCULAR | Status: DC
  Administered 2021-03-22 – 2021-03-25 (×9): 5000 [IU] via SUBCUTANEOUS
  Filled 2021-03-22 (×9): qty 1

## 2021-03-22 MED ORDER — INSULIN REGULAR(HUMAN) IN NACL 100-0.9 UT/100ML-% IV SOLN: INTRAVENOUS | Status: DC

## 2021-03-22 MED ORDER — LACTATED RINGERS IV BOLUS
20.0000 mL/kg | Freq: Once | INTRAVENOUS | Status: AC
  Administered 2021-03-22: 1116 mL via INTRAVENOUS

## 2021-03-22 MED ORDER — POTASSIUM CHLORIDE 10 MEQ/100ML IV SOLN
10.0000 meq | INTRAVENOUS | Status: AC
Start: 2021-03-22 — End: 2021-03-23
  Administered 2021-03-22 (×3): 10 meq via INTRAVENOUS
  Filled 2021-03-22 (×3): qty 100

## 2021-03-22 MED ORDER — DEXTROSE IN LACTATED RINGERS 5 % IV SOLN: INTRAVENOUS | Status: DC

## 2021-03-22 MED ORDER — LACTATED RINGERS IV BOLUS
1000.0000 mL | Freq: Once | INTRAVENOUS | Status: AC
  Administered 2021-03-22: 1000 mL via INTRAVENOUS

## 2021-03-22 MED ORDER — DEXTROSE 50 % IV SOLN: 0.0000 mL | INTRAVENOUS | Status: DC | PRN

## 2021-03-22 MED ORDER — ENOXAPARIN SODIUM 40 MG/0.4ML IJ SOSY: 40.0000 mg | PREFILLED_SYRINGE | INTRAMUSCULAR | Status: DC

## 2021-03-22 MED ORDER — LACTATED RINGERS IV SOLN: INTRAVENOUS | Status: DC

## 2021-03-22 MED ORDER — SODIUM CHLORIDE 0.9 % IV SOLN
1.0000 g | Freq: Once | INTRAVENOUS | Status: AC
  Administered 2021-03-22: 1 g via INTRAVENOUS
  Filled 2021-03-22: qty 10

## 2021-03-22 MED ORDER — LACTATED RINGERS IV SOLN: INTRAVENOUS | Status: AC

## 2021-03-22 MED ORDER — INSULIN REGULAR(HUMAN) IN NACL 100-0.9 UT/100ML-% IV SOLN
INTRAVENOUS | Status: DC
  Administered 2021-03-22: 7 [IU]/h via INTRAVENOUS
  Filled 2021-03-22 (×3): qty 100

## 2021-03-22 NOTE — ED Triage Notes (Signed)
Pt brought in by [facility] from home. Pt is on her second round of Cipro for UTI. Family reports decreased oral intake x months. Pt c/o nausea. CBG read "high" for EMS. No hx of diabetes. HR 100, BP 134/85, O2 sat 92% on 2L O2 via [place], RR 30 for EMS.

## 2021-03-22 NOTE — ED Provider Notes (Signed)
[facility] EMERGENCY DEPARTMENT Provider Note   CSN: [id number] Arrival date & time: 03/22/21  2774     History No chief complaint on file.   Amanda Armstrong is a 75 y.o. female with PMH hypothyroidism, COPD on 1 L home O2, HTN, anxiety who presents the emergency department for evaluation of dysuria, hyperglycemia.  Per EMS, patient is on her "second round" of ciprofloxacin for a urinary tract infection and the patient has persistent dysuria.  Family indicating patient has had decreased oral intake for multiple months.  EMS states home glucose monitoring too high to register.  Denies chest pain, shortness of breath, abdominal pain, nausea, vomiting, headache, fever or other systemic symptoms.  Does endorse dysuria and increased frequency.  HPI     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    pt stated that she has intermittent sx   Clostridium difficile infection    Daytime somnolence    Frequent falls    Headache(784.0)    SINUS HEADACHE OCCASSIONALLY   Hypertension    Hypothyroidism    Multifactorial gait disorder    Osteoporosis    Right foot drop    Snoring    Stroke (HCC)    Tobacco use    Vertigo    Weakness     Patient Active Problem List   Diagnosis Date Noted   Unspecified protein-calorie malnutrition (HCC) 08/07/2020   Altered mental status    Tobacco abuse 08/04/2020   Acute metabolic encephalopathy 08/04/2020   Acute renal failure superimposed on stage 3b chronic kidney disease (HCC) 08/04/2020   COVID-19 08/03/2020   UTI (urinary tract infection) 01/29/2016   Nicotine dependence 09/07/2015   Enteritis due to Clostridium difficile 08/29/2013   Weakness 08/02/2013   Diarrhea 08/02/2013   Acute pyelonephritis 05/28/2013   UTI (lower urinary tract infection) 05/27/2013   Tachypnea 05/27/2013   Hyperglycemia 05/27/2013    Arthritis    Anxiety    Hypothyroidism     Past Surgical History:  Procedure Laterality Date   CATARACT EXTRACTION W/PHACO Right 06/28/2016   Procedure: CATARACT EXTRACTION PHACO AND INTRAOCULAR LENS PLACEMENT (IOC);  Surgeon: Jethro Bolus, MD;  Location: [facility];  Service: Ophthalmology;  Laterality: Right;  CDE: 11.10   CATARACT EXTRACTION W/PHACO Left 07/26/2016   Procedure: CATARACT EXTRACTION PHACO AND INTRAOCULAR LENS PLACEMENT (IOC);  Surgeon: Jethro Bolus, MD;  Location: [facility];  Service: Ophthalmology;  Laterality: Left;  CDE: 8.88   JOINT REPLACEMENT  11/08/3010   right total knee   TOTAL KNEE ARTHROPLASTY  11/07/2011   Procedure: TOTAL KNEE ARTHROPLASTY;  Surgeon: Nilda Simmer, MD;  Location: [facility] OR;  Service: Orthopedics;  Laterality: Left;  DR Thurston Hole WANTS 90 MINUTES FOR THIS CASE   TOTAL KNEE ARTHROPLASTY Right 2011   TUBAL LIGATION  1982     OB History     Gravida  4   Para  4   Term  4   Preterm      AB      Living         SAB      IAB      Ectopic      Multiple      Live Births              Family History  Problem Relation Age  of Onset   Heart attack Mother    COPD Father    Arthritis Sister    Heart disease Brother     Social History   Tobacco Use   Smoking status: Every Day    Packs/day: 1.00    Years: 48.00    Pack years: 48.00    Types: Cigarettes    Last attempt to quit: 06/15/2012    Years since quitting: 8.7   Smokeless tobacco: Never   Tobacco comments:    12/29/14 1/2 PPD  Vaping Use   Vaping Use: Never used  Substance Use Topics   Alcohol use: No    Alcohol/week: 0.0 standard drinks   Drug use: No    Home Medications Prior to Admission medications   Medication Sig Start Date End Date Taking? Authorizing Provider  acetaminophen (TYLENOL) 325 MG tablet Take 2 tablets (650 mg total) by mouth every 6 (six) hours as needed for up to 30 doses for mild pain or moderate pain. 08/31/20   Medina-Vargas, Monina C, NP   famotidine (PEPCID) 20 MG tablet Take 1 tablet (20 mg total) by mouth 2 (two) times daily. 08/31/20   Medina-Vargas, Monina C, NP  guaiFENesin-dextromethorphan (ROBITUSSIN DM) 100-10 MG/5ML syrup Take 10 mLs by mouth every 4 (four) hours as needed for cough. 08/31/20   Medina-Vargas, Monina C, NP  levalbuterol (XOPENEX HFA) 45 MCG/ACT inhaler Inhale 2 puffs into the lungs 2 (two) times daily. 08/31/20   Medina-Vargas, Monina C, NP  levothyroxine (SYNTHROID) 75 MCG tablet Take 1 tablet (75 mcg total) by mouth daily before breakfast. 08/31/20   Medina-Vargas, Monina C, NP  meclizine (ANTIVERT) 25 MG tablet Take 1 tablet (25 mg total) by mouth 3 (three) times daily as needed for dizziness. Every 8 hours 08/31/20   Medina-Vargas, Monina C, NP  nicotine (NICODERM CQ - DOSED IN MG/24 HR) 7 mg/24hr patch Place 1 patch (7 mg total) onto the skin daily. 08/31/20   Medina-Vargas, Monina C, NP  OXYGEN Inhale 2 L/min into the lungs.    [provider]  polyethylene glycol (MIRALAX / GLYCOLAX) 17 g packet Take 17 g by mouth daily as needed. 08/31/20   Medina-Vargas, Monina C, NP  terbinafine (LAMISIL) 250 MG tablet Take 1 tablet (250 mg total) by mouth every evening. 08/31/20   Medina-Vargas, Monina C, NP  traZODone (DESYREL) 50 MG tablet Take 1 tablet (50 mg total) by mouth at bedtime. 08/31/20   Medina-Vargas, Monina C, NP  umeclidinium-vilanterol (ANORO ELLIPTA) 62.5-25 MCG/INH AEPB Inhale 1 puff into the lungs daily. 08/31/20   Medina-Vargas, Monina C, NP  vitamin C (ASCORBIC ACID) 500 MG tablet Take 1 tablet (500 mg total) by mouth daily. 08/31/20   Medina-Vargas, Monina C, NP  zinc sulfate 220 (50 Zn) MG capsule Take 1 capsule (220 mg total) by mouth daily. 08/31/20   Medina-Vargas, Monina C, NP    Allergies    Iron, Oxycodone, and Sudafed [pseudoephedrine hcl]  Review of Systems   Review of Systems  Constitutional:  Negative for chills and fever.  HENT:  Negative for ear pain and sore throat.   Eyes:   Negative for pain and visual disturbance.  Respiratory:  Negative for cough and shortness of breath.   Cardiovascular:  Negative for chest pain and palpitations.  Gastrointestinal:  Negative for abdominal pain and vomiting.  Genitourinary:  Positive for dysuria, frequency and urgency. Negative for hematuria.  Musculoskeletal:  Negative for arthralgias and back pain.  Skin:  Negative for color  change and rash.  Neurological:  Negative for seizures and syncope.  All other systems reviewed and are negative.  Physical Exam Updated Vital Signs BP (!) 138/53 (BP Location: Left Arm)   Pulse 97   Temp (!) 97.5 F (36.4 C) (Oral)   Resp (!) 36   Ht 4\' 10"  (1.473 m)   Wt 55.8 kg   BMI 25.71 kg/m   Physical Exam Vitals and nursing note reviewed.  Constitutional:      General: She is not in acute distress.    Appearance: She is well-developed.  HENT:     Head: Normocephalic and atraumatic.  Eyes:     Conjunctiva/sclera: Conjunctivae normal.  Cardiovascular:     Rate and Rhythm: Normal rate and regular rhythm.     Heart sounds: No murmur heard. Pulmonary:     Effort: Pulmonary effort is normal. No respiratory distress.     Breath sounds: Normal breath sounds.  Abdominal:     Palpations: Abdomen is soft.     Tenderness: There is abdominal tenderness (suprapubic).  Musculoskeletal:     Cervical back: Neck supple.  Skin:    General: Skin is warm and dry.  Neurological:     Mental Status: She is alert.    ED Results / Procedures / Treatments   Labs (all labs ordered are listed, but only abnormal results are displayed) Labs Reviewed - No data to display  EKG None  Radiology No results found.  Procedures Procedures   Medications Ordered in ED Medications - No data to display  ED Course  I have reviewed the triage vital signs and the nursing notes.  Pertinent labs & imaging results that were available during my care of the patient were reviewed by me and considered in  my medical decision making (see chart for details).    MDM Rules/Calculators/A&P                           Patient seen the emergency department for evaluation of dysuria, increased frequency and hyperglycemia.  Physical exam reveals mild suprapubic tenderness to palpation but is otherwise unremarkable.  Laboratory evaluation reveals pseudohyponatremia 133, hyperkalemia 5.4, hypochloremia 92, significant glucose elevation to 1201, creatinine elevation to 2.28 with a BUN elevation to 31 which is significant for this patient, AST 44, ALT 62.  Initial lactate 8.2.  Patient started on ceftriaxone and a sepsis alert was called.  Patient given 30 cc/kg bolus and lactated Ringer's running at 150.  Insulin drip also started.  Patient will require admission for failure of outpatient antibiotics in setting of a likely urinary tract infection and significant presumed stress hyperglycemia versus HHS.  Patient's bicarb is normal at 23 so I have low suspicion for DKA at this time, but the patient does have an elevated anion gap to 23 so we will continue with aggressive fluid rehydration and insulin administration.  Patient then admitted. Final Clinical Impression(s) / ED Diagnoses Final diagnoses:  None    Rx / DC Orders ED Discharge Orders     None        Doxie Augenstein, MD 03/22/21 1015

## 2021-03-22 NOTE — H&P (Signed)
[facility] History and Physical  SCHERRY LAVERNE Amanda Armstrong:[id number] DOB: [birth year] DOA: 03/22/2021  Referring physician:  PCP: [name], MD   Chief Complaint: Hyperglycemia  HPI: Amanda Armstrong is a 75 y.o. WF PMHx anxiety, chronic pain syndrome (on chronic opioid therapy,, frequent falls, vertigo, weakness, RIGHT foot drop, multifactorial gait disorder, daytime somnolence, HTN, hypothyroidism, DM type II uncontrolled with hyperglycemia,    Review of Systems:  Covid vaccination; vaccinated 3/4  Constitutional:  No weight loss, night sweats, Fevers, chills, fatigue.  HEENT:  No headaches, Difficulty swallowing,Tooth/dental problems,Sore throat,  No sneezing, itching, ear ache, nasal congestion, post nasal drip,  Cardio-vascular:  No chest pain, Orthopnea, PND, swelling in lower extremities, anasarca, dizziness, palpitations  GI:  No heartburn, indigestion, abdominal pain, nausea, vomiting, diarrhea, change in bowel habits, loss of appetite  Resp:  No shortness of breath with exertion or at rest. No excess mucus, no productive cough, No non-productive cough, No coughing up of blood.No change in color of mucus.No wheezing.No chest wall deformity  Skin:  no rash or lesions.  GU:  no dysuria, change in color of urine, no urgency or frequency. No flank pain.  Musculoskeletal:  No joint pain or swelling. No decreased range of motion. No back pain.  Psych:  No change in mood or affect. No depression or anxiety. No memory loss.   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    pt stated that she has intermittent sx   Clostridium difficile infection    Daytime somnolence    Frequent falls    Headache(784.0)    SINUS HEADACHE OCCASSIONALLY   Hypertension    Hypothyroidism    Multifactorial gait disorder    Osteoporosis    Right foot drop     Snoring    Stroke (HCC)    Tobacco use    Vertigo    Weakness    Past Surgical History:  Procedure Laterality Date   CATARACT EXTRACTION W/PHACO Right 06/28/2016   Procedure: CATARACT EXTRACTION PHACO AND INTRAOCULAR LENS PLACEMENT (IOC);  Surgeon: Jethro Bolus, MD;  Location: [facility];  Service: Ophthalmology;  Laterality: Right;  CDE: 11.10   CATARACT EXTRACTION W/PHACO Left 07/26/2016   Procedure: CATARACT EXTRACTION PHACO AND INTRAOCULAR LENS PLACEMENT (IOC);  Surgeon: Jethro Bolus, MD;  Location: [facility];  Service: Ophthalmology;  Laterality: Left;  CDE: 8.88   JOINT REPLACEMENT  11/08/3010   right total knee   TOTAL KNEE ARTHROPLASTY  11/07/2011   Procedure: TOTAL KNEE ARTHROPLASTY;  Surgeon: Nilda Simmer, MD;  Location: [facility];  Service: Orthopedics;  Laterality: Left;  DR Anne Ng 90 MINUTES FOR THIS CASE   TOTAL KNEE ARTHROPLASTY Right 2011   TUBAL LIGATION  1982   Social History:  reports that she has been smoking cigarettes. She has a 48.00 pack-year smoking history. She has never used smokeless tobacco. She reports that she does not drink alcohol and does not use drugs.  Allergies  Allergen Reactions   Iron Other (See Comments)    "upset stomach"   Oxycodone Other (See Comments)    "CX HER TO FELL LIKE SHE IS OUT OF HER BODY"   Sudafed [Pseudoephedrine Hcl] Other (See Comments)    "funny feeling"    Family History  Problem Relation Age of Onset   Heart attack Mother  COPD Father    Arthritis Sister    Heart disease Brother     Prior to Admission medications   Medication Sig Start Date End Date Taking? Authorizing Provider  acetaminophen (TYLENOL) 325 MG tablet Take 2 tablets (650 mg total) by mouth every 6 (six) hours as needed for up to 30 doses for mild pain or moderate pain. 08/31/20   Medina-Vargas, Monina C, NP  famotidine (PEPCID) 20 MG tablet Take 1 tablet (20 mg total) by mouth 2 (two) times daily. 08/31/20   Medina-Vargas, Monina C, NP   guaiFENesin-dextromethorphan (ROBITUSSIN DM) 100-10 MG/5ML syrup Take 10 mLs by mouth every 4 (four) hours as needed for cough. 08/31/20   Medina-Vargas, Monina C, NP  levalbuterol (XOPENEX HFA) 45 MCG/ACT inhaler Inhale 2 puffs into the lungs 2 (two) times daily. 08/31/20   Medina-Vargas, Monina C, NP  levothyroxine (SYNTHROID) 75 MCG tablet Take 1 tablet (75 mcg total) by mouth daily before breakfast. 08/31/20   Medina-Vargas, Monina C, NP  meclizine (ANTIVERT) 25 MG tablet Take 1 tablet (25 mg total) by mouth 3 (three) times daily as needed for dizziness. Every 8 hours 08/31/20   Medina-Vargas, Monina C, NP  nicotine (NICODERM CQ - DOSED IN MG/24 HR) 7 mg/24hr patch Place 1 patch (7 mg total) onto the skin daily. 08/31/20   Medina-Vargas, Monina C, NP  OXYGEN Inhale 2 L/min into the lungs.    [provider]  polyethylene glycol (MIRALAX / GLYCOLAX) 17 g packet Take 17 g by mouth daily as needed. 08/31/20   Medina-Vargas, Monina C, NP  terbinafine (LAMISIL) 250 MG tablet Take 1 tablet (250 mg total) by mouth every evening. 08/31/20   Medina-Vargas, Monina C, NP  traZODone (DESYREL) 50 MG tablet Take 1 tablet (50 mg total) by mouth at bedtime. 08/31/20   Medina-Vargas, Monina C, NP  umeclidinium-vilanterol (ANORO ELLIPTA) 62.5-25 MCG/INH AEPB Inhale 1 puff into the lungs daily. 08/31/20   Medina-Vargas, Monina C, NP  vitamin C (ASCORBIC ACID) 500 MG tablet Take 1 tablet (500 mg total) by mouth daily. 08/31/20   Medina-Vargas, Monina C, NP  zinc sulfate 220 (50 Zn) MG capsule Take 1 capsule (220 mg total) by mouth daily. 08/31/20   Medina-Vargas, Margit BandaMonina C, NP     Consultants:    Procedures/Significant Events:    I have personally reviewed and interpreted all radiology studies and my findings are as above.   VENTILATOR SETTINGS:    Cultures 9/5 SARS coronavirus negative  Antimicrobials: Anti-infectives (From admission, onward)    Start     Dose/Rate Route Frequency Ordered Stop    03/22/21 0915  cefTRIAXone (ROCEPHIN) 1 g in sodium chloride 0.9 % 100 mL IVPB        1 g 200 mL/hr over 30 Minutes Intravenous  Once 03/22/21 0902 03/22/21 0954         Devices    LINES / TUBES:      Continuous Infusions:  dextrose 5% lactated ringers     insulin 7 Units/hr (03/22/21 1053)   lactated ringers     lactated ringers      Physical Exam: Vitals:   03/22/21 0930 03/22/21 1000 03/22/21 1030 03/22/21 1100  BP: (!) 127/54 126/74 139/64 122/61  Pulse: 88 86 87 67  Resp: (!) 26 (!) 24 (!) 22 (!) 21  Temp:      TempSrc:      SpO2: 95% 96% 97% 94%  Weight:      Height:  Wt Readings from Last 3 Encounters:  03/22/21 55.8 kg  08/31/20 68.8 kg  08/17/20 71.8 kg    General: A/O x4, No acute respiratory distress Eyes: negative scleral hemorrhage, negative anisocoria, negative icterus ENT: Negative Runny nose, negative gingival bleeding, Neck:  Negative scars, masses, torticollis, lymphadenopathy, JVD Lungs: Clear to auscultation bilaterally without wheezes or crackles Cardiovascular: Regular rate and rhythm without murmur gallop or rub normal S1 and S2 Abdomen: negative abdominal pain, nondistended, positive soft, bowel sounds, no rebound, no ascites, no appreciable mass Extremities: No significant cyanosis, clubbing, or edema bilateral lower extremities Skin: Negative rashes, lesions, ulcers Psychiatric:  Negative depression, negative anxiety, negative fatigue, negative mania  Central nervous system:  Cranial nerves II through XII intact, tongue/uvula midline, all extremities muscle strength 5/5, sensation intact throughout, negative dysarthria, negative expressive aphasia, negative receptive aphasia.        Labs on Admission:  Basic Metabolic Panel: Recent Labs  Lab 03/22/21 0907  NA 133*  K 5.4*  CL 92*  CO2 23  GLUCOSE 1,201*  BUN 31*  CREATININE 2.28*  CALCIUM 9.9   Liver Function Tests: Recent Labs  Lab 03/22/21 0907  AST 44*  ALT  62*  ALKPHOS 97  BILITOT 0.8  PROT 8.1  ALBUMIN 4.4   Recent Labs  Lab 03/22/21 0907  LIPASE 36   No results for input(s): AMMONIA in the last 168 hours. CBC: Recent Labs  Lab 03/22/21 0907  WBC 8.7  NEUTROABS 7.6  HGB 15.3*  HCT 49.8*  MCV 99.8  PLT 228   Cardiac Enzymes: No results for input(s): CKTOTAL, CKMB, CKMBINDEX, TROPONINI in the last 168 hours.  BNP (last 3 results) No results for input(s): BNP in the last 8760 hours.  ProBNP (last 3 results) No results for input(s): PROBNP in the last 8760 hours.  CBG: Recent Labs  Lab 03/22/21 0853  GLUCAP >600*    Radiological Exams on Admission: No results found.  EKG: Independently reviewed.   Assessment/Plan Active Problems:   Anxiety   Hypothyroidism   Lower urinary tract infectious disease   Hyperglycemia   Weakness   Tobacco abuse   Acute metabolic encephalopathy   Chronic pain syndrome   Recurrent falls   Vertigo   Multifactorial gait disorder   Right foot drop   Hyperosmolar hyperglycemic state (HHS) (HCC)  Acute metabolic encephalopathy - Multifactorial HHS, UTI, hypothyroidism, chronic pain syndrome. - Treat underlying cause  HHS - Endo tool protocol - D5-lactated Ringer's 18ml/hr -Hemoglobin A1c pending - Lipid panel pending  Lower UTI - Antibiotics x5 days - Hydration   Weakness/Recurrent falls - Suspect patient has had diabetes for some time unknown to patient.  Most likely has been having episodes of hyperglycemia/hypoglycemia.  Right foot drop - When more stable will evaluate, appears to be chronic  Hypothyroidism -Synthroid 75 mcg daily - TSH pending  Tobacco abuse - NicoDerm patch - When patient more stable will counsel on need to discontinue smoking    Code Status: DNR (DVT Prophylaxis: Subcu heparin Family Communication:   Status is: Inpatient    Dispo: The patient is from: Home              Anticipated d/c is to: Home              Anticipated d/c date  is: 3 days              Patient currently is not medically stable to d/c.     Data Reviewed: Care  during the described time interval was provided by me .  I have reviewed this patient's available data, including medical history, events of note, physical examination, and all test results as part of my evaluation.   The patient is critically ill with multiple organ systems failure and requires high complexity decision making for assessment and support, frequent evaluation and titration of therapies, application of advanced monitoring technologies and extensive interpretation of multiple databases. Critical Care Time devoted to patient care services described in this note  Time spent: 70 minutes   Blayne Frankie J [facility]

## 2021-03-22 NOTE — Progress Notes (Signed)
Inpatient Diabetes Program Recommendations  AACE/ADA: New Consensus Statement on Inpatient Glycemic Control (2015)  Target Ranges:  Prepandial:   less than 140 mg/dL      Peak postprandial:   less than 180 mg/dL (1-2 hours)      Critically ill patients:  140 - 180 mg/dL   Lab Results  Component Value Date   GLUCAP >600 (HH) 03/22/2021   HGBA1C 6.7 (H) 08/03/2020    Review of Glycemic Control  Diabetes history: DM2 Outpatient Diabetes medications: None Current orders for Inpatient glycemic control: IV insulin per EndoTool for HHS  Need updated HgbA1C. Last one 08/03/20 was 6.7%  Inpatient Diabetes Program Recommendations:    HgbA1C pending.  Pt will likely need to go home on orals or insulin. Will await HgbA1C results.   Continue to follow glucose trends.  Thank you. Ailene Ards, RD, LDN, CDE Inpatient Diabetes Coordinator [phone]

## 2021-03-22 NOTE — ED Notes (Signed)
Pt noted to have skin breakdown in pts groin area.

## 2021-03-22 NOTE — ED Notes (Addendum)
Glucose reading "Hi" on glucometer

## 2021-03-22 NOTE — Sepsis Progress Note (Addendum)
Code sepsis protocol being monitored by eLink.  Lactic acid 8.2 and blood cx were drawn at 0907.Rocephin started at 0924. IVF resuscitation ordered.

## 2021-03-23 DIAGNOSIS — G4700 Insomnia, unspecified: Secondary | ICD-10-CM | POA: Diagnosis present

## 2021-03-23 DIAGNOSIS — E1165 Type 2 diabetes mellitus with hyperglycemia: Secondary | ICD-10-CM | POA: Diagnosis present

## 2021-03-23 DIAGNOSIS — R531 Weakness: Secondary | ICD-10-CM

## 2021-03-23 DIAGNOSIS — R42 Dizziness and giddiness: Secondary | ICD-10-CM

## 2021-03-23 DIAGNOSIS — I7389 Other specified peripheral vascular diseases: Secondary | ICD-10-CM

## 2021-03-23 DIAGNOSIS — M21371 Foot drop, right foot: Secondary | ICD-10-CM

## 2021-03-23 DIAGNOSIS — E038 Other specified hypothyroidism: Secondary | ICD-10-CM

## 2021-03-23 LAB — CBC WITH DIFFERENTIAL/PLATELET
Abs Immature Granulocytes: 0.11 10*3/uL — ABNORMAL HIGH (ref 0.00–0.07)
Basophils Absolute: 0.1 10*3/uL (ref 0.0–0.1)
Basophils Relative: 0 %
Eosinophils Absolute: 0 10*3/uL (ref 0.0–0.5)
Eosinophils Relative: 0 %
HCT: 40 % (ref 36.0–46.0)
Hemoglobin: 13.1 g/dL (ref 12.0–15.0)
Immature Granulocytes: 1 %
Lymphocytes Relative: 15 %
Lymphs Abs: 2.4 10*3/uL (ref 0.7–4.0)
MCH: 30.8 pg (ref 26.0–34.0)
MCHC: 32.8 g/dL (ref 30.0–36.0)
MCV: 93.9 fL (ref 80.0–100.0)
Monocytes Absolute: 0.9 10*3/uL (ref 0.1–1.0)
Monocytes Relative: 6 %
Neutro Abs: 12.5 10*3/uL — ABNORMAL HIGH (ref 1.7–7.7)
Neutrophils Relative %: 78 %
Platelets: 210 10*3/uL (ref 150–400)
RBC: 4.26 MIL/uL (ref 3.87–5.11)
RDW: 14.6 % (ref 11.5–15.5)
WBC: 15.9 10*3/uL — ABNORMAL HIGH (ref 4.0–10.5)
nRBC: 0 % (ref 0.0–0.2)

## 2021-03-23 LAB — GLUCOSE, CAPILLARY
Glucose-Capillary: 160 mg/dL — ABNORMAL HIGH (ref 70–99)
Glucose-Capillary: 170 mg/dL — ABNORMAL HIGH (ref 70–99)
Glucose-Capillary: 177 mg/dL — ABNORMAL HIGH (ref 70–99)
Glucose-Capillary: 191 mg/dL — ABNORMAL HIGH (ref 70–99)
Glucose-Capillary: 196 mg/dL — ABNORMAL HIGH (ref 70–99)
Glucose-Capillary: 196 mg/dL — ABNORMAL HIGH (ref 70–99)
Glucose-Capillary: 206 mg/dL — ABNORMAL HIGH (ref 70–99)
Glucose-Capillary: 266 mg/dL — ABNORMAL HIGH (ref 70–99)
Glucose-Capillary: 294 mg/dL — ABNORMAL HIGH (ref 70–99)
Glucose-Capillary: 326 mg/dL — ABNORMAL HIGH (ref 70–99)

## 2021-03-23 LAB — BASIC METABOLIC PANEL
Anion gap: 5 (ref 5–15)
BUN: 22 mg/dL (ref 8–23)
CO2: 32 mmol/L (ref 22–32)
Calcium: 9.5 mg/dL (ref 8.9–10.3)
Chloride: 113 mmol/L — ABNORMAL HIGH (ref 98–111)
Creatinine, Ser: 1.44 mg/dL — ABNORMAL HIGH (ref 0.44–1.00)
GFR, Estimated: 37 mL/min — ABNORMAL LOW (ref >60)
Glucose, Bld: 187 mg/dL — ABNORMAL HIGH (ref 70–99)
Potassium: 3.5 mmol/L (ref 3.5–5.1)
Sodium: 150 mmol/L — ABNORMAL HIGH (ref 135–145)

## 2021-03-23 LAB — COMPREHENSIVE METABOLIC PANEL
ALT: 42 U/L (ref 0–44)
AST: 30 U/L (ref 15–41)
Albumin: 3.3 g/dL — ABNORMAL LOW (ref 3.5–5.0)
Alkaline Phosphatase: 65 U/L (ref 38–126)
Anion gap: 9 (ref 5–15)
BUN: 22 mg/dL (ref 8–23)
CO2: 29 mmol/L (ref 22–32)
Calcium: 9.6 mg/dL (ref 8.9–10.3)
Chloride: 111 mmol/L (ref 98–111)
Creatinine, Ser: 1.41 mg/dL — ABNORMAL HIGH (ref 0.44–1.00)
GFR, Estimated: 38 mL/min — ABNORMAL LOW (ref >60)
Glucose, Bld: 192 mg/dL — ABNORMAL HIGH (ref 70–99)
Potassium: 3.9 mmol/L (ref 3.5–5.1)
Sodium: 149 mmol/L — ABNORMAL HIGH (ref 135–145)
Total Bilirubin: 0.5 mg/dL (ref 0.3–1.2)
Total Protein: 6.2 g/dL — ABNORMAL LOW (ref 6.5–8.1)

## 2021-03-23 LAB — LIPID PANEL
Cholesterol: 112 mg/dL (ref 0–200)
HDL: 22 mg/dL — ABNORMAL LOW (ref >40)
LDL Cholesterol: 51 mg/dL (ref 0–99)
Total CHOL/HDL Ratio: 5.1 RATIO
Triglycerides: 197 mg/dL — ABNORMAL HIGH (ref <150)
VLDL: 39 mg/dL (ref 0–40)

## 2021-03-23 LAB — BETA-HYDROXYBUTYRIC ACID: Beta-Hydroxybutyric Acid: 0.16 mmol/L (ref 0.05–0.27)

## 2021-03-23 LAB — HEMOGLOBIN A1C
Hgb A1c MFr Bld: 10.9 % — ABNORMAL HIGH (ref 4.8–5.6)
Hgb A1c MFr Bld: 10.8 % — ABNORMAL HIGH (ref 4.8–5.6)
Mean Plasma Glucose: 266 mg/dL
Mean Plasma Glucose: 263.26 mg/dL

## 2021-03-23 LAB — PHOSPHORUS: Phosphorus: 2.1 mg/dL — ABNORMAL LOW (ref 2.5–4.6)

## 2021-03-23 LAB — MAGNESIUM: Magnesium: 2.3 mg/dL (ref 1.7–2.4)

## 2021-03-23 MED ORDER — INSULIN ASPART 100 UNIT/ML IJ SOLN: 0.0000 [IU] | Freq: Every day | INTRAMUSCULAR | Status: DC

## 2021-03-23 MED ORDER — VITAMIN D 25 MCG (1000 UNIT) PO TABS
2000.0000 [IU] | ORAL_TABLET | Freq: Every day | ORAL | Status: DC
  Administered 2021-03-23 – 2021-03-25 (×3): 2000 [IU] via ORAL
  Filled 2021-03-23 (×4): qty 2

## 2021-03-23 MED ORDER — INSULIN ASPART 100 UNIT/ML IJ SOLN
8.0000 [IU] | Freq: Three times a day (TID) | INTRAMUSCULAR | Status: DC
  Administered 2021-03-24 (×3): 8 [IU] via SUBCUTANEOUS

## 2021-03-23 MED ORDER — INSULIN DETEMIR 100 UNIT/ML ~~LOC~~ SOLN
20.0000 [IU] | Freq: Every day | SUBCUTANEOUS | Status: DC
  Administered 2021-03-23 – 2021-03-24 (×2): 20 [IU] via SUBCUTANEOUS
  Filled 2021-03-23 (×3): qty 0.2

## 2021-03-23 MED ORDER — INSULIN DETEMIR 100 UNIT/ML ~~LOC~~ SOLN
15.0000 [IU] | Freq: Every day | SUBCUTANEOUS | Status: DC
  Filled 2021-03-23: qty 0.15

## 2021-03-23 MED ORDER — CEFTRIAXONE SODIUM 1 G IJ SOLR
1.0000 g | INTRAMUSCULAR | Status: DC
Start: 2021-03-23 — End: 2021-03-24
  Administered 2021-03-23 – 2021-03-24 (×2): 1 g via INTRAVENOUS
  Filled 2021-03-23 (×2): qty 10

## 2021-03-23 MED ORDER — ATORVASTATIN CALCIUM 20 MG PO TABS
20.0000 mg | ORAL_TABLET | Freq: Every day | ORAL | Status: DC
  Administered 2021-03-23 – 2021-03-24 (×2): 20 mg via ORAL
  Filled 2021-03-23 (×2): qty 1

## 2021-03-23 MED ORDER — ALPRAZOLAM 0.5 MG PO TABS
1.0000 mg | ORAL_TABLET | Freq: Every day | ORAL | Status: DC
  Administered 2021-03-23 – 2021-03-24 (×2): 1 mg via ORAL
  Filled 2021-03-23 (×2): qty 2

## 2021-03-23 MED ORDER — FAMOTIDINE 20 MG PO TABS
20.0000 mg | ORAL_TABLET | Freq: Every day | ORAL | Status: DC
  Administered 2021-03-23 – 2021-03-24 (×2): 20 mg via ORAL
  Filled 2021-03-23 (×2): qty 1

## 2021-03-23 MED ORDER — INSULIN ASPART 100 UNIT/ML IJ SOLN
0.0000 [IU] | INTRAMUSCULAR | Status: DC
  Administered 2021-03-23: 0 [IU] via SUBCUTANEOUS
  Administered 2021-03-23: 5 [IU] via SUBCUTANEOUS
  Administered 2021-03-24: 1 [IU] via SUBCUTANEOUS
  Administered 2021-03-24 (×2): 2 [IU] via SUBCUTANEOUS
  Administered 2021-03-24: 1 [IU] via SUBCUTANEOUS
  Administered 2021-03-24: 3 [IU] via SUBCUTANEOUS

## 2021-03-23 MED ORDER — INSULIN ASPART 100 UNIT/ML IJ SOLN
3.0000 [IU] | Freq: Once | INTRAMUSCULAR | Status: AC
  Administered 2021-03-23: 3 [IU] via SUBCUTANEOUS

## 2021-03-23 MED ORDER — ASPIRIN EC 81 MG PO TBEC
81.0000 mg | DELAYED_RELEASE_TABLET | Freq: Every morning | ORAL | Status: DC
  Administered 2021-03-23 – 2021-03-25 (×3): 81 mg via ORAL
  Filled 2021-03-23 (×2): qty 1

## 2021-03-23 MED ORDER — LEVALBUTEROL HCL 0.63 MG/3ML IN NEBU
0.6300 mg | INHALATION_SOLUTION | Freq: Two times a day (BID) | RESPIRATORY_TRACT | Status: DC
  Administered 2021-03-24 – 2021-03-25 (×3): 0.63 mg via RESPIRATORY_TRACT
  Filled 2021-03-23 (×3): qty 3

## 2021-03-23 MED ORDER — LEVALBUTEROL HCL 0.63 MG/3ML IN NEBU
0.6300 mg | INHALATION_SOLUTION | Freq: Two times a day (BID) | RESPIRATORY_TRACT | Status: DC
  Administered 2021-03-23: 0.63 mg via RESPIRATORY_TRACT
  Filled 2021-03-23: qty 3

## 2021-03-23 MED ORDER — FAMOTIDINE 20 MG PO TABS: 20.0000 mg | ORAL_TABLET | Freq: Two times a day (BID) | ORAL | Status: DC

## 2021-03-23 MED ORDER — INSULIN DETEMIR 100 UNIT/ML ~~LOC~~ SOLN
15.0000 [IU] | Freq: Once | SUBCUTANEOUS | Status: AC
  Administered 2021-03-23: 15 [IU] via SUBCUTANEOUS
  Filled 2021-03-23: qty 0.15

## 2021-03-23 MED ORDER — INSULIN ASPART 100 UNIT/ML IJ SOLN
5.0000 [IU] | Freq: Three times a day (TID) | INTRAMUSCULAR | Status: DC
  Administered 2021-03-23: 5 [IU] via SUBCUTANEOUS

## 2021-03-23 MED ORDER — INSULIN ASPART 100 UNIT/ML IJ SOLN
0.0000 [IU] | Freq: Three times a day (TID) | INTRAMUSCULAR | Status: DC
  Administered 2021-03-23: 3 [IU] via SUBCUTANEOUS

## 2021-03-23 NOTE — Evaluation (Signed)
Clinical/Bedside Swallow Evaluation Patient Details  Name: Amanda Armstrong MRN: [id number] Date of Birth: 1945-12-28  Today's Date: 03/23/2021 Time: SLP Start Time (ACUTE ONLY): 0900 SLP Stop Time (ACUTE ONLY): 7829 SLP Time Calculation (min) (ACUTE ONLY): 23 min  Past Medical History:  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    pt stated that she has intermittent sx   Clostridium difficile infection    Daytime somnolence    Frequent falls    Headache(784.0)    SINUS HEADACHE OCCASSIONALLY   Hypertension    Hypothyroidism    Multifactorial gait disorder    Osteoporosis    Right foot drop    Snoring    Stroke (HCC)    Tobacco use    Vertigo    Weakness    Past Surgical History:  Past Surgical History:  Procedure Laterality Date   CATARACT EXTRACTION W/PHACO Right 06/28/2016   Procedure: CATARACT EXTRACTION PHACO AND INTRAOCULAR LENS PLACEMENT (IOC);  Surgeon: Jethro Bolus, MD;  Location: [facility];  Service: Ophthalmology;  Laterality: Right;  CDE: 11.10   CATARACT EXTRACTION W/PHACO Left 07/26/2016   Procedure: CATARACT EXTRACTION PHACO AND INTRAOCULAR LENS PLACEMENT (IOC);  Surgeon: Jethro Bolus, MD;  Location: [facility];  Service: Ophthalmology;  Laterality: Left;  CDE: 8.88   JOINT REPLACEMENT  11/08/3010   right total knee   TOTAL KNEE ARTHROPLASTY  11/07/2011   Procedure: TOTAL KNEE ARTHROPLASTY;  Surgeon: Nilda Simmer, MD;  Location: [facility];  Service: Orthopedics;  Laterality: Left;  DR Thurston Hole WANTS 90 MINUTES FOR THIS CASE   TOTAL KNEE ARTHROPLASTY Right 2011   TUBAL LIGATION  1982   HPI:  Amanda Armstrong is a 75 y.o. WF PMHx anxiety, chronic pain syndrome (on chronic opioid therapy,, frequent falls, vertigo, weakness, RIGHT foot drop, multifactorial gait disorder, COPD on 1L/mn at home, daytime somnolence, HTN, hypothyroidism, DM type II  uncontrolled with hyperglycemia, reports of difficulty swallowing.  Family indicating patient has had decreased oral intake for multiple months.   Assessment / Plan / Recommendation Clinical Impression  Clinical swallowing evaluation completed while Pt was sitting upright in bed; Pt denies swallowing difficulty but reports decreased appetite which has reportedly resulted in decreased PO intake over the past few weeks. Pt was assessed with thin liquids, puree textures and solid textures without overt s/sx of aspiration with any presentations. SLP reviewed universal aspiration precautions with Pt. Recommend continue with regular diet with thin liquids and meds are ok whole with liquids. There are no further ST needs noted at this time, ST will sign off. Thank you. SLP Visit Diagnosis: Dysphagia, unspecified (R13.10)    Aspiration Risk  Risk for inadequate nutrition/hydration    Diet Recommendation Regular;Thin liquid   Liquid Administration via: Cup;Straw Medication Administration: Whole meds with liquid Supervision: Patient able to self feed;Intermittent supervision to cue for compensatory strategies Compensations: Minimize environmental distractions;Slow rate;Small sips/bites Postural Changes: Seated upright at 90 degrees    Other  Recommendations Oral Care Recommendations: Oral care BID     Swallow Study   General Date of Onset: 03/22/21 HPI: Amanda Armstrong is a 75 y.o. WF PMHx anxiety, chronic pain syndrome (on chronic opioid therapy,, frequent falls, vertigo, weakness, RIGHT foot drop, multifactorial gait disorder, COPD on 1L/mn at home, daytime somnolence, HTN, hypothyroidism, DM type II uncontrolled with hyperglycemia,  reports of difficulty swallowing.  Family indicating patient has had decreased oral intake for multiple months. Type of Study: Bedside Swallow Evaluation Previous Swallow Assessment: none in chart Diet Prior to this Study: Regular;Thin liquids Temperature Spikes  Noted: No Respiratory Status: Nasal cannula History of Recent Intubation: No Behavior/Cognition: Alert;Cooperative Oral Care Completed by SLP: Recent completion by staff Oral Cavity - Dentition: Dentures, top;Dentures, bottom Vision: Functional for self-feeding Self-Feeding Abilities: Able to feed self;Needs set up Patient Positioning: Upright in bed Baseline Vocal Quality: Normal Volitional Cough: Strong Volitional Swallow: Able to elicit    Oral/Motor/Sensory Function Overall Oral Motor/Sensory Function: Within functional limits   Ice Chips Ice chips: Within functional limits   Thin Liquid Thin Liquid: Within functional limits    Nectar Thick Nectar Thick Liquid: Not tested   Honey Thick Honey Thick Liquid: Not tested   Puree Puree: Within functional limits   Solid     Solid: Within functional limits     Amanda Armstrong H. Romie Levee, CCC-SLP Speech Language Pathologist  Georgetta Haber 03/23/2021,9:38 AM

## 2021-03-23 NOTE — Progress Notes (Signed)
PROGRESS NOTE    Amanda Armstrong  SNK:[id number] DOB: 10-15-1945 DOA: 03/22/2021 PCP: [name], MD   Brief Narrative:  HPI: Amanda Armstrong is a 75 y.o. WF PMHx anxiety, chronic pain syndrome (on chronic opioid therapy,, frequent falls, vertigo, weakness, RIGHT foot drop, multifactorial gait disorder, daytime somnolence, HTN, hypothyroidism, DM type II uncontrolled with hyperglycemia   Subjective: 9/6 afebrile overnight   Assessment & Plan:  Covid vaccination;  Active Problems:   Anxiety   Hypothyroidism   Lower urinary tract infectious disease   Hyperglycemia   Weakness   Tobacco abuse   Acute metabolic encephalopathy   Chronic pain syndrome   Recurrent falls   Vertigo   Multifactorial gait disorder   Right foot drop   Hyperosmolar hyperglycemic state (HHS) (HCC)   Uncontrolled type 2 diabetes mellitus with hyperglycemia (HCC)   Insomnia   Acute metabolic encephalopathy - Multifactorial HHS, UTI, hypothyroidism, chronic pain syndrome. - Treat underlying cause   DM type II uncontrolled with hyperglycemia -08/03/2020 hemoglobin A1c= 6.7 -9/5 hemoglobin A1c= 10.9 - 9/6 hemoglobin A1c= 10.8 -9/6 consult diabetic coordinator:New onset diabetic, requires teaching, meter, plan to send patient out insulin - 9/6 consult nutritionist diabetic teaching. -9/6 increase Levemir 20 units daily - 9/6 NovoLog 8 units qac -9/6 decrease sensitive SSI  Lower UTI - Antibiotics x5 days - Hydration    Weakness/Recurrent falls - Suspect patient has had diabetes for some time unknown to patient.  Most likely has been having episodes of hyperglycemia/hypoglycemia.   Right foot drop - When more stable will evaluate, appears to be chronic   Hypothyroidism -Synthroid 75 mcg daily - TSH pending   Tobacco abuse - NicoDerm patch - When patient more stable will counsel on need to discontinue smoking  Insomnia -Restart Xanax 1 mg qhs (home dose)   DVT prophylaxis: subq  Hep Code Status: DNR Family Communication: 6/9 daughter and husband present at bedside for discussion of plan of care all questions answered Status is: Inpatient    Dispo: The patient is from: Home              Anticipated d/c is to: Home              Anticipated d/c date is: 2 days              Patient currently is not medically stable to d/c.      Consultants:  Diabetic coordinator Diabetic nutritionist  Procedures/Significant Events:     I have personally reviewed and interpreted all radiology studies and my findings are as above.  VENTILATOR SETTINGS: Nasal cannula 9/6 Flow 1 L/min SPO2 92%   Cultures   Antimicrobials: Anti-infectives (From admission, onward)    Start     Ordered Stop   03/23/21 1915  cefTRIAXone (ROCEPHIN) 1 g in sodium chloride 0.9 % 100 mL IVPB        03/23/21 1819 03/27/21 1914   03/22/21 2100  cefTRIAXone (ROCEPHIN) 1 g in sodium chloride 0.9 % 100 mL IVPB        03/22/21 2013 03/23/21 0742   03/22/21 0915  cefTRIAXone (ROCEPHIN) 1 g in sodium chloride 0.9 % 100 mL IVPB        03/22/21 0902 03/22/21 0954             Devices    LINES / TUBES:      Continuous Infusions:  cefTRIAXone (ROCEPHIN)  IV       Objective: Vitals:   03/23/21 1400  03/23/21 1500 03/23/21 1600 03/23/21 1623  BP:  (!) 130/53    Pulse: 63 62 67 (!) 55  Resp: 17 16 (!) 23 18  Temp:    98.3 F (36.8 C)  TempSrc:    Oral  SpO2: 96% 91% 93% 96%  Weight:      Height:        Intake/Output Summary (Last 24 hours) at 03/23/2021 1837 Last data filed at 03/23/2021 1804 Gross per 24 hour  Intake 2894.29 ml  Output 1250 ml  Net 1644.29 ml   Filed Weights   03/22/21 0857 03/22/21 1338  Weight: 55.8 kg 70.9 kg    Examination:  General: A/O x4, No acute respiratory distress Eyes: negative scleral hemorrhage, negative anisocoria, negative icterus ENT: Negative Runny nose, negative gingival bleeding, Neck:  Negative scars, masses, torticollis,  lymphadenopathy, JVD Lungs: Clear to auscultation bilaterally without wheezes or crackles Cardiovascular: Regular rate and rhythm without murmur gallop or rub normal S1 and S2 Abdomen: negative abdominal pain, nondistended, positive soft, bowel sounds, no rebound, no ascites, no appreciable mass Extremities: No significant cyanosis, clubbing, or edema bilateral lower extremities Skin: Negative rashes, lesions, ulcers Psychiatric:  Negative depression, negative anxiety, negative fatigue, negative mania  Central nervous system:  Cranial nerves II through XII intact, tongue/uvula midline, all extremities muscle strength 5/5, sensation intact throughout, negative dysarthria, negative expressive aphasia, negative receptive aphasia.  .     Data Reviewed: Care during the described time interval was provided by me .  I have reviewed this patient's available data, including medical history, events of note, physical examination, and all test results as part of my evaluation.   CBC: Recent Labs  Lab 03/22/21 0907 03/23/21 0441  WBC 8.7 15.9*  NEUTROABS 7.6 12.5*  HGB 15.3* 13.1  HCT 49.8* 40.0  MCV 99.8 93.9  PLT 228 210   Basic Metabolic Panel: Recent Labs  Lab 03/22/21 1508 03/22/21 1838 03/22/21 2313 03/23/21 0259 03/23/21 0441  NA 147* 149* 150* 150* 149*  K 3.5 3.1* 3.7 3.5 3.9  CL 107 111 112* 113* 111  CO2 28 29 31  32 29  GLUCOSE 703* 408* 210* 187* 192*  BUN 26* 25* 24* 22 22  CREATININE 1.79* 1.70* 1.53* 1.44* 1.41*  CALCIUM 10.1 10.0 9.7 9.5 9.6  MG  --   --   --   --  2.3  PHOS  --   --   --   --  2.1*   GFR: Estimated Creatinine Clearance: 27.9 mL/min (A) (by C-G formula based on SCr of 1.41 mg/dL (H)). Liver Function Tests: Recent Labs  Lab 03/22/21 0907 03/23/21 0441  AST 44* 30  ALT 62* 42  ALKPHOS 97 65  BILITOT 0.8 0.5  PROT 8.1 6.2*  ALBUMIN 4.4 3.3*   Recent Labs  Lab 03/22/21 0907  LIPASE 36   No results for input(s): AMMONIA in the last 168  hours. Coagulation Profile: No results for input(s): INR, PROTIME in the last 168 hours. Cardiac Enzymes: No results for input(s): CKTOTAL, CKMB, CKMBINDEX, TROPONINI in the last 168 hours. BNP (last 3 results) No results for input(s): PROBNP in the last 8760 hours. HbA1C: Recent Labs    03/22/21 0907 03/23/21 0441  HGBA1C 10.9* 10.8*   CBG: Recent Labs  Lab 03/23/21 0303 03/23/21 0352 03/23/21 0713 03/23/21 1113 03/23/21 1621  GLUCAP 160* 177* 196* 266* 326*   Lipid Profile: Recent Labs    03/22/21 0907 03/23/21 0441  CHOL 149 112  HDL 31*  22*  LDLCALC 86 51  TRIG 161* 197*  CHOLHDL 4.8 5.1   Thyroid Function Tests: No results for input(s): TSH, T4TOTAL, FREET4, T3FREE, THYROIDAB in the last 72 hours. Anemia Panel: No results for input(s): VITAMINB12, FOLATE, FERRITIN, TIBC, IRON, RETICCTPCT in the last 72 hours. Urine analysis:    Component Value Date/Time   COLORURINE YELLOW 03/22/2021 1057   APPEARANCEUR CLEAR 03/22/2021 1057   LABSPEC <1.005 (L) 03/22/2021 1057   PHURINE 5.5 03/22/2021 1057   GLUCOSEU >=500 (A) 03/22/2021 1057   HGBUR TRACE (A) 03/22/2021 1057   BILIRUBINUR NEGATIVE 03/22/2021 1057   KETONESUR NEGATIVE 03/22/2021 1057   PROTEINUR NEGATIVE 03/22/2021 1057   UROBILINOGEN 0.2 08/27/2013 1130   NITRITE NEGATIVE 03/22/2021 1057   LEUKOCYTESUR NEGATIVE 03/22/2021 1057   Sepsis Labs: @LABRCNTIP (procalcitonin:4,lacticidven:4)  ) Recent Results (from the past 240 hour(s))  Culture, blood (single)     Status: None (Preliminary result)   Collection Time: 03/22/21  9:07 AM   Specimen: Right Antecubital; Blood  Result Value Ref Range Status   Specimen Description   Final    RIGHT ANTECUBITAL BOTTLES DRAWN AEROBIC AND ANAEROBIC   Special Requests Blood Culture adequate volume  Final   Culture   Final    NO GROWTH < 24 HOURS Performed at [facility], [address], [place], [place]    Report Status PENDING  Incomplete  Resp  Panel by RT-PCR (Flu A&B, Covid) Nasopharyngeal Swab     Status: None   Collection Time: 03/22/21  9:33 AM   Specimen: Nasopharyngeal Swab; Nasopharyngeal(NP) swabs in vial transport medium  Result Value Ref Range Status   SARS Coronavirus 2 by RT PCR NEGATIVE NEGATIVE Final    Comment: (NOTE) SARS-CoV-2 target nucleic acids are NOT DETECTED.  The SARS-CoV-2 RNA is generally detectable in upper respiratory specimens during the acute phase of infection. The lowest concentration of SARS-CoV-2 viral copies this assay can detect is 138 copies/mL. A negative result does not preclude SARS-Cov-2 infection and should not be used as the sole basis for treatment or other patient management decisions. A negative result may occur with  improper specimen collection/handling, submission of specimen other than nasopharyngeal swab, presence of viral mutation(s) within the areas targeted by this assay, and inadequate number of viral copies(<138 copies/mL). A negative result must be combined with clinical observations, patient history, and epidemiological information. The expected result is Negative.  Fact Sheet for Patients:  05/22/21  Fact Sheet for Healthcare Providers:  BloggerCourse.com  This test is no t yet approved or cleared by the [employer] FDA and  has been authorized for detection and/or diagnosis of SARS-CoV-2 by FDA under an Emergency Use Authorization (EUA). This EUA will remain  in effect (meaning this test can be used) for the duration of the COVID-19 declaration under Section 564(b)(1) of the Act, 21 U.S.C.section 360bbb-3(b)(1), unless the authorization is terminated  or revoked sooner.       Influenza A by PCR NEGATIVE NEGATIVE Final   Influenza B by PCR NEGATIVE NEGATIVE Final    Comment: (NOTE) The Xpert Xpress SARS-CoV-2/FLU/RSV plus assay is intended as an aid in the diagnosis of influenza from Nasopharyngeal  swab specimens and should not be used as a sole basis for treatment. Nasal washings and aspirates are unacceptable for Xpert Xpress SARS-CoV-2/FLU/RSV testing.  Fact Sheet for Patients: [place]  Fact Sheet for Healthcare Providers: BloggerCourse.com  This test is not yet approved or cleared by the [employer] and has been authorized  for detection and/or diagnosis of SARS-CoV-2 by FDA under an Emergency Use Authorization (EUA). This EUA will remain in effect (meaning this test can be used) for the duration of the COVID-19 declaration under Section 564(b)(1) of the Act, 21 U.S.C. section 360bbb-3(b)(1), unless the authorization is terminated or revoked.  Performed at [facility], [address] [place], [place] [address]   Culture, blood (routine x 2)     Status: None (Preliminary result)   Collection Time: 03/22/21 12:04 PM   Specimen: BLOOD LEFT HAND  Result Value Ref Range Status   Specimen Description BLOOD LEFT HAND  Final   Special Requests   Final    BOTTLES DRAWN AEROBIC AND ANAEROBIC Blood Culture results may not be optimal due to an inadequate volume of blood received in culture bottles Performed at [facility], [address] [place], [place] [address]    Culture PENDING  Incomplete   Report Status PENDING  Incomplete  MRSA Next Gen by PCR, Nasal     Status: None   Collection Time: 03/22/21  1:40 PM   Specimen: Nasal Mucosa; Nasal Swab  Result Value Ref Range Status   MRSA by PCR Next Gen NOT DETECTED NOT DETECTED Final    Comment: (NOTE) The GeneXpert MRSA Assay (FDA approved for NASAL specimens only), is one component of a comprehensive MRSA colonization surveillance program. It is not intended to diagnose MRSA infection nor to guide or monitor treatment for MRSA infections. Test performance is not FDA approved in patients less than 77 years old. Performed at [facility], [address]  [place], [place] [address]          Radiology Studies: No results found.      Scheduled Meds:  ALPRAZolam  1 mg Oral QHS   aspirin EC  81 mg Oral q AM   atorvastatin  20 mg Oral Q1200   Chlorhexidine Gluconate Cloth  6 each Topical Daily   cholecalciferol  2,000 Units Oral Daily   famotidine  20 mg Oral QHS   heparin injection (subcutaneous)  5,000 Units Subcutaneous Q8H   insulin aspart  0-9 Units Subcutaneous Q4H   [START ON 03/24/2021] insulin aspart  8 Units Subcutaneous TID WC   insulin detemir  20 Units Subcutaneous QHS   levalbuterol  0.63 mg Inhalation BID   levothyroxine  75 mcg Oral QAC breakfast   nicotine  7 mg Transdermal Daily   Continuous Infusions:  cefTRIAXone (ROCEPHIN)  IV       LOS: 1 day   The patient is critically ill with multiple organ systems failure and requires high complexity decision making for assessment and support, frequent evaluation and titration of therapies, application of advanced monitoring technologies and extensive interpretation of multiple databases. Critical Care Time devoted to patient care services described in this note  Time spent: 40 minutes     Lesette Frary, Roselind MessierURTIS J, MD [facility]   If 7PM-7AM, please contact night-coverage 03/23/2021, 6:37 PM

## 2021-03-24 LAB — CBC WITH DIFFERENTIAL/PLATELET
Abs Immature Granulocytes: 0.07 10*3/uL (ref 0.00–0.07)
Basophils Absolute: 0.1 10*3/uL (ref 0.0–0.1)
Basophils Relative: 1 %
Eosinophils Absolute: 0.1 10*3/uL (ref 0.0–0.5)
Eosinophils Relative: 1 %
HCT: 43.6 % (ref 36.0–46.0)
Hemoglobin: 13.7 g/dL (ref 12.0–15.0)
Immature Granulocytes: 1 %
Lymphocytes Relative: 24 %
Lymphs Abs: 2.3 10*3/uL (ref 0.7–4.0)
MCH: 30.9 pg (ref 26.0–34.0)
MCHC: 31.4 g/dL (ref 30.0–36.0)
MCV: 98.2 fL (ref 80.0–100.0)
Monocytes Absolute: 0.5 10*3/uL (ref 0.1–1.0)
Monocytes Relative: 5 %
Neutro Abs: 6.8 10*3/uL (ref 1.7–7.7)
Neutrophils Relative %: 68 %
Platelets: 171 10*3/uL (ref 150–400)
RBC: 4.44 MIL/uL (ref 3.87–5.11)
RDW: 14.7 % (ref 11.5–15.5)
WBC: 9.9 10*3/uL (ref 4.0–10.5)
nRBC: 0 % (ref 0.0–0.2)

## 2021-03-24 LAB — PHOSPHORUS: Phosphorus: 3.1 mg/dL (ref 2.5–4.6)

## 2021-03-24 LAB — GLUCOSE, CAPILLARY
Glucose-Capillary: 188 mg/dL — ABNORMAL HIGH (ref 70–99)
Glucose-Capillary: 141 mg/dL — ABNORMAL HIGH (ref 70–99)
Glucose-Capillary: 134 mg/dL — ABNORMAL HIGH (ref 70–99)
Glucose-Capillary: 214 mg/dL — ABNORMAL HIGH (ref 70–99)
Glucose-Capillary: 184 mg/dL — ABNORMAL HIGH (ref 70–99)
Glucose-Capillary: 275 mg/dL — ABNORMAL HIGH (ref 70–99)
Glucose-Capillary: 166 mg/dL — ABNORMAL HIGH (ref 70–99)

## 2021-03-24 LAB — COMPREHENSIVE METABOLIC PANEL
ALT: 44 U/L (ref 0–44)
AST: 44 U/L — ABNORMAL HIGH (ref 15–41)
Albumin: 3.1 g/dL — ABNORMAL LOW (ref 3.5–5.0)
Alkaline Phosphatase: 62 U/L (ref 38–126)
Anion gap: 7 (ref 5–15)
BUN: 19 mg/dL (ref 8–23)
CO2: 29 mmol/L (ref 22–32)
Calcium: 8.8 mg/dL — ABNORMAL LOW (ref 8.9–10.3)
Chloride: 107 mmol/L (ref 98–111)
Creatinine, Ser: 1.28 mg/dL — ABNORMAL HIGH (ref 0.44–1.00)
GFR, Estimated: 43 mL/min — ABNORMAL LOW (ref >60)
Glucose, Bld: 159 mg/dL — ABNORMAL HIGH (ref 70–99)
Potassium: 3.5 mmol/L (ref 3.5–5.1)
Sodium: 143 mmol/L (ref 135–145)
Total Bilirubin: 0.6 mg/dL (ref 0.3–1.2)
Total Protein: 5.8 g/dL — ABNORMAL LOW (ref 6.5–8.1)

## 2021-03-24 LAB — MAGNESIUM: Magnesium: 2.4 mg/dL (ref 1.7–2.4)

## 2021-03-24 MED ORDER — PEN NEEDLES 3/16" 31G X 5 MM MISC
100.0000 | Freq: Two times a day (BID) | 1 refills | Status: DC
Start: 2021-03-24 — End: 2023-07-31

## 2021-03-24 MED ORDER — BLOOD GLUCOSE METER KIT: PACK | 0 refills | Status: DC

## 2021-03-24 MED ORDER — HUMULIN 70/30 KWIKPEN (70-30) 100 UNIT/ML ~~LOC~~ SUPN: 12.0000 [IU] | PEN_INJECTOR | Freq: Two times a day (BID) | SUBCUTANEOUS | 0 refills | Status: DC

## 2021-03-24 MED ORDER — INSULIN STARTER KIT- PEN NEEDLES (ENGLISH)
1.0000 | Freq: Once | Status: AC
  Administered 2021-03-24: 1
  Filled 2021-03-24: qty 1

## 2021-03-24 MED ORDER — "PEN NEEDLES 3/16"" 31G X 5 MM MISC": 100.0000 | Freq: Two times a day (BID) | 1 refills | Status: DC

## 2021-03-24 MED ORDER — NOVOLIN 70/30 FLEXPEN RELION (70-30) 100 UNIT/ML ~~LOC~~ SUPN: 12.0000 [IU] | PEN_INJECTOR | Freq: Two times a day (BID) | SUBCUTANEOUS | 0 refills | Status: DC

## 2021-03-24 MED ORDER — AMOXICILLIN 250 MG PO CAPS
500.0000 mg | ORAL_CAPSULE | Freq: Two times a day (BID) | ORAL | Status: DC
  Administered 2021-03-24 – 2021-03-25 (×2): 500 mg via ORAL
  Filled 2021-03-24 (×2): qty 2

## 2021-03-24 MED ORDER — BLOOD GLUCOSE METER KIT: PACK | 0 refills | Status: AC

## 2021-03-24 MED ORDER — NOVOLIN 70/30 (70-30) 100 UNIT/ML ~~LOC~~ SUSP: 12.0000 [IU] | Freq: Two times a day (BID) | SUBCUTANEOUS | 0 refills | Status: DC

## 2021-03-24 MED ORDER — INSULIN ASPART 100 UNIT/ML IJ SOLN
0.0000 [IU] | Freq: Three times a day (TID) | INTRAMUSCULAR | Status: DC
  Administered 2021-03-24: 5 [IU] via SUBCUTANEOUS
  Administered 2021-03-25: 1 [IU] via SUBCUTANEOUS

## 2021-03-24 MED ORDER — LIVING WELL WITH DIABETES BOOK
Freq: Once | Status: AC
  Administered 2021-03-24: 1

## 2021-03-24 MED ORDER — CEPHALEXIN 500 MG PO CAPS: 500.0000 mg | ORAL_CAPSULE | Freq: Four times a day (QID) | ORAL | 0 refills | Status: DC

## 2021-03-24 MED ORDER — AMOXICILLIN 500 MG PO CAPS: 500.0000 mg | ORAL_CAPSULE | Freq: Two times a day (BID) | ORAL | 0 refills | Status: AC

## 2021-03-24 NOTE — Plan of Care (Signed)

## 2021-03-24 NOTE — Progress Notes (Signed)
PROGRESS NOTE    Amanda Armstrong  MRN:[id number] DOB: 04/04/1946 DOA: 03/22/2021 PCP: Fusco, [name], MD   Brief Narrative:  75 y.o. WF PMHx anxiety, chronic pain syndrome (on chronic opioid therapy,, frequent falls, vertigo, weakness, RIGHT foot drop, multifactorial gait disorder, daytime somnolence, HTN, hypothyroidism, DM type II uncontrolled with hyperglycemia.  Eventually he A1c resulted greater than 10.0.  Diabetic coordinator recommended 70/30 insulin.  Patient was also diagnosed with UTI which eventually grew Enterococcus faecalis therefore transition IV Rocephin to amoxicillin.  Due to issues with family education and their insulin prescription with pharmacy, discharge being held.      Assessment & Plan:   Active Problems:   Anxiety   Hypothyroidism   Lower urinary tract infectious disease   Hyperglycemia   Weakness   Tobacco abuse   Acute metabolic encephalopathy   Chronic pain syndrome   Recurrent falls   Vertigo   Multifactorial gait disorder   Right foot drop   Hyperosmolar hyperglycemic state (HHS) (HCC)   Uncontrolled type 2 diabetes mellitus with hyperglycemia (HCC)   Insomnia   Acute metabolic encephalopathy - Resolved   DM type II uncontrolled with hyperglycemia - Her A1c is 10.8.  Seen by diabetic coordinator.  Patient will be discharged on Humulin 70/30 12 units twice daily, endocrine outpatient referral.  Education to be provided by the nursing staff   Lower UTI, Enterococcus faecalis - Transition IV Rocephin to oral amoxicillin   Weakness/Recurrent falls - Suspect patient has had diabetes for some time unknown to patient.  Most likely has been having episodes of hyperglycemia/hypoglycemia..  PT/OT recommended SNF otherwise home with supervision.  Family opted to go home.   Right foot drop - When more stable will evaluate, appears to be chronic   Hypothyroidism -Synthroid 75 mcg daily   Tobacco abuse - NicoDerm patch - When patient more stable  will counsel on need to discontinue smoking   Insomnia -Restart Xanax 1 mg qhs (home dose)      DVT prophylaxis: heparin injection 5,000 Units Start: 03/22/21 1400  Code Status: DNR Family Communication: Daughter and husband  Status is: Inpatient  Remains inpatient appropriate because:Inpatient level of care appropriate due to severity of illness  Dispo: The patient is from: Home              Anticipated d/c is to: Home              Patient was medically stable to go home today but due to issues with filling her prescription at the certain pharmacy and not having medications and supplies ready on time today, daughter does not feel comfortable taking the patient home.  Nursing staff to continue education overnight in regards to insulin administration.   Difficult to place patient No       Nutritional status  Nutrition Problem: Limited adherence to nutrition-related recommendations Etiology: chronic illness (diabetes)  Signs/Symptoms:  (9/6 A1C-10.8% and in January 6.7%)  Interventions:  (education- diabetes)  Body mass index is 32.67 kg/m.           Subjective: Patient has no new complaints today.  Review of Systems Otherwise negative except as per HPI, including: General: Denies fever, chills, night sweats or unintended weight loss. Resp: Denies cough, wheezing, shortness of breath. Cardiac: Denies chest pain, palpitations, orthopnea, paroxysmal nocturnal dyspnea. GI: Denies abdominal pain, nausea, vomiting, diarrhea or constipation GU: Denies dysuria, frequency, hesitancy or incontinence MS: Denies muscle aches, joint pain or swelling Neuro: Denies headache, neurologic deficits (focal   weakness, numbness, tingling), abnormal gait Psych: Denies anxiety, depression, SI/HI/AVH Skin: Denies new rashes or lesions ID: Denies sick contacts, exotic exposures, travel  Examination:  General exam: Appears calm and comfortable, chronically on 2 L nasal  cannula Respiratory system: Clear to auscultation. Respiratory effort normal. Cardiovascular system: S1 & S2 heard, RRR. No JVD, murmurs, rubs, gallops or clicks. No pedal edema. Gastrointestinal system: Abdomen is nondistended, soft and nontender. No organomegaly or masses felt. Normal bowel sounds heard. Central nervous system: Alert and oriented. No focal neurological deficits. Extremities: Symmetric 5 x 5 power. Skin: No rashes, lesions or ulcers Psychiatry: Judgement and insight appear normal. Mood & affect appropriate.     Objective: Vitals:   03/24/21 1100 03/24/21 1114 03/24/21 1345 03/24/21 1515  BP: (!) 109/57  108/69 118/68  Pulse: 67 68 81 76  Resp: (!) _0 (!) 23  Temp:      TempSrc:      SpO2: 97% 97% 97% 98%  Weight:      Height:        Intake/Output Summary (Last 24 hours) at 03/24/2021 1638 Last data filed at 03/24/2021 0400 Gross per 24 hour  Intake 399.44 ml  Output 450 ml  Net -50.56 ml   Filed Weights   03/22/21 0857 03/22/21 1338  Weight: 55.8 kg 70.9 kg     Data Reviewed:   CBC: Recent Labs  Lab 03/22/21 0907 03/23/21 0441 03/24/21 0451  WBC 8.7 15.9* 9.9  NEUTROABS 7.6 12.5* 6.8  HGB 15.3* 13.1 13.7  HCT 49.8* 40.0 43.6  MCV 99.8 93.9 98.2  PLT 228 210 086   Basic Metabolic Panel: Recent Labs  Lab 03/22/21 1838 03/22/21 2313 03/23/21 0259 03/23/21 0441 03/24/21 0451  NA 149* 150* 150* 149* 143  K 3.1* 3.7 3.5 3.9 3.5  CL 111 112* 113* 111 107  CO2 29 31 32 29 29  GLUCOSE 408* 210* 187* 192* 159*  BUN 25* 24* _1 CREATININE 1.70* 1.53* 1.44* 1.41* 1.28*  CALCIUM 10.0 9.7 9.5 9.6 8.8*  MG  --   --   --  2.3 2.4  PHOS  --   --   --  2.1* 3.1   GFR: Estimated Creatinine Clearance: 30.7 mL/min (A) (by C-G formula based on SCr of 1.28 mg/dL (H)). Liver Function Tests: Recent Labs  Lab 03/22/21 0907 03/23/21 0441 03/24/21 0451  AST 44* 30 44*  ALT 62* 42 44  ALKPHOS 97 65 62  BILITOT 0.8 0.5 0.6  PROT 8.1 6.2*  5.8*  ALBUMIN 4.4 3.3* 3.1*   Recent Labs  Lab 03/22/21 0907  LIPASE 36   No results for input(s): AMMONIA in the last 168 hours. Coagulation Profile: No results for input(s): INR, PROTIME in the last 168 hours. Cardiac Enzymes: No results for input(s): CKTOTAL, CKMB, CKMBINDEX, TROPONINI in the last 168 hours. BNP (last 3 results) No results for input(s): PROBNP in the last 8760 hours. HbA1C: Recent Labs    03/22/21 0907 03/23/21 0441  HGBA1C 10.9* 10.8*   CBG: Recent Labs  Lab 03/23/21 2340 03/24/21 0127 03/24/21 0433 03/24/21 0759 03/24/21 1137  GLUCAP 196* 188* 141* 134* 214*   Lipid Profile: Recent Labs    03/22/21 0907 03/23/21 0441  CHOL 149 112  HDL 31* 22*  LDLCALC 86 51  TRIG 161* 197*  CHOLHDL 4.8 5.1   Thyroid Function Tests: No results for input(s): TSH, T4TOTAL, FREET4, T3FREE, THYROIDAB in the last 72 hours. Anemia Panel: No  results for input(s): VITAMINB12, FOLATE, FERRITIN, TIBC, IRON, RETICCTPCT in the last 72 hours. Sepsis Labs: Recent Labs  Lab 03/22/21 0907 03/22/21 1204  LATICACIDVEN 8.2* 5.2*    Recent Results (from the past 240 hour(s))  Culture, blood (single)     Status: None (Preliminary result)   Collection Time: 03/22/21  9:07 AM   Specimen: Right Antecubital; Blood  Result Value Ref Range Status   Specimen Description   Final    RIGHT ANTECUBITAL BOTTLES DRAWN AEROBIC AND ANAEROBIC   Special Requests Blood Culture adequate volume  Final   Culture   Final    NO GROWTH 2 DAYS Performed at [facility], [address] [place], [place] [address]    Report Status PENDING  Incomplete  Resp Panel by RT-PCR (Flu A&B, Covid) Nasopharyngeal Swab     Status: None   Collection Time: 03/22/21  9:33 AM   Specimen: Nasopharyngeal Swab; Nasopharyngeal(NP) swabs in vial transport medium  Result Value Ref Range Status   SARS Coronavirus 2 by RT PCR NEGATIVE NEGATIVE Final    Comment: (NOTE) SARS-CoV-2 target nucleic acids are NOT  DETECTED.  The SARS-CoV-2 RNA is generally detectable in upper respiratory specimens during the acute phase of infection. The lowest concentration of SARS-CoV-2 viral copies this assay can detect is 138 copies/mL. A negative result does not preclude SARS-Cov-2 infection and should not be used as the sole basis for treatment or other patient management decisions. A negative result may occur with  improper specimen collection/handling, submission of specimen other than nasopharyngeal swab, presence of viral mutation(s) within the areas targeted by this assay, and inadequate number of viral copies(<138 copies/mL). A negative result must be combined with clinical observations, patient history, and epidemiological information. The expected result is Negative.  Fact Sheet for Patients:  https://www.fda.gov/media/152166/download  Fact Sheet for Healthcare Providers:  https://www.fda.gov/media/152162/download  This test is no t yet approved or cleared by the [place] FDA and  has been authorized for detection and/or diagnosis of SARS-CoV-2 by FDA under an Emergency Use Authorization (EUA). This EUA will remain  in effect (meaning this test can be used) for the duration of the COVID-19 declaration under Section 564(b)(1) of the Act, 21 U.S.C.section 360bbb-3(b)(1), unless the authorization is terminated  or revoked sooner.       Influenza A by PCR NEGATIVE NEGATIVE Final   Influenza B by PCR NEGATIVE NEGATIVE Final    Comment: (NOTE) The Xpert Xpress SARS-CoV-2/FLU/RSV plus assay is intended as an aid in the diagnosis of influenza from Nasopharyngeal swab specimens and should not be used as a sole basis for treatment. Nasal washings and aspirates are unacceptable for Xpert Xpress SARS-CoV-2/FLU/RSV testing.  Fact Sheet for Patients: https://www.fda.gov/media/152166/download  Fact Sheet for Healthcare Providers: https://www.fda.gov/media/152162/download  This test is not yet  approved or cleared by the [place] FDA and has been authorized for detection and/or diagnosis of SARS-CoV-2 by FDA under an Emergency Use Authorization (EUA). This EUA will remain in effect (meaning this test can be used) for the duration of the COVID-19 declaration under Section 564(b)(1) of the Act, 21 U.S.C. section 360bbb-3(b)(1), unless the authorization is terminated or revoked.  Performed at [facility], [address] ,  [address]   Urine Culture     Status: Abnormal (Preliminary result)   Collection Time: 03/22/21 10:57 AM   Specimen: Urine, Clean Catch  Result Value Ref Range Status   Specimen Description   Final    URINE, CLEAN CATCH Performed at [facility], [address] [place], [place] [address]    Special Requests   Final    NONE Performed at [facility], [address] [place], [place] [address]    Culture >=100,000 COLONIES/mL ENTEROCOCCUS FAECALIS (A)  Final   Report Status PENDING  Incomplete  Culture, blood (routine x 2)     Status: None (Preliminary result)   Collection Time: 03/22/21 12:04 PM   Specimen: BLOOD LEFT HAND  Result Value Ref Range Status   Specimen Description BLOOD LEFT HAND  Final   Special Requests   Final    BOTTLES DRAWN AEROBIC AND ANAEROBIC Blood Culture results may not be optimal due to an inadequate volume of blood received in culture bottles Performed at [facility], [address] [place], [place] [address]    Culture PENDING  Incomplete   Report Status PENDING  Incomplete  MRSA Next Gen by PCR, Nasal     Status: None   Collection Time: 03/22/21  1:40 PM   Specimen: Nasal Mucosa; Nasal Swab  Result Value Ref Range Status   MRSA by PCR Next Gen NOT DETECTED NOT DETECTED Final    Comment: (NOTE) The GeneXpert MRSA Assay (FDA approved for NASAL specimens only), is one component of a comprehensive MRSA colonization surveillance program. It is not intended to diagnose MRSA infection nor to guide or monitor  treatment for MRSA infections. Test performance is not FDA approved in patients less than 2 years old. Performed at [facility], [address] [place], [place] [address]          Radiology Studies: No results found.      Scheduled Meds:  ALPRAZolam  1 mg Oral QHS   amoxicillin  500 mg Oral Q12H   aspirin EC  81 mg Oral q AM   atorvastatin  20 mg Oral Q1200   Chlorhexidine Gluconate Cloth  6 each Topical Daily   cholecalciferol  2,000 Units Oral Daily   famotidine  20 mg Oral QHS   heparin injection (subcutaneous)  5,000 Units Subcutaneous Q8H   insulin aspart  0-9 Units Subcutaneous Q4H   insulin aspart  8 Units Subcutaneous TID WC   insulin detemir  20 Units Subcutaneous QHS   insulin starter kit- pen needles  1 kit Other Once   levalbuterol  0.63 mg Inhalation BID   levothyroxine  75 mcg Oral QAC breakfast   nicotine  7 mg Transdermal Daily   Continuous Infusions:   LOS: 2 days   Time spent= 35 mins    Ankit Chirag Amin, MD [facility]  If 7PM-7AM, please contact night-coverage  03/24/2021, 4:38 PM   

## 2021-03-24 NOTE — Plan of Care (Signed)
  Problem: Acute Rehab PT Goals(only PT should resolve) Goal: Pt Will Go Supine/Side To Sit Outcome: Progressing Flowsheets (Taken 03/24/2021 1354) Pt will go Supine/Side to Sit: with min guard assist Goal: Patient Will Transfer Sit To/From Stand Outcome: Progressing Flowsheets (Taken 03/24/2021 1354) Patient will transfer sit to/from stand:  with min guard assist  with minimal assist Goal: Pt Will Transfer Bed To Chair/Chair To Bed Outcome: Progressing Flowsheets (Taken 03/24/2021 1354) Pt will Transfer Bed to Chair/Chair to Bed: with min assist Goal: Pt Will Ambulate Outcome: Progressing Flowsheets (Taken 03/24/2021 1354) Pt will Ambulate:  25 feet  with minimal assist  with rolling walker   1:55 PM, 03/24/21 Ocie Bob, MPT Physical Therapist with [facility] [phone] office [phone] mobile phone

## 2021-03-24 NOTE — Plan of Care (Signed)
°  Problem: Education: °Goal: Knowledge of General Education information will improve °Description: Including pain rating scale, medication(s)/side effects and non-pharmacologic comfort measures °Outcome: Progressing °  °Problem: Health Behavior/Discharge Planning: °Goal: Ability to manage health-related needs will improve °Outcome: Progressing °  °Problem: Clinical Measurements: °Goal: Ability to maintain clinical measurements within normal limits will improve °Outcome: Progressing °Goal: Will remain free from infection °Outcome: Progressing °Goal: Diagnostic test results will improve °Outcome: Progressing °Goal: Respiratory complications will improve °Outcome: Progressing °Goal: Cardiovascular complication will be avoided °Outcome: Progressing °  °Problem: Coping: °Goal: Level of anxiety will decrease °Outcome: Progressing °  °Problem: Nutrition: °Goal: Adequate nutrition will be maintained °Outcome: Progressing °  °Problem: Activity: °Goal: Risk for activity intolerance will decrease °Outcome: Progressing °  °Problem: Elimination: °Goal: Will not experience complications related to bowel motility °Outcome: Progressing °Goal: Will not experience complications related to urinary retention °Outcome: Progressing °  °Problem: Pain Managment: °Goal: General experience of comfort will improve °Outcome: Progressing °  °Problem: Safety: °Goal: Ability to remain free from injury will improve °Outcome: Progressing °  °Problem: Skin Integrity: °Goal: Risk for impaired skin integrity will decrease °Outcome: Progressing °  °

## 2021-03-24 NOTE — Progress Notes (Signed)
Initial Nutrition Assessment  DOCUMENTATION CODES:   Obesity unspecified  INTERVENTION:  Diabetes Diet education completed.    NUTRITION DIAGNOSIS:   Limited adherence to nutrition-related recommendations related to chronic illness (diabetes) as evidenced by  (9/6 A1C-10.8% and in January 6.7%).   GOAL: Patient will comply with diet and medication regimen for management of her diabetes disease.    REASON FOR ASSESSMENT:   Consult Assessment of nutrition requirement/status, Diet education  ASSESSMENT: Patient is an obese  75 yo female with uncontrolled diabetes. History of falls, chronic pain, HTN.  Medications reviewed. Expecting to discharge today. Provided copy of Diabetes Plate Method handout and review of basics. Patient eats oatmeal and drinks coffee usually for breakfast.   We talked about meal planning and portion control and encouraged consistent intake of protein each meal.  Patient intake not documented. Review of weights -stable between 69-72 kg the past 6+ months.  Labs: 9/6-A1C-10.8%  in January A1C-6.7% BMP Latest Ref Rng & Units 03/24/2021 03/23/2021 03/23/2021  Glucose 70 - 99 mg/dL 269(S) 854(O) 270(J)  BUN 8 - 23 mg/dL 19 22 22   Creatinine 0.44 - 1.00 mg/dL ) 5.00(X) 3.81(W)  Sodium 135 - 145 mmol/L 143 149(H) 150(H)  Potassium 3.5 - 5.1 mmol/L 3.5 3.9 3.5  Chloride 98 - 111 mmol/L 107 111 113(H)  CO2 22 - 32 mmol/L 29 29 32  Calcium 8.9 - 10.3 mg/dL 2.99(B) 9.6 9.5   Diet Order:   Diet Order             Diet Carb Modified Fluid consistency: Thin; Room service appropriate? Yes  Diet effective now                   EDUCATION NEEDS:  Education needs have been addressed  Skin:  Skin Assessment: Reviewed RN Assessment  Last BM:  9/6 type 6  Height:   Ht Readings from Last 1 Encounters:  03/22/21 4\' 10"  (1.473 m)    Weight:   Wt Readings from Last 1 Encounters:  03/22/21 70.9 kg    Ideal Body Weight:   45 kg  BMI:  Body mass  index is 32.67 kg/m.  Estimated Nutritional Needs:   Kcal:  1500-1600  Protein:  70-78 gr  Fluid:  1.6 liters daily   MS,RD,CSG,LDN Contact: 05/22/21

## 2021-03-24 NOTE — TOC Transition Note (Signed)
Transition of Care [facility]) - CM/SW Discharge Note   Patient Details  Name: Amanda Armstrong MRN: [id number] Date of Birth: June 22, 1946  Transition of Care [facility]) CM/SW Contact:  Leitha Bleak, RN Phone Number: 03/24/2021, 1:44 PM   Clinical Narrative:   Patient admitted with hypothyroidism. PT evaluation patient needs assistance. TOC spoke with her daughter. She is refusing SNF. She wants her home. She has worked hard and finally got her services set up in the home. She is [facility] with [facility] with a aide Mon- Friday 8-4. Family is staying in the evenings. [facility], TOC asked MD for orders. Patient finishing diabetic education before discharge.    Final next level of care: Home w Home Health Services Barriers to Discharge: Barriers Resolved  Patient Goals and CMS Choice Patient states their goals for this hospitalization and ongoing recovery are:: to go home. CMS Medicare.gov Compare Post Acute Care list provided to:: Patient Represenative (must comment) Choice offered to / list presented to : Adult Children  Discharge Placement             Patient chooses bed at:  (Home) Patient to be transferred to facility by: family Name of family member notified: Daughter Patient and family notified of of transfer: 03/24/21  Discharge Plan and Services    Readmission Risk Interventions Readmission Risk Prevention Plan 03/24/2021  Post Dischage Appt Complete  Medication Screening Complete  Transportation Screening Complete  Some recent data might be hidden

## 2021-03-24 NOTE — Progress Notes (Addendum)
Living well with diabetes booklet/info given to patient and patient family (husband and daughter) to go over. Education on how to check blood sugar with CBG machine gone over and demonstrated with patient. Patient expressed understanding with teach back. Patient does need reinforcement on when and how to check BG as she can be forgetful as to when to check BG. Writer went over insulin pen teaching with patient demonstrating how to set insulin and inject. Patient will need more practice but states she understands. Patient did give herself her dinnertime coverage insulin and injected insulin in to her left arm with good technique for being first time. Patient expressed compliance in wanting to give herself bedtime insulin later. Will pass on to next nurse to go over insulin pen injection again also as patient needs more teaching.

## 2021-03-24 NOTE — TOC Progression Note (Signed)
Transition of Care [facility]) - Progression Note    Patient Details  Name: DANELL VAZQUEZ MRN: [id number] Date of Birth: 23-Aug-1945  [facility]) CM/SW Contact  Leitha Bleak, RN Phone Number: 03/24/2021, 4:04 PM  Clinical Narrative:   Daughter wanted meds called into a pharmacy that delivers. Meds were called in. Per daughter, Patient is needing different test strips and glucometer. MD provided written script for glucometer and TOC explained to daughter. Daughter is still upset. MD holding discharge to and advised RN to provide more education and allow patient to administer insulin. Daughter states she has to know discharge plan to have sitter lined up at home. TOC advise to have home team ready tomorrow, we plan to discharge in the morning.

## 2021-03-24 NOTE — Progress Notes (Signed)
Inpatient Diabetes Program Recommendations  AACE/ADA: New Consensus Statement on Inpatient Glycemic Control (2015)  Target Ranges:  Prepandial:   less than 140 mg/dL      Peak postprandial:   less than 180 mg/dL (1-2 hours)      Critically ill patients:  140 - 180 mg/dL   Lab Results  Component Value Date   GLUCAP 134 (H) 03/24/2021   HGBA1C 10.8 (H) 03/23/2021    Review of Glycemic Control  Diabetes history: DM2 Outpatient Diabetes medications: None Current orders for Inpatient glycemic control: Levemir 20 units QHS, Novolog 0-9 Q4H + 8 units TID  HgbA1C - 10.8% Ordered Living Well book and insulin pen starter kit  Inpatient Diabetes Program Recommendations:    For home:  Novolin 70/30 12 units BID Metformin 500 mg BID  Spoke with daughter at length about pt going home on insulin. RN to teach insulin pen administration. Will need to check blood sugars 3x/day (ac B and D, and HS) Discussed hypoglycemia s/s and treatment. Will need to f/u with PCP within a week or two and take blood sugar log to appt. Daughter states pt does not drink water, only drinks Mellow Yello and juice. Dtr to change soda to diet, and eliminate juice. Eats 3 meals/day with fried foods, although very little sweets. Stressed importance of eating and not skipping meals. Discussed HgbA1C of 10.8% and importance of getting it down to < 8%.  Sent video of insulin pen teaching to pt's phone. Answered questions.   Thank you. Lorenda Peck, RD, LDN, CDE Inpatient Diabetes Coordinator [phone]

## 2021-03-24 NOTE — Evaluation (Signed)
Physical Therapy Evaluation Patient Details Name: Amanda Armstrong MRN: [id number] DOB: 22-May-1946 Today's Date: 03/24/2021   History of Present Illness  Amanda Armstrong is a 75 y.o. WF PMHx anxiety, chronic pain syndrome (on chronic opioid therapy,, frequent falls, vertigo, weakness, RIGHT foot drop, multifactorial gait disorder, daytime somnolence, HTN, hypothyroidism, DM type II uncontrolled with hyperglycemia,   Clinical Impression  Patient demonstrates slow labored movement for sitting up at bedside with frequent leaning/falling over to the left, unsteady on feet and at high risk for falls, limited to a few side steps before having to sit due to fatigue.  Patient tolerated sitting up in chair after therapy - RN notified.  Patient will benefit from continued physical therapy in hospital and recommended venue below to increase strength, balance, endurance for safe ADLs and gait.       Follow Up Recommendations SNF;Supervision for mobility/OOB;Supervision - Intermittent    Equipment Recommendations  None recommended by PT    Recommendations for Other Services       Precautions / Restrictions Precautions Precautions: Fall Restrictions Weight Bearing Restrictions: No      Mobility  Bed Mobility Overal bed mobility: Needs Assistance Bed Mobility: Supine to Sit     Supine to sit: Mod assist     General bed mobility comments: slow labored movment with frequent leaning to the left    Transfers Overall transfer level: Needs assistance Equipment used: Rolling walker (2 wheeled) Transfers: Sit to/from [employer] Sit to Stand: Min assist;Mod assist Stand pivot transfers: Mod assist       General transfer comment: slow labored movement with leaning to the left  Ambulation/Gait Ambulation/Gait assistance: Mod assist Gait Distance (Feet): 5 Feet Assistive device: Rolling walker (2 wheeled) Gait Pattern/deviations: Decreased step length - right;Decreased  step length - left;Decreased stride length;Trunk flexed Gait velocity: decreased   General Gait Details: limited to a few slow labored unsteady side steps with left lateral leaning, limited mostly due to fatigue and poor standing balance  Stairs            Wheelchair Mobility    Modified Rankin (Stroke Patients Only)       Balance Overall balance assessment: Needs assistance Sitting-balance support: Feet supported;No upper extremity supported Sitting balance-Leahy Scale: Poor Sitting balance - Comments: fair/poor seated at EOB with left lateral leaning Postural control: Left lateral lean Standing balance support: During functional activity;Bilateral upper extremity supported Standing balance-Leahy Scale: Poor Standing balance comment: fair/poor using RW                             Pertinent Vitals/Pain Pain Assessment: No/denies pain    Home Living Family/patient expects to be discharged to:: Private residence   Available Help at Discharge: Family;Available 24 hours/day Type of Home: House Home Access: Ramped entrance     Home Layout: One level Home Equipment: Walker - 2 wheels;Wheelchair - manual;Grab bars - tub/shower;Bedside commode;Walker - 4 wheels      Prior Function Level of Independence: Independent with assistive device(s)         Comments: household ambulator using Rollator     Hand Dominance   Dominant Hand: Right    Extremity/Trunk Assessment   Upper Extremity Assessment Upper Extremity Assessment: Generalized weakness    Lower Extremity Assessment Lower Extremity Assessment: Generalized weakness    Cervical / Trunk Assessment Cervical / Trunk Assessment: Kyphotic  Communication   Communication: No difficulties  Cognition Arousal/Alertness: Awake/alert  Behavior During Therapy: WFL for tasks assessed/performed Overall Cognitive Status: Within Functional Limits for tasks assessed                                         General Comments      Exercises     Assessment/Plan    PT Assessment Patient needs continued PT services  PT Problem List Decreased strength;Decreased activity tolerance;Decreased balance;Decreased mobility       PT Treatment Interventions DME instruction;Gait training;Functional mobility training;Therapeutic activities;Therapeutic exercise;Stair training;Balance training;Patient/family education    PT Goals (Current goals can be found in the Care Plan section)  Acute Rehab PT Goals Patient Stated Goal: return home with assistance PT Goal Formulation: With patient Time For Goal Achievement: 04/07/21 Potential to Achieve Goals: Good    Frequency Min 3X/week   Barriers to discharge        Co-evaluation               AM-PAC PT "6 Clicks" Mobility  Outcome Measure Help needed turning from your back to your side while in a flat bed without using bedrails?: A Little Help needed moving from lying on your back to sitting on the side of a flat bed without using bedrails?: A Lot Help needed moving to and from a bed to a chair (including a wheelchair)?: A Lot Help needed standing up from a chair using your arms (e.g., wheelchair or bedside chair)?: A Lot Help needed to walk in hospital room?: A Lot Help needed climbing 3-5 steps with a railing? : Total 6 Click Score: 12    End of Session Equipment Utilized During Treatment: Oxygen Activity Tolerance: Patient tolerated treatment well;Patient limited by fatigue Patient left: in chair;with call bell/phone within reach Nurse Communication: Mobility status PT Visit Diagnosis: Unsteadiness on feet (R26.81);Other abnormalities of gait and mobility (R26.89);Muscle weakness (generalized) (M62.81)    Time: 7989-2119 PT Time Calculation (min) (ACUTE ONLY): 25 min   Charges:   PT Evaluation $PT Eval Moderate Complexity: 1 Mod PT Treatments $Therapeutic Activity: 23-37 mins        1:53 PM, 03/24/21 Ocie Bob, MPT Physical Therapist with [facility] [phone] office [phone] mobile phone

## 2021-03-25 LAB — COMPREHENSIVE METABOLIC PANEL
ALT: 45 U/L — ABNORMAL HIGH (ref 0–44)
AST: 47 U/L — ABNORMAL HIGH (ref 15–41)
Albumin: 3.2 g/dL — ABNORMAL LOW (ref 3.5–5.0)
Alkaline Phosphatase: 71 U/L (ref 38–126)
Anion gap: 9 (ref 5–15)
BUN: 18 mg/dL (ref 8–23)
CO2: 26 mmol/L (ref 22–32)
Calcium: 9.2 mg/dL (ref 8.9–10.3)
Chloride: 107 mmol/L (ref 98–111)
Creatinine, Ser: 1.14 mg/dL — ABNORMAL HIGH (ref 0.44–1.00)
GFR, Estimated: 50 mL/min — ABNORMAL LOW (ref >60)
Glucose, Bld: 82 mg/dL (ref 70–99)
Potassium: 3.4 mmol/L — ABNORMAL LOW (ref 3.5–5.1)
Sodium: 142 mmol/L (ref 135–145)
Total Bilirubin: 0.5 mg/dL (ref 0.3–1.2)
Total Protein: 5.9 g/dL — ABNORMAL LOW (ref 6.5–8.1)

## 2021-03-25 LAB — CBC WITH DIFFERENTIAL/PLATELET
Abs Immature Granulocytes: 0.07 10*3/uL (ref 0.00–0.07)
Basophils Absolute: 0 10*3/uL (ref 0.0–0.1)
Basophils Relative: 1 %
Eosinophils Absolute: 0.2 10*3/uL (ref 0.0–0.5)
Eosinophils Relative: 3 %
HCT: 45.5 % (ref 36.0–46.0)
Hemoglobin: 14.2 g/dL (ref 12.0–15.0)
Immature Granulocytes: 1 %
Lymphocytes Relative: 27 %
Lymphs Abs: 2 10*3/uL (ref 0.7–4.0)
MCH: 30.1 pg (ref 26.0–34.0)
MCHC: 31.2 g/dL (ref 30.0–36.0)
MCV: 96.4 fL (ref 80.0–100.0)
Monocytes Absolute: 0.5 10*3/uL (ref 0.1–1.0)
Monocytes Relative: 6 %
Neutro Abs: 4.9 10*3/uL (ref 1.7–7.7)
Neutrophils Relative %: 62 %
Platelets: 154 10*3/uL (ref 150–400)
RBC: 4.72 MIL/uL (ref 3.87–5.11)
RDW: 14.6 % (ref 11.5–15.5)
WBC: 7.6 10*3/uL (ref 4.0–10.5)
nRBC: 0 % (ref 0.0–0.2)

## 2021-03-25 LAB — URINE CULTURE: Culture: >=100000 — AB

## 2021-03-25 LAB — GLUCOSE, CAPILLARY
Glucose-Capillary: 85 mg/dL (ref 70–99)
Glucose-Capillary: 135 mg/dL — ABNORMAL HIGH (ref 70–99)

## 2021-03-25 LAB — MAGNESIUM: Magnesium: 2.5 mg/dL — ABNORMAL HIGH (ref 1.7–2.4)

## 2021-03-25 LAB — PHOSPHORUS: Phosphorus: 4.4 mg/dL (ref 2.5–4.6)

## 2021-03-25 NOTE — Discharge Summary (Signed)
Physician Discharge Summary  Amanda Armstrong ZGY:[id number] DOB: 1946-01-31 DOA: 03/22/2021  PCP: Redmond School, MD  Admit date: 03/22/2021 Discharge date: 03/25/2021  Admitted From: Home Disposition:  Home  Recommendations for Outpatient Follow-up:  Follow up with PCP in 1-2 weeks Please obtain BMP/CBC in one week your next doctors visit.  PO Amoxicillin to tx UTI 70/30 Humalin 12 U BID, outpatient Endocrine referral  Home Health: PT Equipment/Devices:Glucometer Discharge Condition: Stable CODE STATUS: DNR Diet recommendation: Diabetic  Brief/Interim Summary: 75 y.o. WF PMHx anxiety, chronic pain syndrome (on chronic opioid therapy,, frequent falls, vertigo, weakness, RIGHT foot drop, multifactorial gait disorder, daytime somnolence, HTN, hypothyroidism, DM type II uncontrolled with hyperglycemia.  Eventually he A1c resulted greater than 10.0.  Diabetic coordinator recommended 70/30 insulin.  Patient was also diagnosed with UTI which eventually grew Enterococcus faecalis therefore transition IV Rocephin to amoxicillin.  Due to issues with family education and their insulin prescription with pharmacy, discharge was held 9/7, but stable for dc today.        Assessment & Plan:   Active Problems:   Anxiety   Hypothyroidism   Lower urinary tract infectious disease   Hyperglycemia   Weakness   Tobacco abuse   Acute metabolic encephalopathy   Chronic pain syndrome   Recurrent falls   Vertigo   Multifactorial gait disorder   Right foot drop   Hyperosmolar hyperglycemic state (HHS) ([place])   Uncontrolled type 2 diabetes mellitus with hyperglycemia (HCC)   Insomnia     Acute metabolic encephalopathy - Resolved   DM type II uncontrolled with hyperglycemia - Her A1c is 10.8.  Seen by diabetic coordinator.  Patient will be discharged on Humulin 70/30 12 units twice daily, endocrine outpatient referral.  Education to be provided by the nursing staff   Lower UTI, Enterococcus  faecalis - Transition IV Rocephin to oral amoxicillin   Weakness/Recurrent falls - Suspect patient has had diabetes for some time unknown to patient.  Most likely has been having episodes of hyperglycemia/hypoglycemia.Marland Kitchen  PT/OT recommended SNF otherwise home with supervision.  Family opted to go home.   Right foot drop - When more stable will evaluate, appears to be chronic   Hypothyroidism -Synthroid 75 mcg daily   Tobacco abuse - NicoDerm patch - When patient more stable will counsel on need to discontinue smoking   Insomnia -Restart Xanax 1 mg qhs (home dose)      Body mass index is 34.23 kg/m.         Discharge Diagnoses:  Active Problems:   Anxiety   Hypothyroidism   Lower urinary tract infectious disease   Hyperglycemia   Weakness   Tobacco abuse   Acute metabolic encephalopathy   Chronic pain syndrome   Recurrent falls   Vertigo   Multifactorial gait disorder   Right foot drop   Hyperosmolar hyperglycemic state (HHS) ([place])   Uncontrolled type 2 diabetes mellitus with hyperglycemia ([place])   Insomnia      Consultations: Diabetic coordinator   Subjective: Feels ok no complaints.   Discharge Exam: Vitals:   03/25/21 0800 03/25/21 0823  BP: (!) 131/54   Pulse: 76   Resp: 18   Temp:    SpO2: 98% 97%   Vitals:   03/25/21 0600 03/25/21 0700 03/25/21 0800 03/25/21 0823  BP: (!) 119/57 (!) 111/59 (!) 131/54   Pulse: 68 65 76   Resp: (!) $RemoveB'21 17 18   'qlughPfu$ Temp:  97.6 F (36.4 C)    TempSrc:  Oral  SpO2: 97% 99% 98% 97%  Weight:      Height:        General: Pt is alert, awake, not in acute distress Cardiovascular: RRR, S1/S2 +, no rubs, no gallops Respiratory: CTA bilaterally, no wheezing, no rhonchi Abdominal: Soft, NT, ND, bowel sounds + Extremities: no edema, no cyanosis  Discharge Instructions  Discharge Instructions     Ambulatory referral to Endocrinology   Complete by: As directed    Uncontrolled DM2      Allergies as of  03/25/2021       Reactions   Iron Other (See Comments)   "upset stomach"   Oxycodone Other (See Comments)   "CX HER TO FELL LIKE SHE IS OUT OF HER BODY"   Sudafed [pseudoephedrine Hcl] Other (See Comments)   "funny feeling"        Medication List     STOP taking these medications    ciprofloxacin 500 MG tablet Commonly known as: CIPRO   famotidine 20 MG tablet Commonly known as: Pepcid   guaiFENesin-dextromethorphan 100-10 MG/5ML syrup Commonly known as: ROBITUSSIN DM   meclizine 25 MG tablet Commonly known as: ANTIVERT   polyethylene glycol 17 g packet Commonly known as: MIRALAX / GLYCOLAX   sulfamethoxazole-trimethoprim 400-80 MG tablet Commonly known as: BACTRIM   umeclidinium-vilanterol 62.5-25 MCG/INH Aepb Commonly known as: ANORO ELLIPTA   vitamin C 500 MG tablet Commonly known as: ASCORBIC ACID   zinc sulfate 220 (50 Zn) MG capsule       TAKE these medications    acetaminophen 325 MG tablet Commonly known as: Tylenol Take 2 tablets (650 mg total) by mouth every 6 (six) hours as needed for up to 30 doses for mild pain or moderate pain.   alendronate 70 MG tablet Commonly known as: FOSAMAX Take 70 mg by mouth once a week.   ALPRAZolam 1 MG tablet Commonly known as: XANAX Take 1 mg by mouth at bedtime.   amoxicillin 500 MG capsule Commonly known as: AMOXIL Take 1 capsule (500 mg total) by mouth 2 (two) times daily for 5 days.   aspirin 81 MG EC tablet Take 81 mg by mouth in the morning.   atorvastatin 20 MG tablet Commonly known as: LIPITOR Take 20 mg by mouth daily at 12 noon.   blood glucose meter kit and supplies Dispense based on patient and insurance preference. Use up to four times daily as directed. (FOR ICD-10 E10.9, E11.9).   clotrimazole-betamethasone cream Commonly known as: LOTRISONE Apply 1 application topically 2 (two) times daily.   furosemide 20 MG tablet Commonly known as: LASIX Take 20 mg by mouth daily.   HumuLIN  70/30 KwikPen (70-30) 100 UNIT/ML KwikPen Generic drug: insulin isophane & regular human KwikPen Inject 12 Units into the skin 2 (two) times daily.   levalbuterol 45 MCG/ACT inhaler Commonly known as: XOPENEX HFA Inhale 2 puffs into the lungs 2 (two) times daily.   levothyroxine 75 MCG tablet Commonly known as: SYNTHROID Take 1 tablet (75 mcg total) by mouth daily before breakfast.   nicotine 7 mg/24hr patch Commonly known as: NICODERM CQ - dosed in mg/24 hr Place 1 patch (7 mg total) onto the skin daily.   OXYGEN Inhale 1 L/min into the lungs daily.   Pen Needles 3/16" 31G X 5 MM Misc 100 each by Does not apply route 2 (two) times daily.   terbinafine 250 MG tablet Commonly known as: LAMISIL Take 1 tablet (250 mg total) by mouth every evening.  traZODone 50 MG tablet Commonly known as: DESYREL Take 1 tablet (50 mg total) by mouth at bedtime.   Vitamin D3 50 MCG (2000 UT) capsule Take 2,000 Units by mouth daily.        Follow-up Information     Redmond School, MD Follow up in 1 week(s).   Specialty: Internal Medicine Contact information: [address] [place] [address] [phone]                Allergies  Allergen Reactions   Iron Other (See Comments)    "upset stomach"   Oxycodone Other (See Comments)    "CX HER TO FELL LIKE SHE IS OUT OF HER BODY"   Sudafed [Pseudoephedrine Hcl] Other (See Comments)    "funny feeling"    You were cared for by a hospitalist during your hospital stay. If you have any questions about your discharge medications or the care you received while you were in the hospital after you are discharged, you can call the unit and asked to speak with the hospitalist on call if the hospitalist that took care of you is not available. Once you are discharged, your primary care physician will handle any further medical issues. Please note that no refills for any discharge medications will be authorized once you are  discharged, as it is imperative that you return to your primary care physician (or establish a relationship with a primary care physician if you do not have one) for your aftercare needs so that they can reassess your need for medications and monitor your lab values.   Procedures/Studies: No results found.   The results of significant diagnostics from this hospitalization (including imaging, microbiology, ancillary and laboratory) are listed below for reference.     Microbiology: Recent Results (from the past 240 hour(s))  Culture, blood (single)     Status: None (Preliminary result)   Collection Time: 03/22/21  9:07 AM   Specimen: Right Antecubital; Blood  Result Value Ref Range Status   Specimen Description   Final    RIGHT ANTECUBITAL BOTTLES DRAWN AEROBIC AND ANAEROBIC   Special Requests Blood Culture adequate volume  Final   Culture   Final    NO GROWTH 3 DAYS Performed at [facility], [address], [place], [place] [address]    Report Status PENDING  Incomplete  Resp Panel by RT-PCR (Flu A&B, Covid) Nasopharyngeal Swab     Status: None   Collection Time: 03/22/21  9:33 AM   Specimen: Nasopharyngeal Swab; Nasopharyngeal(NP) swabs in vial transport medium  Result Value Ref Range Status   SARS Coronavirus 2 by RT PCR NEGATIVE NEGATIVE Final    Comment: (NOTE) SARS-CoV-2 target nucleic acids are NOT DETECTED.  The SARS-CoV-2 RNA is generally detectable in upper respiratory specimens during the acute phase of infection. The lowest concentration of SARS-CoV-2 viral copies this assay can detect is 138 copies/mL. A negative result does not preclude SARS-Cov-2 infection and should not be used as the sole basis for treatment or other patient management decisions. A negative result may occur with  improper specimen collection/handling, submission of specimen other than nasopharyngeal swab, presence of viral mutation(s) within the areas targeted by this assay, and inadequate  number of viral copies(<138 copies/mL). A negative result must be combined with clinical observations, patient history, and epidemiological information. The expected result is Negative.  Fact Sheet for Patients:  EntrepreneurPulse.com.au  Fact Sheet for Healthcare Providers:  IncredibleEmployment.be  This test is no t yet approved or cleared by  the [employer] and  has been authorized for detection and/or diagnosis of SARS-CoV-2 by FDA under an Emergency Use Authorization (EUA). This EUA will remain  in effect (meaning this test can be used) for the duration of the COVID-19 declaration under Section 564(b)(1) of the Act, 21 U.S.C.section 360bbb-3(b)(1), unless the authorization is terminated  or revoked sooner.       Influenza A by PCR NEGATIVE NEGATIVE Final   Influenza B by PCR NEGATIVE NEGATIVE Final    Comment: (NOTE) The Xpert Xpress SARS-CoV-2/FLU/RSV plus assay is intended as an aid in the diagnosis of influenza from Nasopharyngeal swab specimens and should not be used as a sole basis for treatment. Nasal washings and aspirates are unacceptable for Xpert Xpress SARS-CoV-2/FLU/RSV testing.  Fact Sheet for Patients: EntrepreneurPulse.com.au  Fact Sheet for Healthcare Providers: IncredibleEmployment.be  This test is not yet approved or cleared by the [place] FDA and has been authorized for detection and/or diagnosis of SARS-CoV-2 by FDA under an Emergency Use Authorization (EUA). This EUA will remain in effect (meaning this test can be used) for the duration of the COVID-19 declaration under Section 564(b)(1) of the Act, 21 U.S.C. section 360bbb-3(b)(1), unless the authorization is terminated or revoked.  Performed at [facility], [address], [place], [place] [address]   Urine Culture     Status: Abnormal   Collection Time: 03/22/21 10:57 AM   Specimen: Urine, Clean Catch  Result  Value Ref Range Status   Specimen Description   Final    URINE, CLEAN CATCH Performed at [facility], [address] [place], [place] [address]    Special Requests   Final    NONE Performed at [facility], [address] [place], [place] [address]    Culture (A)  Final    >=100,000 COLONIES/mL ENTEROCOCCUS FAECALIS VANCOMYCIN RESISTANT ENTEROCOCCUS    Report Status 03/25/2021 FINAL  Final   Organism ID, Bacteria ENTEROCOCCUS FAECALIS (A)  Final      Susceptibility   Enterococcus faecalis - MIC*    AMPICILLIN <=2 SENSITIVE Sensitive     NITROFURANTOIN <=16 SENSITIVE Sensitive     VANCOMYCIN >=32 RESISTANT Resistant     LINEZOLID 2 SENSITIVE Sensitive     * >=100,000 COLONIES/mL ENTEROCOCCUS FAECALIS  Culture, blood (routine x 2)     Status: None (Preliminary result)   Collection Time: 03/22/21 12:04 PM   Specimen: BLOOD LEFT HAND  Result Value Ref Range Status   Specimen Description BLOOD LEFT HAND  Final   Special Requests   Final    BOTTLES DRAWN AEROBIC AND ANAEROBIC Blood Culture results may not be optimal due to an inadequate volume of blood received in culture bottles Performed at [facility], [address], [place], [place] [address]    Culture PENDING  Incomplete   Report Status PENDING  Incomplete  MRSA Next Gen by PCR, Nasal     Status: None   Collection Time: 03/22/21  1:40 PM   Specimen: Nasal Mucosa; Nasal Swab  Result Value Ref Range Status   MRSA by PCR Next Gen NOT DETECTED NOT DETECTED Final    Comment: (NOTE) The GeneXpert MRSA Assay (FDA approved for NASAL specimens only), is one component of a comprehensive MRSA colonization surveillance program. It is not intended to diagnose MRSA infection nor to guide or monitor treatment for MRSA infections. Test performance is not FDA approved in patients less than 58 years old. Performed at [facility], [address] [place], [place] [address]  Labs: BNP (last 3 results) No results for input(s): BNP  in the last 8760 hours. Basic Metabolic Panel: Recent Labs  Lab 03/22/21 2313 03/23/21 0259 03/23/21 0441 03/24/21 0451 03/25/21 0509  NA 150* 150* 149* 143 142  K 3.7 3.5 3.9 3.5 3.4*  CL 112* 113* 111 107 107  CO2 31 32 $Rem'29 29 26  'rXvZ$ GLUCOSE 210* 187* 192* 159* 82  BUN 24* $Remov'22 22 19 18  'wByySH$ CREATININE 1.53* 1.44* 1.41* 1.28* 1.14*  CALCIUM 9.7 9.5 9.6 8.8* 9.2  MG  --   --  2.3 2.4 2.5*  PHOS  --   --  2.1* 3.1 4.4   Liver Function Tests: Recent Labs  Lab 03/22/21 0907 03/23/21 0441 03/24/21 0451 03/25/21 0509  AST 44* 30 44* 47*  ALT 62* 42 44 45*  ALKPHOS 97 65 62 71  BILITOT 0.8 0.5 0.6 0.5  PROT 8.1 6.2* 5.8* 5.9*  ALBUMIN 4.4 3.3* 3.1* 3.2*   Recent Labs  Lab 03/22/21 0907  LIPASE 36   No results for input(s): AMMONIA in the last 168 hours. CBC: Recent Labs  Lab 03/22/21 0907 03/23/21 0441 03/24/21 0451 03/25/21 0509  WBC 8.7 15.9* 9.9 7.6  NEUTROABS 7.6 12.5* 6.8 4.9  HGB 15.3* 13.1 13.7 14.2  HCT 49.8* 40.0 43.6 45.5  MCV 99.8 93.9 98.2 96.4  PLT 228 210 171 154   Cardiac Enzymes: No results for input(s): CKTOTAL, CKMB, CKMBINDEX, TROPONINI in the last 168 hours. BNP: Invalid input(s): POCBNP CBG: Recent Labs  Lab 03/24/21 1704 03/24/21 1947 03/24/21 2321 03/25/21 0358 03/25/21 0751  GLUCAP 184* 275* 166* 85 135*   D-Dimer No results for input(s): DDIMER in the last 72 hours. Hgb A1c Recent Labs    03/23/21 0441  HGBA1C 10.8*   Lipid Profile Recent Labs    03/23/21 0441  CHOL 112  HDL 22*  LDLCALC 51  TRIG 197*  CHOLHDL 5.1   Thyroid function studies No results for input(s): TSH, T4TOTAL, T3FREE, THYROIDAB in the last 72 hours.  Invalid input(s): FREET3 Anemia work up No results for input(s): VITAMINB12, FOLATE, FERRITIN, TIBC, IRON, RETICCTPCT in the last 72 hours. Urinalysis    Component Value Date/Time   COLORURINE YELLOW 03/22/2021 1057   APPEARANCEUR CLEAR 03/22/2021 1057   LABSPEC <1.005 (L) 03/22/2021 1057    PHURINE 5.5 03/22/2021 1057   GLUCOSEU >=500 (A) 03/22/2021 1057   HGBUR TRACE (A) 03/22/2021 1057   BILIRUBINUR NEGATIVE 03/22/2021 1057   KETONESUR NEGATIVE 03/22/2021 1057   PROTEINUR NEGATIVE 03/22/2021 1057   UROBILINOGEN 0.2 08/27/2013 1130   NITRITE NEGATIVE 03/22/2021 1057   LEUKOCYTESUR NEGATIVE 03/22/2021 1057   Sepsis Labs Invalid input(s): PROCALCITONIN,  WBC,  LACTICIDVEN Microbiology Recent Results (from the past 240 hour(s))  Culture, blood (single)     Status: None (Preliminary result)   Collection Time: 03/22/21  9:07 AM   Specimen: Right Antecubital; Blood  Result Value Ref Range Status   Specimen Description   Final    RIGHT ANTECUBITAL BOTTLES DRAWN AEROBIC AND ANAEROBIC   Special Requests Blood Culture adequate volume  Final   Culture   Final    NO GROWTH 3 DAYS Performed at [facility], [address], [place], [place] [address]    Report Status PENDING  Incomplete  Resp Panel by RT-PCR (Flu A&B, Covid) Nasopharyngeal Swab     Status: None   Collection Time: 03/22/21  9:33 AM   Specimen: Nasopharyngeal Swab; Nasopharyngeal(NP) swabs in vial transport medium  Result  Value Ref Range Status   SARS Coronavirus 2 by RT PCR NEGATIVE NEGATIVE Final    Comment: (NOTE) SARS-CoV-2 target nucleic acids are NOT DETECTED.  The SARS-CoV-2 RNA is generally detectable in upper respiratory specimens during the acute phase of infection. The lowest concentration of SARS-CoV-2 viral copies this assay can detect is 138 copies/mL. A negative result does not preclude SARS-Cov-2 infection and should not be used as the sole basis for treatment or other patient management decisions. A negative result may occur with  improper specimen collection/handling, submission of specimen other than nasopharyngeal swab, presence of viral mutation(s) within the areas targeted by this assay, and inadequate number of viral copies(<138 copies/mL). A negative result must be combined  with clinical observations, patient history, and epidemiological information. The expected result is Negative.  Fact Sheet for Patients:  EntrepreneurPulse.com.au  Fact Sheet for Healthcare Providers:  IncredibleEmployment.be  This test is no t yet approved or cleared by the [place] FDA and  has been authorized for detection and/or diagnosis of SARS-CoV-2 by FDA under an Emergency Use Authorization (EUA). This EUA will remain  in effect (meaning this test can be used) for the duration of the COVID-19 declaration under Section 564(b)(1) of the Act, 21 U.S.C.section 360bbb-3(b)(1), unless the authorization is terminated  or revoked sooner.       Influenza A by PCR NEGATIVE NEGATIVE Final   Influenza B by PCR NEGATIVE NEGATIVE Final    Comment: (NOTE) The Xpert Xpress SARS-CoV-2/FLU/RSV plus assay is intended as an aid in the diagnosis of influenza from Nasopharyngeal swab specimens and should not be used as a sole basis for treatment. Nasal washings and aspirates are unacceptable for Xpert Xpress SARS-CoV-2/FLU/RSV testing.  Fact Sheet for Patients: EntrepreneurPulse.com.au  Fact Sheet for Healthcare Providers: IncredibleEmployment.be  This test is not yet approved or cleared by the [place] FDA and has been authorized for detection and/or diagnosis of SARS-CoV-2 by FDA under an Emergency Use Authorization (EUA). This EUA will remain in effect (meaning this test can be used) for the duration of the COVID-19 declaration under Section 564(b)(1) of the Act, 21 U.S.C. section 360bbb-3(b)(1), unless the authorization is terminated or revoked.  Performed at [facility], [address], [place], [place] [address]   Urine Culture     Status: Abnormal   Collection Time: 03/22/21 10:57 AM   Specimen: Urine, Clean Catch  Result Value Ref Range Status   Specimen Description   Final    URINE, CLEAN  CATCH Performed at [facility], [address] [place], [place] [address]    Special Requests   Final    NONE Performed at [facility], [address] [place], [place] [address]    Culture (A)  Final    >=100,000 COLONIES/mL ENTEROCOCCUS FAECALIS VANCOMYCIN RESISTANT ENTEROCOCCUS    Report Status 03/25/2021 FINAL  Final   Organism ID, Bacteria ENTEROCOCCUS FAECALIS (A)  Final      Susceptibility   Enterococcus faecalis - MIC*    AMPICILLIN <=2 SENSITIVE Sensitive     NITROFURANTOIN <=16 SENSITIVE Sensitive     VANCOMYCIN >=32 RESISTANT Resistant     LINEZOLID 2 SENSITIVE Sensitive     * >=100,000 COLONIES/mL ENTEROCOCCUS FAECALIS  Culture, blood (routine x 2)     Status: None (Preliminary result)   Collection Time: 03/22/21 12:04 PM   Specimen: BLOOD LEFT HAND  Result Value Ref Range Status   Specimen Description BLOOD LEFT HAND  Final   Special Requests   Final  BOTTLES DRAWN AEROBIC AND ANAEROBIC Blood Culture results may not be optimal due to an inadequate volume of blood received in culture bottles Performed at [facility], [address] [place], [place] [address]    Culture PENDING  Incomplete   Report Status PENDING  Incomplete  MRSA Next Gen by PCR, Nasal     Status: None   Collection Time: 03/22/21  1:40 PM   Specimen: Nasal Mucosa; Nasal Swab  Result Value Ref Range Status   MRSA by PCR Next Gen NOT DETECTED NOT DETECTED Final    Comment: (NOTE) The GeneXpert MRSA Assay (FDA approved for NASAL specimens only), is one component of a comprehensive MRSA colonization surveillance program. It is not intended to diagnose MRSA infection nor to guide or monitor treatment for MRSA infections. Test performance is not FDA approved in patients less than 17 years old. Performed at [facility] [place] [facility], [address], [place], [place] [address]      Time coordinating discharge:  I have spent 35 minutes face to face with the patient and on the ward discussing the patients  care, assessment, plan and disposition with other care givers. >50% of the time was devoted counseling the patient about the risks and benefits of treatment/Discharge disposition and coordinating care.   SIGNED:   Damita Lack, MD  [facility] 03/25/2021, 2:19 PM   If 7PM-7AM, please contact night-coverage

## 2021-03-25 NOTE — Progress Notes (Signed)
Nsg Discharge Note  Admit Date:  03/22/2021 Discharge date: 03/25/2021   Amanda Armstrong to be D/C'd Home per MD order.  AVS completed.  Copy for chart, and copy for patient signed, and dated. Patient/caregiver able to verbalize understanding.  Discharge Medication: Allergies as of 03/25/2021       Reactions   Iron Other (See Comments)   "upset stomach"   Oxycodone Other (See Comments)   "CX HER TO FELL LIKE SHE IS OUT OF HER BODY"   Sudafed [pseudoephedrine Hcl] Other (See Comments)   "funny feeling"        Medication List     STOP taking these medications    ciprofloxacin 500 MG tablet Commonly known as: CIPRO   famotidine 20 MG tablet Commonly known as: Pepcid   guaiFENesin-dextromethorphan 100-10 MG/5ML syrup Commonly known as: ROBITUSSIN DM   meclizine 25 MG tablet Commonly known as: ANTIVERT   polyethylene glycol 17 g packet Commonly known as: MIRALAX / GLYCOLAX   sulfamethoxazole-trimethoprim 400-80 MG tablet Commonly known as: BACTRIM   umeclidinium-vilanterol 62.5-25 MCG/INH Aepb Commonly known as: ANORO ELLIPTA   vitamin C 500 MG tablet Commonly known as: ASCORBIC ACID   zinc sulfate 220 (50 Zn) MG capsule       TAKE these medications    acetaminophen 325 MG tablet Commonly known as: Tylenol Take 2 tablets (650 mg total) by mouth every 6 (six) hours as needed for up to 30 doses for mild pain or moderate pain.   alendronate 70 MG tablet Commonly known as: FOSAMAX Take 70 mg by mouth once a week.   ALPRAZolam 1 MG tablet Commonly known as: XANAX Take 1 mg by mouth at bedtime.   amoxicillin 500 MG capsule Commonly known as: AMOXIL Take 1 capsule (500 mg total) by mouth 2 (two) times daily for 5 days.   aspirin 81 MG EC tablet Take 81 mg by mouth in the morning.   atorvastatin 20 MG tablet Commonly known as: LIPITOR Take 20 mg by mouth daily at 12 noon.   blood glucose meter kit and supplies Dispense based on patient and insurance  preference. Use up to four times daily as directed. (FOR ICD-10 E10.9, E11.9).   clotrimazole-betamethasone cream Commonly known as: LOTRISONE Apply 1 application topically 2 (two) times daily.   furosemide 20 MG tablet Commonly known as: LASIX Take 20 mg by mouth daily.   HumuLIN 70/30 KwikPen (70-30) 100 UNIT/ML KwikPen Generic drug: insulin isophane & regular human KwikPen Inject 12 Units into the skin 2 (two) times daily.   levalbuterol 45 MCG/ACT inhaler Commonly known as: XOPENEX HFA Inhale 2 puffs into the lungs 2 (two) times daily.   levothyroxine 75 MCG tablet Commonly known as: SYNTHROID Take 1 tablet (75 mcg total) by mouth daily before breakfast.   nicotine 7 mg/24hr patch Commonly known as: NICODERM CQ - dosed in mg/24 hr Place 1 patch (7 mg total) onto the skin daily.   OXYGEN Inhale 1 L/min into the lungs daily.   Pen Needles 3/16" 31G X 5 MM Misc 100 each by Does not apply route 2 (two) times daily.   terbinafine 250 MG tablet Commonly known as: LAMISIL Take 1 tablet (250 mg total) by mouth every evening.   traZODone 50 MG tablet Commonly known as: DESYREL Take 1 tablet (50 mg total) by mouth at bedtime.   Vitamin D3 50 MCG (2000 UT) capsule Take 2,000 Units by mouth daily.        Discharge Assessment:  Vitals:   03/25/21 0800 03/25/21 0823  BP: (!) 131/54   Pulse: 76   Resp: 18   Temp:    SpO2: 98% 97%   Skin clean, dry and intact without evidence of skin break down, no evidence of skin tears noted. IV catheter discontinued intact. Site without signs and symptoms of complications - no redness or edema noted at insertion site, patient denies c/o pain - only slight tenderness at site.  Dressing with slight pressure applied.  D/c Instructions-Education: Discharge instructions given to patient/family with verbalized understanding. D/c education completed with patient/family including follow up instructions, medication list, d/c activities  limitations if indicated, with other d/c instructions as indicated by MD - patient able to verbalize understanding, all questions fully answered. Patient instructed to return to ED, call 911, or call MD for any changes in condition.  Patient escorted via [place], and D/C home via private auto.  Carney Corners, RN 03/25/2021 10:51 AM

## 2021-03-25 NOTE — Plan of Care (Signed)

## 2021-03-25 NOTE — TOC Transition Note (Signed)
Transition of Care [facility]) - CM/SW Discharge Note   Patient Details  Name: Amanda Armstrong MRN: [id number] Date of Birth: 1945-09-13  Transition of Care [facility] CM/SW Contact:  Karn Cassis, LCSW Phone Number: 03/25/2021, 10:01 AM   Clinical Narrative:  Pt d/c today. Discussed with husband and daughter. They are aware home health has been notified of d/c and orders are in. Daughter to pick up pt today. RN updated.      Final next level of care: Home w Home Health Services Barriers to Discharge: Barriers Resolved   Patient Goals and CMS Choice Patient states their goals for this hospitalization and ongoing recovery are:: to go home. CMS Medicare.gov Compare Post Acute Care list provided to:: Patient Represenative (must comment) Choice offered to / list presented to : Adult Children  Discharge Placement              Patient chooses bed at:  (Home) Patient to be transferred to facility by: family Name of family member notified: Daughter Patient and family notified of of transfer: 03/25/21  Discharge Plan and Services                DME Arranged: N/A           HH Agency: [facility] Date [facility] Agency Contacted: 03/25/21 Time HH Agency Contacted: 1001 Representative spoke with at [facility] Agency: Misty Stanley  Social Determinants of Health (SDOH) Interventions     Readmission Risk Interventions Readmission Risk Prevention Plan 03/24/2021  Post Dischage Appt Complete  Medication Screening Complete  Transportation Screening Complete  Some recent data might be hidden

## 2021-03-25 NOTE — Progress Notes (Signed)
2140:  Educated pt at this time regarding administering her own insulin injections.  Showed pt how to administer with her return demonstration.  Pt needs reinforcement regarding self injections.  She does not pay attention to where she cleaned with alcohol and then she doesn't pay attention to if needle is in and remains in body part.  I then educated her on using insulin pen.  Pt was able to dial the pen but did not pay attention to the number of units that she was to give.  Reinforced this five different times.  I then had heard place needle on pen and inject into educational box for teaching.  I explained to her that she must push needle in and then push the top of pen until she hears a click and then to not remove needle for 5 seconds.  She did not follow instruction the first two times, she put needle in and then pulled right back out.  She was able to follow direction for the remaining 3x but with much reinforcement.  Will pass onto the next nurse to teach her again in the am.

## 2021-03-25 NOTE — Evaluation (Signed)
Occupational Therapy Evaluation Patient Details Name: Amanda Armstrong MRN: [id number] DOB: [birth year] Today's Date: 03/25/2021    History of Present Illness Amanda Armstrong is a 75 y.o. WF PMHx anxiety, chronic pain syndrome (on chronic opioid therapy,, frequent falls, vertigo, weakness, RIGHT foot drop, multifactorial gait disorder, daytime somnolence, HTN, hypothyroidism, DM type II uncontrolled with hyperglycemia,   Clinical Impression   Pt agreeable to OT evaluation. Pt donning 3L O2 during session. Pt noted to desaturate once at ~87% but quickly returned to above 90%. Pt required mod A for supine to sit bed mobility and mod to max A for sit to supine. Pt required max A to don socks while seated at EOB. Pt required mod A for stand pivot transfer due to difficulty mobilizing R LE. Pt demonstrates generalized B UE weakness. Pt reports not wanting to go to a facility to rehab prior to return home. Therapy needs can be met at the next venue of care. Pt will be discharged to care of nursing staff for the remaining length of stay.     Follow Up Recommendations  SNF;Other (comment) (assist for mobility.)    Equipment Recommendations  None recommended by OT           Precautions / Restrictions Precautions Precautions: Fall Restrictions Weight Bearing Restrictions: No      Mobility Bed Mobility Overal bed mobility: Needs Assistance Bed Mobility: Supine to Sit;Sit to Supine     Supine to sit: Mod assist Sit to supine: Mod assist;Max assist   General bed mobility comments: slow labored movment    Transfers Overall transfer level: Needs assistance Equipment used: Rolling walker (2 wheeled) Transfers: Sit to/from [facility] Sit to Stand: Min assist Stand pivot transfers: Mod assist       General transfer comment: slow labored movment; difficulty moving R LE during transfer.    Balance Overall balance assessment: Needs assistance Sitting-balance support:  Feet supported;No upper extremity supported Sitting balance-Leahy Scale: Fair (fair to good) Sitting balance - Comments: seated at EOB   Standing balance support: During functional activity;Bilateral upper extremity supported Standing balance-Leahy Scale: Poor Standing balance comment: fair/poor using RW                           ADL either performed or assessed with clinical judgement   ADL Overall ADL's : Needs assistance/impaired                     Lower Body Dressing: Maximal assistance;Sitting/lateral leans Lower Body Dressing Details (indicate cue type and reason): donning and doffing socks sitting at EOB Toilet Transfer: Moderate assistance;RW;Stand-pivot Toilet Transfer Details (indicate cue type and reason): simulated via EOB to chair and back                 Vision Baseline Vision/History: 1 Wears glasses (reading) Ability to See in Adequate Light: 0 Adequate Patient Visual Report: No change from baseline                  Pertinent Vitals/Pain Pain Assessment: No/denies pain     Hand Dominance Right   Extremity/Trunk Assessment Upper Extremity Assessment Upper Extremity Assessment: Generalized weakness   Lower Extremity Assessment Lower Extremity Assessment: Defer to PT evaluation   Cervical / Trunk Assessment Cervical / Trunk Assessment: Kyphotic   Communication Communication Communication: No difficulties   Cognition Arousal/Alertness: Awake/alert Behavior During Therapy: WFL for tasks assessed/performed Overall Cognitive Status: Within Functional  Limits for tasks assessed                                                Home Living Family/patient expects to be discharged to:: Private residence Living Arrangements: Spouse/significant other Available Help at Discharge: Family;Available 24 hours/day Type of Home: House Home Access: Ramped entrance     Home Layout: One level     Bathroom Shower/Tub:  Teacher, early years/pre: Handicapped height Bathroom Accessibility: Yes   Home Equipment: Environmental consultant - 2 wheels;Wheelchair - manual;Grab bars - tub/shower;Bedside commode;Walker - 4 wheels          Prior Functioning/Environment Level of Independence: Independent with assistive device(s)        Comments: household ambulator using Rollator                 OT Goals(Current goals can be found in the care plan section) Acute Rehab OT Goals Patient Stated Goal: return home with assistance                                                  End of Session Equipment Utilized During Treatment: Rolling walker;Oxygen  Activity Tolerance: Patient tolerated treatment well Patient left: in bed;with call bell/phone within reach  OT Visit Diagnosis: Unsteadiness on feet (R26.81);Muscle weakness (generalized) (M62.81)                Time: 6415-8309 OT Time Calculation (min): 27 min Charges:  OT General Charges $OT Visit: 1 Visit OT Evaluation $OT Eval Low Complexity: 1 Low  Tonyetta Berko OT, MOT  Larey Seat 03/25/2021, 9:44 AM

## 2021-03-27 LAB — CULTURE, BLOOD (ROUTINE X 2): Culture: NO GROWTH

## 2021-03-27 LAB — CULTURE, BLOOD (SINGLE)
Culture: NO GROWTH
Special Requests: ADEQUATE

## 2021-03-29 DIAGNOSIS — R2681 Unsteadiness on feet: Secondary | ICD-10-CM | POA: Diagnosis not present

## 2021-03-29 DIAGNOSIS — J449 Chronic obstructive pulmonary disease, unspecified: Secondary | ICD-10-CM | POA: Diagnosis not present

## 2021-03-29 DIAGNOSIS — M810 Age-related osteoporosis without current pathological fracture: Secondary | ICD-10-CM | POA: Diagnosis not present

## 2021-03-29 DIAGNOSIS — R5383 Other fatigue: Secondary | ICD-10-CM | POA: Diagnosis not present

## 2021-03-29 DIAGNOSIS — U099 Post covid-19 condition, unspecified: Secondary | ICD-10-CM | POA: Diagnosis not present

## 2021-03-29 DIAGNOSIS — M159 Polyosteoarthritis, unspecified: Secondary | ICD-10-CM | POA: Diagnosis not present

## 2021-03-29 DIAGNOSIS — F17210 Nicotine dependence, cigarettes, uncomplicated: Secondary | ICD-10-CM | POA: Diagnosis not present

## 2021-03-29 DIAGNOSIS — N183 Chronic kidney disease, stage 3 unspecified: Secondary | ICD-10-CM | POA: Diagnosis not present

## 2021-03-29 DIAGNOSIS — E063 Autoimmune thyroiditis: Secondary | ICD-10-CM | POA: Diagnosis not present

## 2021-03-30 ENCOUNTER — Institutional Professional Consult (permissible substitution): Payer: Medicare Other | Admitting: Pulmonary Disease

## 2021-03-30 DIAGNOSIS — G9341 Metabolic encephalopathy: Secondary | ICD-10-CM | POA: Diagnosis not present

## 2021-03-30 DIAGNOSIS — E063 Autoimmune thyroiditis: Secondary | ICD-10-CM | POA: Diagnosis not present

## 2021-03-30 DIAGNOSIS — M159 Polyosteoarthritis, unspecified: Secondary | ICD-10-CM | POA: Diagnosis not present

## 2021-03-30 DIAGNOSIS — J449 Chronic obstructive pulmonary disease, unspecified: Secondary | ICD-10-CM | POA: Diagnosis not present

## 2021-03-30 DIAGNOSIS — I679 Cerebrovascular disease, unspecified: Secondary | ICD-10-CM | POA: Diagnosis not present

## 2021-03-30 DIAGNOSIS — Z681 Body mass index (BMI) 19 or less, adult: Secondary | ICD-10-CM | POA: Diagnosis not present

## 2021-03-30 DIAGNOSIS — E782 Mixed hyperlipidemia: Secondary | ICD-10-CM | POA: Diagnosis not present

## 2021-03-30 DIAGNOSIS — I700 Atherosclerosis of aorta: Secondary | ICD-10-CM | POA: Diagnosis not present

## 2021-03-30 DIAGNOSIS — G894 Chronic pain syndrome: Secondary | ICD-10-CM | POA: Diagnosis not present

## 2021-03-30 DIAGNOSIS — N179 Acute kidney failure, unspecified: Secondary | ICD-10-CM | POA: Diagnosis not present

## 2021-03-30 DIAGNOSIS — R739 Hyperglycemia, unspecified: Secondary | ICD-10-CM | POA: Diagnosis not present

## 2021-03-30 DIAGNOSIS — E876 Hypokalemia: Secondary | ICD-10-CM | POA: Diagnosis not present

## 2021-03-30 DIAGNOSIS — G319 Degenerative disease of nervous system, unspecified: Secondary | ICD-10-CM | POA: Diagnosis not present

## 2021-03-30 LAB — COMPREHENSIVE METABOLIC PANEL: Calcium: 9.4 (ref 8.7–10.7)

## 2021-03-30 LAB — BASIC METABOLIC PANEL
BUN: 11 (ref 4–21)
CO2: 23 — AB (ref 13–22)
Chloride: 101 (ref 99–108)
Creatinine: 1.2 — AB (ref 0.5–1.1)
Glucose: 190
Potassium: 4.2 (ref 3.4–5.3)
Sodium: 140 (ref 137–147)

## 2021-03-31 DIAGNOSIS — J449 Chronic obstructive pulmonary disease, unspecified: Secondary | ICD-10-CM | POA: Diagnosis not present

## 2021-03-31 DIAGNOSIS — U071 COVID-19: Secondary | ICD-10-CM | POA: Diagnosis not present

## 2021-03-31 DIAGNOSIS — J9601 Acute respiratory failure with hypoxia: Secondary | ICD-10-CM | POA: Diagnosis not present

## 2021-03-31 LAB — ECHOCARDIOGRAM COMPLETE
AR max vel: 2.19 cm2
AV Area VTI: 2.17 cm2
AV Area mean vel: 2.07 cm2
AV Mean grad: 2 mmHg
AV Peak grad: 4.3 mmHg
Ao pk vel: 1.04 m/s
Area-P 1/2: 3.89 cm2
Calc EF: 56.7 %
Height: 58 in
S' Lateral: 2.16 cm
Single Plane A2C EF: 60.6 %
Single Plane A4C EF: 57 %
Weight: 1967.91 oz

## 2021-04-01 DIAGNOSIS — M159 Polyosteoarthritis, unspecified: Secondary | ICD-10-CM | POA: Diagnosis not present

## 2021-04-01 DIAGNOSIS — M810 Age-related osteoporosis without current pathological fracture: Secondary | ICD-10-CM | POA: Diagnosis not present

## 2021-04-01 DIAGNOSIS — J9601 Acute respiratory failure with hypoxia: Secondary | ICD-10-CM | POA: Diagnosis not present

## 2021-04-01 DIAGNOSIS — R5383 Other fatigue: Secondary | ICD-10-CM | POA: Diagnosis not present

## 2021-04-01 DIAGNOSIS — E063 Autoimmune thyroiditis: Secondary | ICD-10-CM | POA: Diagnosis not present

## 2021-04-01 DIAGNOSIS — R2681 Unsteadiness on feet: Secondary | ICD-10-CM | POA: Diagnosis not present

## 2021-04-01 DIAGNOSIS — U071 COVID-19: Secondary | ICD-10-CM | POA: Diagnosis not present

## 2021-04-01 DIAGNOSIS — N183 Chronic kidney disease, stage 3 unspecified: Secondary | ICD-10-CM | POA: Diagnosis not present

## 2021-04-01 DIAGNOSIS — U099 Post covid-19 condition, unspecified: Secondary | ICD-10-CM | POA: Diagnosis not present

## 2021-04-01 DIAGNOSIS — J449 Chronic obstructive pulmonary disease, unspecified: Secondary | ICD-10-CM | POA: Diagnosis not present

## 2021-04-01 DIAGNOSIS — F17210 Nicotine dependence, cigarettes, uncomplicated: Secondary | ICD-10-CM | POA: Diagnosis not present

## 2021-04-02 ENCOUNTER — Other Ambulatory Visit: Payer: Self-pay

## 2021-04-02 ENCOUNTER — Ambulatory Visit (INDEPENDENT_AMBULATORY_CARE_PROVIDER_SITE_OTHER): Payer: Medicare Other | Admitting: Nurse Practitioner

## 2021-04-02 ENCOUNTER — Encounter: Payer: Self-pay | Admitting: Nurse Practitioner

## 2021-04-02 VITALS — BP 105/65 | HR 71

## 2021-04-02 DIAGNOSIS — E038 Other specified hypothyroidism: Secondary | ICD-10-CM | POA: Diagnosis not present

## 2021-04-02 DIAGNOSIS — E1165 Type 2 diabetes mellitus with hyperglycemia: Secondary | ICD-10-CM | POA: Diagnosis not present

## 2021-04-02 DIAGNOSIS — E782 Mixed hyperlipidemia: Secondary | ICD-10-CM | POA: Diagnosis not present

## 2021-04-02 MED ORDER — HUMULIN 70/30 KWIKPEN (70-30) 100 UNIT/ML ~~LOC~~ SUPN: 6.0000 [IU] | PEN_INJECTOR | Freq: Two times a day (BID) | SUBCUTANEOUS | 0 refills | Status: DC

## 2021-04-02 NOTE — Patient Instructions (Signed)
Diabetes Mellitus and Nutrition, Adult When you have diabetes, or diabetes mellitus, it is very important to have healthy eating habits because your blood sugar (glucose) levels are greatly affected by what you eat and drink. Eating healthy foods in the right amounts, at about the same times every day, can help you:  Control your blood glucose.  Lower your risk of heart disease.  Improve your blood pressure.  Reach or maintain a healthy weight. What can affect my meal plan? Every person with diabetes is different, and each person has different needs for a meal plan. Your health care provider may recommend that you work with a dietitian to make a meal plan that is best for you. Your meal plan may vary depending on factors such as:  The calories you need.  The medicines you take.  Your weight.  Your blood glucose, blood pressure, and cholesterol levels.  Your activity level.  Other health conditions you have, such as heart or kidney disease. How do carbohydrates affect me? Carbohydrates, also called carbs, affect your blood glucose level more than any other type of food. Eating carbs naturally raises the amount of glucose in your blood. Carb counting is a method for keeping track of how many carbs you eat. Counting carbs is important to keep your blood glucose at a healthy level, especially if you use insulin or take certain oral diabetes medicines. It is important to know how many carbs you can safely have in each meal. This is different for every person. Your dietitian can help you calculate how many carbs you should have at each meal and for each snack. How does alcohol affect me? Alcohol can cause a sudden decrease in blood glucose (hypoglycemia), especially if you use insulin or take certain oral diabetes medicines. Hypoglycemia can be a life-threatening condition. Symptoms of hypoglycemia, such as sleepiness, dizziness, and confusion, are similar to symptoms of having too much  alcohol.  Do not drink alcohol if: ? Your health care provider tells you not to drink. ? You are pregnant, may be pregnant, or are planning to become pregnant.  If you drink alcohol: ? Do not drink on an empty stomach. ? Limit how much you use to:  0-1 drink a day for women.  0-2 drinks a day for men. ? Be aware of how much alcohol is in your drink. In the [place] one drink equals one 12 oz bottle of beer (355 mL), one 5 oz glass of wine (148 mL), or one 1 oz glass of hard liquor (44 mL). ? Keep yourself hydrated with water, diet soda, or unsweetened iced tea.  Keep in mind that regular soda, juice, and other mixers may contain a lot of sugar and must be counted as carbs. What are tips for following this plan? Reading food labels  Start by checking the serving size on the "Nutrition Facts" label of packaged foods and drinks. The amount of calories, carbs, fats, and other nutrients listed on the label is based on one serving of the item. Many items contain more than one serving per package.  Check the total grams (g) of carbs in one serving. You can calculate the number of servings of carbs in one serving by dividing the total carbs by 15. For example, if a food has 30 g of total carbs per serving, it would be equal to 2 servings of carbs.  Check the number of grams (g) of saturated fats and trans fats in one serving. Choose foods that have   a low amount or none of these fats.  Check the number of milligrams (mg) of salt (sodium) in one serving. Most people should limit total sodium intake to less than 2,300 mg per day.  Always check the nutrition information of foods labeled as "low-fat" or "nonfat." These foods may be higher in added sugar or refined carbs and should be avoided.  Talk to your dietitian to identify your daily goals for nutrients listed on the label. Shopping  Avoid buying canned, pre-made, or processed foods. These foods tend to be high in fat, sodium, and added  sugar.  Shop around the outside edge of the grocery store. This is where you will most often find fresh fruits and vegetables, bulk grains, fresh meats, and fresh dairy. Cooking  Use low-heat cooking methods, such as baking, instead of high-heat cooking methods like deep frying.  Cook using healthy oils, such as olive, canola, or sunflower oil.  Avoid cooking with butter, cream, or high-fat meats. Meal planning  Eat meals and snacks regularly, preferably at the same times every day. Avoid going long periods of time without eating.  Eat foods that are high in fiber, such as fresh fruits, vegetables, beans, and whole grains. Talk with your dietitian about how many servings of carbs you can eat at each meal.  Eat 4-6 oz (112-168 g) of lean protein each day, such as lean meat, chicken, fish, eggs, or tofu. One ounce (oz) of lean protein is equal to: ? 1 oz (28 g) of meat, chicken, or fish. ? 1 egg. ?  cup (62 g) of tofu.  Eat some foods each day that contain healthy fats, such as avocado, nuts, seeds, and fish.   What foods should I eat? Fruits Berries. Apples. Oranges. Peaches. Apricots. Plums. Grapes. Mango. Papaya. Pomegranate. Kiwi. Cherries. Vegetables Lettuce. Spinach. Leafy greens, including kale, chard, collard greens, and mustard greens. Beets. Cauliflower. Cabbage. Broccoli. Carrots. Green beans. Tomatoes. Peppers. Onions. Cucumbers. Brussels sprouts. Grains Whole grains, such as whole-wheat or whole-grain bread, crackers, tortillas, cereal, and pasta. Unsweetened oatmeal. Quinoa. Brown or wild rice. Meats and other proteins Seafood. Poultry without skin. Lean cuts of poultry and beef. Tofu. Nuts. Seeds. Dairy Low-fat or fat-free dairy products such as milk, yogurt, and cheese. The items listed above may not be a complete list of foods and beverages you can eat. Contact a dietitian for more information. What foods should I avoid? Fruits Fruits canned with  syrup. Vegetables Canned vegetables. Frozen vegetables with butter or cream sauce. Grains Refined white flour and flour products such as bread, pasta, snack foods, and cereals. Avoid all processed foods. Meats and other proteins Fatty cuts of meat. Poultry with skin. Breaded or fried meats. Processed meat. Avoid saturated fats. Dairy Full-fat yogurt, cheese, or milk. Beverages Sweetened drinks, such as soda or iced tea. The items listed above may not be a complete list of foods and beverages you should avoid. Contact a dietitian for more information. Questions to ask a health care provider  Do I need to meet with a diabetes educator?  Do I need to meet with a dietitian?  What number can I call if I have questions?  When are the best times to check my blood glucose? Where to find more information:  American Diabetes Association: diabetes.org  Academy of Nutrition and Dietetics: www.eatright.org  National Institute of Diabetes and Digestive and Kidney Diseases: www.niddk.nih.gov  Association of Diabetes Care and Education Specialists: www.diabeteseducator.org Summary  It is important to have healthy eating   habits because your blood sugar (glucose) levels are greatly affected by what you eat and drink.  A healthy meal plan will help you control your blood glucose and maintain a healthy lifestyle.  Your health care provider may recommend that you work with a dietitian to make a meal plan that is best for you.  Keep in mind that carbohydrates (carbs) and alcohol have immediate effects on your blood glucose levels. It is important to count carbs and to use alcohol carefully. This information is not intended to replace advice given to you by your health care provider. Make sure you discuss any questions you have with your health care provider. Document Revised: 06/11/2019 Document Reviewed: 06/11/2019 Elsevier Patient Education  2021 [employer]  

## 2021-04-02 NOTE — Progress Notes (Signed)
Endocrinology Consult Note       04/02/2021, 11:40 AM   Subjective:    Patient ID: Amanda Armstrong, female    DOB: Mar 07, 1946.  Amanda Armstrong is being seen in consultation for management of currently uncontrolled symptomatic diabetes requested by  Redmond School, MD.   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    pt stated that she has intermittent sx   Clostridium difficile infection    Daytime somnolence    Frequent falls    Headache(784.0)    SINUS HEADACHE OCCASSIONALLY   Hypertension    Hypothyroidism    Multifactorial gait disorder    Osteoporosis    Right foot drop    Snoring    Stroke ([place])    Tobacco use    Vertigo    Weakness     Past Surgical History:  Procedure Laterality Date   CATARACT EXTRACTION W/PHACO Right 06/28/2016   Procedure: CATARACT EXTRACTION PHACO AND INTRAOCULAR LENS PLACEMENT ([place]);  Surgeon: Rutherford Guys, MD;  Location: [facility];  Service: Ophthalmology;  Laterality: Right;  CDE: 11.10   CATARACT EXTRACTION W/PHACO Left 07/26/2016   Procedure: CATARACT EXTRACTION PHACO AND INTRAOCULAR LENS PLACEMENT ([facility]);  Surgeon: Rutherford Guys, MD;  Location: [facility];  Service: Ophthalmology;  Laterality: Left;  CDE: 8.88   JOINT REPLACEMENT  11/08/3010   right total knee   TOTAL KNEE ARTHROPLASTY  11/07/2011   Procedure: TOTAL KNEE ARTHROPLASTY;  Surgeon: Lorn Junes, MD;  Location: [facility];  Service: Orthopedics;  Laterality: Left;  DR Ephriam Knuckles 45 MINUTES FOR THIS CASE   TOTAL KNEE ARTHROPLASTY Right 2011   TUBAL LIGATION  1982    Social History   Socioeconomic History   Marital status: Married    Spouse name: Jeneen Rinks   Number of children: 4   Years of education: [employer]   Highest education level: Not on file  Occupational History   Not on file   Tobacco Use   Smoking status: Every Day    Packs/day: 1.00    Years: 48.00    Pack years: 48.00    Types: Cigarettes    Last attempt to quit: 06/15/2012    Years since quitting: 8.8   Smokeless tobacco: Never   Tobacco comments:    12/29/14 1/2 PPD  Vaping Use   Vaping Use: Never used  Substance and Sexual Activity   Alcohol use: No    Alcohol/week: 0.0 standard drinks   Drug use: No   Sexual activity: Never    Birth control/protection: Surgical  Other Topics Concern   Not on file  Social History Narrative   1 cup of coffee a day    Social Determinants of Health   Financial Resource Strain: Not on file  Food Insecurity: Not on file  Transportation Needs: Not on file  Physical Activity: Not on file  Stress: Not on file  Social Connections: Not on file    Family History  Problem Relation Age of Onset   Heart attack Mother  COPD Father    Arthritis Sister    Heart disease Brother     Outpatient Encounter Medications as of 04/02/2021  Medication Sig   ACCU-CHEK GUIDE test strip USE 1 STRIP TO CHECK GLUCOSE UP TO 4 TIMES DAILY AS DIRECTED   Accu-Chek Softclix Lancets lancets SMARTSIG:1 Topical 4 Times Daily   alendronate (FOSAMAX) 70 MG tablet Take 70 mg by mouth once a week.   ALPRAZolam (XANAX) 1 MG tablet Take 1 mg by mouth at bedtime.   aspirin 81 MG EC tablet Take 81 mg by mouth in the morning.   atorvastatin (LIPITOR) 20 MG tablet Take 20 mg by mouth daily at 12 noon.   blood glucose meter kit and supplies Dispense based on patient and insurance preference. Use up to four times daily as directed. (FOR ICD-10 E10.9, E11.9).   Cholecalciferol (VITAMIN D3) 50 MCG (2000 UT) capsule Take 2,000 Units by mouth daily.   furosemide (LASIX) 20 MG tablet Take 20 mg by mouth daily.   HYDROcodone-acetaminophen (NORCO) 10-325 MG tablet Take 1 tablet by mouth every 4 (four) hours as needed.   Insulin Pen Needle (PEN NEEDLES 3/16") 31G X 5 MM MISC 100 each by Does not apply  route 2 (two) times daily.   levalbuterol (XOPENEX HFA) 45 MCG/ACT inhaler Inhale 2 puffs into the lungs 2 (two) times daily.   OXYGEN Inhale 1 L/min into the lungs daily.   RELION PEN NEEDLES 31G X 6 MM MISC USE 1 PEN NEEDLE TWICE DAILY   triamcinolone cream (KENALOG) 0.1 % SMARTSIG:1 Application Topical 2-3 Times Daily   [DISCONTINUED] insulin isophane & regular human KwikPen (HUMULIN 70/30 KWIKPEN) (70-30) 100 UNIT/ML KwikPen Inject 12 Units into the skin 2 (two) times daily. (Patient taking differently: Inject 12 Units into the skin daily. In the morning)   insulin isophane & regular human KwikPen (HUMULIN 70/30 KWIKPEN) (70-30) 100 UNIT/ML KwikPen Inject 6 Units into the skin 2 (two) times daily.   levothyroxine (SYNTHROID) 75 MCG tablet Take 1 tablet (75 mcg total) by mouth daily before breakfast. (Patient not taking: Reported on 04/02/2021)   [DISCONTINUED] acetaminophen (TYLENOL) 325 MG tablet Take 2 tablets (650 mg total) by mouth every 6 (six) hours as needed for up to 30 doses for mild pain or moderate pain. (Patient not taking: Reported on 04/02/2021)   [DISCONTINUED] clotrimazole-betamethasone (LOTRISONE) cream Apply 1 application topically 2 (two) times daily. (Patient not taking: Reported on 04/02/2021)   [DISCONTINUED] nicotine (NICODERM CQ - DOSED IN MG/24 HR) 7 mg/24hr patch Place 1 patch (7 mg total) onto the skin daily. (Patient not taking: No sig reported)   [DISCONTINUED] terbinafine (LAMISIL) 250 MG tablet Take 1 tablet (250 mg total) by mouth every evening. (Patient not taking: No sig reported)   [DISCONTINUED] traZODone (DESYREL) 50 MG tablet Take 1 tablet (50 mg total) by mouth at bedtime. (Patient not taking: No sig reported)   No facility-administered encounter medications on file as of 04/02/2021.    ALLERGIES: Allergies  Allergen Reactions   Iron Other (See Comments)    "upset stomach"   Oxycodone Other (See Comments)    "CX HER TO FELL LIKE SHE IS OUT OF HER BODY"    Sudafed [Pseudoephedrine Hcl] Other (See Comments)    "funny feeling"    VACCINATION STATUS:  There is no immunization history on file for this patient.  Diabetes She presents for her initial diabetic visit. She has type 2 diabetes mellitus. Her disease course has been worsening. There are  no hypoglycemic associated symptoms. Associated symptoms include fatigue. There are no hypoglycemic complications. Diabetic complications include nephropathy. Risk factors for coronary artery disease include diabetes mellitus, dyslipidemia, obesity, sedentary lifestyle, family history, tobacco exposure and post-menopausal. Current diabetic treatment includes insulin injections. She is compliant with treatment most of the time. Her weight is fluctuating minimally. She is following a generally unhealthy diet. When asked about meal planning, she reported none. She has not had a previous visit with a dietitian. She never (unable to engage in routine physical activity due to mobility deficits) participates in exercise. (She presents today for her consultation, accompanied by her family and hired caregiver, with glucose readings showing fluctuating glycemic profile.  Her most recent A1c was 10.8% on 9/6 during a recent hospitalization.  Says she was just then diagnosed with diabetes.  She was started on premixed insulin twice daily but has only been taking it once daily due to glucose readings being on the lower side.  She monitors her glucose 4 times daily.  She eats 3 meals per day, no snacks.  She does admit to drinking sugary beverages such as Diet Mt Dew and water with crystal light.  She does not engage in routine physical activity due to decreased mobility since her hospitalization.  She denies any hypoglycemia.) An ACE inhibitor/angiotensin II receptor blocker is not being taken. She does not see a podiatrist.Eye exam is not current.  Hyperlipidemia This is a chronic problem. The current episode started more than 1  year ago. The problem is uncontrolled. Recent lipid tests were reviewed and are variable. Exacerbating diseases include chronic renal disease, diabetes, hypothyroidism and obesity. Factors aggravating her hyperlipidemia include smoking and fatty foods. Current antihyperlipidemic treatment includes statins. The current treatment provides mild improvement of lipids. Compliance problems include adherence to diet and adherence to exercise.  Risk factors for coronary artery disease include diabetes mellitus, dyslipidemia, family history, obesity, a sedentary lifestyle and post-menopausal.  Thyroid Problem Presents for initial visit. Symptoms include cold intolerance, depressed mood and fatigue. The symptoms have been worsening. Past treatments include levothyroxine (her thyroid hormone was stopped for unknown reasons). Her past medical history is significant for diabetes, hyperlipidemia and obesity.    Review of systems  Constitutional: + Minimally fluctuating body weight, current There is no height or weight on file to calculate BMI., + fatigue, no subjective hyperthermia, no subjective hypothermia Eyes: no blurry vision, no xerophthalmia ENT: no sore throat, no nodules palpated in throat, no dysphagia/odynophagia, no hoarseness Cardiovascular: no chest pain, no shortness of breath, no palpitations, no leg swelling Respiratory: no cough, COPD on supplemental oxygen (not currently on during the visit) Gastrointestinal: no nausea/vomiting/diarrhea Musculoskeletal: hx of chronic pain, recently hospitalized for encephalopathy- currently in wheelchair Skin: no rashes, no hyperemia Neurological: no tremors, no numbness, no tingling, no dizziness Psychiatric: no depression, no anxiety  Objective:     BP 105/65   Pulse 71   Wt Readings from Last 3 Encounters:  03/25/21 163 lb 12.8 oz (74.3 kg)  08/31/20 151 lb 9.6 oz (68.8 kg)  08/17/20 158 lb 6.4 oz (71.8 kg)     BP Readings from Last 3 Encounters:   04/02/21 105/65  03/25/21 (!) 131/54  08/31/20 118/64     Physical Exam- Limited  Constitutional:  There is no height or weight on file to calculate BMI. , not in acute distress, normal state of mind, drowsy during appt Eyes:  EOMI, no exophthalmos Neck: Supple Cardiovascular: RRR, no murmurs, rubs, or gallops, 1+ pitting  edema to BLE Respiratory: Adequate breathing efforts, no crackles, rales, rhonchi, or wheezing Musculoskeletal: no gross deformities, in wheelchair Skin:  no rashes, no hyperemia Neurological: no tremor with outstretched hands    CMP ( most recent) CMP     Component Value Date/Time   NA 142 03/25/2021 0509   NA 138 08/13/2020 0000   K 3.4 (L) 03/25/2021 0509   CL 107 03/25/2021 0509   CO2 26 03/25/2021 0509   GLUCOSE 82 03/25/2021 0509   BUN 18 03/25/2021 0509   BUN 20 08/13/2020 0000   CREATININE 1.14 (H) 03/25/2021 0509   CALCIUM 9.2 03/25/2021 0509   PROT 5.9 (L) 03/25/2021 0509   ALBUMIN 3.2 (L) 03/25/2021 0509   AST 47 (H) 03/25/2021 0509   ALT 45 (H) 03/25/2021 0509   ALKPHOS 71 03/25/2021 0509   BILITOT 0.5 03/25/2021 0509   GFRNONAA 50 (L) 03/25/2021 0509   GFRAA 59.61 08/13/2020 0000     Diabetic Labs (most recent): Lab Results  Component Value Date   HGBA1C 10.8 (H) 03/23/2021   HGBA1C 10.9 (H) 03/22/2021   HGBA1C 6.7 (H) 08/03/2020     Lipid Panel ( most recent) Lipid Panel     Component Value Date/Time   CHOL 112 03/23/2021 0441   TRIG 197 (H) 03/23/2021 0441   HDL 22 (L) 03/23/2021 0441   CHOLHDL 5.1 03/23/2021 0441   VLDL 39 03/23/2021 0441   LDLCALC 51 03/23/2021 0441      Lab Results  Component Value Date   TSH 0.440 08/03/2020   TSH 0.266 (L) 09/08/2015   TSH 0.696 08/27/2013   TSH 0.025 (L) 05/27/2013           Assessment & Plan:   1) Uncontrolled type 2 diabetes mellitus with hyperglycemia ([place])  She presents today for her consultation, accompanied by her family and hired caregiver, with glucose  readings showing fluctuating glycemic profile.  Her most recent A1c was 10.8% on 9/6 during a recent hospitalization.  Says she was just then diagnosed with diabetes.  She was started on premixed insulin twice daily but has only been taking it once daily due to glucose readings being on the lower side.  She monitors her glucose 4 times daily.  She eats 3 meals per day, no snacks.  She does admit to drinking sugary beverages such as Diet Mt Dew and water with crystal light.  She does not engage in routine physical activity due to decreased mobility since her hospitalization.  She denies any hypoglycemia.  - Amanda Armstrong has currently uncontrolled symptomatic type 2 DM since 75 years of age(just recently diagnosed during her recent hospitalization), with most recent A1c of 10.8 %.   -Recent labs reviewed.  - I had a long discussion with her about the progressive nature of diabetes and the pathology behind its complications. -her diabetes is complicated by CKD stage 3, chronic smoking and she remains at a high risk for more acute and chronic complications which include CAD, CVA, CKD, retinopathy, and neuropathy. These are all discussed in detail with her.  - I have counseled her on diet and weight management by adopting a carbohydrate restricted/protein rich diet. Patient is encouraged to switch to unprocessed or minimally processed complex starch and increased protein intake (animal or plant source), fruits, and vegetables. -  she is advised to stick to a routine mealtimes to eat 3 meals a day and avoid unnecessary snacks (to snack only to correct hypoglycemia).   - she acknowledges  that there is a room for improvement in her food and drink choices. - Suggestion is made for her to avoid simple carbohydrates from her diet including Cakes, Sweet Desserts, Ice Cream, Soda (diet and regular), Sweet Tea, Candies, Chips, Cookies, Store Bought Juices, Alcohol in Excess of 1-2 drinks a day, Artificial  Sweeteners, Coffee Creamer, and "Sugar-free" Products. This will help patient to have more stable blood glucose profile and potentially avoid unintended weight gain.  - she will be scheduled with Jearld Fenton, RDN, CDE for diabetes education.  - I have approached her with the following individualized plan to manage her diabetes and patient agrees:   -She is advised to adjust her Novolin 70/30 to 6 units SQ BID with meals if glucose is above 90 and she is eating.  -she is encouraged to continue monitoring glucose 4 times daily, before meals and before bed, to log their readings on the clinic sheets provided, and bring them to review at follow up appointment in 2 weeks.  - she is warned not to take insulin without proper monitoring per orders. - Adjustment parameters are given to her for hypo and hyperglycemia in writing. - she is encouraged to call clinic for blood glucose levels less than 70 or above 300 mg /dl.  - she is not a candidate for Metformin, SGLT2i due to concurrent renal insufficiency.  - she is not a candidate for incretin therapy due to heavy smoking history increasing her risk of pancreatitis.  - Specific targets for  A1c; LDL, HDL, and Triglycerides were discussed with the patient.  2) Blood Pressure /Hypertension:  her blood pressure is controlled to target.   she is advised to continue her current medications including Lasix 20 mg p.o. daily with breakfast.  3) Lipids/Hyperlipidemia:    Review of her recent lipid panel from 03/23/21 showed controlled LDL at 51 and elevated triglycerides of 197 .  she is advised to continue Lipitor 20 mg daily at bedtime.  Side effects and precautions discussed with her.  4)  Weight/Diet:  her There is no height or weight on file to calculate BMI.  -  clearly complicating her diabetes care.   she is a candidate for weight loss. I discussed with her the fact that loss of 5 - 10% of her  current body weight will have the most impact on her  diabetes management.  Exercise, and detailed carbohydrates information provided  -  detailed on discharge instructions.  5) Chronic Care/Health Maintenance: -she is not on ACEI/ARB and is on Statin medications and is encouraged to initiate and continue to follow up with Ophthalmology, Dentist, Podiatrist at least yearly or according to recommendations, and advised to [place]. I have recommended yearly flu vaccine and pneumonia vaccine at least every 5 years; moderate intensity exercise for up to 150 minutes weekly; and sleep for at least 7 hours a day.  - she is advised to maintain close follow up with Redmond School, MD for primary care needs, as well as her other providers for optimal and coordinated care.   - Time spent in this patient care: 60 min, of which > 50% was spent in counseling her about her diabetes and the rest reviewing her blood glucose logs, discussing her hypoglycemia and hyperglycemia episodes, reviewing her current and previous labs/studies (including abstraction from other facilities) and medications doses and developing a long term treatment plan based on the latest standards of care/guidelines; and documenting her care.    Please refer to Patient Instructions  for Blood Glucose Monitoring and Insulin/Medications Dosing Guide" in media tab for additional information. Please also refer to "Patient Self Inventory" in the Media tab for reviewed elements of pertinent patient history.  Amanda Armstrong participated in the discussions, expressed understanding, and voiced agreement with the above plans.  All questions were answered to her satisfaction. she is encouraged to contact clinic should she have any questions or concerns prior to her return visit.     Follow up plan: - Return in about 2 weeks (around 04/16/2021) for Diabetes F/U, Bring meter and logs.    Rayetta Pigg, [facility] [address] [place], [place] [address] Phone:  [phone] Fax: [phone]  04/02/2021, 11:40 AM

## 2021-04-05 ENCOUNTER — Telehealth: Payer: Self-pay

## 2021-04-05 DIAGNOSIS — F17210 Nicotine dependence, cigarettes, uncomplicated: Secondary | ICD-10-CM | POA: Diagnosis not present

## 2021-04-05 DIAGNOSIS — N183 Chronic kidney disease, stage 3 unspecified: Secondary | ICD-10-CM | POA: Diagnosis not present

## 2021-04-05 DIAGNOSIS — R2681 Unsteadiness on feet: Secondary | ICD-10-CM | POA: Diagnosis not present

## 2021-04-05 DIAGNOSIS — M159 Polyosteoarthritis, unspecified: Secondary | ICD-10-CM | POA: Diagnosis not present

## 2021-04-05 DIAGNOSIS — E038 Other specified hypothyroidism: Secondary | ICD-10-CM

## 2021-04-05 DIAGNOSIS — J449 Chronic obstructive pulmonary disease, unspecified: Secondary | ICD-10-CM | POA: Diagnosis not present

## 2021-04-05 DIAGNOSIS — U099 Post covid-19 condition, unspecified: Secondary | ICD-10-CM | POA: Diagnosis not present

## 2021-04-05 DIAGNOSIS — M810 Age-related osteoporosis without current pathological fracture: Secondary | ICD-10-CM | POA: Diagnosis not present

## 2021-04-05 DIAGNOSIS — E063 Autoimmune thyroiditis: Secondary | ICD-10-CM | POA: Diagnosis not present

## 2021-04-05 DIAGNOSIS — R5383 Other fatigue: Secondary | ICD-10-CM | POA: Diagnosis not present

## 2021-04-05 NOTE — Telephone Encounter (Signed)
Pt's daughter, [name] called and said she would like you to place orders for her to get her thyroid checked.

## 2021-04-05 NOTE — Telephone Encounter (Signed)
Pt's daughter notified.

## 2021-04-06 DIAGNOSIS — E038 Other specified hypothyroidism: Secondary | ICD-10-CM | POA: Diagnosis not present

## 2021-04-07 ENCOUNTER — Other Ambulatory Visit: Payer: Self-pay | Admitting: Nurse Practitioner

## 2021-04-07 DIAGNOSIS — E039 Hypothyroidism, unspecified: Secondary | ICD-10-CM

## 2021-04-07 LAB — THYROGLOBULIN ANTIBODY: Thyroglobulin Antibody: 1.7 IU/mL — ABNORMAL HIGH (ref 0.0–0.9)

## 2021-04-07 LAB — THYROID PEROXIDASE ANTIBODY: Thyroperoxidase Ab SerPl-aCnc: 40 IU/mL — ABNORMAL HIGH (ref 0–34)

## 2021-04-07 LAB — TSH: TSH: 17.8 u[IU]/mL — ABNORMAL HIGH (ref 0.450–4.500)

## 2021-04-07 LAB — T3, FREE: T3, Free: 2 pg/mL (ref 2.0–4.4)

## 2021-04-07 LAB — T4, FREE: Free T4: 1.36 ng/dL (ref 0.82–1.77)

## 2021-04-07 MED ORDER — LEVOTHYROXINE SODIUM 25 MCG PO TABS: 25.0000 ug | ORAL_TABLET | Freq: Every day | ORAL | 1 refills | Status: DC

## 2021-04-07 NOTE — Progress Notes (Signed)
Her thyroid labs show she needs to restart her thyroid hormone supplement.  We can start her at a low dose first to see how she feels.  I will go ahead and send in Levothyroxine 25 mcg po daily before breakfast (sent to [facility]).  Make sure to tell her she needs to take it by itself, waiting at least 30 minutes to an hour before taking her other medications, and waiting 4 hours to take calcium supplements, multivitamins, or acid reflux medication.

## 2021-04-08 DIAGNOSIS — R5383 Other fatigue: Secondary | ICD-10-CM | POA: Diagnosis not present

## 2021-04-08 DIAGNOSIS — U099 Post covid-19 condition, unspecified: Secondary | ICD-10-CM | POA: Diagnosis not present

## 2021-04-08 DIAGNOSIS — M159 Polyosteoarthritis, unspecified: Secondary | ICD-10-CM | POA: Diagnosis not present

## 2021-04-08 DIAGNOSIS — E063 Autoimmune thyroiditis: Secondary | ICD-10-CM | POA: Diagnosis not present

## 2021-04-08 DIAGNOSIS — R2681 Unsteadiness on feet: Secondary | ICD-10-CM | POA: Diagnosis not present

## 2021-04-08 DIAGNOSIS — J449 Chronic obstructive pulmonary disease, unspecified: Secondary | ICD-10-CM | POA: Diagnosis not present

## 2021-04-08 DIAGNOSIS — F17210 Nicotine dependence, cigarettes, uncomplicated: Secondary | ICD-10-CM | POA: Diagnosis not present

## 2021-04-08 DIAGNOSIS — N183 Chronic kidney disease, stage 3 unspecified: Secondary | ICD-10-CM | POA: Diagnosis not present

## 2021-04-08 DIAGNOSIS — M810 Age-related osteoporosis without current pathological fracture: Secondary | ICD-10-CM | POA: Diagnosis not present

## 2021-04-12 DIAGNOSIS — J449 Chronic obstructive pulmonary disease, unspecified: Secondary | ICD-10-CM | POA: Diagnosis not present

## 2021-04-12 DIAGNOSIS — U099 Post covid-19 condition, unspecified: Secondary | ICD-10-CM | POA: Diagnosis not present

## 2021-04-12 DIAGNOSIS — M159 Polyosteoarthritis, unspecified: Secondary | ICD-10-CM | POA: Diagnosis not present

## 2021-04-12 DIAGNOSIS — R2681 Unsteadiness on feet: Secondary | ICD-10-CM | POA: Diagnosis not present

## 2021-04-12 DIAGNOSIS — F17210 Nicotine dependence, cigarettes, uncomplicated: Secondary | ICD-10-CM | POA: Diagnosis not present

## 2021-04-12 DIAGNOSIS — N183 Chronic kidney disease, stage 3 unspecified: Secondary | ICD-10-CM | POA: Diagnosis not present

## 2021-04-12 DIAGNOSIS — E063 Autoimmune thyroiditis: Secondary | ICD-10-CM | POA: Diagnosis not present

## 2021-04-12 DIAGNOSIS — R5383 Other fatigue: Secondary | ICD-10-CM | POA: Diagnosis not present

## 2021-04-12 DIAGNOSIS — M810 Age-related osteoporosis without current pathological fracture: Secondary | ICD-10-CM | POA: Diagnosis not present

## 2021-04-14 ENCOUNTER — Telehealth: Payer: Self-pay

## 2021-04-14 ENCOUNTER — Other Ambulatory Visit: Payer: Self-pay

## 2021-04-14 DIAGNOSIS — U099 Post covid-19 condition, unspecified: Secondary | ICD-10-CM | POA: Diagnosis not present

## 2021-04-14 DIAGNOSIS — J449 Chronic obstructive pulmonary disease, unspecified: Secondary | ICD-10-CM | POA: Diagnosis not present

## 2021-04-14 DIAGNOSIS — M810 Age-related osteoporosis without current pathological fracture: Secondary | ICD-10-CM | POA: Diagnosis not present

## 2021-04-14 DIAGNOSIS — E063 Autoimmune thyroiditis: Secondary | ICD-10-CM | POA: Diagnosis not present

## 2021-04-14 DIAGNOSIS — N183 Chronic kidney disease, stage 3 unspecified: Secondary | ICD-10-CM | POA: Diagnosis not present

## 2021-04-14 DIAGNOSIS — R5383 Other fatigue: Secondary | ICD-10-CM | POA: Diagnosis not present

## 2021-04-14 DIAGNOSIS — M159 Polyosteoarthritis, unspecified: Secondary | ICD-10-CM | POA: Diagnosis not present

## 2021-04-14 DIAGNOSIS — F17210 Nicotine dependence, cigarettes, uncomplicated: Secondary | ICD-10-CM | POA: Diagnosis not present

## 2021-04-14 DIAGNOSIS — R2681 Unsteadiness on feet: Secondary | ICD-10-CM | POA: Diagnosis not present

## 2021-04-14 MED ORDER — ACCU-CHEK GUIDE VI STRP: ORAL_STRIP | 1 refills | Status: DC

## 2021-04-14 NOTE — Telephone Encounter (Signed)
Pt requesting refill on accu test strips.   Please send to [facility].

## 2021-04-14 NOTE — Telephone Encounter (Signed)
Sent prescription to the requested pharmacy.

## 2021-04-16 DIAGNOSIS — J449 Chronic obstructive pulmonary disease, unspecified: Secondary | ICD-10-CM | POA: Diagnosis not present

## 2021-04-16 DIAGNOSIS — E782 Mixed hyperlipidemia: Secondary | ICD-10-CM | POA: Diagnosis not present

## 2021-04-16 NOTE — Patient Instructions (Signed)

## 2021-04-19 ENCOUNTER — Other Ambulatory Visit: Payer: Self-pay

## 2021-04-19 ENCOUNTER — Ambulatory Visit (INDEPENDENT_AMBULATORY_CARE_PROVIDER_SITE_OTHER): Payer: Medicare Other | Admitting: Nurse Practitioner

## 2021-04-19 ENCOUNTER — Encounter: Payer: Self-pay | Admitting: Nurse Practitioner

## 2021-04-19 VITALS — BP 102/65 | HR 80

## 2021-04-19 DIAGNOSIS — E063 Autoimmune thyroiditis: Secondary | ICD-10-CM

## 2021-04-19 DIAGNOSIS — E782 Mixed hyperlipidemia: Secondary | ICD-10-CM

## 2021-04-19 DIAGNOSIS — E1165 Type 2 diabetes mellitus with hyperglycemia: Secondary | ICD-10-CM | POA: Diagnosis not present

## 2021-04-19 DIAGNOSIS — E039 Hypothyroidism, unspecified: Secondary | ICD-10-CM

## 2021-04-19 DIAGNOSIS — E038 Other specified hypothyroidism: Secondary | ICD-10-CM

## 2021-04-19 MED ORDER — TRESIBA FLEXTOUCH 100 UNIT/ML ~~LOC~~ SOPN: 10.0000 [IU] | PEN_INJECTOR | Freq: Every day | SUBCUTANEOUS | 2 refills | Status: DC

## 2021-04-19 NOTE — Progress Notes (Signed)
Endocrinology Follow Up Note       04/19/2021, 11:00 AM   Subjective:    Patient ID: Amanda Armstrong, female    DOB: 05-31-1946.  Amanda Armstrong is being seen in follow up after being seen in consultation for management of currently uncontrolled symptomatic diabetes requested by  Redmond School, MD.   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    pt stated that she has intermittent sx   Clostridium difficile infection    Daytime somnolence    Frequent falls    Headache(784.0)    SINUS HEADACHE OCCASSIONALLY   Hypertension    Hypothyroidism    Multifactorial gait disorder    Osteoporosis    Right foot drop    Snoring    Stroke ([place])    Tobacco use    Vertigo    Weakness     Past Surgical History:  Procedure Laterality Date   CATARACT EXTRACTION W/PHACO Right 06/28/2016   Procedure: CATARACT EXTRACTION PHACO AND INTRAOCULAR LENS PLACEMENT ([place]);  Surgeon: Rutherford Guys, MD;  Location: [facility];  Service: Ophthalmology;  Laterality: Right;  CDE: 11.10   CATARACT EXTRACTION W/PHACO Left 07/26/2016   Procedure: CATARACT EXTRACTION PHACO AND INTRAOCULAR LENS PLACEMENT ([facility]);  Surgeon: Rutherford Guys, MD;  Location: [facility];  Service: Ophthalmology;  Laterality: Left;  CDE: 8.88   JOINT REPLACEMENT  11/08/3010   right total knee   TOTAL KNEE ARTHROPLASTY  11/07/2011   Procedure: TOTAL KNEE ARTHROPLASTY;  Surgeon: Lorn Junes, MD;  Location: [facility];  Service: Orthopedics;  Laterality: Left;  DR Ephriam Knuckles 83 MINUTES FOR THIS CASE   TOTAL KNEE ARTHROPLASTY Right 2011   TUBAL LIGATION  1982    Social History   Socioeconomic History   Marital status: Married    Spouse name: Jeneen Rinks   Number of children: 4   Years of education: [employer]   Highest education level: Not on file   Occupational History   Not on file  Tobacco Use   Smoking status: Every Day    Packs/day: 1.00    Years: 48.00    Pack years: 48.00    Types: Cigarettes    Last attempt to quit: 06/15/2012    Years since quitting: 8.8   Smokeless tobacco: Never   Tobacco comments:    12/29/14 1/2 PPD  Vaping Use   Vaping Use: Never used  Substance and Sexual Activity   Alcohol use: No    Alcohol/week: 0.0 standard drinks   Drug use: No   Sexual activity: Never    Birth control/protection: Surgical  Other Topics Concern   Not on file  Social History Narrative   1 cup of coffee a day    Social Determinants of Health   Financial Resource Strain: Not on file  Food Insecurity: Not on file  Transportation Needs: Not on file  Physical Activity: Not on file  Stress: Not on file  Social Connections: Not on file    Family History  Problem Relation Age  of Onset   Heart attack Mother    COPD Father    Arthritis Sister    Heart disease Brother     Outpatient Encounter Medications as of 04/19/2021  Medication Sig   ACCU-CHEK GUIDE test strip Use as instructed to test blood glucose 4 times daily. Dx Code E11.65   Accu-Chek Softclix Lancets lancets SMARTSIG:1 Topical 4 Times Daily   alendronate (FOSAMAX) 70 MG tablet Take 70 mg by mouth once a week.   ALPRAZolam (XANAX) 1 MG tablet Take 1 mg by mouth at bedtime.   aspirin 81 MG EC tablet Take 81 mg by mouth in the morning.   atorvastatin (LIPITOR) 20 MG tablet Take 20 mg by mouth daily at 12 noon.   blood glucose meter kit and supplies Dispense based on patient and insurance preference. Use up to four times daily as directed. (FOR ICD-10 E10.9, E11.9).   Cholecalciferol (VITAMIN D3) 50 MCG (2000 UT) capsule Take 2,000 Units by mouth daily.   furosemide (LASIX) 20 MG tablet Take 20 mg by mouth daily.   HYDROcodone-acetaminophen (NORCO) 10-325 MG tablet Take 1 tablet by mouth every 4 (four) hours as needed.   insulin degludec (TRESIBA FLEXTOUCH)  100 UNIT/ML FlexTouch Pen Inject 10 Units into the skin at bedtime.   Insulin Pen Needle (PEN NEEDLES 3/16") 31G X 5 MM MISC 100 each by Does not apply route 2 (two) times daily.   levalbuterol (XOPENEX HFA) 45 MCG/ACT inhaler Inhale 2 puffs into the lungs 2 (two) times daily.   levothyroxine (SYNTHROID) 25 MCG tablet Take 1 tablet (25 mcg total) by mouth daily before breakfast.   OXYGEN Inhale 1 L/min into the lungs daily.   RELION PEN NEEDLES 31G X 6 MM MISC USE 1 PEN NEEDLE TWICE DAILY   triamcinolone cream (KENALOG) 0.1 % SMARTSIG:1 Application Topical 2-3 Times Daily   [DISCONTINUED] insulin isophane & regular human KwikPen (HUMULIN 70/30 KWIKPEN) (70-30) 100 UNIT/ML KwikPen Inject 6 Units into the skin 2 (two) times daily.   No facility-administered encounter medications on file as of 04/19/2021.    ALLERGIES: Allergies  Allergen Reactions   Iron Other (See Comments)    "upset stomach"   Oxycodone Other (See Comments)    "CX HER TO FELL LIKE SHE IS OUT OF HER BODY"   Sudafed [Pseudoephedrine Hcl] Other (See Comments)    "funny feeling"    VACCINATION STATUS:  There is no immunization history on file for this patient.  Diabetes She presents for her follow-up diabetic visit. She has type 2 diabetes mellitus. Her disease course has been improving. There are no hypoglycemic associated symptoms. Associated symptoms include fatigue. There are no hypoglycemic complications. Symptoms are improving. Diabetic complications include nephropathy. Risk factors for coronary artery disease include diabetes mellitus, dyslipidemia, obesity, sedentary lifestyle, family history, tobacco exposure and post-menopausal. Current diabetic treatment includes insulin injections. She is compliant with treatment most of the time. Her weight is fluctuating minimally. She is following a generally unhealthy diet. When asked about meal planning, she reported none. She has not had a previous visit with a dietitian.  She never (unable to engage in routine physical activity due to mobility deficits) participates in exercise. Her home blood glucose trend is decreasing steadily. Her breakfast blood glucose range is generally 70-90 mg/dl. Her lunch blood glucose range is generally 110-130 mg/dl. Her dinner blood glucose range is generally 110-130 mg/dl. Her bedtime blood glucose range is generally 110-130 mg/dl. (She presents today, accompanied by her family, with her  meter and logs showing tight fasting and at goal postprandial glycemic profile.  She was not due for another A1c today.  She denies any significant hypoglycemia.) An ACE inhibitor/angiotensin II receptor blocker is not being taken. She does not see a podiatrist.Eye exam is not current.  Hyperlipidemia This is a chronic problem. The current episode started more than 1 year ago. The problem is uncontrolled. Recent lipid tests were reviewed and are variable. Exacerbating diseases include chronic renal disease, diabetes, hypothyroidism and obesity. Factors aggravating her hyperlipidemia include smoking and fatty foods. Current antihyperlipidemic treatment includes statins. The current treatment provides mild improvement of lipids. Compliance problems include adherence to diet and adherence to exercise.  Risk factors for coronary artery disease include diabetes mellitus, dyslipidemia, family history, obesity, a sedentary lifestyle and post-menopausal.  Thyroid Problem Presents for initial visit. Symptoms include cold intolerance, depressed mood and fatigue. The symptoms have been worsening. Past treatments include levothyroxine (her thyroid hormone was stopped for unknown reasons). Her past medical history is significant for diabetes, hyperlipidemia and obesity.    Review of systems  Constitutional: + Minimally fluctuating body weight, current There is no height or weight on file to calculate BMI., + fatigue-much improved, no subjective hyperthermia, no subjective  hypothermia Eyes: no blurry vision, no xerophthalmia ENT: no sore throat, no nodules palpated in throat, no dysphagia/odynophagia, no hoarseness Cardiovascular: no chest pain, no shortness of breath, no palpitations, no leg swelling Respiratory: no cough, COPD on supplemental oxygen (not currently on during the visit) Gastrointestinal: no nausea/vomiting/diarrhea Musculoskeletal: hx of chronic pain, recently hospitalized for encephalopathy- currently in wheelchair Skin: no rashes, no hyperemia Neurological: no tremors, no numbness, no tingling, no dizziness Psychiatric: no depression, no anxiety  Objective:     BP 102/65   Pulse 80   Wt Readings from Last 3 Encounters:  03/25/21 163 lb 12.8 oz (74.3 kg)  08/31/20 151 lb 9.6 oz (68.8 kg)  08/17/20 158 lb 6.4 oz (71.8 kg)     BP Readings from Last 3 Encounters:  04/19/21 102/65  04/02/21 105/65  03/25/21 (!) 131/54      Physical Exam- Limited  Constitutional:  There is no height or weight on file to calculate BMI. , not in acute distress, normal state of mind Eyes:  EOMI, no exophthalmos Neck: Supple Cardiovascular: RRR, no murmurs, rubs, or gallops, no edema Respiratory: Adequate breathing efforts, no crackles, rales, rhonchi, or wheezing Musculoskeletal: no gross deformities, strength intact in all four extremities, no gross restriction of joint movements, essentially WC bound due to deconditioning Skin:  no rashes, no hyperemia Neurological: no tremor with outstretched hands    CMP ( most recent) CMP     Component Value Date/Time   NA 140 03/30/2021 0000   K 4.2 03/30/2021 0000   CL 101 03/30/2021 0000   CO2 23 (A) 03/30/2021 0000   GLUCOSE 82 03/25/2021 0509   BUN 11 03/30/2021 0000   CREATININE 1.2 (A) 03/30/2021 0000   CREATININE 1.14 (H) 03/25/2021 0509   CALCIUM 9.4 03/30/2021 0000   PROT 5.9 (L) 03/25/2021 0509   ALBUMIN 3.2 (L) 03/25/2021 0509   AST 47 (H) 03/25/2021 0509   ALT 45 (H) 03/25/2021 0509    ALKPHOS 71 03/25/2021 0509   BILITOT 0.5 03/25/2021 0509   GFRNONAA 50 (L) 03/25/2021 0509   GFRAA 59.61 08/13/2020 0000     Diabetic Labs (most recent): Lab Results  Component Value Date   HGBA1C 10.8 (H) 03/23/2021   HGBA1C 10.9 (H) 03/22/2021  HGBA1C 6.7 (H) 08/03/2020     Lipid Panel ( most recent) Lipid Panel     Component Value Date/Time   CHOL 112 03/23/2021 0441   TRIG 197 (H) 03/23/2021 0441   HDL 22 (L) 03/23/2021 0441   CHOLHDL 5.1 03/23/2021 0441   VLDL 39 03/23/2021 0441   LDLCALC 51 03/23/2021 0441      Lab Results  Component Value Date   TSH 17.800 (H) 04/06/2021   TSH 0.440 08/03/2020   TSH 0.266 (L) 09/08/2015   TSH 0.696 08/27/2013   TSH 0.025 (L) 05/27/2013   FREET4 1.36 04/06/2021           Assessment & Plan:   1) Uncontrolled type 2 diabetes mellitus with hyperglycemia ([place])  She presents today, accompanied by her family, with her meter and logs showing tight fasting and at goal postprandial glycemic profile.  She was not due for another A1c today.  She denies any significant hypoglycemia.  - Samantha P Fulco has currently uncontrolled symptomatic type 2 DM since 75 years of age(just recently diagnosed during her recent hospitalization), with most recent A1c of 10.8 %.   -Recent labs reviewed.  - I had a long discussion with her about the progressive nature of diabetes and the pathology behind its complications. -her diabetes is complicated by CKD stage 3, chronic smoking and she remains at a high risk for more acute and chronic complications which include CAD, CVA, CKD, retinopathy, and neuropathy. These are all discussed in detail with her.  - Nutritional counseling repeated at each appointment due to patients tendency to fall back in to old habits.  - The patient admits there is a room for improvement in their diet and drink choices. -  Suggestion is made for the patient to avoid simple carbohydrates from their diet including  Cakes, Sweet Desserts / Pastries, Ice Cream, Soda (diet and regular), Sweet Tea, Candies, Chips, Cookies, Sweet Pastries, Store Bought Juices, Alcohol in Excess of 1-2 drinks a day, Artificial Sweeteners, Coffee Creamer, and "Sugar-free" Products. This will help patient to have stable blood glucose profile and potentially avoid unintended weight gain.   - I encouraged the patient to switch to unprocessed or minimally processed complex starch and increased protein intake (animal or plant source), fruits, and vegetables.   - Patient is advised to stick to a routine mealtimes to eat 3 meals a day and avoid unnecessary snacks (to snack only to correct hypoglycemia).  - she will be scheduled with Jearld Fenton, RDN, CDE for diabetes education.  - I have approached her with the following individualized plan to manage her diabetes and patient agrees:   -In efforts to simplify her regimen, she will be changed to basal insulin only.  She is advised to start Tresiba 10 units SQ nightly.  She is warned not to mix the 70/30 and the [place].   -she is encouraged to continue monitoring glucose twice daily, before breakfast and before bed, and to call the clinic if she has readings less than 70 or above 200 for 3 tests in a row.   - she is warned not to take insulin without proper monitoring per orders. - Adjustment parameters are given to her for hypo and hyperglycemia in writing.  - she is not a candidate for Metformin, SGLT2i due to concurrent renal insufficiency.  - she is not a candidate for incretin therapy due to heavy smoking history increasing her risk of pancreatitis.  - Specific targets for  A1c; LDL, HDL,  and Triglycerides were discussed with the patient.  2) Blood Pressure /Hypertension:  her blood pressure is controlled to target.   she is advised to continue her current medications including Lasix 20 mg p.o. daily with breakfast.  3) Lipids/Hyperlipidemia:    Review of her recent lipid  panel from 03/23/21 showed controlled LDL at 51 and elevated triglycerides of 197 .  she is advised to continue Lipitor 20 mg daily at bedtime.  Side effects and precautions discussed with her.  4)  Weight/Diet:  her There is no height or weight on file to calculate BMI.  -  clearly complicating her diabetes care.   she is a candidate for weight loss. I discussed with her the fact that loss of 5 - 10% of her  current body weight will have the most impact on her diabetes management.  Exercise, and detailed carbohydrates information provided  -  detailed on discharge instructions.  5) Hypothyroidism r/t Hashimoto's thyroiditis Her most recent thyroid function tests are consistent with under-replacement- an expected finding as her thyroid supplementation was discontinued during her recent hospitalization.  She was restarted on Levothyroxine 25 mcg po daily before breakfast in between visits and is advised to continue until next appt where labs will be checked.   - The correct intake of thyroid hormone (Levothyroxine, Synthroid), is on empty stomach first thing in the morning, with water, separated by at least 30 minutes from breakfast and other medications,  and separated by more than 4 hours from calcium, iron, multivitamins, acid reflux medications (PPIs).  - This medication is a life-long medication and will be needed to correct thyroid hormone imbalances for the rest of your life.  The dose may change from time to time, based on thyroid blood work.  - It is extremely important to be consistent taking this medication, near the same time each morning.  -AVOID TAKING PRODUCTS CONTAINING BIOTIN (commonly found in Hair, Skin, Nails vitamins) AS IT INTERFERES WITH THE VALIDITY OF THYROID FUNCTION BLOOD TESTS.  6) Chronic Care/Health Maintenance: -she is not on ACEI/ARB and is on Statin medications and is encouraged to initiate and continue to follow up with Ophthalmology, Dentist, Podiatrist at least  yearly or according to recommendations, and advised to [place]. I have recommended yearly flu vaccine and pneumonia vaccine at least every 5 years; moderate intensity exercise for up to 150 minutes weekly; and sleep for at least 7 hours a day.  - she is advised to maintain close follow up with Redmond School, MD for primary care needs, as well as her other providers for optimal and coordinated care.     I spent 40 minutes in the care of the patient today including review of labs from [place], Lipids, Thyroid Function, Hematology (current and previous including abstractions from other facilities); face-to-face time discussing  her blood glucose readings/logs, discussing hypoglycemia and hyperglycemia episodes and symptoms, medications doses, her options of short and long term treatment based on the latest standards of care / guidelines;  discussion about incorporating lifestyle medicine;  and documenting the encounter.    Please refer to Patient Instructions for Blood Glucose Monitoring and Insulin/Medications Dosing Guide"  in media tab for additional information. Please  also refer to " Patient Self Inventory" in the Media  tab for reviewed elements of pertinent patient history.  Aliene Altes participated in the discussions, expressed understanding, and voiced agreement with the above plans.  All questions were answered to her satisfaction. she is encouraged to contact clinic  should she have any questions or concerns prior to her return visit.     Follow up plan: - Return in about 3 months (around 07/20/2021) for Diabetes F/U with A1c in office, Thyroid follow up, Previsit labs, Bring meter and logs.    Rayetta Pigg, [facility] [address] [place], [place] [address] Phone: [phone] Fax: [phone]  04/19/2021, 11:00 AM

## 2021-04-20 DIAGNOSIS — E063 Autoimmune thyroiditis: Secondary | ICD-10-CM | POA: Diagnosis not present

## 2021-04-20 DIAGNOSIS — M159 Polyosteoarthritis, unspecified: Secondary | ICD-10-CM | POA: Diagnosis not present

## 2021-04-20 DIAGNOSIS — N183 Chronic kidney disease, stage 3 unspecified: Secondary | ICD-10-CM | POA: Diagnosis not present

## 2021-04-20 DIAGNOSIS — M810 Age-related osteoporosis without current pathological fracture: Secondary | ICD-10-CM | POA: Diagnosis not present

## 2021-04-20 DIAGNOSIS — R5383 Other fatigue: Secondary | ICD-10-CM | POA: Diagnosis not present

## 2021-04-20 DIAGNOSIS — U099 Post covid-19 condition, unspecified: Secondary | ICD-10-CM | POA: Diagnosis not present

## 2021-04-20 DIAGNOSIS — F17210 Nicotine dependence, cigarettes, uncomplicated: Secondary | ICD-10-CM | POA: Diagnosis not present

## 2021-04-20 DIAGNOSIS — J449 Chronic obstructive pulmonary disease, unspecified: Secondary | ICD-10-CM | POA: Diagnosis not present

## 2021-04-20 DIAGNOSIS — R2681 Unsteadiness on feet: Secondary | ICD-10-CM | POA: Diagnosis not present

## 2021-04-22 DIAGNOSIS — N183 Chronic kidney disease, stage 3 unspecified: Secondary | ICD-10-CM | POA: Diagnosis not present

## 2021-04-22 DIAGNOSIS — M810 Age-related osteoporosis without current pathological fracture: Secondary | ICD-10-CM | POA: Diagnosis not present

## 2021-04-22 DIAGNOSIS — J449 Chronic obstructive pulmonary disease, unspecified: Secondary | ICD-10-CM | POA: Diagnosis not present

## 2021-04-22 DIAGNOSIS — R5383 Other fatigue: Secondary | ICD-10-CM | POA: Diagnosis not present

## 2021-04-22 DIAGNOSIS — U099 Post covid-19 condition, unspecified: Secondary | ICD-10-CM | POA: Diagnosis not present

## 2021-04-22 DIAGNOSIS — E063 Autoimmune thyroiditis: Secondary | ICD-10-CM | POA: Diagnosis not present

## 2021-04-22 DIAGNOSIS — R2681 Unsteadiness on feet: Secondary | ICD-10-CM | POA: Diagnosis not present

## 2021-04-22 DIAGNOSIS — F17210 Nicotine dependence, cigarettes, uncomplicated: Secondary | ICD-10-CM | POA: Diagnosis not present

## 2021-04-22 DIAGNOSIS — M159 Polyosteoarthritis, unspecified: Secondary | ICD-10-CM | POA: Diagnosis not present

## 2021-04-26 DIAGNOSIS — J449 Chronic obstructive pulmonary disease, unspecified: Secondary | ICD-10-CM | POA: Diagnosis not present

## 2021-04-26 DIAGNOSIS — R5383 Other fatigue: Secondary | ICD-10-CM | POA: Diagnosis not present

## 2021-04-26 DIAGNOSIS — E063 Autoimmune thyroiditis: Secondary | ICD-10-CM | POA: Diagnosis not present

## 2021-04-26 DIAGNOSIS — M159 Polyosteoarthritis, unspecified: Secondary | ICD-10-CM | POA: Diagnosis not present

## 2021-04-26 DIAGNOSIS — N183 Chronic kidney disease, stage 3 unspecified: Secondary | ICD-10-CM | POA: Diagnosis not present

## 2021-04-26 DIAGNOSIS — U099 Post covid-19 condition, unspecified: Secondary | ICD-10-CM | POA: Diagnosis not present

## 2021-04-26 DIAGNOSIS — R2681 Unsteadiness on feet: Secondary | ICD-10-CM | POA: Diagnosis not present

## 2021-04-26 DIAGNOSIS — M810 Age-related osteoporosis without current pathological fracture: Secondary | ICD-10-CM | POA: Diagnosis not present

## 2021-04-26 DIAGNOSIS — F17210 Nicotine dependence, cigarettes, uncomplicated: Secondary | ICD-10-CM | POA: Diagnosis not present

## 2021-04-30 DIAGNOSIS — U071 COVID-19: Secondary | ICD-10-CM | POA: Diagnosis not present

## 2021-04-30 DIAGNOSIS — J449 Chronic obstructive pulmonary disease, unspecified: Secondary | ICD-10-CM | POA: Diagnosis not present

## 2021-04-30 DIAGNOSIS — E063 Autoimmune thyroiditis: Secondary | ICD-10-CM | POA: Diagnosis not present

## 2021-04-30 DIAGNOSIS — G894 Chronic pain syndrome: Secondary | ICD-10-CM | POA: Diagnosis not present

## 2021-04-30 DIAGNOSIS — J9601 Acute respiratory failure with hypoxia: Secondary | ICD-10-CM | POA: Diagnosis not present

## 2021-05-01 DIAGNOSIS — U071 COVID-19: Secondary | ICD-10-CM | POA: Diagnosis not present

## 2021-05-01 DIAGNOSIS — J449 Chronic obstructive pulmonary disease, unspecified: Secondary | ICD-10-CM | POA: Diagnosis not present

## 2021-05-01 DIAGNOSIS — J9601 Acute respiratory failure with hypoxia: Secondary | ICD-10-CM | POA: Diagnosis not present

## 2021-05-03 DIAGNOSIS — M159 Polyosteoarthritis, unspecified: Secondary | ICD-10-CM | POA: Diagnosis not present

## 2021-05-03 DIAGNOSIS — N183 Chronic kidney disease, stage 3 unspecified: Secondary | ICD-10-CM | POA: Diagnosis not present

## 2021-05-03 DIAGNOSIS — J449 Chronic obstructive pulmonary disease, unspecified: Secondary | ICD-10-CM | POA: Diagnosis not present

## 2021-05-03 DIAGNOSIS — M810 Age-related osteoporosis without current pathological fracture: Secondary | ICD-10-CM | POA: Diagnosis not present

## 2021-05-03 DIAGNOSIS — U099 Post covid-19 condition, unspecified: Secondary | ICD-10-CM | POA: Diagnosis not present

## 2021-05-03 DIAGNOSIS — R5383 Other fatigue: Secondary | ICD-10-CM | POA: Diagnosis not present

## 2021-05-03 DIAGNOSIS — F17210 Nicotine dependence, cigarettes, uncomplicated: Secondary | ICD-10-CM | POA: Diagnosis not present

## 2021-05-03 DIAGNOSIS — E063 Autoimmune thyroiditis: Secondary | ICD-10-CM | POA: Diagnosis not present

## 2021-05-03 DIAGNOSIS — R2681 Unsteadiness on feet: Secondary | ICD-10-CM | POA: Diagnosis not present

## 2021-05-10 DIAGNOSIS — M159 Polyosteoarthritis, unspecified: Secondary | ICD-10-CM | POA: Diagnosis not present

## 2021-05-10 DIAGNOSIS — J449 Chronic obstructive pulmonary disease, unspecified: Secondary | ICD-10-CM | POA: Diagnosis not present

## 2021-05-10 DIAGNOSIS — U099 Post covid-19 condition, unspecified: Secondary | ICD-10-CM | POA: Diagnosis not present

## 2021-05-10 DIAGNOSIS — R5383 Other fatigue: Secondary | ICD-10-CM | POA: Diagnosis not present

## 2021-05-10 DIAGNOSIS — N183 Chronic kidney disease, stage 3 unspecified: Secondary | ICD-10-CM | POA: Diagnosis not present

## 2021-05-10 DIAGNOSIS — E063 Autoimmune thyroiditis: Secondary | ICD-10-CM | POA: Diagnosis not present

## 2021-05-10 DIAGNOSIS — R2681 Unsteadiness on feet: Secondary | ICD-10-CM | POA: Diagnosis not present

## 2021-05-10 DIAGNOSIS — M810 Age-related osteoporosis without current pathological fracture: Secondary | ICD-10-CM | POA: Diagnosis not present

## 2021-05-10 DIAGNOSIS — F17210 Nicotine dependence, cigarettes, uncomplicated: Secondary | ICD-10-CM | POA: Diagnosis not present

## 2021-05-12 ENCOUNTER — Ambulatory Visit: Payer: Medicare Other | Admitting: Nutrition

## 2021-05-17 DIAGNOSIS — J449 Chronic obstructive pulmonary disease, unspecified: Secondary | ICD-10-CM | POA: Diagnosis not present

## 2021-05-17 DIAGNOSIS — E782 Mixed hyperlipidemia: Secondary | ICD-10-CM | POA: Diagnosis not present

## 2021-05-18 DIAGNOSIS — N183 Chronic kidney disease, stage 3 unspecified: Secondary | ICD-10-CM | POA: Diagnosis not present

## 2021-05-18 DIAGNOSIS — M810 Age-related osteoporosis without current pathological fracture: Secondary | ICD-10-CM | POA: Diagnosis not present

## 2021-05-18 DIAGNOSIS — E063 Autoimmune thyroiditis: Secondary | ICD-10-CM | POA: Diagnosis not present

## 2021-05-18 DIAGNOSIS — R2681 Unsteadiness on feet: Secondary | ICD-10-CM | POA: Diagnosis not present

## 2021-05-18 DIAGNOSIS — N390 Urinary tract infection, site not specified: Secondary | ICD-10-CM | POA: Diagnosis not present

## 2021-05-18 DIAGNOSIS — E1122 Type 2 diabetes mellitus with diabetic chronic kidney disease: Secondary | ICD-10-CM | POA: Diagnosis not present

## 2021-05-18 DIAGNOSIS — R5383 Other fatigue: Secondary | ICD-10-CM | POA: Diagnosis not present

## 2021-05-18 DIAGNOSIS — J449 Chronic obstructive pulmonary disease, unspecified: Secondary | ICD-10-CM | POA: Diagnosis not present

## 2021-05-18 DIAGNOSIS — U099 Post covid-19 condition, unspecified: Secondary | ICD-10-CM | POA: Diagnosis not present

## 2021-05-18 DIAGNOSIS — M159 Polyosteoarthritis, unspecified: Secondary | ICD-10-CM | POA: Diagnosis not present

## 2021-05-20 DIAGNOSIS — R2681 Unsteadiness on feet: Secondary | ICD-10-CM | POA: Diagnosis not present

## 2021-05-20 DIAGNOSIS — N390 Urinary tract infection, site not specified: Secondary | ICD-10-CM | POA: Diagnosis not present

## 2021-05-20 DIAGNOSIS — E1122 Type 2 diabetes mellitus with diabetic chronic kidney disease: Secondary | ICD-10-CM | POA: Diagnosis not present

## 2021-05-20 DIAGNOSIS — E063 Autoimmune thyroiditis: Secondary | ICD-10-CM | POA: Diagnosis not present

## 2021-05-20 DIAGNOSIS — U099 Post covid-19 condition, unspecified: Secondary | ICD-10-CM | POA: Diagnosis not present

## 2021-05-20 DIAGNOSIS — N183 Chronic kidney disease, stage 3 unspecified: Secondary | ICD-10-CM | POA: Diagnosis not present

## 2021-05-20 DIAGNOSIS — J449 Chronic obstructive pulmonary disease, unspecified: Secondary | ICD-10-CM | POA: Diagnosis not present

## 2021-05-20 DIAGNOSIS — M810 Age-related osteoporosis without current pathological fracture: Secondary | ICD-10-CM | POA: Diagnosis not present

## 2021-05-20 DIAGNOSIS — M159 Polyosteoarthritis, unspecified: Secondary | ICD-10-CM | POA: Diagnosis not present

## 2021-05-20 DIAGNOSIS — R5383 Other fatigue: Secondary | ICD-10-CM | POA: Diagnosis not present

## 2021-05-21 ENCOUNTER — Encounter: Payer: Self-pay | Admitting: Internal Medicine

## 2021-05-21 ENCOUNTER — Other Ambulatory Visit: Payer: Self-pay

## 2021-05-21 ENCOUNTER — Ambulatory Visit (INDEPENDENT_AMBULATORY_CARE_PROVIDER_SITE_OTHER): Payer: Medicare Other | Admitting: Internal Medicine

## 2021-05-21 DIAGNOSIS — R5383 Other fatigue: Secondary | ICD-10-CM | POA: Diagnosis not present

## 2021-05-21 DIAGNOSIS — M810 Age-related osteoporosis without current pathological fracture: Secondary | ICD-10-CM | POA: Diagnosis not present

## 2021-05-21 DIAGNOSIS — E1122 Type 2 diabetes mellitus with diabetic chronic kidney disease: Secondary | ICD-10-CM | POA: Diagnosis not present

## 2021-05-21 DIAGNOSIS — M159 Polyosteoarthritis, unspecified: Secondary | ICD-10-CM | POA: Diagnosis not present

## 2021-05-21 DIAGNOSIS — N390 Urinary tract infection, site not specified: Secondary | ICD-10-CM | POA: Diagnosis not present

## 2021-05-21 DIAGNOSIS — R2681 Unsteadiness on feet: Secondary | ICD-10-CM | POA: Diagnosis not present

## 2021-05-21 DIAGNOSIS — F17210 Nicotine dependence, cigarettes, uncomplicated: Secondary | ICD-10-CM | POA: Diagnosis not present

## 2021-05-21 DIAGNOSIS — E063 Autoimmune thyroiditis: Secondary | ICD-10-CM | POA: Diagnosis not present

## 2021-05-21 DIAGNOSIS — N183 Chronic kidney disease, stage 3 unspecified: Secondary | ICD-10-CM | POA: Diagnosis not present

## 2021-05-21 DIAGNOSIS — U099 Post covid-19 condition, unspecified: Secondary | ICD-10-CM | POA: Diagnosis not present

## 2021-05-21 DIAGNOSIS — J449 Chronic obstructive pulmonary disease, unspecified: Secondary | ICD-10-CM | POA: Diagnosis not present

## 2021-05-21 DIAGNOSIS — J9611 Chronic respiratory failure with hypoxia: Secondary | ICD-10-CM | POA: Diagnosis not present

## 2021-05-21 NOTE — Assessment & Plan Note (Signed)
Active smoker - 05/21/2021  After extensive coaching inhaler device,  effectiveness =    75% with dpi   Pt is Group B in terms of symptom/risk and laba/lama therefore appropriate rx at this point >>>  Continue anoro and prn saba  Re SABA :  I spent extra time with pt today reviewing appropriate use of albuterol for prn use on exertion with the following points: 1) saba is for relief of sob that does not improve by walking a slower pace or resting but rather if the pt does not improve after trying this first. 2) If the pt is convinced, as many are, that saba helps recover from activity faster then it's easy to tell if this is the case by re-challenging : ie stop, take the inhaler, then p 5 minutes try the exact same activity (intensity of workload) that just caused the symptoms and see if they are substantially diminished or not after saba 3) if there is an activity that reproducibly causes the symptoms, try the saba 15 min before the activity on alternate days   If in fact the saba really does help, then fine to continue to use it prn but advised may need to look closer at the maintenance regimen being used to achieve better control of airways disease with exertion.   F/u can be yearly unless starts having aecopd in which case change to trelegy and more freq f/u

## 2021-05-21 NOTE — Assessment & Plan Note (Addendum)
4-5 min discussion re active cigarette smoking in addition to office E&M  Ask about tobacco use:   ongoing Advise quitting   I took an extended  opportunity with this patient to outline the consequences of continued cigarette use  in airway disorders based on all the data we have from the multiple national lung health studies (perfomed over decades at millions of dollars in cost)  indicating that smoking cessation, not choice of inhalers or physicians, is the most important aspect of care.   Assess willingness:  Not even interested  at this point Assist in quit attempt:  Per PCP when ready Arrange follow up:   Follow up per Primary Care planned               Each maintenance medication was reviewed in detail including emphasizing most importantly the difference between maintenance and prns and under what circumstances the prns are to be triggered using an action plan format where appropriate.  Total time for H and P, chart review, counseling, reviewing elipta/ 02  device(s) and generating customized AVS unique to this new pt  office visit / same day charting = 35 min

## 2021-05-21 NOTE — Progress Notes (Signed)
Amanda Armstrong, female    DOB: 06-19-1946,   MRN: [id number]   Brief patient profile:  57 yowf active smoker referred to pulmonary clinic in [place]  05/21/2021 by Dr  Sherwood Gambler  with baseline able to get around the house on 4 pronged walker limited     History of Present Illness  05/21/2021  Pulmonary/ 1st office eval/ [facility] / [facility]  Chief Complaint  Patient presents with   Consult    Pt saw dr. Eulis Canner. Dx with COPD. Has not seen pulmonary Dr since. COVID January 2022 since shes been on oxygen. 1.5L O2  cont.    Dyspnea:  PT coming out once a week / room to room limited / wearing 02 after ex Cough: congested cough / swallows mucus mostly  but what she does expectorate is clear [place] [facility] is am  Sleep: bed is flat with 2 pillows ok at night  SABA use: just anoro 02: 1.5 hs  and "all day "  No obvious day to day or daytime variability or assoc excess/ purulent sputum or mucus plugs or hemoptysis or cp or chest tightness, subjective wheeze or overt sinus or hb symptoms.   Sleeping  without nocturnal  or early am exacerbation  of respiratory  c/o's or need for noct saba. Also denies any obvious fluctuation of symptoms with weather or environmental changes or other aggravating or alleviating factors except as outlined above   No unusual exposure hx or h/o childhood pna/ asthma or knowledge of premature birth.  Current Allergies, Complete Past Medical History, Past Surgical History, Family History, and Social History were reviewed in [employer] record.  ROS  The following are not active complaints unless bolded Hoarseness, sore throat, dysphagia, dental problems, itching, sneezing,  nasal congestion or discharge of excess mucus or purulent secretions, ear ache,   fever, chills, sweats, unintended wt loss or wt gain, classically pleuritic or exertional cp,  orthopnea pnd or arm/hand swelling  or leg swelling, presyncope, palpitations, abdominal pain,  anorexia, nausea, vomiting, diarrhea  or change in bowel habits or change in bladder habits, change in stools or change in urine, dysuria, hematuria,  rash, arthralgias, visual complaints, headache, numbness, weakness or ataxia or problems with walking or coordination,  change in mood or  memory.           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    pt stated that she has intermittent sx   Clostridium difficile infection    Daytime somnolence    Frequent falls    Headache(784.0)    SINUS HEADACHE OCCASSIONALLY   Hypertension    Hypothyroidism    Multifactorial gait disorder    Osteoporosis    Right foot drop    Snoring    Stroke (HCC)    Tobacco use    Vertigo    Weakness     Outpatient Medications Prior to Visit Anoro one click eac am   Medication Sig Dispense Refill   ACCU-CHEK GUIDE test strip Use as instructed to test blood glucose 4 times daily. Dx Code E11.65 200 each 1   Accu-Chek Softclix Lancets lancets SMARTSIG:1 Topical 4 Times Daily     alendronate (FOSAMAX) 70 MG tablet Take 70 mg by mouth once a week.     ALPRAZolam (XANAX) 1 MG tablet  Take 1 mg by mouth at bedtime.     aspirin 81 MG EC tablet Take 81 mg by mouth in the morning.     atorvastatin (LIPITOR) 20 MG tablet Take 20 mg by mouth daily at 12 noon.     blood glucose meter kit and supplies Dispense based on patient and insurance preference. Use up to four times daily as directed. (FOR ICD-10 E10.9, E11.9). 1 each 0   Cholecalciferol (VITAMIN D3) 50 MCG (2000 UT) capsule Take 2,000 Units by mouth daily.     furosemide (LASIX) 20 MG tablet Take 20 mg by mouth daily.     HYDROcodone-acetaminophen (NORCO) 10-325 MG tablet Take 1 tablet by mouth every 4 (four) hours as needed.     insulin degludec (TRESIBA FLEXTOUCH) 100 UNIT/ML FlexTouch Pen Inject 10 Units into the skin at bedtime. 15  mL 2   Insulin Pen Needle (PEN NEEDLES 3/16") 31G X 5 MM MISC 100 each by Does not apply route 2 (two) times daily. 100 each 1   levalbuterol (XOPENEX HFA) 45 MCG/ACT inhaler Inhale 2 puffs into the lungs 2 (two) times daily. 15 g 0   levothyroxine (SYNTHROID) 25 MCG tablet Take 1 tablet (25 mcg total) by mouth daily before breakfast. 90 tablet 1   OXYGEN Inhale 1 L/min into the lungs daily.     RELION PEN NEEDLES 31G X 6 MM MISC USE 1 PEN NEEDLE TWICE DAILY     triamcinolone cream (KENALOG) 0.1 % SMARTSIG:1 Application Topical 2-3 Times Daily     No facility-administered medications prior to visit.     Objective:     BP 130/78   Pulse 74   Temp 98.4 F (36.9 C) (Temporal)   SpO2 96% Comment: ra  SpO2: 96 % (ra)  W/c bound elderly wf/ congested cough    HEENT : pt wearing mask not removed for exam due to covid - 19 concerns.    NECK :  without JVD/Nodes/TM/ nl carotid upstrokes bilaterally   LUNGS: no acc muscle use,  Mild barrel  contour chest wall with bilateral  Distant bs s audible wheeze and  without cough on insp or exp maneuvers  and mild  Hyperresonant  to  percussion bilaterally     CV:  RRR  no s3 or murmur or increase in P2, and no edema   ABD:  soft and nontender with pos end  insp Hoover's   No bruits or organomegaly appreciated, bowel sounds nl  MS:     ext warm without deformities, calf tenderness, cyanosis or clubbing No obvious joint restrictions   SKIN: warm and dry without lesions    NEURO:  alert, approp, nl sensorium with  no motor or cerebellar deficits apparent.      Cxr rec 05/21/2021 but declined due to transportation issues         Assessment   No problem-specific Assessment & Plan notes found for this encounter.     Christinia Gully, MD 05/21/2021

## 2021-05-21 NOTE — Assessment & Plan Note (Signed)
Placed on 19 Jul 2020 p covid  - sats RA at rst = 96% 05/21/2021 so rec titrate > 90% with ex   rec 1.5 lpm hs and titrate daytime   Specifically: Make sure you check your oxygen saturation  AT  your highest level of activity (not after you stop)   to be sure it stays over 90% and adjust  02 flow upward to maintain this level if needed but remember to turn it back to previous settings when you stop (to conserve your supply).

## 2021-05-21 NOTE — Patient Instructions (Addendum)
Anoro one click each am  - two good drags  02  1.5 lpm at bedtime and during the day the goal is to keep the 0xygen saturation level above 90% at all times - you may need more 02 when you walk and a lot less when you sit to achieve that level.  The key is to stop smoking completely before smoking completely stops you!   Please schedule a follow up visit in 12 months but call sooner if needed

## 2021-05-24 DIAGNOSIS — E063 Autoimmune thyroiditis: Secondary | ICD-10-CM | POA: Diagnosis not present

## 2021-05-24 DIAGNOSIS — N390 Urinary tract infection, site not specified: Secondary | ICD-10-CM | POA: Diagnosis not present

## 2021-05-24 DIAGNOSIS — M810 Age-related osteoporosis without current pathological fracture: Secondary | ICD-10-CM | POA: Diagnosis not present

## 2021-05-24 DIAGNOSIS — E1122 Type 2 diabetes mellitus with diabetic chronic kidney disease: Secondary | ICD-10-CM | POA: Diagnosis not present

## 2021-05-24 DIAGNOSIS — N183 Chronic kidney disease, stage 3 unspecified: Secondary | ICD-10-CM | POA: Diagnosis not present

## 2021-05-24 DIAGNOSIS — M159 Polyosteoarthritis, unspecified: Secondary | ICD-10-CM | POA: Diagnosis not present

## 2021-05-24 DIAGNOSIS — R5383 Other fatigue: Secondary | ICD-10-CM | POA: Diagnosis not present

## 2021-05-24 DIAGNOSIS — R2681 Unsteadiness on feet: Secondary | ICD-10-CM | POA: Diagnosis not present

## 2021-05-24 DIAGNOSIS — J449 Chronic obstructive pulmonary disease, unspecified: Secondary | ICD-10-CM | POA: Diagnosis not present

## 2021-05-24 DIAGNOSIS — U099 Post covid-19 condition, unspecified: Secondary | ICD-10-CM | POA: Diagnosis not present

## 2021-05-26 DIAGNOSIS — E063 Autoimmune thyroiditis: Secondary | ICD-10-CM | POA: Diagnosis not present

## 2021-05-26 DIAGNOSIS — R5383 Other fatigue: Secondary | ICD-10-CM | POA: Diagnosis not present

## 2021-05-26 DIAGNOSIS — J449 Chronic obstructive pulmonary disease, unspecified: Secondary | ICD-10-CM | POA: Diagnosis not present

## 2021-05-26 DIAGNOSIS — U099 Post covid-19 condition, unspecified: Secondary | ICD-10-CM | POA: Diagnosis not present

## 2021-05-26 DIAGNOSIS — M159 Polyosteoarthritis, unspecified: Secondary | ICD-10-CM | POA: Diagnosis not present

## 2021-05-26 DIAGNOSIS — R2681 Unsteadiness on feet: Secondary | ICD-10-CM | POA: Diagnosis not present

## 2021-05-26 DIAGNOSIS — N390 Urinary tract infection, site not specified: Secondary | ICD-10-CM | POA: Diagnosis not present

## 2021-05-26 DIAGNOSIS — N183 Chronic kidney disease, stage 3 unspecified: Secondary | ICD-10-CM | POA: Diagnosis not present

## 2021-05-26 DIAGNOSIS — M810 Age-related osteoporosis without current pathological fracture: Secondary | ICD-10-CM | POA: Diagnosis not present

## 2021-05-26 DIAGNOSIS — E1122 Type 2 diabetes mellitus with diabetic chronic kidney disease: Secondary | ICD-10-CM | POA: Diagnosis not present

## 2021-05-27 DIAGNOSIS — R2681 Unsteadiness on feet: Secondary | ICD-10-CM | POA: Diagnosis not present

## 2021-05-27 DIAGNOSIS — E1122 Type 2 diabetes mellitus with diabetic chronic kidney disease: Secondary | ICD-10-CM | POA: Diagnosis not present

## 2021-05-27 DIAGNOSIS — M159 Polyosteoarthritis, unspecified: Secondary | ICD-10-CM | POA: Diagnosis not present

## 2021-05-27 DIAGNOSIS — N390 Urinary tract infection, site not specified: Secondary | ICD-10-CM | POA: Diagnosis not present

## 2021-05-27 DIAGNOSIS — N183 Chronic kidney disease, stage 3 unspecified: Secondary | ICD-10-CM | POA: Diagnosis not present

## 2021-05-27 DIAGNOSIS — R5383 Other fatigue: Secondary | ICD-10-CM | POA: Diagnosis not present

## 2021-05-27 DIAGNOSIS — U099 Post covid-19 condition, unspecified: Secondary | ICD-10-CM | POA: Diagnosis not present

## 2021-05-27 DIAGNOSIS — J449 Chronic obstructive pulmonary disease, unspecified: Secondary | ICD-10-CM | POA: Diagnosis not present

## 2021-05-27 DIAGNOSIS — E063 Autoimmune thyroiditis: Secondary | ICD-10-CM | POA: Diagnosis not present

## 2021-05-27 DIAGNOSIS — M810 Age-related osteoporosis without current pathological fracture: Secondary | ICD-10-CM | POA: Diagnosis not present

## 2021-05-31 DIAGNOSIS — E1122 Type 2 diabetes mellitus with diabetic chronic kidney disease: Secondary | ICD-10-CM | POA: Diagnosis not present

## 2021-05-31 DIAGNOSIS — J449 Chronic obstructive pulmonary disease, unspecified: Secondary | ICD-10-CM | POA: Diagnosis not present

## 2021-05-31 DIAGNOSIS — M159 Polyosteoarthritis, unspecified: Secondary | ICD-10-CM | POA: Diagnosis not present

## 2021-05-31 DIAGNOSIS — U071 COVID-19: Secondary | ICD-10-CM | POA: Diagnosis not present

## 2021-05-31 DIAGNOSIS — R5383 Other fatigue: Secondary | ICD-10-CM | POA: Diagnosis not present

## 2021-05-31 DIAGNOSIS — U099 Post covid-19 condition, unspecified: Secondary | ICD-10-CM | POA: Diagnosis not present

## 2021-05-31 DIAGNOSIS — J9601 Acute respiratory failure with hypoxia: Secondary | ICD-10-CM | POA: Diagnosis not present

## 2021-05-31 DIAGNOSIS — E063 Autoimmune thyroiditis: Secondary | ICD-10-CM | POA: Diagnosis not present

## 2021-05-31 DIAGNOSIS — N390 Urinary tract infection, site not specified: Secondary | ICD-10-CM | POA: Diagnosis not present

## 2021-05-31 DIAGNOSIS — N183 Chronic kidney disease, stage 3 unspecified: Secondary | ICD-10-CM | POA: Diagnosis not present

## 2021-05-31 DIAGNOSIS — M810 Age-related osteoporosis without current pathological fracture: Secondary | ICD-10-CM | POA: Diagnosis not present

## 2021-05-31 DIAGNOSIS — R2681 Unsteadiness on feet: Secondary | ICD-10-CM | POA: Diagnosis not present

## 2021-06-01 DIAGNOSIS — J449 Chronic obstructive pulmonary disease, unspecified: Secondary | ICD-10-CM | POA: Diagnosis not present

## 2021-06-01 DIAGNOSIS — J9601 Acute respiratory failure with hypoxia: Secondary | ICD-10-CM | POA: Diagnosis not present

## 2021-06-01 DIAGNOSIS — U071 COVID-19: Secondary | ICD-10-CM | POA: Diagnosis not present

## 2021-06-02 DIAGNOSIS — E1122 Type 2 diabetes mellitus with diabetic chronic kidney disease: Secondary | ICD-10-CM | POA: Diagnosis not present

## 2021-06-02 DIAGNOSIS — M159 Polyosteoarthritis, unspecified: Secondary | ICD-10-CM | POA: Diagnosis not present

## 2021-06-02 DIAGNOSIS — N390 Urinary tract infection, site not specified: Secondary | ICD-10-CM | POA: Diagnosis not present

## 2021-06-02 DIAGNOSIS — U099 Post covid-19 condition, unspecified: Secondary | ICD-10-CM | POA: Diagnosis not present

## 2021-06-02 DIAGNOSIS — M810 Age-related osteoporosis without current pathological fracture: Secondary | ICD-10-CM | POA: Diagnosis not present

## 2021-06-02 DIAGNOSIS — R5383 Other fatigue: Secondary | ICD-10-CM | POA: Diagnosis not present

## 2021-06-02 DIAGNOSIS — J9601 Acute respiratory failure with hypoxia: Secondary | ICD-10-CM | POA: Diagnosis not present

## 2021-06-02 DIAGNOSIS — J449 Chronic obstructive pulmonary disease, unspecified: Secondary | ICD-10-CM | POA: Diagnosis not present

## 2021-06-02 DIAGNOSIS — R2681 Unsteadiness on feet: Secondary | ICD-10-CM | POA: Diagnosis not present

## 2021-06-02 DIAGNOSIS — N183 Chronic kidney disease, stage 3 unspecified: Secondary | ICD-10-CM | POA: Diagnosis not present

## 2021-06-02 DIAGNOSIS — E063 Autoimmune thyroiditis: Secondary | ICD-10-CM | POA: Diagnosis not present

## 2021-06-03 DIAGNOSIS — R2681 Unsteadiness on feet: Secondary | ICD-10-CM | POA: Diagnosis not present

## 2021-06-03 DIAGNOSIS — E1122 Type 2 diabetes mellitus with diabetic chronic kidney disease: Secondary | ICD-10-CM | POA: Diagnosis not present

## 2021-06-03 DIAGNOSIS — M810 Age-related osteoporosis without current pathological fracture: Secondary | ICD-10-CM | POA: Diagnosis not present

## 2021-06-03 DIAGNOSIS — N183 Chronic kidney disease, stage 3 unspecified: Secondary | ICD-10-CM | POA: Diagnosis not present

## 2021-06-03 DIAGNOSIS — N390 Urinary tract infection, site not specified: Secondary | ICD-10-CM | POA: Diagnosis not present

## 2021-06-03 DIAGNOSIS — R5383 Other fatigue: Secondary | ICD-10-CM | POA: Diagnosis not present

## 2021-06-03 DIAGNOSIS — U099 Post covid-19 condition, unspecified: Secondary | ICD-10-CM | POA: Diagnosis not present

## 2021-06-03 DIAGNOSIS — J449 Chronic obstructive pulmonary disease, unspecified: Secondary | ICD-10-CM | POA: Diagnosis not present

## 2021-06-03 DIAGNOSIS — M159 Polyosteoarthritis, unspecified: Secondary | ICD-10-CM | POA: Diagnosis not present

## 2021-06-03 DIAGNOSIS — E063 Autoimmune thyroiditis: Secondary | ICD-10-CM | POA: Diagnosis not present

## 2021-06-04 DIAGNOSIS — E1122 Type 2 diabetes mellitus with diabetic chronic kidney disease: Secondary | ICD-10-CM | POA: Diagnosis not present

## 2021-06-04 DIAGNOSIS — E063 Autoimmune thyroiditis: Secondary | ICD-10-CM | POA: Diagnosis not present

## 2021-06-04 DIAGNOSIS — J449 Chronic obstructive pulmonary disease, unspecified: Secondary | ICD-10-CM | POA: Diagnosis not present

## 2021-06-04 DIAGNOSIS — M159 Polyosteoarthritis, unspecified: Secondary | ICD-10-CM | POA: Diagnosis not present

## 2021-06-04 DIAGNOSIS — N390 Urinary tract infection, site not specified: Secondary | ICD-10-CM | POA: Diagnosis not present

## 2021-06-04 DIAGNOSIS — R2681 Unsteadiness on feet: Secondary | ICD-10-CM | POA: Diagnosis not present

## 2021-06-04 DIAGNOSIS — M810 Age-related osteoporosis without current pathological fracture: Secondary | ICD-10-CM | POA: Diagnosis not present

## 2021-06-04 DIAGNOSIS — U099 Post covid-19 condition, unspecified: Secondary | ICD-10-CM | POA: Diagnosis not present

## 2021-06-04 DIAGNOSIS — N183 Chronic kidney disease, stage 3 unspecified: Secondary | ICD-10-CM | POA: Diagnosis not present

## 2021-06-04 DIAGNOSIS — R5383 Other fatigue: Secondary | ICD-10-CM | POA: Diagnosis not present

## 2021-06-07 DIAGNOSIS — M810 Age-related osteoporosis without current pathological fracture: Secondary | ICD-10-CM | POA: Diagnosis not present

## 2021-06-07 DIAGNOSIS — M159 Polyosteoarthritis, unspecified: Secondary | ICD-10-CM | POA: Diagnosis not present

## 2021-06-07 DIAGNOSIS — E1122 Type 2 diabetes mellitus with diabetic chronic kidney disease: Secondary | ICD-10-CM | POA: Diagnosis not present

## 2021-06-07 DIAGNOSIS — R2681 Unsteadiness on feet: Secondary | ICD-10-CM | POA: Diagnosis not present

## 2021-06-07 DIAGNOSIS — R5383 Other fatigue: Secondary | ICD-10-CM | POA: Diagnosis not present

## 2021-06-07 DIAGNOSIS — U099 Post covid-19 condition, unspecified: Secondary | ICD-10-CM | POA: Diagnosis not present

## 2021-06-07 DIAGNOSIS — J449 Chronic obstructive pulmonary disease, unspecified: Secondary | ICD-10-CM | POA: Diagnosis not present

## 2021-06-07 DIAGNOSIS — N390 Urinary tract infection, site not specified: Secondary | ICD-10-CM | POA: Diagnosis not present

## 2021-06-07 DIAGNOSIS — E063 Autoimmune thyroiditis: Secondary | ICD-10-CM | POA: Diagnosis not present

## 2021-06-07 DIAGNOSIS — N183 Chronic kidney disease, stage 3 unspecified: Secondary | ICD-10-CM | POA: Diagnosis not present

## 2021-06-07 DIAGNOSIS — M1991 Primary osteoarthritis, unspecified site: Secondary | ICD-10-CM | POA: Diagnosis not present

## 2021-06-08 DIAGNOSIS — R2681 Unsteadiness on feet: Secondary | ICD-10-CM | POA: Diagnosis not present

## 2021-06-08 DIAGNOSIS — E1122 Type 2 diabetes mellitus with diabetic chronic kidney disease: Secondary | ICD-10-CM | POA: Diagnosis not present

## 2021-06-08 DIAGNOSIS — E063 Autoimmune thyroiditis: Secondary | ICD-10-CM | POA: Diagnosis not present

## 2021-06-08 DIAGNOSIS — M159 Polyosteoarthritis, unspecified: Secondary | ICD-10-CM | POA: Diagnosis not present

## 2021-06-08 DIAGNOSIS — N183 Chronic kidney disease, stage 3 unspecified: Secondary | ICD-10-CM | POA: Diagnosis not present

## 2021-06-08 DIAGNOSIS — M810 Age-related osteoporosis without current pathological fracture: Secondary | ICD-10-CM | POA: Diagnosis not present

## 2021-06-08 DIAGNOSIS — U099 Post covid-19 condition, unspecified: Secondary | ICD-10-CM | POA: Diagnosis not present

## 2021-06-08 DIAGNOSIS — N390 Urinary tract infection, site not specified: Secondary | ICD-10-CM | POA: Diagnosis not present

## 2021-06-08 DIAGNOSIS — J449 Chronic obstructive pulmonary disease, unspecified: Secondary | ICD-10-CM | POA: Diagnosis not present

## 2021-06-08 DIAGNOSIS — R5383 Other fatigue: Secondary | ICD-10-CM | POA: Diagnosis not present

## 2021-06-11 DIAGNOSIS — M810 Age-related osteoporosis without current pathological fracture: Secondary | ICD-10-CM | POA: Diagnosis not present

## 2021-06-11 DIAGNOSIS — R2681 Unsteadiness on feet: Secondary | ICD-10-CM | POA: Diagnosis not present

## 2021-06-11 DIAGNOSIS — R5383 Other fatigue: Secondary | ICD-10-CM | POA: Diagnosis not present

## 2021-06-11 DIAGNOSIS — J449 Chronic obstructive pulmonary disease, unspecified: Secondary | ICD-10-CM | POA: Diagnosis not present

## 2021-06-11 DIAGNOSIS — N183 Chronic kidney disease, stage 3 unspecified: Secondary | ICD-10-CM | POA: Diagnosis not present

## 2021-06-11 DIAGNOSIS — E063 Autoimmune thyroiditis: Secondary | ICD-10-CM | POA: Diagnosis not present

## 2021-06-11 DIAGNOSIS — N390 Urinary tract infection, site not specified: Secondary | ICD-10-CM | POA: Diagnosis not present

## 2021-06-11 DIAGNOSIS — M159 Polyosteoarthritis, unspecified: Secondary | ICD-10-CM | POA: Diagnosis not present

## 2021-06-11 DIAGNOSIS — U099 Post covid-19 condition, unspecified: Secondary | ICD-10-CM | POA: Diagnosis not present

## 2021-06-11 DIAGNOSIS — E1122 Type 2 diabetes mellitus with diabetic chronic kidney disease: Secondary | ICD-10-CM | POA: Diagnosis not present

## 2021-06-15 ENCOUNTER — Encounter: Payer: Medicare Other | Attending: Internal Medicine | Admitting: Nutrition

## 2021-06-15 ENCOUNTER — Other Ambulatory Visit: Payer: Self-pay

## 2021-06-16 DIAGNOSIS — R2681 Unsteadiness on feet: Secondary | ICD-10-CM | POA: Diagnosis not present

## 2021-06-16 DIAGNOSIS — R5383 Other fatigue: Secondary | ICD-10-CM | POA: Diagnosis not present

## 2021-06-16 DIAGNOSIS — E782 Mixed hyperlipidemia: Secondary | ICD-10-CM | POA: Diagnosis not present

## 2021-06-16 DIAGNOSIS — E063 Autoimmune thyroiditis: Secondary | ICD-10-CM | POA: Diagnosis not present

## 2021-06-16 DIAGNOSIS — U099 Post covid-19 condition, unspecified: Secondary | ICD-10-CM | POA: Diagnosis not present

## 2021-06-16 DIAGNOSIS — M810 Age-related osteoporosis without current pathological fracture: Secondary | ICD-10-CM | POA: Diagnosis not present

## 2021-06-16 DIAGNOSIS — N183 Chronic kidney disease, stage 3 unspecified: Secondary | ICD-10-CM | POA: Diagnosis not present

## 2021-06-16 DIAGNOSIS — E1122 Type 2 diabetes mellitus with diabetic chronic kidney disease: Secondary | ICD-10-CM | POA: Diagnosis not present

## 2021-06-16 DIAGNOSIS — J449 Chronic obstructive pulmonary disease, unspecified: Secondary | ICD-10-CM | POA: Diagnosis not present

## 2021-06-16 DIAGNOSIS — M159 Polyosteoarthritis, unspecified: Secondary | ICD-10-CM | POA: Diagnosis not present

## 2021-06-16 DIAGNOSIS — N390 Urinary tract infection, site not specified: Secondary | ICD-10-CM | POA: Diagnosis not present

## 2021-06-22 DIAGNOSIS — R2681 Unsteadiness on feet: Secondary | ICD-10-CM | POA: Diagnosis not present

## 2021-06-22 DIAGNOSIS — E063 Autoimmune thyroiditis: Secondary | ICD-10-CM | POA: Diagnosis not present

## 2021-06-22 DIAGNOSIS — N183 Chronic kidney disease, stage 3 unspecified: Secondary | ICD-10-CM | POA: Diagnosis not present

## 2021-06-22 DIAGNOSIS — M159 Polyosteoarthritis, unspecified: Secondary | ICD-10-CM | POA: Diagnosis not present

## 2021-06-22 DIAGNOSIS — J449 Chronic obstructive pulmonary disease, unspecified: Secondary | ICD-10-CM | POA: Diagnosis not present

## 2021-06-22 DIAGNOSIS — U099 Post covid-19 condition, unspecified: Secondary | ICD-10-CM | POA: Diagnosis not present

## 2021-06-22 DIAGNOSIS — M810 Age-related osteoporosis without current pathological fracture: Secondary | ICD-10-CM | POA: Diagnosis not present

## 2021-06-22 DIAGNOSIS — R5383 Other fatigue: Secondary | ICD-10-CM | POA: Diagnosis not present

## 2021-06-22 DIAGNOSIS — N390 Urinary tract infection, site not specified: Secondary | ICD-10-CM | POA: Diagnosis not present

## 2021-06-22 DIAGNOSIS — E1122 Type 2 diabetes mellitus with diabetic chronic kidney disease: Secondary | ICD-10-CM | POA: Diagnosis not present

## 2021-06-29 DIAGNOSIS — M810 Age-related osteoporosis without current pathological fracture: Secondary | ICD-10-CM | POA: Diagnosis not present

## 2021-06-29 DIAGNOSIS — N183 Chronic kidney disease, stage 3 unspecified: Secondary | ICD-10-CM | POA: Diagnosis not present

## 2021-06-29 DIAGNOSIS — N390 Urinary tract infection, site not specified: Secondary | ICD-10-CM | POA: Diagnosis not present

## 2021-06-29 DIAGNOSIS — E1122 Type 2 diabetes mellitus with diabetic chronic kidney disease: Secondary | ICD-10-CM | POA: Diagnosis not present

## 2021-06-29 DIAGNOSIS — M159 Polyosteoarthritis, unspecified: Secondary | ICD-10-CM | POA: Diagnosis not present

## 2021-06-29 DIAGNOSIS — R2681 Unsteadiness on feet: Secondary | ICD-10-CM | POA: Diagnosis not present

## 2021-06-29 DIAGNOSIS — U099 Post covid-19 condition, unspecified: Secondary | ICD-10-CM | POA: Diagnosis not present

## 2021-06-29 DIAGNOSIS — R5383 Other fatigue: Secondary | ICD-10-CM | POA: Diagnosis not present

## 2021-06-29 DIAGNOSIS — J449 Chronic obstructive pulmonary disease, unspecified: Secondary | ICD-10-CM | POA: Diagnosis not present

## 2021-06-29 DIAGNOSIS — E063 Autoimmune thyroiditis: Secondary | ICD-10-CM | POA: Diagnosis not present

## 2021-06-30 DIAGNOSIS — J9601 Acute respiratory failure with hypoxia: Secondary | ICD-10-CM | POA: Diagnosis not present

## 2021-06-30 DIAGNOSIS — U071 COVID-19: Secondary | ICD-10-CM | POA: Diagnosis not present

## 2021-06-30 DIAGNOSIS — J449 Chronic obstructive pulmonary disease, unspecified: Secondary | ICD-10-CM | POA: Diagnosis not present

## 2021-07-01 DIAGNOSIS — J449 Chronic obstructive pulmonary disease, unspecified: Secondary | ICD-10-CM | POA: Diagnosis not present

## 2021-07-01 DIAGNOSIS — U071 COVID-19: Secondary | ICD-10-CM | POA: Diagnosis not present

## 2021-07-01 DIAGNOSIS — J9601 Acute respiratory failure with hypoxia: Secondary | ICD-10-CM | POA: Diagnosis not present

## 2021-07-02 DIAGNOSIS — E063 Autoimmune thyroiditis: Secondary | ICD-10-CM | POA: Diagnosis not present

## 2021-07-02 DIAGNOSIS — G894 Chronic pain syndrome: Secondary | ICD-10-CM | POA: Diagnosis not present

## 2021-07-02 DIAGNOSIS — M1991 Primary osteoarthritis, unspecified site: Secondary | ICD-10-CM | POA: Diagnosis not present

## 2021-07-06 DIAGNOSIS — U099 Post covid-19 condition, unspecified: Secondary | ICD-10-CM | POA: Diagnosis not present

## 2021-07-06 DIAGNOSIS — R5383 Other fatigue: Secondary | ICD-10-CM | POA: Diagnosis not present

## 2021-07-06 DIAGNOSIS — E063 Autoimmune thyroiditis: Secondary | ICD-10-CM | POA: Diagnosis not present

## 2021-07-06 DIAGNOSIS — N390 Urinary tract infection, site not specified: Secondary | ICD-10-CM | POA: Diagnosis not present

## 2021-07-06 DIAGNOSIS — M810 Age-related osteoporosis without current pathological fracture: Secondary | ICD-10-CM | POA: Diagnosis not present

## 2021-07-06 DIAGNOSIS — N183 Chronic kidney disease, stage 3 unspecified: Secondary | ICD-10-CM | POA: Diagnosis not present

## 2021-07-06 DIAGNOSIS — J449 Chronic obstructive pulmonary disease, unspecified: Secondary | ICD-10-CM | POA: Diagnosis not present

## 2021-07-06 DIAGNOSIS — R2681 Unsteadiness on feet: Secondary | ICD-10-CM | POA: Diagnosis not present

## 2021-07-06 DIAGNOSIS — E1122 Type 2 diabetes mellitus with diabetic chronic kidney disease: Secondary | ICD-10-CM | POA: Diagnosis not present

## 2021-07-06 DIAGNOSIS — M159 Polyosteoarthritis, unspecified: Secondary | ICD-10-CM | POA: Diagnosis not present

## 2021-07-13 DIAGNOSIS — J449 Chronic obstructive pulmonary disease, unspecified: Secondary | ICD-10-CM | POA: Diagnosis not present

## 2021-07-13 DIAGNOSIS — M810 Age-related osteoporosis without current pathological fracture: Secondary | ICD-10-CM | POA: Diagnosis not present

## 2021-07-13 DIAGNOSIS — U099 Post covid-19 condition, unspecified: Secondary | ICD-10-CM | POA: Diagnosis not present

## 2021-07-13 DIAGNOSIS — E063 Autoimmune thyroiditis: Secondary | ICD-10-CM | POA: Diagnosis not present

## 2021-07-13 DIAGNOSIS — N183 Chronic kidney disease, stage 3 unspecified: Secondary | ICD-10-CM | POA: Diagnosis not present

## 2021-07-13 DIAGNOSIS — R2681 Unsteadiness on feet: Secondary | ICD-10-CM | POA: Diagnosis not present

## 2021-07-13 DIAGNOSIS — N390 Urinary tract infection, site not specified: Secondary | ICD-10-CM | POA: Diagnosis not present

## 2021-07-13 DIAGNOSIS — M159 Polyosteoarthritis, unspecified: Secondary | ICD-10-CM | POA: Diagnosis not present

## 2021-07-13 DIAGNOSIS — R5383 Other fatigue: Secondary | ICD-10-CM | POA: Diagnosis not present

## 2021-07-13 DIAGNOSIS — E1122 Type 2 diabetes mellitus with diabetic chronic kidney disease: Secondary | ICD-10-CM | POA: Diagnosis not present

## 2021-07-15 DIAGNOSIS — Z87440 Personal history of urinary (tract) infections: Secondary | ICD-10-CM | POA: Diagnosis not present

## 2021-07-15 DIAGNOSIS — R2681 Unsteadiness on feet: Secondary | ICD-10-CM | POA: Diagnosis not present

## 2021-07-15 DIAGNOSIS — U099 Post covid-19 condition, unspecified: Secondary | ICD-10-CM | POA: Diagnosis not present

## 2021-07-15 DIAGNOSIS — J449 Chronic obstructive pulmonary disease, unspecified: Secondary | ICD-10-CM | POA: Diagnosis not present

## 2021-07-15 DIAGNOSIS — R5383 Other fatigue: Secondary | ICD-10-CM | POA: Diagnosis not present

## 2021-07-15 DIAGNOSIS — M159 Polyosteoarthritis, unspecified: Secondary | ICD-10-CM | POA: Diagnosis not present

## 2021-07-15 DIAGNOSIS — M810 Age-related osteoporosis without current pathological fracture: Secondary | ICD-10-CM | POA: Diagnosis not present

## 2021-07-15 DIAGNOSIS — E1122 Type 2 diabetes mellitus with diabetic chronic kidney disease: Secondary | ICD-10-CM | POA: Diagnosis not present

## 2021-07-15 DIAGNOSIS — E063 Autoimmune thyroiditis: Secondary | ICD-10-CM | POA: Diagnosis not present

## 2021-07-15 DIAGNOSIS — N183 Chronic kidney disease, stage 3 unspecified: Secondary | ICD-10-CM | POA: Diagnosis not present

## 2021-07-16 DIAGNOSIS — J449 Chronic obstructive pulmonary disease, unspecified: Secondary | ICD-10-CM | POA: Diagnosis not present

## 2021-07-16 DIAGNOSIS — E782 Mixed hyperlipidemia: Secondary | ICD-10-CM | POA: Diagnosis not present

## 2021-07-16 DIAGNOSIS — E038 Other specified hypothyroidism: Secondary | ICD-10-CM | POA: Diagnosis not present

## 2021-07-16 DIAGNOSIS — E063 Autoimmune thyroiditis: Secondary | ICD-10-CM | POA: Diagnosis not present

## 2021-07-17 LAB — TSH: TSH: 5.58 u[IU]/mL — ABNORMAL HIGH (ref 0.450–4.500)

## 2021-07-17 LAB — T4, FREE: Free T4: 1.45 ng/dL (ref 0.82–1.77)

## 2021-07-21 ENCOUNTER — Encounter: Payer: Self-pay | Admitting: Nurse Practitioner

## 2021-07-21 ENCOUNTER — Ambulatory Visit (INDEPENDENT_AMBULATORY_CARE_PROVIDER_SITE_OTHER): Payer: Commercial Managed Care - HMO | Admitting: Nurse Practitioner

## 2021-07-21 ENCOUNTER — Other Ambulatory Visit: Payer: Self-pay

## 2021-07-21 VITALS — BP 102/64 | HR 74

## 2021-07-21 DIAGNOSIS — E063 Autoimmune thyroiditis: Secondary | ICD-10-CM

## 2021-07-21 DIAGNOSIS — E038 Other specified hypothyroidism: Secondary | ICD-10-CM

## 2021-07-21 DIAGNOSIS — E782 Mixed hyperlipidemia: Secondary | ICD-10-CM | POA: Diagnosis not present

## 2021-07-21 DIAGNOSIS — E1165 Type 2 diabetes mellitus with hyperglycemia: Secondary | ICD-10-CM

## 2021-07-21 DIAGNOSIS — E039 Hypothyroidism, unspecified: Secondary | ICD-10-CM

## 2021-07-21 LAB — POCT GLYCOSYLATED HEMOGLOBIN (HGB A1C): HbA1c, POC (controlled diabetic range): 5.7 % (ref 0.0–7.0)

## 2021-07-21 MED ORDER — TRESIBA FLEXTOUCH 100 UNIT/ML ~~LOC~~ SOPN: 6.0000 [IU] | PEN_INJECTOR | Freq: Every day | SUBCUTANEOUS | 2 refills | Status: DC

## 2021-07-21 NOTE — Progress Notes (Signed)
Endocrinology Follow Up Note       07/21/2021, 10:49 AM   Subjective:    Patient ID: Amanda Armstrong, female    DOB: [birth year].  Amanda Armstrong is being seen in follow up after being seen in consultation for management of currently uncontrolled symptomatic diabetes requested by  Redmond School, MD.   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    pt stated that she has intermittent sx   Clostridium difficile infection    Daytime somnolence    Frequent falls    Headache(784.0)    SINUS HEADACHE OCCASSIONALLY   Hypertension    Hypothyroidism    Multifactorial gait disorder    Osteoporosis    Right foot drop    Snoring    Stroke ([place])    Tobacco use    Vertigo    Weakness     Past Surgical History:  Procedure Laterality Date   CATARACT EXTRACTION W/PHACO Right 06/28/2016   Procedure: CATARACT EXTRACTION PHACO AND INTRAOCULAR LENS PLACEMENT ([place]);  Surgeon: Rutherford Guys, MD;  Location: [facility];  Service: Ophthalmology;  Laterality: Right;  CDE: 11.10   CATARACT EXTRACTION W/PHACO Left 07/26/2016   Procedure: CATARACT EXTRACTION PHACO AND INTRAOCULAR LENS PLACEMENT ([facility]);  Surgeon: Rutherford Guys, MD;  Location: [facility];  Service: Ophthalmology;  Laterality: Left;  CDE: 8.88   JOINT REPLACEMENT  11/08/3010   right total knee   TOTAL KNEE ARTHROPLASTY  11/07/2011   Procedure: TOTAL KNEE ARTHROPLASTY;  Surgeon: Lorn Junes, MD;  Location: [facility];  Service: Orthopedics;  Laterality: Left;  DR Ephriam Knuckles 34 MINUTES FOR THIS CASE   TOTAL KNEE ARTHROPLASTY Right 2011   TUBAL LIGATION  1982    Social History   Socioeconomic History   Marital status: Married    Spouse name: Jeneen Rinks   Number of children: 4   Years of education: [employer]   Highest education level: Not on file   Occupational History   Not on file  Tobacco Use   Smoking status: Every Day    Packs/day: 1.00    Years: 48.00    Pack years: 48.00    Types: Cigarettes   Smokeless tobacco: Never   Tobacco comments:    12/29/14 1/2 PPD  Vaping Use   Vaping Use: Never used  Substance and Sexual Activity   Alcohol use: No    Alcohol/week: 0.0 standard drinks   Drug use: No   Sexual activity: Never    Birth control/protection: Surgical  Other Topics Concern   Not on file  Social History Narrative   1 cup of coffee a day    Social Determinants of Health   Financial Resource Strain: Not on file  Food Insecurity: Not on file  Transportation Needs: Not on file  Physical Activity: Not on file  Stress: Not on file  Social Connections: Not on file    Family History  Problem Relation Age of Onset   Heart attack Mother    COPD Father    Arthritis Sister    Heart disease Brother     Outpatient Encounter Medications as of 07/21/2021  Medication Sig   ACCU-CHEK GUIDE test strip Use as instructed to test blood glucose 4 times daily. Dx Code E11.65   Accu-Chek Softclix Lancets lancets SMARTSIG:1 Topical 4 Times Daily   alendronate (FOSAMAX) 70 MG tablet Take 70 mg by mouth once a week.   ALPRAZolam (XANAX) 1 MG tablet Take 1 mg by mouth at bedtime.   ANORO ELLIPTA 62.5-25 MCG/ACT AEPB Inhale 1 puff into the lungs daily.   aspirin 81 MG EC tablet Take 81 mg by mouth in the morning.   atorvastatin (LIPITOR) 20 MG tablet Take 20 mg by mouth daily at 12 noon.   blood glucose meter kit and supplies Dispense based on patient and insurance preference. Use up to four times daily as directed. (FOR ICD-10 E10.9, E11.9).   Cholecalciferol (VITAMIN D3) 50 MCG (2000 UT) capsule Take 2,000 Units by mouth daily.   furosemide (LASIX) 20 MG tablet Take 20 mg by mouth daily.   HYDROcodone-acetaminophen (NORCO) 10-325 MG tablet Take 1 tablet by mouth every 4 (four) hours as needed.   Insulin Pen Needle (PEN  NEEDLES 3/16") 31G X 5 MM MISC 100 each by Does not apply route 2 (two) times daily.   levalbuterol (XOPENEX HFA) 45 MCG/ACT inhaler Inhale 2 puffs into the lungs 2 (two) times daily.   levothyroxine (SYNTHROID) 75 MCG tablet Take 75 mcg by mouth daily before breakfast.   OXYGEN Inhale 1 L/min into the lungs daily.   RELION PEN NEEDLES 31G X 6 MM MISC USE 1 PEN NEEDLE TWICE DAILY   sulfamethoxazole-trimethoprim (BACTRIM) 400-80 MG tablet Take 1 tablet by mouth at bedtime.   triamcinolone cream (KENALOG) 0.1 % SMARTSIG:1 Application Topical 2-3 Times Daily   [DISCONTINUED] insulin degludec (TRESIBA FLEXTOUCH) 100 UNIT/ML FlexTouch Pen Inject 10 Units into the skin at bedtime.   [DISCONTINUED] levothyroxine (SYNTHROID) 25 MCG tablet Take 1 tablet (25 mcg total) by mouth daily before breakfast.   insulin degludec (TRESIBA FLEXTOUCH) 100 UNIT/ML FlexTouch Pen Inject 6 Units into the skin at bedtime.   [DISCONTINUED] levothyroxine (SYNTHROID) 75 MCG tablet Take 75 mcg by mouth every morning. (Patient not taking: Reported on 07/21/2021)   No facility-administered encounter medications on file as of 07/21/2021.    ALLERGIES: Allergies  Allergen Reactions   Iron Other (See Comments)    "upset stomach"   Oxycodone Other (See Comments)    "CX HER TO FELL LIKE SHE IS OUT OF HER BODY"   Sudafed [Pseudoephedrine Hcl] Other (See Comments)    "funny feeling"    VACCINATION STATUS:  There is no immunization history on file for this patient.  Diabetes She presents for her follow-up diabetic visit. She has type 2 diabetes mellitus. Her disease course has been improving. Hypoglycemia symptoms include sweats and tremors. There are no diabetic associated symptoms. Pertinent negatives for diabetes include no fatigue. There are no hypoglycemic complications. Symptoms are improving. Diabetic complications include nephropathy. Risk factors for coronary artery disease include diabetes mellitus, dyslipidemia,  obesity, sedentary lifestyle, family history, tobacco exposure and post-menopausal. Current diabetic treatment includes insulin injections. She is compliant with treatment most of the time. Her weight is fluctuating minimally. She is following a generally unhealthy diet. When asked about meal planning, she reported none. She has not had a previous visit with a  dietitian. She never (unable to engage in routine physical activity due to mobility deficits) participates in exercise. Her home blood glucose trend is decreasing steadily. Her breakfast blood glucose range is generally 90-110 mg/dl. Her bedtime blood glucose range is generally 110-130 mg/dl. (She presents today, accompanied by her family, with her meter and logs showing tight fasting and at goal postprandial glycemic profile.  Her POCT A1c today is 5.7%, improving drastically from last visit of 10.8%.  She does have some mild random hypoglycemia noted, usually due to meal timing.) An ACE inhibitor/angiotensin II receptor blocker is not being taken. She does not see a podiatrist.Eye exam is not current.  Hyperlipidemia This is a chronic problem. The current episode started more than 1 year ago. The problem is uncontrolled. Recent lipid tests were reviewed and are variable. Exacerbating diseases include chronic renal disease, diabetes, hypothyroidism and obesity. Factors aggravating her hyperlipidemia include smoking and fatty foods. Current antihyperlipidemic treatment includes statins. The current treatment provides mild improvement of lipids. Compliance problems include adherence to diet and adherence to exercise.  Risk factors for coronary artery disease include diabetes mellitus, dyslipidemia, family history, obesity, a sedentary lifestyle and post-menopausal.  Thyroid Problem Presents for initial visit. Symptoms include cold intolerance, depressed mood and tremors. Patient reports no fatigue. The symptoms have been worsening. Past treatments include  levothyroxine (her thyroid hormone was stopped for unknown reasons). Her past medical history is significant for diabetes, hyperlipidemia and obesity.    Review of systems  Constitutional: + Minimally fluctuating body weight, current There is no height or weight on file to calculate BMI., no fatigue, no subjective hyperthermia, no subjective hypothermia Eyes: no blurry vision, no xerophthalmia ENT: no sore throat, no nodules palpated in throat, no dysphagia/odynophagia, no hoarseness Cardiovascular: no chest pain, no shortness of breath, no palpitations, no leg swelling Respiratory: no cough, COPD on supplemental oxygen (not currently on during the visit) Gastrointestinal: no nausea/vomiting/diarrhea Musculoskeletal: hx of chronic pain,  currently in wheelchair for deconditioning Skin: no rashes, no hyperemia Neurological: no tremors, no numbness, no tingling, no dizziness Psychiatric: no depression, no anxiety  Objective:     BP 102/64    Pulse 74    SpO2 94%   Wt Readings from Last 3 Encounters:  03/25/21 163 lb 12.8 oz (74.3 kg)  08/31/20 151 lb 9.6 oz (68.8 kg)  08/17/20 158 lb 6.4 oz (71.8 kg)     BP Readings from Last 3 Encounters:  07/21/21 102/64  05/21/21 130/78  04/19/21 102/65      Physical Exam- Limited  Constitutional:  There is no height or weight on file to calculate BMI. , not in acute distress, normal state of mind Eyes:  EOMI, no exophthalmos Neck: Supple Cardiovascular: RRR, no murmurs, rubs, or gallops, no edema Respiratory: Adequate breathing efforts, no crackles, rales, rhonchi, or wheezing Musculoskeletal: no gross deformities, strength intact in all four extremities, no gross restriction of joint movements, essentially WC bound due to deconditioning Skin:  no rashes, no hyperemia Neurological: no tremor with outstretched hands    CMP ( most recent) CMP     Component Value Date/Time   NA 140 03/30/2021 0000   K 4.2 03/30/2021 0000   CL 101  03/30/2021 0000   CO2 23 (A) 03/30/2021 0000   GLUCOSE 82 03/25/2021 0509   BUN 11 03/30/2021 0000   CREATININE 1.2 (A) 03/30/2021 0000   CREATININE 1.14 (H) 03/25/2021 0509   CALCIUM 9.4 03/30/2021 0000   PROT 5.9 (L) 03/25/2021 3614  ALBUMIN 3.2 (L) 03/25/2021 0509   AST 47 (H) 03/25/2021 0509   ALT 45 (H) 03/25/2021 0509   ALKPHOS 71 03/25/2021 0509   BILITOT 0.5 03/25/2021 0509   GFRNONAA 50 (L) 03/25/2021 0509   GFRAA 59.61 08/13/2020 0000     Diabetic Labs (most recent): Lab Results  Component Value Date   HGBA1C 5.7 07/21/2021   HGBA1C 10.8 (H) 03/23/2021   HGBA1C 10.9 (H) 03/22/2021     Lipid Panel ( most recent) Lipid Panel     Component Value Date/Time   CHOL 112 03/23/2021 0441   TRIG 197 (H) 03/23/2021 0441   HDL 22 (L) 03/23/2021 0441   CHOLHDL 5.1 03/23/2021 0441   VLDL 39 03/23/2021 0441   LDLCALC 51 03/23/2021 0441      Lab Results  Component Value Date   TSH 5.580 (H) 07/16/2021   TSH 17.800 (H) 04/06/2021   TSH 0.440 08/03/2020   TSH 0.266 (L) 09/08/2015   TSH 0.696 08/27/2013   TSH 0.025 (L) 05/27/2013   FREET4 1.45 07/16/2021   FREET4 1.36 04/06/2021           Assessment & Plan:   1) Uncontrolled type 2 diabetes mellitus with hyperglycemia ([place])  She presents today, accompanied by her family, with her meter and logs showing tight fasting and at goal postprandial glycemic profile.  Her POCT A1c today is 5.7%, improving drastically from last visit of 10.8%.  She does have some mild random hypoglycemia noted, usually due to meal timing.  - Amanda Armstrong has currently uncontrolled symptomatic type 2 DM since 76 years of age(just recently diagnosed during her recent hospitalization), with most recent A1c of 10.8 %.   -Recent labs reviewed.  - I had a long discussion with her about the progressive nature of diabetes and the pathology behind its complications. -her diabetes is complicated by CKD stage 3, chronic smoking and she  remains at a high risk for more acute and chronic complications which include CAD, CVA, CKD, retinopathy, and neuropathy. These are all discussed in detail with her.  - Nutritional counseling repeated at each appointment due to patients tendency to fall back in to old habits.  - The patient admits there is a room for improvement in their diet and drink choices. -  Suggestion is made for the patient to avoid simple carbohydrates from their diet including Cakes, Sweet Desserts / Pastries, Ice Cream, Soda (diet and regular), Sweet Tea, Candies, Chips, Cookies, Sweet Pastries, Store Bought Juices, Alcohol in Excess of 1-2 drinks a day, Artificial Sweeteners, Coffee Creamer, and "Sugar-free" Products. This will help patient to have stable blood glucose profile and potentially avoid unintended weight gain.   - I encouraged the patient to switch to unprocessed or minimally processed complex starch and increased protein intake (animal or plant source), fruits, and vegetables.   - Patient is advised to stick to a routine mealtimes to eat 3 meals a day and avoid unnecessary snacks (to snack only to correct hypoglycemia).  - she will be scheduled with Jearld Fenton, RDN, CDE for diabetes education.  - I have approached her with the following individualized plan to manage her diabetes and patient agrees:   Based on her tight fasting glycemic profile, will lower her Tyler Aas to 6 units SQ nightly for now.  -she is encouraged to continue monitoring glucose twice daily, before breakfast and before bed, and to call the clinic if she has readings less than 70 or above 200 for 3 tests  in a row.   - she is warned not to take insulin without proper monitoring per orders. - Adjustment parameters are given to her for hypo and hyperglycemia in writing.  - she is not a candidate for Metformin, SGLT2i due to concurrent renal insufficiency.  - she is not a candidate for incretin therapy due to heavy smoking history  increasing her risk of pancreatitis.  - Specific targets for  A1c; LDL, HDL, and Triglycerides were discussed with the patient.  2) Blood Pressure /Hypertension:  her blood pressure is controlled to target.   she is advised to continue her current medications including Lasix 20 mg p.o. daily with breakfast.  3) Lipids/Hyperlipidemia:    Review of her recent lipid panel from 03/23/21 showed controlled LDL at 51 and elevated triglycerides of 197 .  she is advised to continue Lipitor 20 mg daily at bedtime.  Side effects and precautions discussed with her.  4)  Weight/Diet:  her There is no height or weight on file to calculate BMI.  -  clearly complicating her diabetes care.   she is a candidate for weight loss. I discussed with her the fact that loss of 5 - 10% of her  current body weight will have the most impact on her diabetes management.  Exercise, and detailed carbohydrates information provided  -  detailed on discharge instructions.  5) Hypothyroidism r/t Hashimoto's thyroiditis Her previsit thyroid function tests are consistent with appropriate hormone replacement (TSH slightly high but normal, safe level for FT4).  Her PCP recently increased her Levothyroxine to 75 mcg, she is advised to continue this dose for now.  With multiple providers, it is hard to manage her thyroid condition optimally.  We discussed this today and they want for me to take over her thyroid medication management.  - The correct intake of thyroid hormone (Levothyroxine, Synthroid), is on empty stomach first thing in the morning, with water, separated by at least 30 minutes from breakfast and other medications,  and separated by more than 4 hours from calcium, iron, multivitamins, acid reflux medications (PPIs).  - This medication is a life-long medication and will be needed to correct thyroid hormone imbalances for the rest of your life.  The dose may change from time to time, based on thyroid blood work.  - It is  extremely important to be consistent taking this medication, near the same time each morning.  -AVOID TAKING PRODUCTS CONTAINING BIOTIN (commonly found in Hair, Skin, Nails vitamins) AS IT INTERFERES WITH THE VALIDITY OF THYROID FUNCTION BLOOD TESTS.  6) Chronic Care/Health Maintenance: -she is not on ACEI/ARB and is on Statin medications and is encouraged to initiate and continue to follow up with Ophthalmology, Dentist, Podiatrist at least yearly or according to recommendations, and advised to [place]. I have recommended yearly flu vaccine and pneumonia vaccine at least every 5 years; moderate intensity exercise for up to 150 minutes weekly; and sleep for at least 7 hours a day.  - she is advised to maintain close follow up with Redmond School, MD for primary care needs, as well as her other providers for optimal and coordinated care.      I spent 41 minutes in the care of the patient today including review of labs from [facility], Lipids, Thyroid Function, Hematology (current and previous including abstractions from other facilities); face-to-face time discussing  her blood glucose readings/logs, discussing hypoglycemia and hyperglycemia episodes and symptoms, medications doses, her options of short and long term treatment based on  the latest standards of care / guidelines;  discussion about incorporating lifestyle medicine;  and documenting the encounter.    Please refer to Patient Instructions for Blood Glucose Monitoring and Insulin/Medications Dosing Guide"  in media tab for additional information. Please  also refer to " Patient Self Inventory" in the Media  tab for reviewed elements of pertinent patient history.  Amanda Armstrong participated in the discussions, expressed understanding, and voiced agreement with the above plans.  All questions were answered to her satisfaction. she is encouraged to contact clinic should she have any questions or concerns prior to her return  visit.     Follow up plan: - Return in about 4 months (around 11/18/2021) for Diabetes F/U with A1c in office, Previsit labs, Bring meter and logs, Thyroid follow up.    Amanda Armstrong, [facility] [address] [place], [place] [address] Phone: [phone] Fax: [phone]  07/21/2021, 10:49 AM

## 2021-07-21 NOTE — Patient Instructions (Signed)

## 2021-07-23 DIAGNOSIS — R5383 Other fatigue: Secondary | ICD-10-CM | POA: Diagnosis not present

## 2021-07-23 DIAGNOSIS — Z87440 Personal history of urinary (tract) infections: Secondary | ICD-10-CM | POA: Diagnosis not present

## 2021-07-23 DIAGNOSIS — M159 Polyosteoarthritis, unspecified: Secondary | ICD-10-CM | POA: Diagnosis not present

## 2021-07-23 DIAGNOSIS — R2681 Unsteadiness on feet: Secondary | ICD-10-CM | POA: Diagnosis not present

## 2021-07-23 DIAGNOSIS — N183 Chronic kidney disease, stage 3 unspecified: Secondary | ICD-10-CM | POA: Diagnosis not present

## 2021-07-23 DIAGNOSIS — E063 Autoimmune thyroiditis: Secondary | ICD-10-CM | POA: Diagnosis not present

## 2021-07-23 DIAGNOSIS — E1122 Type 2 diabetes mellitus with diabetic chronic kidney disease: Secondary | ICD-10-CM | POA: Diagnosis not present

## 2021-07-23 DIAGNOSIS — M810 Age-related osteoporosis without current pathological fracture: Secondary | ICD-10-CM | POA: Diagnosis not present

## 2021-07-23 DIAGNOSIS — J449 Chronic obstructive pulmonary disease, unspecified: Secondary | ICD-10-CM | POA: Diagnosis not present

## 2021-07-23 DIAGNOSIS — U099 Post covid-19 condition, unspecified: Secondary | ICD-10-CM | POA: Diagnosis not present

## 2021-07-27 ENCOUNTER — Other Ambulatory Visit: Payer: Self-pay

## 2021-07-27 ENCOUNTER — Telehealth: Payer: Self-pay | Admitting: "Endocrinology

## 2021-07-27 MED ORDER — RELION PEN NEEDLES 31G X 6 MM MISC: 1 refills | Status: DC

## 2021-07-27 MED ORDER — ACCU-CHEK SOFTCLIX LANCETS MISC: 1 refills | Status: DC

## 2021-07-27 NOTE — Telephone Encounter (Signed)
Pen needles and lancets have been sent to the requested pharmacy for patient.

## 2021-07-27 NOTE — Telephone Encounter (Signed)
Pt is requesting a refill on Accu Chek Guide Lancets, Pen needles for her Insulin and the Relion needles. She uses [facility] [place]

## 2021-07-28 DIAGNOSIS — Z87440 Personal history of urinary (tract) infections: Secondary | ICD-10-CM | POA: Diagnosis not present

## 2021-07-28 DIAGNOSIS — U099 Post covid-19 condition, unspecified: Secondary | ICD-10-CM | POA: Diagnosis not present

## 2021-07-28 DIAGNOSIS — M810 Age-related osteoporosis without current pathological fracture: Secondary | ICD-10-CM | POA: Diagnosis not present

## 2021-07-28 DIAGNOSIS — E063 Autoimmune thyroiditis: Secondary | ICD-10-CM | POA: Diagnosis not present

## 2021-07-28 DIAGNOSIS — R2681 Unsteadiness on feet: Secondary | ICD-10-CM | POA: Diagnosis not present

## 2021-07-28 DIAGNOSIS — E1122 Type 2 diabetes mellitus with diabetic chronic kidney disease: Secondary | ICD-10-CM | POA: Diagnosis not present

## 2021-07-28 DIAGNOSIS — N183 Chronic kidney disease, stage 3 unspecified: Secondary | ICD-10-CM | POA: Diagnosis not present

## 2021-07-28 DIAGNOSIS — J449 Chronic obstructive pulmonary disease, unspecified: Secondary | ICD-10-CM | POA: Diagnosis not present

## 2021-07-28 DIAGNOSIS — R5383 Other fatigue: Secondary | ICD-10-CM | POA: Diagnosis not present

## 2021-07-28 DIAGNOSIS — M159 Polyosteoarthritis, unspecified: Secondary | ICD-10-CM | POA: Diagnosis not present

## 2021-07-31 DIAGNOSIS — U071 COVID-19: Secondary | ICD-10-CM | POA: Diagnosis not present

## 2021-07-31 DIAGNOSIS — J449 Chronic obstructive pulmonary disease, unspecified: Secondary | ICD-10-CM | POA: Diagnosis not present

## 2021-07-31 DIAGNOSIS — J9601 Acute respiratory failure with hypoxia: Secondary | ICD-10-CM | POA: Diagnosis not present

## 2021-08-04 DIAGNOSIS — E1122 Type 2 diabetes mellitus with diabetic chronic kidney disease: Secondary | ICD-10-CM | POA: Diagnosis not present

## 2021-08-04 DIAGNOSIS — U099 Post covid-19 condition, unspecified: Secondary | ICD-10-CM | POA: Diagnosis not present

## 2021-08-04 DIAGNOSIS — E063 Autoimmune thyroiditis: Secondary | ICD-10-CM | POA: Diagnosis not present

## 2021-08-04 DIAGNOSIS — R5383 Other fatigue: Secondary | ICD-10-CM | POA: Diagnosis not present

## 2021-08-04 DIAGNOSIS — J449 Chronic obstructive pulmonary disease, unspecified: Secondary | ICD-10-CM | POA: Diagnosis not present

## 2021-08-04 DIAGNOSIS — M810 Age-related osteoporosis without current pathological fracture: Secondary | ICD-10-CM | POA: Diagnosis not present

## 2021-08-04 DIAGNOSIS — M159 Polyosteoarthritis, unspecified: Secondary | ICD-10-CM | POA: Diagnosis not present

## 2021-08-04 DIAGNOSIS — R2681 Unsteadiness on feet: Secondary | ICD-10-CM | POA: Diagnosis not present

## 2021-08-04 DIAGNOSIS — N183 Chronic kidney disease, stage 3 unspecified: Secondary | ICD-10-CM | POA: Diagnosis not present

## 2021-08-04 DIAGNOSIS — Z87440 Personal history of urinary (tract) infections: Secondary | ICD-10-CM | POA: Diagnosis not present

## 2021-08-09 DIAGNOSIS — E1122 Type 2 diabetes mellitus with diabetic chronic kidney disease: Secondary | ICD-10-CM | POA: Diagnosis not present

## 2021-08-09 DIAGNOSIS — M159 Polyosteoarthritis, unspecified: Secondary | ICD-10-CM | POA: Diagnosis not present

## 2021-08-09 DIAGNOSIS — N183 Chronic kidney disease, stage 3 unspecified: Secondary | ICD-10-CM | POA: Diagnosis not present

## 2021-08-09 DIAGNOSIS — Z87440 Personal history of urinary (tract) infections: Secondary | ICD-10-CM | POA: Diagnosis not present

## 2021-08-09 DIAGNOSIS — E063 Autoimmune thyroiditis: Secondary | ICD-10-CM | POA: Diagnosis not present

## 2021-08-09 DIAGNOSIS — R5383 Other fatigue: Secondary | ICD-10-CM | POA: Diagnosis not present

## 2021-08-09 DIAGNOSIS — M810 Age-related osteoporosis without current pathological fracture: Secondary | ICD-10-CM | POA: Diagnosis not present

## 2021-08-09 DIAGNOSIS — J449 Chronic obstructive pulmonary disease, unspecified: Secondary | ICD-10-CM | POA: Diagnosis not present

## 2021-08-09 DIAGNOSIS — R2681 Unsteadiness on feet: Secondary | ICD-10-CM | POA: Diagnosis not present

## 2021-08-09 DIAGNOSIS — U099 Post covid-19 condition, unspecified: Secondary | ICD-10-CM | POA: Diagnosis not present

## 2021-08-12 DIAGNOSIS — E1122 Type 2 diabetes mellitus with diabetic chronic kidney disease: Secondary | ICD-10-CM | POA: Diagnosis not present

## 2021-08-12 DIAGNOSIS — U099 Post covid-19 condition, unspecified: Secondary | ICD-10-CM | POA: Diagnosis not present

## 2021-08-12 DIAGNOSIS — R2681 Unsteadiness on feet: Secondary | ICD-10-CM | POA: Diagnosis not present

## 2021-08-12 DIAGNOSIS — J449 Chronic obstructive pulmonary disease, unspecified: Secondary | ICD-10-CM | POA: Diagnosis not present

## 2021-08-12 DIAGNOSIS — E063 Autoimmune thyroiditis: Secondary | ICD-10-CM | POA: Diagnosis not present

## 2021-08-12 DIAGNOSIS — R5383 Other fatigue: Secondary | ICD-10-CM | POA: Diagnosis not present

## 2021-08-12 DIAGNOSIS — Z87440 Personal history of urinary (tract) infections: Secondary | ICD-10-CM | POA: Diagnosis not present

## 2021-08-12 DIAGNOSIS — M810 Age-related osteoporosis without current pathological fracture: Secondary | ICD-10-CM | POA: Diagnosis not present

## 2021-08-12 DIAGNOSIS — N183 Chronic kidney disease, stage 3 unspecified: Secondary | ICD-10-CM | POA: Diagnosis not present

## 2021-08-12 DIAGNOSIS — M159 Polyosteoarthritis, unspecified: Secondary | ICD-10-CM | POA: Diagnosis not present

## 2021-08-13 DIAGNOSIS — G894 Chronic pain syndrome: Secondary | ICD-10-CM | POA: Diagnosis not present

## 2021-08-13 DIAGNOSIS — B3731 Acute candidiasis of vulva and vagina: Secondary | ICD-10-CM | POA: Diagnosis not present

## 2021-08-13 DIAGNOSIS — N390 Urinary tract infection, site not specified: Secondary | ICD-10-CM | POA: Diagnosis not present

## 2021-08-17 DIAGNOSIS — J449 Chronic obstructive pulmonary disease, unspecified: Secondary | ICD-10-CM | POA: Diagnosis not present

## 2021-08-17 DIAGNOSIS — R5383 Other fatigue: Secondary | ICD-10-CM | POA: Diagnosis not present

## 2021-08-17 DIAGNOSIS — R2681 Unsteadiness on feet: Secondary | ICD-10-CM | POA: Diagnosis not present

## 2021-08-17 DIAGNOSIS — M159 Polyosteoarthritis, unspecified: Secondary | ICD-10-CM | POA: Diagnosis not present

## 2021-08-17 DIAGNOSIS — M810 Age-related osteoporosis without current pathological fracture: Secondary | ICD-10-CM | POA: Diagnosis not present

## 2021-08-17 DIAGNOSIS — N183 Chronic kidney disease, stage 3 unspecified: Secondary | ICD-10-CM | POA: Diagnosis not present

## 2021-08-17 DIAGNOSIS — E1122 Type 2 diabetes mellitus with diabetic chronic kidney disease: Secondary | ICD-10-CM | POA: Diagnosis not present

## 2021-08-17 DIAGNOSIS — E063 Autoimmune thyroiditis: Secondary | ICD-10-CM | POA: Diagnosis not present

## 2021-08-17 DIAGNOSIS — E7849 Other hyperlipidemia: Secondary | ICD-10-CM | POA: Diagnosis not present

## 2021-08-17 DIAGNOSIS — U099 Post covid-19 condition, unspecified: Secondary | ICD-10-CM | POA: Diagnosis not present

## 2021-08-17 DIAGNOSIS — Z87440 Personal history of urinary (tract) infections: Secondary | ICD-10-CM | POA: Diagnosis not present

## 2021-08-19 DIAGNOSIS — E1122 Type 2 diabetes mellitus with diabetic chronic kidney disease: Secondary | ICD-10-CM | POA: Diagnosis not present

## 2021-08-19 DIAGNOSIS — J449 Chronic obstructive pulmonary disease, unspecified: Secondary | ICD-10-CM | POA: Diagnosis not present

## 2021-08-19 DIAGNOSIS — R5383 Other fatigue: Secondary | ICD-10-CM | POA: Diagnosis not present

## 2021-08-19 DIAGNOSIS — R2681 Unsteadiness on feet: Secondary | ICD-10-CM | POA: Diagnosis not present

## 2021-08-19 DIAGNOSIS — Z87440 Personal history of urinary (tract) infections: Secondary | ICD-10-CM | POA: Diagnosis not present

## 2021-08-19 DIAGNOSIS — U099 Post covid-19 condition, unspecified: Secondary | ICD-10-CM | POA: Diagnosis not present

## 2021-08-19 DIAGNOSIS — M159 Polyosteoarthritis, unspecified: Secondary | ICD-10-CM | POA: Diagnosis not present

## 2021-08-19 DIAGNOSIS — E063 Autoimmune thyroiditis: Secondary | ICD-10-CM | POA: Diagnosis not present

## 2021-08-19 DIAGNOSIS — N183 Chronic kidney disease, stage 3 unspecified: Secondary | ICD-10-CM | POA: Diagnosis not present

## 2021-08-19 DIAGNOSIS — M810 Age-related osteoporosis without current pathological fracture: Secondary | ICD-10-CM | POA: Diagnosis not present

## 2021-08-23 DIAGNOSIS — E063 Autoimmune thyroiditis: Secondary | ICD-10-CM | POA: Diagnosis not present

## 2021-08-23 DIAGNOSIS — M159 Polyosteoarthritis, unspecified: Secondary | ICD-10-CM | POA: Diagnosis not present

## 2021-08-23 DIAGNOSIS — Z87440 Personal history of urinary (tract) infections: Secondary | ICD-10-CM | POA: Diagnosis not present

## 2021-08-23 DIAGNOSIS — N183 Chronic kidney disease, stage 3 unspecified: Secondary | ICD-10-CM | POA: Diagnosis not present

## 2021-08-23 DIAGNOSIS — R2681 Unsteadiness on feet: Secondary | ICD-10-CM | POA: Diagnosis not present

## 2021-08-23 DIAGNOSIS — J449 Chronic obstructive pulmonary disease, unspecified: Secondary | ICD-10-CM | POA: Diagnosis not present

## 2021-08-23 DIAGNOSIS — M810 Age-related osteoporosis without current pathological fracture: Secondary | ICD-10-CM | POA: Diagnosis not present

## 2021-08-23 DIAGNOSIS — E1122 Type 2 diabetes mellitus with diabetic chronic kidney disease: Secondary | ICD-10-CM | POA: Diagnosis not present

## 2021-08-23 DIAGNOSIS — U099 Post covid-19 condition, unspecified: Secondary | ICD-10-CM | POA: Diagnosis not present

## 2021-08-23 DIAGNOSIS — R5383 Other fatigue: Secondary | ICD-10-CM | POA: Diagnosis not present

## 2021-08-25 DIAGNOSIS — J449 Chronic obstructive pulmonary disease, unspecified: Secondary | ICD-10-CM | POA: Diagnosis not present

## 2021-08-25 DIAGNOSIS — R5383 Other fatigue: Secondary | ICD-10-CM | POA: Diagnosis not present

## 2021-08-25 DIAGNOSIS — M159 Polyosteoarthritis, unspecified: Secondary | ICD-10-CM | POA: Diagnosis not present

## 2021-08-25 DIAGNOSIS — Z87440 Personal history of urinary (tract) infections: Secondary | ICD-10-CM | POA: Diagnosis not present

## 2021-08-25 DIAGNOSIS — N183 Chronic kidney disease, stage 3 unspecified: Secondary | ICD-10-CM | POA: Diagnosis not present

## 2021-08-25 DIAGNOSIS — E063 Autoimmune thyroiditis: Secondary | ICD-10-CM | POA: Diagnosis not present

## 2021-08-25 DIAGNOSIS — E1122 Type 2 diabetes mellitus with diabetic chronic kidney disease: Secondary | ICD-10-CM | POA: Diagnosis not present

## 2021-08-25 DIAGNOSIS — R2681 Unsteadiness on feet: Secondary | ICD-10-CM | POA: Diagnosis not present

## 2021-08-25 DIAGNOSIS — U099 Post covid-19 condition, unspecified: Secondary | ICD-10-CM | POA: Diagnosis not present

## 2021-08-25 DIAGNOSIS — M810 Age-related osteoporosis without current pathological fracture: Secondary | ICD-10-CM | POA: Diagnosis not present

## 2021-08-31 DIAGNOSIS — J9601 Acute respiratory failure with hypoxia: Secondary | ICD-10-CM | POA: Diagnosis not present

## 2021-08-31 DIAGNOSIS — J449 Chronic obstructive pulmonary disease, unspecified: Secondary | ICD-10-CM | POA: Diagnosis not present

## 2021-08-31 DIAGNOSIS — U071 COVID-19: Secondary | ICD-10-CM | POA: Diagnosis not present

## 2021-09-01 DIAGNOSIS — E1122 Type 2 diabetes mellitus with diabetic chronic kidney disease: Secondary | ICD-10-CM | POA: Diagnosis not present

## 2021-09-01 DIAGNOSIS — N183 Chronic kidney disease, stage 3 unspecified: Secondary | ICD-10-CM | POA: Diagnosis not present

## 2021-09-01 DIAGNOSIS — M810 Age-related osteoporosis without current pathological fracture: Secondary | ICD-10-CM | POA: Diagnosis not present

## 2021-09-01 DIAGNOSIS — R5383 Other fatigue: Secondary | ICD-10-CM | POA: Diagnosis not present

## 2021-09-01 DIAGNOSIS — Z87440 Personal history of urinary (tract) infections: Secondary | ICD-10-CM | POA: Diagnosis not present

## 2021-09-01 DIAGNOSIS — E063 Autoimmune thyroiditis: Secondary | ICD-10-CM | POA: Diagnosis not present

## 2021-09-01 DIAGNOSIS — U099 Post covid-19 condition, unspecified: Secondary | ICD-10-CM | POA: Diagnosis not present

## 2021-09-01 DIAGNOSIS — J449 Chronic obstructive pulmonary disease, unspecified: Secondary | ICD-10-CM | POA: Diagnosis not present

## 2021-09-01 DIAGNOSIS — M159 Polyosteoarthritis, unspecified: Secondary | ICD-10-CM | POA: Diagnosis not present

## 2021-09-01 DIAGNOSIS — R2681 Unsteadiness on feet: Secondary | ICD-10-CM | POA: Diagnosis not present

## 2021-09-02 DIAGNOSIS — M810 Age-related osteoporosis without current pathological fracture: Secondary | ICD-10-CM | POA: Diagnosis not present

## 2021-09-02 DIAGNOSIS — J449 Chronic obstructive pulmonary disease, unspecified: Secondary | ICD-10-CM | POA: Diagnosis not present

## 2021-09-02 DIAGNOSIS — Z87440 Personal history of urinary (tract) infections: Secondary | ICD-10-CM | POA: Diagnosis not present

## 2021-09-02 DIAGNOSIS — R2681 Unsteadiness on feet: Secondary | ICD-10-CM | POA: Diagnosis not present

## 2021-09-02 DIAGNOSIS — E1122 Type 2 diabetes mellitus with diabetic chronic kidney disease: Secondary | ICD-10-CM | POA: Diagnosis not present

## 2021-09-02 DIAGNOSIS — M159 Polyosteoarthritis, unspecified: Secondary | ICD-10-CM | POA: Diagnosis not present

## 2021-09-02 DIAGNOSIS — E063 Autoimmune thyroiditis: Secondary | ICD-10-CM | POA: Diagnosis not present

## 2021-09-02 DIAGNOSIS — N183 Chronic kidney disease, stage 3 unspecified: Secondary | ICD-10-CM | POA: Diagnosis not present

## 2021-09-02 DIAGNOSIS — R5383 Other fatigue: Secondary | ICD-10-CM | POA: Diagnosis not present

## 2021-09-02 DIAGNOSIS — U099 Post covid-19 condition, unspecified: Secondary | ICD-10-CM | POA: Diagnosis not present

## 2021-09-06 DIAGNOSIS — E063 Autoimmune thyroiditis: Secondary | ICD-10-CM | POA: Diagnosis not present

## 2021-09-06 DIAGNOSIS — M159 Polyosteoarthritis, unspecified: Secondary | ICD-10-CM | POA: Diagnosis not present

## 2021-09-06 DIAGNOSIS — N183 Chronic kidney disease, stage 3 unspecified: Secondary | ICD-10-CM | POA: Diagnosis not present

## 2021-09-06 DIAGNOSIS — Z87440 Personal history of urinary (tract) infections: Secondary | ICD-10-CM | POA: Diagnosis not present

## 2021-09-06 DIAGNOSIS — R5383 Other fatigue: Secondary | ICD-10-CM | POA: Diagnosis not present

## 2021-09-06 DIAGNOSIS — E1122 Type 2 diabetes mellitus with diabetic chronic kidney disease: Secondary | ICD-10-CM | POA: Diagnosis not present

## 2021-09-06 DIAGNOSIS — J449 Chronic obstructive pulmonary disease, unspecified: Secondary | ICD-10-CM | POA: Diagnosis not present

## 2021-09-06 DIAGNOSIS — R2681 Unsteadiness on feet: Secondary | ICD-10-CM | POA: Diagnosis not present

## 2021-09-06 DIAGNOSIS — U099 Post covid-19 condition, unspecified: Secondary | ICD-10-CM | POA: Diagnosis not present

## 2021-09-06 DIAGNOSIS — M810 Age-related osteoporosis without current pathological fracture: Secondary | ICD-10-CM | POA: Diagnosis not present

## 2021-09-08 DIAGNOSIS — E063 Autoimmune thyroiditis: Secondary | ICD-10-CM | POA: Diagnosis not present

## 2021-09-08 DIAGNOSIS — R5383 Other fatigue: Secondary | ICD-10-CM | POA: Diagnosis not present

## 2021-09-08 DIAGNOSIS — R2681 Unsteadiness on feet: Secondary | ICD-10-CM | POA: Diagnosis not present

## 2021-09-08 DIAGNOSIS — N183 Chronic kidney disease, stage 3 unspecified: Secondary | ICD-10-CM | POA: Diagnosis not present

## 2021-09-08 DIAGNOSIS — E1122 Type 2 diabetes mellitus with diabetic chronic kidney disease: Secondary | ICD-10-CM | POA: Diagnosis not present

## 2021-09-08 DIAGNOSIS — U099 Post covid-19 condition, unspecified: Secondary | ICD-10-CM | POA: Diagnosis not present

## 2021-09-08 DIAGNOSIS — J449 Chronic obstructive pulmonary disease, unspecified: Secondary | ICD-10-CM | POA: Diagnosis not present

## 2021-09-08 DIAGNOSIS — M159 Polyosteoarthritis, unspecified: Secondary | ICD-10-CM | POA: Diagnosis not present

## 2021-09-08 DIAGNOSIS — M810 Age-related osteoporosis without current pathological fracture: Secondary | ICD-10-CM | POA: Diagnosis not present

## 2021-09-08 DIAGNOSIS — Z87440 Personal history of urinary (tract) infections: Secondary | ICD-10-CM | POA: Diagnosis not present

## 2021-09-09 DIAGNOSIS — J449 Chronic obstructive pulmonary disease, unspecified: Secondary | ICD-10-CM | POA: Diagnosis not present

## 2021-09-09 DIAGNOSIS — G4730 Sleep apnea, unspecified: Secondary | ICD-10-CM | POA: Diagnosis not present

## 2021-09-09 DIAGNOSIS — U099 Post covid-19 condition, unspecified: Secondary | ICD-10-CM | POA: Diagnosis not present

## 2021-09-09 DIAGNOSIS — E063 Autoimmune thyroiditis: Secondary | ICD-10-CM | POA: Diagnosis not present

## 2021-09-09 DIAGNOSIS — N183 Chronic kidney disease, stage 3 unspecified: Secondary | ICD-10-CM | POA: Diagnosis not present

## 2021-09-09 DIAGNOSIS — M810 Age-related osteoporosis without current pathological fracture: Secondary | ICD-10-CM | POA: Diagnosis not present

## 2021-09-09 DIAGNOSIS — M159 Polyosteoarthritis, unspecified: Secondary | ICD-10-CM | POA: Diagnosis not present

## 2021-09-09 DIAGNOSIS — R2681 Unsteadiness on feet: Secondary | ICD-10-CM | POA: Diagnosis not present

## 2021-09-09 DIAGNOSIS — R5383 Other fatigue: Secondary | ICD-10-CM | POA: Diagnosis not present

## 2021-09-09 DIAGNOSIS — E1122 Type 2 diabetes mellitus with diabetic chronic kidney disease: Secondary | ICD-10-CM | POA: Diagnosis not present

## 2021-09-09 DIAGNOSIS — Z87440 Personal history of urinary (tract) infections: Secondary | ICD-10-CM | POA: Diagnosis not present

## 2021-09-09 DIAGNOSIS — G894 Chronic pain syndrome: Secondary | ICD-10-CM | POA: Diagnosis not present

## 2021-09-14 DIAGNOSIS — Z87440 Personal history of urinary (tract) infections: Secondary | ICD-10-CM | POA: Diagnosis not present

## 2021-09-14 DIAGNOSIS — E1122 Type 2 diabetes mellitus with diabetic chronic kidney disease: Secondary | ICD-10-CM | POA: Diagnosis not present

## 2021-09-14 DIAGNOSIS — E782 Mixed hyperlipidemia: Secondary | ICD-10-CM | POA: Diagnosis not present

## 2021-09-14 DIAGNOSIS — M159 Polyosteoarthritis, unspecified: Secondary | ICD-10-CM | POA: Diagnosis not present

## 2021-09-14 DIAGNOSIS — M810 Age-related osteoporosis without current pathological fracture: Secondary | ICD-10-CM | POA: Diagnosis not present

## 2021-09-14 DIAGNOSIS — J449 Chronic obstructive pulmonary disease, unspecified: Secondary | ICD-10-CM | POA: Diagnosis not present

## 2021-09-14 DIAGNOSIS — R2681 Unsteadiness on feet: Secondary | ICD-10-CM | POA: Diagnosis not present

## 2021-09-14 DIAGNOSIS — N183 Chronic kidney disease, stage 3 unspecified: Secondary | ICD-10-CM | POA: Diagnosis not present

## 2021-09-14 DIAGNOSIS — R5383 Other fatigue: Secondary | ICD-10-CM | POA: Diagnosis not present

## 2021-09-14 DIAGNOSIS — E063 Autoimmune thyroiditis: Secondary | ICD-10-CM | POA: Diagnosis not present

## 2021-09-14 DIAGNOSIS — U099 Post covid-19 condition, unspecified: Secondary | ICD-10-CM | POA: Diagnosis not present

## 2021-09-21 DIAGNOSIS — J449 Chronic obstructive pulmonary disease, unspecified: Secondary | ICD-10-CM | POA: Diagnosis not present

## 2021-09-21 DIAGNOSIS — R2681 Unsteadiness on feet: Secondary | ICD-10-CM | POA: Diagnosis not present

## 2021-09-21 DIAGNOSIS — R5383 Other fatigue: Secondary | ICD-10-CM | POA: Diagnosis not present

## 2021-09-21 DIAGNOSIS — N183 Chronic kidney disease, stage 3 unspecified: Secondary | ICD-10-CM | POA: Diagnosis not present

## 2021-09-21 DIAGNOSIS — U099 Post covid-19 condition, unspecified: Secondary | ICD-10-CM | POA: Diagnosis not present

## 2021-09-21 DIAGNOSIS — E1122 Type 2 diabetes mellitus with diabetic chronic kidney disease: Secondary | ICD-10-CM | POA: Diagnosis not present

## 2021-09-21 DIAGNOSIS — M159 Polyosteoarthritis, unspecified: Secondary | ICD-10-CM | POA: Diagnosis not present

## 2021-09-21 DIAGNOSIS — Z87440 Personal history of urinary (tract) infections: Secondary | ICD-10-CM | POA: Diagnosis not present

## 2021-09-21 DIAGNOSIS — M810 Age-related osteoporosis without current pathological fracture: Secondary | ICD-10-CM | POA: Diagnosis not present

## 2021-09-21 DIAGNOSIS — E063 Autoimmune thyroiditis: Secondary | ICD-10-CM | POA: Diagnosis not present

## 2021-09-27 DIAGNOSIS — E1122 Type 2 diabetes mellitus with diabetic chronic kidney disease: Secondary | ICD-10-CM | POA: Diagnosis not present

## 2021-09-27 DIAGNOSIS — E063 Autoimmune thyroiditis: Secondary | ICD-10-CM | POA: Diagnosis not present

## 2021-09-27 DIAGNOSIS — M810 Age-related osteoporosis without current pathological fracture: Secondary | ICD-10-CM | POA: Diagnosis not present

## 2021-09-27 DIAGNOSIS — U099 Post covid-19 condition, unspecified: Secondary | ICD-10-CM | POA: Diagnosis not present

## 2021-09-27 DIAGNOSIS — J449 Chronic obstructive pulmonary disease, unspecified: Secondary | ICD-10-CM | POA: Diagnosis not present

## 2021-09-27 DIAGNOSIS — R5383 Other fatigue: Secondary | ICD-10-CM | POA: Diagnosis not present

## 2021-09-27 DIAGNOSIS — M159 Polyosteoarthritis, unspecified: Secondary | ICD-10-CM | POA: Diagnosis not present

## 2021-09-27 DIAGNOSIS — Z87440 Personal history of urinary (tract) infections: Secondary | ICD-10-CM | POA: Diagnosis not present

## 2021-09-27 DIAGNOSIS — N183 Chronic kidney disease, stage 3 unspecified: Secondary | ICD-10-CM | POA: Diagnosis not present

## 2021-09-27 DIAGNOSIS — R2681 Unsteadiness on feet: Secondary | ICD-10-CM | POA: Diagnosis not present

## 2021-09-28 DIAGNOSIS — J9601 Acute respiratory failure with hypoxia: Secondary | ICD-10-CM | POA: Diagnosis not present

## 2021-09-28 DIAGNOSIS — U071 COVID-19: Secondary | ICD-10-CM | POA: Diagnosis not present

## 2021-09-28 DIAGNOSIS — J449 Chronic obstructive pulmonary disease, unspecified: Secondary | ICD-10-CM | POA: Diagnosis not present

## 2021-09-30 ENCOUNTER — Telehealth: Payer: Self-pay | Admitting: Nurse Practitioner

## 2021-09-30 NOTE — Telephone Encounter (Signed)
Called the daughter [name] and gave her the message from [place]. [name] stated that she did order another meter from [facility] and should have tomorrow. [name] verbalized an understanding to hold the [place] until her blood glucose can be checked. ?

## 2021-09-30 NOTE — Telephone Encounter (Signed)
For safety purposes, she should skip her Evaristo B[place] for now until she has a way to monitor glucose.

## 2021-09-30 NOTE — Telephone Encounter (Signed)
Please see messages. Please advise. °

## 2021-09-30 NOTE — Telephone Encounter (Signed)
Patient's daughter called and said she changed the batteries in her accu chek and now its reading error. She is not sure what she needs to do but would like a call back at [phone]. She also needs testing strips- [place] ?

## 2021-09-30 NOTE — Telephone Encounter (Signed)
Patient's daughter left a VM stating she called [facility] and they sending her a new meter. She wants to know what is she suppose to do from now until it comes which should be Monday. She would like a call back, [name] [phone] ?

## 2021-10-05 DIAGNOSIS — E1122 Type 2 diabetes mellitus with diabetic chronic kidney disease: Secondary | ICD-10-CM | POA: Diagnosis not present

## 2021-10-05 DIAGNOSIS — M159 Polyosteoarthritis, unspecified: Secondary | ICD-10-CM | POA: Diagnosis not present

## 2021-10-05 DIAGNOSIS — M810 Age-related osteoporosis without current pathological fracture: Secondary | ICD-10-CM | POA: Diagnosis not present

## 2021-10-05 DIAGNOSIS — U099 Post covid-19 condition, unspecified: Secondary | ICD-10-CM | POA: Diagnosis not present

## 2021-10-05 DIAGNOSIS — R5383 Other fatigue: Secondary | ICD-10-CM | POA: Diagnosis not present

## 2021-10-05 DIAGNOSIS — Z87440 Personal history of urinary (tract) infections: Secondary | ICD-10-CM | POA: Diagnosis not present

## 2021-10-05 DIAGNOSIS — N183 Chronic kidney disease, stage 3 unspecified: Secondary | ICD-10-CM | POA: Diagnosis not present

## 2021-10-05 DIAGNOSIS — E063 Autoimmune thyroiditis: Secondary | ICD-10-CM | POA: Diagnosis not present

## 2021-10-05 DIAGNOSIS — R2681 Unsteadiness on feet: Secondary | ICD-10-CM | POA: Diagnosis not present

## 2021-10-05 DIAGNOSIS — J449 Chronic obstructive pulmonary disease, unspecified: Secondary | ICD-10-CM | POA: Diagnosis not present

## 2021-10-12 DIAGNOSIS — U099 Post covid-19 condition, unspecified: Secondary | ICD-10-CM | POA: Diagnosis not present

## 2021-10-12 DIAGNOSIS — R5383 Other fatigue: Secondary | ICD-10-CM | POA: Diagnosis not present

## 2021-10-12 DIAGNOSIS — J449 Chronic obstructive pulmonary disease, unspecified: Secondary | ICD-10-CM | POA: Diagnosis not present

## 2021-10-12 DIAGNOSIS — R2681 Unsteadiness on feet: Secondary | ICD-10-CM | POA: Diagnosis not present

## 2021-10-12 DIAGNOSIS — N183 Chronic kidney disease, stage 3 unspecified: Secondary | ICD-10-CM | POA: Diagnosis not present

## 2021-10-12 DIAGNOSIS — Z87440 Personal history of urinary (tract) infections: Secondary | ICD-10-CM | POA: Diagnosis not present

## 2021-10-12 DIAGNOSIS — M810 Age-related osteoporosis without current pathological fracture: Secondary | ICD-10-CM | POA: Diagnosis not present

## 2021-10-12 DIAGNOSIS — E1122 Type 2 diabetes mellitus with diabetic chronic kidney disease: Secondary | ICD-10-CM | POA: Diagnosis not present

## 2021-10-12 DIAGNOSIS — M159 Polyosteoarthritis, unspecified: Secondary | ICD-10-CM | POA: Diagnosis not present

## 2021-10-12 DIAGNOSIS — E063 Autoimmune thyroiditis: Secondary | ICD-10-CM | POA: Diagnosis not present

## 2021-10-13 DIAGNOSIS — N183 Chronic kidney disease, stage 3 unspecified: Secondary | ICD-10-CM | POA: Diagnosis not present

## 2021-10-13 DIAGNOSIS — E063 Autoimmune thyroiditis: Secondary | ICD-10-CM | POA: Diagnosis not present

## 2021-10-13 DIAGNOSIS — J449 Chronic obstructive pulmonary disease, unspecified: Secondary | ICD-10-CM | POA: Diagnosis not present

## 2021-10-13 DIAGNOSIS — M159 Polyosteoarthritis, unspecified: Secondary | ICD-10-CM | POA: Diagnosis not present

## 2021-10-14 ENCOUNTER — Other Ambulatory Visit: Payer: Self-pay

## 2021-10-14 MED ORDER — ACCU-CHEK GUIDE VI STRP: ORAL_STRIP | 1 refills | Status: DC

## 2021-10-14 NOTE — Telephone Encounter (Signed)
Patient asked for Test Strips on 3/16. Patient is still waiting on test strips. Can those please be sent [facility] to [facility] in [place]. ?

## 2021-10-14 NOTE — Telephone Encounter (Signed)
Prescription has been sent to the requested pharmacy. ?

## 2021-10-15 DIAGNOSIS — J449 Chronic obstructive pulmonary disease, unspecified: Secondary | ICD-10-CM | POA: Diagnosis not present

## 2021-10-15 DIAGNOSIS — E782 Mixed hyperlipidemia: Secondary | ICD-10-CM | POA: Diagnosis not present

## 2021-10-20 DIAGNOSIS — M810 Age-related osteoporosis without current pathological fracture: Secondary | ICD-10-CM | POA: Diagnosis not present

## 2021-10-20 DIAGNOSIS — E063 Autoimmune thyroiditis: Secondary | ICD-10-CM | POA: Diagnosis not present

## 2021-10-20 DIAGNOSIS — N183 Chronic kidney disease, stage 3 unspecified: Secondary | ICD-10-CM | POA: Diagnosis not present

## 2021-10-20 DIAGNOSIS — U099 Post covid-19 condition, unspecified: Secondary | ICD-10-CM | POA: Diagnosis not present

## 2021-10-20 DIAGNOSIS — Z87440 Personal history of urinary (tract) infections: Secondary | ICD-10-CM | POA: Diagnosis not present

## 2021-10-20 DIAGNOSIS — E1122 Type 2 diabetes mellitus with diabetic chronic kidney disease: Secondary | ICD-10-CM | POA: Diagnosis not present

## 2021-10-20 DIAGNOSIS — R2681 Unsteadiness on feet: Secondary | ICD-10-CM | POA: Diagnosis not present

## 2021-10-20 DIAGNOSIS — M159 Polyosteoarthritis, unspecified: Secondary | ICD-10-CM | POA: Diagnosis not present

## 2021-10-20 DIAGNOSIS — J449 Chronic obstructive pulmonary disease, unspecified: Secondary | ICD-10-CM | POA: Diagnosis not present

## 2021-10-20 DIAGNOSIS — R5383 Other fatigue: Secondary | ICD-10-CM | POA: Diagnosis not present

## 2021-10-25 DIAGNOSIS — N183 Chronic kidney disease, stage 3 unspecified: Secondary | ICD-10-CM | POA: Diagnosis not present

## 2021-10-25 DIAGNOSIS — E1122 Type 2 diabetes mellitus with diabetic chronic kidney disease: Secondary | ICD-10-CM | POA: Diagnosis not present

## 2021-10-25 DIAGNOSIS — R2681 Unsteadiness on feet: Secondary | ICD-10-CM | POA: Diagnosis not present

## 2021-10-25 DIAGNOSIS — U099 Post covid-19 condition, unspecified: Secondary | ICD-10-CM | POA: Diagnosis not present

## 2021-10-25 DIAGNOSIS — E063 Autoimmune thyroiditis: Secondary | ICD-10-CM | POA: Diagnosis not present

## 2021-10-25 DIAGNOSIS — M810 Age-related osteoporosis without current pathological fracture: Secondary | ICD-10-CM | POA: Diagnosis not present

## 2021-10-25 DIAGNOSIS — J449 Chronic obstructive pulmonary disease, unspecified: Secondary | ICD-10-CM | POA: Diagnosis not present

## 2021-10-25 DIAGNOSIS — M159 Polyosteoarthritis, unspecified: Secondary | ICD-10-CM | POA: Diagnosis not present

## 2021-10-25 DIAGNOSIS — R5383 Other fatigue: Secondary | ICD-10-CM | POA: Diagnosis not present

## 2021-10-25 DIAGNOSIS — Z87440 Personal history of urinary (tract) infections: Secondary | ICD-10-CM | POA: Diagnosis not present

## 2021-10-29 DIAGNOSIS — U071 COVID-19: Secondary | ICD-10-CM | POA: Diagnosis not present

## 2021-10-29 DIAGNOSIS — J449 Chronic obstructive pulmonary disease, unspecified: Secondary | ICD-10-CM | POA: Diagnosis not present

## 2021-10-29 DIAGNOSIS — J9601 Acute respiratory failure with hypoxia: Secondary | ICD-10-CM | POA: Diagnosis not present

## 2021-11-03 DIAGNOSIS — E1122 Type 2 diabetes mellitus with diabetic chronic kidney disease: Secondary | ICD-10-CM | POA: Diagnosis not present

## 2021-11-03 DIAGNOSIS — J449 Chronic obstructive pulmonary disease, unspecified: Secondary | ICD-10-CM | POA: Diagnosis not present

## 2021-11-03 DIAGNOSIS — R2681 Unsteadiness on feet: Secondary | ICD-10-CM | POA: Diagnosis not present

## 2021-11-03 DIAGNOSIS — N183 Chronic kidney disease, stage 3 unspecified: Secondary | ICD-10-CM | POA: Diagnosis not present

## 2021-11-03 DIAGNOSIS — M810 Age-related osteoporosis without current pathological fracture: Secondary | ICD-10-CM | POA: Diagnosis not present

## 2021-11-03 DIAGNOSIS — R5383 Other fatigue: Secondary | ICD-10-CM | POA: Diagnosis not present

## 2021-11-03 DIAGNOSIS — E063 Autoimmune thyroiditis: Secondary | ICD-10-CM | POA: Diagnosis not present

## 2021-11-03 DIAGNOSIS — Z87440 Personal history of urinary (tract) infections: Secondary | ICD-10-CM | POA: Diagnosis not present

## 2021-11-03 DIAGNOSIS — U099 Post covid-19 condition, unspecified: Secondary | ICD-10-CM | POA: Diagnosis not present

## 2021-11-03 DIAGNOSIS — M159 Polyosteoarthritis, unspecified: Secondary | ICD-10-CM | POA: Diagnosis not present

## 2021-11-10 DIAGNOSIS — E1165 Type 2 diabetes mellitus with hyperglycemia: Secondary | ICD-10-CM | POA: Diagnosis not present

## 2021-11-10 DIAGNOSIS — J449 Chronic obstructive pulmonary disease, unspecified: Secondary | ICD-10-CM | POA: Diagnosis not present

## 2021-11-10 DIAGNOSIS — E038 Other specified hypothyroidism: Secondary | ICD-10-CM | POA: Diagnosis not present

## 2021-11-10 DIAGNOSIS — G894 Chronic pain syndrome: Secondary | ICD-10-CM | POA: Diagnosis not present

## 2021-11-10 DIAGNOSIS — N183 Chronic kidney disease, stage 3 unspecified: Secondary | ICD-10-CM | POA: Diagnosis not present

## 2021-11-10 DIAGNOSIS — E063 Autoimmune thyroiditis: Secondary | ICD-10-CM | POA: Diagnosis not present

## 2021-11-10 DIAGNOSIS — L03039 Cellulitis of unspecified toe: Secondary | ICD-10-CM | POA: Diagnosis not present

## 2021-11-11 DIAGNOSIS — R531 Weakness: Secondary | ICD-10-CM | POA: Diagnosis not present

## 2021-11-11 DIAGNOSIS — R5381 Other malaise: Secondary | ICD-10-CM | POA: Diagnosis not present

## 2021-11-11 LAB — COMPREHENSIVE METABOLIC PANEL
ALT: 19 IU/L (ref 0–32)
AST: 20 IU/L (ref 0–40)
Albumin/Globulin Ratio: 1.4 (ref 1.2–2.2)
Albumin: 4 g/dL (ref 3.7–4.7)
Alkaline Phosphatase: 76 IU/L (ref 44–121)
BUN/Creatinine Ratio: 14 (ref 12–28)
BUN: 19 mg/dL (ref 8–27)
Bilirubin Total: 0.3 mg/dL (ref 0.0–1.2)
CO2: 22 mmol/L (ref 20–29)
Calcium: 9.5 mg/dL (ref 8.7–10.3)
Chloride: 101 mmol/L (ref 96–106)
Creatinine, Ser: 1.35 mg/dL — ABNORMAL HIGH (ref 0.57–1.00)
Globulin, Total: 2.8 g/dL (ref 1.5–4.5)
Glucose: 119 mg/dL — ABNORMAL HIGH (ref 70–99)
Potassium: 4.2 mmol/L (ref 3.5–5.2)
Sodium: 140 mmol/L (ref 134–144)
Total Protein: 6.8 g/dL (ref 6.0–8.5)
eGFR: 40 mL/min/{1.73_m2} — ABNORMAL LOW (ref >59)

## 2021-11-11 LAB — T4, FREE: Free T4: 1.43 ng/dL (ref 0.82–1.77)

## 2021-11-11 LAB — TSH: TSH: 6.05 u[IU]/mL — ABNORMAL HIGH (ref 0.450–4.500)

## 2021-11-14 DIAGNOSIS — E782 Mixed hyperlipidemia: Secondary | ICD-10-CM | POA: Diagnosis not present

## 2021-11-14 DIAGNOSIS — J449 Chronic obstructive pulmonary disease, unspecified: Secondary | ICD-10-CM | POA: Diagnosis not present

## 2021-11-18 ENCOUNTER — Ambulatory Visit (INDEPENDENT_AMBULATORY_CARE_PROVIDER_SITE_OTHER): Payer: Medicare Other | Admitting: Nurse Practitioner

## 2021-11-18 ENCOUNTER — Encounter: Payer: Self-pay | Admitting: Nurse Practitioner

## 2021-11-18 VITALS — BP 100/68 | HR 85

## 2021-11-18 DIAGNOSIS — E1165 Type 2 diabetes mellitus with hyperglycemia: Secondary | ICD-10-CM | POA: Diagnosis not present

## 2021-11-18 DIAGNOSIS — E038 Other specified hypothyroidism: Secondary | ICD-10-CM

## 2021-11-18 DIAGNOSIS — E063 Autoimmune thyroiditis: Secondary | ICD-10-CM | POA: Diagnosis not present

## 2021-11-18 DIAGNOSIS — E782 Mixed hyperlipidemia: Secondary | ICD-10-CM | POA: Diagnosis not present

## 2021-11-18 LAB — POCT GLYCOSYLATED HEMOGLOBIN (HGB A1C): HbA1c, POC (controlled diabetic range): 5.7 % (ref 0.0–7.0)

## 2021-11-18 NOTE — Progress Notes (Signed)
? ?                                                    Endocrinology Follow Up Note  ?     11/18/2021, 12:01 PM ? ? ?Subjective:  ? ? Patient ID: BERDELLA BACOT, female    DOB: 03-19-1946.  ?PAYTIENCE BURES is being seen in follow up after being seen in consultation for management of currently uncontrolled symptomatic diabetes requested by  Redmond School, MD. ? ? ?Past Medical History:  ?Diagnosis Date  ? Anxiety   ? Arthritis   ? Bilateral Knee DJD  ? Chronic pain   ? Chronically on opiate therapy   ? Clostridium difficile carrier   ? pt stated that she has intermittent sx  ? Clostridium difficile infection   ? Daytime somnolence   ? Frequent falls   ? Headache(784.0)   ? SINUS HEADACHE OCCASSIONALLY  ? Hypertension   ? Hypothyroidism   ? Multifactorial gait disorder   ? Osteoporosis   ? Right foot drop   ? Snoring   ? Stroke [facility])   ? Tobacco use   ? Vertigo   ? Weakness   ? ? ?Past Surgical History:  ?Procedure Laterality Date  ? CATARACT EXTRACTION W/PHACO Right 06/28/2016  ? Procedure: CATARACT EXTRACTION PHACO AND INTRAOCULAR LENS PLACEMENT (IOC);  Surgeon: Rutherford Guys, MD;  Location: [facility];  Service: Ophthalmology;  Laterality: Right;  CDE: 11.10  ? CATARACT EXTRACTION W/PHACO Left 07/26/2016  ? Procedure: CATARACT EXTRACTION PHACO AND INTRAOCULAR LENS PLACEMENT (IOC);  Surgeon: Rutherford Guys, MD;  Location: [facility];  Service: Ophthalmology;  Laterality: Left;  CDE: 8.88  ? JOINT REPLACEMENT  11/08/3010  ? right total knee  ? TOTAL KNEE ARTHROPLASTY  11/07/2011  ? Procedure: TOTAL KNEE ARTHROPLASTY;  Surgeon: Lorn Junes, MD;  Location: [place];  Service: Orthopedics;  Laterality: Left;  DR [place] THIS CASE  ? TOTAL KNEE ARTHROPLASTY Right 2011  ? TUBAL LIGATION  1982  ? ? ?Social History  ? ?Socioeconomic History  ? Marital status: Married  ?  Spouse name: Jeneen Rinks  ? Number of children: 4  ? Years of education: [employer]  ? Highest education level: Not on file   ?Occupational History  ? Not on file  ?Tobacco Use  ? Smoking status: Every Day  ?  Packs/day: 1.00  ?  Years: 48.00  ?  Pack years: 48.00  ?  Types: Cigarettes  ? Smokeless tobacco: Never  ? Tobacco comments:  ?  12/29/14 1/2 PPD  ?Vaping Use  ? Vaping Use: Never used  ?Substance and Sexual Activity  ? Alcohol use: No  ?  Alcohol/week: 0.0 standard drinks  ? Drug use: No  ? Sexual activity: Never  ?  Birth control/protection: Surgical  ?Other Topics Concern  ? Not on file  ?Social History Narrative  ? 1 cup of coffee a day   ? ?Social Determinants of Health  ? ?Financial Resource Strain: Not on file  ?Food Insecurity: Not on file  ?Transportation Needs: Not on file  ?Physical Activity: Not on file  ?Stress: Not on file  ?Social Connections: Not on file  ? ? ?Family History  ?Problem Relation Age of Onset  ? Heart attack Mother   ? COPD Father   ?  Arthritis Sister   ? Heart disease Brother   ? ? ?Outpatient Encounter Medications as of 11/18/2021  ?Medication Sig  ? ACCU-CHEK GUIDE test strip Use as instructed to check glucose twice daily, before breakfast and before bed. Dx Code E11.65  ? Accu-Chek Softclix Lancets lancets Use to check blood glucose twice daily.  ? alendronate (FOSAMAX) 70 MG tablet Take 70 mg by mouth once a week.  ? ALPRAZolam (XANAX) 1 MG tablet Take 1 mg by mouth at bedtime.  ? ANORO ELLIPTA 62.5-25 MCG/ACT AEPB Inhale 1 puff into the lungs daily.  ? aspirin 81 MG EC tablet Take 81 mg by mouth in the morning.  ? atorvastatin (LIPITOR) 20 MG tablet Take 20 mg by mouth daily at 12 noon.  ? blood glucose meter kit and supplies Dispense based on patient and insurance preference. Use up to four times daily as directed. (FOR ICD-10 E10.9, E11.9).  ? cephALEXin (KEFLEX) 500 MG capsule Take 500 mg by mouth every 12 (twelve) hours.  ? Cholecalciferol (VITAMIN D3) 50 MCG (2000 UT) capsule Take 2,000 Units by mouth daily.  ? furosemide (LASIX) 20 MG tablet Take 20 mg by mouth daily.  ?  HYDROcodone-acetaminophen (NORCO) 10-325 MG tablet Take 1 tablet by mouth every 4 (four) hours as needed.  ? Insulin Pen Needle (PEN NEEDLES 3/16") 31G X 5 MM MISC 100 each by Does not apply route 2 (two) times daily.  ? levalbuterol (XOPENEX HFA) 45 MCG/ACT inhaler Inhale 2 puffs into the lungs 2 (two) times daily.  ? levothyroxine (SYNTHROID) 75 MCG tablet Take 75 mcg by mouth daily before breakfast.  ? OXYGEN Inhale 1 L/min into the lungs daily.  ? RELION PEN NEEDLES 31G X 6 MM MISC Use to inject insulin once daily.  ? sulfamethoxazole-trimethoprim (BACTRIM) 400-80 MG tablet Take 1 tablet by mouth at bedtime.  ? triamcinolone cream (KENALOG) 0.1 % SMARTSIG:1 Application Topical 2-3 Times Daily  ? [DISCONTINUED] insulin degludec (TRESIBA FLEXTOUCH) 100 UNIT/ML FlexTouch Pen Inject 6 Units into the skin at bedtime.  ? ?No facility-administered encounter medications on file as of 11/18/2021.  ? ? ?ALLERGIES: ?Allergies  ?Allergen Reactions  ? Iron Other (See Comments)  ?  "upset stomach"  ? Oxycodone Other (See Comments)  ?  "CX HER TO FELL LIKE SHE IS OUT OF HER BODY"  ? Sudafed [Pseudoephedrine Hcl] Other (See Comments)  ?  "funny feeling"  ? ? ?VACCINATION STATUS: ? ?There is no immunization history on file for this patient. ? ?Diabetes ?She presents for her follow-up diabetic visit. She has type 2 diabetes mellitus. Her disease course has been improving. Hypoglycemia symptoms include sweats and tremors. There are no diabetic associated symptoms. Pertinent negatives for diabetes include no fatigue. There are no hypoglycemic complications. Symptoms are improving. Diabetic complications include nephropathy. Risk factors for coronary artery disease include diabetes mellitus, dyslipidemia, obesity, sedentary lifestyle, family history, tobacco exposure and post-menopausal. Current diabetic treatment includes insulin injections. She is compliant with treatment most of the time. Her weight is fluctuating minimally. She is  following a generally unhealthy diet. When asked about meal planning, she reported none. She has not had a previous visit with a dietitian. She never (unable to engage in routine physical activity due to mobility deficits) participates in exercise. Her home blood glucose trend is decreasing steadily. Her breakfast blood glucose range is generally 70-90 mg/dl. Her bedtime blood glucose range is generally 110-130 mg/dl. (She presents today, accompanied by her daughter and care attendant, with her  meter and logs showing at target glycemic profile overall with tight fasting readings at times.  Her POCT A1c today is 5.7%.  They have skipped insulin at times when glucose readings low to avoid hypoglycemia.  Analysis of her meter shows 7-day average of 117; 14-day average of 120; 30-day average of 126; 90-day average of 128.) An ACE inhibitor/angiotensin II receptor blocker is not being taken. She does not see a podiatrist.Eye exam is not current.  ?Hyperlipidemia ?This is a chronic problem. The current episode started more than 1 year ago. The problem is uncontrolled. Recent lipid tests were reviewed and are variable. Exacerbating diseases include chronic renal disease, diabetes, hypothyroidism and obesity. Factors aggravating her hyperlipidemia include smoking and fatty foods. Current antihyperlipidemic treatment includes statins. The current treatment provides mild improvement of lipids. Compliance problems include adherence to diet and adherence to exercise.  Risk factors for coronary artery disease include diabetes mellitus, dyslipidemia, family history, obesity, a sedentary lifestyle and post-menopausal.  ?Thyroid Problem ?Presents for initial visit. Symptoms include cold intolerance, depressed mood and tremors. Patient reports no fatigue. The symptoms have been worsening. Past treatments include levothyroxine (her thyroid hormone was stopped for unknown reasons). Her past medical history is significant for diabetes,  hyperlipidemia and obesity.  ? ? ?Review of systems ? ?Constitutional: + Minimally fluctuating body weight, current There is no height or weight on file to calculate BMI., no fatigue, no subjective hyperthermia, no subjective hyp

## 2021-11-18 NOTE — Patient Instructions (Signed)

## 2021-12-14 DIAGNOSIS — J449 Chronic obstructive pulmonary disease, unspecified: Secondary | ICD-10-CM | POA: Diagnosis not present

## 2021-12-14 DIAGNOSIS — M159 Polyosteoarthritis, unspecified: Secondary | ICD-10-CM | POA: Diagnosis not present

## 2021-12-14 DIAGNOSIS — G894 Chronic pain syndrome: Secondary | ICD-10-CM | POA: Diagnosis not present

## 2021-12-15 DIAGNOSIS — E782 Mixed hyperlipidemia: Secondary | ICD-10-CM | POA: Diagnosis not present

## 2021-12-15 DIAGNOSIS — J449 Chronic obstructive pulmonary disease, unspecified: Secondary | ICD-10-CM | POA: Diagnosis not present

## 2021-12-16 DIAGNOSIS — J9601 Acute respiratory failure with hypoxia: Secondary | ICD-10-CM | POA: Diagnosis not present

## 2021-12-16 DIAGNOSIS — U071 COVID-19: Secondary | ICD-10-CM | POA: Diagnosis not present

## 2021-12-16 DIAGNOSIS — J449 Chronic obstructive pulmonary disease, unspecified: Secondary | ICD-10-CM | POA: Diagnosis not present

## 2022-01-10 ENCOUNTER — Other Ambulatory Visit: Payer: Self-pay | Admitting: Nurse Practitioner

## 2022-01-14 DIAGNOSIS — E782 Mixed hyperlipidemia: Secondary | ICD-10-CM | POA: Diagnosis not present

## 2022-01-14 DIAGNOSIS — J449 Chronic obstructive pulmonary disease, unspecified: Secondary | ICD-10-CM | POA: Diagnosis not present

## 2022-01-28 DIAGNOSIS — J449 Chronic obstructive pulmonary disease, unspecified: Secondary | ICD-10-CM | POA: Diagnosis not present

## 2022-01-28 DIAGNOSIS — U071 COVID-19: Secondary | ICD-10-CM | POA: Diagnosis not present

## 2022-01-28 DIAGNOSIS — J9601 Acute respiratory failure with hypoxia: Secondary | ICD-10-CM | POA: Diagnosis not present

## 2022-02-10 DIAGNOSIS — G894 Chronic pain syndrome: Secondary | ICD-10-CM | POA: Diagnosis not present

## 2022-02-10 DIAGNOSIS — N183 Chronic kidney disease, stage 3 unspecified: Secondary | ICD-10-CM | POA: Diagnosis not present

## 2022-02-10 DIAGNOSIS — M159 Polyosteoarthritis, unspecified: Secondary | ICD-10-CM | POA: Diagnosis not present

## 2022-02-15 ENCOUNTER — Other Ambulatory Visit: Payer: Self-pay | Admitting: Nurse Practitioner

## 2022-02-15 DIAGNOSIS — E063 Autoimmune thyroiditis: Secondary | ICD-10-CM | POA: Diagnosis not present

## 2022-02-15 DIAGNOSIS — E038 Other specified hypothyroidism: Secondary | ICD-10-CM | POA: Diagnosis not present

## 2022-02-15 DIAGNOSIS — E1165 Type 2 diabetes mellitus with hyperglycemia: Secondary | ICD-10-CM | POA: Diagnosis not present

## 2022-02-16 LAB — COMPREHENSIVE METABOLIC PANEL
ALT: 22 IU/L (ref 0–32)
AST: 22 IU/L (ref 0–40)
Albumin/Globulin Ratio: 1.8 (ref 1.2–2.2)
Albumin: 4.2 g/dL (ref 3.8–4.8)
Alkaline Phosphatase: 75 IU/L (ref 44–121)
BUN/Creatinine Ratio: 14 (ref 12–28)
BUN: 17 mg/dL (ref 8–27)
Bilirubin Total: 0.3 mg/dL (ref 0.0–1.2)
CO2: 24 mmol/L (ref 20–29)
Calcium: 9.6 mg/dL (ref 8.7–10.3)
Chloride: 104 mmol/L (ref 96–106)
Creatinine, Ser: 1.21 mg/dL — ABNORMAL HIGH (ref 0.57–1.00)
Globulin, Total: 2.4 g/dL (ref 1.5–4.5)
Glucose: 129 mg/dL — ABNORMAL HIGH (ref 70–99)
Potassium: 4.1 mmol/L (ref 3.5–5.2)
Sodium: 142 mmol/L (ref 134–144)
Total Protein: 6.6 g/dL (ref 6.0–8.5)
eGFR: 46 mL/min/{1.73_m2} — ABNORMAL LOW (ref >59)

## 2022-02-16 LAB — LIPID PANEL
Chol/HDL Ratio: 4 ratio (ref 0.0–4.4)
Cholesterol, Total: 127 mg/dL (ref 100–199)
HDL: 32 mg/dL — ABNORMAL LOW
LDL Chol Calc (NIH): 69 mg/dL (ref 0–99)
Triglycerides: 149 mg/dL (ref 0–149)
VLDL Cholesterol Cal: 26 mg/dL (ref 5–40)

## 2022-02-16 LAB — T4, FREE: Free T4: 1.39 ng/dL (ref 0.82–1.77)

## 2022-02-16 LAB — TSH: TSH: 3.99 u[IU]/mL (ref 0.450–4.500)

## 2022-02-23 ENCOUNTER — Encounter: Payer: Self-pay | Admitting: Nurse Practitioner

## 2022-02-23 ENCOUNTER — Ambulatory Visit (INDEPENDENT_AMBULATORY_CARE_PROVIDER_SITE_OTHER): Payer: Medicare Other | Admitting: Nurse Practitioner

## 2022-02-23 VITALS — BP 100/64 | HR 71

## 2022-02-23 DIAGNOSIS — Z87440 Personal history of urinary (tract) infections: Secondary | ICD-10-CM | POA: Diagnosis not present

## 2022-02-23 DIAGNOSIS — E063 Autoimmune thyroiditis: Secondary | ICD-10-CM

## 2022-02-23 DIAGNOSIS — E782 Mixed hyperlipidemia: Secondary | ICD-10-CM

## 2022-02-23 DIAGNOSIS — N390 Urinary tract infection, site not specified: Secondary | ICD-10-CM | POA: Diagnosis not present

## 2022-02-23 DIAGNOSIS — E038 Other specified hypothyroidism: Secondary | ICD-10-CM | POA: Diagnosis not present

## 2022-02-23 DIAGNOSIS — N1831 Chronic kidney disease, stage 3a: Secondary | ICD-10-CM | POA: Diagnosis not present

## 2022-02-23 DIAGNOSIS — E1122 Type 2 diabetes mellitus with diabetic chronic kidney disease: Secondary | ICD-10-CM | POA: Diagnosis not present

## 2022-02-23 DIAGNOSIS — B3731 Acute candidiasis of vulva and vagina: Secondary | ICD-10-CM | POA: Diagnosis not present

## 2022-02-23 LAB — POCT GLYCOSYLATED HEMOGLOBIN (HGB A1C): HbA1c, POC (controlled diabetic range): 5.9 % (ref 0.0–7.0)

## 2022-02-23 NOTE — Progress Notes (Signed)
Endocrinology Follow Up Note       02/23/2022, 11:27 AM   Subjective:    Patient ID: Amanda Armstrong, female    DOB: Jan 21, 1946.  Amanda Armstrong is being seen in follow up after being seen in consultation for management of currently uncontrolled symptomatic diabetes requested by  Redmond School, MD.   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    pt stated that she has intermittent sx   Clostridium difficile infection    Daytime somnolence    Frequent falls    Headache(784.0)    SINUS HEADACHE OCCASSIONALLY   Hypertension    Hypothyroidism    Multifactorial gait disorder    Osteoporosis    Right foot drop    Snoring    Stroke ([place])    Tobacco use    Vertigo    Weakness     Past Surgical History:  Procedure Laterality Date   CATARACT EXTRACTION W/PHACO Right 06/28/2016   Procedure: CATARACT EXTRACTION PHACO AND INTRAOCULAR LENS PLACEMENT ([place]);  Surgeon: Rutherford Guys, MD;  Location: [facility];  Service: Ophthalmology;  Laterality: Right;  CDE: 11.10   CATARACT EXTRACTION W/PHACO Left 07/26/2016   Procedure: CATARACT EXTRACTION PHACO AND INTRAOCULAR LENS PLACEMENT ([facility]);  Surgeon: Rutherford Guys, MD;  Location: [facility];  Service: Ophthalmology;  Laterality: Left;  CDE: 8.88   JOINT REPLACEMENT  11/08/3010   right total knee   TOTAL KNEE ARTHROPLASTY  11/07/2011   Procedure: TOTAL KNEE ARTHROPLASTY;  Surgeon: Lorn Junes, MD;  Location: [facility];  Service: Orthopedics;  Laterality: Left;  DR Ephriam Knuckles 28 MINUTES FOR THIS CASE   TOTAL KNEE ARTHROPLASTY Right 2011   TUBAL LIGATION  1982    Social History   Socioeconomic History   Marital status: Married    Spouse name: Jeneen Rinks   Number of children: 4   Years of education: [employer]   Highest education level: Not on file   Occupational History   Not on file  Tobacco Use   Smoking status: Every Day    Packs/day: 1.00    Years: 48.00    Total pack years: 48.00    Types: Cigarettes   Smokeless tobacco: Never   Tobacco comments:    12/29/14 1/2 PPD  Vaping Use   Vaping Use: Never used  Substance and Sexual Activity   Alcohol use: No    Alcohol/week: 0.0 standard drinks of alcohol   Drug use: No   Sexual activity: Never    Birth control/protection: Surgical  Other Topics Concern   Not on file  Social History Narrative   1 cup of coffee a day    Social Determinants of Health   Financial Resource Strain: Not on file  Food Insecurity: Not on file  Transportation Needs: Not on file  Physical Activity: Not on file  Stress: Not on file  Social Connections: Not on file    Family History  Problem Relation Age of Onset   Heart attack Mother    COPD Father  Arthritis Sister    Heart disease Brother     Outpatient Encounter Medications as of 02/23/2022  Medication Sig   ACCU-CHEK GUIDE test strip USE TEST STRIP TO CHECK GLUCOSE TWICE DAILY BEFORE BREAKFAST AND  BEFORE BEDTIME   Accu-Chek Softclix Lancets lancets Use to check blood glucose twice daily.   alendronate (FOSAMAX) 70 MG tablet Take 70 mg by mouth once a week.   ALPRAZolam (XANAX) 1 MG tablet Take 1 mg by mouth at bedtime.   ANORO ELLIPTA 62.5-25 MCG/ACT AEPB Inhale 1 puff into the lungs daily.   aspirin 81 MG EC tablet Take 81 mg by mouth in the morning.   atorvastatin (LIPITOR) 20 MG tablet Take 20 mg by mouth daily at 12 noon.   blood glucose meter kit and supplies Dispense based on patient and insurance preference. Use up to four times daily as directed. (FOR ICD-10 E10.9, E11.9).   cephALEXin (KEFLEX) 500 MG capsule Take 500 mg by mouth every 12 (twelve) hours.   Cholecalciferol (VITAMIN D3) 50 MCG (2000 UT) capsule Take 2,000 Units by mouth daily.   furosemide (LASIX) 20 MG tablet Take 20 mg by mouth daily.    HYDROcodone-acetaminophen (NORCO) 10-325 MG tablet Take 1 tablet by mouth every 4 (four) hours as needed.   Insulin Pen Needle (PEN NEEDLES 3/16") 31G X 5 MM MISC 100 each by Does not apply route 2 (two) times daily.   levalbuterol (XOPENEX HFA) 45 MCG/ACT inhaler Inhale 2 puffs into the lungs 2 (two) times daily.   levothyroxine (SYNTHROID) 75 MCG tablet Take 75 mcg by mouth daily before breakfast.   OXYGEN Inhale 1 L/min into the lungs daily.   RELION PEN NEEDLES 31G X 6 MM MISC Use to inject insulin once daily.   sulfamethoxazole-trimethoprim (BACTRIM) 400-80 MG tablet Take 1 tablet by mouth at bedtime.   triamcinolone cream (KENALOG) 0.1 % SMARTSIG:1 Application Topical 2-3 Times Daily   No facility-administered encounter medications on file as of 02/23/2022.    ALLERGIES: Allergies  Allergen Reactions   Iron Other (See Comments)    "upset stomach"   Oxycodone Other (See Comments)    "CX HER TO FELL LIKE SHE IS OUT OF HER BODY"   Sudafed [Pseudoephedrine Hcl] Other (See Comments)    "funny feeling"    VACCINATION STATUS:  There is no immunization history on file for this patient.  Diabetes She presents for her follow-up diabetic visit. She has type 2 diabetes mellitus. Her disease course has been stable. There are no hypoglycemic associated symptoms. There are no diabetic associated symptoms. Pertinent negatives for diabetes include no fatigue. There are no hypoglycemic complications. Symptoms are stable. Diabetic complications include nephropathy. Risk factors for coronary artery disease include diabetes mellitus, dyslipidemia, obesity, sedentary lifestyle, family history, tobacco exposure and post-menopausal. When asked about current treatments, none were reported. She is following a generally unhealthy diet. When asked about meal planning, she reported none. She has not had a previous visit with a dietitian. She never (unable to engage in routine physical activity due to mobility  deficits) participates in exercise. Her overall blood glucose range is 110-130 mg/dl. (She presents today, accompanied by her daughter and care attendant, with her meter and logs showing at target glycemic profile overall.  She has done well with stopping the insulin.  Her POCT A1c today is 5.9%.) An ACE inhibitor/angiotensin II receptor blocker is not being taken. She does not see a podiatrist.Eye exam is not current.  Hyperlipidemia This is a chronic  problem. The current episode started more than 1 year ago. The problem is uncontrolled. Recent lipid tests were reviewed and are variable. Exacerbating diseases include chronic renal disease, diabetes, hypothyroidism and obesity. Factors aggravating her hyperlipidemia include smoking and fatty foods. Current antihyperlipidemic treatment includes statins. The current treatment provides mild improvement of lipids. Compliance problems include adherence to diet and adherence to exercise.  Risk factors for coronary artery disease include diabetes mellitus, dyslipidemia, family history, obesity, a sedentary lifestyle and post-menopausal.  Thyroid Problem Presents for initial visit. Symptoms include cold intolerance and depressed mood. Patient reports no fatigue. The symptoms have been worsening. Past treatments include levothyroxine (her thyroid hormone was stopped for unknown reasons). Her past medical history is significant for diabetes, hyperlipidemia and obesity.     Review of systems  Constitutional: + Minimally fluctuating body weight, current There is no height or weight on file to calculate BMI., no fatigue, no subjective hyperthermia, no subjective hypothermia Eyes: no blurry vision, no xerophthalmia ENT: no sore throat, no nodules palpated in throat, no dysphagia/odynophagia, no hoarseness Cardiovascular: no chest pain, no shortness of breath, no palpitations, no leg swelling Respiratory: no cough, COPD Gastrointestinal: no  nausea/vomiting/diarrhea Musculoskeletal: hx of chronic pain,  currently in wheelchair for deconditioning Skin: no rashes, no hyperemia Neurological: no tremors, no numbness, no tingling, no dizziness Psychiatric: no depression, no anxiety  Objective:     BP 100/64   Pulse 71   Wt Readings from Last 3 Encounters:  03/25/21 163 lb 12.8 oz (74.3 kg)  08/31/20 151 lb 9.6 oz (68.8 kg)  08/17/20 158 lb 6.4 oz (71.8 kg)     BP Readings from Last 3 Encounters:  02/23/22 100/64  11/18/21 100/68  07/21/21 102/64      Physical Exam- Limited  Constitutional:  There is no height or weight on file to calculate BMI. , not in acute distress, normal state of mind Eyes:  EOMI, no exophthalmos Neck: Supple Cardiovascular: RRR, no murmurs, rubs, or gallops, no edema Respiratory: Adequate breathing efforts, no crackles, rales, rhonchi, or wheezing Musculoskeletal: no gross deformities, strength intact in all four extremities, no gross restriction of joint movements, essentially WC bound due to deconditioning Skin:  no rashes, no hyperemia Neurological: no tremor with outstretched hands   Diabetic Foot Exam - Simple   Simple Foot Form Diabetic Foot exam was performed with the following findings: Yes 02/23/2022 11:23 AM  Visual Inspection No deformities, no ulcerations, no other skin breakdown bilaterally: Yes Sensation Testing Intact to touch and monofilament testing bilaterally: Yes Pulse Check Posterior Tibialis and Dorsalis pulse intact bilaterally: Yes Comments     CMP ( most recent) CMP     Component Value Date/Time   NA 142 02/15/2022 1343   K 4.1 02/15/2022 1343   CL 104 02/15/2022 1343   CO2 24 02/15/2022 1343   GLUCOSE 129 (H) 02/15/2022 1343   GLUCOSE 82 03/25/2021 0509   BUN 17 02/15/2022 1343   CREATININE 1.21 (H) 02/15/2022 1343   CALCIUM 9.6 02/15/2022 1343   PROT 6.6 02/15/2022 1343   ALBUMIN 4.2 02/15/2022 1343   AST 22 02/15/2022 1343   ALT 22 02/15/2022  1343   ALKPHOS 75 02/15/2022 1343   BILITOT 0.3 02/15/2022 1343   GFRNONAA 50 (L) 03/25/2021 0509   GFRAA 59.61 08/13/2020 0000     Diabetic Labs (most recent): Lab Results  Component Value Date   HGBA1C 5.9 02/23/2022   HGBA1C 5.7 11/18/2021   HGBA1C 5.7 07/21/2021  Lipid Panel ( most recent) Lipid Panel     Component Value Date/Time   CHOL 127 02/15/2022 1343   TRIG 149 02/15/2022 1343   HDL 32 (L) 02/15/2022 1343   CHOLHDL 4.0 02/15/2022 1343   CHOLHDL 5.1 03/23/2021 0441   VLDL 39 03/23/2021 0441   LDLCALC 69 02/15/2022 1343   LABVLDL 26 02/15/2022 1343      Lab Results  Component Value Date   TSH 3.990 02/15/2022   TSH 6.050 (H) 11/10/2021   TSH 5.580 (H) 07/16/2021   TSH 17.800 (H) 04/06/2021   TSH 0.440 08/03/2020   TSH 0.266 (L) 09/08/2015   TSH 0.696 08/27/2013   TSH 0.025 (L) 05/27/2013   FREET4 1.39 02/15/2022   FREET4 1.43 11/10/2021   FREET4 1.45 07/16/2021   FREET4 1.36 04/06/2021           Assessment & Plan:   1) Controlled type 2 diabetes mellitus ([place])  She presents today, accompanied by her daughter and care attendant, with her meter and logs showing at target glycemic profile overall.  She has done well with stopping the insulin.  Her POCT A1c today is 5.9%.  - SOHA THORUP has currently uncontrolled symptomatic type 2 DM since 76 years of age.   -Recent labs reviewed.  - I had a long discussion with her about the progressive nature of diabetes and the pathology behind its complications. -her diabetes is complicated by CKD stage 3, chronic smoking and she remains at a high risk for more acute and chronic complications which include CAD, CVA, CKD, retinopathy, and neuropathy. These are all discussed in detail with her.  - Nutritional counseling repeated at each appointment due to patients tendency to fall back in to old habits.  - The patient admits there is a room for improvement in their diet and drink choices. -   Suggestion is made for the patient to avoid simple carbohydrates from their diet including Cakes, Sweet Desserts / Pastries, Ice Cream, Soda (diet and regular), Sweet Tea, Candies, Chips, Cookies, Sweet Pastries, Store Bought Juices, Alcohol in Excess of 1-2 drinks a day, Artificial Sweeteners, Coffee Creamer, and "Sugar-free" Products. This will help patient to have stable blood glucose profile and potentially avoid unintended weight gain.   - I encouraged the patient to switch to unprocessed or minimally processed complex starch and increased protein intake (animal or plant source), fruits, and vegetables.   - Patient is advised to stick to a routine mealtimes to eat 3 meals a day and avoid unnecessary snacks (to snack only to correct hypoglycemia).  - she will be scheduled with Jearld Fenton, RDN, CDE for diabetes education.  - I have approached her with the following individualized plan to manage her diabetes and patient agrees:   Given her at goal glycemic profile without insulin, she can be kept off it for now.  -she is encouraged to continue monitoring glucose if she wishes once daily.   - she is not a candidate for Metformin, SGLT2i due to concurrent renal insufficiency.  - she is not a candidate for incretin therapy due to heavy smoking history increasing her risk of pancreatitis.  - Specific targets for  A1c; LDL, HDL, and Triglycerides were discussed with the patient.  2) Blood Pressure /Hypertension:  her blood pressure is controlled to target.   she is advised to continue her current medications including Lasix 20 mg p.o. daily with breakfast.  3) Lipids/Hyperlipidemia:    Review of her recent lipid panel from  02/15/22 showed controlled LDL at 69.  she is advised to continue Lipitor 20 mg daily at bedtime.  Side effects and precautions discussed with her.  Will recheck lipid panel prior to next visit.  4)  Weight/Diet:  her There is no height or weight on file to calculate BMI.   -  clearly complicating her diabetes care.   she is a candidate for weight loss. I discussed with her the fact that loss of 5 - 10% of her  current body weight will have the most impact on her diabetes management.  Exercise, and detailed carbohydrates information provided  -  detailed on discharge instructions.  5) Hypothyroidism r/t Hashimoto's thyroiditis Her previsit thyroid function tests are consistent with appropriate hormone replacement.  She is advised to continue Levothyroxine 75 mcg po daily before breakfast.    - The correct intake of thyroid hormone (Levothyroxine, Synthroid), is on empty stomach first thing in the morning, with water, separated by at least 30 minutes from breakfast and other medications,  and separated by more than 4 hours from calcium, iron, multivitamins, acid reflux medications (PPIs).  - This medication is a life-long medication and will be needed to correct thyroid hormone imbalances for the rest of your life.  The dose may change from time to time, based on thyroid blood work.  - It is extremely important to be consistent taking this medication, near the same time each morning.  -AVOID TAKING PRODUCTS CONTAINING BIOTIN (commonly found in Hair, Skin, Nails vitamins) AS IT INTERFERES WITH THE VALIDITY OF THYROID FUNCTION BLOOD TESTS.  6) Chronic Care/Health Maintenance: -she is not on ACEI/ARB and is on Statin medications and is encouraged to initiate and continue to follow up with Ophthalmology, Dentist, Podiatrist at least yearly or according to recommendations, and advised to [facility] [place]. I have recommended yearly flu vaccine and pneumonia vaccine at least every 5 years; moderate intensity exercise for up to 150 minutes weekly; and sleep for at least 7 hours a day.  - she is advised to maintain close follow up with Redmond School, MD for primary care needs, as well as her other providers for optimal and coordinated care.      I spent 40 minutes in the care  of the patient today including review of labs from [facility], Lipids, Thyroid Function, Hematology (current and previous including abstractions from other facilities); face-to-face time discussing  her blood glucose readings/logs, discussing hypoglycemia and hyperglycemia episodes and symptoms, medications doses, her options of short and long term treatment based on the latest standards of care / guidelines;  discussion about incorporating lifestyle medicine;  and documenting the encounter. Risk reduction counseling performed per USPSTF guidelines to reduce obesity and cardiovascular risk factors.     Please refer to Patient Instructions for Blood Glucose Monitoring and Insulin/Medications Dosing Guide"  in media tab for additional information. Please  also refer to " Patient Self Inventory" in the Media  tab for reviewed elements of pertinent patient history.  Aliene Altes participated in the discussions, expressed understanding, and voiced agreement with the above plans.  All questions were answered to her satisfaction. she is encouraged to contact clinic should she have any questions or concerns prior to her return visit.     Follow up plan: - Return in about 6 months (around 08/26/2022) for Diabetes F/U with A1c in office, Thyroid follow up, Previsit labs.    Rayetta Pigg, [facility] [address] [place], [place] [address] Phone: [phone] Fax:  [phone]  02/23/2022, 11:27 AM

## 2022-02-23 NOTE — Patient Instructions (Signed)

## 2022-02-28 DIAGNOSIS — U071 COVID-19: Secondary | ICD-10-CM | POA: Diagnosis not present

## 2022-02-28 DIAGNOSIS — J449 Chronic obstructive pulmonary disease, unspecified: Secondary | ICD-10-CM | POA: Diagnosis not present

## 2022-02-28 DIAGNOSIS — J9601 Acute respiratory failure with hypoxia: Secondary | ICD-10-CM | POA: Diagnosis not present

## 2022-03-08 DIAGNOSIS — M159 Polyosteoarthritis, unspecified: Secondary | ICD-10-CM | POA: Diagnosis not present

## 2022-03-08 DIAGNOSIS — E063 Autoimmune thyroiditis: Secondary | ICD-10-CM | POA: Diagnosis not present

## 2022-03-08 DIAGNOSIS — N183 Chronic kidney disease, stage 3 unspecified: Secondary | ICD-10-CM | POA: Diagnosis not present

## 2022-03-08 DIAGNOSIS — G894 Chronic pain syndrome: Secondary | ICD-10-CM | POA: Diagnosis not present

## 2022-03-31 DIAGNOSIS — J449 Chronic obstructive pulmonary disease, unspecified: Secondary | ICD-10-CM | POA: Diagnosis not present

## 2022-03-31 DIAGNOSIS — U071 COVID-19: Secondary | ICD-10-CM | POA: Diagnosis not present

## 2022-03-31 DIAGNOSIS — J9601 Acute respiratory failure with hypoxia: Secondary | ICD-10-CM | POA: Diagnosis not present

## 2022-04-07 DIAGNOSIS — J449 Chronic obstructive pulmonary disease, unspecified: Secondary | ICD-10-CM | POA: Diagnosis not present

## 2022-04-07 DIAGNOSIS — G894 Chronic pain syndrome: Secondary | ICD-10-CM | POA: Diagnosis not present

## 2022-04-07 DIAGNOSIS — N183 Chronic kidney disease, stage 3 unspecified: Secondary | ICD-10-CM | POA: Diagnosis not present

## 2022-04-07 DIAGNOSIS — M159 Polyosteoarthritis, unspecified: Secondary | ICD-10-CM | POA: Diagnosis not present

## 2022-04-30 DIAGNOSIS — J9601 Acute respiratory failure with hypoxia: Secondary | ICD-10-CM | POA: Diagnosis not present

## 2022-04-30 DIAGNOSIS — J449 Chronic obstructive pulmonary disease, unspecified: Secondary | ICD-10-CM | POA: Diagnosis not present

## 2022-04-30 DIAGNOSIS — U071 COVID-19: Secondary | ICD-10-CM | POA: Diagnosis not present

## 2022-05-12 DIAGNOSIS — M159 Polyosteoarthritis, unspecified: Secondary | ICD-10-CM | POA: Diagnosis not present

## 2022-05-12 DIAGNOSIS — N183 Chronic kidney disease, stage 3 unspecified: Secondary | ICD-10-CM | POA: Diagnosis not present

## 2022-05-12 DIAGNOSIS — J449 Chronic obstructive pulmonary disease, unspecified: Secondary | ICD-10-CM | POA: Diagnosis not present

## 2022-05-12 DIAGNOSIS — G894 Chronic pain syndrome: Secondary | ICD-10-CM | POA: Diagnosis not present

## 2022-05-15 ENCOUNTER — Other Ambulatory Visit: Payer: Self-pay | Admitting: Nurse Practitioner

## 2022-05-24 ENCOUNTER — Ambulatory Visit (INDEPENDENT_AMBULATORY_CARE_PROVIDER_SITE_OTHER): Payer: Medicare Other | Admitting: Internal Medicine

## 2022-05-24 ENCOUNTER — Encounter: Payer: Self-pay | Admitting: Internal Medicine

## 2022-05-24 VITALS — BP 134/78 | HR 82 | Temp 98.2°F | Ht <= 58 in | Wt 163.0 lb

## 2022-05-24 DIAGNOSIS — F17210 Nicotine dependence, cigarettes, uncomplicated: Secondary | ICD-10-CM | POA: Diagnosis not present

## 2022-05-24 DIAGNOSIS — J9611 Chronic respiratory failure with hypoxia: Secondary | ICD-10-CM | POA: Diagnosis not present

## 2022-05-24 DIAGNOSIS — J449 Chronic obstructive pulmonary disease, unspecified: Secondary | ICD-10-CM

## 2022-05-24 NOTE — Patient Instructions (Signed)
The key is to stop smoking completely before smoking completely stops you!  No change in medications    Please schedule a follow up visit in 12 months but call sooner if needed

## 2022-05-24 NOTE — Progress Notes (Signed)
Amanda Armstrong, female    DOB: February 28, 1946,   MRN: [id number]   Brief patient profile:  28 yowf active smoker referred to pulmonary clinic in [place]  05/21/2021 by Amanda Armstrong  Amanda Armstrong  with baseline able to get around the house on 4 pronged walker limited by balance / strength s/p cva's     History of Present Illness  05/21/2021  Pulmonary/ 1st Armstrong eval/ Amanda Armstrong / Amanda Armstrong  Chief Complaint  Patient presents with   Consult    Pt saw Amanda Armstrong. Renae Armstrong. Dx with COPD. Has not seen pulmonary Amanda Armstrong since. COVID January 2022 since shes been on oxygen. 1.5L O2  cont.    Dyspnea:  PT coming out once a week / room to room limited / wearing 02 after ex Cough: congested cough / swallows mucus mostly  but what she does expectorate is clear Amanda Armstrong is am  Sleep: bed is flat with 2 pillows ok at night  SABA use: just anoro 02: 1.5 hs  and "all day " Rec Anoro one click each am  - two good drags 02  1.5 lpm at bedtime and during the day the goal is to keep the 0xygen saturation level above 90% at all times - you may need more 02 when you walk and a lot less when you sit to achieve that level. The key is to stop smoking completely before smoking completely stops you! Please schedule a follow up visit in 12 months but call sooner if needed     05/24/2022  f/u ov/Amanda Armstrong/Amanda Armstrong re: GOLD ? /group B copd  maint on Anoro  / actively smokes Chief Complaint  Patient presents with   Follow-up    Breathing doing well   Dyspnea:  limited by balance/strength >  sob  Cough: none now Sleeping: flat bed, lots of pillows  SABA use: doesn't have one (says never did )  02: 1.5 hs and prn daytime  Covid status: vax all but newest     No obvious day to day or daytime variability or assoc excess/ purulent sputum or mucus plugs or hemoptysis or cp or chest tightness, subjective wheeze or overt sinus or hb symptoms.   Sleeping as above without nocturnal  or early am exacerbation  of respiratory  c/o's or  need for noct saba. Also denies any obvious fluctuation of symptoms with weather or environmental changes or other aggravating or alleviating factors except as outlined above   No unusual exposure hx or h/o childhood pna/ asthma or knowledge of premature birth.  Current Allergies, Complete Past Medical History, Past Surgical History, Family History, and Social History were reviewed in [employer] record.  ROS  The following are not active complaints unless bolded Hoarseness, sore throat, dysphagia, dental problems, itching, sneezing,  nasal congestion or discharge of excess mucus or purulent secretions, ear ache,   fever, chills, sweats, unintended wt loss or wt gain, classically pleuritic or exertional cp,  orthopnea pnd or arm/hand swelling  or leg swelling, presyncope, palpitations, abdominal pain, anorexia, nausea, vomiting, diarrhea  or change in bowel habits or change in bladder habits, change in stools or change in urine, dysuria, hematuria,  rash, arthralgias, visual complaints, headache, numbness, weakness or ataxia or problems with walking or coordination,  change in mood or  memory.        Current Meds  Medication Sig   ACCU-CHEK GUIDE test strip USE TEST STRIP TO CHECK GLUCOSE TWICE DAILY BEFORE BREAKFAST AND  BEFORE BEDTIME  Accu-Chek Softclix Lancets lancets USE 1 LANCET TO CHECK GLUCOSE TWICE DAILY   alendronate (FOSAMAX) 70 MG tablet Take 70 mg by mouth once a week.   ALPRAZolam (XANAX) 1 MG tablet Take 1 mg by mouth at bedtime.   ANORO ELLIPTA 62.5-25 MCG/ACT AEPB Inhale 1 puff into the lungs daily.   aspirin 81 MG EC tablet Take 81 mg by mouth in the morning.   atorvastatin (LIPITOR) 20 MG tablet Take 20 mg by mouth daily at 12 noon.   blood glucose meter kit and supplies Dispense based on patient and insurance preference. Use up to four times daily as directed. (FOR ICD-10 E10.9, E11.9).   cephALEXin (KEFLEX) 500 MG capsule Take 500 mg by mouth every 12  (twelve) hours.   Cholecalciferol (VITAMIN D3) 50 MCG (2000 UT) capsule Take 2,000 Units by mouth daily.   furosemide (LASIX) 20 MG tablet Take 20 mg by mouth daily.   HYDROcodone-acetaminophen (NORCO) 10-325 MG tablet Take 1 tablet by mouth every 4 (four) hours as needed.   Insulin Pen Needle (PEN NEEDLES 3/16") 31G X 5 MM MISC 100 each by Does not apply route 2 (two) times daily.   levalbuterol (XOPENEX HFA) 45 MCG/ACT inhaler Inhale 2 puffs into the lungs 2 (two) times daily.   levothyroxine (SYNTHROID) 75 MCG tablet Take 75 mcg by mouth daily before breakfast.   OXYGEN Inhale 1 L/min into the lungs daily.   RELION PEN NEEDLES 31G X 6 MM MISC Use to inject insulin once daily.   sulfamethoxazole-trimethoprim (BACTRIM) 400-80 MG tablet Take 1 tablet by mouth at bedtime.                   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    pt stated that she has intermittent sx   Clostridium difficile infection    Daytime somnolence    Frequent falls    Headache(784.0)    SINUS HEADACHE OCCASSIONALLY   Hypertension    Hypothyroidism    Multifactorial gait disorder    Osteoporosis    Right foot drop    Snoring    Stroke ([place])    Tobacco use    Vertigo    Weakness       Objective:       Wt Readings from Last 3 Encounters:  05/24/22 163 lb (73.9 kg)  03/25/21 163 lb 12.8 oz (74.3 kg)  08/31/20 151 lb 9.6 oz (68.8 kg)      Vital signs reviewed  05/24/2022  - Note at rest 02 sats  94% on RA    General appearance:    w/c bound frail elderly wf being kept in chair with mid trunk belt      HEENT : Oropharynx  clear   Nasal turbinates nl   NECK :  without  apparent JVD/ palpable Nodes/TM    LUNGS: no acc muscle use,  Mild barrel  contour chest wall with bilateral  Distant bs s audible wheeze and  without cough on insp or exp maneuvers  and mild   Hyperresonant  to  percussion bilaterally     CV:  RRR  no s3 or murmur or increase in P2, and no edema   ABD:  soft and nontender    MS:  xt warm without deformities Or obvious joint restrictions  calf tenderness, cyanosis or clubbing     SKIN: warm and dry without lesions    NEURO:  alert, approp, nl sensorium with  no motor or cerebellar deficits apparent.       Cxr rec 05/21/2021 but declined due to transportation issues> same 05/24/2022          Assessment

## 2022-05-24 NOTE — Assessment & Plan Note (Addendum)
Placed on 19 Jul 2020 p covid  - sats RA at rst = 96% 05/21/2021 so rec titrate > 90% with ex   94 % at rest and using 1.5 lpm hs and prn  Make sure you check your oxygen saturation  AT  your highest level of activity (not after you stop)   to be sure it stays over 90% and adjust  02 flow upward to maintain this level if needed but remember to turn it back to previous settings when you stop (to conserve your supply).          Each maintenance medication was reviewed in detail including emphasizing most importantly the difference between maintenance and prns and under what circumstances the prns are to be triggered using an action plan format where appropriate.  Total time for H and P, chart review, counseling, reviewing dpi/elipta and 02  device(s) and generating customized AVS unique to this office visit / same day charting = 93min

## 2022-05-24 NOTE — Assessment & Plan Note (Signed)
Active smoker - 05/21/2021  After extensive coaching inhaler device,  effectiveness =    75% with dpi   - The proper method of use, as well as anticipated side effects, of a metered-dose inhaler were discussed and demonstrated to the patient using teach back method for dpi   Pt is Group B in terms of symptom/risk and laba/lama therefore appropriate rx at this point >>>  anoro approp and f/u yearly, sooner prn

## 2022-05-24 NOTE — Assessment & Plan Note (Signed)

## 2022-05-31 DIAGNOSIS — J9601 Acute respiratory failure with hypoxia: Secondary | ICD-10-CM | POA: Diagnosis not present

## 2022-05-31 DIAGNOSIS — J449 Chronic obstructive pulmonary disease, unspecified: Secondary | ICD-10-CM | POA: Diagnosis not present

## 2022-05-31 DIAGNOSIS — U071 COVID-19: Secondary | ICD-10-CM | POA: Diagnosis not present

## 2022-06-06 DIAGNOSIS — R5381 Other malaise: Secondary | ICD-10-CM | POA: Diagnosis not present

## 2022-06-06 DIAGNOSIS — M159 Polyosteoarthritis, unspecified: Secondary | ICD-10-CM | POA: Diagnosis not present

## 2022-06-06 DIAGNOSIS — J449 Chronic obstructive pulmonary disease, unspecified: Secondary | ICD-10-CM | POA: Diagnosis not present

## 2022-06-06 DIAGNOSIS — N183 Chronic kidney disease, stage 3 unspecified: Secondary | ICD-10-CM | POA: Diagnosis not present

## 2022-06-06 DIAGNOSIS — G894 Chronic pain syndrome: Secondary | ICD-10-CM | POA: Diagnosis not present

## 2022-06-21 DIAGNOSIS — M159 Polyosteoarthritis, unspecified: Secondary | ICD-10-CM | POA: Diagnosis not present

## 2022-06-21 DIAGNOSIS — G894 Chronic pain syndrome: Secondary | ICD-10-CM | POA: Diagnosis not present

## 2022-06-21 DIAGNOSIS — R739 Hyperglycemia, unspecified: Secondary | ICD-10-CM | POA: Diagnosis not present

## 2022-06-21 DIAGNOSIS — I679 Cerebrovascular disease, unspecified: Secondary | ICD-10-CM | POA: Diagnosis not present

## 2022-06-21 DIAGNOSIS — M810 Age-related osteoporosis without current pathological fracture: Secondary | ICD-10-CM | POA: Diagnosis not present

## 2022-06-21 DIAGNOSIS — J449 Chronic obstructive pulmonary disease, unspecified: Secondary | ICD-10-CM | POA: Diagnosis not present

## 2022-06-21 DIAGNOSIS — R5381 Other malaise: Secondary | ICD-10-CM | POA: Diagnosis not present

## 2022-06-21 DIAGNOSIS — N183 Chronic kidney disease, stage 3 unspecified: Secondary | ICD-10-CM | POA: Diagnosis not present

## 2022-06-21 DIAGNOSIS — E782 Mixed hyperlipidemia: Secondary | ICD-10-CM | POA: Diagnosis not present

## 2022-06-21 DIAGNOSIS — E039 Hypothyroidism, unspecified: Secondary | ICD-10-CM | POA: Diagnosis not present

## 2022-06-23 DIAGNOSIS — I679 Cerebrovascular disease, unspecified: Secondary | ICD-10-CM | POA: Diagnosis not present

## 2022-06-23 DIAGNOSIS — E782 Mixed hyperlipidemia: Secondary | ICD-10-CM | POA: Diagnosis not present

## 2022-06-23 DIAGNOSIS — R739 Hyperglycemia, unspecified: Secondary | ICD-10-CM | POA: Diagnosis not present

## 2022-06-23 DIAGNOSIS — M159 Polyosteoarthritis, unspecified: Secondary | ICD-10-CM | POA: Diagnosis not present

## 2022-06-23 DIAGNOSIS — E039 Hypothyroidism, unspecified: Secondary | ICD-10-CM | POA: Diagnosis not present

## 2022-06-23 DIAGNOSIS — J449 Chronic obstructive pulmonary disease, unspecified: Secondary | ICD-10-CM | POA: Diagnosis not present

## 2022-06-23 DIAGNOSIS — N183 Chronic kidney disease, stage 3 unspecified: Secondary | ICD-10-CM | POA: Diagnosis not present

## 2022-06-23 DIAGNOSIS — R5381 Other malaise: Secondary | ICD-10-CM | POA: Diagnosis not present

## 2022-06-23 DIAGNOSIS — G894 Chronic pain syndrome: Secondary | ICD-10-CM | POA: Diagnosis not present

## 2022-06-23 DIAGNOSIS — M810 Age-related osteoporosis without current pathological fracture: Secondary | ICD-10-CM | POA: Diagnosis not present

## 2022-06-27 DIAGNOSIS — M810 Age-related osteoporosis without current pathological fracture: Secondary | ICD-10-CM | POA: Diagnosis not present

## 2022-06-27 DIAGNOSIS — N183 Chronic kidney disease, stage 3 unspecified: Secondary | ICD-10-CM | POA: Diagnosis not present

## 2022-06-27 DIAGNOSIS — R5381 Other malaise: Secondary | ICD-10-CM | POA: Diagnosis not present

## 2022-06-27 DIAGNOSIS — J449 Chronic obstructive pulmonary disease, unspecified: Secondary | ICD-10-CM | POA: Diagnosis not present

## 2022-06-27 DIAGNOSIS — M159 Polyosteoarthritis, unspecified: Secondary | ICD-10-CM | POA: Diagnosis not present

## 2022-06-27 DIAGNOSIS — E039 Hypothyroidism, unspecified: Secondary | ICD-10-CM | POA: Diagnosis not present

## 2022-06-27 DIAGNOSIS — E782 Mixed hyperlipidemia: Secondary | ICD-10-CM | POA: Diagnosis not present

## 2022-06-27 DIAGNOSIS — I679 Cerebrovascular disease, unspecified: Secondary | ICD-10-CM | POA: Diagnosis not present

## 2022-06-27 DIAGNOSIS — G894 Chronic pain syndrome: Secondary | ICD-10-CM | POA: Diagnosis not present

## 2022-06-27 DIAGNOSIS — R739 Hyperglycemia, unspecified: Secondary | ICD-10-CM | POA: Diagnosis not present

## 2022-06-28 DIAGNOSIS — R5381 Other malaise: Secondary | ICD-10-CM | POA: Diagnosis not present

## 2022-06-28 DIAGNOSIS — G894 Chronic pain syndrome: Secondary | ICD-10-CM | POA: Diagnosis not present

## 2022-06-28 DIAGNOSIS — J449 Chronic obstructive pulmonary disease, unspecified: Secondary | ICD-10-CM | POA: Diagnosis not present

## 2022-06-28 DIAGNOSIS — M159 Polyosteoarthritis, unspecified: Secondary | ICD-10-CM | POA: Diagnosis not present

## 2022-06-30 DIAGNOSIS — R5381 Other malaise: Secondary | ICD-10-CM | POA: Diagnosis not present

## 2022-06-30 DIAGNOSIS — M159 Polyosteoarthritis, unspecified: Secondary | ICD-10-CM | POA: Diagnosis not present

## 2022-06-30 DIAGNOSIS — M810 Age-related osteoporosis without current pathological fracture: Secondary | ICD-10-CM | POA: Diagnosis not present

## 2022-06-30 DIAGNOSIS — U071 COVID-19: Secondary | ICD-10-CM | POA: Diagnosis not present

## 2022-06-30 DIAGNOSIS — R739 Hyperglycemia, unspecified: Secondary | ICD-10-CM | POA: Diagnosis not present

## 2022-06-30 DIAGNOSIS — I679 Cerebrovascular disease, unspecified: Secondary | ICD-10-CM | POA: Diagnosis not present

## 2022-06-30 DIAGNOSIS — E039 Hypothyroidism, unspecified: Secondary | ICD-10-CM | POA: Diagnosis not present

## 2022-06-30 DIAGNOSIS — E782 Mixed hyperlipidemia: Secondary | ICD-10-CM | POA: Diagnosis not present

## 2022-06-30 DIAGNOSIS — J9601 Acute respiratory failure with hypoxia: Secondary | ICD-10-CM | POA: Diagnosis not present

## 2022-06-30 DIAGNOSIS — G894 Chronic pain syndrome: Secondary | ICD-10-CM | POA: Diagnosis not present

## 2022-06-30 DIAGNOSIS — N183 Chronic kidney disease, stage 3 unspecified: Secondary | ICD-10-CM | POA: Diagnosis not present

## 2022-06-30 DIAGNOSIS — J449 Chronic obstructive pulmonary disease, unspecified: Secondary | ICD-10-CM | POA: Diagnosis not present

## 2022-07-03 ENCOUNTER — Other Ambulatory Visit: Payer: Self-pay

## 2022-07-03 ENCOUNTER — Encounter (HOSPITAL_COMMUNITY): Payer: Self-pay

## 2022-07-03 ENCOUNTER — Inpatient Hospital Stay (HOSPITAL_COMMUNITY)
Admission: EM | Admit: 2022-07-03 | Discharge: 2022-07-06 | DRG: 690 | Disposition: A | Discharge status: Home Health | Payer: Medicare Other | Attending: Internal Medicine | Admitting: Internal Medicine

## 2022-07-03 ENCOUNTER — Emergency Department (HOSPITAL_COMMUNITY): Payer: Medicare Other

## 2022-07-03 DIAGNOSIS — Z743 Need for continuous supervision: Secondary | ICD-10-CM | POA: Diagnosis not present

## 2022-07-03 DIAGNOSIS — G8929 Other chronic pain: Secondary | ICD-10-CM | POA: Diagnosis present

## 2022-07-03 DIAGNOSIS — N3000 Acute cystitis without hematuria: Principal | ICD-10-CM | POA: Diagnosis present

## 2022-07-03 DIAGNOSIS — F17210 Nicotine dependence, cigarettes, uncomplicated: Secondary | ICD-10-CM | POA: Diagnosis present

## 2022-07-03 DIAGNOSIS — Z79899 Other long term (current) drug therapy: Secondary | ICD-10-CM | POA: Diagnosis not present

## 2022-07-03 DIAGNOSIS — R509 Fever, unspecified: Secondary | ICD-10-CM | POA: Diagnosis not present

## 2022-07-03 DIAGNOSIS — Z8619 Personal history of other infectious and parasitic diseases: Secondary | ICD-10-CM

## 2022-07-03 DIAGNOSIS — E1122 Type 2 diabetes mellitus with diabetic chronic kidney disease: Secondary | ICD-10-CM | POA: Diagnosis not present

## 2022-07-03 DIAGNOSIS — Z20822 Contact with and (suspected) exposure to covid-19: Secondary | ICD-10-CM | POA: Diagnosis present

## 2022-07-03 DIAGNOSIS — M170 Bilateral primary osteoarthritis of knee: Secondary | ICD-10-CM | POA: Diagnosis present

## 2022-07-03 DIAGNOSIS — N1831 Chronic kidney disease, stage 3a: Secondary | ICD-10-CM | POA: Diagnosis not present

## 2022-07-03 DIAGNOSIS — E119 Type 2 diabetes mellitus without complications: Secondary | ICD-10-CM | POA: Diagnosis not present

## 2022-07-03 DIAGNOSIS — M810 Age-related osteoporosis without current pathological fracture: Secondary | ICD-10-CM | POA: Diagnosis not present

## 2022-07-03 DIAGNOSIS — E039 Hypothyroidism, unspecified: Secondary | ICD-10-CM | POA: Diagnosis not present

## 2022-07-03 DIAGNOSIS — E860 Dehydration: Secondary | ICD-10-CM | POA: Diagnosis present

## 2022-07-03 DIAGNOSIS — R4182 Altered mental status, unspecified: Secondary | ICD-10-CM | POA: Diagnosis present

## 2022-07-03 DIAGNOSIS — D6959 Other secondary thrombocytopenia: Secondary | ICD-10-CM | POA: Diagnosis not present

## 2022-07-03 DIAGNOSIS — N179 Acute kidney failure, unspecified: Secondary | ICD-10-CM | POA: Diagnosis not present

## 2022-07-03 DIAGNOSIS — Z9842 Cataract extraction status, left eye: Secondary | ICD-10-CM

## 2022-07-03 DIAGNOSIS — Z961 Presence of intraocular lens: Secondary | ICD-10-CM | POA: Diagnosis present

## 2022-07-03 DIAGNOSIS — Z888 Allergy status to other drugs, medicaments and biological substances status: Secondary | ICD-10-CM

## 2022-07-03 DIAGNOSIS — Z825 Family history of asthma and other chronic lower respiratory diseases: Secondary | ICD-10-CM

## 2022-07-03 DIAGNOSIS — Z96651 Presence of right artificial knee joint: Secondary | ICD-10-CM | POA: Diagnosis present

## 2022-07-03 DIAGNOSIS — D649 Anemia, unspecified: Secondary | ICD-10-CM | POA: Diagnosis present

## 2022-07-03 DIAGNOSIS — Z885 Allergy status to narcotic agent status: Secondary | ICD-10-CM

## 2022-07-03 DIAGNOSIS — R0602 Shortness of breath: Secondary | ICD-10-CM | POA: Diagnosis not present

## 2022-07-03 DIAGNOSIS — Z8249 Family history of ischemic heart disease and other diseases of the circulatory system: Secondary | ICD-10-CM

## 2022-07-03 DIAGNOSIS — Z79891 Long term (current) use of opiate analgesic: Secondary | ICD-10-CM

## 2022-07-03 DIAGNOSIS — Z9851 Tubal ligation status: Secondary | ICD-10-CM

## 2022-07-03 DIAGNOSIS — J9611 Chronic respiratory failure with hypoxia: Secondary | ICD-10-CM | POA: Diagnosis present

## 2022-07-03 DIAGNOSIS — R410 Disorientation, unspecified: Principal | ICD-10-CM

## 2022-07-03 DIAGNOSIS — J9601 Acute respiratory failure with hypoxia: Secondary | ICD-10-CM | POA: Insufficient documentation

## 2022-07-03 DIAGNOSIS — Z794 Long term (current) use of insulin: Secondary | ICD-10-CM

## 2022-07-03 DIAGNOSIS — Z79890 Hormone replacement therapy (postmenopausal): Secondary | ICD-10-CM

## 2022-07-03 DIAGNOSIS — Z8673 Personal history of transient ischemic attack (TIA), and cerebral infarction without residual deficits: Secondary | ICD-10-CM | POA: Diagnosis not present

## 2022-07-03 DIAGNOSIS — Z221 Carrier of other intestinal infectious diseases: Secondary | ICD-10-CM

## 2022-07-03 DIAGNOSIS — Z9981 Dependence on supplemental oxygen: Secondary | ICD-10-CM | POA: Diagnosis not present

## 2022-07-03 DIAGNOSIS — R7989 Other specified abnormal findings of blood chemistry: Secondary | ICD-10-CM | POA: Diagnosis present

## 2022-07-03 DIAGNOSIS — Z9841 Cataract extraction status, right eye: Secondary | ICD-10-CM

## 2022-07-03 DIAGNOSIS — Z7982 Long term (current) use of aspirin: Secondary | ICD-10-CM

## 2022-07-03 DIAGNOSIS — Z720 Tobacco use: Secondary | ICD-10-CM | POA: Diagnosis not present

## 2022-07-03 DIAGNOSIS — I129 Hypertensive chronic kidney disease with stage 1 through stage 4 chronic kidney disease, or unspecified chronic kidney disease: Secondary | ICD-10-CM | POA: Diagnosis not present

## 2022-07-03 DIAGNOSIS — J449 Chronic obstructive pulmonary disease, unspecified: Secondary | ICD-10-CM | POA: Diagnosis not present

## 2022-07-03 DIAGNOSIS — R531 Weakness: Secondary | ICD-10-CM | POA: Diagnosis not present

## 2022-07-03 DIAGNOSIS — R109 Unspecified abdominal pain: Secondary | ICD-10-CM | POA: Diagnosis not present

## 2022-07-03 DIAGNOSIS — R404 Transient alteration of awareness: Secondary | ICD-10-CM | POA: Diagnosis not present

## 2022-07-03 DIAGNOSIS — Z9181 History of falling: Secondary | ICD-10-CM

## 2022-07-03 DIAGNOSIS — N189 Chronic kidney disease, unspecified: Secondary | ICD-10-CM | POA: Diagnosis not present

## 2022-07-03 DIAGNOSIS — Z8261 Family history of arthritis: Secondary | ICD-10-CM | POA: Diagnosis not present

## 2022-07-03 DIAGNOSIS — F419 Anxiety disorder, unspecified: Secondary | ICD-10-CM | POA: Diagnosis present

## 2022-07-03 DIAGNOSIS — N390 Urinary tract infection, site not specified: Secondary | ICD-10-CM | POA: Diagnosis present

## 2022-07-03 LAB — COMPREHENSIVE METABOLIC PANEL
ALT: 21 U/L (ref 0–44)
AST: 26 U/L (ref 15–41)
Albumin: 3.4 g/dL — ABNORMAL LOW (ref 3.5–5.0)
Alkaline Phosphatase: 53 U/L (ref 38–126)
Anion gap: 8 (ref 5–15)
BUN: 18 mg/dL (ref 8–23)
CO2: 23 mmol/L (ref 22–32)
Calcium: 8.7 mg/dL — ABNORMAL LOW (ref 8.9–10.3)
Chloride: 107 mmol/L (ref 98–111)
Creatinine, Ser: 1.62 mg/dL — ABNORMAL HIGH (ref 0.44–1.00)
GFR, Estimated: 32 mL/min — ABNORMAL LOW (ref >60)
Glucose, Bld: 109 mg/dL — ABNORMAL HIGH (ref 70–99)
Potassium: 3.6 mmol/L (ref 3.5–5.1)
Sodium: 138 mmol/L (ref 135–145)
Total Bilirubin: 0.6 mg/dL (ref 0.3–1.2)
Total Protein: 6.1 g/dL — ABNORMAL LOW (ref 6.5–8.1)

## 2022-07-03 LAB — CBC WITH DIFFERENTIAL/PLATELET
Abs Immature Granulocytes: 0.02 10*3/uL (ref 0.00–0.07)
Basophils Absolute: 0.1 10*3/uL (ref 0.0–0.1)
Basophils Relative: 1 %
Eosinophils Absolute: 0 10*3/uL (ref 0.0–0.5)
Eosinophils Relative: 0 %
HCT: 34.5 % — ABNORMAL LOW (ref 36.0–46.0)
Hemoglobin: 11.4 g/dL — ABNORMAL LOW (ref 12.0–15.0)
Immature Granulocytes: 0 %
Lymphocytes Relative: 12 %
Lymphs Abs: 0.9 10*3/uL (ref 0.7–4.0)
MCH: 30.1 pg (ref 26.0–34.0)
MCHC: 33 g/dL (ref 30.0–36.0)
MCV: 91 fL (ref 80.0–100.0)
Monocytes Absolute: 0.5 10*3/uL (ref 0.1–1.0)
Monocytes Relative: 7 %
Neutro Abs: 5.9 10*3/uL (ref 1.7–7.7)
Neutrophils Relative %: 80 %
Platelets: 178 10*3/uL (ref 150–400)
RBC: 3.79 MIL/uL — ABNORMAL LOW (ref 3.87–5.11)
RDW: 14.9 % (ref 11.5–15.5)
WBC: 7.4 10*3/uL (ref 4.0–10.5)
nRBC: 0 % (ref 0.0–0.2)

## 2022-07-03 LAB — PROTIME-INR
INR: 1.2 (ref 0.8–1.2)
Prothrombin Time: 15.4 seconds — ABNORMAL HIGH (ref 11.4–15.2)

## 2022-07-03 LAB — URINALYSIS, ROUTINE W REFLEX MICROSCOPIC
Bilirubin Urine: NEGATIVE
Glucose, UA: NEGATIVE mg/dL
Ketones, ur: NEGATIVE mg/dL
Nitrite: NEGATIVE
Protein, ur: 30 mg/dL — AB
Specific Gravity, Urine: 1.016 (ref 1.005–1.030)
WBC, UA: >50 WBC/hpf — ABNORMAL HIGH (ref 0–5)
pH: 6 (ref 5.0–8.0)

## 2022-07-03 LAB — RESP PANEL BY RT-PCR (RSV, FLU A&B, COVID)  RVPGX2
Influenza A by PCR: NEGATIVE
Influenza B by PCR: NEGATIVE
Resp Syncytial Virus by PCR: NEGATIVE
SARS Coronavirus 2 by RT PCR: NEGATIVE

## 2022-07-03 LAB — LACTIC ACID, PLASMA: Lactic Acid, Venous: 1.3 mmol/L (ref 0.5–1.9)

## 2022-07-03 MED ORDER — LACTATED RINGERS IV BOLUS (SEPSIS)
1000.0000 mL | Freq: Once | INTRAVENOUS | Status: AC
  Administered 2022-07-03: 1000 mL via INTRAVENOUS

## 2022-07-03 MED ORDER — ACETAMINOPHEN 325 MG PO TABS
650.0000 mg | ORAL_TABLET | Freq: Four times a day (QID) | ORAL | Status: DC | PRN
  Administered 2022-07-04: 650 mg via ORAL
  Filled 2022-07-03: qty 2

## 2022-07-03 MED ORDER — VANCOMYCIN HCL 1250 MG/250ML IV SOLN
1250.0000 mg | Freq: Once | INTRAVENOUS | Status: AC
  Administered 2022-07-03: 1250 mg via INTRAVENOUS
  Filled 2022-07-03: qty 250

## 2022-07-03 MED ORDER — SODIUM CHLORIDE 0.9 % IV SOLN
2.0000 g | Freq: Once | INTRAVENOUS | Status: AC
  Administered 2022-07-03: 2 g via INTRAVENOUS
  Filled 2022-07-03: qty 12.5

## 2022-07-03 MED ORDER — LACTATED RINGERS IV SOLN: INTRAVENOUS | Status: DC

## 2022-07-03 NOTE — ED Provider Notes (Signed)
I saw and evaluated the patient, reviewed the resident's note and I agree with the findings and plan.     Patient presented with weakness fever as well as some dysuria.  Notes cough congestion.  Had temperature to 102.4.  Patient's chest x-ray per interpretation showed no acute findings.  Due to altered mental status and fever patient started on sepsis protocol.  Will require admission   Lorre Nick, MD 07/03/22 2242

## 2022-07-03 NOTE — ED Triage Notes (Signed)
Pt BIB [facility] for AMS, abd pain, weakness, fever reported by husband. Family called multiple times and patient refused transport. This time EMS convinced patient to come. BP 116/62, HR 72, 96% 3L, CBG 136, RR 16, 102.50F axillary temp. 20 LAC

## 2022-07-03 NOTE — ED Provider Notes (Signed)
[facility] EMERGENCY DEPARTMENT Provider Note   CSN: [id number] Arrival date & time: 07/03/22  2147     History {Add pertinent medical, surgical, social history, OB history to HPI:1} Chief Complaint  Patient presents with   Altered Mental Status    [place] is a 76 y.o. female.   Altered Mental Status The patient is a 76 year old female with past medical history of anxiety, diabetes, HTN, COPD, tobacco use, presenting by EMS from home for altered mental status, abdominal pain, fever, and weakness.  EMS reported patient had a temperature     Home Medications Prior to Admission medications   Medication Sig Start Date End Date Taking? Authorizing Provider  ACCU-CHEK GUIDE test strip USE TEST STRIP TO CHECK GLUCOSE TWICE DAILY BEFORE BREAKFAST AND  BEFORE BEDTIME 02/15/22   Brita Romp, NP  Accu-Chek Softclix Lancets lancets USE 1 LANCET TO CHECK GLUCOSE TWICE DAILY 05/16/22   Brita Romp, NP  alendronate (FOSAMAX) 70 MG tablet Take 70 mg by mouth once a week. 03/18/18   [provider]  ALPRAZolam Duanne Moron) 1 MG tablet Take 1 mg by mouth at bedtime. 04/22/18   [provider]  Celedonio Miyamoto 62.5-25 MCG/ACT AEPB Inhale 1 puff into the lungs daily. 06/24/21   [provider]  aspirin 81 MG EC tablet Take 81 mg by mouth in the morning. 03/01/18   [provider]  atorvastatin (LIPITOR) 20 MG tablet Take 20 mg by mouth daily at 12 noon. 02/11/18   [provider]  blood glucose meter kit and supplies Dispense based on patient and insurance preference. Use up to four times daily as directed. (FOR ICD-10 E10.9, E11.9). 03/24/21   Amin, Jeanella Flattery, MD  cephALEXin (KEFLEX) 500 MG capsule Take 500 mg by mouth every 12 (twelve) hours. 11/11/21   [provider]  Cholecalciferol (VITAMIN D3) 50 MCG (2000 UT) capsule Take 2,000 Units by mouth daily.    [provider]  furosemide (LASIX) 20 MG tablet  Take 20 mg by mouth daily.    [provider]  HYDROcodone-acetaminophen (NORCO) 10-325 MG tablet Take 1 tablet by mouth every 4 (four) hours as needed. 12/02/20   [provider]  Insulin Pen Needle (PEN NEEDLES 3/16") 31G X 5 MM MISC 100 each by Does not apply route 2 (two) times daily. 03/24/21   Amin, Jeanella Flattery, MD  levothyroxine (SYNTHROID) 75 MCG tablet Take 75 mcg by mouth daily before breakfast.    [provider]  OXYGEN Inhale 1 L/min into the lungs daily.    [provider]  RELION PEN NEEDLES 31G X 6 MM MISC Use to inject insulin once daily. 07/27/21   Brita Romp, NP  sulfamethoxazole-trimethoprim (BACTRIM) 400-80 MG tablet Take 1 tablet by mouth at bedtime. 06/28/21   [provider]      Allergies    Iron, Oxycodone, and Sudafed [pseudoephedrine hcl]    Review of Systems   Review of Systems  Physical Exam Updated Vital Signs There were no vitals taken for this visit. Physical Exam  ED Results / Procedures / Treatments   Labs (all labs ordered are listed, but only abnormal results are displayed) Labs Reviewed - No data to display  EKG None  Radiology No results found.  Procedures Procedures  {Document cardiac monitor, telemetry assessment procedure when appropriate:1}  Medications Ordered in ED Medications - No data to display  ED Course/ Medical Decision Making/ A&P  Medical Decision Making  ***  {Document critical care time when appropriate:1} {Document review of labs and clinical decision tools ie heart score, Chads2Vasc2 etc:1}  {Document your independent review of radiology images, and any outside records:1} {Document your discussion with family members, caretakers, and with consultants:1} {Document social determinants of health affecting pt's care:1} {Document your decision making why or why not admission, treatments were needed:1} Final Clinical Impression(s) / ED  Diagnoses Final diagnoses:  None    Rx / DC Orders ED Discharge Orders     None

## 2022-07-03 NOTE — Sepsis Progress Note (Signed)
Elink following code sepsis °

## 2022-07-03 NOTE — ED Notes (Signed)
Amanda Armstrong, daughter, [phone] would like an update when available

## 2022-07-04 ENCOUNTER — Inpatient Hospital Stay (HOSPITAL_COMMUNITY): Payer: Medicare Other

## 2022-07-04 DIAGNOSIS — N189 Chronic kidney disease, unspecified: Secondary | ICD-10-CM | POA: Diagnosis not present

## 2022-07-04 DIAGNOSIS — Z720 Tobacco use: Secondary | ICD-10-CM

## 2022-07-04 DIAGNOSIS — J449 Chronic obstructive pulmonary disease, unspecified: Secondary | ICD-10-CM

## 2022-07-04 DIAGNOSIS — N3000 Acute cystitis without hematuria: Secondary | ICD-10-CM | POA: Diagnosis present

## 2022-07-04 DIAGNOSIS — E119 Type 2 diabetes mellitus without complications: Secondary | ICD-10-CM

## 2022-07-04 DIAGNOSIS — N179 Acute kidney failure, unspecified: Secondary | ICD-10-CM | POA: Diagnosis not present

## 2022-07-04 DIAGNOSIS — E860 Dehydration: Secondary | ICD-10-CM | POA: Diagnosis not present

## 2022-07-04 DIAGNOSIS — Z9981 Dependence on supplemental oxygen: Secondary | ICD-10-CM

## 2022-07-04 LAB — CBC
HCT: 35.4 % — ABNORMAL LOW (ref 36.0–46.0)
Hemoglobin: 11.4 g/dL — ABNORMAL LOW (ref 12.0–15.0)
MCH: 30.2 pg (ref 26.0–34.0)
MCHC: 32.2 g/dL (ref 30.0–36.0)
MCV: 93.7 fL (ref 80.0–100.0)
Platelets: 131 10*3/uL — ABNORMAL LOW (ref 150–400)
RBC: 3.78 MIL/uL — ABNORMAL LOW (ref 3.87–5.11)
RDW: 15.2 % (ref 11.5–15.5)
WBC: 5.8 10*3/uL (ref 4.0–10.5)
nRBC: 0 % (ref 0.0–0.2)

## 2022-07-04 LAB — BASIC METABOLIC PANEL
Anion gap: 9 (ref 5–15)
BUN: 14 mg/dL (ref 8–23)
CO2: 22 mmol/L (ref 22–32)
Calcium: 8.4 mg/dL — ABNORMAL LOW (ref 8.9–10.3)
Chloride: 106 mmol/L (ref 98–111)
Creatinine, Ser: 1.44 mg/dL — ABNORMAL HIGH (ref 0.44–1.00)
GFR, Estimated: 37 mL/min — ABNORMAL LOW (ref >60)
Glucose, Bld: 89 mg/dL (ref 70–99)
Potassium: 3.9 mmol/L (ref 3.5–5.1)
Sodium: 137 mmol/L (ref 135–145)

## 2022-07-04 MED ORDER — ENOXAPARIN SODIUM 40 MG/0.4ML IJ SOSY
40.0000 mg | PREFILLED_SYRINGE | Freq: Every day | INTRAMUSCULAR | Status: DC
  Administered 2022-07-04 – 2022-07-06 (×3): 40 mg via SUBCUTANEOUS
  Filled 2022-07-04 (×3): qty 0.4

## 2022-07-04 MED ORDER — ALBUTEROL SULFATE (2.5 MG/3ML) 0.083% IN NEBU: 2.5000 mg | INHALATION_SOLUTION | RESPIRATORY_TRACT | Status: DC | PRN

## 2022-07-04 MED ORDER — LEVOTHYROXINE SODIUM 75 MCG PO TABS
75.0000 ug | ORAL_TABLET | Freq: Every day | ORAL | Status: DC
  Administered 2022-07-04 – 2022-07-06 (×3): 75 ug via ORAL
  Filled 2022-07-04 (×3): qty 1

## 2022-07-04 MED ORDER — VITAMIN D 25 MCG (1000 UNIT) PO TABS
2000.0000 [IU] | ORAL_TABLET | Freq: Every day | ORAL | Status: DC
  Administered 2022-07-04 – 2022-07-06 (×3): 2000 [IU] via ORAL
  Filled 2022-07-04 (×3): qty 2

## 2022-07-04 MED ORDER — NICOTINE 14 MG/24HR TD PT24
14.0000 mg | MEDICATED_PATCH | Freq: Every day | TRANSDERMAL | Status: DC
  Administered 2022-07-04 – 2022-07-06 (×3): 14 mg via TRANSDERMAL
  Filled 2022-07-04 (×3): qty 1

## 2022-07-04 MED ORDER — SODIUM CHLORIDE 0.9 % IV SOLN: 1.0000 g | INTRAVENOUS | Status: DC

## 2022-07-04 MED ORDER — ACETAMINOPHEN 325 MG PO TABS
650.0000 mg | ORAL_TABLET | Freq: Four times a day (QID) | ORAL | Status: DC | PRN
  Administered 2022-07-04 – 2022-07-05 (×2): 650 mg via ORAL
  Filled 2022-07-04 (×2): qty 2

## 2022-07-04 MED ORDER — ALPRAZOLAM 0.5 MG PO TABS
1.0000 mg | ORAL_TABLET | Freq: Every day | ORAL | Status: DC
  Administered 2022-07-04 – 2022-07-05 (×2): 1 mg via ORAL
  Filled 2022-07-04 (×2): qty 2

## 2022-07-04 MED ORDER — ASPIRIN 81 MG PO TBEC
81.0000 mg | DELAYED_RELEASE_TABLET | Freq: Every morning | ORAL | Status: DC
  Administered 2022-07-04 – 2022-07-06 (×3): 81 mg via ORAL
  Filled 2022-07-04 (×3): qty 1

## 2022-07-04 MED ORDER — UMECLIDINIUM-VILANTEROL 62.5-25 MCG/ACT IN AEPB
1.0000 | INHALATION_SPRAY | Freq: Every day | RESPIRATORY_TRACT | Status: DC
  Administered 2022-07-04 – 2022-07-06 (×3): 1 via RESPIRATORY_TRACT
  Filled 2022-07-04: qty 14

## 2022-07-04 MED ORDER — ATORVASTATIN CALCIUM 10 MG PO TABS
20.0000 mg | ORAL_TABLET | Freq: Every day | ORAL | Status: DC
  Administered 2022-07-04 – 2022-07-06 (×3): 20 mg via ORAL
  Filled 2022-07-04 (×3): qty 2

## 2022-07-04 MED ORDER — SODIUM CHLORIDE 0.9 % IV SOLN
2.0000 g | INTRAVENOUS | Status: DC
  Administered 2022-07-04 – 2022-07-06 (×3): 2 g via INTRAVENOUS
  Filled 2022-07-04 (×3): qty 20

## 2022-07-04 MED ORDER — FUROSEMIDE 20 MG PO TABS
20.0000 mg | ORAL_TABLET | Freq: Every day | ORAL | Status: DC
  Administered 2022-07-05 – 2022-07-06 (×2): 20 mg via ORAL
  Filled 2022-07-04 (×3): qty 1

## 2022-07-04 NOTE — Assessment & Plan Note (Addendum)
-  With chronic hypoxemic respiratory failure. She reports being on 3 L at baseline but on recent pulmonology note it was documented to be 1.5L. Will continue to monitor with continuous pulse ox with goal 88-92% -Has some increased labored respiration but does not appear in acute exacerbation. Monitor on continuous pulse oximetry

## 2022-07-04 NOTE — ED Notes (Addendum)
Daughter Amanda Armstrong called for an update for the patient. Patients family is concerned due to not hearing from provider. Patient family given update by this paramedic but advised to wait for communication from the doc.

## 2022-07-04 NOTE — ED Notes (Signed)
ED TO INPATIENT HANDOFF REPORT  ED Nurse Name and Phone #:   S Name/Age/Gender Amanda Armstrong 76 y.o. female Room/Bed: 002C/002C  Code Status   Code Status: Full Code  Home/SNF/Other Home Patient oriented to: self, place, time, and situation Is this baseline? Yes   Triage Complete: Triage complete  Chief Complaint Acute respiratory failure with hypoxia (HCC) [J96.01] Acute cystitis [N30.00]  Triage Note Pt BIB [facility] for AMS, abd pain, weakness, fever reported by husband. Family called multiple times and patient refused transport. This time EMS convinced patient to come. BP 116/62, HR 72, 96% 3L, CBG 136, RR 16, 102.45F axillary temp. 20 LAC   Allergies Allergies  Allergen Reactions   Iron Other (See Comments)    "upset stomach"   Oxycodone Other (See Comments)    "CX HER TO FELL LIKE SHE IS OUT OF HER BODY"   Sudafed [Pseudoephedrine Hcl] Other (See Comments)    "funny feeling"    Level of Care/Admitting Diagnosis ED Disposition     ED Disposition  Admit   Condition  --   Comment  Hospital [facility]: [facility] [[id number]]  Level of Care: Med-Surg [16]  May admit patient to [facility] or [facility] if equivalent level of care is available:: No  Covid Evaluation: Asymptomatic - no recent exposure (last 10 days) testing not required  Diagnosis: Acute cystitis [595.0.ICD-9-CM]  Admitting Physician: Anselm Jungling [[id number]]  Attending Physician: Marcellus Scott D [[id number]]  Certification:: I certify this patient will need inpatient services for at least 2 midnights  Estimated Length of Stay: 2          B Medical/Surgery History 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    pt stated that she has intermittent sx   Clostridium difficile infection    Daytime somnolence    Frequent falls    Headache(784.0)    SINUS  HEADACHE OCCASSIONALLY   Hypertension    Hypothyroidism    Multifactorial gait disorder    Osteoporosis    Right foot drop    Snoring    Stroke (HCC)    Tobacco use    Vertigo    Weakness    Past Surgical History:  Procedure Laterality Date   CATARACT EXTRACTION W/PHACO Right 06/28/2016   Procedure: CATARACT EXTRACTION PHACO AND INTRAOCULAR LENS PLACEMENT (IOC);  Surgeon: Jethro Bolus, MD;  Location: [facility];  Service: Ophthalmology;  Laterality: Right;  CDE: 11.10   CATARACT EXTRACTION W/PHACO Left 07/26/2016   Procedure: CATARACT EXTRACTION PHACO AND INTRAOCULAR LENS PLACEMENT (IOC);  Surgeon: Jethro Bolus, MD;  Location: [facility];  Service: Ophthalmology;  Laterality: Left;  CDE: 8.88   JOINT REPLACEMENT  11/08/3010   right total knee   TOTAL KNEE ARTHROPLASTY  11/07/2011   Procedure: TOTAL KNEE ARTHROPLASTY;  Surgeon: Nilda Simmer, MD;  Location: [facility];  Service: Orthopedics;  Laterality: Left;  DR Thurston Hole WANTS 90 MINUTES FOR THIS CASE   TOTAL KNEE ARTHROPLASTY Right 2011   TUBAL LIGATION  1982     A IV Location/Drains/Wounds Patient Lines/Drains/Airways Status     Active Line/Drains/Airways     Name Placement date Placement time Site Days   Peripheral IV 07/03/22 20 G Left Antecubital 07/03/22  2151  Antecubital  1   Peripheral IV 07/03/22 20 G 1.16" Anterior;Distal;Left  Forearm 07/03/22  2231  Forearm  1            Intake/Output Last 24 hours  Intake/Output Summary (Last 24 hours) at 07/04/2022 1719 Last data filed at 07/04/2022 0107 Gross per 24 hour  Intake 1746.5 ml  Output --  Net 1746.5 ml    Labs/Imaging Results for orders placed or performed during the hospital encounter of 07/03/22 (from the past 48 hour(s))  Lactic acid, plasma     Status: None   Collection Time: 07/03/22 10:00 PM  Result Value Ref Range   Lactic Acid, Venous 1.3 0.5 - 1.9 mmol/L    Comment: Performed at [facility], [address] [place], [place] [address]  Culture,  blood (Routine x 2)     Status: None (Preliminary result)   Collection Time: 07/03/22 10:00 PM   Specimen: BLOOD  Result Value Ref Range   Specimen Description BLOOD LEFT ANTECUBITAL    Special Requests      BOTTLES DRAWN AEROBIC AND ANAEROBIC Blood Culture adequate volume   Culture      NO GROWTH < 12 HOURS Performed at [facility], [address] [place], [place] [address]    Report Status PENDING   Culture, blood (Routine x 2)     Status: None (Preliminary result)   Collection Time: 07/03/22 10:05 PM   Specimen: BLOOD  Result Value Ref Range   Specimen Description BLOOD BLOOD LEFT ARM    Special Requests      BOTTLES DRAWN AEROBIC AND ANAEROBIC Blood Culture adequate volume   Culture      NO GROWTH < 12 HOURS Performed at [facility], [address] [place], [place] [address]    Report Status PENDING   Comprehensive metabolic panel     Status: Abnormal   Collection Time: 07/03/22 10:06 PM  Result Value Ref Range   Sodium 138 135 - 145 mmol/L   Potassium 3.6 3.5 - 5.1 mmol/L   Chloride 107 98 - 111 mmol/L   CO2 23 22 - 32 mmol/L   Glucose, Bld 109 (H) 70 - 99 mg/dL    Comment: Glucose reference range applies only to samples taken after fasting for at least 8 hours.   BUN 18 8 - 23 mg/dL   Creatinine, Ser 9.141.62 (H) 0.44 - 1.00 mg/dL   Calcium 8.7 (L) 8.9 - 10.3 mg/dL   Total Protein 6.1 (L) 6.5 - 8.1 g/dL   Albumin 3.4 (L) 3.5 - 5.0 g/dL   AST 26 15 - 41 U/L   ALT 21 0 - 44 U/L   Alkaline Phosphatase 53 38 - 126 U/L   Total Bilirubin 0.6 0.3 - 1.2 mg/dL   GFR, Estimated 32 (L) >60 mL/min    Comment: (NOTE) Calculated using the CKD-EPI Creatinine Equation (2021)    Anion gap 8 5 - 15    Comment: Performed at [facility], [address] [place], [place] [address]  CBC with Differential     Status: Abnormal   Collection Time: 07/03/22 10:06 PM  Result Value Ref Range   WBC 7.4 4.0 - 10.5 K/uL   RBC 3.79 (L) 3.87 - 5.11 MIL/uL   Hemoglobin 11.4  (L) 12.0 - 15.0 g/dL   HCT 62.134.5 (L) 30.836.0 - 65.746.0 %   MCV 91.0 80.0 - 100.0 fL   MCH 30.1 26.0 - 34.0 pg   MCHC 33.0 30.0 - 36.0 g/dL   RDW 84.614.9 96.211.5 -  15.5 %   Platelets 178 150 - 400 K/uL   nRBC 0.0 0.0 - 0.2 %   Neutrophils Relative % 80 %   Neutro Abs 5.9 1.7 - 7.7 K/uL   Lymphocytes Relative 12 %   Lymphs Abs 0.9 0.7 - 4.0 K/uL   Monocytes Relative 7 %   Monocytes Absolute 0.5 0.1 - 1.0 K/uL   Eosinophils Relative 0 %   Eosinophils Absolute 0.0 0.0 - 0.5 K/uL   Basophils Relative 1 %   Basophils Absolute 0.1 0.0 - 0.1 K/uL   Immature Granulocytes 0 %   Abs Immature Granulocytes 0.02 0.00 - 0.07 K/uL    Comment: Performed at [facility], [address] [place], [place] [address]  Protime-INR     Status: Abnormal   Collection Time: 07/03/22 10:06 PM  Result Value Ref Range   Prothrombin Time 15.4 (H) 11.4 - 15.2 seconds   INR 1.2 0.8 - 1.2    Comment: (NOTE) INR goal varies based on device and disease states. Performed at [facility], [address] [place], [place] [address]   Resp panel by RT-PCR (RSV, Flu A&B, Covid) Anterior Nasal Swab     Status: None   Collection Time: 07/03/22 10:08 PM   Specimen: Anterior Nasal Swab  Result Value Ref Range   SARS Coronavirus 2 by RT PCR NEGATIVE NEGATIVE    Comment: (NOTE) SARS-CoV-2 target nucleic acids are NOT DETECTED.  The SARS-CoV-2 RNA is generally detectable in upper respiratory specimens during the acute phase of infection. The lowest concentration of SARS-CoV-2 viral copies this assay can detect is 138 copies/mL. A negative result does not preclude SARS-Cov-2 infection and should not be used as the sole basis for treatment or other patient management decisions. A negative result may occur with  improper specimen collection/handling, submission of specimen other than nasopharyngeal swab, presence of viral mutation(s) within the areas targeted by this assay, and inadequate number of viral copies(<138  copies/mL). A negative result must be combined with clinical observations, patient history, and epidemiological information. The expected result is Negative.  Fact Sheet for Patients:  BloggerCourse.com  Fact Sheet for Healthcare Providers:  SeriousBroker.it  This test is no t yet approved or cleared by the [place] FDA and  has been authorized for detection and/or diagnosis of SARS-CoV-2 by FDA under an Emergency Use Authorization (EUA). This EUA will remain  in effect (meaning this test can be used) for the duration of the COVID-19 declaration under Section 564(b)(1) of the Act, 21 U.S.C.section 360bbb-3(b)(1), unless the authorization is terminated  or revoked sooner.       Influenza A by PCR NEGATIVE NEGATIVE   Influenza B by PCR NEGATIVE NEGATIVE    Comment: (NOTE) The Xpert Xpress SARS-CoV-2/FLU/RSV plus assay is intended as an aid in the diagnosis of influenza from Nasopharyngeal swab specimens and should not be used as a sole basis for treatment. Nasal washings and aspirates are unacceptable for Xpert Xpress SARS-CoV-2/FLU/RSV testing.  Fact Sheet for Patients: BloggerCourse.com  Fact Sheet for Healthcare Providers: SeriousBroker.it  This test is not yet approved or cleared by the [place] FDA and has been authorized for detection and/or diagnosis of SARS-CoV-2 by FDA under an Emergency Use Authorization (EUA). This EUA will remain in effect (meaning this test can be used) for the duration of the COVID-19 declaration under Section 564(b)(1) of the Act, 21 U.S.C. section 360bbb-3(b)(1), unless the authorization is terminated or revoked.     Resp  Syncytial Virus by PCR NEGATIVE NEGATIVE    Comment: (NOTE) Fact Sheet for Patients: BloggerCourse.com  Fact Sheet for Healthcare  Providers: SeriousBroker.it  This test is not yet approved or cleared by the [place] FDA and has been authorized for detection and/or diagnosis of SARS-CoV-2 by FDA under an Emergency Use Authorization (EUA). This EUA will remain in effect (meaning this test can be used) for the duration of the COVID-19 declaration under Section 564(b)(1) of the Act, 21 U.S.C. section 360bbb-3(b)(1), unless the authorization is terminated or revoked.  Performed at [facility], [address] [place], [place] [address]   Urinalysis, Routine w reflex microscopic Urine, In & Out Cath     Status: Abnormal   Collection Time: 07/03/22 11:15 PM  Result Value Ref Range   Color, Urine Rexton Greulich (A) YELLOW    Comment: BIOCHEMICALS MAY BE AFFECTED BY COLOR   APPearance CLOUDY (A) CLEAR   Specific Gravity, Urine 1.016 1.005 - 1.030   pH 6.0 5.0 - 8.0   Glucose, UA NEGATIVE NEGATIVE mg/dL   Hgb urine dipstick SMALL (A) NEGATIVE   Bilirubin Urine NEGATIVE NEGATIVE   Ketones, ur NEGATIVE NEGATIVE mg/dL   Protein, ur 30 (A) NEGATIVE mg/dL   Nitrite NEGATIVE NEGATIVE   Leukocytes,Ua LARGE (A) NEGATIVE   RBC / HPF 11-20 0 - 5 RBC/hpf   WBC, UA >50 (H) 0 - 5 WBC/hpf   Bacteria, UA MANY (A) NONE SEEN   Squamous Epithelial / LPF 21-50 0 - 5   Mucus PRESENT     Comment: Performed at [facility], [address], [place], [place] [address]  CBC     Status: Abnormal   Collection Time: 07/04/22  5:00 AM  Result Value Ref Range   WBC 5.8 4.0 - 10.5 K/uL   RBC 3.78 (L) 3.87 - 5.11 MIL/uL   Hemoglobin 11.4 (L) 12.0 - 15.0 g/dL   HCT 27.0 (L) 78.6 - 75.4 %   MCV 93.7 80.0 - 100.0 fL   MCH 30.2 26.0 - 34.0 pg   MCHC 32.2 30.0 - 36.0 g/dL   RDW 49.2 01.0 - 07.1 %   Platelets 131 (L) 150 - 400 K/uL    Comment: REPEATED TO VERIFY   nRBC 0.0 0.0 - 0.2 %    Comment: Performed at [facility], [address] [place], [place] [address]  Basic metabolic panel     Status:  Abnormal   Collection Time: 07/04/22  5:00 AM  Result Value Ref Range   Sodium 137 135 - 145 mmol/L   Potassium 3.9 3.5 - 5.1 mmol/L   Chloride 106 98 - 111 mmol/L   CO2 22 22 - 32 mmol/L   Glucose, Bld 89 70 - 99 mg/dL    Comment: Glucose reference range applies only to samples taken after fasting for at least 8 hours.   BUN 14 8 - 23 mg/dL   Creatinine, Ser 8.83 (H) 0.44 - 1.00 mg/dL   Calcium 8.4 (L) 8.9 - 10.3 mg/dL   GFR, Estimated 37 (L) >60 mL/min    Comment: (NOTE) Calculated using the CKD-EPI Creatinine Equation (2021)    Anion gap 9 5 - 15    Comment: Performed at [facility], [address] [place], [place] [address]   DG Abd Portable 1V  Result Date: 07/04/2022 CLINICAL DATA:  Abdominal pain EXAM: PORTABLE ABDOMEN - 1 VIEW COMPARISON:  None Available. FINDINGS: Nonobstructive bowel gas pattern. Visualized osseous  structures are within normal limits. IMPRESSION: Negative. Electronically Signed   By: Charline Bills M.D.   On: 07/04/2022 00:49   DG Chest 2 View  Result Date: 07/03/2022 CLINICAL DATA:  Suspected sepsis.  Altered mental status. EXAM: CHEST - 2 VIEW COMPARISON:  Chest radiograph dated January 17, 220 FINDINGS: The heart size and mediastinal contours are within normal limits. Biapical pleural/parenchymal scarring. Chronic elevation of the right hemidiaphragm. Lungs are otherwise clear without evidence of acute pulmonary process. Bilateral glenohumeral and acromioclavicular osteoarthritis. Thoracic spondylosis. IMPRESSION: No active cardiopulmonary disease. Electronically Signed   By: Larose Hires D.O.   On: 07/03/2022 22:36    Pending Labs Unresulted Labs (From admission, onward)     Start     Ordered   07/05/22 0500  CBC  Tomorrow morning,   R        07/04/22 0738   07/05/22 0500  Basic metabolic panel  Tomorrow morning,   R        07/04/22 0738   07/04/22 1641  Urine Culture  (Urine Culture)  Add-on,   AD       Question:  Indication  Answer:   Suprapubic pain   07/04/22 1640   07/03/22 2200  Lactic acid, plasma  Now then every 2 hours,   R      07/03/22 2159            Vitals/Pain Today's Vitals   07/04/22 1204 07/04/22 1400 07/04/22 1430 07/04/22 1645  BP:  (!) 108/55 (!) 106/54   Pulse:  82 82   Resp:  (!) 21 (!) 38   Temp: 98.2 F (36.8 C)   99.9 F (37.7 C)  TempSrc: Oral   Oral  SpO2:  98% 99%   PainSc:        Isolation Precautions No active isolations  Medications Medications  lactated ringers infusion ( Intravenous Rate/Dose Verify 07/04/22 0929)  enoxaparin (LOVENOX) injection 40 mg (40 mg Subcutaneous Given 07/04/22 1126)  levothyroxine (SYNTHROID) tablet 75 mcg (75 mcg Oral Given 07/04/22 0743)  umeclidinium-vilanterol (ANORO ELLIPTA) 62.5-25 MCG/ACT 1 puff (1 puff Inhalation Given 07/04/22 1648)  acetaminophen (TYLENOL) tablet 650 mg (has no administration in time range)  cefTRIAXone (ROCEPHIN) 2 g in sodium chloride 0.9 % 100 mL IVPB (0 g Intravenous Stopped 07/04/22 1242)  albuterol (PROVENTIL) (2.5 MG/3ML) 0.083% nebulizer solution 2.5 mg (has no administration in time range)  ALPRAZolam (XANAX) tablet 1 mg (has no administration in time range)  aspirin EC tablet 81 mg (81 mg Oral Given 07/04/22 1646)  atorvastatin (LIPITOR) tablet 20 mg (20 mg Oral Given 07/04/22 1646)  cholecalciferol (VITAMIN D3) 25 MCG (1000 UNIT) tablet 2,000 Units (2,000 Units Oral Given 07/04/22 1647)  lactated ringers bolus 1,000 mL (0 mLs Intravenous Stopped 07/03/22 2300)  ceFEPIme (MAXIPIME) 2 g in sodium chloride 0.9 % 100 mL IVPB (0 g Intravenous Stopped 07/03/22 2330)  vancomycin (VANCOREADY) IVPB 1250 mg/250 mL (0 mg Intravenous Stopped 07/04/22 0107)    Mobility walks with device High fall risk   Focused Assessments Renal Assessment Handoff:  Hemodialysis Schedule:  Last Hemodialysis date and time:    Restricted appendage:    R Recommendations: See Admitting Provider Note  Report given to:    Additional Notes:

## 2022-07-04 NOTE — ED Notes (Signed)
Patient was repositioned, cleaned and purwick changed.

## 2022-07-04 NOTE — Progress Notes (Signed)
Pt brought up at shift change. PT placed in bed, 2LNC administered. Pt is A/Ox4, VSS taken, no complaints of pain. Will pass on care to night shift nurse.

## 2022-07-04 NOTE — Progress Notes (Signed)
PROGRESS NOTE   Amanda Armstrong  XQJ:[id number]    DOB: 08/08/1945    DOA: 07/03/2022  PCP: [name], MD   I have briefly reviewed patients previous medical records in [facility].  Chief Complaint  Patient presents with   Altered Mental Status    Brief Narrative:  76 year old married female, ambulates with the help of a cane, PMH of COPD, chronic respiratory failure with hypoxia on 2 L/min home oxygen, ongoing tobacco use, type II DM, hypothyroidism, presented to the ED on 07/03/2022 with complaints of worsening weakness and lower abdominal pain for a week.  Per family's report, had diarrhea for 2 days PTA but has not had a BM since ED.  In the ED, fever of 102.5 F.  Admitted for acute cystitis, dehydration and acute kidney injury.   Assessment & Plan:  Principal Problem:   UTI (urinary tract infection) Active Problems:   Hypothyroidism   Cigarette smoker   AKI (acute kidney injury) (HCC)   Type 2 diabetes mellitus (HCC)   COPD (chronic obstructive pulmonary disease) (HCC)   Chronic hypoxic respiratory failure, on home oxygen therapy (HCC)   Tobacco use  UTI (urinary tract infection)/acute cystitis Presented with fever of 102.5 F.  Per ED RN, in and out catheterization showed thick urine.  Urine microscopy grossly positive for UTI.  Received IV vancomycin and cefepime in ED.  Now on IV ceftriaxone pending urine culture results, continue.  Etiology of abdominal pain unclear, may be related to cystitis versus other etiologies.  No urinary retention.  KUB negative.  If abdominal pain does not improve or worsens, consider CT abdomen without contrast.  Lactate normal.  Dehydration: Secondary to UTI and fever.  Gentle IV fluids.  Oral intake as tolerated.  Acute kidney injury complicating stage IIIa CKD: Creatinine 1.21 in August 23 with a GFR of 46.  Presented with creatinine of 1.62.  Secondary to dehydration.  Gentle IV fluids and follow BMP daily.  Labs for today  have been ordered and pending.  COPD, chronic respiratory failure with hypoxia on 2 L/min home oxygen: Currently without clinical bronchospasm.  Titrate oxygen to maintain saturations between 88-92%.  Incentive spirometry and flutter valve.  Mild intermittent tachypnea may be due to fever.  Chest x-ray without acute findings.  Tobacco abuse: Smokes half pack per day.  Initially declined nicotine patch but now wants it.  Type 2 diabetes mellitus (HCC) -controlled. last A1C of 5.9   Hypothyroidism Continue levothyroxine  Anemia: Unclear etiology.  Baseline hemoglobin probably in the 13-14 range.?  Hemodilution.  No active bleeding reported.  Follow CBC.   There is no height or weight on file to calculate BMI.   DVT prophylaxis: enoxaparin (LOVENOX) injection 40 mg Start: 07/04/22 1000     Code Status: Full Code:  Family Communication: Daughter via phone Disposition:  Status is: Inpatient Remains inpatient appropriate because: Fever, acute cystitis, IV antibiotics, IV fluids for AKI     Consultants:     Procedures:     Antimicrobials:   As above   Subjective:  History as noted above.  Now states that she has had pain across lower abdomen for almost 2 to 3 months.  No nausea or vomiting.  Denies dysuria.  No chest pain or dyspnea.  Objective:   Vitals:   07/04/22 0117 07/04/22 0300 07/04/22 0400 07/04/22 0430  BP:  (!) 110/51 (!) 102/54 (!) 112/59  Pulse:  74 72 71  Resp:   (!) 26 20  Temp: (!) 100.4 F (38 C)   98.7 F (37.1 C)  TempSrc:      SpO2:  99% 100% 99%    General exam: Elderly female, moderately built and overweight, lying comfortably propped up in bed without distress. Respiratory system: Slightly harsh breath sounds with occasional basal crackles but otherwise clear to auscultation without wheezing or rhonchi.  No increased work of breathing. Cardiovascular system: S1 & S2 heard, RRR. No JVD, murmurs, rubs, gallops or clicks.  Trace bilateral ankle  edema.  Telemetry personally reviewed: Sinus rhythm. Gastrointestinal system: Abdomen is nondistended, soft.  Mild tenderness across the lower quadrants without peritoneal signs. No organomegaly or masses felt. Normal bowel sounds heard. Central nervous system: Alert and oriented x 2. No focal neurological deficits. Extremities: Symmetric 5 x 5 power. Skin: No rashes, lesions or ulcers Psychiatry: Judgement and insight appear somewhat impaired l. Mood & affect appropriate.     Data Reviewed:   I have personally reviewed following labs and imaging studies   CBC: Recent Labs  Lab 07/03/22 2206  WBC 7.4  NEUTROABS 5.9  HGB 11.4*  HCT 34.5*  MCV 91.0  PLT 178    Basic Metabolic Panel: Recent Labs  Lab 07/03/22 2206  NA 138  K 3.6  CL 107  CO2 23  GLUCOSE 109*  BUN 18  CREATININE 1.62*  CALCIUM 8.7*    Liver Function Tests: Recent Labs  Lab 07/03/22 2206  AST 26  ALT 21  ALKPHOS 53  BILITOT 0.6  PROT 6.1*  ALBUMIN 3.4*    CBG: No results for input(s): "GLUCAP" in the last 168 hours.  Microbiology Studies:   Recent Results (from the past 240 hour(s))  Culture, blood (Routine x 2)     Status: None (Preliminary result)   Collection Time: 07/03/22 10:00 PM   Specimen: BLOOD  Result Value Ref Range Status   Specimen Description BLOOD LEFT ANTECUBITAL  Final   Special Requests   Final    BOTTLES DRAWN AEROBIC AND ANAEROBIC Blood Culture adequate volume   Culture   Final    NO GROWTH < 12 HOURS Performed at [facility], [address], [place], [place] [address]    Report Status PENDING  Incomplete  Culture, blood (Routine x 2)     Status: None (Preliminary result)   Collection Time: 07/03/22 10:05 PM   Specimen: BLOOD  Result Value Ref Range Status   Specimen Description BLOOD BLOOD LEFT ARM  Final   Special Requests   Final    BOTTLES DRAWN AEROBIC AND ANAEROBIC Blood Culture adequate volume   Culture   Final    NO GROWTH < 12 HOURS Performed  at [facility], [address] [place], [place] [address]    Report Status PENDING  Incomplete  Resp panel by RT-PCR (RSV, Flu A&B, Covid) Anterior Nasal Swab     Status: None   Collection Time: 07/03/22 10:08 PM   Specimen: Anterior Nasal Swab  Result Value Ref Range Status   SARS Coronavirus 2 by RT PCR NEGATIVE NEGATIVE Final    Comment: (NOTE) SARS-CoV-2 target nucleic acids are NOT DETECTED.  The SARS-CoV-2 RNA is generally detectable in upper respiratory specimens during the acute phase of infection. The lowest concentration of SARS-CoV-2 viral copies this assay can detect is 138 copies/mL. A negative result does not preclude SARS-Cov-2 infection and should not be used as the sole basis for treatment or other patient management decisions. A negative result may  occur with  improper specimen collection/handling, submission of specimen other than nasopharyngeal swab, presence of viral mutation(s) within the areas targeted by this assay, and inadequate number of viral copies(<138 copies/mL). A negative result must be combined with clinical observations, patient history, and epidemiological information. The expected result is Negative.  Fact Sheet for Patients:  BloggerCourse.com  Fact Sheet for Healthcare Providers:  SeriousBroker.it  This test is no t yet approved or cleared by the [place] FDA and  has been authorized for detection and/or diagnosis of SARS-CoV-2 by FDA under an Emergency Use Authorization (EUA). This EUA will remain  in effect (meaning this test can be used) for the duration of the COVID-19 declaration under Section 564(b)(1) of the Act, 21 U.S.C.section 360bbb-3(b)(1), unless the authorization is terminated  or revoked sooner.       Influenza A by PCR NEGATIVE NEGATIVE Final   Influenza B by PCR NEGATIVE NEGATIVE Final    Comment: (NOTE) The Xpert Xpress SARS-CoV-2/FLU/RSV plus assay is  intended as an aid in the diagnosis of influenza from Nasopharyngeal swab specimens and should not be used as a sole basis for treatment. Nasal washings and aspirates are unacceptable for Xpert Xpress SARS-CoV-2/FLU/RSV testing.  Fact Sheet for Patients: BloggerCourse.com  Fact Sheet for Healthcare Providers: SeriousBroker.it  This test is not yet approved or cleared by the [place] FDA and has been authorized for detection and/or diagnosis of SARS-CoV-2 by FDA under an Emergency Use Authorization (EUA). This EUA will remain in effect (meaning this test can be used) for the duration of the COVID-19 declaration under Section 564(b)(1) of the Act, 21 U.S.C. section 360bbb-3(b)(1), unless the authorization is terminated or revoked.     Resp Syncytial Virus by PCR NEGATIVE NEGATIVE Final    Comment: (NOTE) Fact Sheet for Patients: BloggerCourse.com  Fact Sheet for Healthcare Providers: SeriousBroker.it  This test is not yet approved or cleared by the [place] FDA and has been authorized for detection and/or diagnosis of SARS-CoV-2 by FDA under an Emergency Use Authorization (EUA). This EUA will remain in effect (meaning this test can be used) for the duration of the COVID-19 declaration under Section 564(b)(1) of the Act, 21 U.S.C. section 360bbb-3(b)(1), unless the authorization is terminated or revoked.  Performed at [facility], [address] [place], [place] [address]     Radiology Studies:  DG Abd Portable 1V  Result Date: 07/04/2022 CLINICAL DATA:  Abdominal pain EXAM: PORTABLE ABDOMEN - 1 VIEW COMPARISON:  None Available. FINDINGS: Nonobstructive bowel gas pattern. Visualized osseous structures are within normal limits. IMPRESSION: Negative. Electronically Signed   By: Charline Bills M.D.   On: 07/04/2022 00:49   DG Chest 2 View  Result Date:  07/03/2022 CLINICAL DATA:  Suspected sepsis.  Altered mental status. EXAM: CHEST - 2 VIEW COMPARISON:  Chest radiograph dated January 17, 220 FINDINGS: The heart size and mediastinal contours are within normal limits. Biapical pleural/parenchymal scarring. Chronic elevation of the right hemidiaphragm. Lungs are otherwise clear without evidence of acute pulmonary process. Bilateral glenohumeral and acromioclavicular osteoarthritis. Thoracic spondylosis. IMPRESSION: No active cardiopulmonary disease. Electronically Signed   By: Larose Hires D.O.   On: 07/03/2022 22:36    Scheduled Meds:    enoxaparin (LOVENOX) injection  40 mg Subcutaneous Daily   levothyroxine  75 mcg Oral QAC breakfast   umeclidinium-vilanterol  1 puff Inhalation Daily    Continuous Infusions:    cefTRIAXone (ROCEPHIN)  IV     lactated ringers 75 mL/hr at  07/04/22 0116     LOS: 1 day     Marcellus ScottAnand Barba Solt, MD,  FACP, [facility], [facility], [facility]MPC, [facility]CQM-PHYADV   [facility] [place]     To contact the attending provider between 7A-7P or the covering provider during after hours 7P-7A, please log into the web site [email or web address] and access using universal [place] password for that web site. If you do not have the password, please call the hospital operator.  07/04/2022, 7:26 AM

## 2022-07-04 NOTE — Evaluation (Signed)
Physical Therapy Evaluation Patient Details Name: Amanda Armstrong MRN: [id number] DOB: 19-Nov-1945 Today's Date: 07/04/2022  History of Present Illness  Pt is 76 year old presented to [facility] on  07/03/22 with AMS and worsening weakness and found to have UTI, dehydration and AKI. PMH - DM, copd, Knee DJD, HTN, rt foot drop, CVA frequent falls, TKR  Clinical Impression  Pt presents to PT with decr mobility due to weakness and poor balance. Per chart was independent with amb at home but today unable to sit unsupported. Currently recommending SNF but pt may improve quickly with improvement of infection and/or family may be able to provide significant help so will continue to updated recommendations as appropriate.        Recommendations for follow up therapy are one component of a multi-disciplinary discharge planning process, led by the attending physician.  Recommendations may be updated based on patient status, additional functional criteria and insurance authorization.  Follow Up Recommendations Skilled nursing-short term rehab (<3 hours/day) Can patient physically be transported by private vehicle: No    Assistance Recommended at Discharge Frequent or constant Supervision/Assistance  Patient can return home with the following  Two people to help with walking and/or transfers;Two people to help with bathing/dressing/bathroom;Assist for transportation;Assistance with cooking/housework    Equipment Recommendations None recommended by PT  Recommendations for Other Services       Functional Status Assessment Patient has had a recent decline in their functional status and demonstrates the ability to make significant improvements in function in a reasonable and predictable amount of time.     Precautions / Restrictions Precautions Precautions: Fall      Mobility  Bed Mobility Overal bed mobility: Needs Assistance Bed Mobility: Supine to Sit, Sit to Supine     Supine to sit: Max  assist, HOB elevated Sit to supine: Max assist, HOB elevated   General bed mobility comments: Assist to bring legs off of bed, elevate trunk into sitting, and bring hips to EOB. Assist to lower trunk and bring legs back up into the bed.    Transfers                   General transfer comment: Unable to safely attempt with 1 person and high stretcher    Ambulation/Gait                  Stairs            Wheelchair Mobility    Modified Rankin (Stroke Patients Only)       Balance Overall balance assessment: Needs assistance Sitting-balance support: Bilateral upper extremity supported, Feet unsupported Sitting balance-Leahy Scale: Poor Sitting balance - Comments: Initial min assist to correct posterior lean. Progressed to min guard Postural control: Posterior lean                                   Pertinent Vitals/Pain Pain Assessment Pain Assessment: No/denies pain    Home Living Family/patient expects to be discharged to:: Private residence Living Arrangements: Spouse/significant other Available Help at Discharge: Family;Available 24 hours/day Type of Home: House Home Access: Ramped entrance       Home Layout: One level Home Equipment: Agricultural consultant (2 wheels);Rollator (4 wheels);Grab bars - tub/shower;Wheelchair - manual;BSC/3in1      Prior Function Prior Level of Function : Independent/Modified Independent             Mobility Comments: Per  pt she was amb with rolling walker       Hand Dominance        Extremity/Trunk Assessment   Upper Extremity Assessment Upper Extremity Assessment: Defer to OT evaluation    Lower Extremity Assessment Lower Extremity Assessment: Generalized weakness       Communication      Cognition Arousal/Alertness: Awake/alert Behavior During Therapy: WFL for tasks assessed/performed Overall Cognitive Status: No family/caregiver present to determine baseline cognitive functioning                                  General Comments: Decr awareness. Followed 1 step commands.        General Comments General comments (skin integrity, edema, etc.): VSS    Exercises     Assessment/Plan    PT Assessment Patient needs continued PT services  PT Problem List Decreased strength;Decreased activity tolerance;Decreased balance;Decreased mobility;Decreased cognition       PT Treatment Interventions DME instruction;Gait training;Functional mobility training;Therapeutic activities;Therapeutic exercise;Balance training;Patient/family education    PT Goals (Current goals can be found in the Care Plan section)  Acute Rehab PT Goals Patient Stated Goal: not stated PT Goal Formulation: With patient Time For Goal Achievement: 07/18/22 Potential to Achieve Goals: Fair    Frequency Min 3X/week     Co-evaluation               AM-PAC PT "6 Clicks" Mobility  Outcome Measure Help needed turning from your back to your side while in a flat bed without using bedrails?: A Lot Help needed moving from lying on your back to sitting on the side of a flat bed without using bedrails?: A Lot Help needed moving to and from a bed to a chair (including a wheelchair)?: Total Help needed standing up from a chair using your arms (e.g., wheelchair or bedside chair)?: Total Help needed to walk in hospital room?: Total Help needed climbing 3-5 steps with a railing? : Total 6 Click Score: 8    End of Session   Activity Tolerance: Patient tolerated treatment well Patient left: in bed;with call bell/phone within reach   PT Visit Diagnosis: Other abnormalities of gait and mobility (R26.89);Muscle weakness (generalized) (M62.81)    Time: 1497-0263 PT Time Calculation (min) (ACUTE ONLY): 12 min   Charges:   PT Evaluation $PT Eval Moderate Complexity: 1 Mod          [facility] PT Acute Rehabilitation Services Office [phone]   Angelina Ok [facility] 07/04/2022, 7:07  PM

## 2022-07-04 NOTE — Progress Notes (Addendum)
Addendum  I had seen this patient earlier this morning.  RN messaged indicating that patient was having low-grade temperature and some tachypnea.  Proceeded to go and see patient in the ED.  Patient states that she feels "fine".  She denies worsening dyspnea.  She does indicate that she has chronic DOE at home.  Patient appears to be lying uncomfortably in the ED gurney flexed at her neck and waist.  She does not look any different or distressed compared to this morning. Temperature 100.2 F, respiratory rate in the low to mid 20s, not tachycardic or hypotensive.  Saturating in the high 90s to 100 on home oxygen 2 L/min Respiratory system: Clear to auscultation anteriorly.  Occasional basal crackles but otherwise clear to auscultation posteriorly.  No wheezing or rhonchi.  No increased work of breathing.  Assessment and plan: Do not see any worsening respiratory status.  In any event, discontinued IVF to avoid volume overload.  Recommend Tylenol dose now in case her fever is causing the tachypnea.  Getting a stat portable chest x-ray.  Will also resume her home dose of oral Lasix 20 mg daily now.  Patient is being moved from ED to [facility].  Advised RN at bedside to sign out to RN on floor to monitor closely and contact provider on-call for any concerns.  Marcellus Scott, MD,  FACP, FHM, [facility], [facility], [facility]   [facility] [place]     To contact the attending provider between 7A-7P or the covering provider during after hours 7P-7A, please log into the web site [email or web address] and access using universal [place] password for that web site. If you do not have the password, please call the hospital operator.

## 2022-07-04 NOTE — H&P (Addendum)
History and Physical    Patient: Amanda Armstrong JYN:[id number] DOB: [birth year] DOA: 07/03/2022 DOS: the patient was seen and examined on 07/04/2022 PCP: Redmond School, MD  Patient coming from: Home  Chief Complaint:  Chief Complaint  Patient presents with   Altered Mental Status   HPI: Amanda Armstrong is a 76 y.o. female with medical history significant of COPD with chronic hypoxemic respiratory failure on 3L,ongoing tobacco use, type 2 diabetes, hypothyroidism who presents with a week of increase weakness, lower abdominal pain.   Patient had had lower abdomina pain with weakness for the past week. Denies any dysuria. Cannot remember last bowel movement. Has felt abdominal distention. No nausea, vomiting or diarrhea. She is on 3L O2 at home with half a pack tobacco use. Has chronic cough but no worse than usual.   In the ED, she was febrile up to 102.27F on 2L. No leukocytosis. Has elevated creatinine of 1.62.   Negative flu/COVID/RSV PCR. CXR negative.   She was started on empirically IV vancomycin and cefepime prior to results of UA being positive.  Review of Systems: As mentioned in the history of present illness. All other systems reviewed and are negative. 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    pt stated that she has intermittent sx   Clostridium difficile infection    Daytime somnolence    Frequent falls    Headache(784.0)    SINUS HEADACHE OCCASSIONALLY   Hypertension    Hypothyroidism    Multifactorial gait disorder    Osteoporosis    Right foot drop    Snoring    Stroke ([place])    Tobacco use    Vertigo    Weakness    Past Surgical History:  Procedure Laterality Date   CATARACT EXTRACTION W/PHACO Right 06/28/2016   Procedure: CATARACT EXTRACTION PHACO AND INTRAOCULAR LENS PLACEMENT ([place]);  Surgeon: Rutherford Guys, MD;  Location:  [facility];  Service: Ophthalmology;  Laterality: Right;  CDE: 11.10   CATARACT EXTRACTION W/PHACO Left 07/26/2016   Procedure: CATARACT EXTRACTION PHACO AND INTRAOCULAR LENS PLACEMENT ([facility]);  Surgeon: Rutherford Guys, MD;  Location: [facility];  Service: Ophthalmology;  Laterality: Left;  CDE: 8.88   JOINT REPLACEMENT  11/08/3010   right total knee   TOTAL KNEE ARTHROPLASTY  11/07/2011   Procedure: TOTAL KNEE ARTHROPLASTY;  Surgeon: Lorn Junes, MD;  Location: [place];  Service: Orthopedics;  Laterality: Left;  DR Ephriam Knuckles 90 MINUTES FOR THIS CASE   TOTAL KNEE ARTHROPLASTY Right 2011   TUBAL LIGATION  1982   Social History:  reports that she has been smoking cigarettes. She has a 48.00 pack-year smoking history. She has never used smokeless tobacco. She reports that she does not drink alcohol and does not use drugs.  Allergies  Allergen Reactions   Iron Other (See Comments)    "upset stomach"   Oxycodone Other (See Comments)    "CX HER TO FELL LIKE SHE IS OUT OF HER BODY"   Sudafed [Pseudoephedrine Hcl] Other (See Comments)    "funny feeling"    Family History  Problem Relation Age of Onset   Heart attack Mother    COPD Father    Arthritis Sister    Heart disease Brother     Prior to Admission medications   Medication Sig Start Date End  Date Taking? Authorizing Provider  ACCU-CHEK GUIDE test strip USE TEST STRIP TO CHECK GLUCOSE TWICE DAILY BEFORE BREAKFAST AND  BEFORE BEDTIME 02/15/22   Brita Romp, NP  Accu-Chek Softclix Lancets lancets USE 1 LANCET TO CHECK GLUCOSE TWICE DAILY 05/16/22   Brita Romp, NP  alendronate (FOSAMAX) 70 MG tablet Take 70 mg by mouth once a week. 03/18/18   [provider]  ALPRAZolam Duanne Moron) 1 MG tablet Take 1 mg by mouth at bedtime. 04/22/18   [provider]  Celedonio Miyamoto 62.5-25 MCG/ACT AEPB Inhale 1 puff into the lungs daily. 06/24/21   [provider]  aspirin 81 MG EC tablet Take 81 mg by mouth in the morning. 03/01/18    [provider]  atorvastatin (LIPITOR) 20 MG tablet Take 20 mg by mouth daily at 12 noon. 02/11/18   [provider]  blood glucose meter kit and supplies Dispense based on patient and insurance preference. Use up to four times daily as directed. (FOR ICD-10 E10.9, E11.9). 03/24/21   Amin, Jeanella Flattery, MD  cephALEXin (KEFLEX) 500 MG capsule Take 500 mg by mouth every 12 (twelve) hours. 11/11/21   [provider]  Cholecalciferol (VITAMIN D3) 50 MCG (2000 UT) capsule Take 2,000 Units by mouth daily.    [provider]  furosemide (LASIX) 20 MG tablet Take 20 mg by mouth daily.    [provider]  HYDROcodone-acetaminophen (NORCO) 10-325 MG tablet Take 1 tablet by mouth every 4 (four) hours as needed. 12/02/20   [provider]  Insulin Pen Needle (PEN NEEDLES 3/16") 31G X 5 MM MISC 100 each by Does not apply route 2 (two) times daily. 03/24/21   Amin, Jeanella Flattery, MD  levothyroxine (SYNTHROID) 75 MCG tablet Take 75 mcg by mouth daily before breakfast.    [provider]  OXYGEN Inhale 1 L/min into the lungs daily.    [provider]  RELION PEN NEEDLES 31G X 6 MM MISC Use to inject insulin once daily. 07/27/21   Brita Romp, NP  sulfamethoxazole-trimethoprim (BACTRIM) 400-80 MG tablet Take 1 tablet by mouth at bedtime. 06/28/21   [provider]    Physical Exam: Vitals:   07/03/22 2154 07/03/22 2200 07/03/22 2330 07/03/22 2346  BP: (!) 107/52  102/69 102/69  Pulse: 90 87  91  Resp: 20 (!) 35  (!) 22  Temp: 99 F (37.2 C)   (!) 102.5 F (39.2 C)  TempSrc: Oral   Oral  SpO2: 96% 95%  100%   Constitutional: NAD, calm, comfortable, nontoxic appearing obese female laying upright in bed Eyes:  lids and conjunctivae normal ENMT: Mucous membranes are moist. Neck: normal, supple,  Respiratory: clear to auscultation bilaterally, no wheezing, no crackles.  Moderately labored respiration of breath but no accessory  muscle use.  Cardiovascular: Regular rate and rhythm, no murmurs / rubs / gallops. No extremity edema.  Abdomen: Soft, moderately distended, no tenderness, Bowel sounds positive.  Musculoskeletal: no clubbing / cyanosis. No joint deformity upper and lower extremities. Good ROM, no contractures. Normal muscle tone.  Skin: no rashes, lesions, ulcers.  Neurologic: CN 2-12 grossly intact. Strength 5/5 in all 4.  Psychiatric: Normal judgment and insight. Alert and oriented x 3. Normal mood. Data Reviewed:  See HPI  Assessment and Plan: * UTI (urinary tract infection) -Treated empirically in the ED with IV vancomycin and IV cefepime.  UA done and returned grossly positive with positive nitrite, many bacteria and greater than 50  WBC. -Continue IV Rocephin in the morning -She also notes abdominal distention and unclear last BM. Check Abd X-ray.  Tobacco use -continues to smoke half a pack daily. No intentions of quitting. Declines nicotine patch.  COPD (chronic obstructive pulmonary disease) ([place]) -With chronic hypoxemic respiratory failure. She reports being on 3 L at baseline but on recent pulmonology note it was documented to be 1.5L. Will continue to monitor with continuous pulse ox with goal 88-92% -Has some increased labored respiration but does not appear in acute exacerbation. Monitor on continuous pulse oximetry  Type 2 diabetes mellitus ([place]) -controlled. last A1C of 5.9  AKI (acute kidney injury) ([place]) -Has elevated creatinine of 1.62 from prior baseline of 1.21 -Given 1L bolus in ED. Keep on continuous IV fluid overnight  Hypothyroidism - Continue levothyroxine      Advance Care Planning:   Code Status: Full Code   Consults: none  Family Communication: none at bedside  Severity of Illness: The appropriate patient status for this patient is INPATIENT. Inpatient status is judged to be reasonable and necessary in order to provide the required intensity of service to  ensure the patient's safety. The patient's presenting symptoms, physical exam findings, and initial radiographic and laboratory data in the context of their chronic comorbidities is felt to place them at high risk for further clinical deterioration. Furthermore, it is not anticipated that the patient will be medically stable for discharge from the hospital within 2 midnights of admission.   * I certify that at the point of admission it is my clinical judgment that the patient will require inpatient hospital care spanning beyond 2 midnights from the point of admission due to high intensity of service, high risk for further deterioration and high frequency of surveillance required.*  Author: Orene Desanctis, DO 07/04/2022 12:46 AM  For on call review [email or web address].

## 2022-07-04 NOTE — Assessment & Plan Note (Signed)
-  controlled. last A1C of 5.9

## 2022-07-04 NOTE — Assessment & Plan Note (Signed)
-  continues to smoke half a pack daily. No intentions of quitting. Declines nicotine patch.

## 2022-07-04 NOTE — Assessment & Plan Note (Signed)
Continue levothyroxine 

## 2022-07-04 NOTE — ED Notes (Signed)
Notified MD of increased respiratory rate and increased Temp.

## 2022-07-04 NOTE — Assessment & Plan Note (Addendum)
-  Treated empirically in the ED with IV vancomycin and IV cefepime.  UA done and returned grossly positive with positive nitrite, many bacteria and greater than 50 WBC. -Continue IV Rocephin in the morning -She also notes abdominal distention and unclear last BM. Check Abd X-ray.

## 2022-07-04 NOTE — Assessment & Plan Note (Signed)
-  Has elevated creatinine of 1.62 from prior baseline of 1.21 -Given 1L bolus in ED. Keep on continuous IV fluid overnight

## 2022-07-05 DIAGNOSIS — N189 Chronic kidney disease, unspecified: Secondary | ICD-10-CM | POA: Diagnosis not present

## 2022-07-05 DIAGNOSIS — N179 Acute kidney failure, unspecified: Secondary | ICD-10-CM | POA: Diagnosis not present

## 2022-07-05 DIAGNOSIS — N3000 Acute cystitis without hematuria: Secondary | ICD-10-CM | POA: Diagnosis not present

## 2022-07-05 DIAGNOSIS — E860 Dehydration: Secondary | ICD-10-CM | POA: Diagnosis not present

## 2022-07-05 LAB — CBC
HCT: 35.9 % — ABNORMAL LOW (ref 36.0–46.0)
Hemoglobin: 11.5 g/dL — ABNORMAL LOW (ref 12.0–15.0)
MCH: 29.4 pg (ref 26.0–34.0)
MCHC: 32 g/dL (ref 30.0–36.0)
MCV: 91.8 fL (ref 80.0–100.0)
Platelets: 132 10*3/uL — ABNORMAL LOW (ref 150–400)
RBC: 3.91 MIL/uL (ref 3.87–5.11)
RDW: 15 % (ref 11.5–15.5)
WBC: 6.3 10*3/uL (ref 4.0–10.5)
nRBC: 0 % (ref 0.0–0.2)

## 2022-07-05 LAB — BASIC METABOLIC PANEL
Anion gap: 10 (ref 5–15)
BUN: 14 mg/dL (ref 8–23)
CO2: 23 mmol/L (ref 22–32)
Calcium: 8.8 mg/dL — ABNORMAL LOW (ref 8.9–10.3)
Chloride: 105 mmol/L (ref 98–111)
Creatinine, Ser: 1.4 mg/dL — ABNORMAL HIGH (ref 0.44–1.00)
GFR, Estimated: 39 mL/min — ABNORMAL LOW (ref >60)
Glucose, Bld: 87 mg/dL (ref 70–99)
Potassium: 4 mmol/L (ref 3.5–5.1)
Sodium: 138 mmol/L (ref 135–145)

## 2022-07-05 NOTE — Progress Notes (Signed)
Physical Therapy Treatment Patient Details Name: Amanda Armstrong MRN: [id number] DOB: 03-26-1946 Today's Date: 07/05/2022   History of Present Illness Pt is 76 year old presented to [facility] on  07/03/22 with AMS and worsening weakness and found to have UTI, dehydration and AKI. PMH - DM, copd, Knee DJD, HTN, rt foot drop, CVA frequent falls, TKR    PT Comments    Continuing work on functional mobility and activity tolerance;  Showing very nice progress today compared with PT eval, and continue to anticipate good progress as infection clears; Mod assist to get up, stand, and take small steps bed to recliner; Discussed case with OT, and pt has adequate assist at home (and tends to perform mobility and ADL tasks much better with her familiar family and caregivers); will update recs to getting home with HHPT and HHOT follow up   Recommendations for follow up therapy are one component of a multi-disciplinary discharge planning process, led by the attending physician.  Recommendations may be updated based on patient status, additional functional criteria and insurance authorization.  Follow Up Recommendations  Home health PT Can patient physically be transported by private vehicle: No   Assistance Recommended at Discharge Frequent or constant Supervision/Assistance  Patient can return home with the following Two people to help with walking and/or transfers;Two people to help with bathing/dressing/bathroom;Assist for transportation;Assistance with cooking/housework   Equipment Recommendations  None recommended by PT    Recommendations for Other Services       Precautions / Restrictions Precautions Precautions: Fall Restrictions Weight Bearing Restrictions: No     Mobility  Bed Mobility Overal bed mobility: Needs Assistance Bed Mobility: Supine to Sit     Supine to sit: Mod assist     General bed mobility comments: Light mod handheld assist to pull to sit    Transfers Overall  transfer level: Needs assistance Equipment used: Rolling walker (2 wheels) Transfers: Sit to/from Stand, Bed to chair/wheelchair/BSC Sit to Stand: Mod assist   Step pivot transfers: Mod assist, +2 safety/equipment       General transfer comment: Stood from bed with 2 person assist to RW; Pivot steps to chair, needing heavy mod assist due to persistent posterior lean    Ambulation/Gait                   Stairs             Wheelchair Mobility    Modified Rankin (Stroke Patients Only)       Balance     Sitting balance-Leahy Scale: Poor Sitting balance - Comments: Initial min assist to correct posterior lean. Progressed to min guard Postural control: Posterior lean   Standing balance-Leahy Scale: Poor                              Cognition Arousal/Alertness: Awake/alert Behavior During Therapy: WFL for tasks assessed/performed Overall Cognitive Status: No family/caregiver present to determine baseline cognitive functioning                                 General Comments: Decr awareness. Followed 1 step commands.        Exercises      General Comments General comments (skin integrity, edema, etc.): Session conducted on 2 L supplemental O2      Pertinent Vitals/Pain Pain Assessment Pain Assessment: No/denies pain    Home Living  Family/patient expects to be discharged to:: Private residence Living Arrangements: Spouse/significant other Available Help at Discharge: Family;Available 24 hours/day;Personal care attendant (has aide 5 days/wk 8 hrs/day) Type of Home: House Home Access: Ramped entrance       Home Layout: One level Home Equipment: Agricultural consultant (2 wheels);Rollator (4 wheels);Grab bars - tub/shower;Wheelchair - manual;BSC/3in1;Shower seat;Grab bars - toilet (lift chair)      Prior Function            PT Goals (current goals can now be found in the care plan section) Acute Rehab PT Goals Patient Stated  Goal: Agrees to getting OOB PT Goal Formulation: With patient Time For Goal Achievement: 07/18/22 Potential to Achieve Goals: Fair Progress towards PT goals: Progressing toward goals    Frequency    Min 3X/week      PT Plan Discharge plan needs to be updated    Co-evaluation              AM-PAC PT "6 Clicks" Mobility   Outcome Measure  Help needed turning from your back to your side while in a flat bed without using bedrails?: A Little Help needed moving from lying on your back to sitting on the side of a flat bed without using bedrails?: A Little Help needed moving to and from a bed to a chair (including a wheelchair)?: A Lot Help needed standing up from a chair using your arms (e.g., wheelchair or bedside chair)?: A Lot Help needed to walk in hospital room?: Total Help needed climbing 3-5 steps with a railing? : Total 6 Click Score: 12    End of Session Equipment Utilized During Treatment: Gait belt;Oxygen Activity Tolerance: Patient tolerated treatment well Patient left: in chair;with call bell/phone within reach;with chair alarm set;with nursing/sitter in room Nurse Communication: Mobility status PT Visit Diagnosis: Other abnormalities of gait and mobility (R26.89);Muscle weakness (generalized) (M62.81)     Time: 3403-5248 PT Time Calculation (min) (ACUTE ONLY): 23 min  Charges:  $Therapeutic Activity: 23-37 mins                     Van Clines, PT  Acute Rehabilitation Services Office [phone]    Amanda Armstrong 07/05/2022, 5:04 PM

## 2022-07-05 NOTE — Progress Notes (Signed)
PROGRESS NOTE   Amanda Armstrong  WUJ:[id number]    DOB: 12-04-1945    DOA: 07/03/2022  PCP: [name], MD   I have briefly reviewed patients previous medical records in [facility].  Chief Complaint  Patient presents with   Altered Mental Status    Brief Narrative:  76 year old married female, ambulates with the help of a cane, PMH of COPD, chronic respiratory failure with hypoxia on 2 L/min home oxygen, ongoing tobacco use, type II DM, hypothyroidism, presented to the ED on 07/03/2022 with complaints of worsening weakness and lower abdominal pain for a week.  Per family's report, had diarrhea for 2 days PTA but has not had a BM since ED.  In the ED, fever of 102.5 F.  Admitted for acute cystitis, dehydration and acute kidney injury.  Improving.  Hopeful DC home 12/20.   Assessment & Plan:  Principal Problem:   UTI (urinary tract infection) Active Problems:   Hypothyroidism   Cigarette smoker   AKI (acute kidney injury) (HCC)   Type 2 diabetes mellitus (HCC)   COPD (chronic obstructive pulmonary disease) (HCC)   Chronic hypoxic respiratory failure, on home oxygen therapy (HCC)   Tobacco use   Acute cystitis  UTI (urinary tract infection)/acute cystitis Presented with fever of 102.5 F.  Per ED RN, in and out catheterization showed thick urine.  Urine microscopy grossly positive for UTI.  Received IV vancomycin and cefepime in ED.  Now on IV ceftriaxone pending urine culture results, continue.  Etiology of abdominal pain unclear, may be related to cystitis versus other etiologies.  No urinary retention.  KUB negative.  Abdominal pain has resolved.  Blood cultures x 2: Negative to date.  Despite ordering urine culture, this was not done.  Urine culture from 03/22/2021 had shown Enterococcus faecalis/VRE.  However since patient has responded to IV ceftriaxone, complete 3-day course tomorrow prior to discharge.  Would like to monitor overnight due to less than 48 hours since  high fever.  Abdominal pain has resolved.  Dehydration: Secondary to UTI and fever.  Was on gentle IV fluids for a few hours.  IV fluids discontinued last evening due to some concern for dyspnea.  Oral fluids as tolerated.  Dehydration resolved.  Acute kidney injury complicating stage IIIa CKD: Creatinine 1.21 in August 23 with a GFR of 46.  Presented with creatinine of 1.62.  Secondary to dehydration.  Briefly on IV fluids, now discontinued.  Creatinine has plateaued in the 1.4 range over the last 2 days.  Follow BMP in AM.  COPD, chronic respiratory failure with hypoxia on 2 L/min home oxygen: Currently without clinical bronchospasm.  Titrate oxygen to maintain saturations between 88-92%.  Incentive spirometry and flutter valve.  No tachypnea.  Chest x-ray repeated x 2 since admission without acute findings.  Tobacco abuse: Smokes half pack per day.  Initially declined nicotine patch but now wants it.  Continue nicotine patch.  Type 2 diabetes mellitus (HCC) -controlled. last A1C of 5.9   Hypothyroidism Continue levothyroxine  Anemia: Unclear etiology.  Baseline hemoglobin probably in the 13-14 range.?  Hemodilution.  No active bleeding reported.  Hemoglobin stable in the 11 g range.  Mild thrombocytopenia, likely related to infectious etiology.  Stable.  Follow CBC in AM.   Body mass index is 34.05 kg/m.   DVT prophylaxis: enoxaparin (LOVENOX) injection 40 mg Start: 07/04/22 1000     Code Status: Full Code:  Family Communication: Daughter via phone 12/20. Disposition:  Monitor overnight  for recurrence of fever and if none, possible 12/20.  PT recommending SNF, TOC consulted.     Consultants:     Procedures:     Antimicrobials:   As above   Subjective:  Denies abdominal pain or dyspnea.  No other complaints reported.  Objective:   Vitals:   07/04/22 2030 07/05/22 0544 07/05/22 0823 07/05/22 0922  BP: (!) 102/44 (!) 119/107  (!) 98/55  Pulse: 77 80  75  Resp: 20  18  18   Temp: 99.1 F (37.3 C) 98.3 F (36.8 C)  98.7 F (37.1 C)  TempSrc: Oral Oral  Oral  SpO2: 96% 99% 98%   Weight: 73.9 kg     Height: 4\' 10"  (1.473 m)       General exam: Elderly female, moderately built and overweight, lying comfortably propped up in bed without distress.  Looks improved compared to yesterday. Respiratory system: Clear to auscultation.  No increased work of breathing. Cardiovascular system: S1 & S2 heard, RRR. No JVD, murmurs, rubs, gallops or clicks.  No ankle edema.  Off of telemetry. Gastrointestinal system: Abdomen is nondistended, soft and nontender today. No organomegaly or masses appreciated. Normal bowel sounds heard. Central nervous system: Alert and oriented x 2. No focal neurological deficits. Extremities: Symmetric 5 x 5 power. Skin: No rashes, lesions or ulcers Psychiatry: Judgement and insight appear somewhat impaired. Mood & affect appropriate.     Data Reviewed:   I have personally reviewed following labs and imaging studies   CBC: Recent Labs  Lab 07/03/22 2206 07/04/22 0500 07/05/22 0402  WBC 7.4 5.8 6.3  NEUTROABS 5.9  --   --   HGB 11.4* 11.4* 11.5*  HCT 34.5* 35.4* 35.9*  MCV 91.0 93.7 91.8  PLT 178 131* 132*    Basic Metabolic Panel: Recent Labs  Lab 07/03/22 2206 07/04/22 0500 07/05/22 0402  NA 138 137 138  K 3.6 3.9 4.0  CL 107 106 105  CO2 23 22 23   GLUCOSE 109* 89 87  BUN 18 14 14   CREATININE 1.62* 1.44* 1.40*  CALCIUM 8.7* 8.4* 8.8*    Liver Function Tests: Recent Labs  Lab 07/03/22 2206  AST 26  ALT 21  ALKPHOS 53  BILITOT 0.6  PROT 6.1*  ALBUMIN 3.4*    CBG: No results for input(s): "GLUCAP" in the last 168 hours.  Microbiology Studies:   Recent Results (from the past 240 hour(s))  Culture, blood (Routine x 2)     Status: None (Preliminary result)   Collection Time: 07/03/22 10:00 PM   Specimen: BLOOD  Result Value Ref Range Status   Specimen Description BLOOD LEFT ANTECUBITAL  Final    Special Requests   Final    BOTTLES DRAWN AEROBIC AND ANAEROBIC Blood Culture adequate volume   Culture   Final    NO GROWTH 2 DAYS Performed at [facility], [address] [place], [place] [address]    Report Status PENDING  Incomplete  Culture, blood (Routine x 2)     Status: None (Preliminary result)   Collection Time: 07/03/22 10:05 PM   Specimen: BLOOD  Result Value Ref Range Status   Specimen Description BLOOD BLOOD LEFT ARM  Final   Special Requests   Final    BOTTLES DRAWN AEROBIC AND ANAEROBIC Blood Culture adequate volume   Culture   Final    NO GROWTH 2 DAYS Performed at [facility], [address] [place], [place] [address]    Report Status  PENDING  Incomplete  Resp panel by RT-PCR (RSV, Flu A&B, Covid) Anterior Nasal Swab     Status: None   Collection Time: 07/03/22 10:08 PM   Specimen: Anterior Nasal Swab  Result Value Ref Range Status   SARS Coronavirus 2 by RT PCR NEGATIVE NEGATIVE Final    Comment: (NOTE) SARS-CoV-2 target nucleic acids are NOT DETECTED.  The SARS-CoV-2 RNA is generally detectable in upper respiratory specimens during the acute phase of infection. The lowest concentration of SARS-CoV-2 viral copies this assay can detect is 138 copies/mL. A negative result does not preclude SARS-Cov-2 infection and should not be used as the sole basis for treatment or other patient management decisions. A negative result may occur with  improper specimen collection/handling, submission of specimen other than nasopharyngeal swab, presence of viral mutation(s) within the areas targeted by this assay, and inadequate number of viral copies(<138 copies/mL). A negative result must be combined with clinical observations, patient history, and epidemiological information. The expected result is Negative.  Fact Sheet for Patients:  BloggerCourse.com  Fact Sheet for Healthcare Providers:   SeriousBroker.it  This test is no t yet approved or cleared by the [place] FDA and  has been authorized for detection and/or diagnosis of SARS-CoV-2 by FDA under an Emergency Use Authorization (EUA). This EUA will remain  in effect (meaning this test can be used) for the duration of the COVID-19 declaration under Section 564(b)(1) of the Act, 21 U.S.C.section 360bbb-3(b)(1), unless the authorization is terminated  or revoked sooner.       Influenza A by PCR NEGATIVE NEGATIVE Final   Influenza B by PCR NEGATIVE NEGATIVE Final    Comment: (NOTE) The Xpert Xpress SARS-CoV-2/FLU/RSV plus assay is intended as an aid in the diagnosis of influenza from Nasopharyngeal swab specimens and should not be used as a sole basis for treatment. Nasal washings and aspirates are unacceptable for Xpert Xpress SARS-CoV-2/FLU/RSV testing.  Fact Sheet for Patients: BloggerCourse.com  Fact Sheet for Healthcare Providers: SeriousBroker.it  This test is not yet approved or cleared by the [place] FDA and has been authorized for detection and/or diagnosis of SARS-CoV-2 by FDA under an Emergency Use Authorization (EUA). This EUA will remain in effect (meaning this test can be used) for the duration of the COVID-19 declaration under Section 564(b)(1) of the Act, 21 U.S.C. section 360bbb-3(b)(1), unless the authorization is terminated or revoked.     Resp Syncytial Virus by PCR NEGATIVE NEGATIVE Final    Comment: (NOTE) Fact Sheet for Patients: BloggerCourse.com  Fact Sheet for Healthcare Providers: SeriousBroker.it  This test is not yet approved or cleared by the [place] FDA and has been authorized for detection and/or diagnosis of SARS-CoV-2 by FDA under an Emergency Use Authorization (EUA). This EUA will remain in effect (meaning this test can be used) for  the duration of the COVID-19 declaration under Section 564(b)(1) of the Act, 21 U.S.C. section 360bbb-3(b)(1), unless the authorization is terminated or revoked.  Performed at [facility], [address], [place], [place] [address]     Radiology Studies:  DG CHEST PORT 1 VIEW  Result Date: 07/04/2022 CLINICAL DATA:  Shortness of breath. EXAM: PORTABLE CHEST 1 VIEW COMPARISON:  07/03/2022. FINDINGS: The heart size and mediastinal contours are within normal limits. There is atherosclerotic calcification of the aorta. Interstitial prominence is noted bilaterally and unchanged from prior exams and likely chronic. No consolidation, effusion, or pneumothorax. No acute osseous abnormality. IMPRESSION: No active disease. Electronically Signed  By: Thornell Sartorius M.D.   On: 07/04/2022 20:19   DG Abd Portable 1V  Result Date: 07/04/2022 CLINICAL DATA:  Abdominal pain EXAM: PORTABLE ABDOMEN - 1 VIEW COMPARISON:  None Available. FINDINGS: Nonobstructive bowel gas pattern. Visualized osseous structures are within normal limits. IMPRESSION: Negative. Electronically Signed   By: Charline Bills M.D.   On: 07/04/2022 00:49   DG Chest 2 View  Result Date: 07/03/2022 CLINICAL DATA:  Suspected sepsis.  Altered mental status. EXAM: CHEST - 2 VIEW COMPARISON:  Chest radiograph dated January 17, 220 FINDINGS: The heart size and mediastinal contours are within normal limits. Biapical pleural/parenchymal scarring. Chronic elevation of the right hemidiaphragm. Lungs are otherwise clear without evidence of acute pulmonary process. Bilateral glenohumeral and acromioclavicular osteoarthritis. Thoracic spondylosis. IMPRESSION: No active cardiopulmonary disease. Electronically Signed   By: Larose Hires D.O.   On: 07/03/2022 22:36    Scheduled Meds:    ALPRAZolam  1 mg Oral QHS   aspirin EC  81 mg Oral q AM   atorvastatin  20 mg Oral Q1200   cholecalciferol  2,000 Units Oral Daily   enoxaparin (LOVENOX)  injection  40 mg Subcutaneous Daily   furosemide  20 mg Oral Daily   levothyroxine  75 mcg Oral QAC breakfast   nicotine  14 mg Transdermal Daily   umeclidinium-vilanterol  1 puff Inhalation Daily    Continuous Infusions:    cefTRIAXone (ROCEPHIN)  IV 2 g (07/05/22 1012)     LOS: 2 days     Marcellus Scott, MD,  FACP, FHM, SFHM, [facility], [facility]   [facility] [place]     To contact the attending provider between 7A-7P or the covering provider during after hours 7P-7A, please log into the web site [email or web address] and access using universal [place] password for that web site. If you do not have the password, please call the hospital operator.  07/05/2022, 12:49 PM

## 2022-07-05 NOTE — Evaluation (Signed)
Occupational Therapy Evaluation Patient Details Name: Amanda Armstrong MRN: [id number] DOB: [birth year] Today's Date: 07/05/2022   History of Present Illness Pt is 76 year old presented to [facility] on  07/03/22 with AMS and worsening weakness and found to have UTI, dehydration and AKI. PMH - DM, copd, Knee DJD, HTN, rt foot drop, CVA frequent falls, TKR   Clinical Impression   Pt seen with daughter in room. At baseline, pt has a PCA 8 hrs/day, 5 days/ wk who assists with mobility and ADL tasks @ RW or wc level.  Pt anxious about mobilizing with unfamiliar people, however only required min A to stand and take steps with daughter. Daughter states they are set up at home to care for her mother and wishes to resume [facility] services, not go to SNF for rehab. Recommend adding HHOT to services if able. Acute OT to follow to facilitate safe DC home.      Recommendations for follow up therapy are one component of a multi-disciplinary discharge planning process, led by the attending physician.  Recommendations may be updated based on patient status, additional functional criteria and insurance authorization.   Follow Up Recommendations  Home health OT     Assistance Recommended at Discharge Frequent or constant Supervision/Assistance  Patient can return home with the following A lot of help with walking and/or transfers;A lot of help with bathing/dressing/bathroom;Direct supervision/assist for financial management;Direct supervision/assist for medications management;Assistance with cooking/housework;Help with stairs or ramp for entrance;Assist for transportation    Functional Status Assessment  Patient has had a recent decline in their functional status and demonstrates the ability to make significant improvements in function in a reasonable and predictable amount of time.  Equipment Recommendations  None recommended by OT    Recommendations for Other Services       Precautions / Restrictions  Precautions Precautions: Fall Restrictions Weight Bearing Restrictions: No      Mobility Bed Mobility               General bed mobility comments: OOB in chair    Transfers Overall transfer level: Needs assistance Equipment used: Rolling walker (2 wheels) Transfers: Sit to/from Stand Sit to Stand: Min assist, +2 safety/equipment  Able to take several steps; daughter comfortable with this level of mobility; recommended using wc initially is gati was more unsteady; daughter verbalized understanding               Balance Overall balance assessment: Needs assistance Sitting-balance support: Bilateral upper extremity supported, Feet unsupported Sitting balance-Leahy Scale: Poor Sitting balance - Comments: Initial min assist to correct posterior lean. Progressed to min guard Postural control: Posterior lean                                 ADL either performed or assessed with clinical judgement   ADL Overall ADL's : Needs assistance/impaired Eating/Feeding: Set up   Grooming: Set up   Upper Body Bathing: Minimal assistance   Lower Body Bathing: Moderate assistance;Sit to/from stand   Upper Body Dressing : Minimal assistance   Lower Body Dressing: Maximal assistance   Toilet Transfer: Moderate assistance;Stand-pivot   Toileting- Clothing Manipulation and Hygiene: Total assistance Toileting - Clothing Manipulation Details (indicate cue type and reason): incontinenet     Functional mobility during ADLs: Moderate assistance;Rolling walker (2 wheels);Cueing for sequencing;Cueing for safety General ADL Comments: anxious about mobility; does much better with family     Vision  Additional Comments: low vision at baseline     Perception     Praxis      Pertinent Vitals/Pain Pain Assessment Pain Assessment: No/denies pain     Hand Dominance Right   Extremity/Trunk Assessment Upper Extremity Assessment Upper Extremity Assessment:  Generalized weakness (R sdie weaker from previous CVA)   Lower Extremity Assessment Lower Extremity Assessment: Defer to PT evaluation   Cervical / Trunk Assessment Cervical / Trunk Assessment: Other exceptions (posterior bias)   Communication Communication Communication: No difficulties   Cognition Arousal/Alertness: Awake/alert Behavior During Therapy: WFL for tasks assessed/performed Overall Cognitive Status: impaired at baseline; daughter states this is her baseline Slow processing                                 General Comments:    General Comments  VSS on 1L    Exercises     Shoulder Instructions      Home Living Family/patient expects to be discharged to:: Private residence Living Arrangements: Spouse/significant other Available Help at Discharge: Family;Available 24 hours/day;Personal care attendant (has aide 5 days/wk 8 hrs/day) Type of Home: House Home Access: Ramped entrance     Home Layout: One level     Bathroom Shower/Tub: Tub/shower unit;Curtain   Bathroom Toilet: Handicapped height Bathroom Accessibility: Yes How Accessible: Accessible via walker Home Equipment: Rolling Walker (2 wheels);Rollator (4 wheels);Grab bars - tub/shower;Wheelchair - manual;BSC/3in1;Shower seat;Grab bars - toilet (lift chair)          Prior Functioning/Environment Prior Level of Function : Needs assist       Physical Assist : Mobility (physical);ADLs (physical) Mobility (physical): Transfers;Gait;Bed mobility   Mobility Comments: Per pt she was amb with rolling walker ADLs Comments: Aide helps with bathing and dressing; assist wtih showers and walking to the bathroom to reduce risk of falling; uses her wc in the am with someone pushing her, however the remainder of the day she uses her RW; normally able to walk only 20 ft before requiring to sit; family uses a wc in teh community        OT Problem List: Decreased strength;Decreased activity  tolerance;Impaired balance (sitting and/or standing);Decreased safety awareness;Obesity      OT Treatment/Interventions: Self-care/ADL training;Therapeutic exercise;DME and/or AE instruction;Therapeutic activities;Patient/family education;Balance training    OT Goals(Current goals can be found in the care plan section) Acute Rehab OT Goals Patient Stated Goal: to go home OT Goal Formulation: With patient/family Time For Goal Achievement: 07/19/22 Potential to Achieve Goals: Good  OT Frequency: Min 2X/week    Co-evaluation              AM-PAC OT "6 Clicks" Daily Activity     Outcome Measure Help from another person eating meals?: None Help from another person taking care of personal grooming?: A Little Help from another person toileting, which includes using toliet, bedpan, or urinal?: A Lot Help from another person bathing (including washing, rinsing, drying)?: A Lot Help from another person to put on and taking off regular upper body clothing?: A Little Help from another person to put on and taking off regular lower body clothing?: A Lot 6 Click Score: 16   End of Session Equipment Utilized During Treatment: Gait belt;Rolling walker (2 wheels) Nurse Communication: Mobility status  Activity Tolerance: Patient tolerated treatment well Patient left: in chair;with call bell/phone within reach;with chair alarm set  OT Visit Diagnosis: Unsteadiness on feet (R26.81);Other abnormalities of gait and  mobility (R26.89);Muscle weakness (generalized) (M62.81)                Time: 7096-2836 OT Time Calculation (min): 24 min Charges:  OT General Charges $OT Visit: 1 Visit OT Evaluation $OT Eval Moderate Complexity: 1 Mod OT Treatments $Self Care/Home Management : 8-22 mins  Luisa Dago, OT/L   Acute OT Clinical Specialist Acute Rehabilitation Services Pager [phone] Office [phone]   [facility] 07/05/2022, 3:06 PM

## 2022-07-05 NOTE — Progress Notes (Signed)
For MD,patient daughter(Barbara) [phone], wants a phone call before discharge to discuss plan of care.

## 2022-07-06 DIAGNOSIS — E039 Hypothyroidism, unspecified: Secondary | ICD-10-CM | POA: Diagnosis not present

## 2022-07-06 DIAGNOSIS — N179 Acute kidney failure, unspecified: Secondary | ICD-10-CM | POA: Diagnosis not present

## 2022-07-06 DIAGNOSIS — N3000 Acute cystitis without hematuria: Secondary | ICD-10-CM | POA: Diagnosis not present

## 2022-07-06 LAB — CBC
HCT: 33.1 % — ABNORMAL LOW (ref 36.0–46.0)
Hemoglobin: 11.1 g/dL — ABNORMAL LOW (ref 12.0–15.0)
MCH: 30.2 pg (ref 26.0–34.0)
MCHC: 33.5 g/dL (ref 30.0–36.0)
MCV: 89.9 fL (ref 80.0–100.0)
Platelets: 146 10*3/uL — ABNORMAL LOW (ref 150–400)
RBC: 3.68 MIL/uL — ABNORMAL LOW (ref 3.87–5.11)
RDW: 14.7 % (ref 11.5–15.5)
WBC: 3.9 10*3/uL — ABNORMAL LOW (ref 4.0–10.5)
nRBC: 0 % (ref 0.0–0.2)

## 2022-07-06 LAB — BASIC METABOLIC PANEL
Anion gap: 9 (ref 5–15)
BUN: 15 mg/dL (ref 8–23)
CO2: 23 mmol/L (ref 22–32)
Calcium: 8.6 mg/dL — ABNORMAL LOW (ref 8.9–10.3)
Chloride: 107 mmol/L (ref 98–111)
Creatinine, Ser: 1.12 mg/dL — ABNORMAL HIGH (ref 0.44–1.00)
GFR, Estimated: 50 mL/min — ABNORMAL LOW (ref >60)
Glucose, Bld: 89 mg/dL (ref 70–99)
Potassium: 3.5 mmol/L (ref 3.5–5.1)
Sodium: 139 mmol/L (ref 135–145)

## 2022-07-06 NOTE — Discharge Summary (Signed)
Physician Discharge Summary   Amanda Armstrong HRC:[id number] DOB: 08-Nov-1945 DOA: 07/03/2022  PCP: Redmond School, MD  Admit date: 07/03/2022 Discharge date: 07/06/2022  Barriers to discharge: none  Admitted From: Home Disposition:  Home Discharging physician: Dwyane Dee, MD  Recommendations for Outpatient Follow-up:  Continue routine chronic care  Home Health:  Equipment/Devices:   Discharge Condition: stable CODE STATUS: Full Diet recommendation:  Diet Orders (From admission, onward)     Start     Ordered   07/06/22 0000  Diet - low sodium heart healthy        07/06/22 1303   07/04/22 0025  Diet Heart Room service appropriate? Yes; Fluid consistency: Thin  Diet effective now       Question Answer Comment  Room service appropriate? Yes   Fluid consistency: Thin      07/04/22 0025            Hospital Course:  Acute cystitis - Presented with fever of 102.5 F.  Per ED RN, in and out catheterization showed thick urine.  Urine microscopy grossly positive for UTI.  Received IV vancomycin and cefepime in ED. completed course with Rocephin.  No urine culture sent from admission -Remained afebrile over 48 hours with resolution of abdominal pain   Dehydration: Secondary to UTI and fever.  Treated briefly with fluids   Acute kidney injury complicating stage IIIa CKD: Creatinine 1.21 in August 23 with a GFR of 46.  Presented with creatinine of 1.62.  Secondary to dehydration.  Briefly on IV fluids, now discontinued.  Creatinine has plateaued in the 1.4 range   COPD Chronic respiratory failure with hypoxia on 2 L/min home oxygen: -No signs of exacerbation.  Patient maintained on home O2 demand   Tobacco abuse: Smokes half pack per day.  Patient counseled on cessation   Type 2 diabetes mellitus ([place]) -controlled. last A1C of 5.9   Hypothyroidism Continue levothyroxine   Anemia: Unclear etiology.  Baseline hemoglobin probably in the 13-14 range.?   Hemodilution.  No active bleeding reported.  Hemoglobin stable in the 11 g range.  Mild thrombocytopenia, likely related to infectious etiology.  Stable.    Body mass index is 34.05 kg/m.   The patient's chronic medical conditions were treated accordingly per the patient's home medication regimen except as noted.  On day of discharge, patient was felt deemed stable for discharge. Patient/family member advised to call PCP or come back to ER if needed.   Principal Diagnosis: UTI (urinary tract infection)  Discharge Diagnoses: Active Hospital Problems   Diagnosis Date Noted   UTI (urinary tract infection) 01/29/2016   AKI (acute kidney injury) ([place]) 07/04/2022   Type 2 diabetes mellitus ([place]) 07/04/2022   COPD (chronic obstructive pulmonary disease) ([place]) 07/04/2022   Chronic hypoxic respiratory failure, on home oxygen therapy ([place]) 07/04/2022   Tobacco use 07/04/2022   Acute cystitis 07/04/2022   Cigarette smoker 08/04/2020   Hypothyroidism     Resolved Hospital Problems  No resolved problems to display.     Discharge Instructions     Diet - low sodium heart healthy   Complete by: As directed    Increase activity slowly   Complete by: As directed       Allergies as of 07/06/2022       Reactions   Iron Other (See Comments)   "upset stomach"   Oxycodone Other (See Comments)   "CX HER TO FELL LIKE SHE IS OUT OF HER BODY"   Sudafed [pseudoephedrine Hcl]  Other (See Comments)   "funny feeling"        Medication List     STOP taking these medications    sulfamethoxazole-trimethoprim 400-80 MG tablet Commonly known as: BACTRIM       TAKE these medications    Accu-Chek Guide test strip Generic drug: glucose blood USE TEST STRIP TO CHECK GLUCOSE TWICE DAILY BEFORE BREAKFAST AND  BEFORE BEDTIME   Accu-Chek Softclix Lancets lancets USE 1 LANCET TO CHECK GLUCOSE TWICE DAILY   acetaminophen 500 MG tablet Commonly known as: TYLENOL Take 1,000 mg by mouth every 6  (six) hours as needed for mild pain.   alendronate 70 MG tablet Commonly known as: FOSAMAX Take 70 mg by mouth once a week.   ALPRAZolam 1 MG tablet Commonly known as: XANAX Take 1 mg by mouth at bedtime.   Anoro Ellipta 62.5-25 MCG/ACT Aepb Generic drug: umeclidinium-vilanterol Inhale 1 puff into the lungs daily.   aspirin EC 81 MG tablet Take 81 mg by mouth in the morning.   atorvastatin 20 MG tablet Commonly known as: LIPITOR Take 20 mg by mouth daily at 12 noon.   blood glucose meter kit and supplies Dispense based on patient and insurance preference. Use up to four times daily as directed. (FOR ICD-10 E10.9, E11.9).   furosemide 20 MG tablet Commonly known as: LASIX Take 20 mg by mouth daily.   HYDROcodone-acetaminophen 10-325 MG tablet Commonly known as: NORCO Take 1 tablet by mouth every 4 (four) hours as needed for moderate pain.   levothyroxine 75 MCG tablet Commonly known as: SYNTHROID Take 75 mcg by mouth daily before breakfast.   OXYGEN Inhale 1 L/min into the lungs daily.   Pen Needles 3/16" 31G X 5 MM Misc 100 each by Does not apply route 2 (two) times daily.   ReliOn Pen Needles 31G X 6 MM Misc Generic drug: Insulin Pen Needle Use to inject insulin once daily.   Vitamin D3 50 MCG (2000 UT) capsule Take 2,000 Units by mouth daily.        Follow-up Information     [facility] Follow up.   Specialty: Rehabilitation Why: Someone will call you to schedule resumption of care visit. Contact information: [place]. [place] [address] [phone]               Allergies  Allergen Reactions   Iron Other (See Comments)    "upset stomach"   Oxycodone Other (See Comments)    "CX HER TO FELL LIKE SHE IS OUT OF HER BODY"   Sudafed [Pseudoephedrine Hcl] Other (See Comments)    "funny feeling"    Consultations:   Procedures:   Discharge Exam: BP (!) 116/52 (BP Location:  Right Arm)   Pulse 68   Temp (!) 97.5 F (36.4 C) (Oral)   Resp 18   Ht _0  (1.473 m)   Wt 73.9 kg   SpO2 97%   BMI 34.05 kg/m  Physical Exam Constitutional:      Appearance: Normal appearance.  HENT:     Head: Normocephalic and atraumatic.     Mouth/Throat:     Mouth: Mucous membranes are moist.  Eyes:     Extraocular Movements: Extraocular movements intact.  Cardiovascular:     Rate and Rhythm: Normal rate and regular rhythm.  Pulmonary:     Effort: Pulmonary effort is normal. No respiratory distress.     Breath sounds: Normal breath sounds.  Abdominal:  General: Bowel sounds are normal. There is no distension.     Palpations: Abdomen is soft.     Tenderness: There is no abdominal tenderness.  Musculoskeletal:        General: Normal range of motion.     Cervical back: Normal range of motion and neck supple.  Skin:    General: Skin is warm and dry.  Neurological:     Mental Status: She is alert. Mental status is at baseline.  Psychiatric:        Mood and Affect: Mood normal.      The results of significant diagnostics from this hospitalization (including imaging, microbiology, ancillary and laboratory) are listed below for reference.   Microbiology: Recent Results (from the past 240 hour(s))  Culture, blood (Routine x 2)     Status: None (Preliminary result)   Collection Time: 07/03/22 10:00 PM   Specimen: BLOOD  Result Value Ref Range Status   Specimen Description BLOOD LEFT ANTECUBITAL  Final   Special Requests   Final    BOTTLES DRAWN AEROBIC AND ANAEROBIC Blood Culture adequate volume   Culture   Final    NO GROWTH 3 DAYS Performed at [facility], [address] [place], [place] [address]    Report Status PENDING  Incomplete  Culture, blood (Routine x 2)     Status: None (Preliminary result)   Collection Time: 07/03/22 10:05 PM   Specimen: BLOOD  Result Value Ref Range Status   Specimen Description BLOOD BLOOD LEFT ARM  Final   Special  Requests   Final    BOTTLES DRAWN AEROBIC AND ANAEROBIC Blood Culture adequate volume   Culture   Final    NO GROWTH 3 DAYS Performed at [facility], [place] [address], [place],  [address]    Report Status PENDING  Incomplete  Resp panel by RT-PCR (RSV, Flu A&B, Covid) Anterior Nasal Swab     Status: None   Collection Time: 07/03/22 10:08 PM   Specimen: Anterior Nasal Swab  Result Value Ref Range Status   SARS Coronavirus 2 by RT PCR NEGATIVE NEGATIVE Final    Comment: (NOTE) SARS-CoV-2 target nucleic acids are NOT DETECTED.  The SARS-CoV-2 RNA is generally detectable in upper respiratory specimens during the acute phase of infection. The lowest concentration of SARS-CoV-2 viral copies this assay can detect is 138 copies/mL. A negative result does not preclude SARS-Cov-2 infection and should not be used as the sole basis for treatment or other patient management decisions. A negative result may occur with  improper specimen collection/handling, submission of specimen other than nasopharyngeal swab, presence of viral mutation(s) within the areas targeted by this assay, and inadequate number of viral copies(<138 copies/mL). A negative result must be combined with clinical observations, patient history, and epidemiological information. The expected result is Negative.  Fact Sheet for Patients:  EntrepreneurPulse.com.au  Fact Sheet for Healthcare Providers:  IncredibleEmployment.be  This test is no t yet approved or cleared by the [place] FDA and  has been authorized for detection and/or diagnosis of SARS-CoV-2 by FDA under an Emergency Use Authorization (EUA). This EUA will remain  in effect (meaning this test can be used) for the duration of the COVID-19 declaration under Section 564(b)(1) of the Act, 21 U.S.C.section 360bbb-3(b)(1), unless the authorization is terminated  or revoked sooner.       Influenza A by PCR  NEGATIVE NEGATIVE Final   Influenza B by PCR NEGATIVE NEGATIVE Final    Comment: (  NOTE) The Xpert Xpress SARS-CoV-2/FLU/RSV plus assay is intended as an aid in the diagnosis of influenza from Nasopharyngeal swab specimens and should not be used as a sole basis for treatment. Nasal washings and aspirates are unacceptable for Xpert Xpress SARS-CoV-2/FLU/RSV testing.  Fact Sheet for Patients: EntrepreneurPulse.com.au  Fact Sheet for Healthcare Providers: IncredibleEmployment.be  This test is not yet approved or cleared by the [place] FDA and has been authorized for detection and/or diagnosis of SARS-CoV-2 by FDA under an Emergency Use Authorization (EUA). This EUA will remain in effect (meaning this test can be used) for the duration of the COVID-19 declaration under Section 564(b)(1) of the Act, 21 U.S.C. section 360bbb-3(b)(1), unless the authorization is terminated or revoked.     Resp Syncytial Virus by PCR NEGATIVE NEGATIVE Final    Comment: (NOTE) Fact Sheet for Patients: EntrepreneurPulse.com.au  Fact Sheet for Healthcare Providers: IncredibleEmployment.be  This test is not yet approved or cleared by the [place] FDA and has been authorized for detection and/or diagnosis of SARS-CoV-2 by FDA under an Emergency Use Authorization (EUA). This EUA will remain in effect (meaning this test can be used) for the duration of the COVID-19 declaration under Section 564(b)(1) of the Act, 21 U.S.C. section 360bbb-3(b)(1), unless the authorization is terminated or revoked.  Performed at [facility], [place] [address] [place], [place] [address]      Labs: BNP (last 3 results) No results for input(s): "BNP" in the last 8760 hours. Basic Metabolic Panel: Recent Labs  Lab 07/03/22 2206 07/04/22 0500 07/05/22 0402 07/06/22 0415  NA 138 137 138 139  K 3.6 3.9 4.0 3.5  CL 107 106 105 107   CO2 _0 GLUCOSE 109* 89 87 89  BUN _1 CREATININE 1.62* 1.44* 1.40* 1.12*  CALCIUM 8.7* 8.4* 8.8* 8.6*   Liver Function Tests: Recent Labs  Lab 07/03/22 2206  AST 26  ALT 21  ALKPHOS 53  BILITOT 0.6  PROT 6.1*  ALBUMIN 3.4*   No results for input(s): "LIPASE", "AMYLASE" in the last 168 hours. No results for input(s): "AMMONIA" in the last 168 hours. CBC: Recent Labs  Lab 07/03/22 2206 07/04/22 0500 07/05/22 0402 07/06/22 0415  WBC 7.4 5.8 6.3 3.9*  NEUTROABS 5.9  --   --   --   HGB 11.4* 11.4* 11.5* 11.1*  HCT 34.5* 35.4* 35.9* 33.1*  MCV 91.0 93.7 91.8 89.9  PLT 178 131* 132* 146*   Cardiac Enzymes: No results for input(s): "CKTOTAL", "CKMB", "CKMBINDEX", "TROPONINI" in the last 168 hours. BNP: Invalid input(s): "POCBNP" CBG: No results for input(s): "GLUCAP" in the last 168 hours. D-Dimer No results for input(s): "DDIMER" in the last 72 hours. Hgb A1c No results for input(s): "HGBA1C" in the last 72 hours. Lipid Profile No results for input(s): "CHOL", "HDL", "LDLCALC", "TRIG", "CHOLHDL", "LDLDIRECT" in the last 72 hours. Thyroid function studies No results for input(s): "TSH", "T4TOTAL", "T3FREE", "THYROIDAB" in the last 72 hours.  Invalid input(s): "FREET3" Anemia work up No results for input(s): "VITAMINB12", "FOLATE", "FERRITIN", "TIBC", "IRON", "RETICCTPCT" in the last 72 hours. Urinalysis    Component Value Date/Time   COLORURINE AMBER (A) 07/03/2022 2315   APPEARANCEUR CLOUDY (A) 07/03/2022 2315   LABSPEC 1.016 07/03/2022 2315   PHURINE 6.0 07/03/2022 2315   GLUCOSEU NEGATIVE 07/03/2022 2315   HGBUR SMALL (A) 07/03/2022 2315   BILIRUBINUR NEGATIVE 07/03/2022 2315   KETONESUR NEGATIVE 07/03/2022 2315   PROTEINUR 30 (A) 07/03/2022  2315   UROBILINOGEN 0.2 08/27/2013 1130   NITRITE NEGATIVE 07/03/2022 2315   LEUKOCYTESUR LARGE (A) 07/03/2022 2315   Sepsis Labs Recent Labs  Lab 07/03/22 2206 07/04/22 0500 07/05/22 0402  07/06/22 0415  WBC 7.4 5.8 6.3 3.9*   Microbiology Recent Results (from the past 240 hour(s))  Culture, blood (Routine x 2)     Status: None (Preliminary result)   Collection Time: 07/03/22 10:00 PM   Specimen: BLOOD  Result Value Ref Range Status   Specimen Description BLOOD LEFT ANTECUBITAL  Final   Special Requests   Final    BOTTLES DRAWN AEROBIC AND ANAEROBIC Blood Culture adequate volume   Culture   Final    NO GROWTH 3 DAYS Performed at [facility], [place] [address], [place], [place] [address]    Report Status PENDING  Incomplete  Culture, blood (Routine x 2)     Status: None (Preliminary result)   Collection Time: 07/03/22 10:05 PM   Specimen: BLOOD  Result Value Ref Range Status   Specimen Description BLOOD BLOOD LEFT ARM  Final   Special Requests   Final    BOTTLES DRAWN AEROBIC AND ANAEROBIC Blood Culture adequate volume   Culture   Final    NO GROWTH 3 DAYS Performed at [facility], [place] [address], [place], [place] [address]    Report Status PENDING  Incomplete  Resp panel by RT-PCR (RSV, Flu A&B, Covid) Anterior Nasal Swab     Status: None   Collection Time: 07/03/22 10:08 PM   Specimen: Anterior Nasal Swab  Result Value Ref Range Status   SARS Coronavirus 2 by RT PCR NEGATIVE NEGATIVE Final    Comment: (NOTE) SARS-CoV-2 target nucleic acids are NOT DETECTED.  The SARS-CoV-2 RNA is generally detectable in upper respiratory specimens during the acute phase of infection. The lowest concentration of SARS-CoV-2 viral copies this assay can detect is 138 copies/mL. A negative result does not preclude SARS-Cov-2 infection and should not be used as the sole basis for treatment or other patient management decisions. A negative result may occur with  improper specimen collection/handling, submission of specimen other than nasopharyngeal swab, presence of viral mutation(s) within the areas targeted by this assay, and inadequate number of viral copies(<138  copies/mL). A negative result must be combined with clinical observations, patient history, and epidemiological information. The expected result is Negative.  Fact Sheet for Patients:  EntrepreneurPulse.com.au  Fact Sheet for Healthcare Providers:  IncredibleEmployment.be  This test is no t yet approved or cleared by the [place] FDA and  has been authorized for detection and/or diagnosis of SARS-CoV-2 by FDA under an Emergency Use Authorization (EUA). This EUA will remain  in effect (meaning this test can be used) for the duration of the COVID-19 declaration under Section 564(b)(1) of the Act, 21 U.S.C.section 360bbb-3(b)(1), unless the authorization is terminated  or revoked sooner.       Influenza A by PCR NEGATIVE NEGATIVE Final   Influenza B by PCR NEGATIVE NEGATIVE Final    Comment: (NOTE) The Xpert Xpress SARS-CoV-2/FLU/RSV plus assay is intended as an aid in the diagnosis of influenza from Nasopharyngeal swab specimens and should not be used as a sole basis for treatment. Nasal washings and aspirates are unacceptable for Xpert Xpress SARS-CoV-2/FLU/RSV testing.  Fact Sheet for Patients: EntrepreneurPulse.com.au  Fact Sheet for Healthcare Providers: IncredibleEmployment.be  This test is not yet approved or cleared by the [place] FDA and has been authorized for detection and/or  diagnosis of SARS-CoV-2 by FDA under an Emergency Use Authorization (EUA). This EUA will remain in effect (meaning this test can be used) for the duration of the COVID-19 declaration under Section 564(b)(1) of the Act, 21 U.S.C. section 360bbb-3(b)(1), unless the authorization is terminated or revoked.     Resp Syncytial Virus by PCR NEGATIVE NEGATIVE Final    Comment: (NOTE) Fact Sheet for Patients: EntrepreneurPulse.com.au  Fact Sheet for Healthcare  Providers: IncredibleEmployment.be  This test is not yet approved or cleared by the [place] FDA and has been authorized for detection and/or diagnosis of SARS-CoV-2 by FDA under an Emergency Use Authorization (EUA). This EUA will remain in effect (meaning this test can be used) for the duration of the COVID-19 declaration under Section 564(b)(1) of the Act, 21 U.S.C. section 360bbb-3(b)(1), unless the authorization is terminated or revoked.  Performed at [facility], [place] [address] [place], [place] [address]     Procedures/Studies: DG CHEST PORT 1 VIEW  Result Date: 07/04/2022 CLINICAL DATA:  Shortness of breath. EXAM: PORTABLE CHEST 1 VIEW COMPARISON:  07/03/2022. FINDINGS: The heart size and mediastinal contours are within normal limits. There is atherosclerotic calcification of the aorta. Interstitial prominence is noted bilaterally and unchanged from prior exams and likely chronic. No consolidation, effusion, or pneumothorax. No acute osseous abnormality. IMPRESSION: No active disease. Electronically Signed   By: Brett Fairy M.D.   On: 07/04/2022 20:19   DG Abd Portable 1V  Result Date: 07/04/2022 CLINICAL DATA:  Abdominal pain EXAM: PORTABLE ABDOMEN - 1 VIEW COMPARISON:  None Available. FINDINGS: Nonobstructive bowel gas pattern. Visualized osseous structures are within normal limits. IMPRESSION: Negative. Electronically Signed   By: Julian Hy M.D.   On: 07/04/2022 00:49   DG Chest 2 View  Result Date: 07/03/2022 CLINICAL DATA:  Suspected sepsis.  Altered mental status. EXAM: CHEST - 2 VIEW COMPARISON:  Chest radiograph dated January 17, 220 FINDINGS: The heart size and mediastinal contours are within normal limits. Biapical pleural/parenchymal scarring. Chronic elevation of the right hemidiaphragm. Lungs are otherwise clear without evidence of acute pulmonary process. Bilateral glenohumeral and acromioclavicular osteoarthritis. Thoracic  spondylosis. IMPRESSION: No active cardiopulmonary disease. Electronically Signed   By: Keane Police D.O.   On: 07/03/2022 22:36     Time coordinating discharge: Over 30 minutes    Dwyane Dee, MD  [facility] 07/06/2022, 4:51 PM

## 2022-07-06 NOTE — Progress Notes (Signed)
DISCHARGE NOTE HOME Amanda Armstrong to be discharged Home per MD order. Discussed diagnosis, treatment, prescriptions and follow up appointments with the patient. Medication list explained in detail. Patient verbalized knowledge and an understanding.  Skin clean, dry and intact without evidence of skin break down, no evidence of skin tears noted. IV catheters discontinued intact. Site without signs and symptoms of complications. Dressing and pressure applied. Pt denies pain at the site currently. No complaints noted. VSS. Patient advised to return top ED if symptoms return.  An After Visit Summary (AVS) was printed and given to the patient. Patient escorted via wheelchair, and discharged home via private auto.  Tresa Endo, RN

## 2022-07-06 NOTE — TOC Transition Note (Signed)
Transition of Care [facility]) - CM/SW Discharge Note   Patient Details  Name: ARMINDA FOGLIO MRN: [id number] Date of Birth: [birth year]  Transition of [facility]) CM/SW Contact:  Tom-Johnson, Hershal Coria, RN Phone Number: 07/06/2022, 3:11 PM   Clinical Narrative:     Patient is scheduled for discharge today. Home health referral/resumption of care called in to [facility] per patient's request, patient is active with their disciplines. Amy voiced acceptance, info on AVS. Daughter to transport at discharge. No further TOC needs noted.       Final next level of care: Home w Home Health Services Barriers to Discharge: Barriers Resolved   Patient Goals and CMS Choice Patient states their goals for this hospitalization and ongoing recovery are:: To return home CMS Medicare.gov Compare Post Acute Care list provided to:: Patient Choice offered to / list presented to : Patient    Discharge Placement                Patient to be transferred to facility by: Family      Discharge Plan and Services                DME Arranged: N/A DME Agency: NA       HH Arranged: PT, OT HH Agency: [facility] Date [facility] Agency Contacted: 07/05/22 Time HH Agency Contacted: 1517 Representative spoke with at [facility] Agency: Amy  Social Determinants of Health (SDOH) Interventions Transportation Interventions: Intervention Not Indicated, Inpatient TOC, Patient Resources (Friends/Family)   Readmission Risk Interventions    03/24/2021    1:36 PM  Readmission Risk Prevention Plan  Post Dischage Appt Complete  Medication Screening Complete  Transportation Screening Complete

## 2022-07-08 LAB — CULTURE, BLOOD (ROUTINE X 2)
Culture: NO GROWTH
Culture: NO GROWTH
Special Requests: ADEQUATE
Special Requests: ADEQUATE

## 2022-07-14 DIAGNOSIS — R5381 Other malaise: Secondary | ICD-10-CM | POA: Diagnosis not present

## 2022-07-14 DIAGNOSIS — M810 Age-related osteoporosis without current pathological fracture: Secondary | ICD-10-CM | POA: Diagnosis not present

## 2022-07-14 DIAGNOSIS — E039 Hypothyroidism, unspecified: Secondary | ICD-10-CM | POA: Diagnosis not present

## 2022-07-14 DIAGNOSIS — E782 Mixed hyperlipidemia: Secondary | ICD-10-CM | POA: Diagnosis not present

## 2022-07-14 DIAGNOSIS — I679 Cerebrovascular disease, unspecified: Secondary | ICD-10-CM | POA: Diagnosis not present

## 2022-07-14 DIAGNOSIS — J449 Chronic obstructive pulmonary disease, unspecified: Secondary | ICD-10-CM | POA: Diagnosis not present

## 2022-07-14 DIAGNOSIS — G894 Chronic pain syndrome: Secondary | ICD-10-CM | POA: Diagnosis not present

## 2022-07-14 DIAGNOSIS — R739 Hyperglycemia, unspecified: Secondary | ICD-10-CM | POA: Diagnosis not present

## 2022-07-14 DIAGNOSIS — M159 Polyosteoarthritis, unspecified: Secondary | ICD-10-CM | POA: Diagnosis not present

## 2022-07-14 DIAGNOSIS — N183 Chronic kidney disease, stage 3 unspecified: Secondary | ICD-10-CM | POA: Diagnosis not present

## 2022-07-17 DIAGNOSIS — J449 Chronic obstructive pulmonary disease, unspecified: Secondary | ICD-10-CM | POA: Diagnosis not present

## 2022-07-17 DIAGNOSIS — E782 Mixed hyperlipidemia: Secondary | ICD-10-CM | POA: Diagnosis not present

## 2022-07-19 DIAGNOSIS — R5381 Other malaise: Secondary | ICD-10-CM | POA: Diagnosis not present

## 2022-07-19 DIAGNOSIS — E782 Mixed hyperlipidemia: Secondary | ICD-10-CM | POA: Diagnosis not present

## 2022-07-19 DIAGNOSIS — M810 Age-related osteoporosis without current pathological fracture: Secondary | ICD-10-CM | POA: Diagnosis not present

## 2022-07-19 DIAGNOSIS — J449 Chronic obstructive pulmonary disease, unspecified: Secondary | ICD-10-CM | POA: Diagnosis not present

## 2022-07-19 DIAGNOSIS — N183 Chronic kidney disease, stage 3 unspecified: Secondary | ICD-10-CM | POA: Diagnosis not present

## 2022-07-19 DIAGNOSIS — R739 Hyperglycemia, unspecified: Secondary | ICD-10-CM | POA: Diagnosis not present

## 2022-07-19 DIAGNOSIS — E039 Hypothyroidism, unspecified: Secondary | ICD-10-CM | POA: Diagnosis not present

## 2022-07-19 DIAGNOSIS — I679 Cerebrovascular disease, unspecified: Secondary | ICD-10-CM | POA: Diagnosis not present

## 2022-07-19 DIAGNOSIS — M159 Polyosteoarthritis, unspecified: Secondary | ICD-10-CM | POA: Diagnosis not present

## 2022-07-19 DIAGNOSIS — G894 Chronic pain syndrome: Secondary | ICD-10-CM | POA: Diagnosis not present

## 2022-07-25 DIAGNOSIS — I700 Atherosclerosis of aorta: Secondary | ICD-10-CM | POA: Diagnosis not present

## 2022-07-25 DIAGNOSIS — N183 Chronic kidney disease, stage 3 unspecified: Secondary | ICD-10-CM | POA: Diagnosis not present

## 2022-07-25 DIAGNOSIS — J449 Chronic obstructive pulmonary disease, unspecified: Secondary | ICD-10-CM | POA: Diagnosis not present

## 2022-07-25 DIAGNOSIS — E1165 Type 2 diabetes mellitus with hyperglycemia: Secondary | ICD-10-CM | POA: Diagnosis not present

## 2022-07-26 DIAGNOSIS — N183 Chronic kidney disease, stage 3 unspecified: Secondary | ICD-10-CM | POA: Diagnosis not present

## 2022-07-26 DIAGNOSIS — M159 Polyosteoarthritis, unspecified: Secondary | ICD-10-CM | POA: Diagnosis not present

## 2022-07-26 DIAGNOSIS — E782 Mixed hyperlipidemia: Secondary | ICD-10-CM | POA: Diagnosis not present

## 2022-07-26 DIAGNOSIS — M810 Age-related osteoporosis without current pathological fracture: Secondary | ICD-10-CM | POA: Diagnosis not present

## 2022-07-26 DIAGNOSIS — I679 Cerebrovascular disease, unspecified: Secondary | ICD-10-CM | POA: Diagnosis not present

## 2022-07-26 DIAGNOSIS — G894 Chronic pain syndrome: Secondary | ICD-10-CM | POA: Diagnosis not present

## 2022-07-26 DIAGNOSIS — R739 Hyperglycemia, unspecified: Secondary | ICD-10-CM | POA: Diagnosis not present

## 2022-07-26 DIAGNOSIS — E039 Hypothyroidism, unspecified: Secondary | ICD-10-CM | POA: Diagnosis not present

## 2022-07-26 DIAGNOSIS — J449 Chronic obstructive pulmonary disease, unspecified: Secondary | ICD-10-CM | POA: Diagnosis not present

## 2022-07-26 DIAGNOSIS — R5381 Other malaise: Secondary | ICD-10-CM | POA: Diagnosis not present

## 2022-07-28 DIAGNOSIS — J449 Chronic obstructive pulmonary disease, unspecified: Secondary | ICD-10-CM | POA: Diagnosis not present

## 2022-07-28 DIAGNOSIS — E782 Mixed hyperlipidemia: Secondary | ICD-10-CM | POA: Diagnosis not present

## 2022-07-28 DIAGNOSIS — I679 Cerebrovascular disease, unspecified: Secondary | ICD-10-CM | POA: Diagnosis not present

## 2022-07-28 DIAGNOSIS — R739 Hyperglycemia, unspecified: Secondary | ICD-10-CM | POA: Diagnosis not present

## 2022-07-28 DIAGNOSIS — G894 Chronic pain syndrome: Secondary | ICD-10-CM | POA: Diagnosis not present

## 2022-07-28 DIAGNOSIS — E039 Hypothyroidism, unspecified: Secondary | ICD-10-CM | POA: Diagnosis not present

## 2022-07-28 DIAGNOSIS — R5381 Other malaise: Secondary | ICD-10-CM | POA: Diagnosis not present

## 2022-07-28 DIAGNOSIS — M810 Age-related osteoporosis without current pathological fracture: Secondary | ICD-10-CM | POA: Diagnosis not present

## 2022-07-28 DIAGNOSIS — M159 Polyosteoarthritis, unspecified: Secondary | ICD-10-CM | POA: Diagnosis not present

## 2022-07-28 DIAGNOSIS — N183 Chronic kidney disease, stage 3 unspecified: Secondary | ICD-10-CM | POA: Diagnosis not present

## 2022-07-31 DIAGNOSIS — J9601 Acute respiratory failure with hypoxia: Secondary | ICD-10-CM | POA: Diagnosis not present

## 2022-07-31 DIAGNOSIS — J449 Chronic obstructive pulmonary disease, unspecified: Secondary | ICD-10-CM | POA: Diagnosis not present

## 2022-07-31 DIAGNOSIS — U071 COVID-19: Secondary | ICD-10-CM | POA: Diagnosis not present

## 2022-08-01 DIAGNOSIS — M810 Age-related osteoporosis without current pathological fracture: Secondary | ICD-10-CM | POA: Diagnosis not present

## 2022-08-01 DIAGNOSIS — J449 Chronic obstructive pulmonary disease, unspecified: Secondary | ICD-10-CM | POA: Diagnosis not present

## 2022-08-01 DIAGNOSIS — M159 Polyosteoarthritis, unspecified: Secondary | ICD-10-CM | POA: Diagnosis not present

## 2022-08-01 DIAGNOSIS — E039 Hypothyroidism, unspecified: Secondary | ICD-10-CM | POA: Diagnosis not present

## 2022-08-01 DIAGNOSIS — R739 Hyperglycemia, unspecified: Secondary | ICD-10-CM | POA: Diagnosis not present

## 2022-08-01 DIAGNOSIS — N183 Chronic kidney disease, stage 3 unspecified: Secondary | ICD-10-CM | POA: Diagnosis not present

## 2022-08-01 DIAGNOSIS — I679 Cerebrovascular disease, unspecified: Secondary | ICD-10-CM | POA: Diagnosis not present

## 2022-08-01 DIAGNOSIS — E782 Mixed hyperlipidemia: Secondary | ICD-10-CM | POA: Diagnosis not present

## 2022-08-01 DIAGNOSIS — R5381 Other malaise: Secondary | ICD-10-CM | POA: Diagnosis not present

## 2022-08-01 DIAGNOSIS — G894 Chronic pain syndrome: Secondary | ICD-10-CM | POA: Diagnosis not present

## 2022-08-04 DIAGNOSIS — E782 Mixed hyperlipidemia: Secondary | ICD-10-CM | POA: Diagnosis not present

## 2022-08-04 DIAGNOSIS — I679 Cerebrovascular disease, unspecified: Secondary | ICD-10-CM | POA: Diagnosis not present

## 2022-08-04 DIAGNOSIS — J449 Chronic obstructive pulmonary disease, unspecified: Secondary | ICD-10-CM | POA: Diagnosis not present

## 2022-08-04 DIAGNOSIS — R5381 Other malaise: Secondary | ICD-10-CM | POA: Diagnosis not present

## 2022-08-04 DIAGNOSIS — N183 Chronic kidney disease, stage 3 unspecified: Secondary | ICD-10-CM | POA: Diagnosis not present

## 2022-08-04 DIAGNOSIS — M159 Polyosteoarthritis, unspecified: Secondary | ICD-10-CM | POA: Diagnosis not present

## 2022-08-04 DIAGNOSIS — M810 Age-related osteoporosis without current pathological fracture: Secondary | ICD-10-CM | POA: Diagnosis not present

## 2022-08-04 DIAGNOSIS — G894 Chronic pain syndrome: Secondary | ICD-10-CM | POA: Diagnosis not present

## 2022-08-04 DIAGNOSIS — R739 Hyperglycemia, unspecified: Secondary | ICD-10-CM | POA: Diagnosis not present

## 2022-08-04 DIAGNOSIS — E039 Hypothyroidism, unspecified: Secondary | ICD-10-CM | POA: Diagnosis not present

## 2022-08-10 DIAGNOSIS — R739 Hyperglycemia, unspecified: Secondary | ICD-10-CM | POA: Diagnosis not present

## 2022-08-10 DIAGNOSIS — M810 Age-related osteoporosis without current pathological fracture: Secondary | ICD-10-CM | POA: Diagnosis not present

## 2022-08-10 DIAGNOSIS — R5381 Other malaise: Secondary | ICD-10-CM | POA: Diagnosis not present

## 2022-08-10 DIAGNOSIS — N183 Chronic kidney disease, stage 3 unspecified: Secondary | ICD-10-CM | POA: Diagnosis not present

## 2022-08-10 DIAGNOSIS — M159 Polyosteoarthritis, unspecified: Secondary | ICD-10-CM | POA: Diagnosis not present

## 2022-08-10 DIAGNOSIS — J449 Chronic obstructive pulmonary disease, unspecified: Secondary | ICD-10-CM | POA: Diagnosis not present

## 2022-08-10 DIAGNOSIS — E782 Mixed hyperlipidemia: Secondary | ICD-10-CM | POA: Diagnosis not present

## 2022-08-10 DIAGNOSIS — E039 Hypothyroidism, unspecified: Secondary | ICD-10-CM | POA: Diagnosis not present

## 2022-08-10 DIAGNOSIS — G894 Chronic pain syndrome: Secondary | ICD-10-CM | POA: Diagnosis not present

## 2022-08-10 DIAGNOSIS — I679 Cerebrovascular disease, unspecified: Secondary | ICD-10-CM | POA: Diagnosis not present

## 2022-08-16 DIAGNOSIS — I679 Cerebrovascular disease, unspecified: Secondary | ICD-10-CM | POA: Diagnosis not present

## 2022-08-16 DIAGNOSIS — J449 Chronic obstructive pulmonary disease, unspecified: Secondary | ICD-10-CM | POA: Diagnosis not present

## 2022-08-16 DIAGNOSIS — R739 Hyperglycemia, unspecified: Secondary | ICD-10-CM | POA: Diagnosis not present

## 2022-08-16 DIAGNOSIS — E782 Mixed hyperlipidemia: Secondary | ICD-10-CM | POA: Diagnosis not present

## 2022-08-16 DIAGNOSIS — G894 Chronic pain syndrome: Secondary | ICD-10-CM | POA: Diagnosis not present

## 2022-08-16 DIAGNOSIS — M810 Age-related osteoporosis without current pathological fracture: Secondary | ICD-10-CM | POA: Diagnosis not present

## 2022-08-16 DIAGNOSIS — N183 Chronic kidney disease, stage 3 unspecified: Secondary | ICD-10-CM | POA: Diagnosis not present

## 2022-08-16 DIAGNOSIS — R5381 Other malaise: Secondary | ICD-10-CM | POA: Diagnosis not present

## 2022-08-16 DIAGNOSIS — E039 Hypothyroidism, unspecified: Secondary | ICD-10-CM | POA: Diagnosis not present

## 2022-08-16 DIAGNOSIS — M159 Polyosteoarthritis, unspecified: Secondary | ICD-10-CM | POA: Diagnosis not present

## 2022-08-17 DIAGNOSIS — E782 Mixed hyperlipidemia: Secondary | ICD-10-CM | POA: Diagnosis not present

## 2022-08-17 DIAGNOSIS — J449 Chronic obstructive pulmonary disease, unspecified: Secondary | ICD-10-CM | POA: Diagnosis not present

## 2022-08-18 DIAGNOSIS — E038 Other specified hypothyroidism: Secondary | ICD-10-CM | POA: Diagnosis not present

## 2022-08-18 DIAGNOSIS — N1831 Chronic kidney disease, stage 3a: Secondary | ICD-10-CM | POA: Diagnosis not present

## 2022-08-18 DIAGNOSIS — E1122 Type 2 diabetes mellitus with diabetic chronic kidney disease: Secondary | ICD-10-CM | POA: Diagnosis not present

## 2022-08-18 DIAGNOSIS — E063 Autoimmune thyroiditis: Secondary | ICD-10-CM | POA: Diagnosis not present

## 2022-08-19 LAB — COMPREHENSIVE METABOLIC PANEL
ALT: 28 IU/L (ref 0–32)
AST: 22 IU/L (ref 0–40)
Albumin/Globulin Ratio: 1.6 (ref 1.2–2.2)
Albumin: 4.2 g/dL (ref 3.8–4.8)
Alkaline Phosphatase: 81 IU/L (ref 44–121)
BUN/Creatinine Ratio: 13 (ref 12–28)
BUN: 19 mg/dL (ref 8–27)
Bilirubin Total: 0.3 mg/dL (ref 0.0–1.2)
CO2: 23 mmol/L (ref 20–29)
Calcium: 9.6 mg/dL (ref 8.7–10.3)
Chloride: 101 mmol/L (ref 96–106)
Creatinine, Ser: 1.47 mg/dL — ABNORMAL HIGH (ref 0.57–1.00)
Globulin, Total: 2.7 g/dL (ref 1.5–4.5)
Glucose: 74 mg/dL (ref 70–99)
Potassium: 4.3 mmol/L (ref 3.5–5.2)
Sodium: 141 mmol/L (ref 134–144)
Total Protein: 6.9 g/dL (ref 6.0–8.5)
eGFR: 36 mL/min/{1.73_m2} — ABNORMAL LOW (ref >59)

## 2022-08-19 LAB — TSH: TSH: 2.64 u[IU]/mL (ref 0.450–4.500)

## 2022-08-19 LAB — T4, FREE: Free T4: 1.38 ng/dL (ref 0.82–1.77)

## 2022-08-23 ENCOUNTER — Other Ambulatory Visit: Payer: Self-pay | Admitting: Nurse Practitioner

## 2022-08-23 DIAGNOSIS — E782 Mixed hyperlipidemia: Secondary | ICD-10-CM | POA: Diagnosis not present

## 2022-08-23 DIAGNOSIS — N183 Chronic kidney disease, stage 3 unspecified: Secondary | ICD-10-CM | POA: Diagnosis not present

## 2022-08-23 DIAGNOSIS — R739 Hyperglycemia, unspecified: Secondary | ICD-10-CM | POA: Diagnosis not present

## 2022-08-23 DIAGNOSIS — M159 Polyosteoarthritis, unspecified: Secondary | ICD-10-CM | POA: Diagnosis not present

## 2022-08-23 DIAGNOSIS — E039 Hypothyroidism, unspecified: Secondary | ICD-10-CM | POA: Diagnosis not present

## 2022-08-23 DIAGNOSIS — I679 Cerebrovascular disease, unspecified: Secondary | ICD-10-CM | POA: Diagnosis not present

## 2022-08-23 DIAGNOSIS — M810 Age-related osteoporosis without current pathological fracture: Secondary | ICD-10-CM | POA: Diagnosis not present

## 2022-08-23 DIAGNOSIS — G894 Chronic pain syndrome: Secondary | ICD-10-CM | POA: Diagnosis not present

## 2022-08-23 DIAGNOSIS — R5381 Other malaise: Secondary | ICD-10-CM | POA: Diagnosis not present

## 2022-08-23 DIAGNOSIS — J449 Chronic obstructive pulmonary disease, unspecified: Secondary | ICD-10-CM | POA: Diagnosis not present

## 2022-08-24 NOTE — Patient Instructions (Signed)

## 2022-08-25 DIAGNOSIS — M810 Age-related osteoporosis without current pathological fracture: Secondary | ICD-10-CM | POA: Diagnosis not present

## 2022-08-25 DIAGNOSIS — J449 Chronic obstructive pulmonary disease, unspecified: Secondary | ICD-10-CM | POA: Diagnosis not present

## 2022-08-25 DIAGNOSIS — N183 Chronic kidney disease, stage 3 unspecified: Secondary | ICD-10-CM | POA: Diagnosis not present

## 2022-08-25 DIAGNOSIS — E039 Hypothyroidism, unspecified: Secondary | ICD-10-CM | POA: Diagnosis not present

## 2022-08-25 DIAGNOSIS — G894 Chronic pain syndrome: Secondary | ICD-10-CM | POA: Diagnosis not present

## 2022-08-25 DIAGNOSIS — R5381 Other malaise: Secondary | ICD-10-CM | POA: Diagnosis not present

## 2022-08-25 DIAGNOSIS — I679 Cerebrovascular disease, unspecified: Secondary | ICD-10-CM | POA: Diagnosis not present

## 2022-08-25 DIAGNOSIS — R739 Hyperglycemia, unspecified: Secondary | ICD-10-CM | POA: Diagnosis not present

## 2022-08-25 DIAGNOSIS — M159 Polyosteoarthritis, unspecified: Secondary | ICD-10-CM | POA: Diagnosis not present

## 2022-08-25 DIAGNOSIS — E782 Mixed hyperlipidemia: Secondary | ICD-10-CM | POA: Diagnosis not present

## 2022-08-26 ENCOUNTER — Ambulatory Visit (INDEPENDENT_AMBULATORY_CARE_PROVIDER_SITE_OTHER): Payer: 59 | Admitting: Nurse Practitioner

## 2022-08-26 ENCOUNTER — Encounter: Payer: Self-pay | Admitting: Nurse Practitioner

## 2022-08-26 VITALS — BP 98/59 | HR 75 | Ht <= 58 in

## 2022-08-26 DIAGNOSIS — E1122 Type 2 diabetes mellitus with diabetic chronic kidney disease: Secondary | ICD-10-CM | POA: Diagnosis not present

## 2022-08-26 DIAGNOSIS — N1831 Chronic kidney disease, stage 3a: Secondary | ICD-10-CM | POA: Diagnosis not present

## 2022-08-26 DIAGNOSIS — E063 Autoimmune thyroiditis: Secondary | ICD-10-CM

## 2022-08-26 DIAGNOSIS — E038 Other specified hypothyroidism: Secondary | ICD-10-CM

## 2022-08-26 DIAGNOSIS — E782 Mixed hyperlipidemia: Secondary | ICD-10-CM | POA: Diagnosis not present

## 2022-08-26 LAB — POCT GLYCOSYLATED HEMOGLOBIN (HGB A1C): Hemoglobin A1C: 6.1 % — AB (ref 4.0–5.6)

## 2022-08-26 NOTE — Progress Notes (Signed)
Endocrinology Follow Up Note       08/26/2022, 11:14 AM   Subjective:    Patient ID: Amanda Armstrong, female    DOB: 11/14/1945.  Amanda Armstrong is being seen in follow up after being seen in consultation for management of currently uncontrolled symptomatic diabetes requested by  Redmond School, MD.   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    pt stated that she has intermittent sx   Clostridium difficile infection    Daytime somnolence    Frequent falls    Headache(784.0)    SINUS HEADACHE OCCASSIONALLY   Hypertension    Hypothyroidism    Multifactorial gait disorder    Osteoporosis    Right foot drop    Snoring    Stroke ([place])    Tobacco use    Vertigo    Weakness     Past Surgical History:  Procedure Laterality Date   CATARACT EXTRACTION W/PHACO Right 06/28/2016   Procedure: CATARACT EXTRACTION PHACO AND INTRAOCULAR LENS PLACEMENT ([place]);  Surgeon: Rutherford Guys, MD;  Location: [facility];  Service: Ophthalmology;  Laterality: Right;  CDE: 11.10   CATARACT EXTRACTION W/PHACO Left 07/26/2016   Procedure: CATARACT EXTRACTION PHACO AND INTRAOCULAR LENS PLACEMENT ([facility]);  Surgeon: Rutherford Guys, MD;  Location: [facility];  Service: Ophthalmology;  Laterality: Left;  CDE: 8.88   JOINT REPLACEMENT  11/08/3010   right total knee   TOTAL KNEE ARTHROPLASTY  11/07/2011   Procedure: TOTAL KNEE ARTHROPLASTY;  Surgeon: Lorn Junes, MD;  Location: [facility];  Service: Orthopedics;  Laterality: Left;  DR Ephriam Knuckles 48 MINUTES FOR THIS CASE   TOTAL KNEE ARTHROPLASTY Right 2011   TUBAL LIGATION  1982    Social History   Socioeconomic History   Marital status: Married    Spouse name: Jeneen Rinks   Number of children: 4   Years of education: [employer]   Highest education level: Not on file   Occupational History   Not on file  Tobacco Use   Smoking status: Every Day    Packs/day: 1.00    Years: 48.00    Total pack years: 48.00    Types: Cigarettes   Smokeless tobacco: Never   Tobacco comments:    12/29/14 1/2 PPD  Vaping Use   Vaping Use: Never used  Substance and Sexual Activity   Alcohol use: No    Alcohol/week: 0.0 standard drinks of alcohol   Drug use: No   Sexual activity: Never    Birth control/protection: Surgical  Other Topics Concern   Not on file  Social History Narrative   1 cup of coffee a day    Social Determinants of Health   Financial Resource Strain: Not on file  Food Insecurity: No Food Insecurity (07/05/2022)   Hunger Vital Sign    Worried About Running Out of Food in the Last Year: Never true    Ran Out of Food in the Last Year: Never true  Transportation Needs: No Transportation Needs (07/05/2022)   PRAPARE -  Hydrologist (Medical): No    Lack of Transportation (Non-Medical): No  Physical Activity: Not on file  Stress: Not on file  Social Connections: Not on file    Family History  Problem Relation Age of Onset   Heart attack Mother    COPD Father    Arthritis Sister    Heart disease Brother     Outpatient Encounter Medications as of 08/26/2022  Medication Sig   ACCU-CHEK GUIDE test strip USE TEST STRIP TO CHECK GLUCOSE TWICE DAILY BEFORE BREAKFAST AND  BEFORE BEDTIME   Accu-Chek Softclix Lancets lancets USE 1  TO CHECK GLUCOSE TWICE DAILY   acetaminophen (TYLENOL) 500 MG tablet Take 1,000 mg by mouth every 6 (six) hours as needed for mild pain.   alendronate (FOSAMAX) 70 MG tablet Take 70 mg by mouth once a week.   ALPRAZolam (XANAX) 1 MG tablet Take 1 mg by mouth at bedtime.   ANORO ELLIPTA 62.5-25 MCG/ACT AEPB Inhale 1 puff into the lungs daily.   aspirin 81 MG EC tablet Take 81 mg by mouth in the morning.   atorvastatin (LIPITOR) 20 MG tablet Take 20 mg by mouth daily at 12 noon.   blood glucose  meter kit and supplies Dispense based on patient and insurance preference. Use up to four times daily as directed. (FOR ICD-10 E10.9, E11.9).   Cholecalciferol (VITAMIN D3) 50 MCG (2000 UT) capsule Take 2,000 Units by mouth daily.   furosemide (LASIX) 20 MG tablet Take 20 mg by mouth daily.   HYDROcodone-acetaminophen (NORCO) 10-325 MG tablet Take 1 tablet by mouth every 4 (four) hours as needed for moderate pain.   Insulin Pen Needle (PEN NEEDLES 3/16") 31G X 5 MM MISC 100 each by Does not apply route 2 (two) times daily.   levothyroxine (SYNTHROID) 75 MCG tablet Take 75 mcg by mouth daily before breakfast.   OXYGEN Inhale 1 L/min into the lungs daily.   RELION PEN NEEDLES 31G X 6 MM MISC Use to inject insulin once daily.   [DISCONTINUED] Accu-Chek Softclix Lancets lancets USE 1 LANCET TO CHECK GLUCOSE TWICE DAILY   No facility-administered encounter medications on file as of 08/26/2022.    ALLERGIES: Allergies  Allergen Reactions   Iron Other (See Comments)    "upset stomach"   Oxycodone Other (See Comments)    "CX HER TO FELL LIKE SHE IS OUT OF HER BODY"   Sudafed [Pseudoephedrine Hcl] Other (See Comments)    "funny feeling"    VACCINATION STATUS:  There is no immunization history on file for this patient.  Diabetes She presents for her follow-up diabetic visit. She has type 2 diabetes mellitus. Her disease course has been stable. There are no hypoglycemic associated symptoms. There are no diabetic associated symptoms. Pertinent negatives for diabetes include no fatigue. There are no hypoglycemic complications. Symptoms are stable. Diabetic complications include nephropathy. Risk factors for coronary artery disease include diabetes mellitus, dyslipidemia, obesity, sedentary lifestyle, family history, tobacco exposure and post-menopausal. When asked about current treatments, none were reported. She is following a generally healthy diet. When asked about meal planning, she reported none.  She has not had a previous visit with a dietitian. She never (unable to engage in routine physical activity due to mobility deficits) participates in exercise. Her home blood glucose trend is fluctuating minimally. Her overall blood glucose range is 110-130 mg/dl. (She presents today, accompanied by her daughter and care attendant, with her meter and logs showing at target  glycemic profile overall.  Her POCT A1c today is 6.1%, increasing slightly from last visit of 5.9%.  She notes she has been hospitalized between visits for UTI and has been on steroids on/off as well.  She continues to do well without her diabetes medications. ) An ACE inhibitor/angiotensin II receptor blocker is not being taken. She does not see a podiatrist.Eye exam is not current.  Hyperlipidemia This is a chronic problem. The current episode started more than 1 year ago. The problem is uncontrolled. Recent lipid tests were reviewed and are variable. Exacerbating diseases include chronic renal disease, diabetes, hypothyroidism and obesity. Factors aggravating her hyperlipidemia include smoking and fatty foods. Current antihyperlipidemic treatment includes statins. The current treatment provides mild improvement of lipids. Compliance problems include adherence to diet and adherence to exercise.  Risk factors for coronary artery disease include diabetes mellitus, dyslipidemia, family history, obesity, a sedentary lifestyle and post-menopausal.  Thyroid Problem Presents for initial visit. Symptoms include cold intolerance and depressed mood. Patient reports no fatigue. The symptoms have been worsening. Past treatments include levothyroxine (her thyroid hormone was stopped for unknown reasons). Her past medical history is significant for diabetes, hyperlipidemia and obesity.     Review of systems  Constitutional: + Minimally fluctuating body weight, current Body mass index is 34.05 kg/m., no fatigue, no subjective hyperthermia, no  subjective hypothermia Eyes: no blurry vision, no xerophthalmia ENT: no sore throat, no nodules palpated in throat, no dysphagia/odynophagia, no hoarseness Cardiovascular: no chest pain, no shortness of breath, no palpitations, no leg swelling Respiratory: no cough, COPD Gastrointestinal: no nausea/vomiting/diarrhea Musculoskeletal: hx of chronic pain,  currently in wheelchair for deconditioning Skin: no rashes, no hyperemia Neurological: no tremors, no numbness, no tingling, no dizziness Psychiatric: no depression, no anxiety  Objective:     BP (!) 98/59 (BP Location: Left Arm, Patient Position: Sitting, Cuff Size: Normal)   Pulse 75   Ht 4' 10"$  (1.473 m)   BMI 34.05 kg/m   Wt Readings from Last 3 Encounters:  07/04/22 162 lb 14.7 oz (73.9 kg)  05/24/22 163 lb (73.9 kg)  03/25/21 163 lb 12.8 oz (74.3 kg)     BP Readings from Last 3 Encounters:  08/26/22 (!) 98/59  07/06/22 (!) 116/52  05/24/22 134/78      Physical Exam- Limited  Constitutional:  Body mass index is 34.05 kg/m. , not in acute distress, normal state of mind Eyes:  EOMI, no exophthalmos Musculoskeletal: no gross deformities, strength intact in all four extremities, no gross restriction of joint movements, essentially WC bound due to deconditioning Skin:  no rashes, no hyperemia Neurological: no tremor with outstretched hands   Diabetic Foot Exam - Simple   No data filed     CMP ( most recent) CMP     Component Value Date/Time   NA 141 08/18/2022 1200   K 4.3 08/18/2022 1200   CL 101 08/18/2022 1200   CO2 23 08/18/2022 1200   GLUCOSE 74 08/18/2022 1200   GLUCOSE 89 07/06/2022 0415   BUN 19 08/18/2022 1200   CREATININE 1.47 (H) 08/18/2022 1200   CALCIUM 9.6 08/18/2022 1200   PROT 6.9 08/18/2022 1200   ALBUMIN 4.2 08/18/2022 1200   AST 22 08/18/2022 1200   ALT 28 08/18/2022 1200   ALKPHOS 81 08/18/2022 1200   BILITOT 0.3 08/18/2022 1200   GFRNONAA 50 (L) 07/06/2022 0415   GFRAA 59.61  08/13/2020 0000     Diabetic Labs (most recent): Lab Results  Component Value Date  HGBA1C 6.1 (A) 08/26/2022   HGBA1C 5.9 02/23/2022   HGBA1C 5.7 11/18/2021     Lipid Panel ( most recent) Lipid Panel     Component Value Date/Time   CHOL 127 02/15/2022 1343   TRIG 149 02/15/2022 1343   HDL 32 (L) 02/15/2022 1343   CHOLHDL 4.0 02/15/2022 1343   CHOLHDL 5.1 03/23/2021 0441   VLDL 39 03/23/2021 0441   LDLCALC 69 02/15/2022 1343   LABVLDL 26 02/15/2022 1343      Lab Results  Component Value Date   TSH 2.640 08/18/2022   TSH 3.990 02/15/2022   TSH 6.050 (H) 11/10/2021   TSH 5.580 (H) 07/16/2021   TSH 17.800 (H) 04/06/2021   TSH 0.440 08/03/2020   TSH 0.266 (L) 09/08/2015   TSH 0.696 08/27/2013   TSH 0.025 (L) 05/27/2013   FREET4 1.38 08/18/2022   FREET4 1.39 02/15/2022   FREET4 1.43 11/10/2021   FREET4 1.45 07/16/2021   FREET4 1.36 04/06/2021           Assessment & Plan:   1) Controlled type 2 diabetes mellitus ([place])  She presents today, accompanied by her daughter and care attendant, with her meter and logs showing at target glycemic profile overall.  Her POCT A1c today is 6.1%, increasing slightly from last visit of 5.9%.  She notes she has been hospitalized between visits for UTI and has been on steroids on/off as well.  She continues to do well without her diabetes medications.   - NUVIA UHR has currently uncontrolled symptomatic type 2 DM since 77 years of age.   -Recent labs reviewed.  - I had a long discussion with her about the progressive nature of diabetes and the pathology behind its complications. -her diabetes is complicated by CKD stage 3, chronic smoking and she remains at a high risk for more acute and chronic complications which include CAD, CVA, CKD, retinopathy, and neuropathy. These are all discussed in detail with her.  - Nutritional counseling repeated at each appointment due to patients tendency to fall back in to old  habits.  - The patient admits there is a room for improvement in their diet and drink choices. -  Suggestion is made for the patient to avoid simple carbohydrates from their diet including Cakes, Sweet Desserts / Pastries, Ice Cream, Soda (diet and regular), Sweet Tea, Candies, Chips, Cookies, Sweet Pastries, Store Bought Juices, Alcohol in Excess of 1-2 drinks a day, Artificial Sweeteners, Coffee Creamer, and "Sugar-free" Products. This will help patient to have stable blood glucose profile and potentially avoid unintended weight gain.   - I encouraged the patient to switch to unprocessed or minimally processed complex starch and increased protein intake (animal or plant source), fruits, and vegetables.   - Patient is advised to stick to a routine mealtimes to eat 3 meals a day and avoid unnecessary snacks (to snack only to correct hypoglycemia).  - she will be scheduled with Jearld Fenton, RDN, CDE for diabetes education.  - I have approached her with the following individualized plan to manage her diabetes and patient agrees:   Her glycemic profile continues to be at goal without medications.  She can stay off medications at this time and continue lifestyle modifications.  She does not need to monitor glucose daily, maybe 3 times per week to keep an eye out.  Her husband has been making her check it 3-4 times per day.  -she is encouraged to continue monitoring glucose if she wishes once daily.   -  she is not a candidate for Metformin, SGLT2i due to concurrent renal insufficiency.  - she is not a candidate for incretin therapy due to heavy smoking history increasing her risk of pancreatitis.  - Specific targets for  A1c; LDL, HDL, and Triglycerides were discussed with the patient.  2) Blood Pressure /Hypertension:  her blood pressure is controlled to target.   she is advised to continue her current medications including Lasix 20 mg p.o. daily with breakfast.  3) Lipids/Hyperlipidemia:     Review of her recent lipid panel from 02/15/22 showed controlled LDL at 69.  she is advised to continue Lipitor 20 mg daily at bedtime.  Side effects and precautions discussed with her.  Will recheck lipid panel prior to next visit.  4)  Weight/Diet:  her Body mass index is 34.05 kg/m.  -  clearly complicating her diabetes care.   she is a candidate for weight loss. I discussed with her the fact that loss of 5 - 10% of her  current body weight will have the most impact on her diabetes management.  Exercise, and detailed carbohydrates information provided  -  detailed on discharge instructions.  5) Hypothyroidism r/t Hashimoto's thyroiditis Her previsit thyroid function tests are consistent with appropriate hormone replacement.  She is advised to continue Levothyroxine 75 mcg po daily before breakfast.    - The correct intake of thyroid hormone (Levothyroxine, Synthroid), is on empty stomach first thing in the morning, with water, separated by at least 30 minutes from breakfast and other medications,  and separated by more than 4 hours from calcium, iron, multivitamins, acid reflux medications (PPIs).  - This medication is a life-long medication and will be needed to correct thyroid hormone imbalances for the rest of your life.  The dose may change from time to time, based on thyroid blood work.  - It is extremely important to be consistent taking this medication, near the same time each morning.  -AVOID TAKING PRODUCTS CONTAINING BIOTIN (commonly found in Hair, Skin, Nails vitamins) AS IT INTERFERES WITH THE VALIDITY OF THYROID FUNCTION BLOOD TESTS.  6) Chronic Care/Health Maintenance: -she is not on ACEI/ARB and is on Statin medications and is encouraged to initiate and continue to follow up with Ophthalmology, Dentist, Podiatrist at least yearly or according to recommendations, and advised to [place]. I have recommended yearly flu vaccine and pneumonia vaccine at least every 5 years;  moderate intensity exercise for up to 150 minutes weekly; and sleep for at least 7 hours a day.  - she is advised to maintain close follow up with Redmond School, MD for primary care needs, as well as her other providers for optimal and coordinated care.     I spent  41  minutes in the care of the patient today including review of labs from [place], Lipids, Thyroid Function, Hematology (current and previous including abstractions from other facilities); face-to-face time discussing  her blood glucose readings/logs, discussing hypoglycemia and hyperglycemia episodes and symptoms, medications doses, her options of short and long term treatment based on the latest standards of care / guidelines;  discussion about incorporating lifestyle medicine;  and documenting the encounter. Risk reduction counseling performed per USPSTF guidelines to reduce obesity and cardiovascular risk factors.     Please refer to Patient Instructions for Blood Glucose Monitoring and Insulin/Medications Dosing Guide"  in media tab for additional information. Please  also refer to " Patient Self Inventory" in the Media  tab for reviewed elements of pertinent patient history.  Aliene Altes participated in the discussions, expressed understanding, and voiced agreement with the above plans.  All questions were answered to her satisfaction. she is encouraged to contact clinic should she have any questions or concerns prior to her return visit.     Follow up plan: - Return in about 6 months (around 02/24/2023) for Diabetes F/U with A1c in office, Thyroid follow up, Previsit labs.    Rayetta Pigg, [facility] [address] [place], [place] [address] Phone: [phone] Fax: [phone]  08/26/2022, 11:14 AM

## 2022-08-29 DIAGNOSIS — I679 Cerebrovascular disease, unspecified: Secondary | ICD-10-CM | POA: Diagnosis not present

## 2022-08-29 DIAGNOSIS — R5381 Other malaise: Secondary | ICD-10-CM | POA: Diagnosis not present

## 2022-08-29 DIAGNOSIS — E782 Mixed hyperlipidemia: Secondary | ICD-10-CM | POA: Diagnosis not present

## 2022-08-29 DIAGNOSIS — E039 Hypothyroidism, unspecified: Secondary | ICD-10-CM | POA: Diagnosis not present

## 2022-08-29 DIAGNOSIS — M159 Polyosteoarthritis, unspecified: Secondary | ICD-10-CM | POA: Diagnosis not present

## 2022-08-29 DIAGNOSIS — M810 Age-related osteoporosis without current pathological fracture: Secondary | ICD-10-CM | POA: Diagnosis not present

## 2022-08-29 DIAGNOSIS — R739 Hyperglycemia, unspecified: Secondary | ICD-10-CM | POA: Diagnosis not present

## 2022-08-29 DIAGNOSIS — N183 Chronic kidney disease, stage 3 unspecified: Secondary | ICD-10-CM | POA: Diagnosis not present

## 2022-08-29 DIAGNOSIS — J449 Chronic obstructive pulmonary disease, unspecified: Secondary | ICD-10-CM | POA: Diagnosis not present

## 2022-08-29 DIAGNOSIS — G894 Chronic pain syndrome: Secondary | ICD-10-CM | POA: Diagnosis not present

## 2022-08-31 DIAGNOSIS — J9601 Acute respiratory failure with hypoxia: Secondary | ICD-10-CM | POA: Diagnosis not present

## 2022-08-31 DIAGNOSIS — U071 COVID-19: Secondary | ICD-10-CM | POA: Diagnosis not present

## 2022-08-31 DIAGNOSIS — J449 Chronic obstructive pulmonary disease, unspecified: Secondary | ICD-10-CM | POA: Diagnosis not present

## 2022-09-01 DIAGNOSIS — E1165 Type 2 diabetes mellitus with hyperglycemia: Secondary | ICD-10-CM | POA: Diagnosis not present

## 2022-09-01 DIAGNOSIS — J449 Chronic obstructive pulmonary disease, unspecified: Secondary | ICD-10-CM | POA: Diagnosis not present

## 2022-09-01 DIAGNOSIS — I700 Atherosclerosis of aorta: Secondary | ICD-10-CM | POA: Diagnosis not present

## 2022-09-01 DIAGNOSIS — J9611 Chronic respiratory failure with hypoxia: Secondary | ICD-10-CM | POA: Diagnosis not present

## 2022-09-01 DIAGNOSIS — N183 Chronic kidney disease, stage 3 unspecified: Secondary | ICD-10-CM | POA: Diagnosis not present

## 2022-09-01 DIAGNOSIS — M159 Polyosteoarthritis, unspecified: Secondary | ICD-10-CM | POA: Diagnosis not present

## 2022-09-01 DIAGNOSIS — M1991 Primary osteoarthritis, unspecified site: Secondary | ICD-10-CM | POA: Diagnosis not present

## 2022-09-01 DIAGNOSIS — G319 Degenerative disease of nervous system, unspecified: Secondary | ICD-10-CM | POA: Diagnosis not present

## 2022-09-01 DIAGNOSIS — G894 Chronic pain syndrome: Secondary | ICD-10-CM | POA: Diagnosis not present

## 2022-09-02 DIAGNOSIS — I679 Cerebrovascular disease, unspecified: Secondary | ICD-10-CM | POA: Diagnosis not present

## 2022-09-02 DIAGNOSIS — N183 Chronic kidney disease, stage 3 unspecified: Secondary | ICD-10-CM | POA: Diagnosis not present

## 2022-09-02 DIAGNOSIS — R739 Hyperglycemia, unspecified: Secondary | ICD-10-CM | POA: Diagnosis not present

## 2022-09-02 DIAGNOSIS — J449 Chronic obstructive pulmonary disease, unspecified: Secondary | ICD-10-CM | POA: Diagnosis not present

## 2022-09-02 DIAGNOSIS — G894 Chronic pain syndrome: Secondary | ICD-10-CM | POA: Diagnosis not present

## 2022-09-02 DIAGNOSIS — R5381 Other malaise: Secondary | ICD-10-CM | POA: Diagnosis not present

## 2022-09-02 DIAGNOSIS — M810 Age-related osteoporosis without current pathological fracture: Secondary | ICD-10-CM | POA: Diagnosis not present

## 2022-09-02 DIAGNOSIS — M159 Polyosteoarthritis, unspecified: Secondary | ICD-10-CM | POA: Diagnosis not present

## 2022-09-02 DIAGNOSIS — E039 Hypothyroidism, unspecified: Secondary | ICD-10-CM | POA: Diagnosis not present

## 2022-09-02 DIAGNOSIS — E782 Mixed hyperlipidemia: Secondary | ICD-10-CM | POA: Diagnosis not present

## 2022-09-05 DIAGNOSIS — M810 Age-related osteoporosis without current pathological fracture: Secondary | ICD-10-CM | POA: Diagnosis not present

## 2022-09-05 DIAGNOSIS — M159 Polyosteoarthritis, unspecified: Secondary | ICD-10-CM | POA: Diagnosis not present

## 2022-09-05 DIAGNOSIS — E782 Mixed hyperlipidemia: Secondary | ICD-10-CM | POA: Diagnosis not present

## 2022-09-05 DIAGNOSIS — R739 Hyperglycemia, unspecified: Secondary | ICD-10-CM | POA: Diagnosis not present

## 2022-09-05 DIAGNOSIS — I679 Cerebrovascular disease, unspecified: Secondary | ICD-10-CM | POA: Diagnosis not present

## 2022-09-05 DIAGNOSIS — J449 Chronic obstructive pulmonary disease, unspecified: Secondary | ICD-10-CM | POA: Diagnosis not present

## 2022-09-05 DIAGNOSIS — N183 Chronic kidney disease, stage 3 unspecified: Secondary | ICD-10-CM | POA: Diagnosis not present

## 2022-09-05 DIAGNOSIS — E039 Hypothyroidism, unspecified: Secondary | ICD-10-CM | POA: Diagnosis not present

## 2022-09-05 DIAGNOSIS — G894 Chronic pain syndrome: Secondary | ICD-10-CM | POA: Diagnosis not present

## 2022-09-05 DIAGNOSIS — R5381 Other malaise: Secondary | ICD-10-CM | POA: Diagnosis not present

## 2022-09-13 DIAGNOSIS — R5381 Other malaise: Secondary | ICD-10-CM | POA: Diagnosis not present

## 2022-09-13 DIAGNOSIS — E039 Hypothyroidism, unspecified: Secondary | ICD-10-CM | POA: Diagnosis not present

## 2022-09-13 DIAGNOSIS — M810 Age-related osteoporosis without current pathological fracture: Secondary | ICD-10-CM | POA: Diagnosis not present

## 2022-09-13 DIAGNOSIS — E782 Mixed hyperlipidemia: Secondary | ICD-10-CM | POA: Diagnosis not present

## 2022-09-13 DIAGNOSIS — J449 Chronic obstructive pulmonary disease, unspecified: Secondary | ICD-10-CM | POA: Diagnosis not present

## 2022-09-13 DIAGNOSIS — R739 Hyperglycemia, unspecified: Secondary | ICD-10-CM | POA: Diagnosis not present

## 2022-09-13 DIAGNOSIS — G894 Chronic pain syndrome: Secondary | ICD-10-CM | POA: Diagnosis not present

## 2022-09-13 DIAGNOSIS — M159 Polyosteoarthritis, unspecified: Secondary | ICD-10-CM | POA: Diagnosis not present

## 2022-09-13 DIAGNOSIS — N183 Chronic kidney disease, stage 3 unspecified: Secondary | ICD-10-CM | POA: Diagnosis not present

## 2022-09-13 DIAGNOSIS — I679 Cerebrovascular disease, unspecified: Secondary | ICD-10-CM | POA: Diagnosis not present

## 2022-09-29 DIAGNOSIS — U071 COVID-19: Secondary | ICD-10-CM | POA: Diagnosis not present

## 2022-09-29 DIAGNOSIS — J449 Chronic obstructive pulmonary disease, unspecified: Secondary | ICD-10-CM | POA: Diagnosis not present

## 2022-09-29 DIAGNOSIS — N390 Urinary tract infection, site not specified: Secondary | ICD-10-CM | POA: Diagnosis not present

## 2022-09-29 DIAGNOSIS — J9601 Acute respiratory failure with hypoxia: Secondary | ICD-10-CM | POA: Diagnosis not present

## 2022-10-16 DIAGNOSIS — E782 Mixed hyperlipidemia: Secondary | ICD-10-CM | POA: Diagnosis not present

## 2022-10-16 DIAGNOSIS — J449 Chronic obstructive pulmonary disease, unspecified: Secondary | ICD-10-CM | POA: Diagnosis not present

## 2022-10-20 DIAGNOSIS — J449 Chronic obstructive pulmonary disease, unspecified: Secondary | ICD-10-CM | POA: Diagnosis not present

## 2022-10-20 DIAGNOSIS — M159 Polyosteoarthritis, unspecified: Secondary | ICD-10-CM | POA: Diagnosis not present

## 2022-10-20 DIAGNOSIS — G894 Chronic pain syndrome: Secondary | ICD-10-CM | POA: Diagnosis not present

## 2022-10-20 DIAGNOSIS — J9611 Chronic respiratory failure with hypoxia: Secondary | ICD-10-CM | POA: Diagnosis not present

## 2022-10-30 DIAGNOSIS — J9601 Acute respiratory failure with hypoxia: Secondary | ICD-10-CM | POA: Diagnosis not present

## 2022-10-30 DIAGNOSIS — U071 COVID-19: Secondary | ICD-10-CM | POA: Diagnosis not present

## 2022-10-30 DIAGNOSIS — J449 Chronic obstructive pulmonary disease, unspecified: Secondary | ICD-10-CM | POA: Diagnosis not present

## 2022-11-02 DIAGNOSIS — G319 Degenerative disease of nervous system, unspecified: Secondary | ICD-10-CM | POA: Diagnosis not present

## 2022-11-02 DIAGNOSIS — R5381 Other malaise: Secondary | ICD-10-CM | POA: Diagnosis not present

## 2022-11-15 DIAGNOSIS — J449 Chronic obstructive pulmonary disease, unspecified: Secondary | ICD-10-CM | POA: Diagnosis not present

## 2022-11-15 DIAGNOSIS — E782 Mixed hyperlipidemia: Secondary | ICD-10-CM | POA: Diagnosis not present

## 2022-11-16 DIAGNOSIS — J449 Chronic obstructive pulmonary disease, unspecified: Secondary | ICD-10-CM | POA: Diagnosis not present

## 2022-11-16 DIAGNOSIS — G894 Chronic pain syndrome: Secondary | ICD-10-CM | POA: Diagnosis not present

## 2022-11-16 DIAGNOSIS — M159 Polyosteoarthritis, unspecified: Secondary | ICD-10-CM | POA: Diagnosis not present

## 2022-11-16 DIAGNOSIS — N183 Chronic kidney disease, stage 3 unspecified: Secondary | ICD-10-CM | POA: Diagnosis not present

## 2022-11-19 DIAGNOSIS — G894 Chronic pain syndrome: Secondary | ICD-10-CM | POA: Diagnosis not present

## 2022-11-19 DIAGNOSIS — E1122 Type 2 diabetes mellitus with diabetic chronic kidney disease: Secondary | ICD-10-CM | POA: Diagnosis not present

## 2022-11-19 DIAGNOSIS — R5381 Other malaise: Secondary | ICD-10-CM | POA: Diagnosis not present

## 2022-11-19 DIAGNOSIS — J449 Chronic obstructive pulmonary disease, unspecified: Secondary | ICD-10-CM | POA: Diagnosis not present

## 2022-11-19 DIAGNOSIS — I129 Hypertensive chronic kidney disease with stage 1 through stage 4 chronic kidney disease, or unspecified chronic kidney disease: Secondary | ICD-10-CM | POA: Diagnosis not present

## 2022-11-19 DIAGNOSIS — Z7951 Long term (current) use of inhaled steroids: Secondary | ICD-10-CM | POA: Diagnosis not present

## 2022-11-19 DIAGNOSIS — E1165 Type 2 diabetes mellitus with hyperglycemia: Secondary | ICD-10-CM | POA: Diagnosis not present

## 2022-11-19 DIAGNOSIS — Z7982 Long term (current) use of aspirin: Secondary | ICD-10-CM | POA: Diagnosis not present

## 2022-11-19 DIAGNOSIS — M159 Polyosteoarthritis, unspecified: Secondary | ICD-10-CM | POA: Diagnosis not present

## 2022-11-19 DIAGNOSIS — Z9181 History of falling: Secondary | ICD-10-CM | POA: Diagnosis not present

## 2022-11-19 DIAGNOSIS — G319 Degenerative disease of nervous system, unspecified: Secondary | ICD-10-CM | POA: Diagnosis not present

## 2022-11-19 DIAGNOSIS — Z556 Problems related to health literacy: Secondary | ICD-10-CM | POA: Diagnosis not present

## 2022-11-19 DIAGNOSIS — N183 Chronic kidney disease, stage 3 unspecified: Secondary | ICD-10-CM | POA: Diagnosis not present

## 2022-11-19 DIAGNOSIS — J9611 Chronic respiratory failure with hypoxia: Secondary | ICD-10-CM | POA: Diagnosis not present

## 2022-11-19 DIAGNOSIS — Z96653 Presence of artificial knee joint, bilateral: Secondary | ICD-10-CM | POA: Diagnosis not present

## 2022-11-23 DIAGNOSIS — J9611 Chronic respiratory failure with hypoxia: Secondary | ICD-10-CM | POA: Diagnosis not present

## 2022-11-23 DIAGNOSIS — M159 Polyosteoarthritis, unspecified: Secondary | ICD-10-CM | POA: Diagnosis not present

## 2022-11-23 DIAGNOSIS — G319 Degenerative disease of nervous system, unspecified: Secondary | ICD-10-CM | POA: Diagnosis not present

## 2022-11-23 DIAGNOSIS — Z7951 Long term (current) use of inhaled steroids: Secondary | ICD-10-CM | POA: Diagnosis not present

## 2022-11-23 DIAGNOSIS — N183 Chronic kidney disease, stage 3 unspecified: Secondary | ICD-10-CM | POA: Diagnosis not present

## 2022-11-23 DIAGNOSIS — I129 Hypertensive chronic kidney disease with stage 1 through stage 4 chronic kidney disease, or unspecified chronic kidney disease: Secondary | ICD-10-CM | POA: Diagnosis not present

## 2022-11-23 DIAGNOSIS — E1122 Type 2 diabetes mellitus with diabetic chronic kidney disease: Secondary | ICD-10-CM | POA: Diagnosis not present

## 2022-11-23 DIAGNOSIS — J449 Chronic obstructive pulmonary disease, unspecified: Secondary | ICD-10-CM | POA: Diagnosis not present

## 2022-11-23 DIAGNOSIS — Z9181 History of falling: Secondary | ICD-10-CM | POA: Diagnosis not present

## 2022-11-23 DIAGNOSIS — Z7982 Long term (current) use of aspirin: Secondary | ICD-10-CM | POA: Diagnosis not present

## 2022-11-23 DIAGNOSIS — Z96653 Presence of artificial knee joint, bilateral: Secondary | ICD-10-CM | POA: Diagnosis not present

## 2022-11-23 DIAGNOSIS — G894 Chronic pain syndrome: Secondary | ICD-10-CM | POA: Diagnosis not present

## 2022-11-23 DIAGNOSIS — Z556 Problems related to health literacy: Secondary | ICD-10-CM | POA: Diagnosis not present

## 2022-11-23 DIAGNOSIS — E1165 Type 2 diabetes mellitus with hyperglycemia: Secondary | ICD-10-CM | POA: Diagnosis not present

## 2022-11-23 DIAGNOSIS — R5381 Other malaise: Secondary | ICD-10-CM | POA: Diagnosis not present

## 2022-11-24 DIAGNOSIS — N183 Chronic kidney disease, stage 3 unspecified: Secondary | ICD-10-CM | POA: Diagnosis not present

## 2022-11-24 DIAGNOSIS — Z9181 History of falling: Secondary | ICD-10-CM | POA: Diagnosis not present

## 2022-11-24 DIAGNOSIS — E1122 Type 2 diabetes mellitus with diabetic chronic kidney disease: Secondary | ICD-10-CM | POA: Diagnosis not present

## 2022-11-24 DIAGNOSIS — G894 Chronic pain syndrome: Secondary | ICD-10-CM | POA: Diagnosis not present

## 2022-11-24 DIAGNOSIS — E1165 Type 2 diabetes mellitus with hyperglycemia: Secondary | ICD-10-CM | POA: Diagnosis not present

## 2022-11-24 DIAGNOSIS — Z7982 Long term (current) use of aspirin: Secondary | ICD-10-CM | POA: Diagnosis not present

## 2022-11-24 DIAGNOSIS — Z556 Problems related to health literacy: Secondary | ICD-10-CM | POA: Diagnosis not present

## 2022-11-24 DIAGNOSIS — M159 Polyosteoarthritis, unspecified: Secondary | ICD-10-CM | POA: Diagnosis not present

## 2022-11-24 DIAGNOSIS — R5381 Other malaise: Secondary | ICD-10-CM | POA: Diagnosis not present

## 2022-11-24 DIAGNOSIS — Z96653 Presence of artificial knee joint, bilateral: Secondary | ICD-10-CM | POA: Diagnosis not present

## 2022-11-24 DIAGNOSIS — G319 Degenerative disease of nervous system, unspecified: Secondary | ICD-10-CM | POA: Diagnosis not present

## 2022-11-24 DIAGNOSIS — J449 Chronic obstructive pulmonary disease, unspecified: Secondary | ICD-10-CM | POA: Diagnosis not present

## 2022-11-24 DIAGNOSIS — J9611 Chronic respiratory failure with hypoxia: Secondary | ICD-10-CM | POA: Diagnosis not present

## 2022-11-24 DIAGNOSIS — I129 Hypertensive chronic kidney disease with stage 1 through stage 4 chronic kidney disease, or unspecified chronic kidney disease: Secondary | ICD-10-CM | POA: Diagnosis not present

## 2022-11-24 DIAGNOSIS — Z7951 Long term (current) use of inhaled steroids: Secondary | ICD-10-CM | POA: Diagnosis not present

## 2022-11-28 DIAGNOSIS — J9611 Chronic respiratory failure with hypoxia: Secondary | ICD-10-CM | POA: Diagnosis not present

## 2022-11-28 DIAGNOSIS — G894 Chronic pain syndrome: Secondary | ICD-10-CM | POA: Diagnosis not present

## 2022-11-28 DIAGNOSIS — R5381 Other malaise: Secondary | ICD-10-CM | POA: Diagnosis not present

## 2022-11-28 DIAGNOSIS — E1165 Type 2 diabetes mellitus with hyperglycemia: Secondary | ICD-10-CM | POA: Diagnosis not present

## 2022-11-28 DIAGNOSIS — Z556 Problems related to health literacy: Secondary | ICD-10-CM | POA: Diagnosis not present

## 2022-11-28 DIAGNOSIS — M159 Polyosteoarthritis, unspecified: Secondary | ICD-10-CM | POA: Diagnosis not present

## 2022-11-28 DIAGNOSIS — G319 Degenerative disease of nervous system, unspecified: Secondary | ICD-10-CM | POA: Diagnosis not present

## 2022-11-28 DIAGNOSIS — Z7951 Long term (current) use of inhaled steroids: Secondary | ICD-10-CM | POA: Diagnosis not present

## 2022-11-28 DIAGNOSIS — Z7982 Long term (current) use of aspirin: Secondary | ICD-10-CM | POA: Diagnosis not present

## 2022-11-28 DIAGNOSIS — N183 Chronic kidney disease, stage 3 unspecified: Secondary | ICD-10-CM | POA: Diagnosis not present

## 2022-11-28 DIAGNOSIS — Z9181 History of falling: Secondary | ICD-10-CM | POA: Diagnosis not present

## 2022-11-28 DIAGNOSIS — Z96653 Presence of artificial knee joint, bilateral: Secondary | ICD-10-CM | POA: Diagnosis not present

## 2022-11-28 DIAGNOSIS — E1122 Type 2 diabetes mellitus with diabetic chronic kidney disease: Secondary | ICD-10-CM | POA: Diagnosis not present

## 2022-11-28 DIAGNOSIS — J449 Chronic obstructive pulmonary disease, unspecified: Secondary | ICD-10-CM | POA: Diagnosis not present

## 2022-11-28 DIAGNOSIS — I129 Hypertensive chronic kidney disease with stage 1 through stage 4 chronic kidney disease, or unspecified chronic kidney disease: Secondary | ICD-10-CM | POA: Diagnosis not present

## 2022-11-29 DIAGNOSIS — J9601 Acute respiratory failure with hypoxia: Secondary | ICD-10-CM | POA: Diagnosis not present

## 2022-11-29 DIAGNOSIS — U071 COVID-19: Secondary | ICD-10-CM | POA: Diagnosis not present

## 2022-11-29 DIAGNOSIS — J449 Chronic obstructive pulmonary disease, unspecified: Secondary | ICD-10-CM | POA: Diagnosis not present

## 2022-12-03 DIAGNOSIS — R5381 Other malaise: Secondary | ICD-10-CM | POA: Diagnosis not present

## 2022-12-03 DIAGNOSIS — E1165 Type 2 diabetes mellitus with hyperglycemia: Secondary | ICD-10-CM | POA: Diagnosis not present

## 2022-12-03 DIAGNOSIS — J449 Chronic obstructive pulmonary disease, unspecified: Secondary | ICD-10-CM | POA: Diagnosis not present

## 2022-12-03 DIAGNOSIS — M159 Polyosteoarthritis, unspecified: Secondary | ICD-10-CM | POA: Diagnosis not present

## 2022-12-03 DIAGNOSIS — Z9181 History of falling: Secondary | ICD-10-CM | POA: Diagnosis not present

## 2022-12-03 DIAGNOSIS — I129 Hypertensive chronic kidney disease with stage 1 through stage 4 chronic kidney disease, or unspecified chronic kidney disease: Secondary | ICD-10-CM | POA: Diagnosis not present

## 2022-12-03 DIAGNOSIS — Z556 Problems related to health literacy: Secondary | ICD-10-CM | POA: Diagnosis not present

## 2022-12-03 DIAGNOSIS — J9611 Chronic respiratory failure with hypoxia: Secondary | ICD-10-CM | POA: Diagnosis not present

## 2022-12-03 DIAGNOSIS — Z96653 Presence of artificial knee joint, bilateral: Secondary | ICD-10-CM | POA: Diagnosis not present

## 2022-12-03 DIAGNOSIS — E1122 Type 2 diabetes mellitus with diabetic chronic kidney disease: Secondary | ICD-10-CM | POA: Diagnosis not present

## 2022-12-03 DIAGNOSIS — N183 Chronic kidney disease, stage 3 unspecified: Secondary | ICD-10-CM | POA: Diagnosis not present

## 2022-12-03 DIAGNOSIS — Z7982 Long term (current) use of aspirin: Secondary | ICD-10-CM | POA: Diagnosis not present

## 2022-12-03 DIAGNOSIS — Z7951 Long term (current) use of inhaled steroids: Secondary | ICD-10-CM | POA: Diagnosis not present

## 2022-12-03 DIAGNOSIS — G319 Degenerative disease of nervous system, unspecified: Secondary | ICD-10-CM | POA: Diagnosis not present

## 2022-12-03 DIAGNOSIS — G894 Chronic pain syndrome: Secondary | ICD-10-CM | POA: Diagnosis not present

## 2022-12-07 DIAGNOSIS — Z7982 Long term (current) use of aspirin: Secondary | ICD-10-CM | POA: Diagnosis not present

## 2022-12-07 DIAGNOSIS — G319 Degenerative disease of nervous system, unspecified: Secondary | ICD-10-CM | POA: Diagnosis not present

## 2022-12-07 DIAGNOSIS — I129 Hypertensive chronic kidney disease with stage 1 through stage 4 chronic kidney disease, or unspecified chronic kidney disease: Secondary | ICD-10-CM | POA: Diagnosis not present

## 2022-12-07 DIAGNOSIS — J9611 Chronic respiratory failure with hypoxia: Secondary | ICD-10-CM | POA: Diagnosis not present

## 2022-12-07 DIAGNOSIS — Z96653 Presence of artificial knee joint, bilateral: Secondary | ICD-10-CM | POA: Diagnosis not present

## 2022-12-07 DIAGNOSIS — Z556 Problems related to health literacy: Secondary | ICD-10-CM | POA: Diagnosis not present

## 2022-12-07 DIAGNOSIS — R5381 Other malaise: Secondary | ICD-10-CM | POA: Diagnosis not present

## 2022-12-07 DIAGNOSIS — N183 Chronic kidney disease, stage 3 unspecified: Secondary | ICD-10-CM | POA: Diagnosis not present

## 2022-12-07 DIAGNOSIS — J449 Chronic obstructive pulmonary disease, unspecified: Secondary | ICD-10-CM | POA: Diagnosis not present

## 2022-12-07 DIAGNOSIS — E1122 Type 2 diabetes mellitus with diabetic chronic kidney disease: Secondary | ICD-10-CM | POA: Diagnosis not present

## 2022-12-07 DIAGNOSIS — M159 Polyosteoarthritis, unspecified: Secondary | ICD-10-CM | POA: Diagnosis not present

## 2022-12-07 DIAGNOSIS — Z7951 Long term (current) use of inhaled steroids: Secondary | ICD-10-CM | POA: Diagnosis not present

## 2022-12-07 DIAGNOSIS — E1165 Type 2 diabetes mellitus with hyperglycemia: Secondary | ICD-10-CM | POA: Diagnosis not present

## 2022-12-07 DIAGNOSIS — G894 Chronic pain syndrome: Secondary | ICD-10-CM | POA: Diagnosis not present

## 2022-12-07 DIAGNOSIS — Z9181 History of falling: Secondary | ICD-10-CM | POA: Diagnosis not present

## 2022-12-09 DIAGNOSIS — G319 Degenerative disease of nervous system, unspecified: Secondary | ICD-10-CM | POA: Diagnosis not present

## 2022-12-09 DIAGNOSIS — J9611 Chronic respiratory failure with hypoxia: Secondary | ICD-10-CM | POA: Diagnosis not present

## 2022-12-09 DIAGNOSIS — I129 Hypertensive chronic kidney disease with stage 1 through stage 4 chronic kidney disease, or unspecified chronic kidney disease: Secondary | ICD-10-CM | POA: Diagnosis not present

## 2022-12-09 DIAGNOSIS — E1165 Type 2 diabetes mellitus with hyperglycemia: Secondary | ICD-10-CM | POA: Diagnosis not present

## 2022-12-09 DIAGNOSIS — Z96653 Presence of artificial knee joint, bilateral: Secondary | ICD-10-CM | POA: Diagnosis not present

## 2022-12-09 DIAGNOSIS — G894 Chronic pain syndrome: Secondary | ICD-10-CM | POA: Diagnosis not present

## 2022-12-09 DIAGNOSIS — Z7982 Long term (current) use of aspirin: Secondary | ICD-10-CM | POA: Diagnosis not present

## 2022-12-09 DIAGNOSIS — Z9181 History of falling: Secondary | ICD-10-CM | POA: Diagnosis not present

## 2022-12-09 DIAGNOSIS — Z556 Problems related to health literacy: Secondary | ICD-10-CM | POA: Diagnosis not present

## 2022-12-09 DIAGNOSIS — M159 Polyosteoarthritis, unspecified: Secondary | ICD-10-CM | POA: Diagnosis not present

## 2022-12-09 DIAGNOSIS — J449 Chronic obstructive pulmonary disease, unspecified: Secondary | ICD-10-CM | POA: Diagnosis not present

## 2022-12-09 DIAGNOSIS — Z7951 Long term (current) use of inhaled steroids: Secondary | ICD-10-CM | POA: Diagnosis not present

## 2022-12-09 DIAGNOSIS — N183 Chronic kidney disease, stage 3 unspecified: Secondary | ICD-10-CM | POA: Diagnosis not present

## 2022-12-09 DIAGNOSIS — E1122 Type 2 diabetes mellitus with diabetic chronic kidney disease: Secondary | ICD-10-CM | POA: Diagnosis not present

## 2022-12-09 DIAGNOSIS — R5381 Other malaise: Secondary | ICD-10-CM | POA: Diagnosis not present

## 2022-12-15 DIAGNOSIS — J449 Chronic obstructive pulmonary disease, unspecified: Secondary | ICD-10-CM | POA: Diagnosis not present

## 2022-12-15 DIAGNOSIS — R5381 Other malaise: Secondary | ICD-10-CM | POA: Diagnosis not present

## 2022-12-15 DIAGNOSIS — G319 Degenerative disease of nervous system, unspecified: Secondary | ICD-10-CM | POA: Diagnosis not present

## 2022-12-15 DIAGNOSIS — Z7951 Long term (current) use of inhaled steroids: Secondary | ICD-10-CM | POA: Diagnosis not present

## 2022-12-15 DIAGNOSIS — Z96653 Presence of artificial knee joint, bilateral: Secondary | ICD-10-CM | POA: Diagnosis not present

## 2022-12-15 DIAGNOSIS — G894 Chronic pain syndrome: Secondary | ICD-10-CM | POA: Diagnosis not present

## 2022-12-15 DIAGNOSIS — Z7982 Long term (current) use of aspirin: Secondary | ICD-10-CM | POA: Diagnosis not present

## 2022-12-15 DIAGNOSIS — E1165 Type 2 diabetes mellitus with hyperglycemia: Secondary | ICD-10-CM | POA: Diagnosis not present

## 2022-12-15 DIAGNOSIS — M159 Polyosteoarthritis, unspecified: Secondary | ICD-10-CM | POA: Diagnosis not present

## 2022-12-15 DIAGNOSIS — N183 Chronic kidney disease, stage 3 unspecified: Secondary | ICD-10-CM | POA: Diagnosis not present

## 2022-12-15 DIAGNOSIS — Z556 Problems related to health literacy: Secondary | ICD-10-CM | POA: Diagnosis not present

## 2022-12-15 DIAGNOSIS — Z9181 History of falling: Secondary | ICD-10-CM | POA: Diagnosis not present

## 2022-12-15 DIAGNOSIS — I129 Hypertensive chronic kidney disease with stage 1 through stage 4 chronic kidney disease, or unspecified chronic kidney disease: Secondary | ICD-10-CM | POA: Diagnosis not present

## 2022-12-15 DIAGNOSIS — E1122 Type 2 diabetes mellitus with diabetic chronic kidney disease: Secondary | ICD-10-CM | POA: Diagnosis not present

## 2022-12-15 DIAGNOSIS — J9611 Chronic respiratory failure with hypoxia: Secondary | ICD-10-CM | POA: Diagnosis not present

## 2022-12-16 DIAGNOSIS — R739 Hyperglycemia, unspecified: Secondary | ICD-10-CM | POA: Diagnosis not present

## 2022-12-16 DIAGNOSIS — E782 Mixed hyperlipidemia: Secondary | ICD-10-CM | POA: Diagnosis not present

## 2022-12-16 DIAGNOSIS — J449 Chronic obstructive pulmonary disease, unspecified: Secondary | ICD-10-CM | POA: Diagnosis not present

## 2022-12-16 DIAGNOSIS — E1165 Type 2 diabetes mellitus with hyperglycemia: Secondary | ICD-10-CM | POA: Diagnosis not present

## 2022-12-17 DIAGNOSIS — G319 Degenerative disease of nervous system, unspecified: Secondary | ICD-10-CM | POA: Diagnosis not present

## 2022-12-17 DIAGNOSIS — R5381 Other malaise: Secondary | ICD-10-CM | POA: Diagnosis not present

## 2022-12-22 DIAGNOSIS — G894 Chronic pain syndrome: Secondary | ICD-10-CM | POA: Diagnosis not present

## 2022-12-22 DIAGNOSIS — J449 Chronic obstructive pulmonary disease, unspecified: Secondary | ICD-10-CM | POA: Diagnosis not present

## 2022-12-22 DIAGNOSIS — N390 Urinary tract infection, site not specified: Secondary | ICD-10-CM | POA: Diagnosis not present

## 2022-12-22 DIAGNOSIS — E1165 Type 2 diabetes mellitus with hyperglycemia: Secondary | ICD-10-CM | POA: Diagnosis not present

## 2022-12-23 DIAGNOSIS — Z9181 History of falling: Secondary | ICD-10-CM | POA: Diagnosis not present

## 2022-12-23 DIAGNOSIS — E1165 Type 2 diabetes mellitus with hyperglycemia: Secondary | ICD-10-CM | POA: Diagnosis not present

## 2022-12-23 DIAGNOSIS — I129 Hypertensive chronic kidney disease with stage 1 through stage 4 chronic kidney disease, or unspecified chronic kidney disease: Secondary | ICD-10-CM | POA: Diagnosis not present

## 2022-12-23 DIAGNOSIS — E1122 Type 2 diabetes mellitus with diabetic chronic kidney disease: Secondary | ICD-10-CM | POA: Diagnosis not present

## 2022-12-23 DIAGNOSIS — Z96653 Presence of artificial knee joint, bilateral: Secondary | ICD-10-CM | POA: Diagnosis not present

## 2022-12-23 DIAGNOSIS — M159 Polyosteoarthritis, unspecified: Secondary | ICD-10-CM | POA: Diagnosis not present

## 2022-12-23 DIAGNOSIS — N183 Chronic kidney disease, stage 3 unspecified: Secondary | ICD-10-CM | POA: Diagnosis not present

## 2022-12-23 DIAGNOSIS — Z7951 Long term (current) use of inhaled steroids: Secondary | ICD-10-CM | POA: Diagnosis not present

## 2022-12-23 DIAGNOSIS — G319 Degenerative disease of nervous system, unspecified: Secondary | ICD-10-CM | POA: Diagnosis not present

## 2022-12-23 DIAGNOSIS — Z7982 Long term (current) use of aspirin: Secondary | ICD-10-CM | POA: Diagnosis not present

## 2022-12-23 DIAGNOSIS — R5381 Other malaise: Secondary | ICD-10-CM | POA: Diagnosis not present

## 2022-12-23 DIAGNOSIS — J9611 Chronic respiratory failure with hypoxia: Secondary | ICD-10-CM | POA: Diagnosis not present

## 2022-12-23 DIAGNOSIS — G894 Chronic pain syndrome: Secondary | ICD-10-CM | POA: Diagnosis not present

## 2022-12-23 DIAGNOSIS — Z556 Problems related to health literacy: Secondary | ICD-10-CM | POA: Diagnosis not present

## 2022-12-23 DIAGNOSIS — J449 Chronic obstructive pulmonary disease, unspecified: Secondary | ICD-10-CM | POA: Diagnosis not present

## 2022-12-29 DIAGNOSIS — J449 Chronic obstructive pulmonary disease, unspecified: Secondary | ICD-10-CM | POA: Diagnosis not present

## 2022-12-29 DIAGNOSIS — G894 Chronic pain syndrome: Secondary | ICD-10-CM | POA: Diagnosis not present

## 2022-12-29 DIAGNOSIS — J9611 Chronic respiratory failure with hypoxia: Secondary | ICD-10-CM | POA: Diagnosis not present

## 2022-12-29 DIAGNOSIS — Z9181 History of falling: Secondary | ICD-10-CM | POA: Diagnosis not present

## 2022-12-29 DIAGNOSIS — Z7982 Long term (current) use of aspirin: Secondary | ICD-10-CM | POA: Diagnosis not present

## 2022-12-29 DIAGNOSIS — N183 Chronic kidney disease, stage 3 unspecified: Secondary | ICD-10-CM | POA: Diagnosis not present

## 2022-12-29 DIAGNOSIS — E1165 Type 2 diabetes mellitus with hyperglycemia: Secondary | ICD-10-CM | POA: Diagnosis not present

## 2022-12-29 DIAGNOSIS — E1122 Type 2 diabetes mellitus with diabetic chronic kidney disease: Secondary | ICD-10-CM | POA: Diagnosis not present

## 2022-12-29 DIAGNOSIS — G319 Degenerative disease of nervous system, unspecified: Secondary | ICD-10-CM | POA: Diagnosis not present

## 2022-12-29 DIAGNOSIS — M159 Polyosteoarthritis, unspecified: Secondary | ICD-10-CM | POA: Diagnosis not present

## 2022-12-29 DIAGNOSIS — R5381 Other malaise: Secondary | ICD-10-CM | POA: Diagnosis not present

## 2022-12-29 DIAGNOSIS — Z7951 Long term (current) use of inhaled steroids: Secondary | ICD-10-CM | POA: Diagnosis not present

## 2022-12-29 DIAGNOSIS — Z96653 Presence of artificial knee joint, bilateral: Secondary | ICD-10-CM | POA: Diagnosis not present

## 2022-12-29 DIAGNOSIS — Z556 Problems related to health literacy: Secondary | ICD-10-CM | POA: Diagnosis not present

## 2022-12-29 DIAGNOSIS — I129 Hypertensive chronic kidney disease with stage 1 through stage 4 chronic kidney disease, or unspecified chronic kidney disease: Secondary | ICD-10-CM | POA: Diagnosis not present

## 2022-12-30 DIAGNOSIS — J449 Chronic obstructive pulmonary disease, unspecified: Secondary | ICD-10-CM | POA: Diagnosis not present

## 2022-12-30 DIAGNOSIS — U071 COVID-19: Secondary | ICD-10-CM | POA: Diagnosis not present

## 2022-12-30 DIAGNOSIS — J9601 Acute respiratory failure with hypoxia: Secondary | ICD-10-CM | POA: Diagnosis not present

## 2023-01-06 DIAGNOSIS — Z7951 Long term (current) use of inhaled steroids: Secondary | ICD-10-CM | POA: Diagnosis not present

## 2023-01-06 DIAGNOSIS — E1165 Type 2 diabetes mellitus with hyperglycemia: Secondary | ICD-10-CM | POA: Diagnosis not present

## 2023-01-06 DIAGNOSIS — M159 Polyosteoarthritis, unspecified: Secondary | ICD-10-CM | POA: Diagnosis not present

## 2023-01-06 DIAGNOSIS — J9611 Chronic respiratory failure with hypoxia: Secondary | ICD-10-CM | POA: Diagnosis not present

## 2023-01-06 DIAGNOSIS — Z556 Problems related to health literacy: Secondary | ICD-10-CM | POA: Diagnosis not present

## 2023-01-06 DIAGNOSIS — G319 Degenerative disease of nervous system, unspecified: Secondary | ICD-10-CM | POA: Diagnosis not present

## 2023-01-06 DIAGNOSIS — R5381 Other malaise: Secondary | ICD-10-CM | POA: Diagnosis not present

## 2023-01-06 DIAGNOSIS — E1122 Type 2 diabetes mellitus with diabetic chronic kidney disease: Secondary | ICD-10-CM | POA: Diagnosis not present

## 2023-01-06 DIAGNOSIS — I129 Hypertensive chronic kidney disease with stage 1 through stage 4 chronic kidney disease, or unspecified chronic kidney disease: Secondary | ICD-10-CM | POA: Diagnosis not present

## 2023-01-06 DIAGNOSIS — Z7982 Long term (current) use of aspirin: Secondary | ICD-10-CM | POA: Diagnosis not present

## 2023-01-06 DIAGNOSIS — J449 Chronic obstructive pulmonary disease, unspecified: Secondary | ICD-10-CM | POA: Diagnosis not present

## 2023-01-06 DIAGNOSIS — N183 Chronic kidney disease, stage 3 unspecified: Secondary | ICD-10-CM | POA: Diagnosis not present

## 2023-01-06 DIAGNOSIS — G894 Chronic pain syndrome: Secondary | ICD-10-CM | POA: Diagnosis not present

## 2023-01-06 DIAGNOSIS — Z96653 Presence of artificial knee joint, bilateral: Secondary | ICD-10-CM | POA: Diagnosis not present

## 2023-01-06 DIAGNOSIS — Z9181 History of falling: Secondary | ICD-10-CM | POA: Diagnosis not present

## 2023-01-12 DIAGNOSIS — E1122 Type 2 diabetes mellitus with diabetic chronic kidney disease: Secondary | ICD-10-CM | POA: Diagnosis not present

## 2023-01-12 DIAGNOSIS — Z556 Problems related to health literacy: Secondary | ICD-10-CM | POA: Diagnosis not present

## 2023-01-12 DIAGNOSIS — I129 Hypertensive chronic kidney disease with stage 1 through stage 4 chronic kidney disease, or unspecified chronic kidney disease: Secondary | ICD-10-CM | POA: Diagnosis not present

## 2023-01-12 DIAGNOSIS — G319 Degenerative disease of nervous system, unspecified: Secondary | ICD-10-CM | POA: Diagnosis not present

## 2023-01-12 DIAGNOSIS — R5381 Other malaise: Secondary | ICD-10-CM | POA: Diagnosis not present

## 2023-01-12 DIAGNOSIS — Z7982 Long term (current) use of aspirin: Secondary | ICD-10-CM | POA: Diagnosis not present

## 2023-01-12 DIAGNOSIS — E1165 Type 2 diabetes mellitus with hyperglycemia: Secondary | ICD-10-CM | POA: Diagnosis not present

## 2023-01-12 DIAGNOSIS — Z96653 Presence of artificial knee joint, bilateral: Secondary | ICD-10-CM | POA: Diagnosis not present

## 2023-01-12 DIAGNOSIS — Z9181 History of falling: Secondary | ICD-10-CM | POA: Diagnosis not present

## 2023-01-12 DIAGNOSIS — N183 Chronic kidney disease, stage 3 unspecified: Secondary | ICD-10-CM | POA: Diagnosis not present

## 2023-01-12 DIAGNOSIS — J9611 Chronic respiratory failure with hypoxia: Secondary | ICD-10-CM | POA: Diagnosis not present

## 2023-01-12 DIAGNOSIS — J449 Chronic obstructive pulmonary disease, unspecified: Secondary | ICD-10-CM | POA: Diagnosis not present

## 2023-01-12 DIAGNOSIS — M159 Polyosteoarthritis, unspecified: Secondary | ICD-10-CM | POA: Diagnosis not present

## 2023-01-12 DIAGNOSIS — G894 Chronic pain syndrome: Secondary | ICD-10-CM | POA: Diagnosis not present

## 2023-01-12 DIAGNOSIS — Z7951 Long term (current) use of inhaled steroids: Secondary | ICD-10-CM | POA: Diagnosis not present

## 2023-01-17 DIAGNOSIS — G319 Degenerative disease of nervous system, unspecified: Secondary | ICD-10-CM | POA: Diagnosis not present

## 2023-01-17 DIAGNOSIS — Z9181 History of falling: Secondary | ICD-10-CM | POA: Diagnosis not present

## 2023-01-17 DIAGNOSIS — E1165 Type 2 diabetes mellitus with hyperglycemia: Secondary | ICD-10-CM | POA: Diagnosis not present

## 2023-01-17 DIAGNOSIS — N390 Urinary tract infection, site not specified: Secondary | ICD-10-CM | POA: Diagnosis not present

## 2023-01-17 DIAGNOSIS — G894 Chronic pain syndrome: Secondary | ICD-10-CM | POA: Diagnosis not present

## 2023-01-17 DIAGNOSIS — E1122 Type 2 diabetes mellitus with diabetic chronic kidney disease: Secondary | ICD-10-CM | POA: Diagnosis not present

## 2023-01-17 DIAGNOSIS — R5381 Other malaise: Secondary | ICD-10-CM | POA: Diagnosis not present

## 2023-01-17 DIAGNOSIS — Z556 Problems related to health literacy: Secondary | ICD-10-CM | POA: Diagnosis not present

## 2023-01-17 DIAGNOSIS — Z96653 Presence of artificial knee joint, bilateral: Secondary | ICD-10-CM | POA: Diagnosis not present

## 2023-01-17 DIAGNOSIS — Z7982 Long term (current) use of aspirin: Secondary | ICD-10-CM | POA: Diagnosis not present

## 2023-01-17 DIAGNOSIS — I129 Hypertensive chronic kidney disease with stage 1 through stage 4 chronic kidney disease, or unspecified chronic kidney disease: Secondary | ICD-10-CM | POA: Diagnosis not present

## 2023-01-17 DIAGNOSIS — J9611 Chronic respiratory failure with hypoxia: Secondary | ICD-10-CM | POA: Diagnosis not present

## 2023-01-17 DIAGNOSIS — Z7951 Long term (current) use of inhaled steroids: Secondary | ICD-10-CM | POA: Diagnosis not present

## 2023-01-17 DIAGNOSIS — N183 Chronic kidney disease, stage 3 unspecified: Secondary | ICD-10-CM | POA: Diagnosis not present

## 2023-01-17 DIAGNOSIS — M159 Polyosteoarthritis, unspecified: Secondary | ICD-10-CM | POA: Diagnosis not present

## 2023-01-17 DIAGNOSIS — J449 Chronic obstructive pulmonary disease, unspecified: Secondary | ICD-10-CM | POA: Diagnosis not present

## 2023-01-17 DIAGNOSIS — Z0001 Encounter for general adult medical examination with abnormal findings: Secondary | ICD-10-CM | POA: Diagnosis not present

## 2023-01-25 DIAGNOSIS — J449 Chronic obstructive pulmonary disease, unspecified: Secondary | ICD-10-CM | POA: Diagnosis not present

## 2023-01-25 DIAGNOSIS — Z556 Problems related to health literacy: Secondary | ICD-10-CM | POA: Diagnosis not present

## 2023-01-25 DIAGNOSIS — Z7982 Long term (current) use of aspirin: Secondary | ICD-10-CM | POA: Diagnosis not present

## 2023-01-25 DIAGNOSIS — M159 Polyosteoarthritis, unspecified: Secondary | ICD-10-CM | POA: Diagnosis not present

## 2023-01-25 DIAGNOSIS — Z96653 Presence of artificial knee joint, bilateral: Secondary | ICD-10-CM | POA: Diagnosis not present

## 2023-01-25 DIAGNOSIS — G319 Degenerative disease of nervous system, unspecified: Secondary | ICD-10-CM | POA: Diagnosis not present

## 2023-01-25 DIAGNOSIS — Z7951 Long term (current) use of inhaled steroids: Secondary | ICD-10-CM | POA: Diagnosis not present

## 2023-01-25 DIAGNOSIS — I129 Hypertensive chronic kidney disease with stage 1 through stage 4 chronic kidney disease, or unspecified chronic kidney disease: Secondary | ICD-10-CM | POA: Diagnosis not present

## 2023-01-25 DIAGNOSIS — J9611 Chronic respiratory failure with hypoxia: Secondary | ICD-10-CM | POA: Diagnosis not present

## 2023-01-25 DIAGNOSIS — Z9181 History of falling: Secondary | ICD-10-CM | POA: Diagnosis not present

## 2023-01-25 DIAGNOSIS — N183 Chronic kidney disease, stage 3 unspecified: Secondary | ICD-10-CM | POA: Diagnosis not present

## 2023-01-25 DIAGNOSIS — Z792 Long term (current) use of antibiotics: Secondary | ICD-10-CM | POA: Diagnosis not present

## 2023-01-25 DIAGNOSIS — G894 Chronic pain syndrome: Secondary | ICD-10-CM | POA: Diagnosis not present

## 2023-01-25 DIAGNOSIS — E1165 Type 2 diabetes mellitus with hyperglycemia: Secondary | ICD-10-CM | POA: Diagnosis not present

## 2023-01-25 DIAGNOSIS — E1122 Type 2 diabetes mellitus with diabetic chronic kidney disease: Secondary | ICD-10-CM | POA: Diagnosis not present

## 2023-01-26 DIAGNOSIS — Z7951 Long term (current) use of inhaled steroids: Secondary | ICD-10-CM | POA: Diagnosis not present

## 2023-01-26 DIAGNOSIS — Z7982 Long term (current) use of aspirin: Secondary | ICD-10-CM | POA: Diagnosis not present

## 2023-01-26 DIAGNOSIS — G894 Chronic pain syndrome: Secondary | ICD-10-CM | POA: Diagnosis not present

## 2023-01-26 DIAGNOSIS — J9611 Chronic respiratory failure with hypoxia: Secondary | ICD-10-CM | POA: Diagnosis not present

## 2023-01-26 DIAGNOSIS — I129 Hypertensive chronic kidney disease with stage 1 through stage 4 chronic kidney disease, or unspecified chronic kidney disease: Secondary | ICD-10-CM | POA: Diagnosis not present

## 2023-01-26 DIAGNOSIS — E1122 Type 2 diabetes mellitus with diabetic chronic kidney disease: Secondary | ICD-10-CM | POA: Diagnosis not present

## 2023-01-26 DIAGNOSIS — M159 Polyosteoarthritis, unspecified: Secondary | ICD-10-CM | POA: Diagnosis not present

## 2023-01-26 DIAGNOSIS — Z556 Problems related to health literacy: Secondary | ICD-10-CM | POA: Diagnosis not present

## 2023-01-26 DIAGNOSIS — Z96653 Presence of artificial knee joint, bilateral: Secondary | ICD-10-CM | POA: Diagnosis not present

## 2023-01-26 DIAGNOSIS — E1165 Type 2 diabetes mellitus with hyperglycemia: Secondary | ICD-10-CM | POA: Diagnosis not present

## 2023-01-26 DIAGNOSIS — Z792 Long term (current) use of antibiotics: Secondary | ICD-10-CM | POA: Diagnosis not present

## 2023-01-26 DIAGNOSIS — N183 Chronic kidney disease, stage 3 unspecified: Secondary | ICD-10-CM | POA: Diagnosis not present

## 2023-01-26 DIAGNOSIS — G319 Degenerative disease of nervous system, unspecified: Secondary | ICD-10-CM | POA: Diagnosis not present

## 2023-01-26 DIAGNOSIS — Z9181 History of falling: Secondary | ICD-10-CM | POA: Diagnosis not present

## 2023-01-26 DIAGNOSIS — J449 Chronic obstructive pulmonary disease, unspecified: Secondary | ICD-10-CM | POA: Diagnosis not present

## 2023-01-29 DIAGNOSIS — J9601 Acute respiratory failure with hypoxia: Secondary | ICD-10-CM | POA: Diagnosis not present

## 2023-01-29 DIAGNOSIS — J449 Chronic obstructive pulmonary disease, unspecified: Secondary | ICD-10-CM | POA: Diagnosis not present

## 2023-01-29 DIAGNOSIS — U071 COVID-19: Secondary | ICD-10-CM | POA: Diagnosis not present

## 2023-01-30 DIAGNOSIS — I129 Hypertensive chronic kidney disease with stage 1 through stage 4 chronic kidney disease, or unspecified chronic kidney disease: Secondary | ICD-10-CM | POA: Diagnosis not present

## 2023-01-30 DIAGNOSIS — Z7982 Long term (current) use of aspirin: Secondary | ICD-10-CM | POA: Diagnosis not present

## 2023-01-30 DIAGNOSIS — G319 Degenerative disease of nervous system, unspecified: Secondary | ICD-10-CM | POA: Diagnosis not present

## 2023-01-30 DIAGNOSIS — E1122 Type 2 diabetes mellitus with diabetic chronic kidney disease: Secondary | ICD-10-CM | POA: Diagnosis not present

## 2023-01-30 DIAGNOSIS — J9611 Chronic respiratory failure with hypoxia: Secondary | ICD-10-CM | POA: Diagnosis not present

## 2023-01-30 DIAGNOSIS — G894 Chronic pain syndrome: Secondary | ICD-10-CM | POA: Diagnosis not present

## 2023-01-30 DIAGNOSIS — Z792 Long term (current) use of antibiotics: Secondary | ICD-10-CM | POA: Diagnosis not present

## 2023-01-30 DIAGNOSIS — J449 Chronic obstructive pulmonary disease, unspecified: Secondary | ICD-10-CM | POA: Diagnosis not present

## 2023-01-30 DIAGNOSIS — N183 Chronic kidney disease, stage 3 unspecified: Secondary | ICD-10-CM | POA: Diagnosis not present

## 2023-01-30 DIAGNOSIS — Z556 Problems related to health literacy: Secondary | ICD-10-CM | POA: Diagnosis not present

## 2023-01-30 DIAGNOSIS — E1165 Type 2 diabetes mellitus with hyperglycemia: Secondary | ICD-10-CM | POA: Diagnosis not present

## 2023-01-30 DIAGNOSIS — Z9181 History of falling: Secondary | ICD-10-CM | POA: Diagnosis not present

## 2023-01-30 DIAGNOSIS — Z7951 Long term (current) use of inhaled steroids: Secondary | ICD-10-CM | POA: Diagnosis not present

## 2023-01-30 DIAGNOSIS — M159 Polyosteoarthritis, unspecified: Secondary | ICD-10-CM | POA: Diagnosis not present

## 2023-01-30 DIAGNOSIS — Z96653 Presence of artificial knee joint, bilateral: Secondary | ICD-10-CM | POA: Diagnosis not present

## 2023-02-01 DIAGNOSIS — R5381 Other malaise: Secondary | ICD-10-CM | POA: Diagnosis not present

## 2023-02-01 DIAGNOSIS — G319 Degenerative disease of nervous system, unspecified: Secondary | ICD-10-CM | POA: Diagnosis not present

## 2023-02-07 DIAGNOSIS — Z7982 Long term (current) use of aspirin: Secondary | ICD-10-CM | POA: Diagnosis not present

## 2023-02-07 DIAGNOSIS — G894 Chronic pain syndrome: Secondary | ICD-10-CM | POA: Diagnosis not present

## 2023-02-07 DIAGNOSIS — G319 Degenerative disease of nervous system, unspecified: Secondary | ICD-10-CM | POA: Diagnosis not present

## 2023-02-07 DIAGNOSIS — Z96653 Presence of artificial knee joint, bilateral: Secondary | ICD-10-CM | POA: Diagnosis not present

## 2023-02-07 DIAGNOSIS — Z9181 History of falling: Secondary | ICD-10-CM | POA: Diagnosis not present

## 2023-02-07 DIAGNOSIS — E1165 Type 2 diabetes mellitus with hyperglycemia: Secondary | ICD-10-CM | POA: Diagnosis not present

## 2023-02-07 DIAGNOSIS — J449 Chronic obstructive pulmonary disease, unspecified: Secondary | ICD-10-CM | POA: Diagnosis not present

## 2023-02-07 DIAGNOSIS — Z792 Long term (current) use of antibiotics: Secondary | ICD-10-CM | POA: Diagnosis not present

## 2023-02-07 DIAGNOSIS — Z7951 Long term (current) use of inhaled steroids: Secondary | ICD-10-CM | POA: Diagnosis not present

## 2023-02-07 DIAGNOSIS — E1122 Type 2 diabetes mellitus with diabetic chronic kidney disease: Secondary | ICD-10-CM | POA: Diagnosis not present

## 2023-02-07 DIAGNOSIS — I129 Hypertensive chronic kidney disease with stage 1 through stage 4 chronic kidney disease, or unspecified chronic kidney disease: Secondary | ICD-10-CM | POA: Diagnosis not present

## 2023-02-07 DIAGNOSIS — J9611 Chronic respiratory failure with hypoxia: Secondary | ICD-10-CM | POA: Diagnosis not present

## 2023-02-07 DIAGNOSIS — M159 Polyosteoarthritis, unspecified: Secondary | ICD-10-CM | POA: Diagnosis not present

## 2023-02-07 DIAGNOSIS — Z556 Problems related to health literacy: Secondary | ICD-10-CM | POA: Diagnosis not present

## 2023-02-07 DIAGNOSIS — N183 Chronic kidney disease, stage 3 unspecified: Secondary | ICD-10-CM | POA: Diagnosis not present

## 2023-02-09 DIAGNOSIS — I129 Hypertensive chronic kidney disease with stage 1 through stage 4 chronic kidney disease, or unspecified chronic kidney disease: Secondary | ICD-10-CM | POA: Diagnosis not present

## 2023-02-09 DIAGNOSIS — M159 Polyosteoarthritis, unspecified: Secondary | ICD-10-CM | POA: Diagnosis not present

## 2023-02-09 DIAGNOSIS — E1122 Type 2 diabetes mellitus with diabetic chronic kidney disease: Secondary | ICD-10-CM | POA: Diagnosis not present

## 2023-02-09 DIAGNOSIS — N183 Chronic kidney disease, stage 3 unspecified: Secondary | ICD-10-CM | POA: Diagnosis not present

## 2023-02-13 DIAGNOSIS — Z96653 Presence of artificial knee joint, bilateral: Secondary | ICD-10-CM | POA: Diagnosis not present

## 2023-02-13 DIAGNOSIS — Z7982 Long term (current) use of aspirin: Secondary | ICD-10-CM | POA: Diagnosis not present

## 2023-02-13 DIAGNOSIS — E1165 Type 2 diabetes mellitus with hyperglycemia: Secondary | ICD-10-CM | POA: Diagnosis not present

## 2023-02-13 DIAGNOSIS — N183 Chronic kidney disease, stage 3 unspecified: Secondary | ICD-10-CM | POA: Diagnosis not present

## 2023-02-13 DIAGNOSIS — G319 Degenerative disease of nervous system, unspecified: Secondary | ICD-10-CM | POA: Diagnosis not present

## 2023-02-13 DIAGNOSIS — Z9181 History of falling: Secondary | ICD-10-CM | POA: Diagnosis not present

## 2023-02-13 DIAGNOSIS — Z7951 Long term (current) use of inhaled steroids: Secondary | ICD-10-CM | POA: Diagnosis not present

## 2023-02-13 DIAGNOSIS — M159 Polyosteoarthritis, unspecified: Secondary | ICD-10-CM | POA: Diagnosis not present

## 2023-02-13 DIAGNOSIS — Z792 Long term (current) use of antibiotics: Secondary | ICD-10-CM | POA: Diagnosis not present

## 2023-02-13 DIAGNOSIS — E1122 Type 2 diabetes mellitus with diabetic chronic kidney disease: Secondary | ICD-10-CM | POA: Diagnosis not present

## 2023-02-13 DIAGNOSIS — J9611 Chronic respiratory failure with hypoxia: Secondary | ICD-10-CM | POA: Diagnosis not present

## 2023-02-13 DIAGNOSIS — J449 Chronic obstructive pulmonary disease, unspecified: Secondary | ICD-10-CM | POA: Diagnosis not present

## 2023-02-13 DIAGNOSIS — I129 Hypertensive chronic kidney disease with stage 1 through stage 4 chronic kidney disease, or unspecified chronic kidney disease: Secondary | ICD-10-CM | POA: Diagnosis not present

## 2023-02-13 DIAGNOSIS — Z556 Problems related to health literacy: Secondary | ICD-10-CM | POA: Diagnosis not present

## 2023-02-13 DIAGNOSIS — G894 Chronic pain syndrome: Secondary | ICD-10-CM | POA: Diagnosis not present

## 2023-02-17 DIAGNOSIS — E1122 Type 2 diabetes mellitus with diabetic chronic kidney disease: Secondary | ICD-10-CM | POA: Diagnosis not present

## 2023-02-17 DIAGNOSIS — K5732 Diverticulitis of large intestine without perforation or abscess without bleeding: Secondary | ICD-10-CM | POA: Diagnosis not present

## 2023-02-17 DIAGNOSIS — G894 Chronic pain syndrome: Secondary | ICD-10-CM | POA: Diagnosis not present

## 2023-02-17 DIAGNOSIS — J9611 Chronic respiratory failure with hypoxia: Secondary | ICD-10-CM | POA: Diagnosis not present

## 2023-02-22 DIAGNOSIS — E782 Mixed hyperlipidemia: Secondary | ICD-10-CM | POA: Diagnosis not present

## 2023-02-22 DIAGNOSIS — E1122 Type 2 diabetes mellitus with diabetic chronic kidney disease: Secondary | ICD-10-CM | POA: Diagnosis not present

## 2023-02-22 DIAGNOSIS — E038 Other specified hypothyroidism: Secondary | ICD-10-CM | POA: Diagnosis not present

## 2023-02-22 DIAGNOSIS — E559 Vitamin D deficiency, unspecified: Secondary | ICD-10-CM | POA: Diagnosis not present

## 2023-02-22 DIAGNOSIS — E063 Autoimmune thyroiditis: Secondary | ICD-10-CM | POA: Diagnosis not present

## 2023-02-22 DIAGNOSIS — N1831 Chronic kidney disease, stage 3a: Secondary | ICD-10-CM | POA: Diagnosis not present

## 2023-02-22 DIAGNOSIS — D518 Other vitamin B12 deficiency anemias: Secondary | ICD-10-CM | POA: Diagnosis not present

## 2023-02-22 DIAGNOSIS — Z0001 Encounter for general adult medical examination with abnormal findings: Secondary | ICD-10-CM | POA: Diagnosis not present

## 2023-02-22 DIAGNOSIS — G9332 Myalgic encephalomyelitis/chronic fatigue syndrome: Secondary | ICD-10-CM | POA: Diagnosis not present

## 2023-02-27 ENCOUNTER — Ambulatory Visit: Payer: 59 | Admitting: Nurse Practitioner

## 2023-02-27 ENCOUNTER — Encounter: Payer: Self-pay | Admitting: Nurse Practitioner

## 2023-02-27 VITALS — BP 94/58 | HR 63 | Ht <= 58 in

## 2023-02-27 DIAGNOSIS — E782 Mixed hyperlipidemia: Secondary | ICD-10-CM

## 2023-02-27 DIAGNOSIS — E038 Other specified hypothyroidism: Secondary | ICD-10-CM | POA: Diagnosis not present

## 2023-02-27 DIAGNOSIS — E063 Autoimmune thyroiditis: Secondary | ICD-10-CM | POA: Diagnosis not present

## 2023-02-27 DIAGNOSIS — N1831 Chronic kidney disease, stage 3a: Secondary | ICD-10-CM | POA: Diagnosis not present

## 2023-02-27 DIAGNOSIS — E1122 Type 2 diabetes mellitus with diabetic chronic kidney disease: Secondary | ICD-10-CM | POA: Diagnosis not present

## 2023-02-27 LAB — POCT GLYCOSYLATED HEMOGLOBIN (HGB A1C): Hemoglobin A1C: 5.8 % — AB (ref 4.0–5.6)

## 2023-02-27 MED ORDER — ACCU-CHEK GUIDE VI STRP: ORAL_STRIP | 2 refills | Status: AC

## 2023-02-27 MED ORDER — LEVOTHYROXINE SODIUM 75 MCG PO TABS: 75.0000 ug | ORAL_TABLET | Freq: Every day | ORAL | 1 refills | Status: AC

## 2023-02-27 MED ORDER — ACCU-CHEK SOFTCLIX LANCETS MISC: 0 refills | Status: DC

## 2023-02-27 NOTE — Progress Notes (Signed)
Endocrinology Follow Up Note       79/06/2023, 11:04 AM   Subjective:    Patient ID: Amanda Armstrong, female    DOB: [birth year].  Amanda Armstrong is being seen in follow up after being seen in consultation for management of currently uncontrolled symptomatic diabetes requested by  [name], MD.   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    pt stated that she has intermittent sx   Clostridium difficile infection    Daytime somnolence    Frequent falls    Headache(784.0)    SINUS HEADACHE OCCASSIONALLY   Hypertension    Hypothyroidism    Multifactorial gait disorder    Osteoporosis    Right foot drop    Snoring    Stroke (HCC)    Tobacco use    Vertigo    Weakness     Past Surgical History:  Procedure Laterality Date   CATARACT EXTRACTION W/PHACO Right 06/28/2016   Procedure: CATARACT EXTRACTION PHACO AND INTRAOCULAR LENS PLACEMENT (IOC);  Surgeon: Jethro Bolus, MD;  Location: [facility];  Service: Ophthalmology;  Laterality: Right;  CDE: 11.10   CATARACT EXTRACTION W/PHACO Left 07/26/2016   Procedure: CATARACT EXTRACTION PHACO AND INTRAOCULAR LENS PLACEMENT (IOC);  Surgeon: Jethro Bolus, MD;  Location: [facility];  Service: Ophthalmology;  Laterality: Left;  CDE: 8.88   JOINT REPLACEMENT  11/08/3010   right total knee   TOTAL KNEE ARTHROPLASTY  11/07/2011   Procedure: TOTAL KNEE ARTHROPLASTY;  Surgeon: Nilda Simmer, MD;  Location: [facility];  Service: Orthopedics;  Laterality: Left;  DR Anne Ng 90 MINUTES FOR THIS CASE   TOTAL KNEE ARTHROPLASTY Right 2011   TUBAL LIGATION  1982    Social History   Socioeconomic History   Marital status: Married    Spouse name: Fayrene Fearing   Number of children: 4   Years of education: High Sch[employer]   Highest education level: Not on file   Occupational History   Not on file  Tobacco Use   Smoking status: Every Day    Current packs/day: 1.00    Average packs/day: 1 pack/day for 48.0 years (48.0 ttl pk-yrs)    Types: Cigarettes   Smokeless tobacco: Never   Tobacco comments:    12/29/14 1/2 PPD  Vaping Use   Vaping status: Never Used  Substance and Sexual Activity   Alcohol use: No    Alcohol/week: 0.0 standard drinks of alcohol   Drug use: No   Sexual activity: Never    Birth control/protection: Surgical  Other Topics Concern   Not on file  Social History Narrative   1 cup of coffee a day    Social Determinants of Health   Financial Resource Strain: Not on file  Food Insecurity: No Food Insecurity (07/05/2022)   Hunger Vital Sign    Worried About Running Out of Food in the Last Year: Never true    Ran Out of Food in the Last Year: Never true  Transportation Needs: No Transportation Needs (07/05/2022)  PRAPARE - Administrator, Civil Service (Medical): No    Lack of Transportation (Non-Medical): No  Physical Activity: Not on file  Stress: Not on file  Social Connections: Not on file    Family History  Problem Relation Age of Onset   Heart attack Mother    COPD Father    Arthritis Sister    Heart disease Brother     Outpatient Encounter Medications as of 02/27/2023  Medication Sig   acetaminophen (TYLENOL) 500 MG tablet Take 1,000 mg by mouth every 6 (six) hours as needed for mild pain.   alendronate (FOSAMAX) 70 MG tablet Take 70 mg by mouth once a week.   ALPRAZolam (XANAX) 1 MG tablet Take 1 mg by mouth at bedtime.   ANORO ELLIPTA 62.5-25 MCG/ACT AEPB Inhale 1 puff into the lungs daily.   aspirin 81 MG EC tablet Take 81 mg by mouth in the morning.   atorvastatin (LIPITOR) 20 MG tablet Take 20 mg by mouth daily at 12 noon.   blood glucose meter kit and supplies Dispense based on patient and insurance preference. Use up to four times daily as directed. (FOR ICD-10 E10.9, E11.9).    Cholecalciferol (VITAMIN D3) 50 MCG (2000 UT) capsule Take 2,000 Units by mouth daily.   donepezil (ARICEPT) 5 MG tablet Take 5 mg by mouth at bedtime.   furosemide (LASIX) 20 MG tablet Take 20 mg by mouth daily.   HYDROcodone-acetaminophen (NORCO) 10-325 MG tablet Take 1 tablet by mouth every 4 (four) hours as needed for moderate pain.   Insulin Pen Needle (PEN NEEDLES 3/16") 31G X 5 MM MISC 100 each by Does not apply route 2 (two) times daily.   OXYGEN Inhale 1 L/min into the lungs daily.   RELION PEN NEEDLES 31G X 6 MM MISC Use to inject insulin once daily.   [DISCONTINUED] ACCU-CHEK GUIDE test strip USE TEST STRIP TO CHECK GLUCOSE TWICE DAILY BEFORE BREAKFAST AND  BEFORE BEDTIME   [DISCONTINUED] Accu-Chek Softclix Lancets lancets USE 1  TO CHECK GLUCOSE TWICE DAILY   [DISCONTINUED] levothyroxine (SYNTHROID) 75 MCG tablet Take 75 mcg by mouth daily before breakfast.   ACCU-CHEK GUIDE test strip USE TEST STRIP TO CHECK GLUCOSE TWICE DAILY BEFORE BREAKFAST AND  BEFORE BEDTIME   Accu-Chek Softclix Lancets lancets USE 1  TO CHECK GLUCOSE TWICE DAILY   levothyroxine (SYNTHROID) 75 MCG tablet Take 1 tablet (75 mcg total) by mouth daily before breakfast.   No facility-administered encounter medications on file as of 02/27/2023.    ALLERGIES: Allergies  Allergen Reactions   Iron Other (See Comments)    "upset stomach"   Oxycodone Other (See Comments)    "CX HER TO FELL LIKE SHE IS OUT OF HER BODY"   Sudafed [Pseudoephedrine Hcl] Other (See Comments)    "funny feeling"    VACCINATION STATUS:  There is no immunization history on file for this patient.  Diabetes She presents for her follow-up diabetic visit. She has type 2 diabetes mellitus. Her disease course has been stable. There are no hypoglycemic associated symptoms. There are no diabetic associated symptoms. Pertinent negatives for diabetes include no fatigue. There are no hypoglycemic complications. Symptoms are stable. Diabetic  complications include nephropathy. Risk factors for coronary artery disease include diabetes mellitus, dyslipidemia, obesity, sedentary lifestyle, family history, tobacco exposure and post-menopausal. When asked about current treatments, none were reported. She is following a generally healthy diet. When asked about meal planning, she reported none. She has not had  a previous visit with a dietitian. She never (unable to engage in routine physical activity due to mobility deficits) participates in exercise. Her home blood glucose trend is fluctuating minimally. Her overall blood glucose range is 110-130 mg/dl. (She presents today, accompanied by her daughter and care attendant, with her meter and logs showing at target glycemic profile overall.  Her POCT A1c today is 5.8%, improving from last visit of 6.1%. She continues to do well with lifestyle modifications alone.) An ACE inhibitor/angiotensin II receptor blocker is not being taken. She does not see a podiatrist.Eye exam is not current.  Hyperlipidemia This is a chronic problem. The current episode started more than 1 year ago. The problem is uncontrolled. Recent lipid tests were reviewed and are variable. Exacerbating diseases include chronic renal disease, diabetes, hypothyroidism and obesity. Factors aggravating her hyperlipidemia include smoking and fatty foods. Current antihyperlipidemic treatment includes statins. The current treatment provides mild improvement of lipids. Compliance problems include adherence to diet and adherence to exercise.  Risk factors for coronary artery disease include diabetes mellitus, dyslipidemia, family history, obesity, a sedentary lifestyle and post-menopausal.  Thyroid Problem Presents for initial visit. Symptoms include cold intolerance and depressed mood. Patient reports no fatigue. The symptoms have been worsening. Past treatments include levothyroxine (her thyroid hormone was stopped for unknown reasons). Her past  medical history is significant for diabetes, hyperlipidemia and obesity.     Review of systems  Constitutional: + Minimally fluctuating body weight, current Body mass index is 34.05 kg/m., no fatigue, no subjective hyperthermia, no subjective hypothermia Eyes: no blurry vision, no xerophthalmia ENT: no sore throat, no nodules palpated in throat, no dysphagia/odynophagia, no hoarseness Cardiovascular: no chest pain, no shortness of breath, no palpitations, no leg swelling Respiratory: no cough, COPD Gastrointestinal: no nausea/vomiting/diarrhea Musculoskeletal: hx of chronic pain,  currently in wheelchair for deconditioning Skin: no rashes, no hyperemia Neurological: no tremors, no numbness, no tingling, no dizziness Psychiatric: no depression, no anxiety  Objective:     BP (!) 94/58 (BP Location: Left Arm, Patient Position: Sitting, Cuff Size: Large)   Pulse 63   Ht 4\' 10"  (1.473 m)   BMI 34.05 kg/m   Wt Readings from Last 3 Encounters:  07/04/22 162 lb 14.7 oz (73.9 kg)  05/24/22 163 lb (73.9 kg)  03/25/21 163 lb 12.8 oz (74.3 kg)     BP Readings from Last 3 Encounters:  02/27/23 (!) 94/58  08/26/22 (!) 98/59  07/06/22 (!) 116/52      Physical Exam- Limited  Constitutional:  Body mass index is 34.05 kg/m. , not in acute distress, normal state of mind Eyes:  EOMI, no exophthalmos Musculoskeletal: no gross deformities, strength intact in all four extremities, no gross restriction of joint movements, essentially WC bound due to deconditioning Skin:  no rashes, no hyperemia Neurological: no tremor with outstretched hands   Diabetic Foot Exam - Simple   No data filed     CMP ( most recent) CMP     Component Value Date/Time   NA 141 02/22/2023 1223   K 4.5 02/22/2023 1223   CL 103 02/22/2023 1223   CO2 22 02/22/2023 1223   GLUCOSE 82 02/22/2023 1223   GLUCOSE 89 07/06/2022 0415   BUN 20 02/22/2023 1223   CREATININE 1.35 (H) 02/22/2023 1223   CALCIUM 9.6  02/22/2023 1223   PROT 7.2 02/22/2023 1223   ALBUMIN 4.4 02/22/2023 1223   AST 23 02/22/2023 1223   ALT 22 02/22/2023 1223   ALKPHOS 85 02/22/2023  1223   BILITOT 0.3 02/22/2023 1223   GFRNONAA 50 (L) 07/06/2022 0415   GFRAA 59.61 08/13/2020 0000     Diabetic Labs (most recent): Lab Results  Component Value Date   HGBA1C 5.8 (A) 02/27/2023   HGBA1C 6.1 (A) 08/26/2022   HGBA1C 5.9 02/23/2022     Lipid Panel ( most recent) Lipid Panel     Component Value Date/Time   CHOL 127 02/15/2022 1343   TRIG 149 02/15/2022 1343   HDL 32 (L) 02/15/2022 1343   CHOLHDL 4.0 02/15/2022 1343   CHOLHDL 5.1 03/23/2021 0441   VLDL 39 03/23/2021 0441   LDLCALC 69 02/15/2022 1343   LABVLDL 26 02/15/2022 1343      Lab Results  Component Value Date   TSH 5.780 (H) 02/22/2023   TSH 2.640 08/18/2022   TSH 3.990 02/15/2022   TSH 6.050 (H) 11/10/2021   TSH 5.580 (H) 07/16/2021   TSH 17.800 (H) 04/06/2021   TSH 0.440 08/03/2020   TSH 0.266 (L) 09/08/2015   TSH 0.696 08/27/2013   TSH 0.025 (L) 05/27/2013   FREET4 1.35 02/22/2023   FREET4 1.38 08/18/2022   FREET4 1.39 02/15/2022   FREET4 1.43 11/10/2021   FREET4 1.45 07/16/2021   FREET4 1.36 04/06/2021           Assessment & Plan:   1) Controlled type 2 diabetes mellitus (HCC)  She presents today, accompanied by her daughter and care attendant, with her meter and logs showing at target glycemic profile overall.  Her POCT A1c today is 5.8%, improving from last visit of 6.1%. She continues to do well with lifestyle modifications alone.  - JADAYAH BOLEY has currently uncontrolled symptomatic type 2 DM since 77 years of age.   -Recent labs reviewed.  - I had a long discussion with her about the progressive nature of diabetes and the pathology behind its complications. -her diabetes is complicated by CKD stage 3, chronic smoking and she remains at a high risk for more acute and chronic complications which include CAD, CVA, CKD,  retinopathy, and neuropathy. These are all discussed in detail with her.  - Nutritional counseling repeated at each appointment due to patients tendency to fall back in to old habits.  - The patient admits there is a room for improvement in their diet and drink choices. -  Suggestion is made for the patient to avoid simple carbohydrates from their diet including Cakes, Sweet Desserts / Pastries, Ice Cream, Soda (diet and regular), Sweet Tea, Candies, Chips, Cookies, Sweet Pastries, Store Bought Juices, Alcohol in Excess of 1-2 drinks a day, Artificial Sweeteners, Coffee Creamer, and "Sugar-free" Products. This will help patient to have stable blood glucose profile and potentially avoid unintended weight gain.   - I encouraged the patient to switch to unprocessed or minimally processed complex starch and increased protein intake (animal or plant source), fruits, and vegetables.   - Patient is advised to stick to a routine mealtimes to eat 3 meals a day and avoid unnecessary snacks (to snack only to correct hypoglycemia).  - she will be scheduled with Norm Salt, RDN, CDE for diabetes education.  - I have approached her with the following individualized plan to manage her diabetes and patient agrees:   Her glycemic profile continues to be at goal without medications.  She can stay off medications at this time and continue lifestyle modifications.  She does not need to monitor glucose daily, maybe 1-2 times per week to keep an eye out during  the holiday season.  Her husband has been making her check it 3-4 times per day.  -she is encouraged to continue monitoring glucose if she wishes once daily.   - she is not a candidate for Metformin, SGLT2i due to concurrent renal insufficiency.  - she is not a candidate for incretin therapy due to heavy smoking history increasing her risk of pancreatitis.  - Specific targets for  A1c; LDL, HDL, and Triglycerides were discussed with the patient.  2)  Blood Pressure /Hypertension:  her blood pressure is controlled to target.   she is advised to continue her current medications including Lasix 20 mg p.o. daily with breakfast.  3) Lipids/Hyperlipidemia:    Review of her recent lipid panel from 02/22/23 showed controlled LDL at 84 and elevated triglycerides of 210- her PCP added fish oil supplement to her regimen.  she is advised to continue Lipitor 20 mg daily at bedtime.  Side effects and precautions discussed with her.  I advised her to avoid red meats, fried foods.  4)  Weight/Diet:  her Body mass index is 34.05 kg/m.  -  clearly complicating her diabetes care.   she is a candidate for weight loss. I discussed with her the fact that loss of 5 - 10% of her  current body weight will have the most impact on her diabetes management.  Exercise, and detailed carbohydrates information provided  -  detailed on discharge instructions.  5) Hypothyroidism r/t Hashimoto's thyroiditis Her previsit thyroid function tests are consistent with appropriate hormone replacement (TSH slightly above target but FT4 is stable from last visit and she denies any symptoms of under-replacement).  She is advised to continue Levothyroxine 75 mcg po daily before breakfast.  Will recheck prior to next visit and adjust dose accordingly (if still elevated may increase dosage).   - The correct intake of thyroid hormone (Levothyroxine, Synthroid), is on empty stomach first thing in the morning, with water, separated by at least 30 minutes from breakfast and other medications,  and separated by more than 4 hours from calcium, iron, multivitamins, acid reflux medications (PPIs).  - This medication is a life-long medication and will be needed to correct thyroid hormone imbalances for the rest of your life.  The dose may change from time to time, based on thyroid blood work.  - It is extremely important to be consistent taking this medication, near the same time each morning.  -AVOID  TAKING PRODUCTS CONTAINING BIOTIN (commonly found in Hair, Skin, Nails vitamins) AS IT INTERFERES WITH THE VALIDITY OF THYROID FUNCTION BLOOD TESTS.  6) Chronic Care/Health Maintenance: -she is not on ACEI/ARB and is on Statin medications and is encouraged to initiate and continue to follow up with Ophthalmology, Dentist, Podiatrist at least yearly or according to recommendations, and advised to QUIT SMOKING. I have recommended yearly flu vaccine and pneumonia vaccine at least every 5 years; moderate intensity exercise for up to 150 minutes weekly; and sleep for at least 7 hours a day.  - she is advised to maintain close follow up with [name], MD for primary care needs, as well as her other providers for optimal and coordinated care.     I spent  31  minutes in the care of the patient today including review of labs from CMP, Lipids, Thyroid Function, Hematology (current and previous including abstractions from other facilities); face-to-face time discussing  her blood glucose readings/logs, discussing hypoglycemia and hyperglycemia episodes and symptoms, medications doses, her options of short and long  term treatment based on the latest standards of care / guidelines;  discussion about incorporating lifestyle medicine;  and documenting the encounter. Risk reduction counseling performed per USPSTF guidelines to reduce obesity and cardiovascular risk factors.     Please refer to Patient Instructions for Blood Glucose Monitoring and Insulin/Medications Dosing Guide"  in media tab for additional information. Please  also refer to " Patient Self Inventory" in the Media  tab for reviewed elements of pertinent patient history.  Trey Sailors participated in the discussions, expressed understanding, and voiced agreement with the above plans.  All questions were answered to her satisfaction. she is encouraged to contact clinic should she have any questions or concerns prior to her return  visit.     Follow up plan: - Return in about 4 months (around 06/29/2023) for Thyroid follow up, Previsit labs.   Ronny Bacon, [facility] [address] [place], [place] [address] Phone: [phone] Fax: [phone]  79/06/2023, 11:04 AM

## 2023-02-28 DIAGNOSIS — J9611 Chronic respiratory failure with hypoxia: Secondary | ICD-10-CM | POA: Diagnosis not present

## 2023-02-28 DIAGNOSIS — N183 Chronic kidney disease, stage 3 unspecified: Secondary | ICD-10-CM | POA: Diagnosis not present

## 2023-02-28 DIAGNOSIS — J449 Chronic obstructive pulmonary disease, unspecified: Secondary | ICD-10-CM | POA: Diagnosis not present

## 2023-02-28 DIAGNOSIS — Z792 Long term (current) use of antibiotics: Secondary | ICD-10-CM | POA: Diagnosis not present

## 2023-02-28 DIAGNOSIS — Z556 Problems related to health literacy: Secondary | ICD-10-CM | POA: Diagnosis not present

## 2023-02-28 DIAGNOSIS — Z96653 Presence of artificial knee joint, bilateral: Secondary | ICD-10-CM | POA: Diagnosis not present

## 2023-02-28 DIAGNOSIS — M159 Polyosteoarthritis, unspecified: Secondary | ICD-10-CM | POA: Diagnosis not present

## 2023-02-28 DIAGNOSIS — Z7951 Long term (current) use of inhaled steroids: Secondary | ICD-10-CM | POA: Diagnosis not present

## 2023-02-28 DIAGNOSIS — Z9181 History of falling: Secondary | ICD-10-CM | POA: Diagnosis not present

## 2023-02-28 DIAGNOSIS — G894 Chronic pain syndrome: Secondary | ICD-10-CM | POA: Diagnosis not present

## 2023-02-28 DIAGNOSIS — G319 Degenerative disease of nervous system, unspecified: Secondary | ICD-10-CM | POA: Diagnosis not present

## 2023-02-28 DIAGNOSIS — E1122 Type 2 diabetes mellitus with diabetic chronic kidney disease: Secondary | ICD-10-CM | POA: Diagnosis not present

## 2023-02-28 DIAGNOSIS — E1165 Type 2 diabetes mellitus with hyperglycemia: Secondary | ICD-10-CM | POA: Diagnosis not present

## 2023-02-28 DIAGNOSIS — Z7982 Long term (current) use of aspirin: Secondary | ICD-10-CM | POA: Diagnosis not present

## 2023-02-28 DIAGNOSIS — I129 Hypertensive chronic kidney disease with stage 1 through stage 4 chronic kidney disease, or unspecified chronic kidney disease: Secondary | ICD-10-CM | POA: Diagnosis not present

## 2023-03-01 DIAGNOSIS — U071 COVID-19: Secondary | ICD-10-CM | POA: Diagnosis not present

## 2023-03-01 DIAGNOSIS — J9601 Acute respiratory failure with hypoxia: Secondary | ICD-10-CM | POA: Diagnosis not present

## 2023-03-01 DIAGNOSIS — J449 Chronic obstructive pulmonary disease, unspecified: Secondary | ICD-10-CM | POA: Diagnosis not present

## 2023-03-04 DIAGNOSIS — R5381 Other malaise: Secondary | ICD-10-CM | POA: Diagnosis not present

## 2023-03-04 DIAGNOSIS — G319 Degenerative disease of nervous system, unspecified: Secondary | ICD-10-CM | POA: Diagnosis not present

## 2023-03-13 DIAGNOSIS — Z7982 Long term (current) use of aspirin: Secondary | ICD-10-CM | POA: Diagnosis not present

## 2023-03-13 DIAGNOSIS — E1122 Type 2 diabetes mellitus with diabetic chronic kidney disease: Secondary | ICD-10-CM | POA: Diagnosis not present

## 2023-03-13 DIAGNOSIS — Z792 Long term (current) use of antibiotics: Secondary | ICD-10-CM | POA: Diagnosis not present

## 2023-03-13 DIAGNOSIS — M159 Polyosteoarthritis, unspecified: Secondary | ICD-10-CM | POA: Diagnosis not present

## 2023-03-13 DIAGNOSIS — G319 Degenerative disease of nervous system, unspecified: Secondary | ICD-10-CM | POA: Diagnosis not present

## 2023-03-13 DIAGNOSIS — J9611 Chronic respiratory failure with hypoxia: Secondary | ICD-10-CM | POA: Diagnosis not present

## 2023-03-13 DIAGNOSIS — G894 Chronic pain syndrome: Secondary | ICD-10-CM | POA: Diagnosis not present

## 2023-03-13 DIAGNOSIS — Z7951 Long term (current) use of inhaled steroids: Secondary | ICD-10-CM | POA: Diagnosis not present

## 2023-03-13 DIAGNOSIS — Z96653 Presence of artificial knee joint, bilateral: Secondary | ICD-10-CM | POA: Diagnosis not present

## 2023-03-13 DIAGNOSIS — J449 Chronic obstructive pulmonary disease, unspecified: Secondary | ICD-10-CM | POA: Diagnosis not present

## 2023-03-13 DIAGNOSIS — Z9181 History of falling: Secondary | ICD-10-CM | POA: Diagnosis not present

## 2023-03-13 DIAGNOSIS — Z556 Problems related to health literacy: Secondary | ICD-10-CM | POA: Diagnosis not present

## 2023-03-13 DIAGNOSIS — N183 Chronic kidney disease, stage 3 unspecified: Secondary | ICD-10-CM | POA: Diagnosis not present

## 2023-03-13 DIAGNOSIS — I129 Hypertensive chronic kidney disease with stage 1 through stage 4 chronic kidney disease, or unspecified chronic kidney disease: Secondary | ICD-10-CM | POA: Diagnosis not present

## 2023-03-13 DIAGNOSIS — E1165 Type 2 diabetes mellitus with hyperglycemia: Secondary | ICD-10-CM | POA: Diagnosis not present

## 2023-03-18 DIAGNOSIS — M810 Age-related osteoporosis without current pathological fracture: Secondary | ICD-10-CM | POA: Diagnosis not present

## 2023-03-18 DIAGNOSIS — J9611 Chronic respiratory failure with hypoxia: Secondary | ICD-10-CM | POA: Diagnosis not present

## 2023-03-18 DIAGNOSIS — I700 Atherosclerosis of aorta: Secondary | ICD-10-CM | POA: Diagnosis not present

## 2023-03-22 DIAGNOSIS — E1122 Type 2 diabetes mellitus with diabetic chronic kidney disease: Secondary | ICD-10-CM | POA: Diagnosis not present

## 2023-03-22 DIAGNOSIS — J449 Chronic obstructive pulmonary disease, unspecified: Secondary | ICD-10-CM | POA: Diagnosis not present

## 2023-03-22 DIAGNOSIS — L89159 Pressure ulcer of sacral region, unspecified stage: Secondary | ICD-10-CM | POA: Diagnosis not present

## 2023-03-22 DIAGNOSIS — G894 Chronic pain syndrome: Secondary | ICD-10-CM | POA: Diagnosis not present

## 2023-04-01 DIAGNOSIS — J449 Chronic obstructive pulmonary disease, unspecified: Secondary | ICD-10-CM | POA: Diagnosis not present

## 2023-04-01 DIAGNOSIS — J9601 Acute respiratory failure with hypoxia: Secondary | ICD-10-CM | POA: Diagnosis not present

## 2023-04-01 DIAGNOSIS — U071 COVID-19: Secondary | ICD-10-CM | POA: Diagnosis not present

## 2023-04-04 DIAGNOSIS — R5381 Other malaise: Secondary | ICD-10-CM | POA: Diagnosis not present

## 2023-04-04 DIAGNOSIS — G319 Degenerative disease of nervous system, unspecified: Secondary | ICD-10-CM | POA: Diagnosis not present

## 2023-04-12 DIAGNOSIS — G894 Chronic pain syndrome: Secondary | ICD-10-CM | POA: Diagnosis not present

## 2023-04-12 DIAGNOSIS — E1165 Type 2 diabetes mellitus with hyperglycemia: Secondary | ICD-10-CM | POA: Diagnosis not present

## 2023-04-12 DIAGNOSIS — G319 Degenerative disease of nervous system, unspecified: Secondary | ICD-10-CM | POA: Diagnosis not present

## 2023-04-12 DIAGNOSIS — N390 Urinary tract infection, site not specified: Secondary | ICD-10-CM | POA: Diagnosis not present

## 2023-04-12 DIAGNOSIS — E1122 Type 2 diabetes mellitus with diabetic chronic kidney disease: Secondary | ICD-10-CM | POA: Diagnosis not present

## 2023-04-12 DIAGNOSIS — R5381 Other malaise: Secondary | ICD-10-CM | POA: Diagnosis not present

## 2023-04-12 DIAGNOSIS — I129 Hypertensive chronic kidney disease with stage 1 through stage 4 chronic kidney disease, or unspecified chronic kidney disease: Secondary | ICD-10-CM | POA: Diagnosis not present

## 2023-04-12 DIAGNOSIS — Z23 Encounter for immunization: Secondary | ICD-10-CM | POA: Diagnosis not present

## 2023-04-12 DIAGNOSIS — J9611 Chronic respiratory failure with hypoxia: Secondary | ICD-10-CM | POA: Diagnosis not present

## 2023-04-12 DIAGNOSIS — M159 Polyosteoarthritis, unspecified: Secondary | ICD-10-CM | POA: Diagnosis not present

## 2023-04-12 DIAGNOSIS — L89159 Pressure ulcer of sacral region, unspecified stage: Secondary | ICD-10-CM | POA: Diagnosis not present

## 2023-04-17 ENCOUNTER — Other Ambulatory Visit: Payer: Self-pay | Admitting: Nurse Practitioner

## 2023-04-17 DIAGNOSIS — J9611 Chronic respiratory failure with hypoxia: Secondary | ICD-10-CM | POA: Diagnosis not present

## 2023-04-17 DIAGNOSIS — E1165 Type 2 diabetes mellitus with hyperglycemia: Secondary | ICD-10-CM | POA: Diagnosis not present

## 2023-04-17 DIAGNOSIS — J449 Chronic obstructive pulmonary disease, unspecified: Secondary | ICD-10-CM | POA: Diagnosis not present

## 2023-04-21 ENCOUNTER — Ambulatory Visit: Payer: 59 | Admitting: Podiatry

## 2023-04-30 ENCOUNTER — Emergency Department (HOSPITAL_COMMUNITY): Payer: 59

## 2023-04-30 ENCOUNTER — Emergency Department (HOSPITAL_COMMUNITY)
Admission: EM | Admit: 2023-04-30 | Discharge: 2023-04-30 | Disposition: A | Discharge status: Home / Self Care | Payer: 59 | Attending: Student | Admitting: Student

## 2023-04-30 ENCOUNTER — Encounter (HOSPITAL_COMMUNITY): Payer: Self-pay | Admitting: *Deleted

## 2023-04-30 ENCOUNTER — Other Ambulatory Visit: Payer: Self-pay

## 2023-04-30 DIAGNOSIS — N1832 Chronic kidney disease, stage 3b: Secondary | ICD-10-CM | POA: Diagnosis not present

## 2023-04-30 DIAGNOSIS — K8020 Calculus of gallbladder without cholecystitis without obstruction: Secondary | ICD-10-CM | POA: Insufficient documentation

## 2023-04-30 DIAGNOSIS — R0789 Other chest pain: Secondary | ICD-10-CM | POA: Insufficient documentation

## 2023-04-30 DIAGNOSIS — K5900 Constipation, unspecified: Secondary | ICD-10-CM | POA: Insufficient documentation

## 2023-04-30 DIAGNOSIS — E119 Type 2 diabetes mellitus without complications: Secondary | ICD-10-CM | POA: Insufficient documentation

## 2023-04-30 DIAGNOSIS — R296 Repeated falls: Secondary | ICD-10-CM | POA: Insufficient documentation

## 2023-04-30 DIAGNOSIS — S0990XA Unspecified injury of head, initial encounter: Secondary | ICD-10-CM | POA: Diagnosis not present

## 2023-04-30 DIAGNOSIS — S3993XA Unspecified injury of pelvis, initial encounter: Secondary | ICD-10-CM | POA: Diagnosis not present

## 2023-04-30 DIAGNOSIS — S299XXA Unspecified injury of thorax, initial encounter: Secondary | ICD-10-CM | POA: Diagnosis not present

## 2023-04-30 DIAGNOSIS — Z8616 Personal history of COVID-19: Secondary | ICD-10-CM | POA: Insufficient documentation

## 2023-04-30 DIAGNOSIS — Z96651 Presence of right artificial knee joint: Secondary | ICD-10-CM | POA: Insufficient documentation

## 2023-04-30 DIAGNOSIS — Z79899 Other long term (current) drug therapy: Secondary | ICD-10-CM | POA: Insufficient documentation

## 2023-04-30 DIAGNOSIS — W01198A Fall on same level from slipping, tripping and stumbling with subsequent striking against other object, initial encounter: Secondary | ICD-10-CM | POA: Insufficient documentation

## 2023-04-30 DIAGNOSIS — S199XXA Unspecified injury of neck, initial encounter: Secondary | ICD-10-CM | POA: Diagnosis not present

## 2023-04-30 DIAGNOSIS — Y92000 Kitchen of unspecified non-institutional (private) residence as  the place of occurrence of the external cause: Secondary | ICD-10-CM | POA: Diagnosis not present

## 2023-04-30 DIAGNOSIS — E1122 Type 2 diabetes mellitus with diabetic chronic kidney disease: Secondary | ICD-10-CM | POA: Diagnosis not present

## 2023-04-30 DIAGNOSIS — F17210 Nicotine dependence, cigarettes, uncomplicated: Secondary | ICD-10-CM | POA: Insufficient documentation

## 2023-04-30 DIAGNOSIS — J449 Chronic obstructive pulmonary disease, unspecified: Secondary | ICD-10-CM | POA: Diagnosis not present

## 2023-04-30 DIAGNOSIS — R079 Chest pain, unspecified: Secondary | ICD-10-CM | POA: Diagnosis not present

## 2023-04-30 DIAGNOSIS — W19XXXA Unspecified fall, initial encounter: Secondary | ICD-10-CM | POA: Diagnosis not present

## 2023-04-30 DIAGNOSIS — I129 Hypertensive chronic kidney disease with stage 1 through stage 4 chronic kidney disease, or unspecified chronic kidney disease: Secondary | ICD-10-CM | POA: Insufficient documentation

## 2023-04-30 DIAGNOSIS — E039 Hypothyroidism, unspecified: Secondary | ICD-10-CM | POA: Diagnosis not present

## 2023-04-30 DIAGNOSIS — R109 Unspecified abdominal pain: Secondary | ICD-10-CM | POA: Diagnosis not present

## 2023-04-30 DIAGNOSIS — R140 Abdominal distension (gaseous): Secondary | ICD-10-CM | POA: Insufficient documentation

## 2023-04-30 DIAGNOSIS — S3991XA Unspecified injury of abdomen, initial encounter: Secondary | ICD-10-CM | POA: Diagnosis not present

## 2023-04-30 DIAGNOSIS — Z7982 Long term (current) use of aspirin: Secondary | ICD-10-CM | POA: Diagnosis not present

## 2023-04-30 DIAGNOSIS — M542 Cervicalgia: Secondary | ICD-10-CM | POA: Diagnosis not present

## 2023-04-30 DIAGNOSIS — Z7983 Long term (current) use of bisphosphonates: Secondary | ICD-10-CM | POA: Diagnosis not present

## 2023-04-30 DIAGNOSIS — R9089 Other abnormal findings on diagnostic imaging of central nervous system: Secondary | ICD-10-CM | POA: Diagnosis not present

## 2023-04-30 DIAGNOSIS — R0902 Hypoxemia: Secondary | ICD-10-CM | POA: Diagnosis not present

## 2023-04-30 DIAGNOSIS — Z743 Need for continuous supervision: Secondary | ICD-10-CM | POA: Diagnosis not present

## 2023-04-30 DIAGNOSIS — S12000A Unspecified displaced fracture of first cervical vertebra, initial encounter for closed fracture: Secondary | ICD-10-CM | POA: Diagnosis not present

## 2023-04-30 LAB — COMPREHENSIVE METABOLIC PANEL
ALT: 22 U/L (ref 0–44)
AST: 29 U/L (ref 15–41)
Albumin: 3.6 g/dL (ref 3.5–5.0)
Alkaline Phosphatase: 69 U/L (ref 38–126)
Anion gap: 9 (ref 5–15)
BUN: 21 mg/dL (ref 8–23)
CO2: 24 mmol/L (ref 22–32)
Calcium: 9.1 mg/dL (ref 8.9–10.3)
Chloride: 104 mmol/L (ref 98–111)
Creatinine, Ser: 1.52 mg/dL — ABNORMAL HIGH (ref 0.44–1.00)
GFR, Estimated: 35 mL/min — ABNORMAL LOW (ref >60)
Glucose, Bld: 124 mg/dL — ABNORMAL HIGH (ref 70–99)
Potassium: 4.1 mmol/L (ref 3.5–5.1)
Sodium: 137 mmol/L (ref 135–145)
Total Bilirubin: 0.6 mg/dL (ref 0.3–1.2)
Total Protein: 6.8 g/dL (ref 6.5–8.1)

## 2023-04-30 LAB — CBC WITH DIFFERENTIAL/PLATELET
Abs Immature Granulocytes: 0.08 10*3/uL — ABNORMAL HIGH (ref 0.00–0.07)
Basophils Absolute: 0.1 10*3/uL (ref 0.0–0.1)
Basophils Relative: 0 %
Eosinophils Absolute: 0 10*3/uL (ref 0.0–0.5)
Eosinophils Relative: 0 %
HCT: 39.3 % (ref 36.0–46.0)
Hemoglobin: 12.9 g/dL (ref 12.0–15.0)
Immature Granulocytes: 1 %
Lymphocytes Relative: 9 %
Lymphs Abs: 1.2 10*3/uL (ref 0.7–4.0)
MCH: 30.2 pg (ref 26.0–34.0)
MCHC: 32.8 g/dL (ref 30.0–36.0)
MCV: 92 fL (ref 80.0–100.0)
Monocytes Absolute: 0.7 10*3/uL (ref 0.1–1.0)
Monocytes Relative: 5 %
Neutro Abs: 11.5 10*3/uL — ABNORMAL HIGH (ref 1.7–7.7)
Neutrophils Relative %: 85 %
Platelets: 201 10*3/uL (ref 150–400)
RBC: 4.27 MIL/uL (ref 3.87–5.11)
RDW: 15.5 % (ref 11.5–15.5)
WBC: 13.6 10*3/uL — ABNORMAL HIGH (ref 4.0–10.5)
nRBC: 0 % (ref 0.0–0.2)

## 2023-04-30 MED ORDER — POLYETHYLENE GLYCOL 3350 17 G PO PACK: 17.0000 g | PACK | Freq: Every day | ORAL | 0 refills | Status: DC

## 2023-04-30 MED ORDER — GLYCERIN (LAXATIVE) 1 G RE SUPP
1.0000 | RECTAL | Status: DC | PRN
  Administered 2023-04-30: 1 g via RECTAL
  Filled 2023-04-30: qty 1

## 2023-04-30 MED ORDER — MAGNESIUM CITRATE PO SOLN
1.0000 | Freq: Once | ORAL | 0 refills | Status: AC
Start: 2023-04-30 — End: 2023-04-30

## 2023-04-30 MED ORDER — IOHEXOL 300 MG/ML  SOLN
80.0000 mL | Freq: Once | INTRAMUSCULAR | Status: AC | PRN
  Administered 2023-04-30: 80 mL via INTRAVENOUS

## 2023-04-30 MED ORDER — HYDROCODONE-ACETAMINOPHEN 5-325 MG PO TABS
1.0000 | ORAL_TABLET | Freq: Once | ORAL | Status: AC
  Administered 2023-04-30: 1 via ORAL
  Filled 2023-04-30: qty 1

## 2023-04-30 MED ORDER — LACTULOSE 10 GM/15ML PO SOLN
30.0000 g | Freq: Once | ORAL | Status: AC
  Administered 2023-04-30: 30 g via ORAL
  Filled 2023-04-30: qty 60

## 2023-04-30 NOTE — ED Notes (Signed)
Pt with small lac to back of head, abrasion to upper back.

## 2023-04-30 NOTE — ED Triage Notes (Signed)
Pt BIB [facility] after slipping and falling around 0930 this morning, hitting her head on the sink.  Denies LOC. Pt denies blood thinners. Pt c/o pain all over since fall but EMS reported more to abd and legs.  Has walker at home but denies using at time of fall today. EMS reports CBG 149, 122/62, HR 64, RA sat 91%, pt placed on 2 L/M  [place] and sats up to 93-94% per report

## 2023-04-30 NOTE — ED Provider Notes (Signed)
[place] EMERGENCY DEPARTMENT AT [facility] Provider Note  CSN: [id number] Arrival date & time: 04/30/23 1226  Chief Complaint(s) Fall  HPI Amanda Armstrong is a 77 y.o. female with PMH COPD on 3 L chronic nasal cannula, tobacco use, T2DM, hypothyroidism who presents emergency room for evaluation of a fall.  Patient states that she was in the kitchen when she suffered a mechanical fall.    Past Medical History 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    pt stated that she has intermittent sx   Clostridium difficile infection    Daytime somnolence    Frequent falls    Headache(784.0)    SINUS HEADACHE OCCASSIONALLY   Hypertension    Hypothyroidism    Multifactorial gait disorder    Osteoporosis    Right foot drop    Snoring    Stroke (HCC)    Tobacco use    Vertigo    Weakness    Patient Active Problem List   Diagnosis Date Noted   AKI (acute kidney injury) (HCC) 07/04/2022   Type 2 diabetes mellitus (HCC) 07/04/2022   COPD (chronic obstructive pulmonary disease) (HCC) 07/04/2022   Chronic hypoxic respiratory failure, on home oxygen therapy (HCC) 07/04/2022   Tobacco use 07/04/2022   Acute cystitis 07/04/2022   Acute respiratory failure with hypoxia (HCC) 07/03/2022   COPD GOLD ? / group B still smoking  05/21/2021   Chronic respiratory failure with hypoxia (HCC) 05/21/2021   Uncontrolled type 2 diabetes mellitus with hyperglycemia (HCC) 03/23/2021   Insomnia 03/23/2021   Chronic pain syndrome 03/22/2021   Recurrent falls 03/22/2021   Vertigo 03/22/2021   Multifactorial gait disorder 03/22/2021   Right foot drop 03/22/2021   Hyperosmolar hyperglycemic state (HHS) (HCC) 03/22/2021   Unspecified protein-calorie malnutrition (HCC) 08/07/2020   Altered mental status    Cigarette smoker 08/04/2020   Acute metabolic  encephalopathy 08/04/2020   Acute renal failure superimposed on stage 3b chronic kidney disease (HCC) 08/04/2020   COVID-19 08/03/2020   UTI (urinary tract infection) 01/29/2016   Nicotine dependence 09/07/2015   Enteritis due to Clostridium difficile 08/29/2013   Weakness 08/02/2013   Diarrhea 08/02/2013   Acute pyelonephritis 05/28/2013   Lower urinary tract infectious disease 05/27/2013   Tachypnea 05/27/2013   Hyperglycemia 05/27/2013   Arthritis    Anxiety    Hypothyroidism    Home Medication(s) Prior to Admission medications   Medication Sig Start Date End Date Taking? Authorizing Provider  ACCU-CHEK GUIDE test strip USE TEST STRIP TO CHECK GLUCOSE TWICE DAILY BEFORE BREAKFAST AND  BEFORE BEDTIME 02/27/23   Dani Gobble, NP  Accu-Chek Softclix Lancets lancets USE  TO CHECK GLUCOSE TWICE DAILY 04/18/23   Dani Gobble, NP  acetaminophen (TYLENOL) 500 MG tablet Take 1,000 mg by mouth every 6 (six) hours as needed for mild pain.    [provider]  alendronate (FOSAMAX) 70 MG tablet Take 70 mg by mouth once a week. 03/18/18   [provider]  ALPRAZolam Prudy Feeler) 1 MG tablet Take 1 mg by mouth at bedtime. 04/22/18   [provider]  Ernestina Patches 62.5-25 MCG/ACT AEPB Inhale 1 puff into the lungs daily. 06/24/21   [provider]  aspirin 81 MG EC tablet Take 81 mg by mouth in the morning. 03/01/18  [provider]  atorvastatin (LIPITOR) 20 MG tablet Take 20 mg by mouth daily at 12 noon. 02/11/18   [provider]  blood glucose meter kit and supplies Dispense based on patient and insurance preference. Use up to four times daily as directed. (FOR ICD-10 E10.9, E11.9). 03/24/21   Amin, Ankit C, MD  Cholecalciferol (VITAMIN D3) 50 MCG (2000 UT) capsule Take 2,000 Units by mouth daily.    [provider]  donepezil (ARICEPT) 5 MG tablet Take 5 mg by mouth at bedtime. 02/20/23   [provider]  furosemide (LASIX) 20  MG tablet Take 20 mg by mouth daily.    [provider]  HYDROcodone-acetaminophen (NORCO) 10-325 MG tablet Take 1 tablet by mouth every 4 (four) hours as needed for moderate pain. 12/02/20   [provider]  Insulin Pen Needle (PEN NEEDLES 3/16") 31G X 5 MM MISC 100 each by Does not apply route 2 (two) times daily. 03/24/21   Miguel Rota, MD  levothyroxine (SYNTHROID) 75 MCG tablet Take 1 tablet (75 mcg total) by mouth daily before breakfast. 02/27/23   Dani Gobble, NP  OXYGEN Inhale 1 L/min into the lungs daily.    [provider]  RELION PEN NEEDLES 31G X 6 MM MISC Use to inject insulin once daily. 07/27/21   Dani Gobble, NP                                                                                                                                    Past Surgical History Past Surgical History:  Procedure Laterality Date   CATARACT EXTRACTION W/PHACO Right 06/28/2016   Procedure: CATARACT EXTRACTION PHACO AND INTRAOCULAR LENS PLACEMENT (IOC);  Surgeon: Jethro Bolus, MD;  Location: [facility];  Service: Ophthalmology;  Laterality: Right;  CDE: 11.10   CATARACT EXTRACTION W/PHACO Left 07/26/2016   Procedure: CATARACT EXTRACTION PHACO AND INTRAOCULAR LENS PLACEMENT (IOC);  Surgeon: Jethro Bolus, MD;  Location: [facility];  Service: Ophthalmology;  Laterality: Left;  CDE: 8.88   JOINT REPLACEMENT  11/08/3010   right total knee   TOTAL KNEE ARTHROPLASTY  11/07/2011   Procedure: TOTAL KNEE ARTHROPLASTY;  Surgeon: Nilda Simmer, MD;  Location: [facility];  Service: Orthopedics;  Laterality: Left;  DR Anne Ng 90 MINUTES FOR THIS CASE   TOTAL KNEE ARTHROPLASTY Right 2011   TUBAL LIGATION  1982   Family History Family History  Problem Relation Age of Onset   Heart attack Mother    COPD Father    Arthritis Sister    Heart disease Brother     Social History Social History   Tobacco Use   Smoking status: Every Day    Current packs/day: 1.00    Average  packs/day: 1 pack/day for 48.0 years (48.0 ttl pk-yrs)    Types: Cigarettes   Smokeless tobacco: Never   Tobacco comments:    12/29/14 1/2 PPD  Vaping Use   Vaping status: Never Used  Substance Use Topics   Alcohol use: No    Alcohol/week: 0.0 standard drinks of alcohol   Drug use: No   Allergies Iron, Oxycodone, and Sudafed [pseudoephedrine hcl]  Review of Systems Review of Systems *** Physical Exam Vital Signs  I have reviewed the triage vital signs BP (!) 124/58   Pulse (!) 55   Temp 97.9 F (36.6 C) (Oral)   Ht 4\' 10"  (1.473 m)   Wt 73.9 kg   SpO2 91%   BMI 34.05 kg/m  *** Physical Exam  ED Results and Treatments Labs (all labs ordered are listed, but only abnormal results are displayed) Labs Reviewed  COMPREHENSIVE METABOLIC PANEL  CBC WITH DIFFERENTIAL/PLATELET                                                                                                                          Radiology No results found.  Pertinent labs & imaging results that were available during my care of the patient were reviewed by me and considered in my medical decision making (see MDM for details).  Medications Ordered in ED Medications - No data to display                                                                                                                                   Procedures Procedures  (including critical care time)  Medical Decision Making / ED Course   This patient presents to the ED for concern of ***, this involves an extensive number of treatment options, and is a complaint that carries with it a high risk of complications and morbidity.  The differential diagnosis includes ***  MDM: ***   Additional history obtained: -Additional history obtained from *** -External records from outside source obtained and reviewed including: Chart review including previous notes, labs, imaging, consultation notes   Lab Tests: -I ordered, reviewed, and  interpreted labs.   The pertinent results include:   Labs Reviewed  COMPREHENSIVE METABOLIC PANEL  CBC WITH DIFFERENTIAL/PLATELET      EKG ***  EKG Interpretation Date/Time:    Ventricular Rate:    PR Interval:    QRS Duration:    QT Interval:    QTC Calculation:   R Axis:      Text Interpretation:           Imaging Studies ordered: I  ordered imaging studies including *** I independently visualized and interpreted imaging. I agree with the radiologist interpretation   Medicines ordered and prescription drug management: No orders of the defined types were placed in this encounter.   -I have reviewed the patients home medicines and have made adjustments as needed  Critical interventions ***  Consultations Obtained: I requested consultation with the ***,  and discussed lab and imaging findings as well as pertinent plan - they recommend: ***   Cardiac Monitoring: The patient was maintained on a cardiac monitor.  I personally viewed and interpreted the cardiac monitored which showed an underlying rhythm of: ***  Social Determinants of Health:  Factors impacting patients care include: ***   Reevaluation: After the interventions noted above, I reevaluated the patient and found that they have :{resolved/improved/worsened:23923::"improved"}  Co morbidities that complicate the patient evaluation  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    pt stated that she has intermittent sx   Clostridium difficile infection    Daytime somnolence    Frequent falls    Headache(784.0)    SINUS HEADACHE OCCASSIONALLY   Hypertension    Hypothyroidism    Multifactorial gait disorder    Osteoporosis    Right foot drop    Snoring    Stroke (HCC)    Tobacco use    Vertigo    Weakness       Dispostion: I considered admission for this patient,  ***     Final Clinical Impression(s) / ED Diagnoses Final diagnoses:  None     @PCDICTATION @

## 2023-05-01 DIAGNOSIS — J9601 Acute respiratory failure with hypoxia: Secondary | ICD-10-CM | POA: Diagnosis not present

## 2023-05-01 DIAGNOSIS — J449 Chronic obstructive pulmonary disease, unspecified: Secondary | ICD-10-CM | POA: Diagnosis not present

## 2023-05-01 DIAGNOSIS — U071 COVID-19: Secondary | ICD-10-CM | POA: Diagnosis not present

## 2023-05-02 DIAGNOSIS — G319 Degenerative disease of nervous system, unspecified: Secondary | ICD-10-CM | POA: Diagnosis not present

## 2023-05-02 DIAGNOSIS — J449 Chronic obstructive pulmonary disease, unspecified: Secondary | ICD-10-CM | POA: Diagnosis not present

## 2023-05-02 DIAGNOSIS — M159 Polyosteoarthritis, unspecified: Secondary | ICD-10-CM | POA: Diagnosis not present

## 2023-05-02 DIAGNOSIS — J9611 Chronic respiratory failure with hypoxia: Secondary | ICD-10-CM | POA: Diagnosis not present

## 2023-05-02 DIAGNOSIS — L89159 Pressure ulcer of sacral region, unspecified stage: Secondary | ICD-10-CM | POA: Diagnosis not present

## 2023-05-02 DIAGNOSIS — G894 Chronic pain syndrome: Secondary | ICD-10-CM | POA: Diagnosis not present

## 2023-05-02 DIAGNOSIS — R5381 Other malaise: Secondary | ICD-10-CM | POA: Diagnosis not present

## 2023-05-02 DIAGNOSIS — I129 Hypertensive chronic kidney disease with stage 1 through stage 4 chronic kidney disease, or unspecified chronic kidney disease: Secondary | ICD-10-CM | POA: Diagnosis not present

## 2023-05-02 DIAGNOSIS — E1122 Type 2 diabetes mellitus with diabetic chronic kidney disease: Secondary | ICD-10-CM | POA: Diagnosis not present

## 2023-05-02 DIAGNOSIS — E1165 Type 2 diabetes mellitus with hyperglycemia: Secondary | ICD-10-CM | POA: Diagnosis not present

## 2023-05-04 DIAGNOSIS — G319 Degenerative disease of nervous system, unspecified: Secondary | ICD-10-CM | POA: Diagnosis not present

## 2023-05-04 DIAGNOSIS — R5381 Other malaise: Secondary | ICD-10-CM | POA: Diagnosis not present

## 2023-05-06 ENCOUNTER — Emergency Department (HOSPITAL_COMMUNITY): Payer: 59

## 2023-05-06 ENCOUNTER — Encounter (HOSPITAL_COMMUNITY): Payer: Self-pay | Admitting: Emergency Medicine

## 2023-05-06 ENCOUNTER — Emergency Department (HOSPITAL_COMMUNITY)
Admission: EM | Admit: 2023-05-06 | Discharge: 2023-05-06 | Disposition: A | Discharge status: Home / Self Care | Payer: 59 | Attending: Emergency Medicine | Admitting: Emergency Medicine

## 2023-05-06 ENCOUNTER — Other Ambulatory Visit: Payer: Self-pay

## 2023-05-06 DIAGNOSIS — I6782 Cerebral ischemia: Secondary | ICD-10-CM | POA: Diagnosis not present

## 2023-05-06 DIAGNOSIS — K5903 Drug induced constipation: Secondary | ICD-10-CM | POA: Diagnosis not present

## 2023-05-06 DIAGNOSIS — T40605A Adverse effect of unspecified narcotics, initial encounter: Secondary | ICD-10-CM | POA: Insufficient documentation

## 2023-05-06 DIAGNOSIS — M4804 Spinal stenosis, thoracic region: Secondary | ICD-10-CM | POA: Diagnosis not present

## 2023-05-06 DIAGNOSIS — K828 Other specified diseases of gallbladder: Secondary | ICD-10-CM | POA: Diagnosis not present

## 2023-05-06 DIAGNOSIS — R531 Weakness: Secondary | ICD-10-CM | POA: Insufficient documentation

## 2023-05-06 DIAGNOSIS — M546 Pain in thoracic spine: Secondary | ICD-10-CM | POA: Diagnosis not present

## 2023-05-06 DIAGNOSIS — E039 Hypothyroidism, unspecified: Secondary | ICD-10-CM | POA: Diagnosis not present

## 2023-05-06 DIAGNOSIS — Q524 Other congenital malformations of vagina: Secondary | ICD-10-CM | POA: Diagnosis not present

## 2023-05-06 DIAGNOSIS — Z79899 Other long term (current) drug therapy: Secondary | ICD-10-CM | POA: Insufficient documentation

## 2023-05-06 DIAGNOSIS — R1011 Right upper quadrant pain: Secondary | ICD-10-CM | POA: Diagnosis present

## 2023-05-06 DIAGNOSIS — M4854XA Collapsed vertebra, not elsewhere classified, thoracic region, initial encounter for fracture: Secondary | ICD-10-CM | POA: Diagnosis not present

## 2023-05-06 DIAGNOSIS — R918 Other nonspecific abnormal finding of lung field: Secondary | ICD-10-CM | POA: Diagnosis not present

## 2023-05-06 DIAGNOSIS — Z743 Need for continuous supervision: Secondary | ICD-10-CM | POA: Diagnosis not present

## 2023-05-06 DIAGNOSIS — R1031 Right lower quadrant pain: Secondary | ICD-10-CM | POA: Diagnosis not present

## 2023-05-06 DIAGNOSIS — K8020 Calculus of gallbladder without cholecystitis without obstruction: Secondary | ICD-10-CM | POA: Diagnosis not present

## 2023-05-06 DIAGNOSIS — R109 Unspecified abdominal pain: Secondary | ICD-10-CM | POA: Diagnosis not present

## 2023-05-06 DIAGNOSIS — G8929 Other chronic pain: Secondary | ICD-10-CM | POA: Insufficient documentation

## 2023-05-06 DIAGNOSIS — E119 Type 2 diabetes mellitus without complications: Secondary | ICD-10-CM | POA: Diagnosis not present

## 2023-05-06 DIAGNOSIS — R52 Pain, unspecified: Secondary | ICD-10-CM

## 2023-05-06 DIAGNOSIS — R079 Chest pain, unspecified: Secondary | ICD-10-CM | POA: Diagnosis not present

## 2023-05-06 DIAGNOSIS — N398 Other specified disorders of urinary system: Secondary | ICD-10-CM

## 2023-05-06 DIAGNOSIS — R0989 Other specified symptoms and signs involving the circulatory and respiratory systems: Secondary | ICD-10-CM | POA: Diagnosis not present

## 2023-05-06 DIAGNOSIS — I10 Essential (primary) hypertension: Secondary | ICD-10-CM | POA: Diagnosis not present

## 2023-05-06 DIAGNOSIS — K7689 Other specified diseases of liver: Secondary | ICD-10-CM | POA: Diagnosis not present

## 2023-05-06 DIAGNOSIS — K5900 Constipation, unspecified: Secondary | ICD-10-CM | POA: Diagnosis not present

## 2023-05-06 DIAGNOSIS — K5730 Diverticulosis of large intestine without perforation or abscess without bleeding: Secondary | ICD-10-CM | POA: Diagnosis not present

## 2023-05-06 DIAGNOSIS — Z794 Long term (current) use of insulin: Secondary | ICD-10-CM | POA: Insufficient documentation

## 2023-05-06 DIAGNOSIS — N329 Bladder disorder, unspecified: Secondary | ICD-10-CM | POA: Insufficient documentation

## 2023-05-06 DIAGNOSIS — M419 Scoliosis, unspecified: Secondary | ICD-10-CM | POA: Diagnosis not present

## 2023-05-06 DIAGNOSIS — G9389 Other specified disorders of brain: Secondary | ICD-10-CM | POA: Diagnosis not present

## 2023-05-06 DIAGNOSIS — Z7982 Long term (current) use of aspirin: Secondary | ICD-10-CM | POA: Diagnosis not present

## 2023-05-06 DIAGNOSIS — R519 Headache, unspecified: Secondary | ICD-10-CM | POA: Diagnosis not present

## 2023-05-06 DIAGNOSIS — M79606 Pain in leg, unspecified: Secondary | ICD-10-CM | POA: Diagnosis not present

## 2023-05-06 LAB — COMPREHENSIVE METABOLIC PANEL
ALT: 18 U/L (ref 0–44)
AST: 22 U/L (ref 15–41)
Albumin: 3.5 g/dL (ref 3.5–5.0)
Alkaline Phosphatase: 60 U/L (ref 38–126)
Anion gap: 10 (ref 5–15)
BUN: 18 mg/dL (ref 8–23)
CO2: 24 mmol/L (ref 22–32)
Calcium: 8.8 mg/dL — ABNORMAL LOW (ref 8.9–10.3)
Chloride: 103 mmol/L (ref 98–111)
Creatinine, Ser: 1.63 mg/dL — ABNORMAL HIGH (ref 0.44–1.00)
GFR, Estimated: 32 mL/min — ABNORMAL LOW (ref >60)
Glucose, Bld: 89 mg/dL (ref 70–99)
Potassium: 4 mmol/L (ref 3.5–5.1)
Sodium: 137 mmol/L (ref 135–145)
Total Bilirubin: 0.4 mg/dL (ref 0.3–1.2)
Total Protein: 6.7 g/dL (ref 6.5–8.1)

## 2023-05-06 LAB — URINALYSIS, ROUTINE W REFLEX MICROSCOPIC
Bilirubin Urine: NEGATIVE
Glucose, UA: NEGATIVE mg/dL
Hgb urine dipstick: NEGATIVE
Ketones, ur: NEGATIVE mg/dL
Nitrite: NEGATIVE
Protein, ur: 30 mg/dL — AB
Specific Gravity, Urine: >1.046 — ABNORMAL HIGH (ref 1.005–1.030)
WBC, UA: >50 WBC/hpf (ref 0–5)
pH: 6 (ref 5.0–8.0)

## 2023-05-06 LAB — MAGNESIUM: Magnesium: 2.5 mg/dL — ABNORMAL HIGH (ref 1.7–2.4)

## 2023-05-06 LAB — CBC WITH DIFFERENTIAL/PLATELET
Abs Immature Granulocytes: 0.03 10*3/uL (ref 0.00–0.07)
Basophils Absolute: 0 10*3/uL (ref 0.0–0.1)
Basophils Relative: 1 %
Eosinophils Absolute: 0.2 10*3/uL (ref 0.0–0.5)
Eosinophils Relative: 3 %
HCT: 40.5 % (ref 36.0–46.0)
Hemoglobin: 13 g/dL (ref 12.0–15.0)
Immature Granulocytes: 1 %
Lymphocytes Relative: 24 %
Lymphs Abs: 1.5 10*3/uL (ref 0.7–4.0)
MCH: 29.6 pg (ref 26.0–34.0)
MCHC: 32.1 g/dL (ref 30.0–36.0)
MCV: 92.3 fL (ref 80.0–100.0)
Monocytes Absolute: 0.4 10*3/uL (ref 0.1–1.0)
Monocytes Relative: 7 %
Neutro Abs: 4.1 10*3/uL (ref 1.7–7.7)
Neutrophils Relative %: 64 %
Platelets: 193 10*3/uL (ref 150–400)
RBC: 4.39 MIL/uL (ref 3.87–5.11)
RDW: 15.4 % (ref 11.5–15.5)
WBC: 6.3 10*3/uL (ref 4.0–10.5)
nRBC: 0 % (ref 0.0–0.2)

## 2023-05-06 LAB — LACTIC ACID, PLASMA: Lactic Acid, Venous: 1.4 mmol/L (ref 0.5–1.9)

## 2023-05-06 LAB — AMMONIA: Ammonia: 13 umol/L (ref 9–35)

## 2023-05-06 LAB — LIPASE, BLOOD: Lipase: 44 U/L (ref 11–51)

## 2023-05-06 LAB — CBG MONITORING, ED: Glucose-Capillary: 85 mg/dL (ref 70–99)

## 2023-05-06 LAB — TSH: TSH: 28.167 u[IU]/mL — ABNORMAL HIGH (ref 0.350–4.500)

## 2023-05-06 LAB — TROPONIN I (HIGH SENSITIVITY): Troponin I (High Sensitivity): 4 ng/L (ref <18)

## 2023-05-06 LAB — T4, FREE: Free T4: 0.75 ng/dL (ref 0.61–1.12)

## 2023-05-06 LAB — BRAIN NATRIURETIC PEPTIDE: B Natriuretic Peptide: 69 pg/mL (ref 0.0–100.0)

## 2023-05-06 MED ORDER — HYDROMORPHONE HCL 1 MG/ML IJ SOLN
1.0000 mg | Freq: Once | INTRAMUSCULAR | Status: AC
  Administered 2023-05-06: 1 mg via INTRAVENOUS
  Filled 2023-05-06: qty 1

## 2023-05-06 MED ORDER — METHYLNALTREXONE BROMIDE 12 MG/0.6ML ~~LOC~~ SOLN
12.0000 mg | Freq: Once | SUBCUTANEOUS | Status: DC
Start: 2023-05-06 — End: 2023-05-06

## 2023-05-06 MED ORDER — IOHEXOL 300 MG/ML  SOLN
80.0000 mL | Freq: Once | INTRAMUSCULAR | Status: AC | PRN
  Administered 2023-05-06: 80 mL via INTRAVENOUS

## 2023-05-06 MED ORDER — SORBITOL 70 % SOLN: 960.0000 mL | TOPICAL_OIL | Freq: Once | ORAL | Status: DC

## 2023-05-06 MED ORDER — METHYLNALTREXONE BROMIDE 12 MG/0.6ML ~~LOC~~ SOLN
6.0000 mg | Freq: Once | SUBCUTANEOUS | Status: AC
  Administered 2023-05-06: 6 mg via SUBCUTANEOUS
  Filled 2023-05-06: qty 0.6

## 2023-05-06 NOTE — ED Notes (Signed)
Removed pt liner from pullup  Pt liner drenched in urine  Pt states continent of urine, but leaks at time  Pt states will call out when able to give urine sample

## 2023-05-06 NOTE — ED Provider Notes (Signed)
  Provider Note MRN:  [id number]  Arrival date & time: 05/06/23    ED Course and Medical Decision Making  Assumed care from Dr Clayborne Dana at shift change.  See note from prior team for complete details, in brief:  77 yo female Rectal exam w/o impaction Ongoing constipation Relistor pending CT with ?colovaginal fisutla O/w looks okay  Plan per prior physician recheck   On reassessment she is feeling better, she is still not had a bowel movement.  She was given Relistor as she is on chronic opiates.  Discussed with family at bedside in regards to medications to help relieve constipation at home.  Recommend follow-up with PCP next week.  Stable for discharge home in care of family  The patient improved significantly and was discharged in stable condition. Detailed discussions were had with the patient regarding current findings, and need for close f/u with PCP or on call doctor. The patient has been instructed to return immediately if the symptoms worsen in any way for re-evaluation. Patient verbalized understanding and is in agreement with current care plan. All questions answered prior to discharge.    Procedures  Final Clinical Impressions(s) / ED Diagnoses     ICD-10-CM   1. Constipation, unspecified constipation type  K59.00     2. Weakness  R53.1     3. Diffuse pain  R52     4. Urothelial lesion  N39.8     5. Vaginal anomaly  Q52.4       ED Discharge Orders          Ordered    Ambulatory referral to Obstetrics / Gynecology        05/06/23 0817    Ambulatory referral to Urology        05/06/23 0817              Discharge Instructions      I would strongly consider reducing your use of pain medication as I think it has contributed to or at least made your constipation significantly worse over the last week or so.   Please follow up with your primary care doctor within 2-3 days. For constipation we also recommend a diet high in fiber (beans, fruits, vegetables,  whole grains). Take Colace 100-200 mg up to three times per day. You may take along with Senokot 1-2 tabs, ingest with full glass of water.  You may also take MiraLAX 1-2 capfuls 1-2 times a day until stools become soft and then slowly decrease the amount of MiraLAX used.  Maintain fluid intake 6-8 glasses per day. Please increase fibers in your diet. You may also take Milk of Magnesia 30 mL as needed for constipation, you may repeat in 2 hours again if no bowl movement.         Sloan Leiter, DO 05/06/23 1205

## 2023-05-06 NOTE — ED Triage Notes (Signed)
Pt bib EMS with c/o bilateral leg pain and weakness that started sometime after midnight tonight. Pt states she hasn't been able to be as mobile since. Pt also c/o abdominal pain and states she has not had a BM since Monday. Denies any nausea or vomiting.

## 2023-05-06 NOTE — Discharge Instructions (Addendum)
I would strongly consider reducing your use of pain medication as I think it has contributed to or at least made your constipation significantly worse over the last week or so.   Please follow up with your primary care doctor within 2-3 days. For constipation we also recommend a diet high in fiber (beans, fruits, vegetables, whole grains). Take Colace 100-200 mg up to three times per day. You may take along with Senokot 1-2 tabs, ingest with full glass of water.  You may also take MiraLAX 1-2 capfuls 1-2 times a day until stools become soft and then slowly decrease the amount of MiraLAX used.  Maintain fluid intake 6-8 glasses per day. Please increase fibers in your diet. You may also take Milk of Magnesia 30 mL as needed for constipation, you may repeat in 2 hours again if no bowl movement.

## 2023-05-06 NOTE — ED Provider Notes (Signed)
EMERGENCY DEPARTMENT AT [facility] Provider Note   CSN: [id number] Arrival date & time: 05/06/23  4332     History  Chief Complaint  Patient presents with   Abdominal Pain   Leg Pain   Weakness    Amanda Armstrong is a 77 y.o. female.  77 year old female with a history of hypothyroidism, diabetes, chronic pain, hypertension, chronic opiate use and right foot drop that presents the ER today with multiple symptoms.  Patient states that she has pain "all over".  She states that she has pain in both her legs, back, abdomen, chest.  States she has not had a bowel movement since Monday.  No emesis.  Feels like her abdomen is bloated.  She states that her legs are weak and she has difficulty walking.  She does note this is related the pain or not.  States that her right seems worse than her left.  She is not sure when this seemed to gotten worse.  EMS called.  Brought here for further evaluation.  Vital signs stable with EMS.  Review of the records patient was here for a fall 6 days ago.  I reviewed those records she had lactulose and glycerin in the ER secondary to constipation with large amount of stool in her rectum but no obstruction. The rest of her workup was relatively reassuring. States she has been using the MiraLAX as prescribed and still has had a bowel movement.  Also states she has not passed gas.  However no vomiting or fever.  No nausea.  States she tried her home pain medication without any relief at all.  No respiratory symptoms.  Has some back pain. That doesn't seem to be new. Still a pack a day smoker.    Abdominal Pain Leg Pain Weakness Associated symptoms: abdominal pain        Home Medications Prior to Admission medications   Medication Sig Start Date End Date Taking? Authorizing Provider  ACCU-CHEK GUIDE test strip USE TEST STRIP TO CHECK GLUCOSE TWICE DAILY BEFORE BREAKFAST AND  BEFORE BEDTIME 02/27/23   Dani Gobble, NP  Accu-Chek  Softclix Lancets lancets USE  TO CHECK GLUCOSE TWICE DAILY 04/18/23   Dani Gobble, NP  acetaminophen (TYLENOL) 500 MG tablet Take 1,000 mg by mouth every 6 (six) hours as needed for mild pain.    [provider]  alendronate (FOSAMAX) 70 MG tablet Take 70 mg by mouth once a week. 03/18/18   [provider]  ALPRAZolam Prudy Feeler) 1 MG tablet Take 1 mg by mouth at bedtime. 04/22/18   [provider]  Ernestina Patches 62.5-25 MCG/ACT AEPB Inhale 1 puff into the lungs daily. 06/24/21   [provider]  aspirin 81 MG EC tablet Take 81 mg by mouth in the morning. 03/01/18   [provider]  atorvastatin (LIPITOR) 20 MG tablet Take 20 mg by mouth daily at 12 noon. 02/11/18   [provider]  blood glucose meter kit and supplies Dispense based on patient and insurance preference. Use up to four times daily as directed. (FOR ICD-10 E10.9, E11.9). 03/24/21   Amin, Ankit C, MD  Cholecalciferol (VITAMIN D3) 50 MCG (2000 UT) capsule Take 2,000 Units by mouth daily.    [provider]  donepezil (ARICEPT) 5 MG tablet Take 5 mg by mouth at bedtime. 02/20/23   [provider]  furosemide (LASIX) 20 MG tablet Take 20 mg by mouth daily.    [provider]  HYDROcodone-acetaminophen (NORCO) 10-325 MG tablet Take 1 tablet by mouth every 4 (four) hours as needed for moderate pain. 12/02/20   [provider]  Insulin Pen Needle (PEN NEEDLES 3/16") 31G X 5 MM MISC 100 each by Does not apply route 2 (two) times daily. 03/24/21   Miguel Rota, MD  levothyroxine (SYNTHROID) 75 MCG tablet Take 1 tablet (75 mcg total) by mouth daily before breakfast. 02/27/23   Dani Gobble, NP  OXYGEN Inhale 1 L/min into the lungs daily.    [provider]  polyethylene glycol (MIRALAX) 17 g packet Take 17 g by mouth daily. 04/30/23   Kommor, Madison, MD  RELION PEN NEEDLES 31G X 6 MM MISC Use to inject insulin once daily. 07/27/21   Dani Gobble,  NP      Allergies    Iron, Oxycodone, and Sudafed [pseudoephedrine hcl]    Review of Systems   Review of Systems  Gastrointestinal:  Positive for abdominal pain.  Neurological:  Positive for weakness.    Physical Exam Updated Vital Signs BP (!) 137/54 (BP Location: Left Arm)   Pulse (!) 58   Temp 98.4 F (36.9 C) (Rectal)   Resp 20   Ht 4\' 10"  (1.473 m)   Wt 74 kg   SpO2 92%   BMI 34.10 kg/m  Physical Exam Cardiovascular:     Rate and Rhythm: Normal rate.  Pulmonary:     Effort: Pulmonary effort is normal.  Abdominal:     General: There is distension.     Tenderness: There is generalized abdominal tenderness and tenderness in the right upper quadrant. There is no guarding.     Comments: Abdominal ttp diffusely but seems to be worse in RUQ.  Genitourinary:    Comments: Rectal exam with nurse Angie and patient's daughter in the room.  Very small amount of soft stool that I could barely reach with my finger.  Definitely no impaction. Musculoskeletal:     Thoracic back: Tenderness present.  Skin:    General: Skin is warm and dry.  Neurological:     Comments: Seems to have symmetric and normal dorsiflexion/plantarflexion bilaterally, symmetric and normal sensation to light touch bilaterally.  Cannot elicit patellar reflexes.  No saddle anesthesia.     ED Results / Procedures / Treatments   Labs (all labs ordered are listed, but only abnormal results are displayed) Labs Reviewed  COMPREHENSIVE METABOLIC PANEL - Abnormal; Notable for the following components:      Result Value   Creatinine, Ser 1.63 (*)    Calcium 8.8 (*)    GFR, Estimated 32 (*)    All other components within normal limits  MAGNESIUM - Abnormal; Notable for the following components:   Magnesium 2.5 (*)    All other components within normal limits  TSH - Abnormal; Notable for the following components:   TSH 28.167 (*)    All other components within normal limits  CBC WITH DIFFERENTIAL/PLATELET   AMMONIA  LACTIC ACID, PLASMA  LIPASE, BLOOD  BRAIN NATRIURETIC PEPTIDE  URINALYSIS, ROUTINE W REFLEX MICROSCOPIC  T4, FREE  CBG MONITORING, ED  TROPONIN I (HIGH SENSITIVITY)    EKG EKG Interpretation Date/Time:  Saturday May 06 2023 05:17:37 EDT Ventricular Rate:  52 PR Interval:  215 QRS Duration:  111 QT Interval:  449 QTC Calculation: 418 R Axis:   28  Text Interpretation: Sinus rhythm Borderline prolonged PR interval Low voltage, extremity and precordial leads Confirmed by Marily Memos [phone]) on 05/06/2023  6:09:20 AM  Radiology CT ABDOMEN PELVIS W CONTRAST  Result Date: 05/06/2023 CLINICAL DATA:  77 year old female with history of abdominal pain. Suspected bowel obstruction. EXAM: CT ABDOMEN AND PELVIS WITH CONTRAST TECHNIQUE: Multidetector CT imaging of the abdomen and pelvis was performed using the standard protocol following bolus administration of intravenous contrast. RADIATION DOSE REDUCTION: This exam was performed according to the departmental dose-optimization program which includes automated exposure control, adjustment of the mA and/or kV according to patient size and/or use of iterative reconstruction technique. CONTRAST:  80mL OMNIPAQUE IOHEXOL 300 MG/ML  SOLN COMPARISON:  CT of the abdomen and pelvis 04/30/2023. FINDINGS: Lower chest: Atherosclerotic calcifications in the descending thoracic aorta as well as the left main, left anterior descending and left circumflex coronary arteries. Hepatobiliary: Diffuse low attenuation throughout the hepatic parenchyma, indicative of a background of hepatic steatosis. Liver contour is also slightly nodular, suggesting underlying cirrhosis. No discrete cystic or solid hepatic lesions. No intra or extrahepatic biliary ductal dilatation. Numerous calcified gallstones lying dependently within the gallbladder. Gallbladder is moderately distended. Gallbladder wall thickness is normal. No pericholecystic fluid or surrounding  inflammatory changes. Pancreas: No pancreatic mass. No pancreatic ductal dilatation. No pancreatic or peripancreatic fluid collections or inflammatory changes. Spleen: Unremarkable. Adrenals/Urinary Tract: Multiple low-attenuation lesions in both kidneys, most of which are too small to definitively characterize, but statistically likely to represent cysts (no imaging follow-up recommended). The largest lesion is in the interpolar region of the right kidney measuring 1.8 cm in diameter, compatible with a simple cyst (no imaging follow-up recommended). No aggressive appearing renal lesions. No hydroureteronephrosis. Right adrenal gland is normal in appearance. 1.9 x 1.6 cm left adrenal nodule, stable compared to prior examinations, previously characterized as a benign adenoma (no imaging follow-up recommended). Asymmetric mural thickening in the urinary bladder wall where there is also some subtle asymmetric urothelial hyperenhancement on the left (axial image 71 of series 2). Stomach/Bowel: The appearance of the stomach is normal. No pathologic dilatation of small bowel or colon. Numerous colonic diverticuli are noted, without surrounding inflammatory changes to indicate an acute diverticulitis at this time. Normal appendix. Vascular/Lymphatic: Atherosclerosis in the abdominal aorta and pelvic vasculature, without evidence of aneurysm or dissection. Several prominent but nonenlarged clustered left para-aortic lymph nodes are incidentally noted, nonspecific. No definite pathologically enlarged lymph nodes are identified in the abdomen or pelvis. Reproductive: Uterus and ovaries are atrophic, with dense calcifications associated with the left adnexal region, but otherwise unremarkable in appearance. Gas is once again noted in the vagina. There is focal outpouching of the anterior wall of the rectum toward the posterior wall of the vagina (sagittal image 74 of series 5), which could suggest presence of rectovaginal  fistula. Other: No significant volume of ascites.  No pneumoperitoneum. Musculoskeletal: There are no aggressive appearing lytic or blastic lesions noted in the visualized portions of the skeleton. IMPRESSION: 1. No findings to suggest bowel obstruction. 2. Asymmetric mural thickening and subtle urothelial hyperenhancement in the left side of the urinary bladder. This is nonspecific, but the possibility of developing urothelial malignancy should be considered. Nonemergent outpatient referral to Urology for further clinical evaluation is recommended. 3. Gas is noted within the lumen of the vagina, which is abnormal in a female patient in the absence of recent pelvic examination. Similar findings were evident on the prior study, including endometrial gas at that time. The possibility of rectovaginal fistula should be considered. Further clinical evaluation is recommended. 4. Colonic diverticulosis without evidence of acute diverticulitis at this time.  5. Hepatic steatosis with evidence of probable cirrhosis. 6. Cholelithiasis without evidence of acute cholecystitis at this time. 7. Aortic atherosclerosis. 8. Additional incidental findings, as above. Electronically Signed   By: Trudie Reed M.D.   On: 05/06/2023 07:07   CT Thoracic Spine Wo Contrast  Result Date: 05/06/2023 CLINICAL DATA:  Mid back pain.  Prior compression fracture. EXAM: CT THORACIC SPINE WITHOUT CONTRAST TECHNIQUE: Multidetector CT images of the thoracic were obtained using the standard protocol without intravenous contrast. RADIATION DOSE REDUCTION: This exam was performed according to the departmental dose-optimization program which includes automated exposure control, adjustment of the mA and/or kV according to patient size and/or use of iterative reconstruction technique. COMPARISON:  None Available. FINDINGS: Alignment: No traumatic malalignment Vertebrae: Subjective generalized osteopenia. No acute fracture. Chronic T12 and L1 superior  endplate fractures. Paraspinal and other soft tissues: Cholelithiasis. Generalized airway thickening with patchy atelectasis. 16 mm nodule in the left adrenal gland with Hounsfield units consistent with adenoma. Disc levels: Disc space narrowing with endplate and facet spurring diffusely. No neural impingement. IMPRESSION: 1. No acute finding. 2. Remote superior endplate fractures of T12 and L1. 3. Generalized spinal degeneration and mild scoliosis. 4. Cholelithiasis. Electronically Signed   By: Tiburcio Pea M.D.   On: 05/06/2023 07:00   CT Head Wo Contrast  Result Date: 05/06/2023 CLINICAL DATA:  Headache with increasing frequency or severity EXAM: CT HEAD WITHOUT CONTRAST TECHNIQUE: Contiguous axial images were obtained from the base of the skull through the vertex without intravenous contrast. RADIATION DOSE REDUCTION: This exam was performed according to the departmental dose-optimization program which includes automated exposure control, adjustment of the mA and/or kV according to patient size and/or use of iterative reconstruction technique. COMPARISON:  Six days prior FINDINGS: Brain: No evidence of acute infarction, hemorrhage, hydrocephalus, extra-axial collection or mass lesion/mass effect. Brain atrophy with ventriculomegaly. There is some disproportionate subarachnoid spaces and callosum angle narrowing. Chronic lacunar infarct at the left thalamus and right caudate. Vascular: No hyperdense vessel or unexpected calcification. Skull: Normal. Negative for fracture or focal lesion. Sinuses/Orbits: No acute finding. IMPRESSION: 1. No acute finding. 2. Ventriculomegaly with some features seen in normal pressure hydrocephalus. 3. Chronic small vessel ischemia including chronic lacunar infarcts. Electronically Signed   By: Tiburcio Pea M.D.   On: 05/06/2023 06:53   DG Chest Portable 1 View  Result Date: 05/06/2023 CLINICAL DATA:  77 year old female with history of weakness and pain. EXAM:  PORTABLE CHEST 1 VIEW COMPARISON:  Chest x-ray 07/04/2022. FINDINGS: Lung volumes are low. Chronic elevation of the right hemidiaphragm. Diffuse interstitial prominence and peribronchial cuffing, similar to the prior study. No consolidative airspace disease. No pleural effusions. No pneumothorax. No pulmonary nodule or mass noted. Pulmonary vasculature and the cardiomediastinal silhouette are within normal limits. Atherosclerotic calcifications are noted in the thoracic aorta. IMPRESSION: 1. Diffuse interstitial prominence and peribronchial cuffing, similar to the prior study, suggesting chronic bronchitis. No new acute findings are noted. 2. Chronic elevation of the right hemidiaphragm. 3. Aortic atherosclerosis. Electronically Signed   By: Trudie Reed M.D.   On: 05/06/2023 05:56    Procedures Procedures    Medications Ordered in ED Medications  methylnaltrexone (RELISTOR) injection 6 mg (has no administration in time range)  HYDROmorphone (DILAUDID) injection 1 mg (1 mg Intravenous Given 05/06/23 0556)  iohexol (OMNIPAQUE) 300 MG/ML solution 80 mL (80 mLs Intravenous Contrast Given 05/06/23 0631)    ED Course/ Medical Decision Making/ A&P  Medical Decision Making Amount and/or Complexity of Data Reviewed Labs: ordered. Radiology: ordered. ECG/medicine tests: ordered.  Risk Prescription drug management.  Difficult to ascertain how much of these symptoms are new versus chronic in nature.  She had a large workup last time however with still not having bowel movements abdominal pain and not passing gas concern for obstruction.  Also concern for possible cholecystitis since she has worse tenderness in her right upper quadrant.  Consider hypothyroidism.  Possibly hyperglycemia as she has had HHS in the past.  Tenderness to midline spine so we will need to make sure no fractures there specially if she truly has not any kind of new leg weakness.  I do not think  this is stroke as her neuroexam does not show any real asymmetric neurologic changes. Patient will get broad workup to evaluate for these things to include CT scans and labs.  Will treat for pain.  If she still has the same amount of stool burden is that she had previously she may need an enema to ensure that her bowels will move when she goes home.  Not dehydrated we will hold on fluids. After seeing CT scan from last time, rectal exam performed to ensure no impaction and her rectal vault is clear. Barely could reach soft stool as far as my finger would go, no impaction. Will consider enema +/- relistor if ct scan doesn't show any new abnormalities. On PDMP review she receives 180 10/325 norco every month. If she keeps having issues like this she may need to consider weaning that down a bit.   Daughter states that she has truly been using miralax and mag citrate the last week without any output. I reviewed cxr without obvious abnormalities. I reviewed/interpreted ct head without obvious bleed or stroke. I reviewed/interpreted CT A/P and it appears that stool burden may have decreased quite a bit compared to previous but might have a little stranding around the distal colon. Has air in and around stool, mild wall thickening. Kind of appears to be c/w stercoral colitis however I can't reach any stool to manually disimpact. Will give relistor/enema/decompress urinary bladder. Will await radiology read to ensure not missing any other crucial findings and ensure my interpretation is correct. Still pendign thyroid studies, urine studies however all other labs are reassuring aside from slight bump in creatinine. Radiology noted possible rectovaginal fistula. D/w surgery who recommended outpatient gyn follow up. Daughter updated, referral placed.  Radiology noted possible developing urothelial malignancy. Daughter updated, referral placed.    Care transferred pending medication administration, ensuring improvemeng  in symptoms and constipation and following up remaining labs for ultimate disposition.    Final Clinical Impression(s) / ED Diagnoses Final diagnoses:  Weakness  Diffuse pain  Constipation, unspecified constipation type  Urothelial lesion  Vaginal anomaly    Rx / DC Orders ED Discharge Orders          Ordered    Ambulatory referral to Obstetrics / Gynecology        05/06/23 0817    Ambulatory referral to Urology        05/06/23 0817              Adaley Kiene, Barbara Cower, MD 05/06/23 4098

## 2023-05-09 DIAGNOSIS — I129 Hypertensive chronic kidney disease with stage 1 through stage 4 chronic kidney disease, or unspecified chronic kidney disease: Secondary | ICD-10-CM | POA: Diagnosis not present

## 2023-05-09 DIAGNOSIS — K5901 Slow transit constipation: Secondary | ICD-10-CM | POA: Diagnosis not present

## 2023-05-09 DIAGNOSIS — G319 Degenerative disease of nervous system, unspecified: Secondary | ICD-10-CM | POA: Diagnosis not present

## 2023-05-09 DIAGNOSIS — M159 Polyosteoarthritis, unspecified: Secondary | ICD-10-CM | POA: Diagnosis not present

## 2023-05-09 DIAGNOSIS — R1084 Generalized abdominal pain: Secondary | ICD-10-CM | POA: Diagnosis not present

## 2023-05-09 DIAGNOSIS — E1165 Type 2 diabetes mellitus with hyperglycemia: Secondary | ICD-10-CM | POA: Diagnosis not present

## 2023-05-09 DIAGNOSIS — E1122 Type 2 diabetes mellitus with diabetic chronic kidney disease: Secondary | ICD-10-CM | POA: Diagnosis not present

## 2023-05-09 DIAGNOSIS — R5381 Other malaise: Secondary | ICD-10-CM | POA: Diagnosis not present

## 2023-05-09 DIAGNOSIS — N183 Chronic kidney disease, stage 3 unspecified: Secondary | ICD-10-CM | POA: Diagnosis not present

## 2023-05-10 ENCOUNTER — Ambulatory Visit: Payer: 59 | Admitting: Podiatry

## 2023-05-10 DIAGNOSIS — Z9981 Dependence on supplemental oxygen: Secondary | ICD-10-CM | POA: Diagnosis not present

## 2023-05-10 DIAGNOSIS — I129 Hypertensive chronic kidney disease with stage 1 through stage 4 chronic kidney disease, or unspecified chronic kidney disease: Secondary | ICD-10-CM | POA: Diagnosis not present

## 2023-05-10 DIAGNOSIS — E039 Hypothyroidism, unspecified: Secondary | ICD-10-CM | POA: Diagnosis not present

## 2023-05-10 DIAGNOSIS — Z7982 Long term (current) use of aspirin: Secondary | ICD-10-CM | POA: Diagnosis not present

## 2023-05-10 DIAGNOSIS — G319 Degenerative disease of nervous system, unspecified: Secondary | ICD-10-CM | POA: Diagnosis not present

## 2023-05-10 DIAGNOSIS — J9611 Chronic respiratory failure with hypoxia: Secondary | ICD-10-CM | POA: Diagnosis not present

## 2023-05-10 DIAGNOSIS — Z87440 Personal history of urinary (tract) infections: Secondary | ICD-10-CM | POA: Diagnosis not present

## 2023-05-10 DIAGNOSIS — Z9181 History of falling: Secondary | ICD-10-CM | POA: Diagnosis not present

## 2023-05-10 DIAGNOSIS — K5732 Diverticulitis of large intestine without perforation or abscess without bleeding: Secondary | ICD-10-CM | POA: Diagnosis not present

## 2023-05-10 DIAGNOSIS — F17210 Nicotine dependence, cigarettes, uncomplicated: Secondary | ICD-10-CM | POA: Diagnosis not present

## 2023-05-10 DIAGNOSIS — R296 Repeated falls: Secondary | ICD-10-CM | POA: Diagnosis not present

## 2023-05-10 DIAGNOSIS — M810 Age-related osteoporosis without current pathological fracture: Secondary | ICD-10-CM | POA: Diagnosis not present

## 2023-05-10 DIAGNOSIS — J449 Chronic obstructive pulmonary disease, unspecified: Secondary | ICD-10-CM | POA: Diagnosis not present

## 2023-05-10 DIAGNOSIS — I700 Atherosclerosis of aorta: Secondary | ICD-10-CM | POA: Diagnosis not present

## 2023-05-10 DIAGNOSIS — N390 Urinary tract infection, site not specified: Secondary | ICD-10-CM | POA: Diagnosis not present

## 2023-05-10 DIAGNOSIS — E1122 Type 2 diabetes mellitus with diabetic chronic kidney disease: Secondary | ICD-10-CM | POA: Diagnosis not present

## 2023-05-10 DIAGNOSIS — K5900 Constipation, unspecified: Secondary | ICD-10-CM | POA: Diagnosis not present

## 2023-05-10 DIAGNOSIS — N183 Chronic kidney disease, stage 3 unspecified: Secondary | ICD-10-CM | POA: Diagnosis not present

## 2023-05-10 DIAGNOSIS — Z8616 Personal history of COVID-19: Secondary | ICD-10-CM | POA: Diagnosis not present

## 2023-05-10 DIAGNOSIS — E46 Unspecified protein-calorie malnutrition: Secondary | ICD-10-CM | POA: Diagnosis not present

## 2023-05-10 DIAGNOSIS — G894 Chronic pain syndrome: Secondary | ICD-10-CM | POA: Diagnosis not present

## 2023-05-10 DIAGNOSIS — E1165 Type 2 diabetes mellitus with hyperglycemia: Secondary | ICD-10-CM | POA: Diagnosis not present

## 2023-05-10 DIAGNOSIS — Z556 Problems related to health literacy: Secondary | ICD-10-CM | POA: Diagnosis not present

## 2023-05-10 DIAGNOSIS — M159 Polyosteoarthritis, unspecified: Secondary | ICD-10-CM | POA: Diagnosis not present

## 2023-05-15 DIAGNOSIS — G319 Degenerative disease of nervous system, unspecified: Secondary | ICD-10-CM | POA: Diagnosis not present

## 2023-05-15 DIAGNOSIS — K5900 Constipation, unspecified: Secondary | ICD-10-CM | POA: Diagnosis not present

## 2023-05-15 DIAGNOSIS — E1165 Type 2 diabetes mellitus with hyperglycemia: Secondary | ICD-10-CM | POA: Diagnosis not present

## 2023-05-15 DIAGNOSIS — J449 Chronic obstructive pulmonary disease, unspecified: Secondary | ICD-10-CM | POA: Diagnosis not present

## 2023-05-15 DIAGNOSIS — M159 Polyosteoarthritis, unspecified: Secondary | ICD-10-CM | POA: Diagnosis not present

## 2023-05-15 DIAGNOSIS — F17210 Nicotine dependence, cigarettes, uncomplicated: Secondary | ICD-10-CM | POA: Diagnosis not present

## 2023-05-15 DIAGNOSIS — E039 Hypothyroidism, unspecified: Secondary | ICD-10-CM | POA: Diagnosis not present

## 2023-05-15 DIAGNOSIS — G894 Chronic pain syndrome: Secondary | ICD-10-CM | POA: Diagnosis not present

## 2023-05-15 DIAGNOSIS — R296 Repeated falls: Secondary | ICD-10-CM | POA: Diagnosis not present

## 2023-05-15 DIAGNOSIS — Z87440 Personal history of urinary (tract) infections: Secondary | ICD-10-CM | POA: Diagnosis not present

## 2023-05-15 DIAGNOSIS — E1122 Type 2 diabetes mellitus with diabetic chronic kidney disease: Secondary | ICD-10-CM | POA: Diagnosis not present

## 2023-05-15 DIAGNOSIS — M810 Age-related osteoporosis without current pathological fracture: Secondary | ICD-10-CM | POA: Diagnosis not present

## 2023-05-15 DIAGNOSIS — J9611 Chronic respiratory failure with hypoxia: Secondary | ICD-10-CM | POA: Diagnosis not present

## 2023-05-15 DIAGNOSIS — I129 Hypertensive chronic kidney disease with stage 1 through stage 4 chronic kidney disease, or unspecified chronic kidney disease: Secondary | ICD-10-CM | POA: Diagnosis not present

## 2023-05-15 DIAGNOSIS — E46 Unspecified protein-calorie malnutrition: Secondary | ICD-10-CM | POA: Diagnosis not present

## 2023-05-15 DIAGNOSIS — N390 Urinary tract infection, site not specified: Secondary | ICD-10-CM | POA: Diagnosis not present

## 2023-05-15 DIAGNOSIS — K5732 Diverticulitis of large intestine without perforation or abscess without bleeding: Secondary | ICD-10-CM | POA: Diagnosis not present

## 2023-05-15 DIAGNOSIS — I700 Atherosclerosis of aorta: Secondary | ICD-10-CM | POA: Diagnosis not present

## 2023-05-15 DIAGNOSIS — Z9181 History of falling: Secondary | ICD-10-CM | POA: Diagnosis not present

## 2023-05-15 DIAGNOSIS — N183 Chronic kidney disease, stage 3 unspecified: Secondary | ICD-10-CM | POA: Diagnosis not present

## 2023-05-15 DIAGNOSIS — Z8616 Personal history of COVID-19: Secondary | ICD-10-CM | POA: Diagnosis not present

## 2023-05-15 DIAGNOSIS — Z7982 Long term (current) use of aspirin: Secondary | ICD-10-CM | POA: Diagnosis not present

## 2023-05-15 DIAGNOSIS — Z9981 Dependence on supplemental oxygen: Secondary | ICD-10-CM | POA: Diagnosis not present

## 2023-05-15 DIAGNOSIS — Z556 Problems related to health literacy: Secondary | ICD-10-CM | POA: Diagnosis not present

## 2023-05-17 ENCOUNTER — Ambulatory Visit: Payer: 59 | Admitting: Urology

## 2023-05-18 DIAGNOSIS — F17210 Nicotine dependence, cigarettes, uncomplicated: Secondary | ICD-10-CM | POA: Diagnosis not present

## 2023-05-18 DIAGNOSIS — J449 Chronic obstructive pulmonary disease, unspecified: Secondary | ICD-10-CM | POA: Diagnosis not present

## 2023-05-18 DIAGNOSIS — J9611 Chronic respiratory failure with hypoxia: Secondary | ICD-10-CM | POA: Diagnosis not present

## 2023-05-18 DIAGNOSIS — K5900 Constipation, unspecified: Secondary | ICD-10-CM | POA: Diagnosis not present

## 2023-05-18 DIAGNOSIS — K5732 Diverticulitis of large intestine without perforation or abscess without bleeding: Secondary | ICD-10-CM | POA: Diagnosis not present

## 2023-05-18 DIAGNOSIS — M159 Polyosteoarthritis, unspecified: Secondary | ICD-10-CM | POA: Diagnosis not present

## 2023-05-18 DIAGNOSIS — N390 Urinary tract infection, site not specified: Secondary | ICD-10-CM | POA: Diagnosis not present

## 2023-05-18 DIAGNOSIS — Z8616 Personal history of COVID-19: Secondary | ICD-10-CM | POA: Diagnosis not present

## 2023-05-18 DIAGNOSIS — E1165 Type 2 diabetes mellitus with hyperglycemia: Secondary | ICD-10-CM | POA: Diagnosis not present

## 2023-05-18 DIAGNOSIS — E46 Unspecified protein-calorie malnutrition: Secondary | ICD-10-CM | POA: Diagnosis not present

## 2023-05-18 DIAGNOSIS — G319 Degenerative disease of nervous system, unspecified: Secondary | ICD-10-CM | POA: Diagnosis not present

## 2023-05-18 DIAGNOSIS — G894 Chronic pain syndrome: Secondary | ICD-10-CM | POA: Diagnosis not present

## 2023-05-18 DIAGNOSIS — R296 Repeated falls: Secondary | ICD-10-CM | POA: Diagnosis not present

## 2023-05-18 DIAGNOSIS — I700 Atherosclerosis of aorta: Secondary | ICD-10-CM | POA: Diagnosis not present

## 2023-05-18 DIAGNOSIS — Z9981 Dependence on supplemental oxygen: Secondary | ICD-10-CM | POA: Diagnosis not present

## 2023-05-18 DIAGNOSIS — I129 Hypertensive chronic kidney disease with stage 1 through stage 4 chronic kidney disease, or unspecified chronic kidney disease: Secondary | ICD-10-CM | POA: Diagnosis not present

## 2023-05-18 DIAGNOSIS — N183 Chronic kidney disease, stage 3 unspecified: Secondary | ICD-10-CM | POA: Diagnosis not present

## 2023-05-18 DIAGNOSIS — Z9181 History of falling: Secondary | ICD-10-CM | POA: Diagnosis not present

## 2023-05-18 DIAGNOSIS — Z87440 Personal history of urinary (tract) infections: Secondary | ICD-10-CM | POA: Diagnosis not present

## 2023-05-18 DIAGNOSIS — Z556 Problems related to health literacy: Secondary | ICD-10-CM | POA: Diagnosis not present

## 2023-05-18 DIAGNOSIS — M810 Age-related osteoporosis without current pathological fracture: Secondary | ICD-10-CM | POA: Diagnosis not present

## 2023-05-18 DIAGNOSIS — E039 Hypothyroidism, unspecified: Secondary | ICD-10-CM | POA: Diagnosis not present

## 2023-05-18 DIAGNOSIS — Z7982 Long term (current) use of aspirin: Secondary | ICD-10-CM | POA: Diagnosis not present

## 2023-05-18 DIAGNOSIS — E1122 Type 2 diabetes mellitus with diabetic chronic kidney disease: Secondary | ICD-10-CM | POA: Diagnosis not present

## 2023-05-22 ENCOUNTER — Ambulatory Visit: Payer: 59 | Admitting: Podiatry

## 2023-05-23 DIAGNOSIS — K5900 Constipation, unspecified: Secondary | ICD-10-CM | POA: Diagnosis not present

## 2023-05-23 DIAGNOSIS — G319 Degenerative disease of nervous system, unspecified: Secondary | ICD-10-CM | POA: Diagnosis not present

## 2023-05-23 DIAGNOSIS — E1165 Type 2 diabetes mellitus with hyperglycemia: Secondary | ICD-10-CM | POA: Diagnosis not present

## 2023-05-23 DIAGNOSIS — Z87440 Personal history of urinary (tract) infections: Secondary | ICD-10-CM | POA: Diagnosis not present

## 2023-05-23 DIAGNOSIS — I700 Atherosclerosis of aorta: Secondary | ICD-10-CM | POA: Diagnosis not present

## 2023-05-23 DIAGNOSIS — E1122 Type 2 diabetes mellitus with diabetic chronic kidney disease: Secondary | ICD-10-CM | POA: Diagnosis not present

## 2023-05-23 DIAGNOSIS — J9611 Chronic respiratory failure with hypoxia: Secondary | ICD-10-CM | POA: Diagnosis not present

## 2023-05-23 DIAGNOSIS — Z9181 History of falling: Secondary | ICD-10-CM | POA: Diagnosis not present

## 2023-05-23 DIAGNOSIS — Z8616 Personal history of COVID-19: Secondary | ICD-10-CM | POA: Diagnosis not present

## 2023-05-23 DIAGNOSIS — E039 Hypothyroidism, unspecified: Secondary | ICD-10-CM | POA: Diagnosis not present

## 2023-05-23 DIAGNOSIS — N390 Urinary tract infection, site not specified: Secondary | ICD-10-CM | POA: Diagnosis not present

## 2023-05-23 DIAGNOSIS — M159 Polyosteoarthritis, unspecified: Secondary | ICD-10-CM | POA: Diagnosis not present

## 2023-05-23 DIAGNOSIS — M810 Age-related osteoporosis without current pathological fracture: Secondary | ICD-10-CM | POA: Diagnosis not present

## 2023-05-23 DIAGNOSIS — E46 Unspecified protein-calorie malnutrition: Secondary | ICD-10-CM | POA: Diagnosis not present

## 2023-05-23 DIAGNOSIS — K5732 Diverticulitis of large intestine without perforation or abscess without bleeding: Secondary | ICD-10-CM | POA: Diagnosis not present

## 2023-05-23 DIAGNOSIS — Z556 Problems related to health literacy: Secondary | ICD-10-CM | POA: Diagnosis not present

## 2023-05-23 DIAGNOSIS — Z9981 Dependence on supplemental oxygen: Secondary | ICD-10-CM | POA: Diagnosis not present

## 2023-05-23 DIAGNOSIS — N183 Chronic kidney disease, stage 3 unspecified: Secondary | ICD-10-CM | POA: Diagnosis not present

## 2023-05-23 DIAGNOSIS — G894 Chronic pain syndrome: Secondary | ICD-10-CM | POA: Diagnosis not present

## 2023-05-23 DIAGNOSIS — J449 Chronic obstructive pulmonary disease, unspecified: Secondary | ICD-10-CM | POA: Diagnosis not present

## 2023-05-23 DIAGNOSIS — Z7982 Long term (current) use of aspirin: Secondary | ICD-10-CM | POA: Diagnosis not present

## 2023-05-23 DIAGNOSIS — R296 Repeated falls: Secondary | ICD-10-CM | POA: Diagnosis not present

## 2023-05-23 DIAGNOSIS — F17210 Nicotine dependence, cigarettes, uncomplicated: Secondary | ICD-10-CM | POA: Diagnosis not present

## 2023-05-23 DIAGNOSIS — I129 Hypertensive chronic kidney disease with stage 1 through stage 4 chronic kidney disease, or unspecified chronic kidney disease: Secondary | ICD-10-CM | POA: Diagnosis not present

## 2023-05-24 ENCOUNTER — Ambulatory Visit: Payer: 59 | Admitting: Internal Medicine

## 2023-05-26 DIAGNOSIS — L89152 Pressure ulcer of sacral region, stage 2: Secondary | ICD-10-CM | POA: Diagnosis not present

## 2023-05-26 DIAGNOSIS — L89312 Pressure ulcer of right buttock, stage 2: Secondary | ICD-10-CM | POA: Diagnosis not present

## 2023-05-29 ENCOUNTER — Other Ambulatory Visit: Payer: Self-pay | Admitting: *Deleted

## 2023-05-29 DIAGNOSIS — G319 Degenerative disease of nervous system, unspecified: Secondary | ICD-10-CM | POA: Diagnosis not present

## 2023-05-29 DIAGNOSIS — Z7982 Long term (current) use of aspirin: Secondary | ICD-10-CM | POA: Diagnosis not present

## 2023-05-29 DIAGNOSIS — R296 Repeated falls: Secondary | ICD-10-CM | POA: Diagnosis not present

## 2023-05-29 DIAGNOSIS — E039 Hypothyroidism, unspecified: Secondary | ICD-10-CM | POA: Diagnosis not present

## 2023-05-29 DIAGNOSIS — G894 Chronic pain syndrome: Secondary | ICD-10-CM | POA: Diagnosis not present

## 2023-05-29 DIAGNOSIS — Z8616 Personal history of COVID-19: Secondary | ICD-10-CM | POA: Diagnosis not present

## 2023-05-29 DIAGNOSIS — Z556 Problems related to health literacy: Secondary | ICD-10-CM | POA: Diagnosis not present

## 2023-05-29 DIAGNOSIS — E46 Unspecified protein-calorie malnutrition: Secondary | ICD-10-CM | POA: Diagnosis not present

## 2023-05-29 DIAGNOSIS — M159 Polyosteoarthritis, unspecified: Secondary | ICD-10-CM | POA: Diagnosis not present

## 2023-05-29 DIAGNOSIS — Z87440 Personal history of urinary (tract) infections: Secondary | ICD-10-CM | POA: Diagnosis not present

## 2023-05-29 DIAGNOSIS — Z9981 Dependence on supplemental oxygen: Secondary | ICD-10-CM | POA: Diagnosis not present

## 2023-05-29 DIAGNOSIS — I700 Atherosclerosis of aorta: Secondary | ICD-10-CM | POA: Diagnosis not present

## 2023-05-29 DIAGNOSIS — K5900 Constipation, unspecified: Secondary | ICD-10-CM | POA: Diagnosis not present

## 2023-05-29 DIAGNOSIS — J449 Chronic obstructive pulmonary disease, unspecified: Secondary | ICD-10-CM | POA: Diagnosis not present

## 2023-05-29 DIAGNOSIS — K5732 Diverticulitis of large intestine without perforation or abscess without bleeding: Secondary | ICD-10-CM | POA: Diagnosis not present

## 2023-05-29 DIAGNOSIS — I129 Hypertensive chronic kidney disease with stage 1 through stage 4 chronic kidney disease, or unspecified chronic kidney disease: Secondary | ICD-10-CM | POA: Diagnosis not present

## 2023-05-29 DIAGNOSIS — N390 Urinary tract infection, site not specified: Secondary | ICD-10-CM | POA: Diagnosis not present

## 2023-05-29 DIAGNOSIS — J9611 Chronic respiratory failure with hypoxia: Secondary | ICD-10-CM | POA: Diagnosis not present

## 2023-05-29 DIAGNOSIS — E1122 Type 2 diabetes mellitus with diabetic chronic kidney disease: Secondary | ICD-10-CM | POA: Diagnosis not present

## 2023-05-29 DIAGNOSIS — L89159 Pressure ulcer of sacral region, unspecified stage: Secondary | ICD-10-CM

## 2023-05-29 DIAGNOSIS — E1165 Type 2 diabetes mellitus with hyperglycemia: Secondary | ICD-10-CM | POA: Diagnosis not present

## 2023-05-29 DIAGNOSIS — M810 Age-related osteoporosis without current pathological fracture: Secondary | ICD-10-CM | POA: Diagnosis not present

## 2023-05-29 DIAGNOSIS — Z9181 History of falling: Secondary | ICD-10-CM | POA: Diagnosis not present

## 2023-05-29 DIAGNOSIS — N183 Chronic kidney disease, stage 3 unspecified: Secondary | ICD-10-CM | POA: Diagnosis not present

## 2023-05-29 DIAGNOSIS — F17210 Nicotine dependence, cigarettes, uncomplicated: Secondary | ICD-10-CM | POA: Diagnosis not present

## 2023-05-30 DIAGNOSIS — I700 Atherosclerosis of aorta: Secondary | ICD-10-CM | POA: Diagnosis not present

## 2023-05-30 DIAGNOSIS — G319 Degenerative disease of nervous system, unspecified: Secondary | ICD-10-CM | POA: Diagnosis not present

## 2023-05-30 DIAGNOSIS — J449 Chronic obstructive pulmonary disease, unspecified: Secondary | ICD-10-CM | POA: Diagnosis not present

## 2023-05-30 DIAGNOSIS — E1165 Type 2 diabetes mellitus with hyperglycemia: Secondary | ICD-10-CM | POA: Diagnosis not present

## 2023-05-31 ENCOUNTER — Telehealth: Payer: Self-pay

## 2023-05-31 ENCOUNTER — Ambulatory Visit: Payer: 59 | Admitting: Urology

## 2023-05-31 VITALS — BP 100/68 | HR 75

## 2023-05-31 DIAGNOSIS — Z9181 History of falling: Secondary | ICD-10-CM | POA: Diagnosis not present

## 2023-05-31 DIAGNOSIS — K5900 Constipation, unspecified: Secondary | ICD-10-CM | POA: Diagnosis not present

## 2023-05-31 DIAGNOSIS — I700 Atherosclerosis of aorta: Secondary | ICD-10-CM | POA: Diagnosis not present

## 2023-05-31 DIAGNOSIS — E039 Hypothyroidism, unspecified: Secondary | ICD-10-CM | POA: Diagnosis not present

## 2023-05-31 DIAGNOSIS — M810 Age-related osteoporosis without current pathological fracture: Secondary | ICD-10-CM | POA: Diagnosis not present

## 2023-05-31 DIAGNOSIS — Z556 Problems related to health literacy: Secondary | ICD-10-CM | POA: Diagnosis not present

## 2023-05-31 DIAGNOSIS — R296 Repeated falls: Secondary | ICD-10-CM | POA: Diagnosis not present

## 2023-05-31 DIAGNOSIS — J9611 Chronic respiratory failure with hypoxia: Secondary | ICD-10-CM | POA: Diagnosis not present

## 2023-05-31 DIAGNOSIS — N3289 Other specified disorders of bladder: Secondary | ICD-10-CM | POA: Diagnosis not present

## 2023-05-31 DIAGNOSIS — F17210 Nicotine dependence, cigarettes, uncomplicated: Secondary | ICD-10-CM | POA: Diagnosis not present

## 2023-05-31 DIAGNOSIS — Z9981 Dependence on supplemental oxygen: Secondary | ICD-10-CM | POA: Diagnosis not present

## 2023-05-31 DIAGNOSIS — N390 Urinary tract infection, site not specified: Secondary | ICD-10-CM | POA: Diagnosis not present

## 2023-05-31 DIAGNOSIS — Z87440 Personal history of urinary (tract) infections: Secondary | ICD-10-CM | POA: Diagnosis not present

## 2023-05-31 DIAGNOSIS — N289 Disorder of kidney and ureter, unspecified: Secondary | ICD-10-CM

## 2023-05-31 DIAGNOSIS — M159 Polyosteoarthritis, unspecified: Secondary | ICD-10-CM | POA: Diagnosis not present

## 2023-05-31 DIAGNOSIS — G319 Degenerative disease of nervous system, unspecified: Secondary | ICD-10-CM | POA: Diagnosis not present

## 2023-05-31 DIAGNOSIS — J449 Chronic obstructive pulmonary disease, unspecified: Secondary | ICD-10-CM | POA: Diagnosis not present

## 2023-05-31 DIAGNOSIS — G894 Chronic pain syndrome: Secondary | ICD-10-CM | POA: Diagnosis not present

## 2023-05-31 DIAGNOSIS — I129 Hypertensive chronic kidney disease with stage 1 through stage 4 chronic kidney disease, or unspecified chronic kidney disease: Secondary | ICD-10-CM | POA: Diagnosis not present

## 2023-05-31 DIAGNOSIS — Z8616 Personal history of COVID-19: Secondary | ICD-10-CM | POA: Diagnosis not present

## 2023-05-31 DIAGNOSIS — E1122 Type 2 diabetes mellitus with diabetic chronic kidney disease: Secondary | ICD-10-CM | POA: Diagnosis not present

## 2023-05-31 DIAGNOSIS — Z7982 Long term (current) use of aspirin: Secondary | ICD-10-CM | POA: Diagnosis not present

## 2023-05-31 DIAGNOSIS — E46 Unspecified protein-calorie malnutrition: Secondary | ICD-10-CM | POA: Diagnosis not present

## 2023-05-31 DIAGNOSIS — E1165 Type 2 diabetes mellitus with hyperglycemia: Secondary | ICD-10-CM | POA: Diagnosis not present

## 2023-05-31 DIAGNOSIS — K5732 Diverticulitis of large intestine without perforation or abscess without bleeding: Secondary | ICD-10-CM | POA: Diagnosis not present

## 2023-05-31 DIAGNOSIS — N183 Chronic kidney disease, stage 3 unspecified: Secondary | ICD-10-CM | POA: Diagnosis not present

## 2023-05-31 NOTE — Progress Notes (Signed)
05/31/2023 3:28 PM   [name] 08/26/1945 [id number]  Referring provider: Marily Memos, MD [address] [place],  [place] [address]  Bladder mass   HPI: [name] is a 77yo here for evaluation of micro hematuria and a bladder mass. She underwent CT on 10/19 which showed thickening of the dome and left lateral wall of the bladder. She has known microhematuria. She denies any recurrent UTI. She has intermittent dysuria and bothersome urinary frequency.   PMH: 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    pt stated that she has intermittent sx   Clostridium difficile infection    Daytime somnolence    Frequent falls    Headache(784.0)    SINUS HEADACHE OCCASSIONALLY   Hypertension    Hypothyroidism    Multifactorial gait disorder    Osteoporosis    Right foot drop    Snoring    Stroke (HCC)    Tobacco use    Vertigo    Weakness     Surgical History: Past Surgical History:  Procedure Laterality Date   CATARACT EXTRACTION W/PHACO Right 06/28/2016   Procedure: CATARACT EXTRACTION PHACO AND INTRAOCULAR LENS PLACEMENT (IOC);  Surgeon: Jethro Bolus, MD;  Location: [facility];  Service: Ophthalmology;  Laterality: Right;  CDE: 11.10   CATARACT EXTRACTION W/PHACO Left 07/26/2016   Procedure: CATARACT EXTRACTION PHACO AND INTRAOCULAR LENS PLACEMENT (IOC);  Surgeon: Jethro Bolus, MD;  Location: [facility];  Service: Ophthalmology;  Laterality: Left;  CDE: 8.88   JOINT REPLACEMENT  11/08/3010   right total knee   TOTAL KNEE ARTHROPLASTY  11/07/2011   Procedure: TOTAL KNEE ARTHROPLASTY;  Surgeon: Nilda Simmer, MD;  Location: [facility];  Service: Orthopedics;  Laterality: Left;  DR Thurston Hole WANTS 90 MINUTES FOR THIS CASE   TOTAL KNEE ARTHROPLASTY Right 2011   TUBAL LIGATION  1982    Home Medications:  Allergies as of 05/31/2023       Reactions   Iron Other  (See Comments)   "upset stomach"   Oxycodone Other (See Comments)   "CX HER TO FELL LIKE SHE IS OUT OF HER BODY"   Sudafed [pseudoephedrine Hcl] Other (See Comments)   "funny feeling"        Medication List        Accurate as of May 31, 2023  3:28 PM. If you have any questions, ask your nurse or doctor.          Accu-Chek Guide test strip Generic drug: glucose blood USE TEST STRIP TO CHECK GLUCOSE TWICE DAILY BEFORE BREAKFAST AND  BEFORE BEDTIME   Accu-Chek Softclix Lancets lancets USE  TO CHECK GLUCOSE TWICE DAILY   acetaminophen 500 MG tablet Commonly known as: TYLENOL Take 1,000 mg by mouth every 6 (six) hours as needed for mild pain.   alendronate 70 MG tablet Commonly known as: FOSAMAX Take 70 mg by mouth once a week.   ALPRAZolam 1 MG tablet Commonly known as: XANAX Take 1 mg by mouth at bedtime.   Anoro Ellipta 62.5-25 MCG/ACT Aepb Generic drug: umeclidinium-vilanterol Inhale 1 puff into the lungs daily.   aspirin EC 81 MG tablet Take 81 mg by mouth in the morning.   atorvastatin 20 MG tablet Commonly known as: LIPITOR Take 20 mg by mouth daily at 12 noon.   blood glucose meter  kit and supplies Dispense based on patient and insurance preference. Use up to four times daily as directed. (FOR ICD-10 E10.9, E11.9).   donepezil 5 MG tablet Commonly known as: ARICEPT Take 5 mg by mouth at bedtime.   furosemide 20 MG tablet Commonly known as: LASIX Take 20 mg by mouth daily.   HYDROcodone-acetaminophen 10-325 MG tablet Commonly known as: NORCO Take 1 tablet by mouth every 4 (four) hours as needed for moderate pain.   levothyroxine 75 MCG tablet Commonly known as: SYNTHROID Take 1 tablet (75 mcg total) by mouth daily before breakfast.   OXYGEN Inhale 1 L/min into the lungs daily.   Pen Needles 3/16" 31G X 5 MM Misc 100 each by Does not apply route 2 (two) times daily.   ReliOn Pen Needles 31G X 6 MM Misc Generic drug: Insulin Pen  Needle Use to inject insulin once daily.   polyethylene glycol 17 g packet Commonly known as: MiraLax Take 17 g by mouth daily.   Vitamin D3 50 MCG (2000 UT) capsule Take 2,000 Units by mouth daily.        Allergies:  Allergies  Allergen Reactions   Iron Other (See Comments)    "upset stomach"   Oxycodone Other (See Comments)    "CX HER TO FELL LIKE SHE IS OUT OF HER BODY"   Sudafed [Pseudoephedrine Hcl] Other (See Comments)    "funny feeling"    Family History: Family History  Problem Relation Age of Onset   Heart attack Mother    COPD Father    Arthritis Sister    Heart disease Brother     Social History:  reports that she has been smoking cigarettes. She has a 48 pack-year smoking history. She has never used smokeless tobacco. She reports that she does not drink alcohol and does not use drugs.  ROS: All other review of systems were reviewed and are negative except what is noted above in HPI  Physical Exam: BP 100/68   Pulse 75   Constitutional:  Alert and oriented, No acute distress. HEENT: [place] AT, moist mucus membranes.  Trachea midline, no masses. Cardiovascular: No clubbing, cyanosis, or edema. Respiratory: Normal respiratory effort, no increased work of breathing. GI: Abdomen is soft, nontender, nondistended, no abdominal masses GU: No CVA tenderness.  Lymph: No cervical or inguinal lymphadenopathy. Skin: No rashes, bruises or suspicious lesions. Neurologic: Grossly intact, no focal deficits, moving all 4 extremities. Psychiatric: Normal mood and affect.  Laboratory Data: Lab Results  Component Value Date   WBC 6.3 05/06/2023   HGB 13.0 05/06/2023   HCT 40.5 05/06/2023   MCV 92.3 05/06/2023   PLT 193 05/06/2023    Lab Results  Component Value Date   CREATININE 1.63 (H) 05/06/2023    No results found for: "PSA"  No results found for: "TESTOSTERONE"  Lab Results  Component Value Date   HGBA1C 5.8 (A) 02/27/2023    Urinalysis     Component Value Date/Time   COLORURINE YELLOW 05/06/2023 0913   APPEARANCEUR TURBID (A) 05/06/2023 0913   LABSPEC >1.046 (H) 05/06/2023 0913   PHURINE 6.0 05/06/2023 0913   GLUCOSEU NEGATIVE 05/06/2023 0913   HGBUR NEGATIVE 05/06/2023 0913   BILIRUBINUR NEGATIVE 05/06/2023 0913   KETONESUR NEGATIVE 05/06/2023 0913   PROTEINUR 30 (A) 05/06/2023 0913   UROBILINOGEN 0.2 08/27/2013 1130   NITRITE NEGATIVE 05/06/2023 0913   LEUKOCYTESUR LARGE (A) 05/06/2023 0913    Lab Results  Component Value Date   BACTERIA RARE (A) 05/06/2023  Pertinent Imaging: CT 05/06/2023: Images reviewed and discussed with the patient  No results found for this or any previous visit.  No results found for this or any previous visit.  No results found for this or any previous visit.  No results found for this or any previous visit.  No results found for this or any previous visit.  No valid procedures specified. No results found for this or any previous visit.  No results found for this or any previous visit.    Cystoscopy Procedure Note  Patient identification was confirmed, informed consent was obtained, and patient was prepped using Betadine solution.  Lidocaine jelly was administered per urethral meatus.    Procedure: - Flexible cystoscope introduced, without any difficulty.   - Thorough search of the bladder revealed:    normal urethral meatus    normal urothelium    no stones    no ulcers     5cm sessile tumor incolving left lateral wall and dome    no urethral polyps    no trabeculation  - Ureteral orifices were normal in position and appearance.  Post-Procedure: - Patient tolerated the procedure well   Assessment & Plan:    1. Bladder mass The risks/benefits/alternatives to transurethral resection of a bladder tumor was explained to the patient and he understands and wishes to proceed with surgery   No follow-ups on file.  Wilkie Aye, MD  [facility]

## 2023-05-31 NOTE — Telephone Encounter (Signed)
Patient did not take Cipro abx after cysto.  Do you want to send in a rx to pharmacy?

## 2023-06-01 ENCOUNTER — Ambulatory Visit (INDEPENDENT_AMBULATORY_CARE_PROVIDER_SITE_OTHER): Payer: 59 | Admitting: General Surgery

## 2023-06-01 ENCOUNTER — Encounter: Payer: Self-pay | Admitting: General Surgery

## 2023-06-01 VITALS — BP 102/65 | HR 60 | Temp 98.1°F | Resp 14 | Ht <= 58 in | Wt 163.0 lb

## 2023-06-01 DIAGNOSIS — J9601 Acute respiratory failure with hypoxia: Secondary | ICD-10-CM | POA: Diagnosis not present

## 2023-06-01 DIAGNOSIS — L89159 Pressure ulcer of sacral region, unspecified stage: Secondary | ICD-10-CM

## 2023-06-01 DIAGNOSIS — U071 COVID-19: Secondary | ICD-10-CM | POA: Diagnosis not present

## 2023-06-01 DIAGNOSIS — J449 Chronic obstructive pulmonary disease, unspecified: Secondary | ICD-10-CM | POA: Diagnosis not present

## 2023-06-01 NOTE — Progress Notes (Signed)
Amanda Armstrong; [id number]; 1945/12/31   HPI Patient is a 77 year old white female who was referred to my care by Dr. Sherwood Gambler for evaluation and treatment of a pressure ulcer on her buttocks.  Patient has multiple medical problems and is confined to a wheelchair.  She was called in a cream and a visiting nurse has started to come out to see the patient.  No drainage has been noted.  Patient denies any pain. 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    pt stated that she has intermittent sx   Clostridium difficile infection    Daytime somnolence    Frequent falls    Headache(784.0)    SINUS HEADACHE OCCASSIONALLY   Hypertension    Hypothyroidism    Multifactorial gait disorder    Osteoporosis    Right foot drop    Snoring    Stroke (HCC)    Tobacco use    Vertigo    Weakness     Past Surgical History:  Procedure Laterality Date   CATARACT EXTRACTION W/PHACO Right 06/28/2016   Procedure: CATARACT EXTRACTION PHACO AND INTRAOCULAR LENS PLACEMENT (IOC);  Surgeon: Jethro Bolus, MD;  Location: [facility];  Service: Ophthalmology;  Laterality: Right;  CDE: 11.10   CATARACT EXTRACTION W/PHACO Left 07/26/2016   Procedure: CATARACT EXTRACTION PHACO AND INTRAOCULAR LENS PLACEMENT (IOC);  Surgeon: Jethro Bolus, MD;  Location: [facility];  Service: Ophthalmology;  Laterality: Left;  CDE: 8.88   JOINT REPLACEMENT  11/08/3010   right total knee   TOTAL KNEE ARTHROPLASTY  11/07/2011   Procedure: TOTAL KNEE ARTHROPLASTY;  Surgeon: Nilda Simmer, MD;  Location: [facility];  Service: Orthopedics;  Laterality: Left;  DR Anne Ng 90 MINUTES FOR THIS CASE   TOTAL KNEE ARTHROPLASTY Right 2011   TUBAL LIGATION  1982    Family History  Problem Relation Age of Onset   Heart attack Mother    COPD Father    Arthritis Sister    Heart disease Brother     Current Outpatient Medications on  File Prior to Visit  Medication Sig Dispense Refill   ACCU-CHEK GUIDE test strip USE TEST STRIP TO CHECK GLUCOSE TWICE DAILY BEFORE BREAKFAST AND  BEFORE BEDTIME 200 each 2   Accu-Chek Softclix Lancets lancets USE  TO CHECK GLUCOSE TWICE DAILY 100 each 2   acetaminophen (TYLENOL) 500 MG tablet Take 1,000 mg by mouth every 6 (six) hours as needed for mild pain.     alendronate (FOSAMAX) 70 MG tablet Take 70 mg by mouth once a week.     ALPRAZolam (XANAX) 1 MG tablet Take 1 mg by mouth at bedtime.     ANORO ELLIPTA 62.5-25 MCG/ACT AEPB Inhale 1 puff into the lungs daily.     aspirin 81 MG EC tablet Take 81 mg by mouth in the morning.     atorvastatin (LIPITOR) 20 MG tablet Take 20 mg by mouth daily at 12 noon.     blood glucose meter kit and supplies Dispense based on patient and insurance preference. Use up to four times daily as directed. (FOR ICD-10 E10.9, E11.9). 1 each 0   Cholecalciferol (VITAMIN D3) 50 MCG (2000 UT) capsule Take 2,000 Units by mouth daily.     donepezil (ARICEPT) 5 MG tablet Take 5 mg by mouth at bedtime.  furosemide (LASIX) 20 MG tablet Take 20 mg by mouth daily.     HYDROcodone-acetaminophen (NORCO) 10-325 MG tablet Take 1 tablet by mouth every 4 (four) hours as needed for moderate pain.     Insulin Pen Needle (PEN NEEDLES 3/16") 31G X 5 MM MISC 100 each by Does not apply route 2 (two) times daily. 100 each 1   levothyroxine (SYNTHROID) 75 MCG tablet Take 1 tablet (75 mcg total) by mouth daily before breakfast. 90 tablet 1   OXYGEN Inhale 1 L/min into the lungs daily.     polyethylene glycol (MIRALAX) 17 g packet Take 17 g by mouth daily. 14 each 0   RELION PEN NEEDLES 31G X 6 MM MISC Use to inject insulin once daily. 100 each 1   No current facility-administered medications on file prior to visit.    Allergies  Allergen Reactions   Iron Other (See Comments)    "upset stomach"   Oxycodone Other (See Comments)    "CX HER TO FELL LIKE SHE IS OUT OF HER BODY"    Sudafed [Pseudoephedrine Hcl] Other (See Comments)    "funny feeling"    Social History   Substance and Sexual Activity  Alcohol Use No   Alcohol/week: 0.0 standard drinks of alcohol    Social History   Tobacco Use  Smoking Status Every Day   Current packs/day: 1.00   Average packs/day: 1 pack/day for 48.0 years (48.0 ttl pk-yrs)   Types: Cigarettes  Smokeless Tobacco Never  Tobacco Comments   12/29/14 1/2 PPD    Review of Systems  Constitutional: Negative.   HENT: Negative.    Eyes: Negative.   Respiratory: Negative.    Cardiovascular: Negative.   Gastrointestinal: Negative.   Genitourinary: Negative.   Musculoskeletal:  Positive for back pain and joint pain.  Skin: Negative.   Neurological: Negative.   Endo/Heme/Allergies: Negative.   Psychiatric/Behavioral: Negative.      Objective   Vitals:   06/01/23 0949  BP: 102/65  Pulse: 60  Resp: 14  Temp: 98.1 F (36.7 C)  SpO2: 90%    Physical Exam Vitals reviewed.  Constitutional:      Appearance: Normal appearance. She is not ill-appearing.     Comments: In wheelchair  HENT:     Head: Normocephalic and atraumatic.  Cardiovascular:     Rate and Rhythm: Normal rate and regular rhythm.     Heart sounds: Normal heart sounds. No murmur heard.    No friction rub. No gallop.  Pulmonary:     Effort: Pulmonary effort is normal. No respiratory distress.     Breath sounds: Normal breath sounds. No stridor. No wheezing, rhonchi or rales.  Skin:    General: Skin is warm and dry.     Comments: Superficial bilateral buttock skin excoriation noted.  No induration or purulent drainage present.  No erythema present.  Neurological:     Mental Status: She is alert and oriented to person, place, and time.     Media Information  Document Information  Photos    06/01/2023 09:52  Attached To:  Office Visit on 06/01/23 with Franky Macho, MD  Source Information  Franky Macho, MD  [facility]   Document History     Assessment  Superficial pressure ulcerations of skin, healing Plan  Patient should keep wound clean with soap and water and apply the DuoDERM that they have already received.  They may use the hydrocortisone cream that has been prescribed in the past as needed.  No need for surgical intervention at this time.  They understand and agree.  Follow-up here as needed.

## 2023-06-02 MED ORDER — CIPROFLOXACIN HCL 500 MG PO TABS: 500.0000 mg | ORAL_TABLET | Freq: Two times a day (BID) | ORAL | 0 refills | Status: DC

## 2023-06-02 NOTE — Telephone Encounter (Signed)
Verbal from MD to send in 1 cipro post cystoscopy.  Rx sent and patient notified.

## 2023-06-04 DIAGNOSIS — R5381 Other malaise: Secondary | ICD-10-CM | POA: Diagnosis not present

## 2023-06-04 DIAGNOSIS — G319 Degenerative disease of nervous system, unspecified: Secondary | ICD-10-CM | POA: Diagnosis not present

## 2023-06-05 ENCOUNTER — Ambulatory Visit: Payer: 59 | Admitting: Podiatry

## 2023-06-05 DIAGNOSIS — E46 Unspecified protein-calorie malnutrition: Secondary | ICD-10-CM | POA: Diagnosis not present

## 2023-06-05 DIAGNOSIS — Z9181 History of falling: Secondary | ICD-10-CM | POA: Diagnosis not present

## 2023-06-05 DIAGNOSIS — I129 Hypertensive chronic kidney disease with stage 1 through stage 4 chronic kidney disease, or unspecified chronic kidney disease: Secondary | ICD-10-CM | POA: Diagnosis not present

## 2023-06-05 DIAGNOSIS — K5900 Constipation, unspecified: Secondary | ICD-10-CM | POA: Diagnosis not present

## 2023-06-05 DIAGNOSIS — G319 Degenerative disease of nervous system, unspecified: Secondary | ICD-10-CM | POA: Diagnosis not present

## 2023-06-05 DIAGNOSIS — J9611 Chronic respiratory failure with hypoxia: Secondary | ICD-10-CM | POA: Diagnosis not present

## 2023-06-05 DIAGNOSIS — E1165 Type 2 diabetes mellitus with hyperglycemia: Secondary | ICD-10-CM | POA: Diagnosis not present

## 2023-06-05 DIAGNOSIS — N390 Urinary tract infection, site not specified: Secondary | ICD-10-CM | POA: Diagnosis not present

## 2023-06-05 DIAGNOSIS — Z7982 Long term (current) use of aspirin: Secondary | ICD-10-CM | POA: Diagnosis not present

## 2023-06-05 DIAGNOSIS — K5732 Diverticulitis of large intestine without perforation or abscess without bleeding: Secondary | ICD-10-CM | POA: Diagnosis not present

## 2023-06-05 DIAGNOSIS — I700 Atherosclerosis of aorta: Secondary | ICD-10-CM | POA: Diagnosis not present

## 2023-06-05 DIAGNOSIS — N183 Chronic kidney disease, stage 3 unspecified: Secondary | ICD-10-CM | POA: Diagnosis not present

## 2023-06-05 DIAGNOSIS — Z556 Problems related to health literacy: Secondary | ICD-10-CM | POA: Diagnosis not present

## 2023-06-05 DIAGNOSIS — M159 Polyosteoarthritis, unspecified: Secondary | ICD-10-CM | POA: Diagnosis not present

## 2023-06-05 DIAGNOSIS — Z8616 Personal history of COVID-19: Secondary | ICD-10-CM | POA: Diagnosis not present

## 2023-06-05 DIAGNOSIS — J449 Chronic obstructive pulmonary disease, unspecified: Secondary | ICD-10-CM | POA: Diagnosis not present

## 2023-06-05 DIAGNOSIS — M810 Age-related osteoporosis without current pathological fracture: Secondary | ICD-10-CM | POA: Diagnosis not present

## 2023-06-05 DIAGNOSIS — G894 Chronic pain syndrome: Secondary | ICD-10-CM | POA: Diagnosis not present

## 2023-06-05 DIAGNOSIS — E039 Hypothyroidism, unspecified: Secondary | ICD-10-CM | POA: Diagnosis not present

## 2023-06-05 DIAGNOSIS — R296 Repeated falls: Secondary | ICD-10-CM | POA: Diagnosis not present

## 2023-06-05 DIAGNOSIS — F17210 Nicotine dependence, cigarettes, uncomplicated: Secondary | ICD-10-CM | POA: Diagnosis not present

## 2023-06-05 DIAGNOSIS — Z87440 Personal history of urinary (tract) infections: Secondary | ICD-10-CM | POA: Diagnosis not present

## 2023-06-05 DIAGNOSIS — E1122 Type 2 diabetes mellitus with diabetic chronic kidney disease: Secondary | ICD-10-CM | POA: Diagnosis not present

## 2023-06-05 DIAGNOSIS — Z9981 Dependence on supplemental oxygen: Secondary | ICD-10-CM | POA: Diagnosis not present

## 2023-06-06 ENCOUNTER — Encounter: Payer: Self-pay | Admitting: Urology

## 2023-06-06 NOTE — Patient Instructions (Signed)
Transurethral Resection of Bladder Tumor  Transurethral resection of a bladder tumor is the removal (resection) of cancerous tissue (tumor) from the inside wall of the bladder. The bladder is the organ that holds urine. The tumor is removed through the tube that carries urine out of the body (urethra). In a transurethral resection, a thin telescope with a light, a tiny camera, and an electric cutting edge (resectoscope) is passed through the urethra. In men, the opening of the urethra is at the end of the penis. In women, it is just above the opening of the vagina. Tell a health care provider about: Any allergies you have. All medicines you are taking, including vitamins, herbs, eye drops, creams, and over-the-counter medicines. Any problems you or family members have had with anesthetic medicines. Any bleeding problems you have. Any surgeries you have had. Any medical conditions you have, including recent urinary tract infections. Whether you are pregnant or may be pregnant. What are the risks? Generally, this is a safe procedure. However, problems may occur, including: Infection. Bleeding. Allergic reactions to medicines. Damage to nearby structures or organs. Difficulty urinating from blockage of the urethra or not being able to urinate (urinary retention). Deep vein thrombosis. This is a blood clot that can develop in your leg. Recurring cancer. What happens before the procedure? When to stop eating and drinking Follow instructions from your health care provider about what you may eat and drink before your procedure. These may include: 8 hours before your procedure Stop eating most foods. Do not eat meat, fried foods, or fatty foods. Eat only light foods, such as toast or crackers. All liquids are okay except energy drinks and alcohol. 6 hours before your procedure Stop eating. Drink only clear liquids, such as water, clear fruit juice, black coffee, plain tea, and sports  drinks. Do not drink energy drinks or alcohol. 2 hours before your procedure Stop drinking all liquids. You may be allowed to take medicines with small sips of water. Medicines Ask your health care provider about: Changing or stopping your regular medicines. This is especially important if you are taking diabetes medicines or blood thinners. Taking medicines such as aspirin and ibuprofen. These medicines can thin your blood. Do not take these medicines unless your health care provider tells you to take them. Taking over-the-counter medicines, vitamins, herbs, and supplements. General instructions If you will be going home right after the procedure, plan to have a responsible adult: Take you home from the hospital or clinic. You will not be allowed to drive. Care for you for the time you are told. Ask your health care provider what steps will be taken to help prevent infection. These steps may include: Washing skin with a germ-killing soap. Taking antibiotic medicine. Do not use any products that contain nicotine or tobacco for at least 4 weeks before the procedure. These products include cigarettes, chewing tobacco, and vaping devices, such as e-cigarettes. If you need help quitting, ask your health care provider. What happens during the procedure? An IV will be inserted into one of your veins. You will be given one or more of the following: A medicine to help you relax (sedative). A medicine that is injected into your spine to numb the area below and slightly above the injection site (spinal anesthetic). A medicine that is injected into an area of your body to numb everything below the injection site (regional anesthetic). A medicine to make you fall asleep (general anesthetic). Your legs will be placed  in foot rests (stirrups) to open your legs and bend your knees. The resectoscope will be passed through your urethra and into your bladder. The part of your bladder with the tumor will be  resected by the cutting edge of the resectoscope. Fluid will be passed to rinse out the cut tissues (irrigation). The resectoscope will then be taken out. A small, thin tube (catheter) will be passed through your urethra and into your bladder. The catheter will drain urine into a bag outside of your body. The procedure may vary among health care providers and hospitals. What happens after the procedure? Your blood pressure, heart rate, breathing rate, and blood oxygen level will be monitored until you leave the hospital or clinic. You may continue to receive fluids and medicines through an IV. You will be given pain medicine to relieve pain. You will have a catheter to drain your urine. The amount of urine will be measured. If you have blood in your urine, your bladder may be rinsed out by passing fluid through your catheter. You will be encouraged to walk as soon as you can. You may have to wear compression stockings. These stockings help to prevent blood clots and reduce swelling in your legs. If you were given a sedative during the procedure, it can affect you for several hours. Do not drive or operate machinery until your health care provider says that it is safe. Summary Transurethral resection of a bladder tumor is the removal (resection) of a cancerous growth (tumor) on the inside wall of the bladder. To do this procedure, your health care provider uses a thin telescope with a light, a tiny camera, and an electric cutting edge (resectoscope) that is guided to your bladder through your urethra. The part of your bladder that is affected by the tumor will be resected by the cutting edge of the resectoscope. A catheter will be passed through your urethra and into your bladder. The catheter will drain urine into a bag outside of your body. If you will be going home right after the procedure, plan to have a responsible adult take you home from the hospital or clinic. You will not be allowed to  drive. This information is not intended to replace advice given to you by your health care provider. Make sure you discuss any questions you have with your health care provider. Document Revised: 07/09/2021 Document Reviewed: 07/09/2021 Elsevier Patient Education  2024 [employer].

## 2023-06-07 ENCOUNTER — Telehealth: Payer: Self-pay | Admitting: Urology

## 2023-06-07 ENCOUNTER — Other Ambulatory Visit: Payer: Self-pay

## 2023-06-07 DIAGNOSIS — E1122 Type 2 diabetes mellitus with diabetic chronic kidney disease: Secondary | ICD-10-CM | POA: Diagnosis not present

## 2023-06-07 DIAGNOSIS — E039 Hypothyroidism, unspecified: Secondary | ICD-10-CM | POA: Diagnosis not present

## 2023-06-07 DIAGNOSIS — G894 Chronic pain syndrome: Secondary | ICD-10-CM | POA: Diagnosis not present

## 2023-06-07 DIAGNOSIS — Z9981 Dependence on supplemental oxygen: Secondary | ICD-10-CM | POA: Diagnosis not present

## 2023-06-07 DIAGNOSIS — Z8616 Personal history of COVID-19: Secondary | ICD-10-CM | POA: Diagnosis not present

## 2023-06-07 DIAGNOSIS — I129 Hypertensive chronic kidney disease with stage 1 through stage 4 chronic kidney disease, or unspecified chronic kidney disease: Secondary | ICD-10-CM | POA: Diagnosis not present

## 2023-06-07 DIAGNOSIS — F17210 Nicotine dependence, cigarettes, uncomplicated: Secondary | ICD-10-CM | POA: Diagnosis not present

## 2023-06-07 DIAGNOSIS — K5732 Diverticulitis of large intestine without perforation or abscess without bleeding: Secondary | ICD-10-CM | POA: Diagnosis not present

## 2023-06-07 DIAGNOSIS — K5900 Constipation, unspecified: Secondary | ICD-10-CM | POA: Diagnosis not present

## 2023-06-07 DIAGNOSIS — N3289 Other specified disorders of bladder: Secondary | ICD-10-CM

## 2023-06-07 DIAGNOSIS — M159 Polyosteoarthritis, unspecified: Secondary | ICD-10-CM | POA: Diagnosis not present

## 2023-06-07 DIAGNOSIS — J9611 Chronic respiratory failure with hypoxia: Secondary | ICD-10-CM | POA: Diagnosis not present

## 2023-06-07 DIAGNOSIS — E46 Unspecified protein-calorie malnutrition: Secondary | ICD-10-CM | POA: Diagnosis not present

## 2023-06-07 DIAGNOSIS — Z7982 Long term (current) use of aspirin: Secondary | ICD-10-CM | POA: Diagnosis not present

## 2023-06-07 DIAGNOSIS — Z87440 Personal history of urinary (tract) infections: Secondary | ICD-10-CM | POA: Diagnosis not present

## 2023-06-07 DIAGNOSIS — M810 Age-related osteoporosis without current pathological fracture: Secondary | ICD-10-CM | POA: Diagnosis not present

## 2023-06-07 DIAGNOSIS — J449 Chronic obstructive pulmonary disease, unspecified: Secondary | ICD-10-CM | POA: Diagnosis not present

## 2023-06-07 DIAGNOSIS — R296 Repeated falls: Secondary | ICD-10-CM | POA: Diagnosis not present

## 2023-06-07 DIAGNOSIS — E1165 Type 2 diabetes mellitus with hyperglycemia: Secondary | ICD-10-CM | POA: Diagnosis not present

## 2023-06-07 DIAGNOSIS — G319 Degenerative disease of nervous system, unspecified: Secondary | ICD-10-CM | POA: Diagnosis not present

## 2023-06-07 DIAGNOSIS — Z9181 History of falling: Secondary | ICD-10-CM | POA: Diagnosis not present

## 2023-06-07 DIAGNOSIS — N390 Urinary tract infection, site not specified: Secondary | ICD-10-CM | POA: Diagnosis not present

## 2023-06-07 DIAGNOSIS — N183 Chronic kidney disease, stage 3 unspecified: Secondary | ICD-10-CM | POA: Diagnosis not present

## 2023-06-07 DIAGNOSIS — I700 Atherosclerosis of aorta: Secondary | ICD-10-CM | POA: Diagnosis not present

## 2023-06-07 DIAGNOSIS — Z556 Problems related to health literacy: Secondary | ICD-10-CM | POA: Diagnosis not present

## 2023-06-07 NOTE — Telephone Encounter (Signed)
Daughter [name] called and wants results from biopsy. She has waited a week and has looked on mychart. She would like a call back today.

## 2023-06-07 NOTE — Telephone Encounter (Signed)
Daughter thought cytology was sent at last cysto procedure.  I informed her that it was not sent because we could not collect enough sample to send out.  She wanted to know if MD wanted a voided specimen for cytology and offered to bring sample by office.  Verbal from MD, ok to bring specimen for cytology.  Orders in, sterile cup left at front desk for daughter to pick up and daughter notified of MD response.

## 2023-06-14 DIAGNOSIS — R5381 Other malaise: Secondary | ICD-10-CM | POA: Diagnosis not present

## 2023-06-14 DIAGNOSIS — M159 Polyosteoarthritis, unspecified: Secondary | ICD-10-CM | POA: Diagnosis not present

## 2023-06-14 DIAGNOSIS — J9611 Chronic respiratory failure with hypoxia: Secondary | ICD-10-CM | POA: Diagnosis not present

## 2023-06-14 DIAGNOSIS — G319 Degenerative disease of nervous system, unspecified: Secondary | ICD-10-CM | POA: Diagnosis not present

## 2023-06-14 DIAGNOSIS — I129 Hypertensive chronic kidney disease with stage 1 through stage 4 chronic kidney disease, or unspecified chronic kidney disease: Secondary | ICD-10-CM | POA: Diagnosis not present

## 2023-06-14 DIAGNOSIS — E1165 Type 2 diabetes mellitus with hyperglycemia: Secondary | ICD-10-CM | POA: Diagnosis not present

## 2023-06-14 DIAGNOSIS — G894 Chronic pain syndrome: Secondary | ICD-10-CM | POA: Diagnosis not present

## 2023-06-14 DIAGNOSIS — K5901 Slow transit constipation: Secondary | ICD-10-CM | POA: Diagnosis not present

## 2023-06-14 DIAGNOSIS — M810 Age-related osteoporosis without current pathological fracture: Secondary | ICD-10-CM | POA: Diagnosis not present

## 2023-06-14 DIAGNOSIS — N3289 Other specified disorders of bladder: Secondary | ICD-10-CM | POA: Diagnosis not present

## 2023-06-14 DIAGNOSIS — E1122 Type 2 diabetes mellitus with diabetic chronic kidney disease: Secondary | ICD-10-CM | POA: Diagnosis not present

## 2023-06-16 DIAGNOSIS — M159 Polyosteoarthritis, unspecified: Secondary | ICD-10-CM | POA: Diagnosis not present

## 2023-06-16 DIAGNOSIS — J449 Chronic obstructive pulmonary disease, unspecified: Secondary | ICD-10-CM | POA: Diagnosis not present

## 2023-06-16 DIAGNOSIS — F17210 Nicotine dependence, cigarettes, uncomplicated: Secondary | ICD-10-CM | POA: Diagnosis not present

## 2023-06-16 DIAGNOSIS — Z9181 History of falling: Secondary | ICD-10-CM | POA: Diagnosis not present

## 2023-06-16 DIAGNOSIS — K5900 Constipation, unspecified: Secondary | ICD-10-CM | POA: Diagnosis not present

## 2023-06-16 DIAGNOSIS — Z9981 Dependence on supplemental oxygen: Secondary | ICD-10-CM | POA: Diagnosis not present

## 2023-06-16 DIAGNOSIS — I700 Atherosclerosis of aorta: Secondary | ICD-10-CM | POA: Diagnosis not present

## 2023-06-16 DIAGNOSIS — G319 Degenerative disease of nervous system, unspecified: Secondary | ICD-10-CM | POA: Diagnosis not present

## 2023-06-16 DIAGNOSIS — E46 Unspecified protein-calorie malnutrition: Secondary | ICD-10-CM | POA: Diagnosis not present

## 2023-06-16 DIAGNOSIS — I129 Hypertensive chronic kidney disease with stage 1 through stage 4 chronic kidney disease, or unspecified chronic kidney disease: Secondary | ICD-10-CM | POA: Diagnosis not present

## 2023-06-16 DIAGNOSIS — M810 Age-related osteoporosis without current pathological fracture: Secondary | ICD-10-CM | POA: Diagnosis not present

## 2023-06-16 DIAGNOSIS — E1165 Type 2 diabetes mellitus with hyperglycemia: Secondary | ICD-10-CM | POA: Diagnosis not present

## 2023-06-16 DIAGNOSIS — G894 Chronic pain syndrome: Secondary | ICD-10-CM | POA: Diagnosis not present

## 2023-06-16 DIAGNOSIS — J9611 Chronic respiratory failure with hypoxia: Secondary | ICD-10-CM | POA: Diagnosis not present

## 2023-06-16 DIAGNOSIS — E1122 Type 2 diabetes mellitus with diabetic chronic kidney disease: Secondary | ICD-10-CM | POA: Diagnosis not present

## 2023-06-16 DIAGNOSIS — Z556 Problems related to health literacy: Secondary | ICD-10-CM | POA: Diagnosis not present

## 2023-06-16 DIAGNOSIS — Z8616 Personal history of COVID-19: Secondary | ICD-10-CM | POA: Diagnosis not present

## 2023-06-16 DIAGNOSIS — K5732 Diverticulitis of large intestine without perforation or abscess without bleeding: Secondary | ICD-10-CM | POA: Diagnosis not present

## 2023-06-16 DIAGNOSIS — R296 Repeated falls: Secondary | ICD-10-CM | POA: Diagnosis not present

## 2023-06-16 DIAGNOSIS — E039 Hypothyroidism, unspecified: Secondary | ICD-10-CM | POA: Diagnosis not present

## 2023-06-16 DIAGNOSIS — N390 Urinary tract infection, site not specified: Secondary | ICD-10-CM | POA: Diagnosis not present

## 2023-06-16 DIAGNOSIS — N183 Chronic kidney disease, stage 3 unspecified: Secondary | ICD-10-CM | POA: Diagnosis not present

## 2023-06-16 DIAGNOSIS — Z7982 Long term (current) use of aspirin: Secondary | ICD-10-CM | POA: Diagnosis not present

## 2023-06-16 DIAGNOSIS — Z87440 Personal history of urinary (tract) infections: Secondary | ICD-10-CM | POA: Diagnosis not present

## 2023-06-21 ENCOUNTER — Ambulatory Visit: Payer: 59 | Admitting: Urology

## 2023-06-22 ENCOUNTER — Ambulatory Visit: Payer: 59 | Admitting: Internal Medicine

## 2023-06-22 ENCOUNTER — Encounter: Payer: Self-pay | Admitting: Internal Medicine

## 2023-06-22 VITALS — BP 112/67 | HR 65 | Ht <= 58 in

## 2023-06-22 DIAGNOSIS — R296 Repeated falls: Secondary | ICD-10-CM | POA: Diagnosis not present

## 2023-06-22 DIAGNOSIS — I129 Hypertensive chronic kidney disease with stage 1 through stage 4 chronic kidney disease, or unspecified chronic kidney disease: Secondary | ICD-10-CM | POA: Diagnosis not present

## 2023-06-22 DIAGNOSIS — E1165 Type 2 diabetes mellitus with hyperglycemia: Secondary | ICD-10-CM | POA: Diagnosis not present

## 2023-06-22 DIAGNOSIS — M159 Polyosteoarthritis, unspecified: Secondary | ICD-10-CM | POA: Diagnosis not present

## 2023-06-22 DIAGNOSIS — N183 Chronic kidney disease, stage 3 unspecified: Secondary | ICD-10-CM | POA: Diagnosis not present

## 2023-06-22 DIAGNOSIS — Z8616 Personal history of COVID-19: Secondary | ICD-10-CM | POA: Diagnosis not present

## 2023-06-22 DIAGNOSIS — K5732 Diverticulitis of large intestine without perforation or abscess without bleeding: Secondary | ICD-10-CM | POA: Diagnosis not present

## 2023-06-22 DIAGNOSIS — E46 Unspecified protein-calorie malnutrition: Secondary | ICD-10-CM | POA: Diagnosis not present

## 2023-06-22 DIAGNOSIS — E039 Hypothyroidism, unspecified: Secondary | ICD-10-CM | POA: Diagnosis not present

## 2023-06-22 DIAGNOSIS — Z87440 Personal history of urinary (tract) infections: Secondary | ICD-10-CM | POA: Diagnosis not present

## 2023-06-22 DIAGNOSIS — Z556 Problems related to health literacy: Secondary | ICD-10-CM | POA: Diagnosis not present

## 2023-06-22 DIAGNOSIS — J9611 Chronic respiratory failure with hypoxia: Secondary | ICD-10-CM

## 2023-06-22 DIAGNOSIS — F17210 Nicotine dependence, cigarettes, uncomplicated: Secondary | ICD-10-CM

## 2023-06-22 DIAGNOSIS — J449 Chronic obstructive pulmonary disease, unspecified: Secondary | ICD-10-CM | POA: Diagnosis not present

## 2023-06-22 DIAGNOSIS — G894 Chronic pain syndrome: Secondary | ICD-10-CM | POA: Diagnosis not present

## 2023-06-22 DIAGNOSIS — Z7982 Long term (current) use of aspirin: Secondary | ICD-10-CM | POA: Diagnosis not present

## 2023-06-22 DIAGNOSIS — M810 Age-related osteoporosis without current pathological fracture: Secondary | ICD-10-CM | POA: Diagnosis not present

## 2023-06-22 DIAGNOSIS — Z9181 History of falling: Secondary | ICD-10-CM | POA: Diagnosis not present

## 2023-06-22 DIAGNOSIS — Z9981 Dependence on supplemental oxygen: Secondary | ICD-10-CM | POA: Diagnosis not present

## 2023-06-22 DIAGNOSIS — N390 Urinary tract infection, site not specified: Secondary | ICD-10-CM | POA: Diagnosis not present

## 2023-06-22 DIAGNOSIS — G319 Degenerative disease of nervous system, unspecified: Secondary | ICD-10-CM | POA: Diagnosis not present

## 2023-06-22 DIAGNOSIS — E063 Autoimmune thyroiditis: Secondary | ICD-10-CM | POA: Diagnosis not present

## 2023-06-22 DIAGNOSIS — E038 Other specified hypothyroidism: Secondary | ICD-10-CM | POA: Diagnosis not present

## 2023-06-22 DIAGNOSIS — E1122 Type 2 diabetes mellitus with diabetic chronic kidney disease: Secondary | ICD-10-CM | POA: Diagnosis not present

## 2023-06-22 DIAGNOSIS — K5900 Constipation, unspecified: Secondary | ICD-10-CM | POA: Diagnosis not present

## 2023-06-22 DIAGNOSIS — I700 Atherosclerosis of aorta: Secondary | ICD-10-CM | POA: Diagnosis not present

## 2023-06-22 NOTE — Assessment & Plan Note (Signed)
4-5 min discussion re active cigarette smoking in addition to office E&M  Ask about tobacco use:   ongoing Advise quitting   matter of life or breath reviewed - fm has decided to let her smoke as a matter of quality of life though clearly this is problematic as long term qol will undoubtedly decline as result although granted she'll get some short term comfort from smoking  Assess willingness:  Not committed at this point Assist in quit attempt:  Per PCP when ready Arrange follow up:   Follow up per Primary Care planned

## 2023-06-22 NOTE — Patient Instructions (Signed)
You are cleared for surgery for your bladder   No change in medications  The key is to stop smoking completely before smoking completely stops you!   Please schedule a follow up visit in 12  months but call sooner if needed

## 2023-06-22 NOTE — Progress Notes (Signed)
Amanda Armstrong, female    DOB: 1945/10/25,   MRN: [id number]   Brief patient profile:  73 yowf active smoker referred to pulmonary clinic in [place]  05/21/2021 by Amanda Armstrong  with baseline able to get around the house on 4 pronged walker limited by balance / strength s/p cva's     History of Present Illness  05/21/2021  Pulmonary/ 1st Armstrong eval/ Amanda Armstrong / Amanda Armstrong Armstrong  Chief Complaint  Patient presents with   Consult    Pt saw Amanda Armstrong. Dx with COPD. Has not seen pulmonary Amanda since. COVID January 2022 since shes been on oxygen. 1.5L O2  cont.    Dyspnea:  PT coming out once a week / room to room limited / wearing 02 after ex Cough: congested cough / swallows mucus mostly  but what she does expectorate is clear Amanda Armstrong is am  Sleep: bed is flat with 2 pillows ok at night  SABA use: just anoro 02: 1.5 hs  and "all day " Rec Anoro one click each am  - two good drags 02  1.5 lpm at bedtime and during the day the goal is to keep the 0xygen saturation level above 90% at all times - you may need more 02 when you walk and a lot less when you sit to achieve that level. The key is to stop smoking completely before smoking completely stops you! Please schedule a follow up visit in 12 months but call sooner if needed     05/24/2022  f/u ov/Amanda Armstrong/Amanda Armstrong re: GOLD ? /group B copd  maint on Anoro  / actively smokes Chief Complaint  Patient presents with   Follow-up    Breathing doing well   Dyspnea:  limited by balance/strength >  sob  Cough: none now Sleeping: flat bed, lots of pillows  SABA use: doesn't have one (says never did )  02: 1.5 hs and prn daytime  Covid status: vax all but newest  Rec The key is to stop smoking completely before smoking completely stops you! No change in medications    06/22/2023  f/u ov/Amanda Armstrong/Amanda Armstrong re: GOLD ? Group E maint on trelegy  still smoker  Chief Complaint  Patient presents with   COPD   Shortness of Breath    Dyspnea:  balance and strength  Cough: smoker's rattle worse in am/ nothing purulent  Sleeping: hosp bed about    resp cc  SABA use: 30 degrees  02: 1.5 h hs and prn    No obvious day to day or daytime variability or assoc  purulent sputum or mucus plugs or hemoptysis or cp or chest tightness, subjective wheeze or overt sinus or hb symptoms.    Also denies any obvious fluctuation of symptoms with weather or environmental changes or other aggravating or alleviating factors except as outlined above   No unusual exposure hx or h/o childhood pna/ asthma or knowledge of premature birth.  Current Allergies, Complete Past Medical History, Past Surgical History, Family History, and Social History were reviewed in Amanda Armstrong record.  ROS  The following are not active complaints unless bolded Hoarseness, sore throat, dysphagia, dental problems, itching, sneezing,  nasal congestion or discharge of excess mucus or purulent secretions, ear ache,   fever, chills, sweats, unintended wt loss or wt gain, classically pleuritic or exertional cp,  orthopnea pnd or arm/hand swelling  or leg swelling, presyncope, palpitations, abdominal pain, anorexia, nausea, vomiting, diarrhea  or change in bowel  habits or change in bladder habits, change in stools or change in urine, dysuria, hematuria,  rash, arthralgias, visual complaints, headache, numbness, weakness or ataxia or problems with walking or coordination,  change in mood or  memory.        Current Meds  Medication Sig   ACCU-CHEK GUIDE test strip USE TEST STRIP TO CHECK GLUCOSE TWICE DAILY BEFORE BREAKFAST AND  BEFORE BEDTIME   Accu-Chek Softclix Lancets lancets USE  TO CHECK GLUCOSE TWICE DAILY   acetaminophen (TYLENOL) 500 MG tablet Take 1,000 mg by mouth every 6 (six) hours as needed for mild pain.   alendronate (FOSAMAX) 70 MG tablet Take 70 mg by mouth once a week.   ALPRAZolam (XANAX) 1 MG tablet Take 1 mg by mouth at bedtime.    aspirin 81 MG EC tablet Take 81 mg by mouth in the morning.   atorvastatin (LIPITOR) 20 MG tablet Take 20 mg by mouth daily at 12 noon.   blood glucose meter kit and supplies Dispense based on patient and insurance preference. Use up to four times daily as directed. (FOR ICD-10 E10.9, E11.9).   cephALEXin (KEFLEX) 250 MG capsule Take 250 mg by mouth every 6 (six) hours.   Cholecalciferol (VITAMIN D3) 50 MCG (2000 UT) capsule Take 2,000 Units by mouth daily.   ciprofloxacin (CIPRO) 500 MG tablet Take 1 tablet (500 mg total) by mouth every 12 (twelve) hours. Take 1 tablet after cystoscopy procedure in [place].   donepezil (ARICEPT) 5 MG tablet Take 5 mg by mouth at bedtime.   DULoxetine (CYMBALTA) 30 MG capsule Take 30 mg by mouth daily.   furosemide (LASIX) 20 MG tablet Take 20 mg by mouth daily.   HYDROcodone-acetaminophen (NORCO) 10-325 MG tablet Take 1 tablet by mouth every 4 (four) hours as needed for moderate pain.   hydrOXYzine (ATARAX) 25 MG tablet Take 25 mg by mouth every 6 (six) hours as needed.   Insulin Pen Needle (PEN NEEDLES 3/16") 31G X 5 MM MISC 100 each by Does not apply route 2 (two) times daily.   levothyroxine (SYNTHROID) 75 MCG tablet Take 1 tablet (75 mcg total) by mouth daily before breakfast.   OXYGEN Inhale 1 L/min into the lungs daily.   polyethylene glycol (MIRALAX) 17 g packet Take 17 g by mouth daily.   RELION PEN NEEDLES 31G X 6 MM MISC Use to inject insulin once daily.   SANTYL 250 UNIT/GM ointment Apply 1 Application topically daily.   sulfamethoxazole-trimethoprim (BACTRIM) 400-80 MG tablet Take 1 tablet by mouth daily.   TRELEGY ELLIPTA 200-62.5-25 MCG/ACT AEPB Inhale 1 puff into the lungs daily.              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    pt stated that she has intermittent sx   Clostridium difficile infection     Daytime somnolence    Frequent falls    Headache(784.0)    SINUS HEADACHE OCCASSIONALLY   Hypertension    Hypothyroidism    Multifactorial gait disorder    Osteoporosis    Right foot drop    Snoring    Stroke (HCC)    Tobacco use    Vertigo    Weakness       Objective:    wts   06/22/2023  did not weigh   05/24/22 163 lb (73.9 kg)  03/25/21 163 lb 12.8 oz (74.3 kg)  08/31/20 151 lb 9.6 oz (68.8 kg)       Vital signs reviewed  06/22/2023  - Note at rest 02 sats  92% on RA   General appearance:    w/c bound elderly wf mild expressive aphasia  HEENT : Oropharynx  clear      NECK :  without  apparent JVD/ palpable Nodes/TM    LUNGS: no acc muscle use,  Mild barrel  contour chest wall with bilateral  Distant bs s audible wheeze and  without cough on insp or exp maneuvers  and mild  Hyperresonant  to  percussion bilaterally     CV:  RRR  no s3 or murmur or increase in P2, and no edema   ABD:  mod distended but soft and nontender    MS:  Nl gait/ ext warm without deformities Or obvious joint restrictions  calf tenderness, cyanosis or clubbing     SKIN: warm and dry without lesions    NEURO:  alert, approp    I personally reviewed images and agree with radiology impression as follows:   Chest CT10/13/24    w contrast Mild peripheral interlobular septal thickening present bilaterally. A 3 mm pulmonary nodule is present in the peripheral right upper lobe on image 44 of series 3. A 4 mm pleural based nodule is present posteriorly in the right lower lobe on image 66. A second 4 mm pulmonary nodule is present posteriorly in the right lower lobe on image 74. No discrete left-sided nodules are present   My review: very mild emphysema is present     Assessment

## 2023-06-22 NOTE — Assessment & Plan Note (Signed)
Active smoker - 05/21/2021  After extensive coaching inhaler device,  effectiveness =    75% with dpi    Group D (now reclassified as E) in terms of symptom/risk and laba/lama/ICS  therefore appropriate rx at this point >>>  trelelgy and approp saba

## 2023-06-22 NOTE — Assessment & Plan Note (Addendum)
Placed on 19 Jul 2020 p covid  - sats RA at rst = 96% 05/21/2021 so rec titrate > 90% with ex   Advised: Make sure you check your oxygen saturation  AT  your highest level of activity (not after you stop)   to be sure it stays over 90% and adjust  02 flow upward to maintain this level if needed but remember to turn it back to previous settings when you stop (to conserve your supply).

## 2023-06-23 LAB — T4, FREE: Free T4: 1.33 ng/dL (ref 0.82–1.77)

## 2023-06-23 LAB — TSH: TSH: 10.4 u[IU]/mL — ABNORMAL HIGH (ref 0.450–4.500)

## 2023-06-25 DIAGNOSIS — L89152 Pressure ulcer of sacral region, stage 2: Secondary | ICD-10-CM | POA: Diagnosis not present

## 2023-06-25 DIAGNOSIS — L89312 Pressure ulcer of right buttock, stage 2: Secondary | ICD-10-CM | POA: Diagnosis not present

## 2023-06-26 ENCOUNTER — Telehealth: Payer: Self-pay | Admitting: Nurse Practitioner

## 2023-06-26 NOTE — Telephone Encounter (Signed)
Pts daughter canceled appt for 06/29/2023 and understands that we are booking out into March and labs will have to be redone.  Pts daughter stated she would call back to reschedule

## 2023-06-29 ENCOUNTER — Ambulatory Visit: Payer: 59 | Admitting: Nurse Practitioner

## 2023-06-29 DIAGNOSIS — Z7982 Long term (current) use of aspirin: Secondary | ICD-10-CM | POA: Diagnosis not present

## 2023-06-29 DIAGNOSIS — G894 Chronic pain syndrome: Secondary | ICD-10-CM | POA: Diagnosis not present

## 2023-06-29 DIAGNOSIS — E039 Hypothyroidism, unspecified: Secondary | ICD-10-CM | POA: Diagnosis not present

## 2023-06-29 DIAGNOSIS — F17210 Nicotine dependence, cigarettes, uncomplicated: Secondary | ICD-10-CM | POA: Diagnosis not present

## 2023-06-29 DIAGNOSIS — Z9981 Dependence on supplemental oxygen: Secondary | ICD-10-CM | POA: Diagnosis not present

## 2023-06-29 DIAGNOSIS — G319 Degenerative disease of nervous system, unspecified: Secondary | ICD-10-CM | POA: Diagnosis not present

## 2023-06-29 DIAGNOSIS — J449 Chronic obstructive pulmonary disease, unspecified: Secondary | ICD-10-CM | POA: Diagnosis not present

## 2023-06-29 DIAGNOSIS — J9611 Chronic respiratory failure with hypoxia: Secondary | ICD-10-CM | POA: Diagnosis not present

## 2023-06-29 DIAGNOSIS — N183 Chronic kidney disease, stage 3 unspecified: Secondary | ICD-10-CM | POA: Diagnosis not present

## 2023-06-29 DIAGNOSIS — Z87440 Personal history of urinary (tract) infections: Secondary | ICD-10-CM | POA: Diagnosis not present

## 2023-06-29 DIAGNOSIS — K5732 Diverticulitis of large intestine without perforation or abscess without bleeding: Secondary | ICD-10-CM | POA: Diagnosis not present

## 2023-06-29 DIAGNOSIS — I129 Hypertensive chronic kidney disease with stage 1 through stage 4 chronic kidney disease, or unspecified chronic kidney disease: Secondary | ICD-10-CM | POA: Diagnosis not present

## 2023-06-29 DIAGNOSIS — Z9181 History of falling: Secondary | ICD-10-CM | POA: Diagnosis not present

## 2023-06-29 DIAGNOSIS — M810 Age-related osteoporosis without current pathological fracture: Secondary | ICD-10-CM | POA: Diagnosis not present

## 2023-06-29 DIAGNOSIS — E1122 Type 2 diabetes mellitus with diabetic chronic kidney disease: Secondary | ICD-10-CM | POA: Diagnosis not present

## 2023-06-29 DIAGNOSIS — M159 Polyosteoarthritis, unspecified: Secondary | ICD-10-CM | POA: Diagnosis not present

## 2023-06-29 DIAGNOSIS — R296 Repeated falls: Secondary | ICD-10-CM | POA: Diagnosis not present

## 2023-06-29 DIAGNOSIS — I700 Atherosclerosis of aorta: Secondary | ICD-10-CM | POA: Diagnosis not present

## 2023-06-29 DIAGNOSIS — E46 Unspecified protein-calorie malnutrition: Secondary | ICD-10-CM | POA: Diagnosis not present

## 2023-06-29 DIAGNOSIS — Z8616 Personal history of COVID-19: Secondary | ICD-10-CM | POA: Diagnosis not present

## 2023-06-29 DIAGNOSIS — Z556 Problems related to health literacy: Secondary | ICD-10-CM | POA: Diagnosis not present

## 2023-06-29 DIAGNOSIS — E1165 Type 2 diabetes mellitus with hyperglycemia: Secondary | ICD-10-CM | POA: Diagnosis not present

## 2023-06-29 DIAGNOSIS — N390 Urinary tract infection, site not specified: Secondary | ICD-10-CM | POA: Diagnosis not present

## 2023-06-29 DIAGNOSIS — K5900 Constipation, unspecified: Secondary | ICD-10-CM | POA: Diagnosis not present

## 2023-07-01 DIAGNOSIS — J9601 Acute respiratory failure with hypoxia: Secondary | ICD-10-CM | POA: Diagnosis not present

## 2023-07-01 DIAGNOSIS — U071 COVID-19: Secondary | ICD-10-CM | POA: Diagnosis not present

## 2023-07-01 DIAGNOSIS — J449 Chronic obstructive pulmonary disease, unspecified: Secondary | ICD-10-CM | POA: Diagnosis not present

## 2023-07-04 DIAGNOSIS — Z8616 Personal history of COVID-19: Secondary | ICD-10-CM | POA: Diagnosis not present

## 2023-07-04 DIAGNOSIS — E1122 Type 2 diabetes mellitus with diabetic chronic kidney disease: Secondary | ICD-10-CM | POA: Diagnosis not present

## 2023-07-04 DIAGNOSIS — I129 Hypertensive chronic kidney disease with stage 1 through stage 4 chronic kidney disease, or unspecified chronic kidney disease: Secondary | ICD-10-CM | POA: Diagnosis not present

## 2023-07-04 DIAGNOSIS — F17210 Nicotine dependence, cigarettes, uncomplicated: Secondary | ICD-10-CM | POA: Diagnosis not present

## 2023-07-04 DIAGNOSIS — J9611 Chronic respiratory failure with hypoxia: Secondary | ICD-10-CM | POA: Diagnosis not present

## 2023-07-04 DIAGNOSIS — Z7982 Long term (current) use of aspirin: Secondary | ICD-10-CM | POA: Diagnosis not present

## 2023-07-04 DIAGNOSIS — R296 Repeated falls: Secondary | ICD-10-CM | POA: Diagnosis not present

## 2023-07-04 DIAGNOSIS — E1165 Type 2 diabetes mellitus with hyperglycemia: Secondary | ICD-10-CM | POA: Diagnosis not present

## 2023-07-04 DIAGNOSIS — Z9181 History of falling: Secondary | ICD-10-CM | POA: Diagnosis not present

## 2023-07-04 DIAGNOSIS — K5900 Constipation, unspecified: Secondary | ICD-10-CM | POA: Diagnosis not present

## 2023-07-04 DIAGNOSIS — N183 Chronic kidney disease, stage 3 unspecified: Secondary | ICD-10-CM | POA: Diagnosis not present

## 2023-07-04 DIAGNOSIS — J449 Chronic obstructive pulmonary disease, unspecified: Secondary | ICD-10-CM | POA: Diagnosis not present

## 2023-07-04 DIAGNOSIS — I700 Atherosclerosis of aorta: Secondary | ICD-10-CM | POA: Diagnosis not present

## 2023-07-04 DIAGNOSIS — G319 Degenerative disease of nervous system, unspecified: Secondary | ICD-10-CM | POA: Diagnosis not present

## 2023-07-04 DIAGNOSIS — E46 Unspecified protein-calorie malnutrition: Secondary | ICD-10-CM | POA: Diagnosis not present

## 2023-07-04 DIAGNOSIS — N390 Urinary tract infection, site not specified: Secondary | ICD-10-CM | POA: Diagnosis not present

## 2023-07-04 DIAGNOSIS — Z87440 Personal history of urinary (tract) infections: Secondary | ICD-10-CM | POA: Diagnosis not present

## 2023-07-04 DIAGNOSIS — M810 Age-related osteoporosis without current pathological fracture: Secondary | ICD-10-CM | POA: Diagnosis not present

## 2023-07-04 DIAGNOSIS — Z556 Problems related to health literacy: Secondary | ICD-10-CM | POA: Diagnosis not present

## 2023-07-04 DIAGNOSIS — M159 Polyosteoarthritis, unspecified: Secondary | ICD-10-CM | POA: Diagnosis not present

## 2023-07-04 DIAGNOSIS — Z9981 Dependence on supplemental oxygen: Secondary | ICD-10-CM | POA: Diagnosis not present

## 2023-07-04 DIAGNOSIS — G894 Chronic pain syndrome: Secondary | ICD-10-CM | POA: Diagnosis not present

## 2023-07-04 DIAGNOSIS — E039 Hypothyroidism, unspecified: Secondary | ICD-10-CM | POA: Diagnosis not present

## 2023-07-04 DIAGNOSIS — K5732 Diverticulitis of large intestine without perforation or abscess without bleeding: Secondary | ICD-10-CM | POA: Diagnosis not present

## 2023-07-10 DIAGNOSIS — Z7982 Long term (current) use of aspirin: Secondary | ICD-10-CM | POA: Diagnosis not present

## 2023-07-10 DIAGNOSIS — E1122 Type 2 diabetes mellitus with diabetic chronic kidney disease: Secondary | ICD-10-CM | POA: Diagnosis not present

## 2023-07-10 DIAGNOSIS — Z556 Problems related to health literacy: Secondary | ICD-10-CM | POA: Diagnosis not present

## 2023-07-10 DIAGNOSIS — I129 Hypertensive chronic kidney disease with stage 1 through stage 4 chronic kidney disease, or unspecified chronic kidney disease: Secondary | ICD-10-CM | POA: Diagnosis not present

## 2023-07-10 DIAGNOSIS — R296 Repeated falls: Secondary | ICD-10-CM | POA: Diagnosis not present

## 2023-07-10 DIAGNOSIS — F17210 Nicotine dependence, cigarettes, uncomplicated: Secondary | ICD-10-CM | POA: Diagnosis not present

## 2023-07-10 DIAGNOSIS — K5732 Diverticulitis of large intestine without perforation or abscess without bleeding: Secondary | ICD-10-CM | POA: Diagnosis not present

## 2023-07-10 DIAGNOSIS — Z9981 Dependence on supplemental oxygen: Secondary | ICD-10-CM | POA: Diagnosis not present

## 2023-07-10 DIAGNOSIS — E039 Hypothyroidism, unspecified: Secondary | ICD-10-CM | POA: Diagnosis not present

## 2023-07-10 DIAGNOSIS — J449 Chronic obstructive pulmonary disease, unspecified: Secondary | ICD-10-CM | POA: Diagnosis not present

## 2023-07-10 DIAGNOSIS — N183 Chronic kidney disease, stage 3 unspecified: Secondary | ICD-10-CM | POA: Diagnosis not present

## 2023-07-10 DIAGNOSIS — I700 Atherosclerosis of aorta: Secondary | ICD-10-CM | POA: Diagnosis not present

## 2023-07-10 DIAGNOSIS — M810 Age-related osteoporosis without current pathological fracture: Secondary | ICD-10-CM | POA: Diagnosis not present

## 2023-07-10 DIAGNOSIS — K5900 Constipation, unspecified: Secondary | ICD-10-CM | POA: Diagnosis not present

## 2023-07-10 DIAGNOSIS — J9611 Chronic respiratory failure with hypoxia: Secondary | ICD-10-CM | POA: Diagnosis not present

## 2023-07-10 DIAGNOSIS — Z87440 Personal history of urinary (tract) infections: Secondary | ICD-10-CM | POA: Diagnosis not present

## 2023-07-10 DIAGNOSIS — Z8616 Personal history of COVID-19: Secondary | ICD-10-CM | POA: Diagnosis not present

## 2023-07-10 DIAGNOSIS — M159 Polyosteoarthritis, unspecified: Secondary | ICD-10-CM | POA: Diagnosis not present

## 2023-07-10 DIAGNOSIS — E46 Unspecified protein-calorie malnutrition: Secondary | ICD-10-CM | POA: Diagnosis not present

## 2023-07-10 DIAGNOSIS — G319 Degenerative disease of nervous system, unspecified: Secondary | ICD-10-CM | POA: Diagnosis not present

## 2023-07-10 DIAGNOSIS — Z7983 Long term (current) use of bisphosphonates: Secondary | ICD-10-CM | POA: Diagnosis not present

## 2023-07-10 DIAGNOSIS — G894 Chronic pain syndrome: Secondary | ICD-10-CM | POA: Diagnosis not present

## 2023-07-14 DIAGNOSIS — M159 Polyosteoarthritis, unspecified: Secondary | ICD-10-CM | POA: Diagnosis not present

## 2023-07-14 DIAGNOSIS — E1165 Type 2 diabetes mellitus with hyperglycemia: Secondary | ICD-10-CM | POA: Diagnosis not present

## 2023-07-14 DIAGNOSIS — J9611 Chronic respiratory failure with hypoxia: Secondary | ICD-10-CM | POA: Diagnosis not present

## 2023-07-14 DIAGNOSIS — G319 Degenerative disease of nervous system, unspecified: Secondary | ICD-10-CM | POA: Diagnosis not present

## 2023-07-17 DIAGNOSIS — G894 Chronic pain syndrome: Secondary | ICD-10-CM | POA: Diagnosis not present

## 2023-07-17 DIAGNOSIS — F17210 Nicotine dependence, cigarettes, uncomplicated: Secondary | ICD-10-CM | POA: Diagnosis not present

## 2023-07-17 DIAGNOSIS — J449 Chronic obstructive pulmonary disease, unspecified: Secondary | ICD-10-CM | POA: Diagnosis not present

## 2023-07-17 DIAGNOSIS — N183 Chronic kidney disease, stage 3 unspecified: Secondary | ICD-10-CM | POA: Diagnosis not present

## 2023-07-17 DIAGNOSIS — E46 Unspecified protein-calorie malnutrition: Secondary | ICD-10-CM | POA: Diagnosis not present

## 2023-07-17 DIAGNOSIS — Z7983 Long term (current) use of bisphosphonates: Secondary | ICD-10-CM | POA: Diagnosis not present

## 2023-07-17 DIAGNOSIS — I129 Hypertensive chronic kidney disease with stage 1 through stage 4 chronic kidney disease, or unspecified chronic kidney disease: Secondary | ICD-10-CM | POA: Diagnosis not present

## 2023-07-17 DIAGNOSIS — M810 Age-related osteoporosis without current pathological fracture: Secondary | ICD-10-CM | POA: Diagnosis not present

## 2023-07-17 DIAGNOSIS — J9611 Chronic respiratory failure with hypoxia: Secondary | ICD-10-CM | POA: Diagnosis not present

## 2023-07-17 DIAGNOSIS — I700 Atherosclerosis of aorta: Secondary | ICD-10-CM | POA: Diagnosis not present

## 2023-07-17 DIAGNOSIS — E039 Hypothyroidism, unspecified: Secondary | ICD-10-CM | POA: Diagnosis not present

## 2023-07-17 DIAGNOSIS — E1122 Type 2 diabetes mellitus with diabetic chronic kidney disease: Secondary | ICD-10-CM | POA: Diagnosis not present

## 2023-07-17 DIAGNOSIS — K5900 Constipation, unspecified: Secondary | ICD-10-CM | POA: Diagnosis not present

## 2023-07-17 DIAGNOSIS — G319 Degenerative disease of nervous system, unspecified: Secondary | ICD-10-CM | POA: Diagnosis not present

## 2023-07-17 DIAGNOSIS — Z556 Problems related to health literacy: Secondary | ICD-10-CM | POA: Diagnosis not present

## 2023-07-17 DIAGNOSIS — R296 Repeated falls: Secondary | ICD-10-CM | POA: Diagnosis not present

## 2023-07-17 DIAGNOSIS — Z87440 Personal history of urinary (tract) infections: Secondary | ICD-10-CM | POA: Diagnosis not present

## 2023-07-17 DIAGNOSIS — Z8616 Personal history of COVID-19: Secondary | ICD-10-CM | POA: Diagnosis not present

## 2023-07-17 DIAGNOSIS — Z9981 Dependence on supplemental oxygen: Secondary | ICD-10-CM | POA: Diagnosis not present

## 2023-07-17 DIAGNOSIS — K5732 Diverticulitis of large intestine without perforation or abscess without bleeding: Secondary | ICD-10-CM | POA: Diagnosis not present

## 2023-07-17 DIAGNOSIS — Z7982 Long term (current) use of aspirin: Secondary | ICD-10-CM | POA: Diagnosis not present

## 2023-07-17 DIAGNOSIS — M159 Polyosteoarthritis, unspecified: Secondary | ICD-10-CM | POA: Diagnosis not present

## 2023-07-21 DIAGNOSIS — R5381 Other malaise: Secondary | ICD-10-CM | POA: Diagnosis not present

## 2023-07-21 DIAGNOSIS — G319 Degenerative disease of nervous system, unspecified: Secondary | ICD-10-CM | POA: Diagnosis not present

## 2023-07-24 DIAGNOSIS — Z87440 Personal history of urinary (tract) infections: Secondary | ICD-10-CM | POA: Diagnosis not present

## 2023-07-24 DIAGNOSIS — Z7983 Long term (current) use of bisphosphonates: Secondary | ICD-10-CM | POA: Diagnosis not present

## 2023-07-24 DIAGNOSIS — I129 Hypertensive chronic kidney disease with stage 1 through stage 4 chronic kidney disease, or unspecified chronic kidney disease: Secondary | ICD-10-CM | POA: Diagnosis not present

## 2023-07-24 DIAGNOSIS — F17210 Nicotine dependence, cigarettes, uncomplicated: Secondary | ICD-10-CM | POA: Diagnosis not present

## 2023-07-24 DIAGNOSIS — I700 Atherosclerosis of aorta: Secondary | ICD-10-CM | POA: Diagnosis not present

## 2023-07-24 DIAGNOSIS — J9611 Chronic respiratory failure with hypoxia: Secondary | ICD-10-CM | POA: Diagnosis not present

## 2023-07-24 DIAGNOSIS — G319 Degenerative disease of nervous system, unspecified: Secondary | ICD-10-CM | POA: Diagnosis not present

## 2023-07-24 DIAGNOSIS — Z8616 Personal history of COVID-19: Secondary | ICD-10-CM | POA: Diagnosis not present

## 2023-07-24 DIAGNOSIS — N183 Chronic kidney disease, stage 3 unspecified: Secondary | ICD-10-CM | POA: Diagnosis not present

## 2023-07-24 DIAGNOSIS — M159 Polyosteoarthritis, unspecified: Secondary | ICD-10-CM | POA: Diagnosis not present

## 2023-07-24 DIAGNOSIS — Z556 Problems related to health literacy: Secondary | ICD-10-CM | POA: Diagnosis not present

## 2023-07-24 DIAGNOSIS — Z7982 Long term (current) use of aspirin: Secondary | ICD-10-CM | POA: Diagnosis not present

## 2023-07-24 DIAGNOSIS — E46 Unspecified protein-calorie malnutrition: Secondary | ICD-10-CM | POA: Diagnosis not present

## 2023-07-24 DIAGNOSIS — E1122 Type 2 diabetes mellitus with diabetic chronic kidney disease: Secondary | ICD-10-CM | POA: Diagnosis not present

## 2023-07-24 DIAGNOSIS — K5732 Diverticulitis of large intestine without perforation or abscess without bleeding: Secondary | ICD-10-CM | POA: Diagnosis not present

## 2023-07-24 DIAGNOSIS — E039 Hypothyroidism, unspecified: Secondary | ICD-10-CM | POA: Diagnosis not present

## 2023-07-24 DIAGNOSIS — G894 Chronic pain syndrome: Secondary | ICD-10-CM | POA: Diagnosis not present

## 2023-07-24 DIAGNOSIS — K5900 Constipation, unspecified: Secondary | ICD-10-CM | POA: Diagnosis not present

## 2023-07-24 DIAGNOSIS — R296 Repeated falls: Secondary | ICD-10-CM | POA: Diagnosis not present

## 2023-07-24 DIAGNOSIS — J449 Chronic obstructive pulmonary disease, unspecified: Secondary | ICD-10-CM | POA: Diagnosis not present

## 2023-07-24 DIAGNOSIS — M810 Age-related osteoporosis without current pathological fracture: Secondary | ICD-10-CM | POA: Diagnosis not present

## 2023-07-24 DIAGNOSIS — Z9981 Dependence on supplemental oxygen: Secondary | ICD-10-CM | POA: Diagnosis not present

## 2023-07-26 ENCOUNTER — Ambulatory Visit: Payer: 59 | Admitting: Podiatry

## 2023-07-31 ENCOUNTER — Encounter (HOSPITAL_COMMUNITY)
Admission: RE | Admit: 2023-07-31 | Discharge: 2023-07-31 | Disposition: A | Discharge status: Home / Self Care | Payer: 59 | Source: Ambulatory Visit | Attending: Urology | Admitting: Urology

## 2023-07-31 ENCOUNTER — Encounter (HOSPITAL_COMMUNITY): Payer: Self-pay

## 2023-07-31 ENCOUNTER — Other Ambulatory Visit: Payer: Self-pay

## 2023-07-31 NOTE — Pre-Procedure Instructions (Signed)
 Spoke with [name]- daughter to do pre-op phone call.

## 2023-08-01 DIAGNOSIS — E039 Hypothyroidism, unspecified: Secondary | ICD-10-CM | POA: Diagnosis not present

## 2023-08-01 DIAGNOSIS — R296 Repeated falls: Secondary | ICD-10-CM | POA: Diagnosis not present

## 2023-08-01 DIAGNOSIS — J449 Chronic obstructive pulmonary disease, unspecified: Secondary | ICD-10-CM | POA: Diagnosis not present

## 2023-08-01 DIAGNOSIS — E1122 Type 2 diabetes mellitus with diabetic chronic kidney disease: Secondary | ICD-10-CM | POA: Diagnosis not present

## 2023-08-01 DIAGNOSIS — I700 Atherosclerosis of aorta: Secondary | ICD-10-CM | POA: Diagnosis not present

## 2023-08-01 DIAGNOSIS — M810 Age-related osteoporosis without current pathological fracture: Secondary | ICD-10-CM | POA: Diagnosis not present

## 2023-08-01 DIAGNOSIS — Z7983 Long term (current) use of bisphosphonates: Secondary | ICD-10-CM | POA: Diagnosis not present

## 2023-08-01 DIAGNOSIS — E46 Unspecified protein-calorie malnutrition: Secondary | ICD-10-CM | POA: Diagnosis not present

## 2023-08-01 DIAGNOSIS — Z87440 Personal history of urinary (tract) infections: Secondary | ICD-10-CM | POA: Diagnosis not present

## 2023-08-01 DIAGNOSIS — Z556 Problems related to health literacy: Secondary | ICD-10-CM | POA: Diagnosis not present

## 2023-08-01 DIAGNOSIS — J9611 Chronic respiratory failure with hypoxia: Secondary | ICD-10-CM | POA: Diagnosis not present

## 2023-08-01 DIAGNOSIS — K5900 Constipation, unspecified: Secondary | ICD-10-CM | POA: Diagnosis not present

## 2023-08-01 DIAGNOSIS — G894 Chronic pain syndrome: Secondary | ICD-10-CM | POA: Diagnosis not present

## 2023-08-01 DIAGNOSIS — Z8616 Personal history of COVID-19: Secondary | ICD-10-CM | POA: Diagnosis not present

## 2023-08-01 DIAGNOSIS — U071 COVID-19: Secondary | ICD-10-CM | POA: Diagnosis not present

## 2023-08-01 DIAGNOSIS — M159 Polyosteoarthritis, unspecified: Secondary | ICD-10-CM | POA: Diagnosis not present

## 2023-08-01 DIAGNOSIS — G319 Degenerative disease of nervous system, unspecified: Secondary | ICD-10-CM | POA: Diagnosis not present

## 2023-08-01 DIAGNOSIS — I129 Hypertensive chronic kidney disease with stage 1 through stage 4 chronic kidney disease, or unspecified chronic kidney disease: Secondary | ICD-10-CM | POA: Diagnosis not present

## 2023-08-01 DIAGNOSIS — J9601 Acute respiratory failure with hypoxia: Secondary | ICD-10-CM | POA: Diagnosis not present

## 2023-08-01 DIAGNOSIS — F17210 Nicotine dependence, cigarettes, uncomplicated: Secondary | ICD-10-CM | POA: Diagnosis not present

## 2023-08-01 DIAGNOSIS — K5732 Diverticulitis of large intestine without perforation or abscess without bleeding: Secondary | ICD-10-CM | POA: Diagnosis not present

## 2023-08-01 DIAGNOSIS — Z9981 Dependence on supplemental oxygen: Secondary | ICD-10-CM | POA: Diagnosis not present

## 2023-08-01 DIAGNOSIS — Z7982 Long term (current) use of aspirin: Secondary | ICD-10-CM | POA: Diagnosis not present

## 2023-08-01 DIAGNOSIS — N183 Chronic kidney disease, stage 3 unspecified: Secondary | ICD-10-CM | POA: Diagnosis not present

## 2023-08-02 MED ORDER — GEMCITABINE CHEMO FOR BLADDER INSTILLATION 2000 MG
2000.0000 mg | Freq: Once | INTRAVENOUS | Status: DC
  Filled 2023-08-02: qty 52.6

## 2023-08-03 ENCOUNTER — Ambulatory Visit (HOSPITAL_BASED_OUTPATIENT_CLINIC_OR_DEPARTMENT_OTHER): Payer: 59 | Admitting: Certified Registered"

## 2023-08-03 ENCOUNTER — Encounter (HOSPITAL_COMMUNITY)
Admission: RE | Disposition: A | Discharge status: Home / Self Care | Payer: Self-pay | Source: Home / Self Care | Attending: Urology

## 2023-08-03 ENCOUNTER — Encounter (HOSPITAL_COMMUNITY): Payer: Self-pay | Admitting: Urology

## 2023-08-03 ENCOUNTER — Observation Stay (HOSPITAL_COMMUNITY)
Admission: RE | Admit: 2023-08-03 | Discharge: 2023-08-04 | Disposition: A | Discharge status: Home / Self Care | Payer: 59 | Attending: Urology | Admitting: Urology

## 2023-08-03 ENCOUNTER — Other Ambulatory Visit: Payer: Self-pay

## 2023-08-03 ENCOUNTER — Ambulatory Visit (HOSPITAL_COMMUNITY): Payer: 59 | Admitting: Certified Registered"

## 2023-08-03 DIAGNOSIS — N3000 Acute cystitis without hematuria: Secondary | ICD-10-CM

## 2023-08-03 DIAGNOSIS — E039 Hypothyroidism, unspecified: Secondary | ICD-10-CM | POA: Diagnosis not present

## 2023-08-03 DIAGNOSIS — F17210 Nicotine dependence, cigarettes, uncomplicated: Secondary | ICD-10-CM

## 2023-08-03 DIAGNOSIS — Z96653 Presence of artificial knee joint, bilateral: Secondary | ICD-10-CM | POA: Diagnosis not present

## 2023-08-03 DIAGNOSIS — N3010 Interstitial cystitis (chronic) without hematuria: Secondary | ICD-10-CM | POA: Diagnosis not present

## 2023-08-03 DIAGNOSIS — R7309 Other abnormal glucose: Secondary | ICD-10-CM | POA: Insufficient documentation

## 2023-08-03 DIAGNOSIS — D494 Neoplasm of unspecified behavior of bladder: Secondary | ICD-10-CM | POA: Diagnosis not present

## 2023-08-03 DIAGNOSIS — I10 Essential (primary) hypertension: Secondary | ICD-10-CM | POA: Diagnosis not present

## 2023-08-03 DIAGNOSIS — N3020 Other chronic cystitis without hematuria: Secondary | ICD-10-CM | POA: Diagnosis not present

## 2023-08-03 DIAGNOSIS — C679 Malignant neoplasm of bladder, unspecified: Principal | ICD-10-CM | POA: Insufficient documentation

## 2023-08-03 DIAGNOSIS — Z8673 Personal history of transient ischemic attack (TIA), and cerebral infarction without residual deficits: Secondary | ICD-10-CM | POA: Diagnosis not present

## 2023-08-03 DIAGNOSIS — J449 Chronic obstructive pulmonary disease, unspecified: Secondary | ICD-10-CM | POA: Diagnosis not present

## 2023-08-03 DIAGNOSIS — F17200 Nicotine dependence, unspecified, uncomplicated: Secondary | ICD-10-CM | POA: Diagnosis not present

## 2023-08-03 DIAGNOSIS — N3289 Other specified disorders of bladder: Secondary | ICD-10-CM | POA: Diagnosis not present

## 2023-08-03 HISTORY — PX: TRANSURETHRAL RESECTION OF BLADDER TUMOR: SHX2575

## 2023-08-03 HISTORY — PX: CYSTOSCOPY: SHX5120

## 2023-08-03 LAB — GLUCOSE, CAPILLARY
Glucose-Capillary: 98 mg/dL (ref 70–99)
Glucose-Capillary: 103 mg/dL — ABNORMAL HIGH (ref 70–99)
Glucose-Capillary: 229 mg/dL — ABNORMAL HIGH (ref 70–99)
Glucose-Capillary: 178 mg/dL — ABNORMAL HIGH (ref 70–99)

## 2023-08-03 SURGERY — TURBT (TRANSURETHRAL RESECTION OF BLADDER TUMOR)
Anesthesia: General | Site: Bladder

## 2023-08-03 MED ORDER — DIPHENHYDRAMINE HCL 50 MG/ML IJ SOLN: 12.5000 mg | Freq: Four times a day (QID) | INTRAMUSCULAR | Status: DC | PRN

## 2023-08-03 MED ORDER — SODIUM CHLORIDE 0.9 % IR SOLN
Status: DC | PRN
  Administered 2023-08-03 (×2): 3000 mL via INTRAVESICAL

## 2023-08-03 MED ORDER — CHLORHEXIDINE GLUCONATE 0.12 % MT SOLN
OROMUCOSAL | Status: AC
  Administered 2023-08-03: 15 mL
  Filled 2023-08-03: qty 15

## 2023-08-03 MED ORDER — INSULIN ASPART 100 UNIT/ML IJ SOLN
0.0000 [IU] | Freq: Three times a day (TID) | INTRAMUSCULAR | Status: DC
  Administered 2023-08-03: 5 [IU] via SUBCUTANEOUS

## 2023-08-03 MED ORDER — OXYBUTYNIN CHLORIDE 5 MG PO TABS: 5.0000 mg | ORAL_TABLET | Freq: Three times a day (TID) | ORAL | Status: DC | PRN

## 2023-08-03 MED ORDER — SODIUM CHLORIDE 0.9 % IV SOLN
250.0000 mL | INTRAVENOUS | Status: DC | PRN
Start: 2023-08-04 — End: 2023-08-04

## 2023-08-03 MED ORDER — LACTATED RINGERS IV SOLN: INTRAVENOUS | Status: DC

## 2023-08-03 MED ORDER — HYDROCODONE-ACETAMINOPHEN 5-325 MG PO TABS
1.0000 | ORAL_TABLET | ORAL | Status: DC | PRN
Start: 2023-08-03 — End: 2023-08-04
  Administered 2023-08-03: 2 via ORAL
  Filled 2023-08-03: qty 2

## 2023-08-03 MED ORDER — PROPOFOL 10 MG/ML IV BOLUS
INTRAVENOUS | Status: DC | PRN
  Administered 2023-08-03: 50 mg via INTRAVENOUS
  Administered 2023-08-03: 80 mg via INTRAVENOUS

## 2023-08-03 MED ORDER — PHENYLEPHRINE 80 MCG/ML (10ML) SYRINGE FOR IV PUSH (FOR BLOOD PRESSURE SUPPORT)
PREFILLED_SYRINGE | INTRAVENOUS | Status: AC
  Filled 2023-08-03: qty 10

## 2023-08-03 MED ORDER — ATORVASTATIN CALCIUM 20 MG PO TABS: 20.0000 mg | ORAL_TABLET | Freq: Every day | ORAL | Status: DC

## 2023-08-03 MED ORDER — ORAL CARE MOUTH RINSE: 15.0000 mL | Freq: Once | OROMUCOSAL | Status: DC

## 2023-08-03 MED ORDER — ASPIRIN 81 MG PO TBEC
81.0000 mg | DELAYED_RELEASE_TABLET | Freq: Every morning | ORAL | Status: DC
  Administered 2023-08-04: 81 mg via ORAL
  Filled 2023-08-03: qty 1

## 2023-08-03 MED ORDER — GEMCITABINE CHEMO FOR BLADDER INSTILLATION 2000 MG
INTRAVENOUS | Status: DC | PRN
  Administered 2023-08-03: 2000 mg via INTRAVESICAL

## 2023-08-03 MED ORDER — STERILE WATER FOR IRRIGATION IR SOLN
Status: DC | PRN
  Administered 2023-08-03: 500 mL

## 2023-08-03 MED ORDER — SENNOSIDES-DOCUSATE SODIUM 8.6-50 MG PO TABS
2.0000 | ORAL_TABLET | Freq: Every day | ORAL | Status: DC
  Administered 2023-08-03: 2 via ORAL
  Filled 2023-08-03: qty 2

## 2023-08-03 MED ORDER — SODIUM CHLORIDE 0.9% FLUSH: 3.0000 mL | INTRAVENOUS | Status: DC | PRN

## 2023-08-03 MED ORDER — CEFAZOLIN SODIUM-DEXTROSE 2-4 GM/100ML-% IV SOLN
INTRAVENOUS | Status: AC
  Filled 2023-08-03: qty 100

## 2023-08-03 MED ORDER — ONDANSETRON HCL 4 MG/2ML IJ SOLN
INTRAMUSCULAR | Status: DC | PRN
  Administered 2023-08-03: 4 mg via INTRAVENOUS

## 2023-08-03 MED ORDER — DIPHENHYDRAMINE HCL 12.5 MG/5ML PO ELIX: 12.5000 mg | ORAL_SOLUTION | Freq: Four times a day (QID) | ORAL | Status: DC | PRN

## 2023-08-03 MED ORDER — ZOLPIDEM TARTRATE 5 MG PO TABS: 5.0000 mg | ORAL_TABLET | Freq: Every evening | ORAL | Status: DC | PRN

## 2023-08-03 MED ORDER — LIDOCAINE HCL (CARDIAC) PF 100 MG/5ML IV SOSY
PREFILLED_SYRINGE | INTRAVENOUS | Status: DC | PRN
  Administered 2023-08-03: 80 mg via INTRAVENOUS

## 2023-08-03 MED ORDER — CEFAZOLIN SODIUM-DEXTROSE 2-4 GM/100ML-% IV SOLN
2.0000 g | INTRAVENOUS | Status: AC
  Administered 2023-08-03: 2 g via INTRAVENOUS

## 2023-08-03 MED ORDER — FENTANYL CITRATE PF 50 MCG/ML IJ SOSY
25.0000 ug | PREFILLED_SYRINGE | INTRAMUSCULAR | Status: DC | PRN
  Administered 2023-08-03 (×2): 25 ug via INTRAVENOUS
  Filled 2023-08-03: qty 1

## 2023-08-03 MED ORDER — ESMOLOL HCL 100 MG/10ML IV SOLN
INTRAVENOUS | Status: DC | PRN
  Administered 2023-08-03 (×2): 30 mg via INTRAVENOUS

## 2023-08-03 MED ORDER — SUGAMMADEX SODIUM 200 MG/2ML IV SOLN
INTRAVENOUS | Status: DC | PRN
  Administered 2023-08-03: 200 mg via INTRAVENOUS

## 2023-08-03 MED ORDER — PHENYLEPHRINE HCL (PRESSORS) 10 MG/ML IV SOLN
INTRAVENOUS | Status: DC | PRN
  Administered 2023-08-03: 40 ug via INTRAVENOUS
  Administered 2023-08-03: 160 ug via INTRAVENOUS

## 2023-08-03 MED ORDER — PROPOFOL 10 MG/ML IV BOLUS
INTRAVENOUS | Status: AC
Start: 2023-08-03 — End: ?
  Filled 2023-08-03: qty 20

## 2023-08-03 MED ORDER — DEXAMETHASONE SODIUM PHOSPHATE 10 MG/ML IJ SOLN
INTRAMUSCULAR | Status: DC | PRN
  Administered 2023-08-03: 10 mg via INTRAVENOUS

## 2023-08-03 MED ORDER — EPHEDRINE 5 MG/ML INJ
INTRAVENOUS | Status: AC
  Filled 2023-08-03: qty 5

## 2023-08-03 MED ORDER — DONEPEZIL HCL 5 MG PO TABS
5.0000 mg | ORAL_TABLET | Freq: Every day | ORAL | Status: DC
  Administered 2023-08-03: 5 mg via ORAL
  Filled 2023-08-03: qty 1

## 2023-08-03 MED ORDER — LEVOTHYROXINE SODIUM 75 MCG PO TABS
75.0000 ug | ORAL_TABLET | Freq: Every day | ORAL | Status: DC
  Administered 2023-08-04: 75 ug via ORAL
  Filled 2023-08-03: qty 1

## 2023-08-03 MED ORDER — DULOXETINE HCL 30 MG PO CPEP
30.0000 mg | ORAL_CAPSULE | Freq: Every day | ORAL | Status: DC
  Administered 2023-08-04: 30 mg via ORAL
  Filled 2023-08-03: qty 1

## 2023-08-03 MED ORDER — FUROSEMIDE 20 MG PO TABS
20.0000 mg | ORAL_TABLET | Freq: Every day | ORAL | Status: DC
  Administered 2023-08-03 – 2023-08-04 (×2): 20 mg via ORAL
  Filled 2023-08-03 (×2): qty 1

## 2023-08-03 MED ORDER — FENTANYL CITRATE (PF) 100 MCG/2ML IJ SOLN
INTRAMUSCULAR | Status: DC | PRN
  Administered 2023-08-03: 25 ug via INTRAVENOUS
  Administered 2023-08-03: 50 ug via INTRAVENOUS

## 2023-08-03 MED ORDER — CHLORHEXIDINE GLUCONATE 0.12 % MT SOLN: 15.0000 mL | Freq: Once | OROMUCOSAL | Status: DC

## 2023-08-03 MED ORDER — IPRATROPIUM-ALBUTEROL 0.5-2.5 (3) MG/3ML IN SOLN
3.0000 mL | RESPIRATORY_TRACT | Status: DC
  Administered 2023-08-03: 3 mL via RESPIRATORY_TRACT

## 2023-08-03 MED ORDER — SODIUM CHLORIDE 0.9% FLUSH
3.0000 mL | Freq: Two times a day (BID) | INTRAVENOUS | Status: DC
  Administered 2023-08-03 – 2023-08-04 (×2): 3 mL via INTRAVENOUS

## 2023-08-03 MED ORDER — ROCURONIUM BROMIDE 10 MG/ML (PF) SYRINGE
PREFILLED_SYRINGE | INTRAVENOUS | Status: DC | PRN
  Administered 2023-08-03: 30 mg via INTRAVENOUS

## 2023-08-03 MED ORDER — ORAL CARE MOUTH RINSE: 15.0000 mL | OROMUCOSAL | Status: DC | PRN

## 2023-08-03 MED ORDER — FENTANYL CITRATE PF 50 MCG/ML IJ SOSY
25.0000 ug | PREFILLED_SYRINGE | INTRAMUSCULAR | Status: DC | PRN
Start: 2023-08-03 — End: 2023-08-04
  Administered 2023-08-03: 50 ug via INTRAVENOUS
  Filled 2023-08-03: qty 1

## 2023-08-03 MED ORDER — IPRATROPIUM-ALBUTEROL 0.5-2.5 (3) MG/3ML IN SOLN
RESPIRATORY_TRACT | Status: AC
  Filled 2023-08-03: qty 3

## 2023-08-03 MED ORDER — ACETAMINOPHEN 325 MG PO TABS: 650.0000 mg | ORAL_TABLET | ORAL | Status: DC | PRN

## 2023-08-03 MED ORDER — ONDANSETRON HCL 4 MG/2ML IJ SOLN
4.0000 mg | INTRAMUSCULAR | Status: DC | PRN
Start: 2023-08-03 — End: 2023-08-04

## 2023-08-03 MED ORDER — HYDROCODONE-ACETAMINOPHEN 7.5-325 MG PO TABS: 1.0000 | ORAL_TABLET | Freq: Once | ORAL | Status: DC | PRN

## 2023-08-03 MED ORDER — FENTANYL CITRATE (PF) 100 MCG/2ML IJ SOLN
INTRAMUSCULAR | Status: AC
  Filled 2023-08-03: qty 2

## 2023-08-03 MED ORDER — SODIUM CHLORIDE 0.9 % IV SOLN: INTRAVENOUS | Status: DC

## 2023-08-03 MED ORDER — ONDANSETRON HCL 4 MG/2ML IJ SOLN: 4.0000 mg | Freq: Once | INTRAMUSCULAR | Status: DC | PRN

## 2023-08-03 MED ORDER — INSULIN ASPART 100 UNIT/ML IJ SOLN: 0.0000 [IU] | Freq: Every day | INTRAMUSCULAR | Status: DC

## 2023-08-03 SURGICAL SUPPLY — 24 items
BAG DRAIN URO TABLE W/ADPT NS (BAG) ×1 IMPLANT
BAG HAMPER (MISCELLANEOUS) ×1 IMPLANT
BAG URINE DRAIN 2000ML AR STRL (UROLOGICAL SUPPLIES) ×1 IMPLANT
CATH FOLEY 2WAY SLVR 30CC 20FR (CATHETERS) IMPLANT
CLOTH BEACON ORANGE TIMEOUT ST (SAFETY) ×1 IMPLANT
ELECT LOOP 22F BIPOLAR SML (ELECTROSURGICAL) ×1
ELECTRODE LOOP 22F BIPOLAR SML (ELECTROSURGICAL) ×1 IMPLANT
GLOVE BIO SURGEON STRL SZ8 (GLOVE) ×1 IMPLANT
GLOVE BIOGEL PI IND STRL 7.0 (GLOVE) ×2 IMPLANT
GOWN STRL REUS W/TWL LRG LVL3 (GOWN DISPOSABLE) ×2 IMPLANT
GOWN STRL REUS W/TWL XL LVL3 (GOWN DISPOSABLE) ×1 IMPLANT
IV NS IRRIG 3000ML ARTHROMATIC (IV SOLUTION) ×2 IMPLANT
KIT CHEMO SPILL (MISCELLANEOUS) ×1 IMPLANT
KIT TURNOVER CYSTO (KITS) ×1 IMPLANT
MANIFOLD NEPTUNE II (INSTRUMENTS) ×1 IMPLANT
PACK CYSTO (CUSTOM PROCEDURE TRAY) ×1 IMPLANT
PAD ARMBOARD 7.5X6 YLW CONV (MISCELLANEOUS) ×1 IMPLANT
PLUG CATH AND CAP STER (CATHETERS) IMPLANT
POSITIONER HEAD 8X9X4 ADT (SOFTGOODS) ×1 IMPLANT
SYR 30ML LL (SYRINGE) ×1 IMPLANT
SYR TOOMEY IRRIG 70ML (MISCELLANEOUS) ×1
SYRINGE TOOMEY IRRIG 70ML (MISCELLANEOUS) ×1 IMPLANT
TOWEL OR 17X26 4PK STRL BLUE (TOWEL DISPOSABLE) ×1 IMPLANT
WATER STERILE IRR 500ML POUR (IV SOLUTION) ×1 IMPLANT

## 2023-08-03 NOTE — Anesthesia Postprocedure Evaluation (Signed)
Anesthesia Post Note  Patient: [name]  Procedure(s) Performed: TRANSURETHRAL RESECTION OF BLADDER TUMOR  (TURBT) WITH GEMCITABINE (Bladder) CYSTOSCOPY (Bladder)  Patient location during evaluation: PACU Anesthesia Type: General Level of consciousness: awake and alert Pain management: pain level controlled Vital Signs Assessment: post-procedure vital signs reviewed and stable Respiratory status: spontaneous breathing, nonlabored ventilation, respiratory function stable and patient connected to nasal cannula oxygen Cardiovascular status: blood pressure returned to baseline and stable Postop Assessment: no apparent nausea or vomiting Anesthetic complications: no   There were no known notable events for this encounter.   Last Vitals:  Vitals:   08/03/23 1345 08/03/23 1430  BP: (!) 107/48 (!) 100/57  Pulse: 63 65  Resp: 17 18  Temp:  36.6 C  SpO2: 92% 95%    Last Pain:  Vitals:   08/03/23 1430  TempSrc: Oral  PainSc:                  Amanda Armstrong

## 2023-08-03 NOTE — Plan of Care (Signed)

## 2023-08-03 NOTE — Progress Notes (Signed)
   08/03/23 1539  TOC Brief Assessment  Insurance and Status Reviewed  Patient has primary care physician Yes  Home environment has been reviewed Single Family Home  Prior level of function: Independent  Prior/Current Home Services No current home services  Social Drivers of Health Review SDOH reviewed no interventions necessary  Readmission risk has been reviewed Yes  Transition of care needs no transition of care needs at this time     Transition of Care Department [facility]) has reviewed patient and no TOC needs have been identified at this time. We will continue to monitor patient advancement through interdisciplinary progression rounds. If new patient transition needs arise, please place a TOC consult.

## 2023-08-03 NOTE — Op Note (Signed)
.  Preoperative diagnosis: bladder tumor  Postoperative diagnosis: Same  Procedure: 1 cystoscopy 2. Transurethral resection of bladder tumor, large 3. Instillation of bladder chemotherapy agent  Attending: Wilkie Aye  Anesthesia: General  Estimated blood loss: Minimal  Drains: 22 French foley  Specimens: bladder tumor  Antibiotics: ancef  Findings: 5cm papillary and sessile dome tumor.  Ureteral orifices in normal anatomic location.   Indications: Patient is a 78 year old female with a history of bladder tumor and gross hematuria.  After discussing treatment options, they decided proceed with transurethral resection of a bladder tumor.  Procedure in detail: The patient was brought to the operating room and a brief timeout was done to ensure correct patient, correct procedure, correct site.  General anesthesia was administered patient was placed in dorsal lithotomy position.  Their genitalia was then prepped and draped in usual sterile fashion.  A rigid 22 French cystoscope was passed in the urethra and the bladder.  Bladder was inspected and we noted a 5cm bladder tumor.  the ureteral orifices were in the normal orthotopic locations.  Using the bipolar resectoscope we removed the bladder tumor down to the base. Hemostasis was then obtained with electrocautery. We then removed the bladder tumor chips and sent them for pathology. We then re-inspected the bladder and found no residual bleeding.  the bladder was then drained, a 22 French foley was placed and 2g of gemcitabine was instilled into the bladder. This concluded the procedure which was well tolerated by patient.  Complications: None  Condition: Stable, extubated, transferred to PACU  Plan: Patient is admitted overnight observation. The gemcitabine is to be drained in 1 your. If their urine is clear tomorrow they will be discharged home and followup in 5 days for foley catheter removal and pathology discussion.

## 2023-08-03 NOTE — Anesthesia Procedure Notes (Signed)
Procedure Name: Intubation Date/Time: 08/03/2023 11:58 AM  Performed by: Lendon Ka, CRNAPre-anesthesia Checklist: Patient identified, Emergency Drugs available, Suction available and Patient being monitored Patient Re-evaluated:Patient Re-evaluated prior to induction Oxygen Delivery Method: Circle System Utilized Preoxygenation: Pre-oxygenation with 100% oxygen Induction Type: IV induction Ventilation: Mask ventilation without difficulty Laryngoscope Size: Miller and 2 Grade View: Grade I Tube type: Oral Tube size: 7.0 mm Number of attempts: 1 Airway Equipment and Method: Stylet and Oral airway Placement Confirmation: ETT inserted through vocal cords under direct vision, positive ETCO2 and breath sounds checked- equal and bilateral Secured at: 21 cm Tube secured with: Tape Dental Injury: Teeth and Oropharynx as per pre-operative assessment  Comments: With ease

## 2023-08-03 NOTE — Transfer of Care (Signed)
Immediate Anesthesia Transfer of Care Note  Patient: Amanda Armstrong  Procedure(s) Performed: TRANSURETHRAL RESECTION OF BLADDER TUMOR  (TURBT) WITH GEMCITABINE (Bladder) CYSTOSCOPY (Bladder)  Patient Location: PACU  Anesthesia Type:General  Level of Consciousness: drowsy  Airway & Oxygen Therapy: Patient Spontanous Breathing and Patient connected to face mask oxygen  Post-op Assessment: Report given to RN and Post -op Vital signs reviewed and stable  Post vital signs: Reviewed and stable  Last Vitals:  Vitals Value Taken Time  BP    Temp    Pulse    Resp    SpO2      Last Pain:  Vitals:   08/03/23 1126  TempSrc: Oral  PainSc: 10-Worst pain ever         Complications: No notable events documented.

## 2023-08-03 NOTE — Anesthesia Preprocedure Evaluation (Addendum)
Anesthesia Evaluation  Patient identified by MRN, date of birth, ID band Patient awake    Reviewed: Allergy & Precautions, H&P , NPO status , Patient's Chart, lab work & pertinent test results  Airway Mallampati: I  TM Distance: >3 FB Neck ROM: full    Dental  (+) Lower Dentures, Upper Dentures   Pulmonary COPD,  COPD inhaler, Current Smoker and Patient abstained from smoking. History of respiratory failure with hypoxia    + decreased breath sounds      Cardiovascular hypertension, Pt. on medications Normal cardiovascular exam Rhythm:regular Rate:Normal     Neuro/Psych  Headaches PSYCHIATRIC DISORDERS Anxiety     vertigo CVA    GI/Hepatic   Endo/Other  diabetes, Poorly Controlled, Type 2Hypothyroidism    Renal/GU Renal InsufficiencyRenal disease     Musculoskeletal  (+) Arthritis , Osteoarthritis,    Abdominal   Peds  Hematology   Anesthesia Other Findings   Reproductive/Obstetrics                             Anesthesia Physical Anesthesia Plan  ASA: 4  Anesthesia Plan: General   Post-op Pain Management:    Induction: Intravenous  PONV Risk Score and Plan: Ondansetron and Dexamethasone  Airway Management Planned: LMA  Additional Equipment: None  Intra-op Plan:   Post-operative Plan:   Informed Consent: I have reviewed the patients History and Physical, chart, labs and discussed the procedure including the risks, benefits and alternatives for the proposed anesthesia with the patient or authorized representative who has indicated his/her understanding and acceptance.       Plan Discussed with: CRNA  Anesthesia Plan Comments:         Anesthesia Quick Evaluation

## 2023-08-03 NOTE — H&P (Signed)
HPI: [name] is a 77yo here for bladder tumor resection for a bladder mass. She underwent CT on 10/19 which showed thickening of the dome and left lateral wall of the bladder. She has known microhematuria. She denies any recurrent UTI. She has intermittent dysuria and bothersome urinary frequency.     PMH:     Past Medical History:  Diagnosis Date   Anxiety     Arthritis      Bilateral Knee DJD   Chronic pain     Chronically on opiate therapy     Clostridium difficile carrier      pt stated that she has intermittent sx   Clostridium difficile infection     Daytime somnolence     Frequent falls     Headache(784.0)      SINUS HEADACHE OCCASSIONALLY   Hypertension     Hypothyroidism     Multifactorial gait disorder     Osteoporosis     Right foot drop     Snoring     Stroke (HCC)     Tobacco use     Vertigo     Weakness            Surgical History:      Past Surgical History:  Procedure Laterality Date   CATARACT EXTRACTION W/PHACO Right 06/28/2016    Procedure: CATARACT EXTRACTION PHACO AND INTRAOCULAR LENS PLACEMENT (IOC);  Surgeon: Jethro Bolus, MD;  Location: [facility];  Service: Ophthalmology;  Laterality: Right;  CDE: 11.10   CATARACT EXTRACTION W/PHACO Left 07/26/2016    Procedure: CATARACT EXTRACTION PHACO AND INTRAOCULAR LENS PLACEMENT (IOC);  Surgeon: Jethro Bolus, MD;  Location: [facility];  Service: Ophthalmology;  Laterality: Left;  CDE: 8.88   JOINT REPLACEMENT   11/08/3010    right total knee   TOTAL KNEE ARTHROPLASTY   11/07/2011    Procedure: TOTAL KNEE ARTHROPLASTY;  Surgeon: Nilda Simmer, MD;  Location: [facility];  Service: Orthopedics;  Laterality: Left;  DR Thurston Hole WANTS 90 MINUTES FOR THIS CASE   TOTAL KNEE ARTHROPLASTY Right 2011   TUBAL LIGATION   1982          Home Medications:  Allergies as of 05/31/2023         Reactions    Iron Other (See Comments)    "upset stomach"    Oxycodone Other (See Comments)    "CX HER TO FELL LIKE SHE IS OUT OF HER  BODY"    Sudafed [pseudoephedrine Hcl] Other (See Comments)    "funny feeling"            Medication List           Accurate as of May 31, 2023  3:28 PM. If you have any questions, ask your nurse or doctor.              Accu-Chek Guide test strip Generic drug: glucose blood USE TEST STRIP TO CHECK GLUCOSE TWICE DAILY BEFORE BREAKFAST AND  BEFORE BEDTIME    Accu-Chek Softclix Lancets lancets USE  TO CHECK GLUCOSE TWICE DAILY    acetaminophen 500 MG tablet Commonly known as: TYLENOL Take 1,000 mg by mouth every 6 (six) hours as needed for mild pain.    alendronate 70 MG tablet Commonly known as: FOSAMAX Take 70 mg by mouth once a week.    ALPRAZolam 1 MG tablet Commonly known as: XANAX Take 1 mg by mouth at bedtime.    Anoro Ellipta 62.5-25 MCG/ACT Aepb Generic drug: umeclidinium-vilanterol Inhale  1 puff into the lungs daily.    aspirin EC 81 MG tablet Take 81 mg by mouth in the morning.    atorvastatin 20 MG tablet Commonly known as: LIPITOR Take 20 mg by mouth daily at 12 noon.    blood glucose meter kit and supplies Dispense based on patient and insurance preference. Use up to four times daily as directed. (FOR ICD-10 E10.9, E11.9).    donepezil 5 MG tablet Commonly known as: ARICEPT Take 5 mg by mouth at bedtime.    furosemide 20 MG tablet Commonly known as: LASIX Take 20 mg by mouth daily.    HYDROcodone-acetaminophen 10-325 MG tablet Commonly known as: NORCO Take 1 tablet by mouth every 4 (four) hours as needed for moderate pain.    levothyroxine 75 MCG tablet Commonly known as: SYNTHROID Take 1 tablet (75 mcg total) by mouth daily before breakfast.    OXYGEN Inhale 1 L/min into the lungs daily.    Pen Needles 3/16" 31G X 5 MM Misc 100 each by Does not apply route 2 (two) times daily.    ReliOn Pen Needles 31G X 6 MM Misc Generic drug: Insulin Pen Needle Use to inject insulin once daily.    polyethylene glycol 17 g packet Commonly  known as: MiraLax Take 17 g by mouth daily.    Vitamin D3 50 MCG (2000 UT) capsule Take 2,000 Units by mouth daily.             Allergies:  Allergies       Allergies  Allergen Reactions   Iron Other (See Comments)      "upset stomach"   Oxycodone Other (See Comments)      "CX HER TO FELL LIKE SHE IS OUT OF HER BODY"   Sudafed [Pseudoephedrine Hcl] Other (See Comments)      "funny feeling"        Family History:      Family History  Problem Relation Age of Onset   Heart attack Mother     COPD Father     Arthritis Sister     Heart disease Brother            Social History:  reports that she has been smoking cigarettes. She has a 48 pack-year smoking history. She has never used smokeless tobacco. She reports that she does not drink alcohol and does not use drugs.   ROS: All other review of systems were reviewed and are negative except what is noted above in HPI   Physical Exam: BP 100/68   Pulse 75   Constitutional:  Alert and oriented, No acute distress. HEENT: [place] AT, moist mucus membranes.  Trachea midline, no masses. Cardiovascular: No clubbing, cyanosis, or edema. Respiratory: Normal respiratory effort, no increased work of breathing. GI: Abdomen is soft, nontender, nondistended, no abdominal masses GU: No CVA tenderness.  Lymph: No cervical or inguinal lymphadenopathy. Skin: No rashes, bruises or suspicious lesions. Neurologic: Grossly intact, no focal deficits, moving all 4 extremities. Psychiatric: Normal mood and affect.   Laboratory Data: Recent Labs       Lab Results  Component Value Date    WBC 6.3 05/06/2023    HGB 13.0 05/06/2023    HCT 40.5 05/06/2023    MCV 92.3 05/06/2023    PLT 193 05/06/2023        Recent Labs       Lab Results  Component Value Date    CREATININE 1.63 (H) 05/06/2023  Recent Labs  No results found for: "PSA"     Recent Labs  No results found for: "TESTOSTERONE"     Recent Labs       Lab Results   Component Value Date    HGBA1C 5.8 (A) 02/27/2023        Urinalysis Labs (Brief)          Component Value Date/Time    COLORURINE YELLOW 05/06/2023 0913    APPEARANCEUR TURBID (A) 05/06/2023 0913    LABSPEC >1.046 (H) 05/06/2023 0913    PHURINE 6.0 05/06/2023 0913    GLUCOSEU NEGATIVE 05/06/2023 0913    HGBUR NEGATIVE 05/06/2023 0913    BILIRUBINUR NEGATIVE 05/06/2023 0913    KETONESUR NEGATIVE 05/06/2023 0913    PROTEINUR 30 (A) 05/06/2023 0913    UROBILINOGEN 0.2 08/27/2013 1130    NITRITE NEGATIVE 05/06/2023 0913    LEUKOCYTESUR LARGE (A) 05/06/2023 0913        Recent Labs       Lab Results  Component Value Date    BACTERIA RARE (A) 05/06/2023        Pertinent Imaging: CT 05/06/2023: Images reviewed and discussed with the patient  No results found for this or any previous visit.   No results found for this or any previous visit.   No results found for this or any previous visit.   No results found for this or any previous visit.   No results found for this or any previous visit.   No valid procedures specified. No results found for this or any previous visit.   No results found for this or any previous visit.       Assessment & Plan:     1. Bladder mass The risks/benefits/alternatives to transurethral resection of a bladder tumor was explained to the patient and he understands and wishes to proceed with surgery

## 2023-08-04 ENCOUNTER — Encounter (HOSPITAL_COMMUNITY): Payer: Self-pay | Admitting: Urology

## 2023-08-04 DIAGNOSIS — I10 Essential (primary) hypertension: Secondary | ICD-10-CM | POA: Diagnosis not present

## 2023-08-04 DIAGNOSIS — E039 Hypothyroidism, unspecified: Secondary | ICD-10-CM | POA: Diagnosis not present

## 2023-08-04 DIAGNOSIS — R7309 Other abnormal glucose: Secondary | ICD-10-CM | POA: Diagnosis not present

## 2023-08-04 DIAGNOSIS — Z8673 Personal history of transient ischemic attack (TIA), and cerebral infarction without residual deficits: Secondary | ICD-10-CM | POA: Diagnosis not present

## 2023-08-04 DIAGNOSIS — C679 Malignant neoplasm of bladder, unspecified: Secondary | ICD-10-CM | POA: Diagnosis not present

## 2023-08-04 DIAGNOSIS — Z96653 Presence of artificial knee joint, bilateral: Secondary | ICD-10-CM | POA: Diagnosis not present

## 2023-08-04 DIAGNOSIS — F17210 Nicotine dependence, cigarettes, uncomplicated: Secondary | ICD-10-CM | POA: Diagnosis not present

## 2023-08-04 LAB — GLUCOSE, CAPILLARY: Glucose-Capillary: 109 mg/dL — ABNORMAL HIGH (ref 70–99)

## 2023-08-04 LAB — SURGICAL PATHOLOGY

## 2023-08-04 MED ORDER — TRAMADOL HCL 50 MG PO TABS: 50.0000 mg | ORAL_TABLET | Freq: Four times a day (QID) | ORAL | 0 refills | Status: DC | PRN

## 2023-08-04 NOTE — Progress Notes (Deleted)
Name: Amanda Armstrong DOB: 03-31-1946 MRN: [id number]  Diagnoses: Post-operative state  HPI: Amanda Armstrong presents post-operatively.  ***She is accompanied by ***.  Today She presents s/p the following procedures by Dr. [name] on 08/03/2023:  Preoperative diagnosis: bladder tumor   Postoperative diagnosis: Same   Procedure:  1. cystoscopy 2. Transurethral resection of bladder tumor, large 3. Instillation of bladder chemotherapy agent   Pathology:  A. BLADDER TUMOR, TURBT:  Urothelium with keratinizing squamous cell metaplasia  Submucosa with prominent acute and chronic inflammation   Postop course: Today She reports ***  She reports the catheter is draining ***.  She {Actions; denies-reports:120008} gross hematuria.  She {Actions; denies-reports:120008} acute flank pain, abdominal pain, fevers, nausea, or vomiting.   Fall Screening: Do you usually have a device to assist in your mobility? {yes/no:20286} ***cane / ***walker / ***wheelchair   Medications: Current Outpatient Medications  Medication Sig Dispense Refill   ACCU-CHEK GUIDE test strip USE TEST STRIP TO CHECK GLUCOSE TWICE DAILY BEFORE BREAKFAST AND  BEFORE BEDTIME 200 each 2   Accu-Chek Softclix Lancets lancets USE  TO CHECK GLUCOSE TWICE DAILY 100 each 2   acetaminophen (TYLENOL) 500 MG tablet Take 1,000 mg by mouth every 6 (six) hours as needed for mild pain.     alendronate (FOSAMAX) 70 MG tablet Take 70 mg by mouth every Sunday.     ALPRAZolam (XANAX) 1 MG tablet Take 1 mg by mouth at bedtime.     aspirin 81 MG EC tablet Take 81 mg by mouth in the morning.     atorvastatin (LIPITOR) 20 MG tablet Take 20 mg by mouth daily at 12 noon.     bismuth subsalicylate (PEPTO BISMOL) 262 MG/15ML suspension Take 30 mLs by mouth every 6 (six) hours as needed for indigestion.     blood glucose meter kit and supplies Dispense based on patient and insurance preference. Use up to four times daily as directed. (FOR  ICD-10 E10.9, E11.9). 1 each 0   Cholecalciferol (VITAMIN D3) 50 MCG (2000 UT) capsule Take 2,000 Units by mouth daily.     donepezil (ARICEPT) 5 MG tablet Take 5 mg by mouth at bedtime.     DULoxetine (CYMBALTA) 30 MG capsule Take 30 mg by mouth daily.     furosemide (LASIX) 20 MG tablet Take 20 mg by mouth daily.     HYDROcodone-acetaminophen (NORCO) 10-325 MG tablet Take 1 tablet by mouth every 4 (four) hours as needed for moderate pain.     hydrOXYzine (ATARAX) 25 MG tablet Take 25 mg by mouth every 6 (six) hours as needed for itching.     levothyroxine (SYNTHROID) 75 MCG tablet Take 1 tablet (75 mcg total) by mouth daily before breakfast. 90 tablet 1   Menthol-Methyl Salicylate (MUSCLE RUB EX) Apply 1 Application topically daily as needed (pain).     OXYGEN Inhale 1 L/min into the lungs daily.     polyethylene glycol (MIRALAX) 17 g packet Take 17 g by mouth daily. (Patient taking differently: Take 17 g by mouth daily as needed for moderate constipation.) 14 each 0   SANTYL 250 UNIT/GM ointment Apply 1 Application topically daily.     sulfamethoxazole-trimethoprim (BACTRIM) 400-80 MG tablet Take 1 tablet by mouth at bedtime.     traMADol (ULTRAM) 50 MG tablet Take 1 tablet (50 mg total) by mouth every 6 (six) hours as needed. 15 tablet 0   TRELEGY ELLIPTA 200-62.5-25 MCG/ACT AEPB Inhale 1 puff into the lungs daily.  No current facility-administered medications for this visit.   Facility-Administered Medications Ordered in Other Visits  Medication Dose Route Frequency Provider Last Rate Last Admin   0.9 %  sodium chloride infusion  250 mL Intravenous PRN McKenzie, Mardene Celeste, MD       acetaminophen (TYLENOL) tablet 650 mg  650 mg Oral Q4H PRN McKenzie, Mardene Celeste, MD       aspirin EC tablet 81 mg  81 mg Oral q AM McKenzie, Mardene Celeste, MD   81 mg at 08/04/23 0910   atorvastatin (LIPITOR) tablet 20 mg  20 mg Oral Q1200 Malen Gauze, MD       diphenhydrAMINE (BENADRYL) injection 12.5  mg  12.5 mg Intravenous Q6H PRN Malen Gauze, MD       Or   diphenhydrAMINE (BENADRYL) 12.5 MG/5ML elixir 12.5 mg  12.5 mg Oral Q6H PRN Malen Gauze, MD       donepezil (ARICEPT) tablet 5 mg  5 mg Oral QHS Malen Gauze, MD   5 mg at 08/03/23 2242   DULoxetine (CYMBALTA) DR capsule 30 mg  30 mg Oral Daily Malen Gauze, MD   30 mg at 08/04/23 0910   fentaNYL (SUBLIMAZE) injection 25-50 mcg  25-50 mcg Intravenous Q1H PRN Malen Gauze, MD   50 mcg at 08/03/23 1442   furosemide (LASIX) tablet 20 mg  20 mg Oral Daily Malen Gauze, MD   20 mg at 08/04/23 0910   HYDROcodone-acetaminophen (NORCO/VICODIN) 5-325 MG per tablet 1-2 tablet  1-2 tablet Oral Q4H PRN Malen Gauze, MD   2 tablet at 08/03/23 1733   insulin aspart (novoLOG) injection 0-15 Units  0-15 Units Subcutaneous TID WC Malen Gauze, MD   5 Units at 08/03/23 1731   insulin aspart (novoLOG) injection 0-5 Units  0-5 Units Subcutaneous QHS Malen Gauze, MD       levothyroxine (SYNTHROID) tablet 75 mcg  75 mcg Oral QAC breakfast Malen Gauze, MD   75 mcg at 08/04/23 0504   ondansetron (ZOFRAN) injection 4 mg  4 mg Intravenous Q4H PRN Malen Gauze, MD       Oral care mouth rinse  15 mL Mouth Rinse PRN McKenzie, Mardene Celeste, MD       oxybutynin (DITROPAN) tablet 5 mg  5 mg Oral Q8H PRN McKenzie, Mardene Celeste, MD       senna-docusate (Senokot-S) tablet 2 tablet  2 tablet Oral QHS Malen Gauze, MD   2 tablet at 08/03/23 2242   sodium chloride flush (NS) 0.9 % injection 3 mL  3 mL Intravenous Q12H Malen Gauze, MD   3 mL at 08/04/23 1038   sodium chloride flush (NS) 0.9 % injection 3 mL  3 mL Intravenous PRN Malen Gauze, MD       zolpidem (AMBIEN) tablet 5 mg  5 mg Oral QHS PRN [name] Mardene Celeste, MD        Allergies: Allergies  Allergen Reactions   Iron Other (See Comments)    "upset stomach"   Oxycodone Other (See Comments)    "CX HER TO FELL LIKE SHE  IS OUT OF HER BODY"   Sudafed [Pseudoephedrine Hcl] Other (See Comments)    "funny feeling"   Chlorhexidine Gluconate Rash    Past Medical History:  Diagnosis Date   Anxiety    Arthritis    Bilateral Knee DJD   Chronic pain    Chronically on opiate therapy  Clostridium difficile carrier    pt stated that she has intermittent sx   Clostridium difficile infection    Daytime somnolence    Frequent falls    Headache(784.0)    SINUS HEADACHE OCCASSIONALLY   Hypertension    Hypothyroidism    Multifactorial gait disorder    Osteoporosis    Right foot drop    Snoring    Stroke (HCC)    Tobacco use    Vertigo    Weakness    Past Surgical History:  Procedure Laterality Date   CATARACT EXTRACTION W/PHACO Right 06/28/2016   Procedure: CATARACT EXTRACTION PHACO AND INTRAOCULAR LENS PLACEMENT (IOC);  Surgeon: Jethro Bolus, MD;  Location: [facility];  Service: Ophthalmology;  Laterality: Right;  CDE: 11.10   CATARACT EXTRACTION W/PHACO Left 07/26/2016   Procedure: CATARACT EXTRACTION PHACO AND INTRAOCULAR LENS PLACEMENT (IOC);  Surgeon: Jethro Bolus, MD;  Location: [facility];  Service: Ophthalmology;  Laterality: Left;  CDE: 8.88   CYSTOSCOPY N/A 08/03/2023   Procedure: CYSTOSCOPY;  Surgeon: Malen Gauze, MD;  Location: [facility];  Service: Urology;  Laterality: N/A;   JOINT REPLACEMENT  11/08/3010   right total knee   TOTAL KNEE ARTHROPLASTY  11/07/2011   Procedure: TOTAL KNEE ARTHROPLASTY;  Surgeon: Nilda Simmer, MD;  Location: [facility];  Service: Orthopedics;  Laterality: Left;  DR Thurston Hole WANTS 90 MINUTES FOR THIS CASE   TOTAL KNEE ARTHROPLASTY Right 2011   TRANSURETHRAL RESECTION OF BLADDER TUMOR N/A 08/03/2023   Procedure: TRANSURETHRAL RESECTION OF BLADDER TUMOR  (TURBT) WITH GEMCITABINE;  Surgeon: Malen Gauze, MD;  Location: [facility];  Service: Urology;  Laterality: N/A;   TUBAL LIGATION  1982   Family History  Problem Relation Age of Onset   Heart attack Mother    COPD  Father    Arthritis Sister    Heart disease Brother    Social History   Socioeconomic History   Marital status: Married    Spouse name: Fayrene Fearing   Number of children: 4   Years of education: High Schoo   Highest education level: Not on file  Occupational History   Not on file  Tobacco Use   Smoking status: Every Day    Current packs/day: 1.00    Average packs/day: 1 pack/day for 48.0 years (48.0 ttl pk-yrs)    Types: Cigarettes   Smokeless tobacco: Never   Tobacco comments:    12/29/14 1/2 PPD  Vaping Use   Vaping status: Never Used  Substance and Sexual Activity   Alcohol use: No    Alcohol/week: 0.0 standard drinks of alcohol   Drug use: No   Sexual activity: Never    Birth control/protection: Surgical  Other Topics Concern   Not on file  Social History Narrative   1 cup of coffee a day    Social Drivers of Corporate investment banker Strain: Not on file  Food Insecurity: No Food Insecurity (08/03/2023)   Hunger Vital Sign    Worried About Running Out of Food in the Last Year: Never true    Ran Out of Food in the Last Year: Never true  Transportation Needs: No Transportation Needs (08/03/2023)   PRAPARE - Administrator, Civil Service (Medical): No    Lack of Transportation (Non-Medical): No  Physical Activity: Not on file  Stress: Not on file  Social Connections: Moderately Isolated (08/03/2023)   Social Connection and Isolation Panel [NHANES]    Frequency of Communication with Friends and  Family: More than three times a week    Frequency of Social Gatherings with Friends and Family: Never    Attends Religious Services: Never    Database administrator or Organizations: No    Attends Banker Meetings: Never    Marital Status: Married  Catering manager Violence: Not At Risk (08/03/2023)   Humiliation, Afraid, Rape, and Kick questionnaire    Fear of Current or Ex-Partner: No    Emotionally Abused: No    Physically Abused: No    Sexually  Abused: No    SUBJECTIVE  Review of Systems Constitutional: Patient denies any unintentional weight loss or change in strength lntegumentary: Patient denies any rashes or pruritus Cardiovascular: Patient denies chest pain or syncope Respiratory: Patient denies shortness of breath Gastrointestinal: Patient ***denies nausea, vomiting, constipation, or diarrhea Musculoskeletal: Patient denies muscle cramps or weakness Neurologic: Patient denies convulsions or seizures Allergic/Immunologic: Patient denies recent allergic reaction(s) Hematologic/Lymphatic: Patient denies bleeding tendencies Endocrine: Patient denies heat/cold intolerance  GU: As per HPI.  OBJECTIVE There were no vitals filed for this visit. There is no height or weight on file to calculate BMI.  Physical Examination Constitutional: No obvious distress; patient is non-toxic appearing  Cardiovascular: No visible lower extremity edema.  Respiratory: The patient does not have audible wheezing/stridor; respirations do not appear labored  Gastrointestinal: Abdomen non-distended Musculoskeletal: Normal ROM of UEs  Skin: No obvious rashes/open sores  Neurologic: CN 2-12 grossly intact Psychiatric: Answered questions appropriately with normal affect  Hematologic/Lymphatic/Immunologic: No obvious bruises or sites of spontaneous bleeding  ***GU: Catheter draining ***clear yellow urine.  UA: ***negative / *** WBC/hpf, *** RBC/hpf, *** bacteria ***Urine microscopy: ***negative / *** WBC/hpf, *** RBC/hpf, *** bacteria ***with no evidence of UTI ***with no evidence of microscopic hematuria ***otherwise unremarkable  PVR: *** ml  ASSESSMENT No diagnosis found. *** We reviewed the operative procedures and findings.  Pre-operative symptoms are *** since the procedure.   ***voiding trial?  We agreed to plan for follow up in *** months or sooner if needed. Patient verbalized understanding of and agreement with current  plan. All questions were answered.  PLAN Advised the following: Foley catheter ***discontinued. ***No follow-ups on file.  ***No global period (charge regular E&M) - Cryotherapy - Cystoscopy w/ bladder Botox - Cystoscopy w/ bladder biopsy - Cystoscopy w/ retrograde pyelogram - Cystoscopy w/ hydrodistention - Cystoscopy w/ ureteral stent placement - Cystolitholapaxy - Ureteroscopy w/ ureteral stent placement - Ureteroscopy w/ stone removal - Urolift - TURBT - Prostate biopsy  ***10 day global period: - Circumcision  ***Everything else pretty much is 90 day global period (including TURP)  No orders of the defined types were placed in this encounter.   It has been explained that the patient is to follow regularly with their PCP in addition to all other providers involved in their care and to follow instructions provided by these respective offices. Patient advised to contact urology clinic if any urologic-pertaining questions, concerns, new symptoms or problems arise in the interim period.  There are no Patient Instructions on file for this visit.  Electronically signed by:  Donnita Falls, MSN, FNP-C, CUNP 08/04/2023 1:13 PM

## 2023-08-07 ENCOUNTER — Emergency Department (HOSPITAL_COMMUNITY): Payer: 59

## 2023-08-07 ENCOUNTER — Encounter (HOSPITAL_COMMUNITY): Payer: Self-pay

## 2023-08-07 ENCOUNTER — Other Ambulatory Visit: Payer: Self-pay

## 2023-08-07 ENCOUNTER — Inpatient Hospital Stay (HOSPITAL_COMMUNITY)
Admission: EM | Admit: 2023-08-07 | Discharge: 2023-08-11 | DRG: 698 | Disposition: A | Discharge status: Home Health | Payer: 59 | Attending: Family Medicine | Admitting: Family Medicine

## 2023-08-07 DIAGNOSIS — N390 Urinary tract infection, site not specified: Secondary | ICD-10-CM | POA: Diagnosis present

## 2023-08-07 DIAGNOSIS — K8020 Calculus of gallbladder without cholecystitis without obstruction: Secondary | ICD-10-CM | POA: Diagnosis present

## 2023-08-07 DIAGNOSIS — A419 Sepsis, unspecified organism: Principal | ICD-10-CM | POA: Diagnosis present

## 2023-08-07 DIAGNOSIS — R6521 Severe sepsis with septic shock: Secondary | ICD-10-CM | POA: Diagnosis not present

## 2023-08-07 DIAGNOSIS — A4159 Other Gram-negative sepsis: Secondary | ICD-10-CM | POA: Diagnosis not present

## 2023-08-07 DIAGNOSIS — G9341 Metabolic encephalopathy: Secondary | ICD-10-CM | POA: Diagnosis present

## 2023-08-07 DIAGNOSIS — N3020 Other chronic cystitis without hematuria: Secondary | ICD-10-CM | POA: Diagnosis present

## 2023-08-07 DIAGNOSIS — T83511A Infection and inflammatory reaction due to indwelling urethral catheter, initial encounter: Principal | ICD-10-CM | POA: Diagnosis present

## 2023-08-07 DIAGNOSIS — Z96653 Presence of artificial knee joint, bilateral: Secondary | ICD-10-CM | POA: Diagnosis present

## 2023-08-07 DIAGNOSIS — I10 Essential (primary) hypertension: Secondary | ICD-10-CM | POA: Diagnosis present

## 2023-08-07 DIAGNOSIS — M4856XA Collapsed vertebra, not elsewhere classified, lumbar region, initial encounter for fracture: Secondary | ICD-10-CM | POA: Diagnosis present

## 2023-08-07 DIAGNOSIS — Z8261 Family history of arthritis: Secondary | ICD-10-CM | POA: Diagnosis not present

## 2023-08-07 DIAGNOSIS — Z743 Need for continuous supervision: Secondary | ICD-10-CM | POA: Diagnosis not present

## 2023-08-07 DIAGNOSIS — Z79890 Hormone replacement therapy (postmenopausal): Secondary | ICD-10-CM

## 2023-08-07 DIAGNOSIS — M21371 Foot drop, right foot: Secondary | ICD-10-CM | POA: Diagnosis present

## 2023-08-07 DIAGNOSIS — Z792 Long term (current) use of antibiotics: Secondary | ICD-10-CM

## 2023-08-07 DIAGNOSIS — Z79891 Long term (current) use of opiate analgesic: Secondary | ICD-10-CM

## 2023-08-07 DIAGNOSIS — F17210 Nicotine dependence, cigarettes, uncomplicated: Secondary | ICD-10-CM | POA: Diagnosis present

## 2023-08-07 DIAGNOSIS — Z96651 Presence of right artificial knee joint: Secondary | ICD-10-CM | POA: Diagnosis present

## 2023-08-07 DIAGNOSIS — K5730 Diverticulosis of large intestine without perforation or abscess without bleeding: Secondary | ICD-10-CM | POA: Diagnosis not present

## 2023-08-07 DIAGNOSIS — R269 Unspecified abnormalities of gait and mobility: Secondary | ICD-10-CM | POA: Diagnosis present

## 2023-08-07 DIAGNOSIS — N823 Fistula of vagina to large intestine: Secondary | ICD-10-CM | POA: Diagnosis present

## 2023-08-07 DIAGNOSIS — Z720 Tobacco use: Secondary | ICD-10-CM | POA: Diagnosis present

## 2023-08-07 DIAGNOSIS — Z7983 Long term (current) use of bisphosphonates: Secondary | ICD-10-CM

## 2023-08-07 DIAGNOSIS — R310 Gross hematuria: Secondary | ICD-10-CM | POA: Diagnosis present

## 2023-08-07 DIAGNOSIS — E039 Hypothyroidism, unspecified: Secondary | ICD-10-CM | POA: Diagnosis not present

## 2023-08-07 DIAGNOSIS — E119 Type 2 diabetes mellitus without complications: Secondary | ICD-10-CM | POA: Diagnosis present

## 2023-08-07 DIAGNOSIS — F0390 Unspecified dementia without behavioral disturbance: Secondary | ICD-10-CM | POA: Diagnosis present

## 2023-08-07 DIAGNOSIS — Z7982 Long term (current) use of aspirin: Secondary | ICD-10-CM

## 2023-08-07 DIAGNOSIS — J449 Chronic obstructive pulmonary disease, unspecified: Secondary | ICD-10-CM | POA: Diagnosis present

## 2023-08-07 DIAGNOSIS — Z885 Allergy status to narcotic agent status: Secondary | ICD-10-CM

## 2023-08-07 DIAGNOSIS — J9611 Chronic respiratory failure with hypoxia: Secondary | ICD-10-CM | POA: Diagnosis present

## 2023-08-07 DIAGNOSIS — D6959 Other secondary thrombocytopenia: Secondary | ICD-10-CM | POA: Diagnosis not present

## 2023-08-07 DIAGNOSIS — Z7951 Long term (current) use of inhaled steroids: Secondary | ICD-10-CM

## 2023-08-07 DIAGNOSIS — Z1152 Encounter for screening for COVID-19: Secondary | ICD-10-CM | POA: Diagnosis not present

## 2023-08-07 DIAGNOSIS — Z8249 Family history of ischemic heart disease and other diseases of the circulatory system: Secondary | ICD-10-CM

## 2023-08-07 DIAGNOSIS — G894 Chronic pain syndrome: Secondary | ICD-10-CM | POA: Diagnosis not present

## 2023-08-07 DIAGNOSIS — Z9981 Dependence on supplemental oxygen: Secondary | ICD-10-CM

## 2023-08-07 DIAGNOSIS — N3000 Acute cystitis without hematuria: Secondary | ICD-10-CM

## 2023-08-07 DIAGNOSIS — Z87440 Personal history of urinary (tract) infections: Secondary | ICD-10-CM

## 2023-08-07 DIAGNOSIS — M810 Age-related osteoporosis without current pathological fracture: Secondary | ICD-10-CM | POA: Diagnosis not present

## 2023-08-07 DIAGNOSIS — D75838 Other thrombocytosis: Secondary | ICD-10-CM | POA: Diagnosis not present

## 2023-08-07 DIAGNOSIS — E1165 Type 2 diabetes mellitus with hyperglycemia: Secondary | ICD-10-CM | POA: Diagnosis present

## 2023-08-07 DIAGNOSIS — Z221 Carrier of other intestinal infectious diseases: Secondary | ICD-10-CM

## 2023-08-07 DIAGNOSIS — B952 Enterococcus as the cause of diseases classified elsewhere: Secondary | ICD-10-CM | POA: Diagnosis not present

## 2023-08-07 DIAGNOSIS — R509 Fever, unspecified: Secondary | ICD-10-CM | POA: Diagnosis not present

## 2023-08-07 DIAGNOSIS — Z825 Family history of asthma and other chronic lower respiratory diseases: Secondary | ICD-10-CM

## 2023-08-07 DIAGNOSIS — R918 Other nonspecific abnormal finding of lung field: Secondary | ICD-10-CM | POA: Diagnosis not present

## 2023-08-07 DIAGNOSIS — Z8673 Personal history of transient ischemic attack (TIA), and cerebral infarction without residual deficits: Secondary | ICD-10-CM

## 2023-08-07 DIAGNOSIS — M1990 Unspecified osteoarthritis, unspecified site: Secondary | ICD-10-CM | POA: Diagnosis present

## 2023-08-07 DIAGNOSIS — R296 Repeated falls: Secondary | ICD-10-CM | POA: Diagnosis present

## 2023-08-07 DIAGNOSIS — I700 Atherosclerosis of aorta: Secondary | ICD-10-CM | POA: Diagnosis not present

## 2023-08-07 DIAGNOSIS — R9389 Abnormal findings on diagnostic imaging of other specified body structures: Secondary | ICD-10-CM | POA: Diagnosis not present

## 2023-08-07 DIAGNOSIS — Y846 Urinary catheterization as the cause of abnormal reaction of the patient, or of later complication, without mention of misadventure at the time of the procedure: Secondary | ICD-10-CM | POA: Diagnosis present

## 2023-08-07 DIAGNOSIS — Z79899 Other long term (current) drug therapy: Secondary | ICD-10-CM

## 2023-08-07 DIAGNOSIS — Z888 Allergy status to other drugs, medicaments and biological substances status: Secondary | ICD-10-CM

## 2023-08-07 DIAGNOSIS — N3080 Other cystitis without hematuria: Secondary | ICD-10-CM | POA: Diagnosis not present

## 2023-08-07 DIAGNOSIS — R531 Weakness: Secondary | ICD-10-CM | POA: Diagnosis not present

## 2023-08-07 DIAGNOSIS — B9689 Other specified bacterial agents as the cause of diseases classified elsewhere: Secondary | ICD-10-CM | POA: Diagnosis not present

## 2023-08-07 LAB — URINALYSIS, W/ REFLEX TO CULTURE (INFECTION SUSPECTED)
Bilirubin Urine: NEGATIVE
Glucose, UA: NEGATIVE mg/dL
Ketones, ur: NEGATIVE mg/dL
Nitrite: NEGATIVE
Protein, ur: >=300 mg/dL — AB
RBC / HPF: >50 RBC/hpf (ref 0–5)
Specific Gravity, Urine: 1.022 (ref 1.005–1.030)
WBC, UA: >50 WBC/hpf (ref 0–5)
pH: 5 (ref 5.0–8.0)

## 2023-08-07 LAB — COMPREHENSIVE METABOLIC PANEL WITH GFR
ALT: 23 U/L (ref 0–44)
AST: 23 U/L (ref 15–41)
Albumin: 3.2 g/dL — ABNORMAL LOW (ref 3.5–5.0)
Alkaline Phosphatase: 65 U/L (ref 38–126)
Anion gap: 10 (ref 5–15)
BUN: 22 mg/dL (ref 8–23)
CO2: 25 mmol/L (ref 22–32)
Calcium: 9 mg/dL (ref 8.9–10.3)
Chloride: 102 mmol/L (ref 98–111)
Creatinine, Ser: 1.45 mg/dL — ABNORMAL HIGH (ref 0.44–1.00)
GFR, Estimated: 37 mL/min — ABNORMAL LOW
Glucose, Bld: 158 mg/dL — ABNORMAL HIGH (ref 70–99)
Potassium: 3.5 mmol/L (ref 3.5–5.1)
Sodium: 137 mmol/L (ref 135–145)
Total Bilirubin: 1 mg/dL (ref 0.0–1.2)
Total Protein: 6.7 g/dL (ref 6.5–8.1)

## 2023-08-07 LAB — BLOOD GAS, VENOUS
Acid-Base Excess: 4.4 mmol/L — ABNORMAL HIGH (ref 0.0–2.0)
Bicarbonate: 28.4 mmol/L — ABNORMAL HIGH (ref 20.0–28.0)
Drawn by: 4252
O2 Saturation: 98.4 %
Patient temperature: 37.5
pCO2, Ven: 40 mm[Hg] — ABNORMAL LOW (ref 44–60)
pH, Ven: 7.46 — ABNORMAL HIGH (ref 7.25–7.43)
pO2, Ven: 77 mm[Hg] — ABNORMAL HIGH (ref 32–45)

## 2023-08-07 LAB — CBC WITH DIFFERENTIAL/PLATELET
Abs Immature Granulocytes: 0.06 K/uL (ref 0.00–0.07)
Basophils Absolute: 0 K/uL (ref 0.0–0.1)
Basophils Relative: 0 %
Eosinophils Absolute: 0 K/uL (ref 0.0–0.5)
Eosinophils Relative: 0 %
HCT: 33.2 % — ABNORMAL LOW (ref 36.0–46.0)
Hemoglobin: 10.9 g/dL — ABNORMAL LOW (ref 12.0–15.0)
Immature Granulocytes: 1 %
Lymphocytes Relative: 5 %
Lymphs Abs: 0.4 K/uL — ABNORMAL LOW (ref 0.7–4.0)
MCH: 30 pg (ref 26.0–34.0)
MCHC: 32.8 g/dL (ref 30.0–36.0)
MCV: 91.5 fL (ref 80.0–100.0)
Monocytes Absolute: 0.1 K/uL (ref 0.1–1.0)
Monocytes Relative: 1 %
Neutro Abs: 7.5 K/uL (ref 1.7–7.7)
Neutrophils Relative %: 93 %
Platelets: 178 K/uL (ref 150–400)
RBC: 3.63 MIL/uL — ABNORMAL LOW (ref 3.87–5.11)
RDW: 14.6 % (ref 11.5–15.5)
WBC: 8 K/uL (ref 4.0–10.5)
nRBC: 0 % (ref 0.0–0.2)

## 2023-08-07 LAB — RESP PANEL BY RT-PCR (RSV, FLU A&B, COVID)  RVPGX2
Influenza A by PCR: NEGATIVE
Influenza B by PCR: NEGATIVE
Resp Syncytial Virus by PCR: NEGATIVE
SARS Coronavirus 2 by RT PCR: NEGATIVE

## 2023-08-07 LAB — LACTIC ACID, PLASMA
Lactic Acid, Venous: 0.8 mmol/L (ref 0.5–1.9)
Lactic Acid, Venous: 1.1 mmol/L (ref 0.5–1.9)

## 2023-08-07 MED ORDER — LACTATED RINGERS IV BOLUS
1000.0000 mL | Freq: Once | INTRAVENOUS | Status: AC
  Administered 2023-08-07: 1000 mL via INTRAVENOUS

## 2023-08-07 MED ORDER — SODIUM CHLORIDE 0.9 % IV SOLN
2.0000 g | Freq: Once | INTRAVENOUS | Status: AC
Start: 2023-08-07 — End: 2023-08-07
  Administered 2023-08-07: 2 g via INTRAVENOUS
  Filled 2023-08-07: qty 12.5

## 2023-08-07 MED ORDER — IOHEXOL 300 MG/ML  SOLN
75.0000 mL | Freq: Once | INTRAMUSCULAR | Status: AC | PRN
  Administered 2023-08-07: 75 mL via INTRAVENOUS

## 2023-08-07 MED ORDER — SODIUM CHLORIDE 0.9 % IV BOLUS
500.0000 mL | Freq: Once | INTRAVENOUS | Status: AC
  Administered 2023-08-07: 500 mL via INTRAVENOUS

## 2023-08-07 MED ORDER — NOREPINEPHRINE 4 MG/250ML-% IV SOLN
2.0000 ug/min | INTRAVENOUS | Status: DC
  Administered 2023-08-07: 4 ug/min via INTRAVENOUS
  Filled 2023-08-07: qty 250

## 2023-08-07 MED ORDER — LACTATED RINGERS IV BOLUS: 1000.0000 mL | Freq: Once | INTRAVENOUS | Status: DC

## 2023-08-07 MED ORDER — METRONIDAZOLE 500 MG/100ML IV SOLN
500.0000 mg | Freq: Once | INTRAVENOUS | Status: AC
  Administered 2023-08-07: 500 mg via INTRAVENOUS
  Filled 2023-08-07: qty 100

## 2023-08-07 MED ORDER — SODIUM CHLORIDE 0.9 % IV SOLN
250.0000 mL | INTRAVENOUS | Status: DC
  Administered 2023-08-07: 250 mL via INTRAVENOUS

## 2023-08-07 MED ORDER — SODIUM CHLORIDE 0.9 % IV BOLUS
1000.0000 mL | Freq: Once | INTRAVENOUS | Status: AC
  Administered 2023-08-07: 1000 mL via INTRAVENOUS

## 2023-08-07 MED ORDER — SODIUM CHLORIDE 0.9 % IV SOLN
1.0000 g | Freq: Once | INTRAVENOUS | Status: DC
  Administered 2023-08-08: 1 g via INTRAVENOUS
  Filled 2023-08-07: qty 1000

## 2023-08-07 MED ORDER — AMPICILLIN SODIUM 1 G IJ SOLR
1.0000 g | INTRAMUSCULAR | Status: DC
  Filled 2023-08-07: qty 1000

## 2023-08-07 NOTE — ED Provider Notes (Signed)
[place] EMERGENCY DEPARTMENT AT [facility] Provider Note   CSN: [id number] Arrival date & time: 08/07/23  2024     History {Add pertinent medical, surgical, social history, OB history to HPI:1} Chief Complaint  Patient presents with   Fever   Fatigue    Symphani EMBERLEIGH THAPA is a 78 y.o. female.  78 year old female with a history of bladder tumor s/p resection on 08/03/2023, COPD on 3 L nasal cannula, tobacco use, diabetes, and hypothyroidism who presents emergency department with fever and altered mental status.  Initial history obtained per EMS.  Patient had a bladder tumor removed on the 16th.  Sent home with Foley catheter.  Has had fevers off and on for the past 2 days and has been more drowsy than usual.  Additional history obtained per her daughter.  Says that the patient was at home with her stepfather.  Says that she had a temperature of 101F before coming into the emergency department.  Is on Bactrim for frequent UTIs.  Denies any other infectious symptoms.       Home Medications Prior to Admission medications   Medication Sig Start Date End Date Taking? Authorizing Provider  ACCU-CHEK GUIDE test strip USE TEST STRIP TO CHECK GLUCOSE TWICE DAILY BEFORE BREAKFAST AND  BEFORE BEDTIME 02/27/23   Dani Gobble, NP  Accu-Chek Softclix Lancets lancets USE  TO CHECK GLUCOSE TWICE DAILY 04/18/23   Dani Gobble, NP  acetaminophen (TYLENOL) 500 MG tablet Take 1,000 mg by mouth every 6 (six) hours as needed for mild pain.    [provider]  alendronate (FOSAMAX) 70 MG tablet Take 70 mg by mouth every Sunday. 03/18/18   [provider]  ALPRAZolam Prudy Feeler) 1 MG tablet Take 1 mg by mouth at bedtime. 04/22/18   [provider]  aspirin 81 MG EC tablet Take 81 mg by mouth in the morning. 03/01/18   [provider]  atorvastatin (LIPITOR) 20 MG tablet Take 20 mg by mouth daily at 12 noon. 02/11/18   [provider]  bismuth  subsalicylate (PEPTO BISMOL) 262 MG/15ML suspension Take 30 mLs by mouth every 6 (six) hours as needed for indigestion.    [provider]  blood glucose meter kit and supplies Dispense based on patient and insurance preference. Use up to four times daily as directed. (FOR ICD-10 E10.9, E11.9). 03/24/21   Amin, Ankit C, MD  Cholecalciferol (VITAMIN D3) 50 MCG (2000 UT) capsule Take 2,000 Units by mouth daily.    [provider]  donepezil (ARICEPT) 5 MG tablet Take 5 mg by mouth at bedtime. 02/20/23   [provider]  DULoxetine (CYMBALTA) 30 MG capsule Take 30 mg by mouth daily. 06/20/23   [provider]  furosemide (LASIX) 20 MG tablet Take 20 mg by mouth daily.    [provider]  HYDROcodone-acetaminophen (NORCO) 10-325 MG tablet Take 1 tablet by mouth every 4 (four) hours as needed for moderate pain. 12/02/20   [provider]  hydrOXYzine (ATARAX) 25 MG tablet Take 25 mg by mouth every 6 (six) hours as needed for itching. 06/20/23   [provider]  levothyroxine (SYNTHROID) 75 MCG tablet Take 1 tablet (75 mcg total) by mouth daily before breakfast. 02/27/23   Dani Gobble, NP  Menthol-Methyl Salicylate (MUSCLE RUB EX) Apply 1 Application topically daily as needed (pain).    [provider]  OXYGEN Inhale 1 L/min into the lungs daily.    [provider]  polyethylene glycol (MIRALAX) 17 g packet Take 17 g by mouth daily. Patient taking differently: Take 17 g by mouth daily as needed for moderate constipation. 04/30/23   Kommor, [place], MD  SANTYL 250 UNIT/GM ointment Apply 1 Application topically daily. 05/22/23   [provider]  sulfamethoxazole-trimethoprim (BACTRIM) 400-80 MG tablet Take 1 tablet by mouth at bedtime. 06/20/23   [provider]  traMADol (ULTRAM) 50 MG tablet Take 1 tablet (50 mg total) by mouth every 6 (six) hours as needed. 08/04/23 08/03/24  Malen Gauze, MD  TRELEGY  ELLIPTA 200-62.5-25 MCG/ACT AEPB Inhale 1 puff into the lungs daily. 04/18/23   [provider]      Allergies    Iron, Oxycodone, Sudafed [pseudoephedrine hcl], and Chlorhexidine gluconate    Review of Systems   Review of Systems  Physical Exam Updated Vital Signs BP (!) 98/51   Pulse 91   Temp 99.5 F (37.5 C) (Oral)   Resp 20   SpO2 90%  Physical Exam Vitals and nursing note reviewed.  Constitutional:      General: She is not in acute distress.    Appearance: She is well-developed.     Comments: On 3 L nasal cannula.  Alert and oriented x 2 (thought the year was 2005)  HENT:     Head: Normocephalic and atraumatic.     Right Ear: External ear normal.     Left Ear: External ear normal.     Nose: Nose normal.  Eyes:     Extraocular Movements: Extraocular movements intact.     Conjunctiva/sclera: Conjunctivae normal.     Pupils: Pupils are equal, round, and reactive to light.  Cardiovascular:     Rate and Rhythm: Normal rate and regular rhythm.     Heart sounds: No murmur heard. Pulmonary:     Effort: Pulmonary effort is normal. No respiratory distress.     Breath sounds: Normal breath sounds.  Abdominal:     General: Abdomen is flat. There is no distension.     Palpations: Abdomen is soft. There is no mass.     Tenderness: There is abdominal tenderness (Minimal, diffuse). There is no guarding.  Musculoskeletal:     Cervical back: Normal range of motion and neck supple.     Right lower leg: No edema.     Left lower leg: No edema.  Skin:    General: Skin is warm and dry.  Neurological:     Mental Status: She is alert.     Comments: Moving all 4 extremities but generally weak.  Cranial nerves II through XII grossly intact  Psychiatric:        Mood and Affect: Mood normal.     ED Results / Procedures / Treatments   Labs (all labs ordered are listed, but only abnormal results are displayed) Labs Reviewed - No data to display  EKG None  Radiology No  results found.  Procedures Procedures  {Document cardiac monitor, telemetry assessment procedure when appropriate:1}  Medications Ordered in ED Medications - No data to display  ED Course/ Medical Decision Making/ A&P   {   Click here for ABCD2, HEART and other calculatorsREFRESH Note before signing :1}                              Medical Decision Making Amount and/or Complexity of Data Reviewed Labs: ordered. Radiology: ordered.   ***  {Document critical care time when  appropriate:1} {Document review of labs and clinical decision tools ie heart score, Chads2Vasc2 etc:1}  {Document your independent review of radiology images, and any outside records:1} {Document your discussion with family members, caretakers, and with consultants:1} {Document social determinants of health affecting pt's care:1} {Document your decision making why or why not admission, treatments were needed:1} Final Clinical Impression(s) / ED Diagnoses Final diagnoses:  None    Rx / DC Orders ED Discharge Orders     None

## 2023-08-07 NOTE — ED Triage Notes (Signed)
Pt had surgery x few days ago to remove tumor from bladder. Pt was catheter placed. Pt has become more lethargic over the last few days and has had fevers at home. Pt able to answer questions and A&O x4.   Temp 99.5

## 2023-08-07 NOTE — ED Notes (Signed)
Patient transported to CT 

## 2023-08-07 NOTE — ED Notes (Signed)
EDP at bedside  

## 2023-08-08 ENCOUNTER — Telehealth: Payer: Self-pay | Admitting: Urology

## 2023-08-08 DIAGNOSIS — D75838 Other thrombocytosis: Secondary | ICD-10-CM | POA: Diagnosis present

## 2023-08-08 DIAGNOSIS — I10 Essential (primary) hypertension: Secondary | ICD-10-CM | POA: Diagnosis present

## 2023-08-08 DIAGNOSIS — A419 Sepsis, unspecified organism: Principal | ICD-10-CM | POA: Diagnosis present

## 2023-08-08 DIAGNOSIS — N823 Fistula of vagina to large intestine: Secondary | ICD-10-CM | POA: Diagnosis present

## 2023-08-08 DIAGNOSIS — N3080 Other cystitis without hematuria: Secondary | ICD-10-CM | POA: Diagnosis not present

## 2023-08-08 DIAGNOSIS — M4856XA Collapsed vertebra, not elsewhere classified, lumbar region, initial encounter for fracture: Secondary | ICD-10-CM | POA: Diagnosis present

## 2023-08-08 DIAGNOSIS — D6959 Other secondary thrombocytopenia: Secondary | ICD-10-CM | POA: Diagnosis present

## 2023-08-08 DIAGNOSIS — Z96653 Presence of artificial knee joint, bilateral: Secondary | ICD-10-CM | POA: Diagnosis present

## 2023-08-08 DIAGNOSIS — T83511A Infection and inflammatory reaction due to indwelling urethral catheter, initial encounter: Secondary | ICD-10-CM | POA: Diagnosis present

## 2023-08-08 DIAGNOSIS — R509 Fever, unspecified: Secondary | ICD-10-CM | POA: Diagnosis present

## 2023-08-08 DIAGNOSIS — B952 Enterococcus as the cause of diseases classified elsewhere: Secondary | ICD-10-CM | POA: Diagnosis not present

## 2023-08-08 DIAGNOSIS — G894 Chronic pain syndrome: Secondary | ICD-10-CM | POA: Diagnosis present

## 2023-08-08 DIAGNOSIS — Z1152 Encounter for screening for COVID-19: Secondary | ICD-10-CM | POA: Diagnosis not present

## 2023-08-08 DIAGNOSIS — A4159 Other Gram-negative sepsis: Secondary | ICD-10-CM | POA: Diagnosis present

## 2023-08-08 DIAGNOSIS — M21371 Foot drop, right foot: Secondary | ICD-10-CM | POA: Diagnosis present

## 2023-08-08 DIAGNOSIS — E039 Hypothyroidism, unspecified: Secondary | ICD-10-CM | POA: Diagnosis present

## 2023-08-08 DIAGNOSIS — E119 Type 2 diabetes mellitus without complications: Secondary | ICD-10-CM | POA: Diagnosis present

## 2023-08-08 DIAGNOSIS — J9611 Chronic respiratory failure with hypoxia: Secondary | ICD-10-CM | POA: Diagnosis present

## 2023-08-08 DIAGNOSIS — B9689 Other specified bacterial agents as the cause of diseases classified elsewhere: Secondary | ICD-10-CM | POA: Diagnosis not present

## 2023-08-08 DIAGNOSIS — N390 Urinary tract infection, site not specified: Secondary | ICD-10-CM | POA: Diagnosis not present

## 2023-08-08 DIAGNOSIS — R6521 Severe sepsis with septic shock: Secondary | ICD-10-CM | POA: Diagnosis present

## 2023-08-08 DIAGNOSIS — Z9981 Dependence on supplemental oxygen: Secondary | ICD-10-CM | POA: Diagnosis not present

## 2023-08-08 DIAGNOSIS — F0390 Unspecified dementia without behavioral disturbance: Secondary | ICD-10-CM | POA: Diagnosis present

## 2023-08-08 DIAGNOSIS — G9341 Metabolic encephalopathy: Secondary | ICD-10-CM | POA: Diagnosis present

## 2023-08-08 DIAGNOSIS — Y846 Urinary catheterization as the cause of abnormal reaction of the patient, or of later complication, without mention of misadventure at the time of the procedure: Secondary | ICD-10-CM | POA: Diagnosis present

## 2023-08-08 DIAGNOSIS — Z8261 Family history of arthritis: Secondary | ICD-10-CM | POA: Diagnosis not present

## 2023-08-08 DIAGNOSIS — M810 Age-related osteoporosis without current pathological fracture: Secondary | ICD-10-CM | POA: Diagnosis present

## 2023-08-08 DIAGNOSIS — M1990 Unspecified osteoarthritis, unspecified site: Secondary | ICD-10-CM | POA: Diagnosis present

## 2023-08-08 DIAGNOSIS — J449 Chronic obstructive pulmonary disease, unspecified: Secondary | ICD-10-CM | POA: Diagnosis present

## 2023-08-08 DIAGNOSIS — R269 Unspecified abnormalities of gait and mobility: Secondary | ICD-10-CM | POA: Diagnosis present

## 2023-08-08 DIAGNOSIS — F17210 Nicotine dependence, cigarettes, uncomplicated: Secondary | ICD-10-CM | POA: Diagnosis present

## 2023-08-08 LAB — BASIC METABOLIC PANEL
Anion gap: 8 (ref 5–15)
BUN: 20 mg/dL (ref 8–23)
CO2: 20 mmol/L — ABNORMAL LOW (ref 22–32)
Calcium: 7.7 mg/dL — ABNORMAL LOW (ref 8.9–10.3)
Chloride: 110 mmol/L (ref 98–111)
Creatinine, Ser: 1.21 mg/dL — ABNORMAL HIGH (ref 0.44–1.00)
GFR, Estimated: 46 mL/min — ABNORMAL LOW (ref >60)
Glucose, Bld: 169 mg/dL — ABNORMAL HIGH (ref 70–99)
Potassium: 3.5 mmol/L (ref 3.5–5.1)
Sodium: 138 mmol/L (ref 135–145)

## 2023-08-08 LAB — GLUCOSE, CAPILLARY
Glucose-Capillary: 103 mg/dL — ABNORMAL HIGH (ref 70–99)
Glucose-Capillary: 108 mg/dL — ABNORMAL HIGH (ref 70–99)
Glucose-Capillary: 176 mg/dL — ABNORMAL HIGH (ref 70–99)
Glucose-Capillary: 99 mg/dL (ref 70–99)

## 2023-08-08 LAB — CBC
HCT: 33.6 % — ABNORMAL LOW (ref 36.0–46.0)
Hemoglobin: 10.6 g/dL — ABNORMAL LOW (ref 12.0–15.0)
MCH: 29.7 pg (ref 26.0–34.0)
MCHC: 31.5 g/dL (ref 30.0–36.0)
MCV: 94.1 fL (ref 80.0–100.0)
Platelets: 159 10*3/uL (ref 150–400)
RBC: 3.57 MIL/uL — ABNORMAL LOW (ref 3.87–5.11)
RDW: 14.7 % (ref 11.5–15.5)
WBC: 7.4 10*3/uL (ref 4.0–10.5)
nRBC: 0 % (ref 0.0–0.2)

## 2023-08-08 LAB — T4, FREE: Free T4: 1.31 ng/dL — ABNORMAL HIGH (ref 0.61–1.12)

## 2023-08-08 LAB — TSH: TSH: 0.872 u[IU]/mL (ref 0.350–4.500)

## 2023-08-08 LAB — MRSA NEXT GEN BY PCR, NASAL: MRSA by PCR Next Gen: DETECTED — AB

## 2023-08-08 LAB — MAGNESIUM: Magnesium: 2 mg/dL (ref 1.7–2.4)

## 2023-08-08 LAB — PROTIME-INR
INR: 1.3 — ABNORMAL HIGH (ref 0.8–1.2)
Prothrombin Time: 16 s — ABNORMAL HIGH (ref 11.4–15.2)

## 2023-08-08 LAB — PHOSPHORUS: Phosphorus: 2.3 mg/dL — ABNORMAL LOW (ref 2.5–4.6)

## 2023-08-08 MED ORDER — INSULIN ASPART 100 UNIT/ML IJ SOLN
0.0000 [IU] | Freq: Three times a day (TID) | INTRAMUSCULAR | Status: DC
  Administered 2023-08-08: 1 [IU] via SUBCUTANEOUS

## 2023-08-08 MED ORDER — POLYETHYLENE GLYCOL 3350 17 G PO PACK: 17.0000 g | PACK | Freq: Every day | ORAL | Status: DC | PRN

## 2023-08-08 MED ORDER — SODIUM CHLORIDE 0.9 % IV SOLN
250.0000 mL | INTRAVENOUS | Status: AC
  Administered 2023-08-08: 250 mL via INTRAVENOUS

## 2023-08-08 MED ORDER — MELATONIN 3 MG PO TABS
6.0000 mg | ORAL_TABLET | Freq: Every evening | ORAL | Status: DC | PRN
  Administered 2023-08-09 – 2023-08-10 (×2): 6 mg via ORAL
  Filled 2023-08-08 (×2): qty 2

## 2023-08-08 MED ORDER — DONEPEZIL HCL 5 MG PO TABS
5.0000 mg | ORAL_TABLET | Freq: Every day | ORAL | Status: DC
  Administered 2023-08-08 – 2023-08-10 (×4): 5 mg via ORAL
  Filled 2023-08-08 (×4): qty 1

## 2023-08-08 MED ORDER — LACTATED RINGERS IV SOLN: INTRAVENOUS | Status: AC

## 2023-08-08 MED ORDER — LEVOTHYROXINE SODIUM 75 MCG PO TABS
75.0000 ug | ORAL_TABLET | Freq: Every day | ORAL | Status: DC
  Administered 2023-08-08 – 2023-08-11 (×4): 75 ug via ORAL
  Filled 2023-08-08 (×4): qty 1

## 2023-08-08 MED ORDER — DULOXETINE HCL 30 MG PO CPEP
30.0000 mg | ORAL_CAPSULE | Freq: Every day | ORAL | Status: DC
  Administered 2023-08-08 – 2023-08-11 (×4): 30 mg via ORAL
  Filled 2023-08-08 (×4): qty 1

## 2023-08-08 MED ORDER — FLUTICASONE FUROATE-VILANTEROL 200-25 MCG/ACT IN AEPB
1.0000 | INHALATION_SPRAY | Freq: Every day | RESPIRATORY_TRACT | Status: DC
  Administered 2023-08-08 – 2023-08-11 (×4): 1 via RESPIRATORY_TRACT
  Filled 2023-08-08 (×2): qty 28

## 2023-08-08 MED ORDER — NOREPINEPHRINE 4 MG/250ML-% IV SOLN
2.0000 ug/min | INTRAVENOUS | Status: DC
  Administered 2023-08-08: 4 ug/min via INTRAVENOUS
  Administered 2023-08-09 – 2023-08-10 (×2): 2 ug/min via INTRAVENOUS
  Filled 2023-08-08: qty 250

## 2023-08-08 MED ORDER — PIPERACILLIN-TAZOBACTAM 3.375 G IVPB
3.3750 g | Freq: Three times a day (TID) | INTRAVENOUS | Status: DC
Start: 2023-08-08 — End: 2023-08-10
  Administered 2023-08-08 – 2023-08-10 (×7): 3.375 g via INTRAVENOUS
  Filled 2023-08-08 (×7): qty 50

## 2023-08-08 MED ORDER — NICOTINE 7 MG/24HR TD PT24
7.0000 mg | MEDICATED_PATCH | Freq: Every day | TRANSDERMAL | Status: DC
  Administered 2023-08-09: 7 mg via TRANSDERMAL
  Filled 2023-08-08: qty 1

## 2023-08-08 MED ORDER — SODIUM CHLORIDE 0.9% FLUSH
3.0000 mL | Freq: Two times a day (BID) | INTRAVENOUS | Status: DC
  Administered 2023-08-08 – 2023-08-11 (×7): 3 mL via INTRAVENOUS

## 2023-08-08 MED ORDER — UMECLIDINIUM BROMIDE 62.5 MCG/ACT IN AEPB
1.0000 | INHALATION_SPRAY | Freq: Every day | RESPIRATORY_TRACT | Status: DC
  Administered 2023-08-08 – 2023-08-11 (×4): 1 via RESPIRATORY_TRACT
  Filled 2023-08-08 (×2): qty 7

## 2023-08-08 MED ORDER — MUPIROCIN 2 % EX OINT
TOPICAL_OINTMENT | Freq: Two times a day (BID) | CUTANEOUS | Status: DC
  Administered 2023-08-08: 1 via NASAL
  Filled 2023-08-08 (×2): qty 22

## 2023-08-08 MED ORDER — ATORVASTATIN CALCIUM 40 MG PO TABS
20.0000 mg | ORAL_TABLET | Freq: Every day | ORAL | Status: DC
  Administered 2023-08-08 – 2023-08-11 (×4): 20 mg via ORAL
  Filled 2023-08-08 (×4): qty 1

## 2023-08-08 MED ORDER — ACETAMINOPHEN 500 MG PO TABS
1000.0000 mg | ORAL_TABLET | Freq: Four times a day (QID) | ORAL | Status: DC | PRN
  Administered 2023-08-08 – 2023-08-09 (×2): 1000 mg via ORAL
  Filled 2023-08-08 (×2): qty 2

## 2023-08-08 NOTE — Discharge Summary (Signed)
Physician Discharge Summary  Patient ID: Amanda Armstrong MRN: [id number] DOB/AGE: Oct 03, 1945 78 y.o.  Admit date: 08/03/2023 Discharge date: 08/04/2023  Admission Diagnoses:  Bladder tumor  Discharge Diagnoses:  Principal Problem:   Bladder tumor   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    pt stated that she has intermittent sx   Clostridium difficile infection    Daytime somnolence    Frequent falls    Headache(784.0)    SINUS HEADACHE OCCASSIONALLY   Hypertension    Hypothyroidism    Multifactorial gait disorder    Osteoporosis    Right foot drop    Snoring    Stroke (HCC)    Tobacco use    Vertigo    Weakness     Surgeries: Procedure(s): TRANSURETHRAL RESECTION OF BLADDER TUMOR  (TURBT) WITH GEMCITABINE CYSTOSCOPY on 08/03/2023   Consultants (if any):   Discharged Condition: Improved  Hospital Course: Amanda Armstrong is an 78 y.o. female who was admitted 08/03/2023 with a diagnosis of Bladder tumor and went to the operating room on 08/03/2023 and underwent the above named procedures.    She was given perioperative antibiotics:  Anti-infectives (From admission, onward)    Start     Dose/Rate Route Frequency Ordered Stop   08/03/23 1145  ceFAZolin (ANCEF) IVPB 2g/100 mL premix        2 g 200 mL/hr over 30 Minutes Intravenous 30 min pre-op 08/03/23 1047 08/03/23 1159   08/03/23 1049  ceFAZolin (ANCEF) 2-4 GM/100ML-% IVPB       Note to Pharmacy: Daphane Shepherd H: cabinet override      08/03/23 1049 08/03/23 1205     .  She was given sequential compression devices, early ambulation for DVT prophylaxis.  She benefited maximally from the hospital stay and there were no complications.    Recent vital signs:  Vitals:   08/03/23 2037 08/04/23 0500  BP: 98/67 (!) 103/58  Pulse: 64 (!) 54  Resp: 20   Temp: 98.6 F (37 C) 98.4 F  (36.9 C)  SpO2: 95% 95%    Recent laboratory studies:  Lab Results  Component Value Date   HGB 10.6 (L) 08/08/2023   HGB 10.9 (L) 08/07/2023   HGB 13.0 05/06/2023   Lab Results  Component Value Date   WBC 7.4 08/08/2023   PLT 159 08/08/2023   Lab Results  Component Value Date   INR 1.3 (H) 08/08/2023   Lab Results  Component Value Date   NA 138 08/08/2023   K 3.5 08/08/2023   CL 110 08/08/2023   CO2 20 (L) 08/08/2023   BUN 20 08/08/2023   CREATININE 1.21 (H) 08/08/2023   GLUCOSE 169 (H) 08/08/2023    Discharge Medications:   Allergies as of 08/04/2023       Reactions   Iron Other (See Comments)   "upset stomach"   Oxycodone Other (See Comments)   "CX HER TO FELL LIKE SHE IS OUT OF HER BODY"   Sudafed [pseudoephedrine Hcl] Other (See Comments)   "funny feeling"   Chlorhexidine Gluconate Rash        Medication List     TAKE these medications    Accu-Chek Guide test strip Generic drug: glucose blood USE TEST STRIP TO CHECK GLUCOSE TWICE DAILY BEFORE BREAKFAST AND  BEFORE BEDTIME   Accu-Chek  Softclix Lancets lancets USE  TO CHECK GLUCOSE TWICE DAILY   acetaminophen 500 MG tablet Commonly known as: TYLENOL Take 1,000 mg by mouth every 6 (six) hours as needed for mild pain.   alendronate 70 MG tablet Commonly known as: FOSAMAX Take 70 mg by mouth every Sunday.   ALPRAZolam 1 MG tablet Commonly known as: XANAX Take 1 mg by mouth at bedtime.   aspirin EC 81 MG tablet Take 81 mg by mouth in the morning.   atorvastatin 20 MG tablet Commonly known as: LIPITOR Take 20 mg by mouth daily at 12 noon.   bismuth subsalicylate 262 MG/15ML suspension Commonly known as: PEPTO BISMOL Take 30 mLs by mouth every 6 (six) hours as needed for indigestion.   blood glucose meter kit and supplies Dispense based on patient and insurance preference. Use up to four times daily as directed. (FOR ICD-10 E10.9, E11.9).   donepezil 5 MG tablet Commonly known as:  ARICEPT Take 5 mg by mouth at bedtime.   DULoxetine 30 MG capsule Commonly known as: CYMBALTA Take 30 mg by mouth daily.   furosemide 20 MG tablet Commonly known as: LASIX Take 20 mg by mouth daily.   HYDROcodone-acetaminophen 10-325 MG tablet Commonly known as: NORCO Take 1 tablet by mouth every 4 (four) hours as needed for moderate pain.   hydrOXYzine 25 MG tablet Commonly known as: ATARAX Take 25 mg by mouth every 6 (six) hours as needed for itching.   levothyroxine 75 MCG tablet Commonly known as: SYNTHROID Take 1 tablet (75 mcg total) by mouth daily before breakfast.   MUSCLE RUB EX Apply 1 Application topically daily as needed (pain).   OXYGEN Inhale 1 L/min into the lungs daily.   polyethylene glycol 17 g packet Commonly known as: MiraLax Take 17 g by mouth daily. What changed:  when to take this reasons to take this   Santyl 250 UNIT/GM ointment Generic drug: collagenase Apply 1 Application topically daily.   sulfamethoxazole-trimethoprim 400-80 MG tablet Commonly known as: BACTRIM Take 1 tablet by mouth at bedtime.   traMADol 50 MG tablet Commonly known as: Ultram Take 1 tablet (50 mg total) by mouth every 6 (six) hours as needed.   Trelegy Ellipta 200-62.5-25 MCG/ACT Aepb Generic drug: Fluticasone-Umeclidin-Vilant Inhale 1 puff into the lungs daily.   Vitamin D3 50 MCG (2000 UT) capsule Take 2,000 Units by mouth daily.        Diagnostic Studies: CT ABDOMEN PELVIS W CONTRAST Result Date: 08/07/2023 CLINICAL DATA:  Abdominal pain, sepsis EXAM: CT ABDOMEN AND PELVIS WITH CONTRAST TECHNIQUE: Multidetector CT imaging of the abdomen and pelvis was performed using the standard protocol following bolus administration of intravenous contrast. RADIATION DOSE REDUCTION: This exam was performed according to the departmental dose-optimization program which includes automated exposure control, adjustment of the mA and/or kV according to patient size and/or use  of iterative reconstruction technique. CONTRAST:  75mL OMNIPAQUE IOHEXOL 300 MG/ML  SOLN COMPARISON:  05/06/2023 FINDINGS: Lower chest: Bibasilar atelectasis. Coronary artery and aortic atherosclerosis. Hepatobiliary: Gallstones fill the gallbladder. No CT evidence of acute cholecystitis. No biliary ductal dilatation or focal hepatic abnormality. Pancreas: No focal abnormality or ductal dilatation. Spleen: No focal abnormality.  Normal size. Adrenals/Urinary Tract: Stable left adrenal nodule most compatible with adenoma. Right adrenal gland unremarkable. No stones or hydronephrosis. Small scattered bilateral renal cysts. No follow-up imaging recommended. Urinary bladder decompressed with Foley catheter in place. Bladder wall appears mildly thickened but difficult to evaluate due to decompressed state. Stomach/Bowel:  Sigmoid diverticulosis. No active diverticulitis. Moderate stool burden throughout the colon. Stomach and small bowel decompressed, unremarkable. Vascular/Lymphatic: Aortoiliac atherosclerosis. No evidence of aneurysm or adenopathy. Reproductive: Calcified structure in the left adnexa is unchanged since prior study. Gas again noted within the vagina, similar to prior study. Other: No free fluid or free air. Musculoskeletal: Progressive compression fracture at L2, now moderate. No acute bony abnormality. IMPRESSION: Cholelithiasis.  No CT evidence of acute cholecystitis. Bibasilar atelectasis. Gas again seen within the vagina as seen on prior study. Cannot exclude rectovaginal fistula. Bladder wall appears thickened, but bladder is decompressed and difficult evaluate. Recommend clinical correlation to exclude cystitis. Sigmoid diverticulosis.  Moderate stool burden throughout the colon. Aortic atherosclerosis. Electronically Signed   By: Charlett Nose M.D.   On: 08/07/2023 23:09   DG Chest Portable 1 View Result Date: 08/07/2023 CLINICAL DATA:  Fever. Surgery to remove tumor from bladder a few days ago  EXAM: PORTABLE CHEST 1 VIEW COMPARISON:  05/06/2023 FINDINGS: Stable cardiomediastinal silhouette. Aortic atherosclerotic calcification. Chronic elevation of the right hemidiaphragm. Diffuse interstitial prominence and peribronchial cuffing similar to prior. No focal consolidation, pleural effusion, or pneumothorax. IMPRESSION: Diffuse interstitial prominence and peribronchial cuffing similar to prior, likely bronchitis. Electronically Signed   By: Minerva Fester M.D.   On: 08/07/2023 21:36    Disposition: Discharge disposition: 01-Home or Self Care       Discharge Instructions     Discharge patient   Complete by: As directed    Discharge disposition: 01-Home or Self Care   Discharge patient date: 08/04/2023        Follow-up Information     Karyn Brull, Mardene Celeste, MD. Call in 1 week(s).   Specialty: Urology Contact information: [address]  [place] [address] [phone]                  Signed: Wilkie Aye 08/08/2023, 7:57 AM

## 2023-08-08 NOTE — Progress Notes (Signed)
   08/08/23 0948  TOC Brief Assessment  Insurance and Status Reviewed  Patient has primary care physician Yes  Home environment has been reviewed Home with family  Prior level of function: indpendent  Prior/Current Home Services No current home services  Social Drivers of Health Review SDOH reviewed no interventions necessary  Readmission risk has been reviewed Yes  Transition of care needs no transition of care needs at this time   Patient in ICU, Supportive family. Needs 2-3 days work up for discharge pending.  Transition of Care Department [facility]) has reviewed patient and no TOC needs have been identified at this time. We will continue to monitor patient advancement through interdisciplinary progression rounds. If new patient transition needs arise, please place a TOC consult.

## 2023-08-08 NOTE — Progress Notes (Signed)
ASSUMPTION OF CARE NOTE   08/08/2023 12:14 PM  Amanda Armstrong was seen and examined.  The H&P by the admitting provider, orders, imaging was reviewed.  Please see new orders.  Will continue to follow.  I notified Dr. [name] of admission and he said he would come to see patient in hospital. She seems to be improving with current treatments.  Continue for now. Wean IV norepinephrine as able.   Vitals:   08/08/23 1115 08/08/23 1124  BP: (!) 103/56   Pulse: 66   Resp: (!) 23   Temp:  98.5 F (36.9 C)  SpO2: 100%     Results for orders placed or performed during the hospital encounter of 08/07/23  Resp panel by RT-PCR (RSV, Flu A&B, Covid) Anterior Nasal Swab   Collection Time: 08/07/23  8:49 PM   Specimen: Anterior Nasal Swab  Result Value Ref Range   SARS Coronavirus 2 by RT PCR NEGATIVE NEGATIVE   Influenza A by PCR NEGATIVE NEGATIVE   Influenza B by PCR NEGATIVE NEGATIVE   Resp Syncytial Virus by PCR NEGATIVE NEGATIVE  Blood Culture (routine x 2)   Collection Time: 08/07/23  8:51 PM   Specimen: BLOOD  Result Value Ref Range   Specimen Description BLOOD BLOOD RIGHT HAND    Special Requests      BOTTLES DRAWN AEROBIC AND ANAEROBIC Blood Culture adequate volume   Culture      NO GROWTH < 12 HOURS Performed at [facility], [address], [place], [place] [address]    Report Status PENDING   Lactic acid, plasma   Collection Time: 08/07/23  8:51 PM  Result Value Ref Range   Lactic Acid, Venous 0.8 0.5 - 1.9 mmol/L  Comprehensive metabolic panel   Collection Time: 08/07/23  8:51 PM  Result Value Ref Range   Sodium 137 135 - 145 mmol/L   Potassium 3.5 3.5 - 5.1 mmol/L   Chloride 102 98 - 111 mmol/L   CO2 25 22 - 32 mmol/L   Glucose, Bld 158 (H) 70 - 99 mg/dL   BUN 22 8 - 23 mg/dL   Creatinine, Ser 6.04 (H) 0.44 - 1.00 mg/dL   Calcium 9.0 8.9 - 54.0 mg/dL   Total Protein 6.7 6.5 - 8.1 g/dL   Albumin 3.2 (L) 3.5 - 5.0 g/dL   AST 23 15 - 41 U/L   ALT 23 0 - 44 U/L    Alkaline Phosphatase 65 38 - 126 U/L   Total Bilirubin 1.0 0.0 - 1.2 mg/dL   GFR, Estimated 37 (L) >60 mL/min   Anion gap 10 5 - 15  CBC with Differential   Collection Time: 08/07/23  8:51 PM  Result Value Ref Range   WBC 8.0 4.0 - 10.5 K/uL   RBC 3.63 (L) 3.87 - 5.11 MIL/uL   Hemoglobin 10.9 (L) 12.0 - 15.0 g/dL   HCT 98.1 (L) 19.1 - 47.8 %   MCV 91.5 80.0 - 100.0 fL   MCH 30.0 26.0 - 34.0 pg   MCHC 32.8 30.0 - 36.0 g/dL   RDW 29.5 62.1 - 30.8 %   Platelets 178 150 - 400 K/uL   nRBC 0.0 0.0 - 0.2 %   Neutrophils Relative % 93 %   Neutro Abs 7.5 1.7 - 7.7 K/uL   Lymphocytes Relative 5 %   Lymphs Abs 0.4 (L) 0.7 - 4.0 K/uL   Monocytes Relative 1 %   Monocytes Absolute 0.1 0.1 - 1.0 K/uL   Eosinophils  Relative 0 %   Eosinophils Absolute 0.0 0.0 - 0.5 K/uL   Basophils Relative 0 %   Basophils Absolute 0.0 0.0 - 0.1 K/uL   Immature Granulocytes 1 %   Abs Immature Granulocytes 0.06 0.00 - 0.07 K/uL  Blood Culture (routine x 2)   Collection Time: 08/07/23  9:32 PM   Specimen: BLOOD  Result Value Ref Range   Specimen Description BLOOD LEFT ANTECUBITAL    Special Requests      BOTTLES DRAWN AEROBIC AND ANAEROBIC Blood Culture adequate volume   Culture      NO GROWTH < 12 HOURS Performed at [facility], [address], [place], [place] [address]    Report Status PENDING   Blood gas, venous   Collection Time: 08/07/23  9:32 PM  Result Value Ref Range   pH, Ven 7.46 (H) 7.25 - 7.43   pCO2, Ven 40 (L) 44 - 60 mmHg   pO2, Ven 77 (H) 32 - 45 mmHg   Bicarbonate 28.4 (H) 20.0 - 28.0 mmol/L   Acid-Base Excess 4.4 (H) 0.0 - 2.0 mmol/L   O2 Saturation 98.4 %   Patient temperature 37.5    Collection site LEFT ANTECUBITAL    Drawn by 1914   T4, free   Collection Time: 08/07/23  9:32 PM  Result Value Ref Range   Free T4 1.31 (H) 0.61 - 1.12 ng/dL  Lactic acid, plasma   Collection Time: 08/07/23 10:31 PM  Result Value Ref Range   Lactic Acid, Venous 1.1 0.5 - 1.9 mmol/L  TSH    Collection Time: 08/07/23 10:31 PM  Result Value Ref Range   TSH 0.872 0.350 - 4.500 uIU/mL  Urinalysis, w/ Reflex to Culture (Infection Suspected) -Urine, Catheterized; Indwelling urinary catheter   Collection Time: 08/07/23 11:10 PM  Result Value Ref Range   Specimen Source URINE, CLEAN CATCH    Color, Urine AMBER (A) YELLOW   APPearance CLOUDY (A) CLEAR   Specific Gravity, Urine 1.022 1.005 - 1.030   pH 5.0 5.0 - 8.0   Glucose, UA NEGATIVE NEGATIVE mg/dL   Hgb urine dipstick LARGE (A) NEGATIVE   Bilirubin Urine NEGATIVE NEGATIVE   Ketones, ur NEGATIVE NEGATIVE mg/dL   Protein, ur >=782 (A) NEGATIVE mg/dL   Nitrite NEGATIVE NEGATIVE   Leukocytes,Ua MODERATE (A) NEGATIVE   RBC / HPF >50 0 - 5 RBC/hpf   WBC, UA >50 0 - 5 WBC/hpf   Bacteria, UA FEW (A) NONE SEEN   Squamous Epithelial / HPF 11-20 0 - 5 /HPF   WBC Clumps PRESENT    Mucus PRESENT   MRSA Next Gen by PCR, Nasal   Collection Time: 08/08/23  1:42 AM   Specimen: Nasal Mucosa; Nasal Swab  Result Value Ref Range   MRSA by PCR Next Gen DETECTED (A) NOT DETECTED  Basic metabolic panel   Collection Time: 08/08/23  4:19 AM  Result Value Ref Range   Sodium 138 135 - 145 mmol/L   Potassium 3.5 3.5 - 5.1 mmol/L   Chloride 110 98 - 111 mmol/L   CO2 20 (L) 22 - 32 mmol/L   Glucose, Bld 169 (H) 70 - 99 mg/dL   BUN 20 8 - 23 mg/dL   Creatinine, Ser 9.56 (H) 0.44 - 1.00 mg/dL   Calcium 7.7 (L) 8.9 - 10.3 mg/dL   GFR, Estimated 46 (L) >60 mL/min   Anion gap 8 5 - 15  CBC   Collection Time: 08/08/23  4:19 AM  Result Value  Ref Range   WBC 7.4 4.0 - 10.5 K/uL   RBC 3.57 (L) 3.87 - 5.11 MIL/uL   Hemoglobin 10.6 (L) 12.0 - 15.0 g/dL   HCT 16.1 (L) 09.6 - 04.5 %   MCV 94.1 80.0 - 100.0 fL   MCH 29.7 26.0 - 34.0 pg   MCHC 31.5 30.0 - 36.0 g/dL   RDW 40.9 81.1 - 91.4 %   Platelets 159 150 - 400 K/uL   nRBC 0.0 0.0 - 0.2 %  Magnesium   Collection Time: 08/08/23  4:19 AM  Result Value Ref Range   Magnesium 2.0 1.7 - 2.4 mg/dL   Phosphorus   Collection Time: 08/08/23  4:19 AM  Result Value Ref Range   Phosphorus 2.3 (L) 2.5 - 4.6 mg/dL  Protime-INR   Collection Time: 08/08/23  4:19 AM  Result Value Ref Range   Prothrombin Time 16.0 (H) 11.4 - 15.2 seconds   INR 1.3 (H) 0.8 - 1.2  Glucose, capillary   Collection Time: 08/08/23  7:46 AM  Result Value Ref Range   Glucose-Capillary 108 (H) 70 - 99 mg/dL  Glucose, capillary   Collection Time: 08/08/23 11:40 AM  Result Value Ref Range   Glucose-Capillary 103 (H) 70 - 99 mg/dL   Comment 1 Notify RN    Comment 2 Document in Walker Kehr, MD [facility]   08/07/2023  8:25 PM How to contact the [facility] Attending or Consulting provider 7A - 7P or covering provider during after hours 7P -7A, for this patient?  Check the care team in [facility] and look for a) attending/consulting TRH provider listed and b) the [facility] team listed Log into [email or web address] and use [place]'s universal password to access. If you do not have the password, please contact the hospital operator. Locate the [facility] provider you are looking for under [facility] and page to a number that you can be directly reached. If you still have difficulty reaching the provider, please page the [facility] (Director on Call) for the Hospitalists listed on amion for assistance.

## 2023-08-08 NOTE — TOC Initial Note (Signed)
Transition of Care [facility]) - Initial/Assessment Note    Patient Details  Name: Amanda Armstrong MRN: [id number] Date of Birth: August 19, 1945  [facility]) CM/SW Contact:    Leitha Bleak, RN Phone Number: 08/08/2023, 2:54 PM  Clinical Narrative:   Patient admitted with  Septic shock. CM at beside to meet with husband and daughter to discuss PT eval. Patient lives a home with her spouse. She has a CAPs aide M-F all day and half and day on Sunday. Additional family members assist on Saturday.  They will for patient to return home when stable with HHPT. She has been in SNF in the past, they did not see any improvement. She does better at home. MD aware to order HHPT. CM spoke with her Katrina her CAPs aide case Production designer, theatre/television/film. To given an update and make them aware of discharge plan. She will follow- up and have aids on standby to meet patient at home when discharged.      Expected Discharge Plan: Home w Home Health Services Barriers to Discharge: Continued Medical Work up   Patient Goals and CMS Choice Patient states their goals for this hospitalization and ongoing recovery are:: to return home. CMS Medicare.gov Compare Post Acute Care list provided to:: Patient Choice offered to / list presented to : Patient      Expected Discharge Plan and Services       Living arrangements for the past 2 months: Single Family Home                           HH Arranged: PT   Date HH Agency Contacted: 08/08/23 Time HH Agency Contacted: 1451 Representative spoke with at [facility] Agency: Katrina ( CAPS program social work)  Prior Living Arrangements/Services Living arrangements for the past 2 months: Single Family Home Lives with:: Spouse, Other (Comment) (aide) Patient language and need for interpreter reviewed:: Yes Do you feel safe going back to the place where you live?: Yes      Need for Family Participation in Patient Care: Yes (Comment)     Criminal Activity/Legal Involvement  Pertinent to Current Situation/Hospitalization: No - Comment as needed  Activities of Daily Living   ADL Screening (condition at time of admission) Independently performs ADLs?: No Does the patient have a NEW difficulty with bathing/dressing/toileting/self-feeding that is expected to last >3 days?: Yes (Initiates electronic notice to provider for possible OT consult) Does the patient have a NEW difficulty with getting in/out of bed, walking, or climbing stairs that is expected to last >3 days?: Yes (Initiates electronic notice to provider for possible PT consult) Does the patient have a NEW difficulty with communication that is expected to last >3 days?: No Is the patient deaf or have difficulty hearing?: No Does the patient have difficulty seeing, even when wearing glasses/contacts?: No Does the patient have difficulty concentrating, remembering, or making decisions?: No  Permission Sought/Granted            Permission granted to share info w Relationship: Daughter     Emotional Assessment   Attitude/Demeanor/Rapport: Engaged Affect (typically observed): Accepting Orientation: : Oriented to Self, Oriented to Place, Oriented to  Time, Oriented to Situation Alcohol / Substance Use: Not Applicable Psych Involvement: No (comment)  Admission diagnosis:  Septic shock (HCC) [A41.9, R65.21] Patient Active Problem List   Diagnosis Date Noted   Septic shock (HCC) 08/08/2023   Bladder tumor 08/03/2023   AKI (acute kidney injury) (HCC) 07/04/2022  Type 2 diabetes mellitus (HCC) 07/04/2022   COPD (chronic obstructive pulmonary disease) (HCC) 07/04/2022   Chronic hypoxic respiratory failure, on home oxygen therapy (HCC) 07/04/2022   Tobacco use 07/04/2022   Acute cystitis 07/04/2022   Acute respiratory failure with hypoxia (HCC) 07/03/2022   COPD GOLD ? / group B still smoking  05/21/2021   Chronic respiratory failure with hypoxia (HCC) 05/21/2021   Uncontrolled type 2 diabetes  mellitus with hyperglycemia (HCC) 03/23/2021   Insomnia 03/23/2021   Chronic pain syndrome 03/22/2021   Recurrent falls 03/22/2021   Vertigo 03/22/2021   Multifactorial gait disorder 03/22/2021   Right foot drop 03/22/2021   Hyperosmolar hyperglycemic state (HHS) (HCC) 03/22/2021   Unspecified protein-calorie malnutrition (HCC) 08/07/2020   Altered mental status    Cigarette smoker 08/04/2020   Acute metabolic encephalopathy 08/04/2020   Acute renal failure superimposed on stage 3b chronic kidney disease (HCC) 08/04/2020   COVID-19 08/03/2020   History of falling 10/31/2018   Long term (current) use of opiate analgesic 09/05/2018   Hyperlipidemia, unspecified 07/18/2017   Age-related osteoporosis without current pathological fracture 07/18/2017   UTI (urinary tract infection) 01/29/2016   Nicotine dependence 09/07/2015   Enteritis due to Clostridium difficile 08/29/2013   Weakness 08/02/2013   Diarrhea 08/02/2013   Acute pyelonephritis 05/28/2013   Lower urinary tract infectious disease 05/27/2013   Tachypnea 05/27/2013   Hyperglycemia 05/27/2013   Arthritis    Anxiety    Hypothyroidism    PCP:  [name], MD Pharmacy:   [facility] [address] [place] - [address] [place] [address] Phone: [phone] Fax: [phone]  [facility] [place], [place] - [address] [place] [address] Phone: [phone] Fax: [phone]     Social Drivers of Health (SDOH) Social History: SDOH Screenings   Food Insecurity: No Food Insecurity (08/08/2023)  Housing: Low Risk  (08/08/2023)  Transportation Needs: No Transportation Needs (08/08/2023)  Utilities: Not At Risk (08/08/2023)  Social Connections: Moderately Isolated (08/08/2023)  Tobacco Use: High Risk (08/07/2023)   SDOH Interventions:     Readmission Risk Interventions    08/08/2023    2:54 PM 03/24/2021    1:36 PM  Readmission Risk  Prevention Plan  Post Dischage Appt  Complete  Medication Screening  Complete  Transportation Screening Complete Complete  PCP or Specialist Appt within 5-7 Days Not Complete   Home Care Screening Complete   Medication Review (RN CM) Complete

## 2023-08-08 NOTE — Plan of Care (Signed)

## 2023-08-08 NOTE — H&P (Signed)
History and Physical    Amanda Armstrong XBJ:[id number] DOB: 10/07/1945 DOA: 08/07/2023  PCP: [name], MD   Patient coming from: Home   Chief Complaint:  Chief Complaint  Patient presents with   Fever   Fatigue    HPI:  Amanda Armstrong is a 78 y.o. 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  History provided by patient's daughter at the bedside due to patient's somnolence.  Daughter reports that over the last few days has been more confused, today disoriented to situation, time.  She had a fever to 101F at home.  Otherwise complaining of right flank pain.  No other recent symptoms per daughter's report   Review of Systems:  ROS complete and negative except as marked above   Allergies  Allergen Reactions   Iron Other (See Comments)    "upset stomach"   Oxycodone Other (See Comments)    "CX HER TO FELL LIKE SHE IS OUT OF HER BODY"   Sudafed [Pseudoephedrine Hcl] Other (See Comments)    "funny feeling"   Chlorhexidine Gluconate Rash    Prior to Admission medications   Medication Sig Start Date End Date Taking? Authorizing Provider  ACCU-CHEK GUIDE test strip USE TEST STRIP TO CHECK GLUCOSE TWICE DAILY BEFORE BREAKFAST AND  BEFORE BEDTIME 02/27/23   Dani Gobble, NP  Accu-Chek Softclix Lancets lancets USE  TO CHECK GLUCOSE TWICE DAILY 04/18/23   Dani Gobble, NP  acetaminophen (TYLENOL) 500 MG tablet Take 1,000 mg by mouth every 6 (six) hours as needed for mild pain.    [provider]  alendronate (FOSAMAX) 70 MG tablet Take 70 mg by mouth every Sunday. 03/18/18   [provider]  ALPRAZolam Prudy Feeler) 1 MG tablet Take 1 mg by mouth at bedtime. 04/22/18   [provider]  aspirin 81 MG EC tablet Take 81 mg by mouth in the morning.  03/01/18   [provider]  atorvastatin (LIPITOR) 20 MG tablet Take 20 mg by mouth daily at 12 noon. 02/11/18   [provider]  bismuth subsalicylate (PEPTO BISMOL) 262 MG/15ML suspension Take 30 mLs by mouth every 6 (six) hours as needed for indigestion.    [provider]  blood glucose meter kit and supplies Dispense based on patient and insurance preference. Use up to four times daily as directed. (FOR ICD-10 E10.9, E11.9). 03/24/21   Amin, Ankit C, MD  Cholecalciferol (VITAMIN D3) 50 MCG (2000 UT) capsule Take 2,000 Units by mouth daily.    [provider]  donepezil (ARICEPT) 5 MG tablet Take 5 mg by mouth at bedtime. 02/20/23   [provider]  DULoxetine (CYMBALTA) 30 MG capsule Take 30 mg by mouth daily. 06/20/23   [provider]  furosemide (LASIX) 20 MG tablet Take 20 mg by mouth daily.    [provider]  HYDROcodone-acetaminophen (NORCO) 10-325 MG tablet Take 1 tablet by mouth every 4 (four) hours as needed for moderate pain. 12/02/20   [provider]  hydrOXYzine (ATARAX) 25 MG tablet Take 25 mg by mouth every 6 (six) hours as needed for itching. 06/20/23   [provider]  levothyroxine (SYNTHROID) 75 MCG tablet Take 1 tablet (75 mcg total) by mouth daily before breakfast. 02/27/23   Dani Gobble, NP  Menthol-Methyl Salicylate (MUSCLE RUB EX) Apply 1 Application topically daily as needed (pain).    [provider]  OXYGEN Inhale 1 L/min into the lungs daily.    [provider]  polyethylene glycol (MIRALAX) 17 g packet Take 17 g by mouth daily. Patient taking differently: Take 17 g by mouth daily as needed for moderate constipation. 04/30/23   Kommor, [place], MD  SANTYL 250 UNIT/GM ointment Apply 1 Application topically daily. 05/22/23   [provider]  sulfamethoxazole-trimethoprim (BACTRIM) 400-80 MG tablet Take 1 tablet by mouth at bedtime. 06/20/23   [provider]   traMADol (ULTRAM) 50 MG tablet Take 1 tablet (50 mg total) by mouth every 6 (six) hours as needed. 08/04/23 08/03/24  Malen Gauze, MD  TRELEGY ELLIPTA 200-62.5-25 MCG/ACT AEPB Inhale 1 puff into the lungs daily. 04/18/23   [provider]    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    pt stated that she has intermittent sx   Clostridium difficile infection    Daytime somnolence    Frequent falls    Headache(784.0)    SINUS HEADACHE OCCASSIONALLY   Hypertension    Hypothyroidism    Multifactorial gait disorder    Osteoporosis    Right foot drop    Snoring    Stroke (HCC)    Tobacco use    Vertigo    Weakness     Past Surgical History:  Procedure Laterality Date   CATARACT EXTRACTION W/PHACO Right 06/28/2016   Procedure: CATARACT EXTRACTION PHACO AND INTRAOCULAR LENS PLACEMENT (IOC);  Surgeon: Jethro Bolus, MD;  Location: [facility];  Service: Ophthalmology;  Laterality: Right;  CDE: 11.10   CATARACT EXTRACTION W/PHACO Left 07/26/2016   Procedure: CATARACT EXTRACTION PHACO AND INTRAOCULAR LENS PLACEMENT (IOC);  Surgeon: Jethro Bolus, MD;  Location: [facility];  Service: Ophthalmology;  Laterality: Left;  CDE: 8.88   CYSTOSCOPY N/A 08/03/2023   Procedure: CYSTOSCOPY;  Surgeon: Malen Gauze, MD;  Location: [facility];  Service: Urology;  Laterality: N/A;   JOINT REPLACEMENT  11/08/3010   right total knee   TOTAL KNEE ARTHROPLASTY  11/07/2011   Procedure: TOTAL KNEE ARTHROPLASTY;  Surgeon: Nilda Simmer, MD;  Location: [facility];  Service: Orthopedics;  Laterality: Left;  DR Thurston Hole WANTS 90 MINUTES FOR THIS CASE   TOTAL KNEE ARTHROPLASTY Right 2011   TRANSURETHRAL RESECTION OF BLADDER TUMOR N/A 08/03/2023   Procedure: TRANSURETHRAL RESECTION OF BLADDER TUMOR  (TURBT) WITH GEMCITABINE;  Surgeon: Malen Gauze, MD;  Location: [facility];  Service:  Urology;  Laterality: N/A;   TUBAL LIGATION  1982     reports that she has been smoking cigarettes. She has a 48 pack-year smoking history. She has never used smokeless tobacco. She reports that she does not drink alcohol and does not use drugs.  Family History  Problem Relation Age of Onset   Heart attack Mother    COPD Father    Arthritis Sister    Heart disease Brother      Physical Exam: Vitals:   08/08/23 0123 08/08/23 0135 08/08/23 0140 08/08/23 0145  BP: 121/60 138/63 (!) 147/61 (!) 145/59  Pulse: (!) 54 (!) 50 (!) 51 (!) 53  Resp: 20 19 20 20   Temp:      TempSrc:      SpO2: 96%  99% 98% 98%    Gen: Somnolent, awakens to light nonpainful stimulation then remains briefly awake before falling back asleep, chronically ill-appearing CV: Regular, normal S1, S2, no murmurs  Resp: Normal WOB, on 2 L O2, scattered wheezes Abd: Obese, slightly distended, normoactive, there is moderate diffuse tenderness, positive mild rebound GU Foley with amber-colored urine, tiny millimeter clot MSK: Symmetric, trace edema Skin: No rashes or lesions to exposed skin  Neuro: Somnolent, awakens to light nonpainful stimulation then remains briefly awake before falling back asleep, able to follow commands, oriented, no focal deficits Psych: Unable to assess due to somnolence   Data review:   Labs reviewed, notable for:   Lactate 0.8 -> 1.1 VBG 7.46/40, bicarb 25, Creatinine 1.45 near baseline WBC 8 Hemoglobin 10.9, down from 13 UA with few bacteria, pyuria, hematuria, positive leuk negative nitrite.  Positive squamous cells was obtained after exchanging Foley  Micro:  Results for orders placed or performed during the hospital encounter of 08/07/23  Resp panel by RT-PCR (RSV, Flu A&B, Covid) Anterior Nasal Swab     Status: None   Collection Time: 08/07/23  8:49 PM   Specimen: Anterior Nasal Swab  Result Value Ref Range Status   SARS Coronavirus 2 by RT PCR NEGATIVE NEGATIVE Final     Comment: (NOTE) SARS-CoV-2 target nucleic acids are NOT DETECTED.  The SARS-CoV-2 RNA is generally detectable in upper respiratory specimens during the acute phase of infection. The lowest concentration of SARS-CoV-2 viral copies this assay can detect is 138 copies/mL. A negative result does not preclude SARS-Cov-2 infection and should not be used as the sole basis for treatment or other patient management decisions. A negative result may occur with  improper specimen collection/handling, submission of specimen other than nasopharyngeal swab, presence of viral mutation(s) within the areas targeted by this assay, and inadequate number of viral copies(<138 copies/mL). A negative result must be combined with clinical observations, patient history, and epidemiological information. The expected result is Negative.  Fact Sheet for Patients:  BloggerCourse.com  Fact Sheet for Healthcare Providers:  SeriousBroker.it  This test is no t yet approved or cleared by the [place] FDA and  has been authorized for detection and/or diagnosis of SARS-CoV-2 by FDA under an Emergency Use Authorization (EUA). This EUA will remain  in effect (meaning this test can be used) for the duration of the COVID-19 declaration under Section 564(b)(1) of the Act, 21 U.S.C.section 360bbb-3(b)(1), unless the authorization is terminated  or revoked sooner.       Influenza A by PCR NEGATIVE NEGATIVE Final   Influenza B by PCR NEGATIVE NEGATIVE Final    Comment: (NOTE) The Xpert Xpress SARS-CoV-2/FLU/RSV plus assay is intended as an aid in the diagnosis of influenza from Nasopharyngeal swab specimens and should not be used as a sole basis for treatment. Nasal washings and aspirates are unacceptable for Xpert Xpress SARS-CoV-2/FLU/RSV testing.  Fact Sheet for Patients: BloggerCourse.com  Fact Sheet for Healthcare  Providers: SeriousBroker.it  This test is not yet approved or cleared by the [place] FDA and has been authorized for detection and/or diagnosis of SARS-CoV-2 by FDA under an Emergency Use Authorization (EUA). This EUA will remain in effect (meaning this test can be used) for the duration of the COVID-19 declaration under Section 564(b)(1) of the Act, 21 U.S.C. section 360bbb-3(b)(1), unless the authorization is terminated or revoked.     Resp Syncytial Virus by PCR NEGATIVE NEGATIVE Final    Comment: (NOTE) Fact Sheet for Patients: BloggerCourse.com  Fact Sheet for Healthcare Providers: SeriousBroker.it  This test is not yet approved or cleared by the [place] FDA and has been authorized for detection and/or diagnosis of SARS-CoV-2 by FDA under an Emergency Use Authorization (EUA). This EUA will remain in effect (meaning this test can be used) for the duration of the COVID-19 declaration under Section 564(b)(1) of the Act, 21 U.S.C. section 360bbb-3(b)(1), unless the authorization is terminated or revoked.  Performed at [facility], [address], [place], [place] [address]   Blood Culture (routine x 2)     Status: None (Preliminary result)   Collection Time: 08/07/23  8:51 PM   Specimen: BLOOD  Result Value Ref Range Status   Specimen Description BLOOD BLOOD RIGHT HAND  Final   Special Requests   Final    BOTTLES DRAWN AEROBIC AND ANAEROBIC Blood Culture adequate volume Performed at [facility], [address], [place], [place] [address]    Culture PENDING  Incomplete   Report Status PENDING  Incomplete  Blood Culture (routine x 2)     Status: None (Preliminary result)   Collection Time: 08/07/23  9:32 PM   Specimen: BLOOD  Result Value Ref Range Status   Specimen Description BLOOD LEFT ANTECUBITAL  Final   Special Requests   Final    BOTTLES DRAWN AEROBIC AND ANAEROBIC Blood Culture  adequate volume Performed at [facility], [address], [place], [place] [address]    Culture PENDING  Incomplete   Report Status PENDING  Incomplete    Imaging reviewed:  CT ABDOMEN PELVIS W CONTRAST Result Date: 08/07/2023 CLINICAL DATA:  Abdominal pain, sepsis EXAM: CT ABDOMEN AND PELVIS WITH CONTRAST TECHNIQUE: Multidetector CT imaging of the abdomen and pelvis was performed using the standard protocol following bolus administration of intravenous contrast. RADIATION DOSE REDUCTION: This exam was performed according to the departmental dose-optimization program which includes automated exposure control, adjustment of the mA and/or kV according to patient size and/or use of iterative reconstruction technique. CONTRAST:  75mL OMNIPAQUE IOHEXOL 300 MG/ML  SOLN COMPARISON:  05/06/2023 FINDINGS: Lower chest: Bibasilar atelectasis. Coronary artery and aortic atherosclerosis. Hepatobiliary: Gallstones fill the gallbladder. No CT evidence of acute cholecystitis. No biliary ductal dilatation or focal hepatic abnormality. Pancreas: No focal abnormality or ductal dilatation. Spleen: No focal abnormality.  Normal size. Adrenals/Urinary Tract: Stable left adrenal nodule most compatible with adenoma. Right adrenal gland unremarkable. No stones or hydronephrosis. Small scattered bilateral renal cysts. No follow-up imaging recommended. Urinary bladder decompressed with Foley catheter in place. Bladder wall appears mildly thickened but difficult to evaluate due to decompressed state. Stomach/Bowel: Sigmoid diverticulosis. No active diverticulitis. Moderate stool burden throughout the colon. Stomach and small bowel decompressed, unremarkable. Vascular/Lymphatic: Aortoiliac atherosclerosis. No evidence of aneurysm or adenopathy. Reproductive: Calcified structure in the left adnexa is unchanged since prior study. Gas again noted within the vagina, similar to prior study. Other: No free fluid or free air. Musculoskeletal:  Progressive compression fracture at L2, now moderate. No acute bony abnormality. IMPRESSION: Cholelithiasis.  No CT evidence of acute cholecystitis. Bibasilar atelectasis. Gas again seen within the vagina as seen on prior study. Cannot exclude rectovaginal fistula. Bladder wall appears thickened, but bladder is decompressed and difficult evaluate. Recommend clinical correlation to exclude cystitis. Sigmoid diverticulosis.  Moderate stool burden throughout the colon. Aortic atherosclerosis. Electronically Signed   By: Charlett Nose M.D.   On: 08/07/2023 23:09   DG Chest Portable 1 View Result Date: 08/07/2023 CLINICAL DATA:  Fever. Surgery to remove tumor from  bladder a few days ago EXAM: PORTABLE CHEST 1 VIEW COMPARISON:  05/06/2023 FINDINGS: Stable cardiomediastinal silhouette. Aortic atherosclerotic calcification. Chronic elevation of the right hemidiaphragm. Diffuse interstitial prominence and peribronchial cuffing similar to prior. No focal consolidation, pleural effusion, or pneumothorax. IMPRESSION: Diffuse interstitial prominence and peribronchial cuffing similar to prior, likely bronchitis. Electronically Signed   By: Minerva Fester M.D.   On: 08/07/2023 21:36   Historical data: Micro review:  History VRE amp sensitive History E. coli amp resistant CoNS  Citrobacter cefazolin resistant   ED Course:  Treated with 3 L IV fluid, due to post persistent hypotension started on Levophed.  Treated with cefepime, Flagyl.  Had been planned to switch to ampicillin due to a recent VRE amp sensitive however I recommended for Zosyn instead for broader coverage.  EDP discussed her case with PCCM downtown, with her low Levophed requirements felt okay to stay here to [facility], I agree appears appropriate for ICU here.   Assessment/Plan:  78 y.o. female with hx recent TURBT on 1/16 discharged with Foley, recurrent UTI on suppressive antibiotics with Bactrim, CVA, dementia, COPD, CHRF on 2 l O2,  hypertension, diabetes, hypothyroidism, who was brought in from home due to recent AMS and fever.  Found to have septic shock with source likely outpatient catheter associated UTI.   CAUTI, outpatient Foley, complicated Sepsis, with septic shock, present on admission, ongoing -likely secondary to above History TURBT on 1/16 discharged with Foley in place.  Few days of AMS and 1 day of fever at home.  On initial evaluation in the ED became hypotensive to 70s over 40s, tachypneic.  She received large-volume fluid resuscitation but did not responsive and started on Levophed.  WBC 8, lactate 1.1.  UA obtained after exchanging Foley slightly contaminated although given clinical history observed, few bacteria, pyuria suspect true UTI.  Chest x-ray with interstitial prominence and peribronchial cuffing.  CT abdomen pelvis with thickened bladder wall, no other acute findings.  -Status post cefepime, switch to Zosyn to cover for her most recent isolate which was amp sensitive VRE (although in ' 22); brief micro review is included in data review above. -Status post 3 L IV fluid, persistent hypotension.  Hold off on additional fluids -Continue norepinephrine gtt  -Follow-up blood cultures, urine culture  S/p TURBT 1/6, with hematuria / tiny clots  Foley exchanged in the ED. TURBT done by Dr. [name]  -Strict I's and O's, notify provider if any clots or sudden drop in urine output. - Will need to reach out to the urology morning, contacted overnight  Encephalopathy, related to sepsis/hypoperfusion Worsening mental status in the ED with disorientation and progressive somnolence.  Improved after initiation of vasopressors. - Management of sepsis per above - Delirium precautions, fall precautions - Hold her home opiates, hydroxyzine, Xanax for now given degree of somnolence.  Will need to reinitiate once more awake but favor slightly reduced doses  Acute anemia Baseline hemoglobin around 13, down to 10.9 on  admission.  Does not have gross hematuria although may have small component of blood loss. Likely acute inflammatory change contributing  -Monitor CBC, coag, type and screen  Smoking cessation - Not addressed due to her mental status, will need counseling on cessation.  Per daughter smoking less than half pack per day. - Nicotine patch  Incidental findings: Progressive L2 compression fracture now moderate Gas within the vagina question rectovaginal fistula, seen previously Stable left adrenal nodule consistent with adenoma Calcified structure left adnexa, unchanged Cholelithiasis  Chronic  medical problems: History of CVA, dementia: Hold home aspirin in the setting of her anemia, hematuria.  This is secondary prevention for history of strokes.  Continue home donepezil COPD, CHF on 2 L O2: Without acute exacerbation: Continue home equivalent Trelegy Hypertension: Hold home Lasix, not on other antihypertensives Diabetes: Currently diet managed.  SSI while inpatient Hypothyroidism: Continue home levothyroxine History recurrent UTI: Holding home Bactrim for now while on antibiotics above.  There is no height or weight on file to calculate BMI.    DVT prophylaxis:  SCDs Code Status:  Full Code Diet:  Diet Orders (From admission, onward)     Start     Ordered   08/08/23 0047  Diet NPO time specified Except for: Ice Chips, Sips with Meds  Diet effective now       Question Answer Comment  Except for Ice Chips   Except for Sips with Meds      08/08/23 0048           Family Communication: Yes discussed with daughter Amanda Armstrong at the bedside; note daughter prefers to be first call because feels little placed too much stress on her stepfather.  She can be point of contact Consults: None Admission status:   Inpatient,  ICU  Severity of Illness: The appropriate patient status for this patient is INPATIENT. Inpatient status is judged to be reasonable and necessary in order to provide the  required intensity of service to ensure the patient's safety. The patient's presenting symptoms, physical exam findings, and initial radiographic and laboratory data in the context of their chronic comorbidities is felt to place them at high risk for further clinical deterioration. Furthermore, it is not anticipated that the patient will be medically stable for discharge from the hospital within 2 midnights of admission.   * I certify that at the point of admission it is my clinical judgment that the patient will require inpatient hospital care spanning beyond 2 midnights from the point of admission due to high intensity of service, high risk for further deterioration and high frequency of surveillance required.*   Dolly Rias, MD [facility]  How to contact the [facility] Attending or Consulting provider 7A - 7P or covering provider during after hours 7P -7A, for this patient.  Check the care team in [facility] and look for a) attending/consulting [facility] provider listed and b) the [facility] team listed Log into [email or web address] and use [place]'s universal password to access. If you do not have the password, please contact the hospital operator. Locate the [facility] provider you are looking for under [facility] and page to a number that you can be directly reached. If you still have difficulty reaching the provider, please page the [facility] (Director on Call) for the Hospitalists listed on amion for assistance.  08/08/2023, 1:51 AM

## 2023-08-08 NOTE — ED Notes (Signed)
Ampicillin stopped per MD

## 2023-08-08 NOTE — Evaluation (Signed)
Physical Therapy Evaluation Patient Details Name: Amanda Armstrong MRN: [id number] DOB: February 25, 1946 Today's Date: 08/08/2023  History of Present Illness  Amanda Armstrong is a 78 y.o. 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  History provided by patient's daughter at the bedside due to patient's somnolence.  Daughter reports that over the last few days has been more confused, today disoriented to situation, time.  She had a fever to 101F at home.  Otherwise complaining of right flank pain.  No other recent symptoms per daughter's report   Clinical Impression  Patient demonstrates fair carryover for getting into/out of bed, unsteady on feet and had loss of balance falling back into chair when taking steps at bedside, unsafe to ambulate away from bedside due to fall risk and tolerated staying up in chair after therapy.  Patient will benefit from continued skilled physical therapy in hospital and recommended venue below to increase strength, balance, endurance for safe ADLs and gait.         If plan is discharge home, recommend the following: A lot of help with bathing/dressing/bathroom;A lot of help with walking and/or transfers;Help with stairs or ramp for entrance;Assistance with cooking/housework   Can travel by private vehicle   Yes    Equipment Recommendations None recommended by PT  Recommendations for Other Services       Functional Status Assessment Patient has had a recent decline in their functional status and demonstrates the ability to make significant improvements in function in a reasonable and predictable amount of time.     Precautions / Restrictions Precautions Precautions: Fall Restrictions Weight Bearing Restrictions Per Provider Order: No      Mobility  Bed  Mobility Overal bed mobility: Needs Assistance Bed Mobility: Supine to Sit, Sit to Supine     Supine to sit: Min assist Sit to supine: Min assist   General bed mobility comments: increased time, labored movement    Transfers Overall transfer level: Needs assistance Equipment used: Rolling walker (2 wheels) Transfers: Sit to/from Stand, Bed to chair/wheelchair/BSC Sit to Stand: Min assist   Step pivot transfers: Mod assist       General transfer comment: very unsteady on feet with posterior leaning    Ambulation/Gait Ambulation/Gait assistance: Mod assist Gait Distance (Feet): 4 Feet Assistive device: Rolling walker (2 wheels) Gait Pattern/deviations: Decreased step length - right, Decreased step length - left, Decreased stride length Gait velocity: slow     General Gait Details: limited to few steps at bedside before loss of balance resulting in near fall landing into chair  Stairs            Wheelchair Mobility     Tilt Bed    Modified Rankin (Stroke Patients Only)       Balance Overall balance assessment: Needs assistance Sitting-balance support: Feet supported, No upper extremity supported Sitting balance-Leahy Scale: Fair Sitting balance - Comments: fair/good seated at EOB   Standing balance support: Reliant on assistive device for balance, During functional activity, Bilateral upper extremity supported Standing balance-Leahy Scale: Poor Standing balance comment: fair/poor using RW                             Pertinent Vitals/Pain Pain Assessment Pain Assessment: No/denies pain  Home Living Family/patient expects to be discharged to:: Private residence Living Arrangements: Spouse/significant other Available Help at Discharge: Family Type of Home: House Home Access: Ramped entrance       Home Layout: One level Home Equipment: Agricultural consultant (2 wheels);Rollator (4 wheels);BSC/3in1;Shower seat;Grab bars - toilet;Lift  chair;Grab bars - tub/shower      Prior Function Prior Level of Function : Needs assist       Physical Assist : Mobility (physical);ADLs (physical) Mobility (physical): Bed mobility;Transfers;Gait;Stairs ADLs (physical): IADLs;Bathing;Dressing Mobility Comments: RW ambulation in the home; w/c for community. ADLs Comments: Assist for bathing and dressing. Assist IADL's.     Extremity/Trunk Assessment   Upper Extremity Assessment Upper Extremity Assessment: Defer to OT evaluation    Lower Extremity Assessment Lower Extremity Assessment: Generalized weakness    Cervical / Trunk Assessment Cervical / Trunk Assessment: Normal  Communication   Communication Communication: No apparent difficulties Cueing Techniques: Verbal cues;Tactile cues  Cognition Arousal: Alert Behavior During Therapy: WFL for tasks assessed/performed Overall Cognitive Status: History of cognitive impairments - at baseline                                          General Comments      Exercises     Assessment/Plan    PT Assessment Patient needs continued PT services  PT Problem List Decreased strength;Decreased activity tolerance;Decreased balance;Decreased mobility       PT Treatment Interventions DME instruction;Gait training;Stair training;Functional mobility training;Therapeutic activities;Therapeutic exercise;Balance training;Patient/family education    PT Goals (Current goals can be found in the Care Plan section)  Acute Rehab PT Goals Patient Stated Goal: return home with family to assist PT Goal Formulation: With patient Time For Goal Achievement: 08/22/23 Potential to Achieve Goals: Good    Frequency Min 3X/week     Co-evaluation               AM-PAC PT "6 Clicks" Mobility  Outcome Measure Help needed turning from your back to your side while in a flat bed without using bedrails?: A Little Help needed moving from lying on your back to sitting on the side  of a flat bed without using bedrails?: A Little Help needed moving to and from a bed to a chair (including a wheelchair)?: A Lot Help needed standing up from a chair using your arms (e.g., wheelchair or bedside chair)?: A Lot Help needed to walk in hospital room?: A Lot Help needed climbing 3-5 steps with a railing? : A Lot 6 Click Score: 14    End of Session Equipment Utilized During Treatment: Oxygen Activity Tolerance: Patient tolerated treatment well;Patient limited by fatigue Patient left: in chair;with call bell/phone within reach Nurse Communication: Mobility status PT Visit Diagnosis: Unsteadiness on feet (R26.81);Other abnormalities of gait and mobility (R26.89);Muscle weakness (generalized) (M62.81)    Time: 1610-9604 PT Time Calculation (min) (ACUTE ONLY): 28 min   Charges:   PT Evaluation $PT Eval Moderate Complexity: 1 Mod PT Treatments $Therapeutic Activity: 23-37 mins PT General Charges $$ ACUTE PT VISIT: 1 Visit         2:15 PM, 08/08/23 Ocie Bob, MPT Physical Therapist with [facility] [phone] office [phone] mobile phone

## 2023-08-08 NOTE — Hospital Course (Addendum)
78 y.o. female with hx of recent TURBT on 1/16 discharged with Foley, recurrent UTI on suppressive antibiotics with Bactrim, CVA, dementia, COPD, CHRF on 2L/min O2, hypertension, diabetes, hypothyroidism, who was brought in from home due to recent AMS and fever.  History provided by patient's daughter at the bedside due to patient's somnolence.  Daughter reports that over the last few days has been more confused, today disoriented to situation, time.  She had a fever to 101F at home.  Otherwise complaining of right flank pain.  No other recent symptoms per patient.

## 2023-08-08 NOTE — Progress Notes (Signed)
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 Or Actions Taken?:  Standing orders for  Bactroban placed

## 2023-08-08 NOTE — Telephone Encounter (Signed)
Patient is in icu, would like the results from biopsy

## 2023-08-08 NOTE — Evaluation (Signed)
Occupational Therapy Evaluation Patient Details Name: Amanda Armstrong MRN: [id number] DOB: May 16, 1946 Today's Date: 08/08/2023   History of Present Illness Amanda Armstrong is a 78 y.o. 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  History provided by patient's daughter at the bedside due to patient's somnolence.  Daughter reports that over the last few days has been more confused, today disoriented to situation, time.  She had a fever to 101F at home.  Otherwise complaining of right flank pain.  No other recent symptoms per daughter's report (per MD)   Clinical Impression   Pt agreeable to OT evaluation. Pt reports use of RW at baseline. Today pt presented in the chair and was able to complete sit to stand a few steps away form and back to chair with min A. Mild posterior lean while ambulating with RW. B UE generally weak. Pt reported she could not doff her own socks today with limited anterior reach while seated. Pt left in the chair with call bell within reach. Pt will benefit from continued OT in the hospital and recommended venue below to increase strength, balance, and endurance for safe ADL's.          If plan is discharge home, recommend the following: A little help with walking and/or transfers;A lot of help with bathing/dressing/bathroom;Assistance with cooking/housework;Assist for transportation;Help with stairs or ramp for entrance;Direct supervision/assist for medications management    Functional Status Assessment  Patient has had a recent decline in their functional status and demonstrates the ability to make significant improvements in function in a reasonable and predictable amount of time.  Equipment Recommendations  None recommended by OT           Precautions /  Restrictions Precautions Precautions: Fall Restrictions Weight Bearing Restrictions Per Provider Order: No      Mobility Bed Mobility               General bed mobility comments: Pt seated in the chair at the start of the session.    Transfers Overall transfer level: Needs assistance Equipment used: Rolling walker (2 wheels) Transfers: Sit to/from Stand Sit to Stand: Min assist           General transfer comment: Able to complete sit to stand with min A from chair using RW. Mild posterior leaning.      Balance Overall balance assessment: Needs assistance Sitting-balance support: Feet supported, No upper extremity supported Sitting balance-Leahy Scale: Fair Sitting balance - Comments: fair to good seated in recliner   Standing balance support: Bilateral upper extremity supported, During functional activity, Reliant on assistive device for balance Standing balance-Leahy Scale: Poor Standing balance comment: poor to fair with RW; posterior lean slightly                           ADL either performed or assessed with clinical judgement   ADL Overall ADL's : Needs assistance/impaired     Grooming: Set up;Sitting           Upper Body Dressing : Set up;Sitting   Lower Body Dressing: Maximal assistance;Sitting/lateral leans   Toilet Transfer: Minimal assistance;Ambulation;Rolling walker (2 wheels) Toilet Transfer Details (indicate cue type and reason): Simulated via sit to stand from chair and short distance ambulation forward and  backward. Toileting- Clothing Manipulation and Hygiene: Moderate assistance;Sitting/lateral lean;Maximal assistance       Functional mobility during ADLs: Minimal assistance;Rolling walker (2 wheels) General ADL Comments: Able to take ~3 steps away from chair and 3 back to chair walking backwards.     Vision Baseline Vision/History: 1 Wears glasses Ability to See in Adequate Light: 1 Impaired Patient Visual Report: No  change from baseline Vision Assessment?: No apparent visual deficits     Perception Perception: Not tested       Praxis Praxis: Not tested       Pertinent Vitals/Pain Pain Assessment Pain Assessment: No/denies pain     Extremity/Trunk Assessment Upper Extremity Assessment Upper Extremity Assessment: Generalized weakness   Lower Extremity Assessment Lower Extremity Assessment: Defer to PT evaluation   Cervical / Trunk Assessment Cervical / Trunk Assessment: Normal   Communication Communication Communication: No apparent difficulties   Cognition Arousal: Alert Behavior During Therapy: WFL for tasks assessed/performed Overall Cognitive Status: History of cognitive impairments - at baseline                                                        Home Living Family/patient expects to be discharged to:: Private residence Living Arrangements: Spouse/significant other Available Help at Discharge: Family Type of Home: House Home Access: Ramped entrance     Home Layout: One level     Bathroom Shower/Tub: Chief Strategy Officer: Handicapped height Bathroom Accessibility: Yes How Accessible: Accessible via walker Home Equipment: Agricultural consultant (2 wheels);Rollator (4 wheels);BSC/3in1;Shower seat;Grab bars - toilet;Lift chair;Grab bars - tub/shower          Prior Functioning/Environment Prior Level of Function : Needs assist       Physical Assist : ADLs (physical)   ADLs (physical): IADLs;Bathing;Dressing Mobility Comments: RW ambulation in the home; w/c for community. ADLs Comments: Assist for bathing and dressing. Assist IADL's.        OT Problem List: Decreased strength;Decreased activity tolerance;Impaired balance (sitting and/or standing)      OT Treatment/Interventions: Self-care/ADL training;Therapeutic exercise;Therapeutic activities;Patient/family education;Balance training;Cognitive remediation/compensation    OT  Goals(Current goals can be found in the care plan section) Acute Rehab OT Goals Patient Stated Goal: return home OT Goal Formulation: With patient Time For Goal Achievement: 08/22/23 Potential to Achieve Goals: Good  OT Frequency: Min 2X/week                                   End of Session Equipment Utilized During Treatment: Rolling walker (2 wheels) Nurse Communication: Mobility status  Activity Tolerance: Patient tolerated treatment well Patient left: in chair;with call bell/phone within reach  OT Visit Diagnosis: Unsteadiness on feet (R26.81);Other abnormalities of gait and mobility (R26.89);Muscle weakness (generalized) (M62.81);Other symptoms and signs involving cognitive function                Time: 2595-6387 OT Time Calculation (min): 9 min Charges:  OT General Charges $OT Visit: 1 Visit OT Evaluation $OT Eval Low Complexity: 1 Low  Kye Hedden OT, MOT   Danie Chandler 08/08/2023, 1:09 PM

## 2023-08-08 NOTE — Plan of Care (Signed)
  Problem: Acute Rehab OT Goals (only OT should resolve) Goal: Pt. Will Perform Grooming Flowsheets (Taken 08/08/2023 1311) Pt Will Perform Grooming: with modified independence Goal: Pt. Will Perform Upper Body Dressing Flowsheets (Taken 08/08/2023 1311) Pt Will Perform Upper Body Dressing: with modified independence Goal: Pt. Will Perform Lower Body Dressing Flowsheets (Taken 08/08/2023 1311) Pt Will Perform Lower Body Dressing:  with min assist  sitting/lateral leans Goal: Pt. Will Transfer To Toilet Flowsheets (Taken 08/08/2023 1311) Pt Will Transfer to Toilet:  with supervision  ambulating Goal: Pt. Will Perform Toileting-Clothing Manipulation Flowsheets (Taken 08/08/2023 1311) Pt Will Perform Toileting - Clothing Manipulation and hygiene: with modified independence Goal: Pt/Caregiver Will Perform Home Exercise Program Flowsheets (Taken 08/08/2023 1311) Pt/caregiver will Perform Home Exercise Program:  Increased strength  Both right and left upper extremity  Independently  Mikhail Hallenbeck OT, MOT

## 2023-08-08 NOTE — Progress Notes (Signed)
Patient alert and oriented to self, time, place and situation. Patient up out of bed with assistance to chair. Gait unsteady. Tried to turn levophed off but had to turn back on about 30 minutes later due to unable to maintain Map >65. Dr Laural Benes aware. Fluids ordered and started. Foley catheter in place, Dr [name] is supposed to come see patient per verbal from Dr Laural Benes. Urine output total and patient had x1 small black stool. Dr Laural Benes aware.

## 2023-08-08 NOTE — Telephone Encounter (Signed)
FYI patient is in the ICU due to infection, daughter wanted you to be aware.  She was also notified biopsy has not resulted and we will reach out once you review.

## 2023-08-08 NOTE — Plan of Care (Signed)
  Problem: Acute Rehab PT Goals(only PT should resolve) Goal: Pt Will Go Supine/Side To Sit Outcome: Progressing Flowsheets (Taken 08/08/2023 1418) Pt will go Supine/Side to Sit: with supervision Goal: Patient Will Transfer Sit To/From Stand Outcome: Progressing Flowsheets (Taken 08/08/2023 1418) Patient will transfer sit to/from stand:  with contact guard assist  with minimal assist Goal: Pt Will Transfer Bed To Chair/Chair To Bed Outcome: Progressing Flowsheets (Taken 08/08/2023 1418) Pt will Transfer Bed to Chair/Chair to Bed:  with contact guard assist  with min assist Goal: Pt Will Ambulate Outcome: Progressing Flowsheets (Taken 08/08/2023 1418) Pt will Ambulate:  25 feet  with minimal assist  with rolling walker   2:18 PM, 08/08/23 Ocie Bob, MPT Physical Therapist with [facility] [phone] office [phone] mobile phone

## 2023-08-09 ENCOUNTER — Ambulatory Visit: Payer: 59 | Admitting: Urology

## 2023-08-09 DIAGNOSIS — A419 Sepsis, unspecified organism: Secondary | ICD-10-CM | POA: Diagnosis not present

## 2023-08-09 DIAGNOSIS — R6521 Severe sepsis with septic shock: Secondary | ICD-10-CM | POA: Diagnosis not present

## 2023-08-09 DIAGNOSIS — N3289 Other specified disorders of bladder: Secondary | ICD-10-CM

## 2023-08-09 DIAGNOSIS — Z09 Encounter for follow-up examination after completed treatment for conditions other than malignant neoplasm: Secondary | ICD-10-CM

## 2023-08-09 DIAGNOSIS — N3080 Other cystitis without hematuria: Secondary | ICD-10-CM

## 2023-08-09 LAB — BASIC METABOLIC PANEL
Anion gap: 8 (ref 5–15)
BUN: 13 mg/dL (ref 8–23)
CO2: 20 mmol/L — ABNORMAL LOW (ref 22–32)
Calcium: 8.1 mg/dL — ABNORMAL LOW (ref 8.9–10.3)
Chloride: 109 mmol/L (ref 98–111)
Creatinine, Ser: 1.08 mg/dL — ABNORMAL HIGH (ref 0.44–1.00)
GFR, Estimated: 52 mL/min — ABNORMAL LOW (ref >60)
Glucose, Bld: 100 mg/dL — ABNORMAL HIGH (ref 70–99)
Potassium: 3.5 mmol/L (ref 3.5–5.1)
Sodium: 137 mmol/L (ref 135–145)

## 2023-08-09 LAB — GLUCOSE, CAPILLARY
Glucose-Capillary: 93 mg/dL (ref 70–99)
Glucose-Capillary: 89 mg/dL (ref 70–99)
Glucose-Capillary: 80 mg/dL (ref 70–99)
Glucose-Capillary: 72 mg/dL (ref 70–99)

## 2023-08-09 LAB — CBC
HCT: 31.6 % — ABNORMAL LOW (ref 36.0–46.0)
Hemoglobin: 10.2 g/dL — ABNORMAL LOW (ref 12.0–15.0)
MCH: 30.5 pg (ref 26.0–34.0)
MCHC: 32.3 g/dL (ref 30.0–36.0)
MCV: 94.6 fL (ref 80.0–100.0)
Platelets: 135 10*3/uL — ABNORMAL LOW (ref 150–400)
RBC: 3.34 MIL/uL — ABNORMAL LOW (ref 3.87–5.11)
RDW: 14.7 % (ref 11.5–15.5)
WBC: 4.8 10*3/uL (ref 4.0–10.5)
nRBC: 0 % (ref 0.0–0.2)

## 2023-08-09 LAB — TYPE AND SCREEN
ABO/RH(D): A NEG
Antibody Screen: NEGATIVE

## 2023-08-09 MED ORDER — ALPRAZOLAM 0.25 MG PO TABS
0.2500 mg | ORAL_TABLET | Freq: Once | ORAL | Status: AC | PRN
  Administered 2023-08-09: 0.25 mg via ORAL
  Filled 2023-08-09: qty 1

## 2023-08-09 MED ORDER — NICOTINE 14 MG/24HR TD PT24
14.0000 mg | MEDICATED_PATCH | Freq: Every day | TRANSDERMAL | Status: DC
  Administered 2023-08-09 – 2023-08-11 (×3): 14 mg via TRANSDERMAL
  Filled 2023-08-09 (×3): qty 1

## 2023-08-09 NOTE — Plan of Care (Signed)

## 2023-08-09 NOTE — Progress Notes (Signed)
PROGRESS NOTE     Amanda Armstrong, is a 78 y.o. female, DOB - 24-Jul-1945, ZOX:[id number]  Admit date - 08/07/2023   Admitting Physician Dolly Rias, MD  Outpatient Primary MD for the patient is [name], MD  LOS - 1  Chief Complaint  Patient presents with   Fever   Fatigue      Brief summary 78 y.o. female with hx of recent TURBT on 1/16 discharged with Foley, recurrent UTI on suppressive antibiotics with Bactrim, CVA, dementia, COPD, CHRF on 2 l O2, hypertension, diabetes, hypothyroidism admitted on 08/08/2023 with severe sepsis with septic shock due to CITROBACTER FREUNDII UTI--    -Assessment and Plan: 1)Sepsis with septic shock--due to CITROBACTER FREUNDII CAUTI--POA -urine culture from 08/07/2023 with Citrobacter  -Continue IV Zosyn pending further culture data =-Unable to wean off Levophed at this time  2)COPD/tobacco abuse--continue bronchodilators -Nicotine patch as ordered  3) hypothyroidism--- continue atorvastatin  4) indwelling Foley catheter--POA -Foley was placed postop (after TURBT) on 08/03/23--  -Foley was exchanged on admission on 08/08/2023 --Patient with patient's urologist Dr. [name] -Official consult note pending  5) acute anemia--suspect hemodilution related -Monitor closely  6) chronic hypoxic respiratory failure--- please see #2 above -Continue supplemental oxygen at 2 L per nasal cannula  Chronic medical problems: History of CVA, dementia: Hold home aspirin in the setting of her anemia, hematuria.  This is secondary prevention for history of strokes.  Continue home donepezil COPD, CHF on 2 L O2: Without acute exacerbation: Continue home equivalent Trelegy Hypertension: Hold home Lasix, not on other antihypertensives Diabetes: Currently diet managed.  SSI while inpatient History recurrent UTI: Holding home Bactrim for now while on antibiotics above.  CRITICAL CARE Performed by: Shon Hale   Total critical care time: 49  minutes  Critical care time was exclusive of separately billable procedures and treating other patients. - Sepsis with septic shock--due to CITROBACTER FREUNDII CAUTI--POA -- Unable to wean off Levophed at this time due to hemodynamic instability  Critical care was necessary to treat or prevent imminent or life-threatening deterioration.  Critical care was time spent personally by me on the following activities: development of treatment plan with patient and/or surrogate as well as nursing, discussions with consultants, evaluation of patient's response to treatment, examination of patient, obtaining history from patient or surrogate, ordering and performing treatments and interventions, ordering and review of laboratory studies, ordering and review of radiographic studies, pulse oximetry and re-evaluation of patient's condition.   Status is: Inpatient   Disposition: The patient is from: Home              Anticipated d/c is to: Home              Anticipated d/c date is: 2 days              Patient currently is not medically stable to d/c. Barriers: Not Clinically Stable-   Code Status :  -  Code Status: Full Code   Family Communication:     (patient is alert, awake and coherent)  -None at bedside  DVT Prophylaxis  :   - SCDs   SCDs Start: 08/08/23 0047   Lab Results  Component Value Date   PLT 135 (L) 08/09/2023   Inpatient Medications  Scheduled Meds:  atorvastatin  20 mg Oral Q1200   donepezil  5 mg Oral QHS   DULoxetine  30 mg Oral Daily   fluticasone furoate-vilanterol  1 puff Inhalation Daily   And   umeclidinium  bromide  1 puff Inhalation Daily   insulin aspart  0-6 Units Subcutaneous TID WC   levothyroxine  75 mcg Oral Q0600   mupirocin ointment   Nasal BID   nicotine  7 mg Transdermal Daily   sodium chloride flush  3 mL Intravenous Q12H   Continuous Infusions:  norepinephrine (LEVOPHED) Adult infusion Stopped (08/09/23 1322)   piperacillin-tazobactam (ZOSYN)   IV Stopped (08/09/23 1800)   PRN Meds:.acetaminophen, melatonin, polyethylene glycol   Anti-infectives (From admission, onward)    Start     Dose/Rate Route Frequency Ordered Stop   08/08/23 0600  piperacillin-tazobactam (ZOSYN) IVPB 3.375 g        3.375 g 12.5 mL/hr over 240 Minutes Intravenous Every 8 hours 08/08/23 0032     08/08/23 0000  ampicillin (OMNIPEN) 1 g in sodium chloride 0.9 % 100 mL IVPB  Status:  Discontinued        1 g 300 mL/hr over 20 Minutes Intravenous  Once 08/07/23 2359 08/08/23 0036   08/07/23 2330  ampicillin (OMNIPEN) injection 1 g  Status:  Discontinued        1 g Intravenous STAT 08/07/23 2326 08/07/23 2359   08/07/23 2245  metroNIDAZOLE (FLAGYL) IVPB 500 mg        500 mg 100 mL/hr over 60 Minutes Intravenous  Once 08/07/23 2236 08/08/23 0035   08/07/23 2145  ceFEPIme (MAXIPIME) 2 g in sodium chloride 0.9 % 100 mL IVPB        2 g 200 mL/hr over 30 Minutes Intravenous  Once 08/07/23 2144 08/07/23 2247        Subjective: Amanda Armstrong today has no fevers, no emesis,  No chest pain,  -Patient wants to smoke -  Objective: Vitals:   08/09/23 1515 08/09/23 1603 08/09/23 1630 08/09/23 1700  BP: (!) 114/47  (!) 102/45 (!) 102/49  Pulse: 68  67 77  Resp:   (!) 27 (!) 28  Temp:  98.2 F (36.8 C)    TempSrc:      SpO2: 100%  98% 100%  Weight:      Height:        Intake/Output Summary (Last 24 hours) at 08/09/2023 1821 Last data filed at 08/09/2023 1805 Gross per 24 hour  Intake 2013.17 ml  Output 890 ml  Net 1123.17 ml   Filed Weights   08/08/23 0216 08/09/23 0500  Weight: 70.7 kg 64.4 kg    Physical Exam  Gen:- Awake Alert,   HEENT:- .AT, No sclera icterus Neck-Supple Neck,No JVD,.  Lungs-  CTAB , fair symmetrical air movement CV- S1, S2 normal, regular  Abd-  +ve B.Sounds, Abd Soft, No tenderness, no CVA area tenderness Extremity/Skin:- No  edema, pedal pulses present  Psych-affect is appropriate, oriented x3 Neuro-no new focal  deficits, no tremors GU--Foley in situ--placed on 08/03/2023, changed on admission on 08/08/2023  Data Reviewed: I have personally reviewed following labs and imaging studies  CBC: Recent Labs  Lab 08/07/23 2051 08/08/23 0419 08/09/23 0457  WBC 8.0 7.4 4.8  NEUTROABS 7.5  --   --   HGB 10.9* 10.6* 10.2*  HCT 33.2* 33.6* 31.6*  MCV 91.5 94.1 94.6  PLT 178 159 135*   Basic Metabolic Panel: Recent Labs  Lab 08/07/23 2051 08/08/23 0419 08/09/23 0457  NA 137 138 137  K 3.5 3.5 3.5  CL 102 110 109  CO2 25 20* 20*  GLUCOSE 158* 169* 100*  BUN 22 20 13   CREATININE 1.45* 1.21* 1.08*  CALCIUM  9.0 7.7* 8.1*  MG  --  2.0  --   PHOS  --  2.3*  --    GFR: Estimated Creatinine Clearance: 33.5 mL/min (A) (by C-G formula based on SCr of 1.08 mg/dL (H)). Liver Function Tests: Recent Labs  Lab 08/07/23 2051  AST 23  ALT 23  ALKPHOS 65  BILITOT 1.0  PROT 6.7  ALBUMIN 3.2*   Recent Results (from the past 240 hours)  Resp panel by RT-PCR (RSV, Flu A&B, Covid) Anterior Nasal Swab     Status: None   Collection Time: 08/07/23  8:49 PM   Specimen: Anterior Nasal Swab  Result Value Ref Range Status   SARS Coronavirus 2 by RT PCR NEGATIVE NEGATIVE Final    Comment: (NOTE) SARS-CoV-2 target nucleic acids are NOT DETECTED.  The SARS-CoV-2 RNA is generally detectable in upper respiratory specimens during the acute phase of infection. The lowest concentration of SARS-CoV-2 viral copies this assay can detect is 138 copies/mL. A negative result does not preclude SARS-Cov-2 infection and should not be used as the sole basis for treatment or other patient management decisions. A negative result may occur with  improper specimen collection/handling, submission of specimen other than nasopharyngeal swab, presence of viral mutation(s) within the areas targeted by this assay, and inadequate number of viral copies(<138 copies/mL). A negative result must be combined with clinical observations,  patient history, and epidemiological information. The expected result is Negative.  Fact Sheet for Patients:  BloggerCourse.com  Fact Sheet for Healthcare Providers:  SeriousBroker.it  This test is no t yet approved or cleared by the [place] FDA and  has been authorized for detection and/or diagnosis of SARS-CoV-2 by FDA under an Emergency Use Authorization (EUA). This EUA will remain  in effect (meaning this test can be used) for the duration of the COVID-19 declaration under Section 564(b)(1) of the Act, 21 U.S.C.section 360bbb-3(b)(1), unless the authorization is terminated  or revoked sooner.       Influenza A by PCR NEGATIVE NEGATIVE Final   Influenza B by PCR NEGATIVE NEGATIVE Final    Comment: (NOTE) The Xpert Xpress SARS-CoV-2/FLU/RSV plus assay is intended as an aid in the diagnosis of influenza from Nasopharyngeal swab specimens and should not be used as a sole basis for treatment. Nasal washings and aspirates are unacceptable for Xpert Xpress SARS-CoV-2/FLU/RSV testing.  Fact Sheet for Patients: BloggerCourse.com  Fact Sheet for Healthcare Providers: SeriousBroker.it  This test is not yet approved or cleared by the [place] FDA and has been authorized for detection and/or diagnosis of SARS-CoV-2 by FDA under an Emergency Use Authorization (EUA). This EUA will remain in effect (meaning this test can be used) for the duration of the COVID-19 declaration under Section 564(b)(1) of the Act, 21 U.S.C. section 360bbb-3(b)(1), unless the authorization is terminated or revoked.     Resp Syncytial Virus by PCR NEGATIVE NEGATIVE Final    Comment: (NOTE) Fact Sheet for Patients: BloggerCourse.com  Fact Sheet for Healthcare Providers: SeriousBroker.it  This test is not yet approved or cleared by the [place] FDA and has been authorized for detection and/or diagnosis of SARS-CoV-2 by FDA under an Emergency Use Authorization (EUA). This EUA will remain in effect (meaning this test can be used) for the duration of the COVID-19 declaration under Section 564(b)(1) of the Act, 21 U.S.C. section 360bbb-3(b)(1), unless the authorization is terminated or revoked.  Performed at [facility], [address] [place], [place] [address]   Blood Culture (routine x  2)     Status: None (Preliminary result)   Collection Time: 08/07/23  8:51 PM   Specimen: BLOOD  Result Value Ref Range Status   Specimen Description BLOOD BLOOD RIGHT HAND  Final   Special Requests   Final    BOTTLES DRAWN AEROBIC AND ANAEROBIC Blood Culture adequate volume   Culture   Final    NO GROWTH 2 DAYS Performed at [facility], [address] [place], [place] [address]    Report Status PENDING  Incomplete  Blood Culture (routine x 2)     Status: None (Preliminary result)   Collection Time: 08/07/23  9:32 PM   Specimen: BLOOD  Result Value Ref Range Status   Specimen Description BLOOD LEFT ANTECUBITAL  Final   Special Requests   Final    BOTTLES DRAWN AEROBIC AND ANAEROBIC Blood Culture adequate volume   Culture   Final    NO GROWTH 2 DAYS Performed at [facility], [address], [place], [place] [address]    Report Status PENDING  Incomplete  Urine Culture     Status: Abnormal (Preliminary result)   Collection Time: 08/07/23 11:10 PM   Specimen: Urine, Catheterized  Result Value Ref Range Status   Specimen Description   Final    URINE, CATHETERIZED Performed at [facility], [address], [place], [place] [address]    Special Requests   Final    NONE Performed at [facility], [address], [place], [place] [address]    Culture (A)  Final    40,000 COLONIES/mL CITROBACTER FREUNDII CULTURE REINCUBATED FOR BETTER GROWTH SUSCEPTIBILITIES TO FOLLOW Performed at [facility], [address]  [place], [place] [address]    Report Status PENDING  Incomplete  MRSA Next Gen by PCR, Nasal     Status: Abnormal   Collection Time: 08/08/23  1:42 AM   Specimen: Nasal Mucosa; Nasal Swab  Result Value Ref Range Status   MRSA by PCR Next Gen DETECTED (A) NOT DETECTED Final    Comment: RESULT CALLED TO, READ BACK BY AND VERIFIED WITH: ASHLEY SHELTON @ 0726 ON 08/08/23 C VARNER (NOTE) The GeneXpert MRSA Assay (FDA approved for NASAL specimens only), is one component of a comprehensive MRSA colonization surveillance program. It is not intended to diagnose MRSA infection nor to guide or monitor treatment for MRSA infections. Test performance is not FDA approved in patients less than 65 years old. Performed at [facility], [address], [place], [place] [address]     Radiology Studies: CT ABDOMEN PELVIS W CONTRAST Result Date: 08/07/2023 CLINICAL DATA:  Abdominal pain, sepsis EXAM: CT ABDOMEN AND PELVIS WITH CONTRAST TECHNIQUE: Multidetector CT imaging of the abdomen and pelvis was performed using the standard protocol following bolus administration of intravenous contrast. RADIATION DOSE REDUCTION: This exam was performed according to the departmental dose-optimization program which includes automated exposure control, adjustment of the mA and/or kV according to patient size and/or use of iterative reconstruction technique. CONTRAST:  75mL OMNIPAQUE IOHEXOL 300 MG/ML  SOLN COMPARISON:  05/06/2023 FINDINGS: Lower chest: Bibasilar atelectasis. Coronary artery and aortic atherosclerosis. Hepatobiliary: Gallstones fill the gallbladder. No CT evidence of acute cholecystitis. No biliary ductal dilatation or focal hepatic abnormality. Pancreas: No focal abnormality or ductal dilatation. Spleen: No focal abnormality.  Normal size. Adrenals/Urinary Tract: Stable left adrenal nodule most compatible with adenoma. Right adrenal gland unremarkable. No stones or hydronephrosis. Small scattered bilateral renal cysts.  No follow-up imaging recommended. Urinary bladder decompressed with Foley catheter in  place. Bladder wall appears mildly thickened but difficult to evaluate due to decompressed state. Stomach/Bowel: Sigmoid diverticulosis. No active diverticulitis. Moderate stool burden throughout the colon. Stomach and small bowel decompressed, unremarkable. Vascular/Lymphatic: Aortoiliac atherosclerosis. No evidence of aneurysm or adenopathy. Reproductive: Calcified structure in the left adnexa is unchanged since prior study. Gas again noted within the vagina, similar to prior study. Other: No free fluid or free air. Musculoskeletal: Progressive compression fracture at L2, now moderate. No acute bony abnormality. IMPRESSION: Cholelithiasis.  No CT evidence of acute cholecystitis. Bibasilar atelectasis. Gas again seen within the vagina as seen on prior study. Cannot exclude rectovaginal fistula. Bladder wall appears thickened, but bladder is decompressed and difficult evaluate. Recommend clinical correlation to exclude cystitis. Sigmoid diverticulosis.  Moderate stool burden throughout the colon. Aortic atherosclerosis. Electronically Signed   By: Charlett Nose M.D.   On: 08/07/2023 23:09   DG Chest Portable 1 View Result Date: 08/07/2023 CLINICAL DATA:  Fever. 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 Aortic atherosclerotic calcification. Chronic elevation of the right hemidiaphragm. Diffuse interstitial prominence and peribronchial cuffing similar to prior. No focal consolidation, pleural effusion, or pneumothorax. IMPRESSION: Diffuse interstitial prominence and peribronchial cuffing similar to prior, likely bronchitis. Electronically Signed   By: Minerva Fester M.D.   On: 08/07/2023 21:36   Scheduled Meds:  atorvastatin  20 mg Oral Q1200   donepezil  5 mg Oral QHS   DULoxetine  30 mg Oral Daily   fluticasone  furoate-vilanterol  1 puff Inhalation Daily   And   umeclidinium bromide  1 puff Inhalation Daily   insulin aspart  0-6 Units Subcutaneous TID WC   levothyroxine  75 mcg Oral Q0600   mupirocin ointment   Nasal BID   nicotine  7 mg Transdermal Daily   sodium chloride flush  3 mL Intravenous Q12H   Continuous Infusions:  norepinephrine (LEVOPHED) Adult infusion Stopped (08/09/23 1322)   piperacillin-tazobactam (ZOSYN)  IV Stopped (08/09/23 1800)    LOS: 1 day   Shon Hale M.D on 08/09/2023 at 6:21 PM  Go to [email or web address] - for contact info  [facility] - Office  [phone]  If 7PM-7AM, please contact night-coverage [email or web address] 08/09/2023, 6:21 PM

## 2023-08-10 ENCOUNTER — Other Ambulatory Visit: Payer: Self-pay

## 2023-08-10 DIAGNOSIS — B9689 Other specified bacterial agents as the cause of diseases classified elsewhere: Secondary | ICD-10-CM | POA: Diagnosis not present

## 2023-08-10 DIAGNOSIS — N390 Urinary tract infection, site not specified: Secondary | ICD-10-CM | POA: Diagnosis not present

## 2023-08-10 DIAGNOSIS — B952 Enterococcus as the cause of diseases classified elsewhere: Secondary | ICD-10-CM

## 2023-08-10 DIAGNOSIS — A419 Sepsis, unspecified organism: Secondary | ICD-10-CM | POA: Diagnosis not present

## 2023-08-10 DIAGNOSIS — R6521 Severe sepsis with septic shock: Secondary | ICD-10-CM | POA: Diagnosis not present

## 2023-08-10 LAB — URINE CULTURE: Culture: 40000 — AB

## 2023-08-10 LAB — GLUCOSE, CAPILLARY
Glucose-Capillary: 109 mg/dL — ABNORMAL HIGH (ref 70–99)
Glucose-Capillary: 72 mg/dL (ref 70–99)
Glucose-Capillary: 86 mg/dL (ref 70–99)
Glucose-Capillary: 88 mg/dL (ref 70–99)
Glucose-Capillary: 91 mg/dL (ref 70–99)

## 2023-08-10 MED ORDER — SODIUM CHLORIDE 0.9% FLUSH
10.0000 mL | Freq: Two times a day (BID) | INTRAVENOUS | Status: DC
  Administered 2023-08-10 – 2023-08-11 (×2): 10 mL

## 2023-08-10 MED ORDER — SODIUM CHLORIDE 0.9% FLUSH: 10.0000 mL | INTRAVENOUS | Status: DC | PRN

## 2023-08-10 MED ORDER — LORAZEPAM 2 MG/ML IJ SOLN
0.5000 mg | Freq: Two times a day (BID) | INTRAMUSCULAR | Status: DC | PRN
  Administered 2023-08-10: 0.5 mg via INTRAVENOUS
  Filled 2023-08-10: qty 1

## 2023-08-10 MED ORDER — VANCOMYCIN HCL 500 MG/100ML IV SOLN: 500.0000 mg | INTRAVENOUS | Status: DC

## 2023-08-10 MED ORDER — VANCOMYCIN HCL 750 MG/150ML IV SOLN: 750.0000 mg | INTRAVENOUS | Status: DC

## 2023-08-10 MED ORDER — DAPTOMYCIN-SODIUM CHLORIDE 500-0.9 MG/50ML-% IV SOLN
500.0000 mg | Freq: Every day | INTRAVENOUS | Status: DC
  Administered 2023-08-10 – 2023-08-11 (×2): 500 mg via INTRAVENOUS
  Filled 2023-08-10 (×5): qty 50

## 2023-08-10 MED ORDER — SODIUM CHLORIDE 0.9 % IV SOLN
2.0000 g | Freq: Two times a day (BID) | INTRAVENOUS | Status: DC
  Administered 2023-08-10: 2 g via INTRAVENOUS
  Filled 2023-08-10: qty 12.5

## 2023-08-10 MED ORDER — CIPROFLOXACIN HCL 250 MG PO TABS
500.0000 mg | ORAL_TABLET | Freq: Two times a day (BID) | ORAL | Status: DC
  Administered 2023-08-10 – 2023-08-11 (×2): 500 mg via ORAL
  Filled 2023-08-10 (×2): qty 2

## 2023-08-10 MED ORDER — DAPTOMYCIN-SODIUM CHLORIDE 500-0.9 MG/50ML-% IV SOLN
500.0000 mg | Freq: Every day | INTRAVENOUS | Status: DC
  Filled 2023-08-10 (×2): qty 50

## 2023-08-10 MED ORDER — VANCOMYCIN HCL IN DEXTROSE 1-5 GM/200ML-% IV SOLN
1000.0000 mg | Freq: Once | INTRAVENOUS | Status: DC
Start: 2023-08-10 — End: 2023-08-10

## 2023-08-10 NOTE — Plan of Care (Signed)
  Problem: Clinical Measurements: Goal: Ability to maintain clinical measurements within normal limits will improve Outcome: Progressing Goal: Diagnostic test results will improve Outcome: Progressing Goal: Cardiovascular complication will be avoided Outcome: Progressing   Problem: Activity: Goal: Risk for activity intolerance will decrease Outcome: Progressing   

## 2023-08-10 NOTE — Consult Note (Signed)
Urology Consult  Referring physician: Dr. Mariea Clonts Reason for referral: Sepsis after bladder tumor resection  Chief Complaint: suprapubic pain  History of Present Illness: Amanda Armstrong is a 78yo with a history of bladder tumor who was admitted with sepsis from a urinary source 5 days after bladder tumor resection. She has a history of frequent UTIs and was first evaluated by me in 05/2023 for gross hematuria and a possible bladder mass. On office cystoscopy she was found to have posterior bladder wall thickening. She underwent bladder tumor resection 08/03/2023 and the pathology was consistent with chronic infection. 3 days after bladder mass resection she developed chills, worsening supapubic pain and lethargy. She currently has a foley in place due to bladder mass resection. She is currently admitted to ICU on vasopressors. Uirne culture is pending  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    pt stated that she has intermittent sx   Clostridium difficile infection    Daytime somnolence    Frequent falls    Headache(784.0)    SINUS HEADACHE OCCASSIONALLY   Hypertension    Hypothyroidism    Multifactorial gait disorder    Osteoporosis    Right foot drop    Snoring    Stroke (HCC)    Tobacco use    Vertigo    Weakness    Past Surgical History:  Procedure Laterality Date   CATARACT EXTRACTION W/PHACO Right 06/28/2016   Procedure: CATARACT EXTRACTION PHACO AND INTRAOCULAR LENS PLACEMENT (IOC);  Surgeon: Jethro Bolus, MD;  Location: [facility];  Service: Ophthalmology;  Laterality: Right;  CDE: 11.10   CATARACT EXTRACTION W/PHACO Left 07/26/2016   Procedure: CATARACT EXTRACTION PHACO AND INTRAOCULAR LENS PLACEMENT (IOC);  Surgeon: Jethro Bolus, MD;  Location: [facility];  Service: Ophthalmology;  Laterality: Left;  CDE: 8.88   CYSTOSCOPY N/A 08/03/2023   Procedure:  CYSTOSCOPY;  Surgeon: Malen Gauze, MD;  Location: [facility];  Service: Urology;  Laterality: N/A;   JOINT REPLACEMENT  11/08/3010   right total knee   TOTAL KNEE ARTHROPLASTY  11/07/2011   Procedure: TOTAL KNEE ARTHROPLASTY;  Surgeon: Nilda Simmer, MD;  Location: [facility];  Service: Orthopedics;  Laterality: Left;  DR Thurston Hole WANTS 90 MINUTES FOR THIS CASE   TOTAL KNEE ARTHROPLASTY Right 2011   TRANSURETHRAL RESECTION OF BLADDER TUMOR N/A 08/03/2023   Procedure: TRANSURETHRAL RESECTION OF BLADDER TUMOR  (TURBT) WITH GEMCITABINE;  Surgeon: Malen Gauze, MD;  Location: [facility];  Service: Urology;  Laterality: N/A;   TUBAL LIGATION  1982    Medications: I have reviewed the patient's current medications. Allergies:  Allergies  Allergen Reactions   Iron Other (See Comments)    "upset stomach"   Oxycodone Other (See Comments)    "CX HER TO FELL LIKE SHE IS OUT OF HER BODY"   Sudafed [Pseudoephedrine Hcl] Other (See Comments)    "funny feeling"   Chlorhexidine Gluconate Rash    Family History  Problem Relation Age of Onset   Heart attack Mother    COPD Father    Arthritis Sister    Heart disease Brother    Social History:  reports that she has been smoking cigarettes. She has a 48 pack-year smoking history. She has never used smokeless tobacco. She reports that she does not drink alcohol and does not use drugs.  Review of Systems  Constitutional:  Positive for fatigue.  Genitourinary:  Positive for difficulty urinating, dysuria and urgency.  All other systems reviewed and are negative.   Physical Exam:  Vital signs in last 24 hours: Temp:  [97.5 F (36.4 C)-98.3 F (36.8 C)] 97.7 F (36.5 C) (01/23 0556) Pulse Rate:  [50-80] 68 (01/23 0630) Resp:  [15-35] 15 (01/23 0630) BP: (81-146)/(28-88) 139/47 (01/23 0630) SpO2:  [94 %-100 %] 98 % (01/23 0630) Weight:  [66.4 kg] 66.4 kg (01/23 0500) Physical Exam Vitals reviewed.  Constitutional:      Appearance: Normal  appearance.  HENT:     Head: Normocephalic and atraumatic.     Mouth/Throat:     Mouth: Mucous membranes are dry.  Eyes:     Extraocular Movements: Extraocular movements intact.     Pupils: Pupils are equal, round, and reactive to light.  Cardiovascular:     Rate and Rhythm: Normal rate and regular rhythm.  Pulmonary:     Effort: Pulmonary effort is normal. No respiratory distress.  Abdominal:     General: Abdomen is flat. There is no distension.  Musculoskeletal:        General: No swelling. Normal range of motion.     Cervical back: Normal range of motion and neck supple.  Skin:    General: Skin is warm and dry.  Neurological:     General: No focal deficit present.     Mental Status: She is alert.  Psychiatric:        Mood and Affect: Mood normal.        Behavior: Behavior normal.        Thought Content: Thought content normal.        Judgment: Judgment normal.     Laboratory Data:  Results for orders placed or performed during the hospital encounter of 08/07/23 (from the past 72 hours)  Resp panel by RT-PCR (RSV, Flu A&B, Covid) Anterior Nasal Swab     Status: None   Collection Time: 08/07/23  8:49 PM   Specimen: Anterior Nasal Swab  Result Value Ref Range   SARS Coronavirus 2 by RT PCR NEGATIVE NEGATIVE    Comment: (NOTE) SARS-CoV-2 target nucleic acids are NOT DETECTED.  The SARS-CoV-2 RNA is generally detectable in upper respiratory specimens during the acute phase of infection. The lowest concentration of SARS-CoV-2 viral copies this assay can detect is 138 copies/mL. A negative result does not preclude SARS-Cov-2 infection and should not be used as the sole basis for treatment or other patient management decisions. A negative result may occur with  improper specimen collection/handling, submission of specimen other than nasopharyngeal swab, presence of viral mutation(s) within the areas targeted by this assay, and inadequate number of viral copies(<138  copies/mL). A negative result must be combined with clinical observations, patient history, and epidemiological information. The expected result is Negative.  Fact Sheet for Patients:  BloggerCourse.com  Fact Sheet for Healthcare Providers:  SeriousBroker.it  This test is no t yet approved or cleared by the [place] FDA and  has been authorized for detection and/or diagnosis of SARS-CoV-2 by FDA under an Emergency Use Authorization (EUA). This EUA will remain  in effect (meaning this test can be used) for the duration of the COVID-19 declaration under Section 564(b)(1) of the Act, 21 U.S.C.section 360bbb-3(b)(1), unless the authorization is terminated  or revoked sooner.       Influenza A by PCR NEGATIVE NEGATIVE   Influenza B by PCR NEGATIVE NEGATIVE  Comment: (NOTE) The Xpert Xpress SARS-CoV-2/FLU/RSV plus assay is intended as an aid in the diagnosis of influenza from Nasopharyngeal swab specimens and should not be used as a sole basis for treatment. Nasal washings and aspirates are unacceptable for Xpert Xpress SARS-CoV-2/FLU/RSV testing.  Fact Sheet for Patients: BloggerCourse.com  Fact Sheet for Healthcare Providers: SeriousBroker.it  This test is not yet approved or cleared by the [place] FDA and has been authorized for detection and/or diagnosis of SARS-CoV-2 by FDA under an Emergency Use Authorization (EUA). This EUA will remain in effect (meaning this test can be used) for the duration of the COVID-19 declaration under Section 564(b)(1) of the Act, 21 U.S.C. section 360bbb-3(b)(1), unless the authorization is terminated or revoked.     Resp Syncytial Virus by PCR NEGATIVE NEGATIVE    Comment: (NOTE) Fact Sheet for Patients: BloggerCourse.com  Fact Sheet for Healthcare  Providers: SeriousBroker.it  This test is not yet approved or cleared by the [place] FDA and has been authorized for detection and/or diagnosis of SARS-CoV-2 by FDA under an Emergency Use Authorization (EUA). This EUA will remain in effect (meaning this test can be used) for the duration of the COVID-19 declaration under Section 564(b)(1) of the Act, 21 U.S.C. section 360bbb-3(b)(1), unless the authorization is terminated or revoked.  Performed at [facility], [address] [place], [place] [address]   Lactic acid, plasma     Status: None   Collection Time: 08/07/23  8:51 PM  Result Value Ref Range   Lactic Acid, Venous 0.8 0.5 - 1.9 mmol/L    Comment: Performed at [facility], [address], [place], [place] [address]  Comprehensive metabolic panel     Status: Abnormal   Collection Time: 08/07/23  8:51 PM  Result Value Ref Range   Sodium 137 135 - 145 mmol/L   Potassium 3.5 3.5 - 5.1 mmol/L   Chloride 102 98 - 111 mmol/L   CO2 25 22 - 32 mmol/L   Glucose, Bld 158 (H) 70 - 99 mg/dL    Comment: Glucose reference range applies only to samples taken after fasting for at least 8 hours.   BUN 22 8 - 23 mg/dL   Creatinine, Ser 8.29 (H) 0.44 - 1.00 mg/dL   Calcium 9.0 8.9 - 56.2 mg/dL   Total Protein 6.7 6.5 - 8.1 g/dL   Albumin 3.2 (L) 3.5 - 5.0 g/dL   AST 23 15 - 41 U/L   ALT 23 0 - 44 U/L   Alkaline Phosphatase 65 38 - 126 U/L   Total Bilirubin 1.0 0.0 - 1.2 mg/dL   GFR, Estimated 37 (L) >60 mL/min    Comment: (NOTE) Calculated using the CKD-EPI Creatinine Equation (2021)    Anion gap 10 5 - 15    Comment: Performed at [facility], [address], [place], [place] [address]  CBC with Differential     Status: Abnormal   Collection Time: 08/07/23  8:51 PM  Result Value Ref Range   WBC 8.0 4.0 - 10.5 K/uL   RBC 3.63 (L) 3.87 - 5.11 MIL/uL   Hemoglobin 10.9 (L) 12.0 - 15.0 g/dL   HCT 57.8 (L) 46.9 - 62.9 %   MCV 91.5 80.0 - 100.0 fL   MCH  30.0 26.0 - 34.0 pg   MCHC 32.8 30.0 - 36.0 g/dL   RDW 52.8 41.3 - 24.4 %   Platelets 178 150 - 400 K/uL   nRBC 0.0 0.0 - 0.2 %   Neutrophils Relative %  93 %   Neutro Abs 7.5 1.7 - 7.7 K/uL   Lymphocytes Relative 5 %   Lymphs Abs 0.4 (L) 0.7 - 4.0 K/uL   Monocytes Relative 1 %   Monocytes Absolute 0.1 0.1 - 1.0 K/uL   Eosinophils Relative 0 %   Eosinophils Absolute 0.0 0.0 - 0.5 K/uL   Basophils Relative 0 %   Basophils Absolute 0.0 0.0 - 0.1 K/uL   Immature Granulocytes 1 %   Abs Immature Granulocytes 0.06 0.00 - 0.07 K/uL    Comment: Performed at [facility], [address], [place], [place] [address]  Blood Culture (routine x 2)     Status: None (Preliminary result)   Collection Time: 08/07/23  8:51 PM   Specimen: BLOOD  Result Value Ref Range   Specimen Description BLOOD BLOOD RIGHT HAND    Special Requests      BOTTLES DRAWN AEROBIC AND ANAEROBIC Blood Culture adequate volume   Culture      NO GROWTH 3 DAYS Performed at [facility], [address], [place], [place] [address]    Report Status PENDING   Blood Culture (routine x 2)     Status: None (Preliminary result)   Collection Time: 08/07/23  9:32 PM   Specimen: BLOOD  Result Value Ref Range   Specimen Description BLOOD LEFT ANTECUBITAL    Special Requests      BOTTLES DRAWN AEROBIC AND ANAEROBIC Blood Culture adequate volume   Culture      NO GROWTH 3 DAYS Performed at [facility], [address] [place], [place] [address]    Report Status PENDING   Blood gas, venous     Status: Abnormal   Collection Time: 08/07/23  9:32 PM  Result Value Ref Range   pH, Ven 7.46 (H) 7.25 - 7.43   pCO2, Ven 40 (L) 44 - 60 mmHg   pO2, Ven 77 (H) 32 - 45 mmHg   Bicarbonate 28.4 (H) 20.0 - 28.0 mmol/L   Acid-Base Excess 4.4 (H) 0.0 - 2.0 mmol/L   O2 Saturation 98.4 %   Patient temperature 37.5    Collection site LEFT ANTECUBITAL    Drawn by [id number]     Comment: Performed at [facility], [address], [place], [place]  [address]  T4, free     Status: Abnormal   Collection Time: 08/07/23  9:32 PM  Result Value Ref Range   Free T4 1.31 (H) 0.61 - 1.12 ng/dL    Comment: (NOTE) Biotin ingestion may interfere with free T4 tests. If the results are inconsistent with the TSH level, previous test results, or the clinical presentation, then consider biotin interference. If needed, order repeat testing after stopping biotin. Performed at [facility], [address] [place], [place] [address]   Lactic acid, plasma     Status: None   Collection Time: 08/07/23 10:31 PM  Result Value Ref Range   Lactic Acid, Venous 1.1 0.5 - 1.9 mmol/L    Comment: Performed at [facility], [address], [place], [place] [address]  TSH     Status: None   Collection Time: 08/07/23 10:31 PM  Result Value Ref Range   TSH 0.872 0.350 - 4.500 uIU/mL    Comment: Performed by a 3rd Generation assay with a functional sensitivity of <=0.01 uIU/mL. Performed at [facility], [address] [place], [place] [address]   Urinalysis, w/ Reflex to Culture (Infection Suspected) -Urine, Catheterized; Indwelling urinary catheter     Status: Abnormal  Collection Time: 08/07/23 11:10 PM  Result Value Ref Range   Specimen Source URINE, CLEAN CATCH     Comment: CORRECTED ON 01/20 AT 2317: PREVIOUSLY REPORTED AS URINE, CATHETERIZED   Color, Urine AMBER (A) YELLOW    Comment: BIOCHEMICALS MAY BE AFFECTED BY COLOR   APPearance CLOUDY (A) CLEAR   Specific Gravity, Urine 1.022 1.005 - 1.030   pH 5.0 5.0 - 8.0   Glucose, UA NEGATIVE NEGATIVE mg/dL   Hgb urine dipstick LARGE (A) NEGATIVE   Bilirubin Urine NEGATIVE NEGATIVE   Ketones, ur NEGATIVE NEGATIVE mg/dL   Protein, ur >=086 (A) NEGATIVE mg/dL   Nitrite NEGATIVE NEGATIVE   Leukocytes,Ua MODERATE (A) NEGATIVE   RBC / HPF >50 0 - 5 RBC/hpf   WBC, UA >50 0 - 5 WBC/hpf    Comment:        Reflex urine culture not performed if WBC <=10, OR if Squamous epithelial cells >5. If Squamous  epithelial cells >5 suggest recollection.    Bacteria, UA FEW (A) NONE SEEN   Squamous Epithelial / HPF 11-20 0 - 5 /HPF   WBC Clumps PRESENT    Mucus PRESENT     Comment: Performed at [facility], [address], [place], [place] [address]  Urine Culture     Status: Abnormal (Preliminary result)   Collection Time: 08/07/23 11:10 PM   Specimen: Urine, Catheterized  Result Value Ref Range   Specimen Description      URINE, CATHETERIZED Performed at [facility], [address], [place], [place] [address]    Special Requests      NONE Performed at [facility], [address] [place], [place] [address]    Culture (A)     40,000 COLONIES/mL CITROBACTER FREUNDII CULTURE REINCUBATED FOR BETTER GROWTH SUSCEPTIBILITIES TO FOLLOW Performed at [facility], [address] [place], [place] [address]    Report Status PENDING   MRSA Next Gen by PCR, Nasal     Status: Abnormal   Collection Time: 08/08/23  1:42 AM   Specimen: Nasal Mucosa; Nasal Swab  Result Value Ref Range   MRSA by PCR Next Gen DETECTED (A) NOT DETECTED    Comment: RESULT CALLED TO, READ BACK BY AND VERIFIED WITH: ASHLEY SHELTON @ 0726 ON 08/08/23 C VARNER (NOTE) The GeneXpert MRSA Assay (FDA approved for NASAL specimens only), is one component of a comprehensive MRSA colonization surveillance program. It is not intended to diagnose MRSA infection nor to guide or monitor treatment for MRSA infections. Test performance is not FDA approved in patients less than 58 years old. Performed at [facility], [address], [place], [place] [address]   Basic metabolic panel     Status: Abnormal   Collection Time: 08/08/23  4:19 AM  Result Value Ref Range   Sodium 138 135 - 145 mmol/L   Potassium 3.5 3.5 - 5.1 mmol/L   Chloride 110 98 - 111 mmol/L   CO2 20 (L) 22 - 32 mmol/L   Glucose, Bld 169 (H) 70 - 99 mg/dL    Comment: Glucose reference range applies only to samples taken after fasting for at least 8 hours.   BUN  20 8 - 23 mg/dL   Creatinine, Ser 3.66 (H) 0.44 - 1.00 mg/dL   Calcium 7.7 (L) 8.9 - 10.3 mg/dL   GFR, Estimated 46 (L) >60 mL/min    Comment: (NOTE) Calculated using the CKD-EPI Creatinine Equation (2021)    Anion gap 8 5 - 15  Comment: Performed at [facility], [address], [place], [place] [address]  CBC     Status: Abnormal   Collection Time: 08/08/23  4:19 AM  Result Value Ref Range   WBC 7.4 4.0 - 10.5 K/uL   RBC 3.57 (L) 3.87 - 5.11 MIL/uL   Hemoglobin 10.6 (L) 12.0 - 15.0 g/dL   HCT 40.1 (L) 02.7 - 25.3 %   MCV 94.1 80.0 - 100.0 fL   MCH 29.7 26.0 - 34.0 pg   MCHC 31.5 30.0 - 36.0 g/dL   RDW 66.4 40.3 - 47.4 %   Platelets 159 150 - 400 K/uL   nRBC 0.0 0.0 - 0.2 %    Comment: Performed at [facility], [address], [place], [place] [address]  Magnesium     Status: None   Collection Time: 08/08/23  4:19 AM  Result Value Ref Range   Magnesium 2.0 1.7 - 2.4 mg/dL    Comment: Performed at [facility], [address] [place], [place] [address]  Phosphorus     Status: Abnormal   Collection Time: 08/08/23  4:19 AM  Result Value Ref Range   Phosphorus 2.3 (L) 2.5 - 4.6 mg/dL    Comment: Performed at [facility], [address], [place], [place] [address]  Protime-INR     Status: Abnormal   Collection Time: 08/08/23  4:19 AM  Result Value Ref Range   Prothrombin Time 16.0 (H) 11.4 - 15.2 seconds   INR 1.3 (H) 0.8 - 1.2    Comment: (NOTE) INR goal varies based on device and disease states. Performed at [facility], [address], [place], [place] [address]   Glucose, capillary     Status: Abnormal   Collection Time: 08/08/23  7:46 AM  Result Value Ref Range   Glucose-Capillary 108 (H) 70 - 99 mg/dL    Comment: Glucose reference range applies only to samples taken after fasting for at least 8 hours.  Glucose, capillary     Status: Abnormal   Collection Time: 08/08/23 11:40 AM  Result Value Ref Range   Glucose-Capillary 103 (H) 70 - 99 mg/dL    Comment: Glucose  reference range applies only to samples taken after fasting for at least 8 hours.   Comment 1 Notify RN    Comment 2 Document in Chart   Glucose, capillary     Status: Abnormal   Collection Time: 08/08/23  3:52 PM  Result Value Ref Range   Glucose-Capillary 176 (H) 70 - 99 mg/dL    Comment: Glucose reference range applies only to samples taken after fasting for at least 8 hours.  Glucose, capillary     Status: None   Collection Time: 08/08/23 11:49 PM  Result Value Ref Range   Glucose-Capillary 99 70 - 99 mg/dL    Comment: Glucose reference range applies only to samples taken after fasting for at least 8 hours.  Type and screen     Status: None   Collection Time: 08/09/23  4:57 AM  Result Value Ref Range   ABO/RH(D) A NEG    Antibody Screen NEG    Sample Expiration      08/12/2023,2359 Performed at [facility], [address] [place], [place] [address]   CBC     Status: Abnormal   Collection Time: 08/09/23  4:57 AM  Result Value Ref Range   WBC 4.8 4.0 - 10.5 K/uL   RBC 3.34 (L) 3.87 - 5.11 MIL/uL   Hemoglobin 10.2 (L) 12.0 -  15.0 g/dL   HCT 16.1 (L) 09.6 - 04.5 %   MCV 94.6 80.0 - 100.0 fL   MCH 30.5 26.0 - 34.0 pg   MCHC 32.3 30.0 - 36.0 g/dL   RDW 40.9 81.1 - 91.4 %   Platelets 135 (L) 150 - 400 K/uL   nRBC 0.0 0.0 - 0.2 %    Comment: Performed at [facility], [address] [place], [place] [address]  Basic metabolic panel     Status: Abnormal   Collection Time: 08/09/23  4:57 AM  Result Value Ref Range   Sodium 137 135 - 145 mmol/L   Potassium 3.5 3.5 - 5.1 mmol/L   Chloride 109 98 - 111 mmol/L   CO2 20 (L) 22 - 32 mmol/L   Glucose, Bld 100 (H) 70 - 99 mg/dL    Comment: Glucose reference range applies only to samples taken after fasting for at least 8 hours.   BUN 13 8 - 23 mg/dL   Creatinine, Ser 6.21 (H) 0.44 - 1.00 mg/dL   Calcium 8.1 (L) 8.9 - 10.3 mg/dL   GFR, Estimated 52 (L) >60 mL/min    Comment: (NOTE) Calculated using the CKD-EPI Creatinine Equation  (2021)    Anion gap 8 5 - 15    Comment: Performed at [facility], [address], [place], [place] [address]  Glucose, capillary     Status: None   Collection Time: 08/09/23  7:35 AM  Result Value Ref Range   Glucose-Capillary 93 70 - 99 mg/dL    Comment: Glucose reference range applies only to samples taken after fasting for at least 8 hours.   Comment 1 Notify RN    Comment 2 Document in Chart   Glucose, capillary     Status: None   Collection Time: 08/09/23 11:53 AM  Result Value Ref Range   Glucose-Capillary 89 70 - 99 mg/dL    Comment: Glucose reference range applies only to samples taken after fasting for at least 8 hours.   Comment 1 Notify RN    Comment 2 Document in Chart   Glucose, capillary     Status: None   Collection Time: 08/09/23  4:05 PM  Result Value Ref Range   Glucose-Capillary 80 70 - 99 mg/dL    Comment: Glucose reference range applies only to samples taken after fasting for at least 8 hours.  Glucose, capillary     Status: None   Collection Time: 08/09/23  8:06 PM  Result Value Ref Range   Glucose-Capillary 72 70 - 99 mg/dL    Comment: Glucose reference range applies only to samples taken after fasting for at least 8 hours.  Glucose, capillary     Status: None   Collection Time: 08/10/23 12:16 AM  Result Value Ref Range   Glucose-Capillary 72 70 - 99 mg/dL    Comment: Glucose reference range applies only to samples taken after fasting for at least 8 hours.   Recent Results (from the past 240 hours)  Resp panel by RT-PCR (RSV, Flu A&B, Covid) Anterior Nasal Swab     Status: None   Collection Time: 08/07/23  8:49 PM   Specimen: Anterior Nasal Swab  Result Value Ref Range Status   SARS Coronavirus 2 by RT PCR NEGATIVE NEGATIVE Final    Comment: (NOTE) SARS-CoV-2 target nucleic acids are NOT DETECTED.  The SARS-CoV-2 RNA is generally detectable in upper respiratory specimens during the acute phase of infection. The lowest concentration of SARS-CoV-2  viral copies  this assay can detect is 138 copies/mL. A negative result does not preclude SARS-Cov-2 infection and should not be used as the sole basis for treatment or other patient management decisions. A negative result may occur with  improper specimen collection/handling, submission of specimen other than nasopharyngeal swab, presence of viral mutation(s) within the areas targeted by this assay, and inadequate number of viral copies(<138 copies/mL). A negative result must be combined with clinical observations, patient history, and epidemiological information. The expected result is Negative.  Fact Sheet for Patients:  BloggerCourse.com  Fact Sheet for Healthcare Providers:  SeriousBroker.it  This test is no t yet approved or cleared by the [place] FDA and  has been authorized for detection and/or diagnosis of SARS-CoV-2 by FDA under an Emergency Use Authorization (EUA). This EUA will remain  in effect (meaning this test can be used) for the duration of the COVID-19 declaration under Section 564(b)(1) of the Act, 21 U.S.C.section 360bbb-3(b)(1), unless the authorization is terminated  or revoked sooner.       Influenza A by PCR NEGATIVE NEGATIVE Final   Influenza B by PCR NEGATIVE NEGATIVE Final    Comment: (NOTE) The Xpert Xpress SARS-CoV-2/FLU/RSV plus assay is intended as an aid in the diagnosis of influenza from Nasopharyngeal swab specimens and should not be used as a sole basis for treatment. Nasal washings and aspirates are unacceptable for Xpert Xpress SARS-CoV-2/FLU/RSV testing.  Fact Sheet for Patients: BloggerCourse.com  Fact Sheet for Healthcare Providers: SeriousBroker.it  This test is not yet approved or cleared by the [place] FDA and has been authorized for detection and/or diagnosis of SARS-CoV-2 by FDA under an Emergency Use Authorization  (EUA). This EUA will remain in effect (meaning this test can be used) for the duration of the COVID-19 declaration under Section 564(b)(1) of the Act, 21 U.S.C. section 360bbb-3(b)(1), unless the authorization is terminated or revoked.     Resp Syncytial Virus by PCR NEGATIVE NEGATIVE Final    Comment: (NOTE) Fact Sheet for Patients: BloggerCourse.com  Fact Sheet for Healthcare Providers: SeriousBroker.it  This test is not yet approved or cleared by the [place] FDA and has been authorized for detection and/or diagnosis of SARS-CoV-2 by FDA under an Emergency Use Authorization (EUA). This EUA will remain in effect (meaning this test can be used) for the duration of the COVID-19 declaration under Section 564(b)(1) of the Act, 21 U.S.C. section 360bbb-3(b)(1), unless the authorization is terminated or revoked.  Performed at [facility], [address], [place] [place], [place] [address]   Blood Culture (routine x 2)     Status: None (Preliminary result)   Collection Time: 08/07/23  8:51 PM   Specimen: BLOOD  Result Value Ref Range Status   Specimen Description BLOOD BLOOD RIGHT HAND  Final   Special Requests   Final    BOTTLES DRAWN AEROBIC AND ANAEROBIC Blood Culture adequate volume   Culture   Final    NO GROWTH 3 DAYS Performed at [facility], [address], [place], [place] [address]    Report Status PENDING  Incomplete  Blood Culture (routine x 2)     Status: None (Preliminary result)   Collection Time: 08/07/23  9:32 PM   Specimen: BLOOD  Result Value Ref Range Status   Specimen Description BLOOD LEFT ANTECUBITAL  Final   Special Requests   Final    BOTTLES DRAWN AEROBIC AND ANAEROBIC Blood Culture adequate volume   Culture   Final    NO GROWTH 3 DAYS Performed  at [facility], [address], [place], [place] [address]    Report Status PENDING  Incomplete  Urine Culture     Status: Abnormal (Preliminary result)    Collection Time: 08/07/23 11:10 PM   Specimen: Urine, Catheterized  Result Value Ref Range Status   Specimen Description   Final    URINE, CATHETERIZED Performed at [facility], [address] [place], [place] [address]    Special Requests   Final    NONE Performed at [facility], [address] [place], [place] [address]    Culture (A)  Final    40,000 COLONIES/mL CITROBACTER FREUNDII CULTURE REINCUBATED FOR BETTER GROWTH SUSCEPTIBILITIES TO FOLLOW Performed at [facility], [address], [place], [place] [address]    Report Status PENDING  Incomplete  MRSA Next Gen by PCR, Nasal     Status: Abnormal   Collection Time: 08/08/23  1:42 AM   Specimen: Nasal Mucosa; Nasal Swab  Result Value Ref Range Status   MRSA by PCR Next Gen DETECTED (A) NOT DETECTED Final    Comment: RESULT CALLED TO, READ BACK BY AND VERIFIED WITH: ASHLEY SHELTON @ 0726 ON 08/08/23 C VARNER (NOTE) The GeneXpert MRSA Assay (FDA approved for NASAL specimens only), is one component of a comprehensive MRSA colonization surveillance program. It is not intended to diagnose MRSA infection nor to guide or monitor treatment for MRSA infections. Test performance is not FDA approved in patients less than 88 years old. Performed at [facility], [address] [place], [place] [address]    Creatinine: Recent Labs    08/07/23 2051 08/08/23 0419 08/09/23 0457  CREATININE 1.45* 1.21* 1.08*   Baseline Creatinine: 1  Impression/Assessment:  77yo with chronic cystitis, sepsis froma urinary source  Plan:  -We discussed the natural hx of recurrent UTIs and the various causes. We discussed the treatment options including daily prophylaxis, topical estrogen therapy, etc. In this situation the patient would benefit from an extended course of antibiotics folowed by daily prophylaxis. Please continue broad spectrum antibiotics pending her urine culture. She will require 28 days of culture specific antibiotics.  Foley catheter should remain in place until her followup with me 1 week after discharge.   Wilkie Aye 08/10/2023, 7:36 AM

## 2023-08-10 NOTE — Progress Notes (Signed)
Physical Therapy Treatment Patient Details Name: Amanda Armstrong MRN: [id number] DOB: 10/20/1945 Today's Date: 08/10/2023   History of Present Illness Amanda Armstrong is a 78 y.o. 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  History provided by patient's daughter at the bedside due to patient's somnolence.  Daughter reports that over the last few days has been more confused, today disoriented to situation, time.  She had a fever to 101F at home.  Otherwise complaining of right flank pain.  No other recent symptoms per daughter's report    PT Comments  Pt sitting in chair and willing to participate.  Pt required multimodal cueing to assist scooting forward in chair.  Pt limited by weakness and required moderate A with transfer training and gait.  Presents with posterior lean, required cueing to lean forward for neutral positioning.  Upon standing therapist noted bowel movements in chair, RN aware and came to clean.  Difficult to clean in standing position, required mod-max A to sidestep from chair to bed where another bowel movement happened.  EOS pt left in bed with RN in room.     If plan is discharge home, recommend the following:     Can travel by private vehicle        Equipment Recommendations       Recommendations for Other Services       Precautions / Restrictions Precautions Precautions: Fall Restrictions Weight Bearing Restrictions Per Provider Order: No     Mobility  Bed Mobility                    Transfers Overall transfer level: Needs assistance Equipment used: Rolling walker (2 wheels)   Sit to Stand: Mod assist           General transfer comment: very unsteady on feet with posterior leaning    Ambulation/Gait Ambulation/Gait assistance: Mod  assist Gait Distance (Feet): 4 Feet Assistive device: Rolling walker (2 wheels) Gait Pattern/deviations: Decreased step length - right, Decreased step length - left, Decreased stride length Gait velocity: slow     General Gait Details: limited to few sidestep from chair to bed due to weakness and moderate posterior leaning   Stairs             Wheelchair Mobility     Tilt Bed    Modified Rankin (Stroke Patients Only)       Balance                                            Cognition Arousal: Alert Behavior During Therapy: WFL for tasks assessed/performed Overall Cognitive Status: History of cognitive impairments - at baseline                                          Exercises      General Comments        Pertinent Vitals/Pain Pain Assessment Pain Assessment: 0-10 Pain Score: 8  Pain Location: Bil knee pain Pain Descriptors / Indicators: Discomfort, Aching Pain Intervention(s): Limited activity within patient's tolerance, Monitored during session, Repositioned  Home Living                          Prior Function            PT Goals (current goals can now be found in the care plan section)      Frequency           PT Plan      Co-evaluation              AM-PAC PT "6 Clicks" Mobility   Outcome Measure  Help needed turning from your back to your side while in a flat bed without using bedrails?: A Little Help needed moving from lying on your back to sitting on the side of a flat bed without using bedrails?: A Little Help needed moving to and from a bed to a chair (including a wheelchair)?: A Lot Help needed standing up from a chair using your arms (e.g., wheelchair or bedside chair)?: A Lot Help needed to walk in hospital room?: A Lot Help needed climbing 3-5 steps with a railing? : A Lot 6 Click Score: 14    End of Session Equipment Utilized During Treatment: Gait belt Activity  Tolerance: Patient tolerated treatment well;Patient limited by fatigue Patient left: in chair;with call bell/phone within reach Nurse Communication: Mobility status       Time: 1610-9604 PT Time Calculation (min) (ACUTE ONLY): 22 min  Charges:    $Therapeutic Activity: 8-22 mins PT General Charges $$ ACUTE PT VISIT: 1 Visit                     Becky Sax, LPTA/CLT; CBIS [phone]  Juel Burrow 08/10/2023, 2:49 PM

## 2023-08-10 NOTE — Progress Notes (Signed)
Unsuccessful right side PICC attempt. Pt in nad. Area cleaned and dressed with Vaseline gauze and pressure dressing. IV team at [facility]  and Dr Marisa Severin aware.

## 2023-08-10 NOTE — Telephone Encounter (Signed)
Open in error

## 2023-08-10 NOTE — TOC Progression Note (Signed)
Transition of Care [facility]) - Progression Note    Patient Details  Name: Amanda Armstrong MRN: [id number] Date of Birth: [birth year]  [facility] CM/SW Contact  Elliot Gault, LCSW Phone Number: 08/10/2023, 3:55 PM  Clinical Narrative:     TOC following. Per MD, pt will need IV anbx at dc. Spoke with pt's dtr who states that they still prefer to take pt home if possible. Explained [employer] process and the training they will provide along with weekly HHRN. Dtr agreeable to have the training. She understands that if she doesn't think they can manage after the training, that TOC can work on SNF referral instead.  Expected Discharge Plan: Home w Home Health Services Barriers to Discharge: Continued Medical Work up  Expected Discharge Plan and Services       Living arrangements for the past 2 months: Single Family Home                           HH Arranged: PT   Date HH Agency Contacted: 08/08/23 Time HH Agency Contacted: 1451 Representative spoke with at [facility] Agency: Katrina ( [facility] program social work)   Social Determinants of Health (SDOH) Interventions SDOH Screenings   Food Insecurity: No Food Insecurity (08/08/2023)  Housing: Low Risk  (08/08/2023)  Transportation Needs: No Transportation Needs (08/08/2023)  Utilities: Not At Risk (08/08/2023)  Social Connections: Moderately Isolated (08/08/2023)  Tobacco Use: High Risk (08/07/2023)    Readmission Risk Interventions    08/08/2023    2:54 PM 03/24/2021    1:36 PM  Readmission Risk Prevention Plan  Post Dischage Appt  Complete  Medication Screening  Complete  Transportation Screening Complete Complete  PCP or Specialist Appt within 5-7 Days Not Complete   Home Care Screening Complete   Medication Review (RN CM) Complete

## 2023-08-10 NOTE — Telephone Encounter (Signed)
Daughter called and is not happy and demands a call from Dr [name], she does not want her mom going home with catheter and the note from Dr [name] says to keep catheter another week.

## 2023-08-10 NOTE — Progress Notes (Signed)
Peripherally Inserted Central Catheter Placement  The IV Nurse has discussed with the patient and/or persons authorized to consent for the patient, the purpose of this procedure and the potential benefits and risks involved with this procedure.  The benefits include less needle sticks, lab draws from the catheter, and the patient may be discharged home with the catheter. Risks include, but not limited to, infection, bleeding, blood clot (thrombus formation), and puncture of an artery; nerve damage and irregular heartbeat and possibility to perform a PICC exchange if needed/ordered by physician.  Alternatives to this procedure were also discussed.  Bard Power PICC patient education guide, fact sheet on infection prevention and patient information card has been provided to patient /or left at bedside.    PICC Placement Documentation  PICC Single Lumen 08/10/23 Left Basilic 39 cm 0 cm (Active)  Indication for Insertion or Continuance of Line Home intravenous therapies (PICC only);Prolonged intravenous therapies 08/10/23 2102  Exposed Catheter (cm) 0 cm 08/10/23 2102  Site Assessment Clean, Dry, Intact 08/10/23 2102  Line Status Flushed;Saline locked;Blood return noted 08/10/23 2102  Dressing Type Securing device;Transparent 08/10/23 2102  Dressing Status Antimicrobial disc/dressing in place;Clean, Dry, Intact 08/10/23 2102  Line Care Connections checked and tightened 08/10/23 2102  Dressing Intervention New dressing;Adhesive placed at insertion site (IV team only) 08/10/23 2102  Dressing Change Due 08/17/23 08/10/23 2102       Yazmeen Woolf, Lajean Manes 08/10/2023, 9:04 PM

## 2023-08-10 NOTE — Plan of Care (Signed)
  Problem: Education: Goal: Knowledge of General Education information will improve Description: Including pain rating scale, medication(s)/side effects and non-pharmacologic comfort measures Outcome: Progressing   Problem: Health Behavior/Discharge Planning: Goal: Ability to manage health-related needs will improve Outcome: Progressing   Problem: Clinical Measurements: Goal: Ability to maintain clinical measurements within normal limits will improve Outcome: Progressing Goal: Will remain free from infection Outcome: Progressing Goal: Diagnostic test results will improve Outcome: Progressing Goal: Respiratory complications will improve Outcome: Progressing Goal: Cardiovascular complication will be avoided Outcome: Progressing   Problem: Activity: Goal: Risk for activity intolerance will decrease Outcome: Progressing   Problem: Nutrition: Goal: Adequate nutrition will be maintained Outcome: Progressing   Problem: Elimination: Goal: Will not experience complications related to bowel motility Outcome: Progressing Goal: Will not experience complications related to urinary retention Outcome: Progressing   Problem: Pain Managment: Goal: General experience of comfort will improve and/or be controlled Outcome: Progressing   Problem: Safety: Goal: Ability to remain free from injury will improve Outcome: Progressing   Problem: Skin Integrity: Goal: Risk for impaired skin integrity will decrease Outcome: Progressing   Problem: Education: Goal: Ability to describe self-care measures that may prevent or decrease complications (Diabetes Survival Skills Education) will improve Outcome: Progressing Goal: Individualized Educational Video(s) Outcome: Progressing   Problem: Coping: Goal: Ability to adjust to condition or change in health will improve Outcome: Progressing   Problem: Fluid Volume: Goal: Ability to maintain a balanced intake and output will improve Outcome:  Progressing   Problem: Health Behavior/Discharge Planning: Goal: Ability to identify and utilize available resources and services will improve Outcome: Progressing Goal: Ability to manage health-related needs will improve Outcome: Progressing   Problem: Metabolic: Goal: Ability to maintain appropriate glucose levels will improve Outcome: Progressing   Problem: Nutritional: Goal: Maintenance of adequate nutrition will improve Outcome: Progressing Goal: Progress toward achieving an optimal weight will improve Outcome: Progressing   Problem: Skin Integrity: Goal: Risk for impaired skin integrity will decrease Outcome: Progressing   Problem: Tissue Perfusion: Goal: Adequacy of tissue perfusion will improve Outcome: Progressing

## 2023-08-10 NOTE — Progress Notes (Signed)
PROGRESS NOTE  Amanda Armstrong, is a 78 y.o. female, DOB - 03-23-1946, WUJ:[id number]  Admit date - 08/07/2023   Admitting Physician Dolly Rias, MD  Outpatient Primary MD for the patient is [name], MD  LOS - 2  Chief Complaint  Patient presents with   Fever   Fatigue      Brief summary 78 y.o. female with hx of recent TURBT on 1/16 discharged with Foley, recurrent UTI on suppressive antibiotics with Bactrim, CVA, dementia, COPD, CHRF on 2 l O2, hypertension, diabetes, hypothyroidism admitted on 08/08/2023 with severe sepsis with septic shock due to CITROBACTER FREUNDII UTI--    -Assessment and Plan: 1)Sepsis with septic shock--due to CITROBACTER FREUNDII and ENTEROCOCCUS RAFFINOSUS CAUTI--POA -urine culture from 08/07/2023 with Citrobacter  and Enterococcus 08/10/23 -Finally able to wean off IV Levophed on 08/10/2023 -Currently on IV Zosyn -Anticipate switching to IV vancomycin plus iv cefepime -Urologist is anticipating 2 weeks of antibiotic therapy show patient will probably need a PICC line , may not need PICC line if Zyvox and oral Cipro are options -Awaiting further input from ID team  2)Metaplasia and acute and chronic inflammation of the Bladder -- -Recent TURBT on 08/03/23 with pathology showing---.-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 [name] -Official consult note pending  5) acute anemia and thrombocytopenia--suspect drop in hemoglobin is due to hemodilution due to sepsis protocol IV fluids -Thrombocytopenia may be due to bone marrow suppression from acute/severe infection -No bleeding concerns at this  time -Monitor closely  6) chronic hypoxic respiratory failure--- please see #2 above -Continue supplemental oxygen at 2 L per nasal cannula  7)DM2-prior A1c 5.8 reflecting excellent diabetic control PTA -her Diabetes is mostly diet controlled Use Novolog/Humalog Sliding scale insulin with Accu-Cheks/Fingersticks as ordered   8)Tobacco Abuse--- Smoking cessation counseling for 4 minutes today,  Give nicotine patch I have discussed tobacco cessation with the patient.  I have counseled the patient regarding the negative impacts of continued tobacco use including but not limited to lung cancer, COPD, and cardiovascular disease.  I have discussed alternatives to tobacco and modalities that may help facilitate tobacco cessation including but not limited to biofeedback, hypnosis, and medications.  Total time spent with tobacco counseling was 4 minutes.    Chronic medical problems: History of CVA, dementia: Hold home aspirin in the setting of her anemia, hematuria.  This is secondary prevention for history of strokes.  Continue home donepezil Hypothyroidism--continue levothyroxine Hypertension: Hold home Lasix, not on other antihypertensives History recurrent UTI: Holding home Bactrim for now while on antibiotics above.  CRITICAL CARE Performed by: Shon Hale   Total critical care time: 41 minutes  Critical care time was exclusive of separately billable procedures and treating other patients. - Sepsis with septic shock---urine culture from 08/07/2023 with Citrobacter  and Enterococcus -- As of 08/10/2023 finally able to wean off Levophed  -Hemodynamics have improved Critical care was necessary to treat or prevent imminent or life-threatening deterioration.  Critical care was time spent personally by me on the following activities: development of treatment plan with patient and/or surrogate as well as nursing, discussions with consultants, evaluation of patient's response to treatment,  examination of patient, obtaining history from patient or surrogate, ordering and performing treatments and interventions,  ordering and review of laboratory studies, ordering and review of radiographic studies, pulse oximetry and re-evaluation of patient's condition.   Status is: Inpatient   Disposition: The patient is from: Home              Anticipated d/c is to: Home              Anticipated d/c date is: 2 days              Patient currently is not medically stable to d/c. Barriers: Not Clinically Stable-   Code Status :  -  Code Status: Full Code   Family Communication:     (patient is alert, awake and coherent)  -None at bedside  DVT Prophylaxis  :   - SCDs   SCDs Start: 08/08/23 0047   Lab Results  Component Value Date   PLT 135 (L) 08/09/2023   Inpatient Medications  Scheduled Meds:  atorvastatin  20 mg Oral Q1200   donepezil  5 mg Oral QHS   DULoxetine  30 mg Oral Daily   fluticasone furoate-vilanterol  1 puff Inhalation Daily   And   umeclidinium bromide  1 puff Inhalation Daily   insulin aspart  0-6 Units Subcutaneous TID WC   levothyroxine  75 mcg Oral Q0600   mupirocin ointment   Nasal BID   nicotine  14 mg Transdermal Daily   sodium chloride flush  3 mL Intravenous Q12H   Continuous Infusions:  norepinephrine (LEVOPHED) Adult infusion Stopped (08/10/23 0626)   piperacillin-tazobactam (ZOSYN)  IV 3.375 g (08/10/23 0629)   PRN Meds:.acetaminophen, melatonin, polyethylene glycol   Anti-infectives (From admission, onward)    Start     Dose/Rate Route Frequency Ordered Stop   08/08/23 0600  piperacillin-tazobactam (ZOSYN) IVPB 3.375 g        3.375 g 12.5 mL/hr over 240 Minutes Intravenous Every 8 hours 08/08/23 0032     08/08/23 0000  ampicillin (OMNIPEN) 1 g in sodium chloride 0.9 % 100 mL IVPB  Status:  Discontinued        1 g 300 mL/hr over 20 Minutes Intravenous  Once 08/07/23 2359 08/08/23 0036   08/07/23 2330  ampicillin (OMNIPEN) injection 1 g   Status:  Discontinued        1 g Intravenous STAT 08/07/23 2326 08/07/23 2359   08/07/23 2245  metroNIDAZOLE (FLAGYL) IVPB 500 mg        500 mg 100 mL/hr over 60 Minutes Intravenous  Once 08/07/23 2236 08/08/23 0035   08/07/23 2145  ceFEPIme (MAXIPIME) 2 g in sodium chloride 0.9 % 100 mL IVPB        2 g 200 mL/hr over 30 Minutes Intravenous  Once 08/07/23 2144 08/07/23 2247        Subjective: Faythe Casa today has no fevers, no emesis,  No chest pain,  - -No productive cough -Complains of generalized weakness -Appetite is okay -  Objective: Vitals:   08/10/23 0730 08/10/23 0800 08/10/23 1000 08/10/23 1030  BP: (!) 104/40  (!) 110/39 (!) 106/44  Pulse: 68 76 72 (!) 59  Resp: (!) 27 (!) 25 17 (!) 27  Temp:  (!) 97.3 F (36.3 C)    TempSrc:      SpO2: 95% 98% 100% 95%  Weight:      Height:        Intake/Output Summary (Last 24 hours) at 08/10/2023 1048 Last data filed at 08/10/2023 0947 Gross per 24 hour  Intake 1225.32 ml  Output 1390 ml  Net -164.68 ml   Filed Weights   08/08/23 0216 08/09/23 0500 08/10/23 0500  Weight: 70.7 kg 64.4 kg 66.4 kg    Physical Exam  Gen:- Awake Alert,  in no apparent distress  HEENT:- [place].AT, No sclera icterus Neck-Supple Neck,No JVD,.  Lungs-  CTAB , fair symmetrical air movement CV- S1, S2 normal, regular  Abd-  +ve B.Sounds, Abd Soft, No tenderness, no CVA area tenderness Extremity/Skin:- No  edema, pedal pulses present  Psych-affect is appropriate, oriented x3 Neuro-generalized weakness, no new focal deficits, no tremors GU--Foley in situ--placed on 08/03/2023, changed on admission on 08/08/2023  Data Reviewed: I have personally reviewed following labs and imaging studies  CBC: Recent Labs  Lab 08/07/23 2051 08/08/23 0419 08/09/23 0457  WBC 8.0 7.4 4.8  NEUTROABS 7.5  --   --   HGB 10.9* 10.6* 10.2*  HCT 33.2* 33.6* 31.6*  MCV 91.5 94.1 94.6  PLT 178 159 135*   Basic Metabolic Panel: Recent Labs  Lab  08/07/23 2051 08/08/23 0419 08/09/23 0457  NA 137 138 137  K 3.5 3.5 3.5  CL 102 110 109  CO2 25 20* 20*  GLUCOSE 158* 169* 100*  BUN 22 20 13   CREATININE 1.45* 1.21* 1.08*  CALCIUM 9.0 7.7* 8.1*  MG  --  2.0  --   PHOS  --  2.3*  --    GFR: Estimated Creatinine Clearance: 34.1 mL/min (A) (by C-G formula based on SCr of 1.08 mg/dL (H)). Liver Function Tests: Recent Labs  Lab 08/07/23 2051  AST 23  ALT 23  ALKPHOS 65  BILITOT 1.0  PROT 6.7  ALBUMIN 3.2*   Recent Results (from the past 240 hours)  Resp panel by RT-PCR (RSV, Flu A&B, Covid) Anterior Nasal Swab     Status: None   Collection Time: 08/07/23  8:49 PM   Specimen: Anterior Nasal Swab  Result Value Ref Range Status   SARS Coronavirus 2 by RT PCR NEGATIVE NEGATIVE Final    Comment: (NOTE) SARS-CoV-2 target nucleic acids are NOT DETECTED.  The SARS-CoV-2 RNA is generally detectable in upper respiratory specimens during the acute phase of infection. The lowest concentration of SARS-CoV-2 viral copies this assay can detect is 138 copies/mL. A negative result does not preclude SARS-Cov-2 infection and should not be used as the sole basis for treatment or other patient management decisions. A negative result may occur with  improper specimen collection/handling, submission of specimen other than nasopharyngeal swab, presence of viral mutation(s) within the areas targeted by this assay, and inadequate number of viral copies(<138 copies/mL). A negative result must be combined with clinical observations, patient history, and epidemiological information. The expected result is Negative.  Fact Sheet for Patients:  BloggerCourse.com  Fact Sheet for Healthcare Providers:  SeriousBroker.it  This test is no t yet approved or cleared by the [place] FDA and  has been authorized for detection and/or diagnosis of SARS-CoV-2 by FDA under an Emergency Use Authorization  (EUA). This EUA will remain  in effect (meaning this test can be used) for the duration of the COVID-19 declaration under Section 564(b)(1) of the Act, 21 U.S.C.section 360bbb-3(b)(1), unless the authorization is terminated  or revoked sooner.       Influenza A by PCR NEGATIVE NEGATIVE Final   Influenza B by PCR NEGATIVE NEGATIVE Final    Comment: (NOTE) The Xpert Xpress SARS-CoV-2/FLU/RSV plus assay is intended as an aid in the diagnosis of influenza from Nasopharyngeal swab specimens  and should not be used as a sole basis for treatment. Nasal washings and aspirates are unacceptable for Xpert Xpress SARS-CoV-2/FLU/RSV testing.  Fact Sheet for Patients: BloggerCourse.com  Fact Sheet for Healthcare Providers: SeriousBroker.it  This test is not yet approved or cleared by the [place] FDA and has been authorized for detection and/or diagnosis of SARS-CoV-2 by FDA under an Emergency Use Authorization (EUA). This EUA will remain in effect (meaning this test can be used) for the duration of the COVID-19 declaration under Section 564(b)(1) of the Act, 21 U.S.C. section 360bbb-3(b)(1), unless the authorization is terminated or revoked.     Resp Syncytial Virus by PCR NEGATIVE NEGATIVE Final    Comment: (NOTE) Fact Sheet for Patients: BloggerCourse.com  Fact Sheet for Healthcare Providers: SeriousBroker.it  This test is not yet approved or cleared by the [place] FDA and has been authorized for detection and/or diagnosis of SARS-CoV-2 by FDA under an Emergency Use Authorization (EUA). This EUA will remain in effect (meaning this test can be used) for the duration of the COVID-19 declaration under Section 564(b)(1) of the Act, 21 U.S.C. section 360bbb-3(b)(1), unless the authorization is terminated or revoked.  Performed at [facility], [address], [place], [place]  [address]   Blood Culture (routine x 2)     Status: None (Preliminary result)   Collection Time: 08/07/23  8:51 PM   Specimen: BLOOD  Result Value Ref Range Status   Specimen Description BLOOD BLOOD RIGHT HAND  Final   Special Requests   Final    BOTTLES DRAWN AEROBIC AND ANAEROBIC Blood Culture adequate volume   Culture   Final    NO GROWTH 3 DAYS Performed at [facility], [address] [place], [place] [address]    Report Status PENDING  Incomplete  Blood Culture (routine x 2)     Status: None (Preliminary result)   Collection Time: 08/07/23  9:32 PM   Specimen: BLOOD  Result Value Ref Range Status   Specimen Description BLOOD LEFT ANTECUBITAL  Final   Special Requests   Final    BOTTLES DRAWN AEROBIC AND ANAEROBIC Blood Culture adequate volume   Culture   Final    NO GROWTH 3 DAYS Performed at [facility], [address], [place], [place] [address]    Report Status PENDING  Incomplete  Urine Culture     Status: Abnormal   Collection Time: 08/07/23 11:10 PM   Specimen: Urine, Catheterized  Result Value Ref Range Status   Specimen Description   Final    URINE, CATHETERIZED Performed at [facility], [address], [place], [place] [address]    Special Requests   Final    NONE Performed at [facility], [address], [place], [place] [address]    Culture (A)  Final    40,000 COLONIES/mL CITROBACTER FREUNDII 30,000 COLONIES/mL ENTEROCOCCUS RAFFINOSUS    Report Status 08/10/2023 FINAL  Final   Organism ID, Bacteria CITROBACTER FREUNDII (A)  Final   Organism ID, Bacteria ENTEROCOCCUS RAFFINOSUS (A)  Final      Susceptibility   Citrobacter freundii - MIC*    CEFEPIME 2 SENSITIVE Sensitive     CEFTRIAXONE >=64 RESISTANT Resistant     CIPROFLOXACIN <=0.25 SENSITIVE Sensitive     GENTAMICIN <=1 SENSITIVE Sensitive     IMIPENEM 0.5 SENSITIVE Sensitive     NITROFURANTOIN 256 RESISTANT Resistant     TRIMETH/SULFA >=320 RESISTANT Resistant     PIP/TAZO >=128 RESISTANT  Resistant ug/mL    *  40,000 COLONIES/mL CITROBACTER FREUNDII   Enterococcus raffinosus - MIC*    AMPICILLIN 16 RESISTANT Resistant     NITROFURANTOIN 32 SENSITIVE Sensitive     VANCOMYCIN <=0.5 SENSITIVE Sensitive     * 30,000 COLONIES/mL ENTEROCOCCUS RAFFINOSUS  MRSA Next Gen by PCR, Nasal     Status: Abnormal   Collection Time: 08/08/23  1:42 AM   Specimen: Nasal Mucosa; Nasal Swab  Result Value Ref Range Status   MRSA by PCR Next Gen DETECTED (A) NOT DETECTED Final    Comment: RESULT CALLED TO, READ BACK BY AND VERIFIED WITH: ASHLEY SHELTON @ 0726 ON 08/08/23 C VARNER (NOTE) The GeneXpert MRSA Assay (FDA approved for NASAL specimens only), is one component of a comprehensive MRSA colonization surveillance program. It is not intended to diagnose MRSA infection nor to guide or monitor treatment for MRSA infections. Test performance is not FDA approved in patients less than 18 years old. Performed at [facility], [address], [place], [place] [address]     Scheduled Meds:  atorvastatin  20 mg Oral Q1200   donepezil  5 mg Oral QHS   DULoxetine  30 mg Oral Daily   fluticasone furoate-vilanterol  1 puff Inhalation Daily   And   umeclidinium bromide  1 puff Inhalation Daily   insulin aspart  0-6 Units Subcutaneous TID WC   levothyroxine  75 mcg Oral Q0600   mupirocin ointment   Nasal BID   nicotine  14 mg Transdermal Daily   sodium chloride flush  3 mL Intravenous Q12H   Continuous Infusions:  norepinephrine (LEVOPHED) Adult infusion Stopped (08/10/23 0626)   piperacillin-tazobactam (ZOSYN)  IV 3.375 g (08/10/23 0629)    LOS: 2 days   Shon Hale M.D on 08/10/2023 at 10:48 AM  Go to [email or web address] - for contact info  [facility] - Office  [phone]  If 7PM-7AM, please contact night-coverage [email or web address] 08/10/2023, 10:48 AM

## 2023-08-10 NOTE — Progress Notes (Addendum)
Pharmacy Antibiotic Note  Amanda Armstrong is a 78 y.o. female admitted on 08/07/2023 with  fever and fatigue .  Pharmacy has been consulted for daptomycin dosing.  Patient now growing citrobacter and enterococcus in her urine. ID has been consulted. Will adjust antibiotics for better coverage.  Plan: Daptomycin 500mg  q24 Check CK today then weekly Cefepime 2g q12 hours  Height: 4\' 10"  (147.3 cm) Weight: 66.4 kg (146 lb 6.2 oz) IBW/kg (Calculated) : 40.9  Temp (24hrs), Avg:97.7 F (36.5 C), Min:97.3 F (36.3 C), Max:98.3 F (36.8 C)  Recent Labs  Lab 08/07/23 2051 08/07/23 2231 08/08/23 0419 08/09/23 0457  WBC 8.0  --  7.4 4.8  CREATININE 1.45*  --  1.21* 1.08*  LATICACIDVEN 0.8 1.1  --   --     Estimated Creatinine Clearance: 34.1 mL/min (A) (by C-G formula based on SCr of 1.08 mg/dL (H)).    Allergies  Allergen Reactions   Iron Other (See Comments)    "upset stomach"   Oxycodone Other (See Comments)    "CX HER TO FELL LIKE SHE IS OUT OF HER BODY"   Sudafed [Pseudoephedrine Hcl] Other (See Comments)    "funny feeling"   Chlorhexidine Gluconate Rash     Cefepime and flagyl 1/20 in ED Zosyn 1/21>>1/23 Cefepime 1/23> Daptomycin 1/23>  1/20 BCX: ngtd 1/20 UCX: citrobacter - S to cefepime, cipro, imipenem; enterococcus - S to vanc MRSA +  Thank you for allowing pharmacy to be a part of this patient's care.  Sheppard Coil PharmD., BCPS Clinical Pharmacist 08/10/2023 12:20 PM

## 2023-08-11 DIAGNOSIS — R6521 Severe sepsis with septic shock: Secondary | ICD-10-CM | POA: Diagnosis not present

## 2023-08-11 DIAGNOSIS — A419 Sepsis, unspecified organism: Secondary | ICD-10-CM | POA: Diagnosis not present

## 2023-08-11 LAB — GLUCOSE, CAPILLARY
Glucose-Capillary: 75 mg/dL (ref 70–99)
Glucose-Capillary: 92 mg/dL (ref 70–99)
Glucose-Capillary: 121 mg/dL — ABNORMAL HIGH (ref 70–99)

## 2023-08-11 LAB — RENAL FUNCTION PANEL
Albumin: 2.4 g/dL — ABNORMAL LOW (ref 3.5–5.0)
Anion gap: 7 (ref 5–15)
BUN: 11 mg/dL (ref 8–23)
CO2: 22 mmol/L (ref 22–32)
Calcium: 8.3 mg/dL — ABNORMAL LOW (ref 8.9–10.3)
Chloride: 108 mmol/L (ref 98–111)
Creatinine, Ser: 1.12 mg/dL — ABNORMAL HIGH (ref 0.44–1.00)
GFR, Estimated: 50 mL/min — ABNORMAL LOW (ref >60)
Glucose, Bld: 84 mg/dL (ref 70–99)
Phosphorus: 3 mg/dL (ref 2.5–4.6)
Potassium: 3.3 mmol/L — ABNORMAL LOW (ref 3.5–5.1)
Sodium: 137 mmol/L (ref 135–145)

## 2023-08-11 LAB — CBC
HCT: 28.2 % — ABNORMAL LOW (ref 36.0–46.0)
Hemoglobin: 9.3 g/dL — ABNORMAL LOW (ref 12.0–15.0)
MCH: 30.2 pg (ref 26.0–34.0)
MCHC: 33 g/dL (ref 30.0–36.0)
MCV: 91.6 fL (ref 80.0–100.0)
Platelets: 102 10*3/uL — ABNORMAL LOW (ref 150–400)
RBC: 3.08 MIL/uL — ABNORMAL LOW (ref 3.87–5.11)
RDW: 15 % (ref 11.5–15.5)
WBC: 3.4 10*3/uL — ABNORMAL LOW (ref 4.0–10.5)
nRBC: 0 % (ref 0.0–0.2)

## 2023-08-11 LAB — CK: Total CK: 35 U/L — ABNORMAL LOW (ref 38–234)

## 2023-08-11 MED ORDER — CIPROFLOXACIN HCL 500 MG PO TABS: 500.0000 mg | ORAL_TABLET | Freq: Two times a day (BID) | ORAL | 0 refills | Status: AC

## 2023-08-11 MED ORDER — ASPIRIN 81 MG PO TBEC: 81.0000 mg | DELAYED_RELEASE_TABLET | Freq: Every day | ORAL | 12 refills | Status: AC

## 2023-08-11 MED ORDER — POTASSIUM CHLORIDE CRYS ER 20 MEQ PO TBCR
40.0000 meq | EXTENDED_RELEASE_TABLET | Freq: Once | ORAL | Status: AC
  Administered 2023-08-11: 40 meq via ORAL
  Filled 2023-08-11: qty 2

## 2023-08-11 MED ORDER — NICOTINE 14 MG/24HR TD PT24: 14.0000 mg | MEDICATED_PATCH | Freq: Every day | TRANSDERMAL | 0 refills | Status: AC

## 2023-08-11 MED ORDER — DAPTOMYCIN IV (FOR PTA / DISCHARGE USE ONLY): 500.0000 mg | INTRAVENOUS | 0 refills | Status: AC

## 2023-08-11 MED ORDER — POTASSIUM CHLORIDE CRYS ER 20 MEQ PO TBCR: 40.0000 meq | EXTENDED_RELEASE_TABLET | Freq: Once | ORAL | Status: DC

## 2023-08-11 MED ORDER — ORAL CARE MOUTH RINSE: 15.0000 mL | OROMUCOSAL | Status: DC | PRN

## 2023-08-11 MED ORDER — HYDROXYZINE HCL 25 MG PO TABS: 25.0000 mg | ORAL_TABLET | Freq: Three times a day (TID) | ORAL | Status: DC | PRN

## 2023-08-11 NOTE — Consult Note (Addendum)
[facility] Liaison Note  08/11/2023  Amanda Armstrong 03-Mar-1946 [id number]  Location: RN Hospital Liaison screened the patient remotely at [facility].  Insurance: [employer]   Amanda Armstrong is a 78 y.o. female who is a Primary Care Patient of [name], MD- [employer]. The patient was screened for 7 and 30 day readmission hospitalization with noted medium risk score for unplanned readmission risk with 2 IP and 2 ED in 6 months.  The patient was assessed for potential Care Management service needs for post hospital transition for care coordination. Review of patient's electronic medical record reveals patient was admitted for Septic Shock. Pt will discharged with [facility] services. Provider is not a Metallurgist for [employer].    [facility] Care Management/Population Health does not replace or interfere with any arrangements made by the Inpatient Transition of Care team.   For questions contact:   Elliot Cousin, RN, [facility] Liaison [place]   [facility], Population Health Office Hours MTWF  8:00 am-6:00 pm Direct Dial: [phone] mobile [phone] [Office toll free line] Office Hours are M-F 8:30 - 5 pm [email or web address]

## 2023-08-11 NOTE — Telephone Encounter (Addendum)
See below

## 2023-08-11 NOTE — Discharge Instructions (Signed)
1)Iv Daptomycin 500mg  daily for 12 days Via [facility] Line- Last Day of Therapy:  08/23/2023 Labs - Once weekly:  CBC/D, BMP, and CPK Labs - Once weekly: ESR and CRP  2)Avoid ibuprofen/Advil/Aleve/Motrin/Goody Powders/Naproxen/BC powders/Meloxicam/Diclofenac/Indomethacin and other Nonsteroidal anti-inflammatory medications as these will make you more likely to bleed and can cause stomach ulcers, can also cause Kidney problems.   3)You Have Refused SNF (skilled nursing facility rehab)--- Home health physical therapy and registered nurse will be arranged for you  4)You need oxygen at home at 2 L via nasal cannula continuously while awake and while asleep--- smoking or having open fires around oxygen can cause fire, significant injury and death  5)Do Not take Atorvastatin/Lipitor until finish your antibiotics on 08/23/2023

## 2023-08-11 NOTE — Consult Note (Signed)
Virtual Visit via Telephone/Video Note   I connected with Amanda Armstrong   On 08/11/2023 at 9:19 AM  by Video and verified that I am speaking with the correct person using two identifiers.   I discussed the limitations, risks, security and privacy concerns of performing an evaluation and management service by video and the availability of in person appointments.   Location:   Patient: [facility] Provider: Lucien Mons  Date of Admission:  08/07/2023   Total days of inpatient antibiotics 2        Reason for Consult: UTI    Principal Problem:   Septic shock (HCC) Active Problems:   UTI (urinary tract infection)   Chronic pain syndrome   Uncontrolled type 2 diabetes mellitus with hyperglycemia (HCC)   COPD GOLD ? / group B still smoking    Chronic respiratory failure with hypoxia (HCC)   Chronic hypoxic respiratory failure, on home oxygen therapy (HCC)   Tobacco use   Long term (current) use of opiate analgesic   Assessment: 78 year old female underwent TRU BT for bladder tumor resection 5 days prior to admission, history of recurrent UTIs admitted for: #Sepsis secondary to UTI - Urine cultures grew  40k colonies Citrobacter freundii, and 30k colonies Enterococcus raffinosus -Blood cultures remain negative -CT abdomen pelvis showed gas in the vagina consistent with prior study cannot exclude rectovaginal fistula.  Bladder wall appears thickened.  Clinical correlation to exclude cystitis.  - Seen by urology recommended 28 days of antibiotics - I spoke to patient she reports she feels well today.  Family at bedside.  Recommendations:  -Place PICC - DC pip-tazo - Start daptomycin IV, ciprofloxacin 500 bid to complete another 14 days of antibiotics for complicated UTI given recent instrumentation - ID will sign off OPAT ORDERS:  Diagnosis: UTI  Allergies  Allergen Reactions   Iron Other (See Comments)    "upset stomach"   Oxycodone Other (See Comments)    "CX HER TO FELL LIKE SHE IS OUT  OF HER BODY"   Sudafed [Pseudoephedrine Hcl] Other (See Comments)    "funny feeling"   Chlorhexidine Gluconate Rash     Discharge antibiotics to be given via PICC line:  Per pharmacy protocol daptomycin [id number]   Duration: 14 days End Date: 2/5  [facility] Care Per Protocol with [employer] Use: Home health RN for IV administration and teaching, line care and labs.    Labs weekly while on IV antibiotics: x__ CBC with differential __ BMP **TWICE WEEKLY ON VANCOMYCIN  x__ CMP __ CRP __ ESR __ Vancomycin trough TWICE WEEKLY __ CK  _x_ Please pull PIC at completion of IV antibiotics __ Please leave PIC in place until doctor has seen patient or been notified  Fax weekly labs to [phone]  Clinic Follow Up Appt: 2/5  @ [facility] with Sani Loiseau  Microbiology:   Antibiotics: Pip-tazo 1/20-22 Cefepime 1/20 Ampicillin 1/20 Metronidazole 1/20  Cultures: Blood 1/20 no growth Urine 1/20 40 thousand colonies Citrobacter freundii, 30,000 colonies Enterococcus raffinosus Other   HPI: Amanda Armstrong is a 78 y.o. female with past medical history significant for chartered on 1/16 discharged with Foley, recurrent UTI on suppressive antibiotics with Bactrim, CVA, dementia, CRT-D, heart failure, hypertension, diabetes, hypothyroidism brought in from home due to altered mental status and fever.  On arrival patient was afebrile.  WBC 7.4K.  CT abdomen pelvis showed gas in the vagina consistent with prior study cannot exclude rectovaginal fistula.  Bladder wall appears thickened.  Clinical correlation to exclude  cystitis.  Urine cultures grew Citrobacter freundii and Enterococcus (resistant to ampicillin) patient has been on pip-tazo.  ID engaged for antibiotic conditions.   Review of Systems: Review of Systems  All other systems reviewed and are negative.   Past Medical History:  Diagnosis Date   Anxiety    Arthritis    Bilateral Knee DJD   Chronic pain    Chronically on opiate therapy     Clostridium difficile carrier    pt stated that she has intermittent sx   Clostridium difficile infection    Daytime somnolence    Frequent falls    Headache(784.0)    SINUS HEADACHE OCCASSIONALLY   Hypertension    Hypothyroidism    Multifactorial gait disorder    Osteoporosis    Right foot drop    Snoring    Stroke (HCC)    Tobacco use    Vertigo    Weakness     Social History   Tobacco Use   Smoking status: Every Day    Current packs/day: 1.00    Average packs/day: 1 pack/day for 48.0 years (48.0 ttl pk-yrs)    Types: Cigarettes   Smokeless tobacco: Never   Tobacco comments:    12/29/14 1/2 PPD  Vaping Use   Vaping status: Never Used  Substance Use Topics   Alcohol use: No    Alcohol/week: 0.0 standard drinks of alcohol   Drug use: No    Family History  Problem Relation Age of Onset   Heart attack Mother    COPD Father    Arthritis Sister    Heart disease Brother    Scheduled Meds:  atorvastatin  20 mg Oral Q1200   ciprofloxacin  500 mg Oral BID   donepezil  5 mg Oral QHS   DULoxetine  30 mg Oral Daily   fluticasone furoate-vilanterol  1 puff Inhalation Daily   And   umeclidinium bromide  1 puff Inhalation Daily   insulin aspart  0-6 Units Subcutaneous TID WC   levothyroxine  75 mcg Oral Q0600   mupirocin ointment   Nasal BID   nicotine  14 mg Transdermal Daily   potassium chloride  40 mEq Oral Once   sodium chloride flush  10-40 mL Intracatheter Q12H   sodium chloride flush  3 mL Intravenous Q12H   Continuous Infusions:  DAPTOmycin Stopped (08/10/23 1905)   norepinephrine (LEVOPHED) Adult infusion Stopped (08/10/23 0625)   PRN Meds:.acetaminophen, LORazepam, melatonin, mouth rinse, polyethylene glycol, sodium chloride flush Allergies  Allergen Reactions   Iron Other (See Comments)    "upset stomach"   Oxycodone Other (See Comments)    "CX HER TO FELL LIKE SHE IS OUT OF HER BODY"   Sudafed [Pseudoephedrine Hcl] Other (See Comments)     "funny feeling"   Chlorhexidine Gluconate Rash    OBJECTIVE: Blood pressure 117/64, pulse 87, temperature 97.7 F (36.5 C), temperature source Oral, resp. rate (!) 22, height 4\' 10"  (1.473 m), weight 70.5 kg, SpO2 98%.    Lab Results Lab Results  Component Value Date   WBC 3.4 (L) 08/11/2023   HGB 9.3 (L) 08/11/2023   HCT 28.2 (L) 08/11/2023   MCV 91.6 08/11/2023   PLT 102 (L) 08/11/2023    Lab Results  Component Value Date   CREATININE 1.12 (H) 08/11/2023   BUN 11 08/11/2023   NA 137 08/11/2023   K 3.3 (L) 08/11/2023   CL 108 08/11/2023   CO2 22 08/11/2023    Lab Results  Component Value Date   ALT 23 08/07/2023   AST 23 08/07/2023   ALKPHOS 65 08/07/2023   BILITOT 1.0 08/07/2023       Danelle Earthly, MD [facility] 08/11/2023, 9:16 AM   I have personally spent 82 minutes involved in face-to-face and non-face-to-face activities for this patient on the day of the visit. Professional time spent includes the following activities: Preparing to see the patient (review of tests), Obtaining and/or reviewing separately obtained history (admission/discharge record), Performing a medically appropriate examination and/or evaluation , Ordering medications/tests/procedures, referring and communicating with other health care professionals, Documenting clinical information in the EMR, Independently interpreting results (not separately reported), Communicating results to the patient/family/caregiver, Counseling and educating the patient/family/caregiver and Care coordination (not separately reported).

## 2023-08-11 NOTE — Progress Notes (Signed)
PHARMACY CONSULT NOTE FOR:  OUTPATIENT  PARENTERAL ANTIBIOTIC THERAPY (OPAT)  Indication: Polymicrobial UTI Regimen: Daptomycin 500 mg IV every 24 hours and Ciprofloxacin 500 mg po every 12 hours End date: 08/23/23  IV antibiotic discharge orders are pended. To discharging provider:  please sign these orders via discharge navigator,  Select New Orders & click on the button choice - Manage This Unsigned Work.     Thank you for allowing pharmacy to be a part of this patient's care.  Celedonio Miyamoto, PharmD, BCIDP Clinical Pharmacist   08/11/2023 9:55 AM   **Pharmacist phone directory can now be found on [email or web address] ([email or web address]).  Listed under [facility].

## 2023-08-11 NOTE — TOC Transition Note (Signed)
Transition of Care [facility]) - Discharge Note   Patient Details  Name: Amanda Armstrong MRN: [id number] Date of Birth: 08-May-1946  [facility] CM/SW Contact:  Leitha Bleak, RN Phone Number: 08/11/2023, 12:08 PM   Clinical Narrative:   Patient discharging home. Patient need IV abx at home. Pam with [employer] came this morning to train Daughter. Daughter has to go to work, she will come this back after her shift a 5:30 to transport the patient home. Team updated. Pam with arrange [facility]  for home health.    Final next level of care: Home w Home Health Services Barriers to Discharge: Barriers Resolved   Patient Goals and CMS Choice Patient states their goals for this hospitalization and ongoing recovery are:: to return home. CMS Medicare.gov Compare Post Acute Care list provided to:: Patient Choice offered to / list presented to : Patient    Discharge Placement              Patient to be transferred to facility by: Daughter Name of family member notified: [name] Patient and family notified of of transfer: 08/11/23  Discharge Plan and Services Additional resources added to the After Visit Summary for         [facility] Arranged: PT   Date [facility] Agency Contacted: 08/08/23 Time HH Agency Contacted: [phone] Representative spoke with at [facility] Agency: Katrina ( [facility] program social work)  Social Drivers of Health (SDOH) Interventions SDOH Screenings   Food Insecurity: No Food Insecurity (08/08/2023)  Housing: Low Risk  (08/08/2023)  Transportation Needs: No Transportation Needs (08/08/2023)  Utilities: Not At Risk (08/08/2023)  Social Connections: Moderately Isolated (08/08/2023)  Tobacco Use: High Risk (08/07/2023)     Readmission Risk Interventions    08/11/2023   12:07 PM 08/08/2023    2:54 PM 03/24/2021    1:36 PM  Readmission Risk Prevention Plan  Post Dischage Appt   Complete  Medication Screening   Complete  Transportation Screening Complete Complete Complete  PCP or  Specialist Appt within 5-7 Days Complete Not Complete   Home Care Screening  Complete   Medication Review (RN CM)  Complete

## 2023-08-11 NOTE — Care Management Important Message (Signed)
Important Message  Patient Details  Name: Amanda Armstrong MRN: [id number] Date of Birth: [birth year]   Important Message Given:  Yes - Medicare IM     Corey Harold 08/11/2023, 12:37 PM

## 2023-08-11 NOTE — Discharge Summary (Addendum)
Amanda Armstrong, is a 78 y.o. female  DOB [birth year]  MRN [id number].  Admission date:  08/07/2023  Admitting Physician  Dolly Rias, MD  Discharge Date:  08/11/2023   Primary MD  [name], MD  Recommendations for primary care physician for things to follow:  1)Iv Daptomycin 500mg  daily for 12 days Via Picc Line- Last Day of Therapy:  08/23/2023 Labs - Once weekly:  CBC/D, BMP, and CPK Labs - Once weekly: ESR and CRP  2)Avoid ibuprofen/Advil/Aleve/Motrin/Goody Powders/Naproxen/BC powders/Meloxicam/Diclofenac/Indomethacin and other Nonsteroidal anti-inflammatory medications as these will make you more likely to bleed and can cause stomach ulcers, can also cause Kidney problems.   3)You Have Refused SNF (skilled nursing facility rehab)--- Home health physical therapy and registered nurse will be arranged for you  4)you need oxygen at home at 2 L via nasal cannula continuously while awake and while asleep--- smoking or having open fires around oxygen can cause fire, significant injury and death  5)Do Not take Atorvastatin/Lipitor until finish your antibiotics on 08/23/2023  Admission Diagnosis  Septic shock (HCC) [A41.9, R65.21]  Discharge Diagnosis  Septic shock (HCC) [A41.9, R65.21]    Principal Problem:   Septic shock (HCC) Active Problems:   UTI (urinary tract infection)   Chronic pain syndrome   Uncontrolled type 2 diabetes mellitus with hyperglycemia (HCC)   COPD GOLD ? / group B still smoking    Chronic respiratory failure with hypoxia (HCC)   Chronic hypoxic respiratory failure, on home oxygen therapy (HCC)   Tobacco use   Long term (current) use of opiate analgesic     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    pt stated that she has intermittent sx    Clostridium difficile infection    Daytime somnolence    Frequent falls    Headache(784.0)    SINUS HEADACHE OCCASSIONALLY   Hypertension    Hypothyroidism    Multifactorial gait disorder    Osteoporosis    Right foot drop    Snoring    Stroke (HCC)    Tobacco use    Vertigo    Weakness     Past Surgical History:  Procedure Laterality Date   CATARACT EXTRACTION W/PHACO Right 06/28/2016   Procedure: CATARACT EXTRACTION PHACO AND INTRAOCULAR LENS PLACEMENT (IOC);  Surgeon: Jethro Bolus, MD;  Location: [facility];  Service: Ophthalmology;  Laterality: Right;  CDE: 11.10   CATARACT EXTRACTION W/PHACO Left 07/26/2016   Procedure: CATARACT EXTRACTION PHACO AND INTRAOCULAR LENS PLACEMENT (IOC);  Surgeon: Jethro Bolus, MD;  Location: [facility];  Service: Ophthalmology;  Laterality: Left;  CDE: 8.88   CYSTOSCOPY N/A 08/03/2023   Procedure: CYSTOSCOPY;  Surgeon: Malen Gauze, MD;  Location: [facility];  Service: Urology;  Laterality: N/A;   JOINT REPLACEMENT  11/08/3010   right total knee   TOTAL KNEE ARTHROPLASTY  11/07/2011   Procedure:  TOTAL KNEE ARTHROPLASTY;  Surgeon: Nilda Simmer, MD;  Location: [facility];  Service: Orthopedics;  Laterality: Left;  DR Thurston Hole WANTS 90 MINUTES FOR THIS CASE   TOTAL KNEE ARTHROPLASTY Right 2011   TRANSURETHRAL RESECTION OF BLADDER TUMOR N/A 08/03/2023   Procedure: TRANSURETHRAL RESECTION OF BLADDER TUMOR  (TURBT) WITH GEMCITABINE;  Surgeon: Malen Gauze, MD;  Location: [facility];  Service: Urology;  Laterality: N/A;   TUBAL LIGATION  1982     HPI  from the history and physical done on the day of admission:   HPI:  Amanda Armstrong is a 78 y.o. 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  History provided by patient's daughter at the bedside due to  patient's somnolence.  Daughter reports that over the last few days has been more confused, today disoriented to situation, time.  She had a fever to 101F at home.  Otherwise complaining of right flank pain.  No other recent symptoms per daughter's report   Review of Systems:  ROS complete and negative except as marked above    Hospital Course:   Brief summary 78 y.o. female with hx of recent TURBT on 1/16 discharged with Foley, recurrent UTI on suppressive antibiotics with Bactrim, CVA, dementia, COPD, CHRF on 2 l O2, hypertension, diabetes, hypothyroidism admitted on 08/08/2023 with severe sepsis with septic shock due to CITROBACTER FREUNDII UTI--     -Assessment and Plan: 1)Sepsis with septic shock--due to CITROBACTER FREUNDII and ENTEROCOCCUS RAFFINOSUS CAUTI--POA -urine culture from 08/07/2023 with Citrobacter  and Enterococcus - Weaned off IV Levophed on 08/10/2023 -Treated with IV Zosyn -Discussed with patient's urologist and ID physician Dr. 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 [name] appreciated   5) acute anemia and thrombocytopenia and leukopenia--suspect drop in hemoglobin is due to hemodilution due to sepsis protocol IV fluids -Thrombocytopenia may be due to bone marrow  suppression from acute/severe infection -No bleeding concerns at this time -Weekly CBC with differential as ordered while on antibiotics   6) chronic hypoxic respiratory failure--- -stable -Continue supplemental oxygen at 2 L per nasal cannula   7)DM2-prior A1c 5.8 reflecting excellent diabetic control PTA -her Diabetes is mostly diet controlled    8)Tobacco Abuse--- cessation advised, may use nicotine patch  Disposition--Patient and her family Refused SNF (skilled nursing facility rehab)--- Home health physical therapy and registered nurse will be arranged for her   Chronic medical problems: History of CVA, dementia: Continue aspirin secondary prevention   Continue home donepezil Hypothyroidism--continue levothyroxine History recurrent UTI: Discontinue Bactrim -patient is currently on IV daptomycin and oral Cipro antibiotics above.  Discharge Condition: stable   Follow UP   Follow-up Information     [name], MD. Schedule an appointment as soon  as possible for a visit in 2 week(s).   Specialty: Internal Medicine Contact information: [address] [place] [address] [phone]                 Consults obtained - ID/urology  Diet and Activity recommendation:  As advised  Discharge Instructions   Discharge Instructions     [facility] pharmacist to adjust dose for Vancomycin, Aminoglycosides and other anti-infective therapies as requested by physician.   Complete by: As directed    [facility] to provide Cath Flo 2mg    Complete by: As directed    Administer for PICC line occlusion and as ordered by physician for other access device issues.   Anaphylaxis Kit: Provided to treat any anaphylactic reaction to the medication being provided to the patient if First Dose or when requested by physician   Complete by: As directed    Epinephrine 1mg /ml vial / amp: Administer 0.3mg  (0.79ml) subcutaneously once for moderate to severe  anaphylaxis, nurse to call physician and pharmacy when reaction occurs and call 911 if needed for immediate care   Diphenhydramine 50mg /ml IV vial: Administer 25-50mg  IV/IM PRN for first dose reaction, rash, itching, mild reaction, nurse to call physician and pharmacy when reaction occurs   Sodium Chloride 0.9% NS IV: Administer if needed for hypovolemic blood pressure drop or as ordered by physician after call to physician with anaphylactic reaction   Call MD for:  difficulty breathing, headache or visual disturbances   Complete by: As directed    Call MD for:  persistant dizziness or light-headedness   Complete by: As directed    Call MD for:  persistant nausea and vomiting   Complete by: As directed    Call MD for:  temperature >100.4   Complete by: As directed    Change dressing on IV access line weekly and PRN   Complete by: As directed    Diet - low sodium heart healthy   Complete by: As directed    Discharge instructions   Complete by: As directed    1)Iv Daptomycin 500mg  daily for 12 days Via Picc Line- Last Day of Therapy:  08/23/2023 Labs - Once weekly:  CBC/D, BMP, and CPK Labs - Once weekly: ESR and CRP  2)Avoid ibuprofen/Advil/Aleve/Motrin/Goody Powders/Naproxen/BC powders/Meloxicam/Diclofenac/Indomethacin and other Nonsteroidal anti-inflammatory medications as these will make you more likely to bleed and can cause stomach ulcers, can also cause Kidney problems.   3)You Have Refused SNF (skilled nursing facility rehab)--- Home health physical therapy and registered nurse will be arranged for you  4)you need oxygen at home at 2 L via nasal cannula continuously while awake and while asleep--- smoking or having open fires around oxygen can cause fire, significant injury and death  5)Do Not take Atorvastatin/Lipitor until finish your antibiotics on 08/23/2023   Flush IV access with Sodium Chloride 0.9% and Heparin 10 units/ml or 100 units/ml   Complete by: As directed     Home infusion instructions - [facility]   Complete by: As directed    Instructions: Flush IV access with Sodium Chloride 0.9% and Heparin 10units/ml or 100units/ml   Change dressing on IV access line: Weekly and PRN   Instructions Cath Flo 2mg : Administer for PICC Line occlusion and as ordered by physician for other access device   [facility] pharmacist to adjust dose for: Vancomycin, Aminoglycosides and other anti-infective therapies as requested by physician   Increase activity slowly   Complete by: As directed  Method of administration may be changed at the discretion of home infusion pharmacist based upon assessment of the patient and/or caregiver's ability to self-administer the medication ordered   Complete by: As directed    Outpatient Parenteral Antibiotic Therapy Information Antibiotic: Daptomycin (Cubicin) IVPB; Indications for use: Polymicrobial urinary tract infection; End Date: 08/23/2023   Complete by: As directed    Antibiotic: Daptomycin (Cubicin) IVPB   Indications for use: Polymicrobial urinary tract infection   End Date: 08/23/2023       Discharge Medications     Allergies as of 08/11/2023       Reactions   Iron Other (See Comments)   "upset stomach"   Oxycodone Other (See Comments)   "CX HER TO FELL LIKE SHE IS OUT OF HER BODY"   Sudafed [pseudoephedrine Hcl] Other (See Comments)   "funny feeling"   Chlorhexidine Gluconate Rash        Medication List     PAUSE taking these medications    atorvastatin 20 MG tablet Wait to take this until: August 23, 2023 Commonly known as: LIPITOR Take 20 mg by mouth daily at 12 noon.       STOP taking these medications    sulfamethoxazole-trimethoprim 400-80 MG tablet Commonly known as: BACTRIM   traMADol 50 MG tablet Commonly known as: Ultram       TAKE these medications    Accu-Chek Guide test strip Generic drug: glucose blood USE TEST STRIP TO CHECK GLUCOSE TWICE DAILY BEFORE  BREAKFAST AND  BEFORE BEDTIME   Accu-Chek Softclix Lancets lancets USE  TO CHECK GLUCOSE TWICE DAILY   acetaminophen 500 MG tablet Commonly known as: TYLENOL Take 1,000 mg by mouth every 6 (six) hours as needed for mild pain.   alendronate 70 MG tablet Commonly known as: FOSAMAX Take 70 mg by mouth every Sunday.   ALPRAZolam 1 MG tablet Commonly known as: XANAX Take 1 mg by mouth at bedtime.   aspirin EC 81 MG tablet Take 1 tablet (81 mg total) by mouth daily with breakfast. What changed: when to take this   bismuth subsalicylate 262 MG/15ML suspension Commonly known as: PEPTO BISMOL Take 30 mLs by mouth every 6 (six) hours as needed for indigestion.   blood glucose meter kit and supplies Dispense based on patient and insurance preference. Use up to four times daily as directed. (FOR ICD-10 E10.9, E11.9).   ciprofloxacin 500 MG tablet Commonly known as: CIPRO Take 1 tablet (500 mg total) by mouth 2 (two) times daily for 12 days.   daptomycin IVPB Commonly known as: CUBICIN Inject 500 mg into the vein daily for 12 days. Indication:  Polymicrobial urinary tract infection First Dose: Yes Last Day of Therapy:  08/23/2023 Labs - Once weekly:  CBC/D, BMP, and CPK Labs - Once weekly: ESR and CRP Method of administration: IV Push Method of administration may be changed at the discretion of home infusion pharmacist based upon assessment of the patient and/or caregiver's ability to self-administer the medication ordered.   DULoxetine 30 MG capsule Commonly known as: CYMBALTA Take 30 mg by mouth daily.   furosemide 20 MG tablet Commonly known as: LASIX Take 20 mg by mouth daily.   HYDROcodone-acetaminophen 10-325 MG tablet Commonly known as: NORCO Take 1 tablet by mouth every 4 (four) hours as needed for moderate pain.   hydrOXYzine 25 MG tablet Commonly known as: ATARAX Take 1 tablet (25 mg total) by mouth every 8 (eight) hours as needed for itching. What changed: when  to  take this   levothyroxine 75 MCG tablet Commonly known as: SYNTHROID Take 1 tablet (75 mcg total) by mouth daily before breakfast.   MUSCLE RUB EX Apply 1 Application topically daily as needed (pain).   nicotine 14 mg/24hr patch Commonly known as: NICODERM CQ - dosed in mg/24 hours Place 1 patch (14 mg total) onto the skin daily. Start taking on: August 12, 2023   OXYGEN Inhale 1.5 L/min into the lungs daily as needed (O2 sats below 90 and at bedtime).   Santyl 250 UNIT/GM ointment Generic drug: collagenase Apply 1 Application topically daily.   Trelegy Ellipta 200-62.5-25 MCG/ACT Aepb Generic drug: Fluticasone-Umeclidin-Vilant Inhale 1 puff into the lungs daily.   Vitamin D3 50 MCG (2000 UT) capsule Take 2,000 Units by mouth daily.               Discharge Care Instructions  (From admission, onward)           Start     Ordered   08/11/23 0000  Change dressing on IV access line weekly and PRN  (Home infusion instructions - [facility] )        08/11/23 1728           Major procedures and Radiology Reports - PLEASE review detailed and final reports for all details, in brief -   [place] EKG SITE RITE Result Date: 08/10/2023 If Site Rite image not attached, placement could not be confirmed due to current cardiac rhythm.  CT ABDOMEN PELVIS W CONTRAST Result Date: 08/07/2023 CLINICAL DATA:  Abdominal pain, sepsis EXAM: CT ABDOMEN AND PELVIS WITH CONTRAST TECHNIQUE: Multidetector CT imaging of the abdomen and pelvis was performed using the standard protocol following bolus administration of intravenous contrast. RADIATION DOSE REDUCTION: This exam was performed according to the departmental dose-optimization program which includes automated exposure control, adjustment of the mA and/or kV according to patient size and/or use of iterative reconstruction technique. CONTRAST:  75mL OMNIPAQUE IOHEXOL 300 MG/ML  SOLN COMPARISON:  05/06/2023 FINDINGS: Lower chest:  Bibasilar atelectasis. Coronary artery and aortic atherosclerosis. Hepatobiliary: Gallstones fill the gallbladder. No CT evidence of acute cholecystitis. No biliary ductal dilatation or focal hepatic abnormality. Pancreas: No focal abnormality or ductal dilatation. Spleen: No focal abnormality.  Normal size. Adrenals/Urinary Tract: Stable left adrenal nodule most compatible with adenoma. Right adrenal gland unremarkable. No stones or hydronephrosis. Small scattered bilateral renal cysts. No follow-up imaging recommended. Urinary bladder decompressed with Foley catheter in place. Bladder wall appears mildly thickened but difficult to evaluate due to decompressed state. Stomach/Bowel: Sigmoid diverticulosis. No active diverticulitis. Moderate stool burden throughout the colon. Stomach and small bowel decompressed, unremarkable. Vascular/Lymphatic: Aortoiliac atherosclerosis. No evidence of aneurysm or adenopathy. Reproductive: Calcified structure in the left adnexa is unchanged since prior study. Gas again noted within the vagina, similar to prior study. Other: No free fluid or free air. Musculoskeletal: Progressive compression fracture at L2, now moderate. No acute bony abnormality. IMPRESSION: Cholelithiasis.  No CT evidence of acute cholecystitis. Bibasilar atelectasis. Gas again seen within the vagina as seen on prior study. Cannot exclude rectovaginal fistula. Bladder wall appears thickened, but bladder is decompressed and difficult evaluate. Recommend clinical correlation to exclude cystitis. Sigmoid diverticulosis.  Moderate stool burden throughout the colon. Aortic atherosclerosis. Electronically Signed   By: Charlett Nose M.D.   On: 08/07/2023 23:09   DG Chest Portable 1 View Result Date: 08/07/2023 CLINICAL DATA:  Fever. Surgery to remove tumor from bladder a few days ago EXAM: PORTABLE  CHEST 1 VIEW COMPARISON:  05/06/2023 FINDINGS: Stable cardiomediastinal silhouette. Aortic atherosclerotic calcification.  Chronic elevation of the right hemidiaphragm. Diffuse interstitial prominence and peribronchial cuffing similar to prior. No focal consolidation, pleural effusion, or pneumothorax. IMPRESSION: Diffuse interstitial prominence and peribronchial cuffing similar to prior, likely bronchitis. Electronically Signed   By: Minerva Fester M.D.   On: 08/07/2023 21:36   Micro Results   Recent Results (from the past 240 hours)  Resp panel by RT-PCR (RSV, Flu A&B, Covid) Anterior Nasal Swab     Status: None   Collection Time: 08/07/23  8:49 PM   Specimen: Anterior Nasal Swab  Result Value Ref Range Status   SARS Coronavirus 2 by RT PCR NEGATIVE NEGATIVE Final    Comment: (NOTE) SARS-CoV-2 target nucleic acids are NOT DETECTED.  The SARS-CoV-2 RNA is generally detectable in upper respiratory specimens during the acute phase of infection. The lowest concentration of SARS-CoV-2 viral copies this assay can detect is 138 copies/mL. A negative result does not preclude SARS-Cov-2 infection and should not be used as the sole basis for treatment or other patient management decisions. A negative result may occur with  improper specimen collection/handling, submission of specimen other than nasopharyngeal swab, presence of viral mutation(s) within the areas targeted by this assay, and inadequate number of viral copies(<138 copies/mL). A negative result must be combined with clinical observations, patient history, and epidemiological information. The expected result is Negative.  Fact Sheet for Patients:  BloggerCourse.com  Fact Sheet for Healthcare Providers:  SeriousBroker.it  This test is no t yet approved or cleared by the [place] FDA and  has been authorized for detection and/or diagnosis of SARS-CoV-2 by FDA under an Emergency Use Authorization (EUA). This EUA will remain  in effect (meaning this test can be used) for the duration of  the COVID-19 declaration under Section 564(b)(1) of the Act, 21 U.S.C.section 360bbb-3(b)(1), unless the authorization is terminated  or revoked sooner.       Influenza A by PCR NEGATIVE NEGATIVE Final   Influenza B by PCR NEGATIVE NEGATIVE Final    Comment: (NOTE) The Xpert Xpress SARS-CoV-2/FLU/RSV plus assay is intended as an aid in the diagnosis of influenza from Nasopharyngeal swab specimens and should not be used as a sole basis for treatment. Nasal washings and aspirates are unacceptable for Xpert Xpress SARS-CoV-2/FLU/RSV testing.  Fact Sheet for Patients: BloggerCourse.com  Fact Sheet for Healthcare Providers: SeriousBroker.it  This test is not yet approved or cleared by the [place] FDA and has been authorized for detection and/or diagnosis of SARS-CoV-2 by FDA under an Emergency Use Authorization (EUA). This EUA will remain in effect (meaning this test can be used) for the duration of the COVID-19 declaration under Section 564(b)(1) of the Act, 21 U.S.C. section 360bbb-3(b)(1), unless the authorization is terminated or revoked.     Resp Syncytial Virus by PCR NEGATIVE NEGATIVE Final    Comment: (NOTE) Fact Sheet for Patients: BloggerCourse.com  Fact Sheet for Healthcare Providers: SeriousBroker.it  This test is not yet approved or cleared by the [place] FDA and has been authorized for detection and/or diagnosis of SARS-CoV-2 by FDA under an Emergency Use Authorization (EUA). This EUA will remain in effect (meaning this test can be used) for the duration of the COVID-19 declaration under Section 564(b)(1) of the Act, 21 U.S.C. section 360bbb-3(b)(1), unless the authorization is terminated or revoked.  Performed at [facility], [address] [place], [place] [address]   Blood Culture (routine x 2)  Status: None (Preliminary result)   Collection  Time: 08/07/23  8:51 PM   Specimen: BLOOD  Result Value Ref Range Status   Specimen Description BLOOD BLOOD RIGHT HAND  Final   Special Requests   Final    BOTTLES DRAWN AEROBIC AND ANAEROBIC Blood Culture adequate volume   Culture   Final    NO GROWTH 4 DAYS Performed at [facility], [address] [place], [place] [address]    Report Status PENDING  Incomplete  Blood Culture (routine x 2)     Status: None (Preliminary result)   Collection Time: 08/07/23  9:32 PM   Specimen: BLOOD  Result Value Ref Range Status   Specimen Description BLOOD LEFT ANTECUBITAL  Final   Special Requests   Final    BOTTLES DRAWN AEROBIC AND ANAEROBIC Blood Culture adequate volume   Culture   Final    NO GROWTH 4 DAYS Performed at [facility], [address] [place], [place] [address]    Report Status PENDING  Incomplete  Urine Culture     Status: Abnormal   Collection Time: 08/07/23 11:10 PM   Specimen: Urine, Catheterized  Result Value Ref Range Status   Specimen Description URINE, CATHETERIZED  Final   Special Requests NONE  Final   Culture (A)  Final    40,000 COLONIES/mL CITROBACTER FREUNDII 30,000 COLONIES/mL ENTEROCOCCUS RAFFINOSUS    Report Status 08/10/2023 FINAL  Final   Organism ID, Bacteria CITROBACTER FREUNDII (A)  Final   Organism ID, Bacteria ENTEROCOCCUS RAFFINOSUS (A)  Final      Susceptibility   Citrobacter freundii - MIC*    CEFEPIME 2 SENSITIVE Sensitive     CEFTRIAXONE >=64 RESISTANT Resistant     CIPROFLOXACIN <=0.25 SENSITIVE Sensitive     GENTAMICIN <=1 SENSITIVE Sensitive     IMIPENEM 0.5 SENSITIVE Sensitive     NITROFURANTOIN 256 RESISTANT Resistant     TRIMETH/SULFA >=320 RESISTANT Resistant     PIP/TAZO >=128 RESISTANT Resistant ug/mL    * 40,000 COLONIES/mL CITROBACTER FREUNDII   Enterococcus raffinosus - MIC*    AMPICILLIN 16 RESISTANT Resistant     LEVOFLOXACIN >=8 RESISTANT Resistant     NITROFURANTOIN 32 SENSITIVE Sensitive     VANCOMYCIN Value in next  row Sensitive      <=0.5 SENSITIVEPerformed at [facility], [address] [place], [place] [address]    * 30,000 COLONIES/mL ENTEROCOCCUS RAFFINOSUS  MRSA Next Gen by PCR, Nasal     Status: Abnormal   Collection Time: 08/08/23  1:42 AM   Specimen: Nasal Mucosa; Nasal Swab  Result Value Ref Range Status   MRSA by PCR Next Gen DETECTED (A) NOT DETECTED Final    Comment: RESULT CALLED TO, READ BACK BY AND VERIFIED WITH: ASHLEY SHELTON @ 0726 ON 08/08/23 C VARNER (NOTE) The GeneXpert MRSA Assay (FDA approved for NASAL specimens only), is one component of a comprehensive MRSA colonization surveillance program. It is not intended to diagnose MRSA infection nor to guide or monitor treatment for MRSA infections. Test performance is not FDA approved in patients less than 65 years old. Performed at [facility], [address] [place], [place] [address]    Today   Subjective    Amanda Armstrong today has no new complaints  -  Called and updated patient's daughter Ms Colette Ribas          Patient has been seen and examined prior to discharge   Objective   Blood pressure (!) 129/57, pulse 87,  temperature 97.7 F (36.5 C), temperature source Oral, resp. rate (!) 22, height 4\' 10"  (1.473 m), weight 70.5 kg, SpO2 98%.   Intake/Output Summary (Last 24 hours) at 08/11/2023 1729 Last data filed at 08/11/2023 1335 Gross per 24 hour  Intake 335.03 ml  Output 775 ml  Net -439.97 ml   Exam Gen:- Awake Alert, no acute distress  HEENT:- .AT, No sclera icterus Neck-Supple Neck,No JVD,.  Lungs-  CTAB , good air movement bilaterally CV- S1, S2 normal, regular Abd-  +ve B.Sounds, Abd Soft, No tenderness, no CVA tenderness Extremity/Skin:- No  edema,   good pulses Psych-affect is appropriate, oriented x3 Neuro-generalized weakness, no new focal deficits, no tremors GU--Foley in situ--placed on 08/03/2023, changed on admission on 08/08/2023 MSK-left upper extremity PICC line in situ   Data  Review   CBC w Diff:  Lab Results  Component Value Date   WBC 3.4 (L) 08/11/2023   HGB 9.3 (L) 08/11/2023   HCT 28.2 (L) 08/11/2023   PLT 102 (L) 08/11/2023   LYMPHOPCT 5 08/07/2023   MONOPCT 1 08/07/2023   EOSPCT 0 08/07/2023   BASOPCT 0 08/07/2023   CMP:  Lab Results  Component Value Date   NA 137 08/11/2023   NA 141 02/22/2023   K 3.3 (L) 08/11/2023   CL 108 08/11/2023   CO2 22 08/11/2023   BUN 11 08/11/2023   BUN 20 02/22/2023   CREATININE 1.12 (H) 08/11/2023   GLU 190 03/30/2021   PROT 6.7 08/07/2023   PROT 7.2 02/22/2023   ALBUMIN 2.4 (L) 08/11/2023   ALBUMIN 4.4 02/22/2023   BILITOT 1.0 08/07/2023   BILITOT 0.3 02/22/2023   ALKPHOS 65 08/07/2023   AST 23 08/07/2023   ALT 23 08/07/2023  .  Total Discharge time is about 33 minutes  Shon Hale M.D on 08/11/2023 at 5:29 PM  Go to [email or web address] -  for contact info  [facility] - Office  [phone]

## 2023-08-12 DIAGNOSIS — R6521 Severe sepsis with septic shock: Secondary | ICD-10-CM | POA: Diagnosis not present

## 2023-08-12 DIAGNOSIS — A419 Sepsis, unspecified organism: Secondary | ICD-10-CM | POA: Diagnosis not present

## 2023-08-12 LAB — CULTURE, BLOOD (ROUTINE X 2)
Culture: NO GROWTH
Culture: NO GROWTH
Special Requests: ADEQUATE
Special Requests: ADEQUATE

## 2023-08-14 ENCOUNTER — Ambulatory Visit: Payer: 59 | Admitting: Podiatry

## 2023-08-15 ENCOUNTER — Telehealth: Payer: Self-pay

## 2023-08-15 DIAGNOSIS — Z7983 Long term (current) use of bisphosphonates: Secondary | ICD-10-CM | POA: Diagnosis not present

## 2023-08-15 DIAGNOSIS — K5732 Diverticulitis of large intestine without perforation or abscess without bleeding: Secondary | ICD-10-CM | POA: Diagnosis not present

## 2023-08-15 DIAGNOSIS — Z556 Problems related to health literacy: Secondary | ICD-10-CM | POA: Diagnosis not present

## 2023-08-15 DIAGNOSIS — M159 Polyosteoarthritis, unspecified: Secondary | ICD-10-CM | POA: Diagnosis not present

## 2023-08-15 DIAGNOSIS — E039 Hypothyroidism, unspecified: Secondary | ICD-10-CM | POA: Diagnosis not present

## 2023-08-15 DIAGNOSIS — Z8616 Personal history of COVID-19: Secondary | ICD-10-CM | POA: Diagnosis not present

## 2023-08-15 DIAGNOSIS — G894 Chronic pain syndrome: Secondary | ICD-10-CM | POA: Diagnosis not present

## 2023-08-15 DIAGNOSIS — R296 Repeated falls: Secondary | ICD-10-CM | POA: Diagnosis not present

## 2023-08-15 DIAGNOSIS — Z7982 Long term (current) use of aspirin: Secondary | ICD-10-CM | POA: Diagnosis not present

## 2023-08-15 DIAGNOSIS — G319 Degenerative disease of nervous system, unspecified: Secondary | ICD-10-CM | POA: Diagnosis not present

## 2023-08-15 DIAGNOSIS — N390 Urinary tract infection, site not specified: Secondary | ICD-10-CM | POA: Diagnosis not present

## 2023-08-15 DIAGNOSIS — Z9981 Dependence on supplemental oxygen: Secondary | ICD-10-CM | POA: Diagnosis not present

## 2023-08-15 DIAGNOSIS — E1122 Type 2 diabetes mellitus with diabetic chronic kidney disease: Secondary | ICD-10-CM | POA: Diagnosis not present

## 2023-08-15 DIAGNOSIS — I700 Atherosclerosis of aorta: Secondary | ICD-10-CM | POA: Diagnosis not present

## 2023-08-15 DIAGNOSIS — I129 Hypertensive chronic kidney disease with stage 1 through stage 4 chronic kidney disease, or unspecified chronic kidney disease: Secondary | ICD-10-CM | POA: Diagnosis not present

## 2023-08-15 DIAGNOSIS — F17210 Nicotine dependence, cigarettes, uncomplicated: Secondary | ICD-10-CM | POA: Diagnosis not present

## 2023-08-15 DIAGNOSIS — J449 Chronic obstructive pulmonary disease, unspecified: Secondary | ICD-10-CM | POA: Diagnosis not present

## 2023-08-15 DIAGNOSIS — K5900 Constipation, unspecified: Secondary | ICD-10-CM | POA: Diagnosis not present

## 2023-08-15 DIAGNOSIS — N183 Chronic kidney disease, stage 3 unspecified: Secondary | ICD-10-CM | POA: Diagnosis not present

## 2023-08-15 DIAGNOSIS — Z87440 Personal history of urinary (tract) infections: Secondary | ICD-10-CM | POA: Diagnosis not present

## 2023-08-15 DIAGNOSIS — J9611 Chronic respiratory failure with hypoxia: Secondary | ICD-10-CM | POA: Diagnosis not present

## 2023-08-15 DIAGNOSIS — E46 Unspecified protein-calorie malnutrition: Secondary | ICD-10-CM | POA: Diagnosis not present

## 2023-08-15 DIAGNOSIS — M810 Age-related osteoporosis without current pathological fracture: Secondary | ICD-10-CM | POA: Diagnosis not present

## 2023-08-15 NOTE — Telephone Encounter (Signed)
Pt daughter called and LVM Pt catheter needs be removed daughter states cath was supposed to be removed almost a week ago, but mother was sent to ED so missed her scheduled appt. Called Pt back to let her know we had to have the okay from MD McKenzie but will call back tomorrow to have appt scheduled if okay from MD Pt daughter was offered an appt for 01/28 (previous telephone encounter)  states 8:30AM for appt is too early will call Pt back to reschedule after ok from MD

## 2023-08-16 ENCOUNTER — Inpatient Hospital Stay: Payer: Self-pay | Admitting: Internal Medicine

## 2023-08-16 DIAGNOSIS — M159 Polyosteoarthritis, unspecified: Secondary | ICD-10-CM | POA: Diagnosis not present

## 2023-08-16 DIAGNOSIS — I129 Hypertensive chronic kidney disease with stage 1 through stage 4 chronic kidney disease, or unspecified chronic kidney disease: Secondary | ICD-10-CM | POA: Diagnosis not present

## 2023-08-16 DIAGNOSIS — I700 Atherosclerosis of aorta: Secondary | ICD-10-CM | POA: Diagnosis not present

## 2023-08-16 DIAGNOSIS — K5900 Constipation, unspecified: Secondary | ICD-10-CM | POA: Diagnosis not present

## 2023-08-16 DIAGNOSIS — G319 Degenerative disease of nervous system, unspecified: Secondary | ICD-10-CM | POA: Diagnosis not present

## 2023-08-16 DIAGNOSIS — N183 Chronic kidney disease, stage 3 unspecified: Secondary | ICD-10-CM | POA: Diagnosis not present

## 2023-08-16 DIAGNOSIS — J9611 Chronic respiratory failure with hypoxia: Secondary | ICD-10-CM | POA: Diagnosis not present

## 2023-08-16 DIAGNOSIS — Z7982 Long term (current) use of aspirin: Secondary | ICD-10-CM | POA: Diagnosis not present

## 2023-08-16 DIAGNOSIS — M810 Age-related osteoporosis without current pathological fracture: Secondary | ICD-10-CM | POA: Diagnosis not present

## 2023-08-16 DIAGNOSIS — E1122 Type 2 diabetes mellitus with diabetic chronic kidney disease: Secondary | ICD-10-CM | POA: Diagnosis not present

## 2023-08-16 DIAGNOSIS — Z87440 Personal history of urinary (tract) infections: Secondary | ICD-10-CM | POA: Diagnosis not present

## 2023-08-16 DIAGNOSIS — Z7983 Long term (current) use of bisphosphonates: Secondary | ICD-10-CM | POA: Diagnosis not present

## 2023-08-16 DIAGNOSIS — J449 Chronic obstructive pulmonary disease, unspecified: Secondary | ICD-10-CM | POA: Diagnosis not present

## 2023-08-16 DIAGNOSIS — F17210 Nicotine dependence, cigarettes, uncomplicated: Secondary | ICD-10-CM | POA: Diagnosis not present

## 2023-08-16 DIAGNOSIS — Z8616 Personal history of COVID-19: Secondary | ICD-10-CM | POA: Diagnosis not present

## 2023-08-16 DIAGNOSIS — Z556 Problems related to health literacy: Secondary | ICD-10-CM | POA: Diagnosis not present

## 2023-08-16 DIAGNOSIS — Z9981 Dependence on supplemental oxygen: Secondary | ICD-10-CM | POA: Diagnosis not present

## 2023-08-16 DIAGNOSIS — K5732 Diverticulitis of large intestine without perforation or abscess without bleeding: Secondary | ICD-10-CM | POA: Diagnosis not present

## 2023-08-16 DIAGNOSIS — E46 Unspecified protein-calorie malnutrition: Secondary | ICD-10-CM | POA: Diagnosis not present

## 2023-08-16 DIAGNOSIS — E039 Hypothyroidism, unspecified: Secondary | ICD-10-CM | POA: Diagnosis not present

## 2023-08-16 DIAGNOSIS — G894 Chronic pain syndrome: Secondary | ICD-10-CM | POA: Diagnosis not present

## 2023-08-16 DIAGNOSIS — R296 Repeated falls: Secondary | ICD-10-CM | POA: Diagnosis not present

## 2023-08-16 NOTE — Progress Notes (Unsigned)
Name: [name] DOB: Mar 11, 1946 MRN: [id number]  Diagnoses: Post-operative state  HPI: Amanda Armstrong presents post-operatively. She is accompanied by her daughter and a caretaker, who assisted with providing history due to patient's dementia.  Recent history: > 08/03/2023:  - Underwent TURBT and Gemcitabine bladder instillation by Dr. [name]. - Pathology: "Urothelium with keratinizing squamous cell metaplasia. Submucosa with prominent acute and chronic inflammation." - Sent home with Foley catheter.  > 08/07/2023-08/11/2023:  - Admitted for urosepsis.  - Urine culture on 08/07/2023 positive for Citrobacter freundii and Enterococcus raffinosus.  - Foley catheter exchanged on 08/08/2023. - Discharged on oral Cipro 500 mg 2x/day through 08/23/2023 and IV Daptomycin 500 mg daily x12 days via PICC line.  Postop course: Her daughter reports the catheter has been draining well.  Patient denies acute flank pain, abdominal pain, or fevers.   Fall Screening: Do you usually have a device to assist in your mobility? Yes   Medications: Current Outpatient Medications  Medication Sig Dispense Refill   ACCU-CHEK GUIDE test strip USE TEST STRIP TO CHECK GLUCOSE TWICE DAILY BEFORE BREAKFAST AND  BEFORE BEDTIME 200 each 2   Accu-Chek Softclix Lancets lancets USE  TO CHECK GLUCOSE TWICE DAILY 100 each 2   acetaminophen (TYLENOL) 500 MG tablet Take 1,000 mg by mouth every 6 (six) hours as needed for mild pain.     alendronate (FOSAMAX) 70 MG tablet Take 70 mg by mouth every Sunday.     ALPRAZolam (XANAX) 1 MG tablet Take 1 mg by mouth at bedtime.     aspirin EC 81 MG tablet Take 1 tablet (81 mg total) by mouth daily with breakfast. 30 tablet 12   [Paused] atorvastatin (LIPITOR) 20 MG tablet Take 20 mg by mouth daily at 12 noon.     bismuth subsalicylate (PEPTO BISMOL) 262 MG/15ML suspension Take 30 mLs by mouth every 6 (six) hours as needed for indigestion.     blood glucose meter kit and  supplies Dispense based on patient and insurance preference. Use up to four times daily as directed. (FOR ICD-10 E10.9, E11.9). 1 each 0   Cholecalciferol (VITAMIN D3) 50 MCG (2000 UT) capsule Take 2,000 Units by mouth daily.     ciprofloxacin (CIPRO) 500 MG tablet Take 1 tablet (500 mg total) by mouth 2 (two) times daily for 12 days. 24 tablet 0   daptomycin (CUBICIN) IVPB Inject 500 mg into the vein daily for 12 days. Indication:  Polymicrobial urinary tract infection First Dose: Yes Last Day of Therapy:  08/23/2023 Labs - Once weekly:  CBC/D, BMP, and CPK Labs - Once weekly: ESR and CRP Method of administration: IV Push Method of administration may be changed at the discretion of home infusion pharmacist based upon assessment of the patient and/or caregiver's ability to self-administer the medication ordered. 12 Units 0   DULoxetine (CYMBALTA) 30 MG capsule Take 30 mg by mouth daily.     furosemide (LASIX) 20 MG tablet Take 20 mg by mouth daily.     HYDROcodone-acetaminophen (NORCO) 10-325 MG tablet Take 1 tablet by mouth every 4 (four) hours as needed for moderate pain.     hydrOXYzine (ATARAX) 25 MG tablet Take 1 tablet (25 mg total) by mouth every 8 (eight) hours as needed for itching.     levothyroxine (SYNTHROID) 75 MCG tablet Take 1 tablet (75 mcg total) by mouth daily before breakfast. 90 tablet 1   Menthol-Methyl Salicylate (MUSCLE RUB EX) Apply 1 Application topically daily as needed (pain).  nicotine (NICODERM CQ - DOSED IN MG/24 HOURS) 14 mg/24hr patch Place 1 patch (14 mg total) onto the skin daily. 28 patch 0   OXYGEN Inhale 1.5 L/min into the lungs daily as needed (O2 sats below 90 and at bedtime).     SANTYL 250 UNIT/GM ointment Apply 1 Application topically daily.     TRELEGY ELLIPTA 200-62.5-25 MCG/ACT AEPB Inhale 1 puff into the lungs daily.     No current facility-administered medications for this visit.    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    pt stated that she has intermittent sx   Clostridium difficile infection    Daytime somnolence    Frequent falls    Headache(784.0)    SINUS HEADACHE OCCASSIONALLY   Hypertension    Hypothyroidism    Multifactorial gait disorder    Osteoporosis    Right foot drop    Snoring    Stroke (HCC)    Tobacco use    Vertigo    Weakness    Past Surgical History:  Procedure Laterality Date   CATARACT EXTRACTION W/PHACO Right 06/28/2016   Procedure: CATARACT EXTRACTION PHACO AND INTRAOCULAR LENS PLACEMENT (IOC);  Surgeon: Jethro Bolus, MD;  Location: [facility];  Service: Ophthalmology;  Laterality: Right;  CDE: 11.10   CATARACT EXTRACTION W/PHACO Left 07/26/2016   Procedure: CATARACT EXTRACTION PHACO AND INTRAOCULAR LENS PLACEMENT (IOC);  Surgeon: Jethro Bolus, MD;  Location: [facility];  Service: Ophthalmology;  Laterality: Left;  CDE: 8.88   CYSTOSCOPY N/A 08/03/2023   Procedure: CYSTOSCOPY;  Surgeon: Malen Gauze, MD;  Location: [facility];  Service: Urology;  Laterality: N/A;   JOINT REPLACEMENT  11/08/3010   right total knee   TOTAL KNEE ARTHROPLASTY  11/07/2011   Procedure: TOTAL KNEE ARTHROPLASTY;  Surgeon: Nilda Simmer, MD;  Location: [facility];  Service: Orthopedics;  Laterality: Left;  DR Thurston Hole WANTS 90 MINUTES FOR THIS CASE   TOTAL KNEE ARTHROPLASTY Right 2011   TRANSURETHRAL RESECTION OF BLADDER TUMOR N/A 08/03/2023   Procedure: TRANSURETHRAL RESECTION OF BLADDER TUMOR  (TURBT) WITH GEMCITABINE;  Surgeon: Malen Gauze, MD;  Location: [facility];  Service: Urology;  Laterality: N/A;   TUBAL LIGATION   1982   Family History  Problem Relation Age of Onset   Heart attack Mother    COPD Father    Arthritis Sister    Heart disease Brother    Social History   Socioeconomic History   Marital status: Married    Spouse name: Fayrene Fearing   Number of children: 4   Years of education: High Schoo   Highest education level: Not on file  Occupational History   Not on file  Tobacco Use   Smoking status: Every Day    Current packs/day: 1.00    Average packs/day: 1 pack/day for 48.0 years (48.0 ttl pk-yrs)    Types: Cigarettes   Smokeless tobacco: Never   Tobacco comments:    12/29/14 1/2 PPD  Vaping Use  Vaping status: Never Used  Substance and Sexual Activity   Alcohol use: No    Alcohol/week: 0.0 standard drinks of alcohol   Drug use: No   Sexual activity: Never    Birth control/protection: Surgical  Other Topics Concern   Not on file  Social History Narrative   1 cup of coffee a day    Social Drivers of Corporate investment banker Strain: Not on file  Food Insecurity: No Food Insecurity (08/08/2023)   Hunger Vital Sign    Worried About Running Out of Food in the Last Year: Never true    Ran Out of Food in the Last Year: Never true  Transportation Needs: No Transportation Needs (08/08/2023)   PRAPARE - Administrator, Civil Service (Medical): No    Lack of Transportation (Non-Medical): No  Physical Activity: Not on file  Stress: Not on file  Social Connections: Moderately Isolated (08/08/2023)   Social Connection and Isolation Panel [NHANES]    Frequency of Communication with Friends and Family: More than three times a week    Frequency of Social Gatherings with Friends and Family: Never    Attends Religious Services: Never    Database administrator or Organizations: No    Attends Banker Meetings: Never    Marital Status: Married  Catering manager Violence: Not At Risk (08/08/2023)   Humiliation, Afraid, Rape, and Kick questionnaire    Fear of Current  or Ex-Partner: No    Emotionally Abused: No    Physically Abused: No    Sexually Abused: No    SUBJECTIVE  Review of Systems limited due to patient dementia GU: As per HPI.  OBJECTIVE Vitals:   08/17/23 1210  BP: 114/71  Pulse: 86   There is no height or weight on file to calculate BMI.  Physical Examination Constitutional: No obvious distress; patient is non-toxic appearing  Cardiovascular: No visible lower extremity edema.  Respiratory: The patient does not have audible wheezing/stridor; respirations do not appear labored  Gastrointestinal: Abdomen non-distended Musculoskeletal: Normal ROM of UEs  Skin: No obvious rashes/open sores  Neurologic: CN 2-12 grossly intact Psychiatric: Answered questions appropriately with normal affect  Hematologic/Lymphatic/Immunologic: No obvious bruises or sites of spontaneous bleeding   ASSESSMENT Bladder mass - Plan: Bladder Voiding Trial  Postop check - Plan: Bladder Voiding Trial  Hospital discharge follow-up - Plan: Bladder Voiding Trial  Sepsis, due to unspecified organism, unspecified whether acute organ dysfunction present [facility]) - Plan: Bladder Voiding Trial  Foley catheter in place - Plan: Bladder Voiding Trial  We reviewed the operative procedures and pathology results. Voiding trial was impaired due to bladder spasms. Advised to increase fluid intake for the next few hours then return this afternoon for PVR check to ensure adequate bladder emptying. We discussed likely recommendation for her to start a daily preventative antibiotic after completing her current acute courses of oral Cipro and IV Daptomycin; will determine plan for UTI prevention after her Infectious Disease appointment on 08/23/2023. We agreed to plan for follow up in 3 months with cystoscopy for surveillance. Patient /daughter verbalized understanding of and agreement with current plan. All questions were answered.  PLAN Advised the following: Foley catheter  discontinued. PVR check later today. Complete oral Cipro and IV Daptomycin as prescribed. Follow up with Infectious Disease on 08/23/2023 as scheduled.  Return in about 3 months (around 11/15/2023) for f/u with Dr. [name] with surveillance cystoscopy.   Orders Placed This Encounter  Procedures  Bladder Voiding Trial   Total time spent caring for the patient today was over 30 minutes. This includes time spent on the date of the visit reviewing the patient's chart before the visit, time spent during the visit, and time spent after the visit on documentation. Over 50% of that time was spent in face-to-face time with this patient for direct counseling. E&M based on time and complexity of medical decision making.  It has been explained that the patient is to follow regularly with their PCP in addition to all other providers involved in their care and to follow instructions provided by these respective offices. Patient advised to contact urology clinic if any urologic-pertaining questions, concerns, new symptoms or problems arise in the interim period.  There are no Patient Instructions on file for this visit.  Electronically signed by:  Donnita Falls, MSN, FNP-C, CUNP 08/17/2023 12:36 PM

## 2023-08-17 ENCOUNTER — Ambulatory Visit: Payer: 59 | Admitting: Urology

## 2023-08-17 VITALS — BP 114/71 | HR 86

## 2023-08-17 DIAGNOSIS — G894 Chronic pain syndrome: Secondary | ICD-10-CM | POA: Diagnosis not present

## 2023-08-17 DIAGNOSIS — E039 Hypothyroidism, unspecified: Secondary | ICD-10-CM | POA: Diagnosis not present

## 2023-08-17 DIAGNOSIS — Z8619 Personal history of other infectious and parasitic diseases: Secondary | ICD-10-CM

## 2023-08-17 DIAGNOSIS — Z978 Presence of other specified devices: Secondary | ICD-10-CM

## 2023-08-17 DIAGNOSIS — Z7983 Long term (current) use of bisphosphonates: Secondary | ICD-10-CM | POA: Diagnosis not present

## 2023-08-17 DIAGNOSIS — A419 Sepsis, unspecified organism: Secondary | ICD-10-CM

## 2023-08-17 DIAGNOSIS — I129 Hypertensive chronic kidney disease with stage 1 through stage 4 chronic kidney disease, or unspecified chronic kidney disease: Secondary | ICD-10-CM | POA: Diagnosis not present

## 2023-08-17 DIAGNOSIS — F17210 Nicotine dependence, cigarettes, uncomplicated: Secondary | ICD-10-CM | POA: Diagnosis not present

## 2023-08-17 DIAGNOSIS — N3289 Other specified disorders of bladder: Secondary | ICD-10-CM

## 2023-08-17 DIAGNOSIS — Z8616 Personal history of COVID-19: Secondary | ICD-10-CM | POA: Diagnosis not present

## 2023-08-17 DIAGNOSIS — N183 Chronic kidney disease, stage 3 unspecified: Secondary | ICD-10-CM | POA: Diagnosis not present

## 2023-08-17 DIAGNOSIS — J449 Chronic obstructive pulmonary disease, unspecified: Secondary | ICD-10-CM | POA: Diagnosis not present

## 2023-08-17 DIAGNOSIS — R296 Repeated falls: Secondary | ICD-10-CM | POA: Diagnosis not present

## 2023-08-17 DIAGNOSIS — Z9981 Dependence on supplemental oxygen: Secondary | ICD-10-CM | POA: Diagnosis not present

## 2023-08-17 DIAGNOSIS — M159 Polyosteoarthritis, unspecified: Secondary | ICD-10-CM | POA: Diagnosis not present

## 2023-08-17 DIAGNOSIS — Z7982 Long term (current) use of aspirin: Secondary | ICD-10-CM | POA: Diagnosis not present

## 2023-08-17 DIAGNOSIS — Z87440 Personal history of urinary (tract) infections: Secondary | ICD-10-CM | POA: Diagnosis not present

## 2023-08-17 DIAGNOSIS — Z556 Problems related to health literacy: Secondary | ICD-10-CM | POA: Diagnosis not present

## 2023-08-17 DIAGNOSIS — I700 Atherosclerosis of aorta: Secondary | ICD-10-CM | POA: Diagnosis not present

## 2023-08-17 DIAGNOSIS — M810 Age-related osteoporosis without current pathological fracture: Secondary | ICD-10-CM | POA: Diagnosis not present

## 2023-08-17 DIAGNOSIS — E1122 Type 2 diabetes mellitus with diabetic chronic kidney disease: Secondary | ICD-10-CM | POA: Diagnosis not present

## 2023-08-17 DIAGNOSIS — Z09 Encounter for follow-up examination after completed treatment for conditions other than malignant neoplasm: Secondary | ICD-10-CM

## 2023-08-17 DIAGNOSIS — K5732 Diverticulitis of large intestine without perforation or abscess without bleeding: Secondary | ICD-10-CM | POA: Diagnosis not present

## 2023-08-17 DIAGNOSIS — Z87448 Personal history of other diseases of urinary system: Secondary | ICD-10-CM | POA: Diagnosis not present

## 2023-08-17 DIAGNOSIS — E46 Unspecified protein-calorie malnutrition: Secondary | ICD-10-CM | POA: Diagnosis not present

## 2023-08-17 DIAGNOSIS — J9611 Chronic respiratory failure with hypoxia: Secondary | ICD-10-CM | POA: Diagnosis not present

## 2023-08-17 DIAGNOSIS — K5900 Constipation, unspecified: Secondary | ICD-10-CM | POA: Diagnosis not present

## 2023-08-17 DIAGNOSIS — G319 Degenerative disease of nervous system, unspecified: Secondary | ICD-10-CM | POA: Diagnosis not present

## 2023-08-17 NOTE — Progress Notes (Signed)
Fill and Pull Catheter Removal  Patient is present today for a catheter removal.  Patient was cleaned and prepped in a sterile fashion 20 ml of sterile water/ saline was instilled into the bladder (patient had multiple bladder spam) when the patient felt the urge to urinate. 10ml of water was then drained from the balloon.  A 14FR foley cath was removed from the bladder no complications were noted .  Patient as then given some time to void on their own.  Patient cannot void  0 ml on their own after some time due to bladder spasm, Per sarah pull cath and patient return at 3 pm for PVR.  Patient tolerated well.  Performed by: Kennyth Lose, CMA  Follow up/ Additional notes: PVR @3  pm

## 2023-08-19 DIAGNOSIS — A419 Sepsis, unspecified organism: Secondary | ICD-10-CM | POA: Diagnosis not present

## 2023-08-19 DIAGNOSIS — R6521 Severe sepsis with septic shock: Secondary | ICD-10-CM | POA: Diagnosis not present

## 2023-08-21 DIAGNOSIS — Z8616 Personal history of COVID-19: Secondary | ICD-10-CM | POA: Diagnosis not present

## 2023-08-21 DIAGNOSIS — M159 Polyosteoarthritis, unspecified: Secondary | ICD-10-CM | POA: Diagnosis not present

## 2023-08-21 DIAGNOSIS — N183 Chronic kidney disease, stage 3 unspecified: Secondary | ICD-10-CM | POA: Diagnosis not present

## 2023-08-21 DIAGNOSIS — Z87440 Personal history of urinary (tract) infections: Secondary | ICD-10-CM | POA: Diagnosis not present

## 2023-08-21 DIAGNOSIS — Z7983 Long term (current) use of bisphosphonates: Secondary | ICD-10-CM | POA: Diagnosis not present

## 2023-08-21 DIAGNOSIS — R5381 Other malaise: Secondary | ICD-10-CM | POA: Diagnosis not present

## 2023-08-21 DIAGNOSIS — Z9981 Dependence on supplemental oxygen: Secondary | ICD-10-CM | POA: Diagnosis not present

## 2023-08-21 DIAGNOSIS — E46 Unspecified protein-calorie malnutrition: Secondary | ICD-10-CM | POA: Diagnosis not present

## 2023-08-21 DIAGNOSIS — Z556 Problems related to health literacy: Secondary | ICD-10-CM | POA: Diagnosis not present

## 2023-08-21 DIAGNOSIS — J449 Chronic obstructive pulmonary disease, unspecified: Secondary | ICD-10-CM | POA: Diagnosis not present

## 2023-08-21 DIAGNOSIS — J9611 Chronic respiratory failure with hypoxia: Secondary | ICD-10-CM | POA: Diagnosis not present

## 2023-08-21 DIAGNOSIS — G894 Chronic pain syndrome: Secondary | ICD-10-CM | POA: Diagnosis not present

## 2023-08-21 DIAGNOSIS — I700 Atherosclerosis of aorta: Secondary | ICD-10-CM | POA: Diagnosis not present

## 2023-08-21 DIAGNOSIS — E1122 Type 2 diabetes mellitus with diabetic chronic kidney disease: Secondary | ICD-10-CM | POA: Diagnosis not present

## 2023-08-21 DIAGNOSIS — E039 Hypothyroidism, unspecified: Secondary | ICD-10-CM | POA: Diagnosis not present

## 2023-08-21 DIAGNOSIS — K5900 Constipation, unspecified: Secondary | ICD-10-CM | POA: Diagnosis not present

## 2023-08-21 DIAGNOSIS — R296 Repeated falls: Secondary | ICD-10-CM | POA: Diagnosis not present

## 2023-08-21 DIAGNOSIS — F17210 Nicotine dependence, cigarettes, uncomplicated: Secondary | ICD-10-CM | POA: Diagnosis not present

## 2023-08-21 DIAGNOSIS — M810 Age-related osteoporosis without current pathological fracture: Secondary | ICD-10-CM | POA: Diagnosis not present

## 2023-08-21 DIAGNOSIS — I129 Hypertensive chronic kidney disease with stage 1 through stage 4 chronic kidney disease, or unspecified chronic kidney disease: Secondary | ICD-10-CM | POA: Diagnosis not present

## 2023-08-21 DIAGNOSIS — Z7982 Long term (current) use of aspirin: Secondary | ICD-10-CM | POA: Diagnosis not present

## 2023-08-21 DIAGNOSIS — K5732 Diverticulitis of large intestine without perforation or abscess without bleeding: Secondary | ICD-10-CM | POA: Diagnosis not present

## 2023-08-21 DIAGNOSIS — G319 Degenerative disease of nervous system, unspecified: Secondary | ICD-10-CM | POA: Diagnosis not present

## 2023-08-22 DIAGNOSIS — M159 Polyosteoarthritis, unspecified: Secondary | ICD-10-CM | POA: Diagnosis not present

## 2023-08-22 DIAGNOSIS — E1122 Type 2 diabetes mellitus with diabetic chronic kidney disease: Secondary | ICD-10-CM | POA: Diagnosis not present

## 2023-08-22 DIAGNOSIS — N183 Chronic kidney disease, stage 3 unspecified: Secondary | ICD-10-CM | POA: Diagnosis not present

## 2023-08-22 DIAGNOSIS — E1165 Type 2 diabetes mellitus with hyperglycemia: Secondary | ICD-10-CM | POA: Diagnosis not present

## 2023-08-22 DIAGNOSIS — N3289 Other specified disorders of bladder: Secondary | ICD-10-CM | POA: Diagnosis not present

## 2023-08-22 DIAGNOSIS — G319 Degenerative disease of nervous system, unspecified: Secondary | ICD-10-CM | POA: Diagnosis not present

## 2023-08-22 DIAGNOSIS — I129 Hypertensive chronic kidney disease with stage 1 through stage 4 chronic kidney disease, or unspecified chronic kidney disease: Secondary | ICD-10-CM | POA: Diagnosis not present

## 2023-08-22 DIAGNOSIS — N390 Urinary tract infection, site not specified: Secondary | ICD-10-CM | POA: Diagnosis not present

## 2023-08-22 DIAGNOSIS — R5381 Other malaise: Secondary | ICD-10-CM | POA: Diagnosis not present

## 2023-08-22 DIAGNOSIS — J9611 Chronic respiratory failure with hypoxia: Secondary | ICD-10-CM | POA: Diagnosis not present

## 2023-08-23 ENCOUNTER — Other Ambulatory Visit: Payer: Self-pay

## 2023-08-23 ENCOUNTER — Ambulatory Visit (INDEPENDENT_AMBULATORY_CARE_PROVIDER_SITE_OTHER): Payer: 59 | Admitting: Internal Medicine

## 2023-08-23 ENCOUNTER — Encounter: Payer: Self-pay | Admitting: Internal Medicine

## 2023-08-23 VITALS — BP 114/66 | HR 80 | Temp 97.4°F

## 2023-08-23 DIAGNOSIS — N183 Chronic kidney disease, stage 3 unspecified: Secondary | ICD-10-CM | POA: Diagnosis not present

## 2023-08-23 DIAGNOSIS — Z7983 Long term (current) use of bisphosphonates: Secondary | ICD-10-CM | POA: Diagnosis not present

## 2023-08-23 DIAGNOSIS — I700 Atherosclerosis of aorta: Secondary | ICD-10-CM | POA: Diagnosis not present

## 2023-08-23 DIAGNOSIS — A4181 Sepsis due to Enterococcus: Secondary | ICD-10-CM | POA: Diagnosis not present

## 2023-08-23 DIAGNOSIS — I129 Hypertensive chronic kidney disease with stage 1 through stage 4 chronic kidney disease, or unspecified chronic kidney disease: Secondary | ICD-10-CM | POA: Diagnosis not present

## 2023-08-23 DIAGNOSIS — E1122 Type 2 diabetes mellitus with diabetic chronic kidney disease: Secondary | ICD-10-CM | POA: Diagnosis not present

## 2023-08-23 DIAGNOSIS — Z9981 Dependence on supplemental oxygen: Secondary | ICD-10-CM | POA: Diagnosis not present

## 2023-08-23 DIAGNOSIS — K5732 Diverticulitis of large intestine without perforation or abscess without bleeding: Secondary | ICD-10-CM | POA: Diagnosis not present

## 2023-08-23 DIAGNOSIS — M810 Age-related osteoporosis without current pathological fracture: Secondary | ICD-10-CM | POA: Diagnosis not present

## 2023-08-23 DIAGNOSIS — E46 Unspecified protein-calorie malnutrition: Secondary | ICD-10-CM | POA: Diagnosis not present

## 2023-08-23 DIAGNOSIS — F17210 Nicotine dependence, cigarettes, uncomplicated: Secondary | ICD-10-CM | POA: Diagnosis not present

## 2023-08-23 DIAGNOSIS — E039 Hypothyroidism, unspecified: Secondary | ICD-10-CM | POA: Diagnosis not present

## 2023-08-23 DIAGNOSIS — K5900 Constipation, unspecified: Secondary | ICD-10-CM | POA: Diagnosis not present

## 2023-08-23 DIAGNOSIS — G894 Chronic pain syndrome: Secondary | ICD-10-CM | POA: Diagnosis not present

## 2023-08-23 DIAGNOSIS — R296 Repeated falls: Secondary | ICD-10-CM | POA: Diagnosis not present

## 2023-08-23 DIAGNOSIS — J9611 Chronic respiratory failure with hypoxia: Secondary | ICD-10-CM | POA: Diagnosis not present

## 2023-08-23 DIAGNOSIS — M159 Polyosteoarthritis, unspecified: Secondary | ICD-10-CM | POA: Diagnosis not present

## 2023-08-23 DIAGNOSIS — Z87440 Personal history of urinary (tract) infections: Secondary | ICD-10-CM | POA: Diagnosis not present

## 2023-08-23 DIAGNOSIS — N3000 Acute cystitis without hematuria: Secondary | ICD-10-CM | POA: Diagnosis not present

## 2023-08-23 DIAGNOSIS — G319 Degenerative disease of nervous system, unspecified: Secondary | ICD-10-CM | POA: Diagnosis not present

## 2023-08-23 DIAGNOSIS — Z8616 Personal history of COVID-19: Secondary | ICD-10-CM | POA: Diagnosis not present

## 2023-08-23 DIAGNOSIS — Z7982 Long term (current) use of aspirin: Secondary | ICD-10-CM | POA: Diagnosis not present

## 2023-08-23 DIAGNOSIS — Z556 Problems related to health literacy: Secondary | ICD-10-CM | POA: Diagnosis not present

## 2023-08-23 DIAGNOSIS — J449 Chronic obstructive pulmonary disease, unspecified: Secondary | ICD-10-CM | POA: Diagnosis not present

## 2023-08-23 NOTE — Progress Notes (Signed)
 Patient: Amanda Armstrong  DOB: Jan 01, 1946 MRN: [id number] PCP: Bertell Satterfield, MD      Patient Active Problem List   Diagnosis Date Noted   Septic shock (HCC) 08/08/2023   Bladder tumor 08/03/2023   AKI (acute kidney injury) (HCC) 07/04/2022   Type 2 diabetes mellitus (HCC) 07/04/2022   COPD (chronic obstructive pulmonary disease) (HCC) 07/04/2022   Chronic hypoxic respiratory failure, on home oxygen  therapy (HCC) 07/04/2022   Tobacco use 07/04/2022   Acute cystitis 07/04/2022   Acute respiratory failure with hypoxia (HCC) 07/03/2022   COPD GOLD ? / group B still smoking  05/21/2021   Chronic respiratory failure with hypoxia (HCC) 05/21/2021   Uncontrolled type 2 diabetes mellitus with hyperglycemia (HCC) 03/23/2021   Insomnia 03/23/2021   Chronic pain syndrome 03/22/2021   Recurrent falls 03/22/2021   Vertigo 03/22/2021   Multifactorial gait disorder 03/22/2021   Right foot drop 03/22/2021   Hyperosmolar hyperglycemic state (HHS) (HCC) 03/22/2021   Unspecified protein-calorie malnutrition (HCC) 08/07/2020   Altered mental status    Cigarette smoker 08/04/2020   Acute metabolic encephalopathy 08/04/2020   Acute renal failure superimposed on stage 3b chronic kidney disease (HCC) 08/04/2020   COVID-19 08/03/2020   History of falling 10/31/2018   Long term (current) use of opiate analgesic 09/05/2018   Hyperlipidemia, unspecified 07/18/2017   Age-related osteoporosis without current pathological fracture 07/18/2017   UTI (urinary tract infection) 01/29/2016   Nicotine  dependence 09/07/2015   Enteritis due to Clostridium difficile 08/29/2013   Weakness 08/02/2013   Diarrhea 08/02/2013   Acute pyelonephritis 05/28/2013   Lower urinary tract infectious disease 05/27/2013   Tachypnea 05/27/2013   Hyperglycemia 05/27/2013   Arthritis    Anxiety    Hypothyroidism      Subjective:  Amanda Armstrong is a 78 y.o. F with past medical history as below presents for  hospital follow-up of complicated UTI.  Patient had undergone TURP procedure of bladder tumor resection 5 days prior to admission, history of recurrent UTIs.  Urine cultures grew 40K colonies ofCitrobacter freundii, and 30k colonies Enterococcus raffinosus.  CT abdomen pelvis showedgas in the vagina consistent with prior study cannot exclude rectovaginal fistula. Bladder wall appears thickened. Clinical correlation to exclude cystitis..  Given sepsis picture on admission with urine cultures with recent instrumentation->decided to  treat with daptomycin  and ciprofloxacin  x 14 days for complicated UTI due to 08/23/2023. Pt tolerating antibiotics.   Review of Systems  All other systems reviewed and are negative.   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    pt stated that she has intermittent sx   Clostridium difficile infection    Daytime somnolence    Frequent falls    Headache(784.0)    SINUS HEADACHE OCCASSIONALLY   Hypertension    Hypothyroidism    Multifactorial gait disorder    Osteoporosis    Right foot drop    Snoring    Stroke (HCC)    Tobacco use    Vertigo    Weakness     Outpatient Medications Prior to Visit  Medication Sig Dispense Refill   ACCU-CHEK GUIDE test strip USE TEST STRIP TO CHECK GLUCOSE TWICE DAILY BEFORE BREAKFAST AND  BEFORE BEDTIME 200 each 2   Accu-Chek Softclix Lancets lancets USE  TO CHECK GLUCOSE TWICE DAILY 100 each  2   acetaminophen  (TYLENOL ) 500 MG tablet Take 1,000 mg by mouth every 6 (six) hours as needed for mild pain.     alendronate  (FOSAMAX ) 70 MG tablet Take 70 mg by mouth every Sunday.     ALPRAZolam  (XANAX ) 1 MG tablet Take 1 mg by mouth at bedtime.     aspirin  EC 81 MG tablet Take 1 tablet (81 mg total) by mouth daily with breakfast. 30 tablet 12   atorvastatin  (LIPITOR) 20 MG tablet Take 20 mg by mouth daily at 12  noon.     bismuth subsalicylate (PEPTO BISMOL) 262 MG/15ML suspension Take 30 mLs by mouth every 6 (six) hours as needed for indigestion.     blood glucose meter kit and supplies Dispense based on patient and insurance preference. Use up to four times daily as directed. (FOR ICD-10 E10.9, E11.9). 1 each 0   Cholecalciferol  (VITAMIN D3) 50 MCG (2000 UT) capsule Take 2,000 Units by mouth daily.     ciprofloxacin  (CIPRO ) 500 MG tablet Take 1 tablet (500 mg total) by mouth 2 (two) times daily for 12 days. 24 tablet 0   daptomycin  (CUBICIN ) IVPB Inject 500 mg into the vein daily for 12 days. Indication:  Polymicrobial urinary tract infection First Dose: Yes Last Day of Therapy:  08/23/2023 Labs - Once weekly:  CBC/D, BMP, and CPK Labs - Once weekly: ESR and CRP Method of administration: IV Push Method of administration may be changed at the discretion of home infusion pharmacist based upon assessment of the patient and/or caregiver's ability to self-administer the medication ordered. 12 Units 0   DULoxetine  (CYMBALTA ) 30 MG capsule Take 30 mg by mouth daily.     furosemide  (LASIX ) 20 MG tablet Take 20 mg by mouth daily.     HYDROcodone -acetaminophen  (NORCO) 10-325 MG tablet Take 1 tablet by mouth every 4 (four) hours as needed for moderate pain.     hydrOXYzine  (ATARAX ) 25 MG tablet Take 1 tablet (25 mg total) by mouth every 8 (eight) hours as needed for itching.     levothyroxine  (SYNTHROID ) 75 MCG tablet Take 1 tablet (75 mcg total) by mouth daily before breakfast. 90 tablet 1   Menthol -Methyl Salicylate (MUSCLE RUB EX) Apply 1 Application topically daily as needed (pain).     nicotine  (NICODERM CQ  - DOSED IN MG/24 HOURS) 14 mg/24hr patch Place 1 patch (14 mg total) onto the skin daily. 28 patch 0   OXYGEN  Inhale 1.5 L/min into the lungs daily as needed (O2 sats below 90 and at bedtime).     SANTYL 250 UNIT/GM ointment Apply 1 Application topically daily.     TRELEGY ELLIPTA 200-62.5-25 MCG/ACT AEPB  Inhale 1 puff into the lungs daily.     No facility-administered medications prior to visit.     Allergies  Allergen Reactions   Iron Other (See Comments)    upset stomach   Oxycodone Other (See Comments)    CX HER TO FELL LIKE SHE IS OUT OF HER BODY   Sudafed [Pseudoephedrine Hcl] Other (See Comments)    funny feeling   Chlorhexidine  Gluconate Rash    Social History   Tobacco Use   Smoking status: Every Day    Current packs/day: 1.00    Average packs/day: 1 pack/day for 48.0 years (48.0 ttl pk-yrs)    Types: Cigarettes   Smokeless tobacco: Never   Tobacco comments:    12/29/14 1/2 PPD  Vaping Use   Vaping status: Never Used  Substance Use Topics  Alcohol use: No    Alcohol/week: 0.0 standard drinks of alcohol   Drug use: No    Family History  Problem Relation Age of Onset   Heart attack Mother    COPD Father    Arthritis Sister    Heart disease Brother     Objective:  There were no vitals filed for this visit. There is no height or weight on file to calculate BMI.  Physical Exam Constitutional:      Appearance: Normal appearance.  HENT:     Head: Normocephalic and atraumatic.     Right Ear: Tympanic membrane normal.     Left Ear: Tympanic membrane normal.     Nose: Nose normal.     Mouth/Throat:     Mouth: Mucous membranes are moist.  Eyes:     Extraocular Movements: Extraocular movements intact.     Conjunctiva/sclera: Conjunctivae normal.     Pupils: Pupils are equal, round, and reactive to light.  Cardiovascular:     Rate and Rhythm: Normal rate and regular rhythm.     Heart sounds: No murmur heard.    No friction rub. No gallop.  Pulmonary:     Effort: Pulmonary effort is normal.     Breath sounds: Normal breath sounds.  Abdominal:     General: Abdomen is flat.     Palpations: Abdomen is soft.  Skin:    General: Skin is warm and dry.  Neurological:     General: No focal deficit present.     Mental Status: She is alert and oriented  to person, place, and time.  Psychiatric:        Mood and Affect: Mood normal.     Lab Results: Lab Results  Component Value Date   WBC 3.4 (L) 08/11/2023   HGB 9.3 (L) 08/11/2023   HCT 28.2 (L) 08/11/2023   MCV 91.6 08/11/2023   PLT 102 (L) 08/11/2023    Lab Results  Component Value Date   CREATININE 1.12 (H) 08/11/2023   BUN 11 08/11/2023   NA 137 08/11/2023   K 3.3 (L) 08/11/2023   CL 108 08/11/2023   CO2 22 08/11/2023    Lab Results  Component Value Date   ALT 23 08/07/2023   AST 23 08/07/2023   ALKPHOS 65 08/07/2023   BILITOT 1.0 08/07/2023     Assessment & Plan:  #Complicated UTI #Bowel resection 5 days prior to hospitalization - Urine cultures grew40k colonies Citrobacter freundii, and 30k colonies Enterococcus raffinosus  -CT abdomen pelvis showed gas in the vagina consistent with prior study cannot exclude rectovaginal fistula.  Bladder wall appears thickened.  Clinical correlation to exclude cystitis. - Discharged on Dapto and Cipro  x 14 days EOT 08/23/2023. No new concerns today Plan:  -Stop abx, pull picc -Pt sees urology in April  -F/U PRN  #medication monitoring #PICC -Scr 0.99, wbc 5.4, ck 37 on 1/28  Loney Stank, MD [facility]   08/23/23  2:04 PM   I have personally spent 45 minutes involved in face-to-face and non-face-to-face activities for this patient on the day of the visit. Professional time spent includes the following activities: Preparing to see the patient (review of tests), Obtaining and/or reviewing separately obtained history (admission/discharge record), Performing a medically appropriate examination and/or evaluation , Ordering medications/tests/procedures, referring and communicating with other health care professionals, Documenting clinical information in the EMR, Independently interpreting results (not separately reported), Communicating results to the  patient/family/caregiver, Counseling and  educating the patient/family/caregiver and Care coordination (not separately reported).

## 2023-08-24 ENCOUNTER — Other Ambulatory Visit: Payer: Self-pay | Admitting: Urology

## 2023-08-24 ENCOUNTER — Encounter: Payer: Self-pay | Admitting: Urology

## 2023-08-24 ENCOUNTER — Telehealth: Payer: Self-pay

## 2023-08-24 DIAGNOSIS — N3020 Other chronic cystitis without hematuria: Secondary | ICD-10-CM

## 2023-08-24 MED ORDER — DOXYCYCLINE HYCLATE 100 MG PO CAPS: 100.0000 mg | ORAL_CAPSULE | Freq: Every day | ORAL | 0 refills | Status: DC

## 2023-08-24 NOTE — Progress Notes (Signed)
 Reviewed plan as below from patient's appointment with Infectious Disease specialist yesterday (Dr. Dennise).   Plan:  -Stop abx, pull picc -Sees urology in April  -F/U PRN   As advised by Dr. Sherrilee will start patient on Doxycycline  100 mg daily x1 month for UTI prevention / to address bacterial penetration into her bladder wall.   She will be advised to follow up as planned for surveillance cystoscopy with Dr. Sherrilee on 11/15/2023 as scheduled.    Lauraine Oz, MSN, FNP-C, [facility] Nurse Practitioner [facility] [place]

## 2023-08-24 NOTE — Telephone Encounter (Signed)
 Per Dr. Zelda Hickman patient ok to have picc line removed today per Dr. Zelda Hickman.  Msg sent to [facility] with orders.  Amanda Armstrong, CMA

## 2023-08-25 ENCOUNTER — Telehealth: Payer: Self-pay

## 2023-08-25 NOTE — Telephone Encounter (Signed)
 Patient is made aware and voiced understanding.

## 2023-08-25 NOTE — Telephone Encounter (Signed)
-----   Message from Lauraine JAYSON Oz sent at 08/24/2023  5:09 PM EST ----- Please let pt / daughter know that after reviewing ID note from yesterday & consulting with Dr. Sherrilee, I have sent in prescription for Doxycycline  100 mg daily x1 month for UTI prevention. She is advised to follow up as planned for surveillance cystoscopy with Dr. Sherrilee on 11/15/2023 as scheduled.

## 2023-08-28 DIAGNOSIS — M159 Polyosteoarthritis, unspecified: Secondary | ICD-10-CM | POA: Diagnosis not present

## 2023-08-28 DIAGNOSIS — Z9981 Dependence on supplemental oxygen: Secondary | ICD-10-CM | POA: Diagnosis not present

## 2023-08-28 DIAGNOSIS — Z556 Problems related to health literacy: Secondary | ICD-10-CM | POA: Diagnosis not present

## 2023-08-28 DIAGNOSIS — I700 Atherosclerosis of aorta: Secondary | ICD-10-CM | POA: Diagnosis not present

## 2023-08-28 DIAGNOSIS — F17210 Nicotine dependence, cigarettes, uncomplicated: Secondary | ICD-10-CM | POA: Diagnosis not present

## 2023-08-28 DIAGNOSIS — K5900 Constipation, unspecified: Secondary | ICD-10-CM | POA: Diagnosis not present

## 2023-08-28 DIAGNOSIS — A4181 Sepsis due to Enterococcus: Secondary | ICD-10-CM | POA: Diagnosis not present

## 2023-08-28 DIAGNOSIS — Z87440 Personal history of urinary (tract) infections: Secondary | ICD-10-CM | POA: Diagnosis not present

## 2023-08-28 DIAGNOSIS — R296 Repeated falls: Secondary | ICD-10-CM | POA: Diagnosis not present

## 2023-08-28 DIAGNOSIS — E46 Unspecified protein-calorie malnutrition: Secondary | ICD-10-CM | POA: Diagnosis not present

## 2023-08-28 DIAGNOSIS — J9611 Chronic respiratory failure with hypoxia: Secondary | ICD-10-CM | POA: Diagnosis not present

## 2023-08-28 DIAGNOSIS — E1122 Type 2 diabetes mellitus with diabetic chronic kidney disease: Secondary | ICD-10-CM | POA: Diagnosis not present

## 2023-08-28 DIAGNOSIS — J449 Chronic obstructive pulmonary disease, unspecified: Secondary | ICD-10-CM | POA: Diagnosis not present

## 2023-08-28 DIAGNOSIS — G319 Degenerative disease of nervous system, unspecified: Secondary | ICD-10-CM | POA: Diagnosis not present

## 2023-08-28 DIAGNOSIS — Z7982 Long term (current) use of aspirin: Secondary | ICD-10-CM | POA: Diagnosis not present

## 2023-08-28 DIAGNOSIS — N183 Chronic kidney disease, stage 3 unspecified: Secondary | ICD-10-CM | POA: Diagnosis not present

## 2023-08-28 DIAGNOSIS — Z7983 Long term (current) use of bisphosphonates: Secondary | ICD-10-CM | POA: Diagnosis not present

## 2023-08-28 DIAGNOSIS — M810 Age-related osteoporosis without current pathological fracture: Secondary | ICD-10-CM | POA: Diagnosis not present

## 2023-08-28 DIAGNOSIS — G894 Chronic pain syndrome: Secondary | ICD-10-CM | POA: Diagnosis not present

## 2023-08-28 DIAGNOSIS — K5732 Diverticulitis of large intestine without perforation or abscess without bleeding: Secondary | ICD-10-CM | POA: Diagnosis not present

## 2023-08-28 DIAGNOSIS — E039 Hypothyroidism, unspecified: Secondary | ICD-10-CM | POA: Diagnosis not present

## 2023-08-28 DIAGNOSIS — I129 Hypertensive chronic kidney disease with stage 1 through stage 4 chronic kidney disease, or unspecified chronic kidney disease: Secondary | ICD-10-CM | POA: Diagnosis not present

## 2023-08-28 DIAGNOSIS — Z8616 Personal history of COVID-19: Secondary | ICD-10-CM | POA: Diagnosis not present

## 2023-08-28 NOTE — Telephone Encounter (Signed)
 Patient's daughter called, states home health has not been out to remove PICC line. Reassured her that orders were sent on 2/6. Called [facility] [phone]), no answer. Called [facility] and spoke with [employer]. She will pass orders along to home health agency. She reports they did not receive any updated orders, but reports they had orders to remove after EOT which was 2/5.  Called patient's daughter back and updated her that orders are being sent to [facility]. Asked her to please call with any additional concerns.   Liala Codispoti D Maram Bently, RN

## 2023-08-29 NOTE — Telephone Encounter (Signed)
Patient's daughter called back stating the picc line still has not been removed by nursing.  I spoke with Pam with [facility] and she is working on having the nursing team come out to remove the picc line. Patient's duaghter advised to call our office back if [facility] has not reached out to arrange picc line removal. Amanda Armstrong, CMA

## 2023-09-01 DIAGNOSIS — J449 Chronic obstructive pulmonary disease, unspecified: Secondary | ICD-10-CM | POA: Diagnosis not present

## 2023-09-01 DIAGNOSIS — J9601 Acute respiratory failure with hypoxia: Secondary | ICD-10-CM | POA: Diagnosis not present

## 2023-09-01 DIAGNOSIS — U071 COVID-19: Secondary | ICD-10-CM | POA: Diagnosis not present

## 2023-09-04 DIAGNOSIS — Z9981 Dependence on supplemental oxygen: Secondary | ICD-10-CM | POA: Diagnosis not present

## 2023-09-04 DIAGNOSIS — Z7983 Long term (current) use of bisphosphonates: Secondary | ICD-10-CM | POA: Diagnosis not present

## 2023-09-04 DIAGNOSIS — Z8616 Personal history of COVID-19: Secondary | ICD-10-CM | POA: Diagnosis not present

## 2023-09-04 DIAGNOSIS — J9611 Chronic respiratory failure with hypoxia: Secondary | ICD-10-CM | POA: Diagnosis not present

## 2023-09-04 DIAGNOSIS — K5732 Diverticulitis of large intestine without perforation or abscess without bleeding: Secondary | ICD-10-CM | POA: Diagnosis not present

## 2023-09-04 DIAGNOSIS — M810 Age-related osteoporosis without current pathological fracture: Secondary | ICD-10-CM | POA: Diagnosis not present

## 2023-09-04 DIAGNOSIS — F17210 Nicotine dependence, cigarettes, uncomplicated: Secondary | ICD-10-CM | POA: Diagnosis not present

## 2023-09-04 DIAGNOSIS — E1122 Type 2 diabetes mellitus with diabetic chronic kidney disease: Secondary | ICD-10-CM | POA: Diagnosis not present

## 2023-09-04 DIAGNOSIS — E46 Unspecified protein-calorie malnutrition: Secondary | ICD-10-CM | POA: Diagnosis not present

## 2023-09-04 DIAGNOSIS — Z87440 Personal history of urinary (tract) infections: Secondary | ICD-10-CM | POA: Diagnosis not present

## 2023-09-04 DIAGNOSIS — N183 Chronic kidney disease, stage 3 unspecified: Secondary | ICD-10-CM | POA: Diagnosis not present

## 2023-09-04 DIAGNOSIS — Z7982 Long term (current) use of aspirin: Secondary | ICD-10-CM | POA: Diagnosis not present

## 2023-09-04 DIAGNOSIS — M159 Polyosteoarthritis, unspecified: Secondary | ICD-10-CM | POA: Diagnosis not present

## 2023-09-04 DIAGNOSIS — R296 Repeated falls: Secondary | ICD-10-CM | POA: Diagnosis not present

## 2023-09-04 DIAGNOSIS — G894 Chronic pain syndrome: Secondary | ICD-10-CM | POA: Diagnosis not present

## 2023-09-04 DIAGNOSIS — Z556 Problems related to health literacy: Secondary | ICD-10-CM | POA: Diagnosis not present

## 2023-09-04 DIAGNOSIS — J449 Chronic obstructive pulmonary disease, unspecified: Secondary | ICD-10-CM | POA: Diagnosis not present

## 2023-09-04 DIAGNOSIS — E039 Hypothyroidism, unspecified: Secondary | ICD-10-CM | POA: Diagnosis not present

## 2023-09-04 DIAGNOSIS — I129 Hypertensive chronic kidney disease with stage 1 through stage 4 chronic kidney disease, or unspecified chronic kidney disease: Secondary | ICD-10-CM | POA: Diagnosis not present

## 2023-09-04 DIAGNOSIS — G319 Degenerative disease of nervous system, unspecified: Secondary | ICD-10-CM | POA: Diagnosis not present

## 2023-09-04 DIAGNOSIS — I700 Atherosclerosis of aorta: Secondary | ICD-10-CM | POA: Diagnosis not present

## 2023-09-04 DIAGNOSIS — K5900 Constipation, unspecified: Secondary | ICD-10-CM | POA: Diagnosis not present

## 2023-09-06 ENCOUNTER — Emergency Department (HOSPITAL_COMMUNITY): Payer: 59

## 2023-09-06 ENCOUNTER — Other Ambulatory Visit: Payer: Self-pay

## 2023-09-06 ENCOUNTER — Encounter (HOSPITAL_COMMUNITY): Payer: Self-pay | Admitting: *Deleted

## 2023-09-06 ENCOUNTER — Inpatient Hospital Stay (HOSPITAL_COMMUNITY)
Admission: EM | Admit: 2023-09-06 | Discharge: 2023-09-09 | DRG: 758 | Disposition: A | Discharge status: Skilled Nursing Facility | Payer: 59 | Attending: Internal Medicine | Admitting: Internal Medicine

## 2023-09-06 DIAGNOSIS — W19XXXA Unspecified fall, initial encounter: Secondary | ICD-10-CM

## 2023-09-06 DIAGNOSIS — J9611 Chronic respiratory failure with hypoxia: Secondary | ICD-10-CM | POA: Diagnosis present

## 2023-09-06 DIAGNOSIS — R1312 Dysphagia, oropharyngeal phase: Secondary | ICD-10-CM | POA: Diagnosis not present

## 2023-09-06 DIAGNOSIS — G9389 Other specified disorders of brain: Secondary | ICD-10-CM | POA: Diagnosis present

## 2023-09-06 DIAGNOSIS — R531 Weakness: Principal | ICD-10-CM

## 2023-09-06 DIAGNOSIS — N3000 Acute cystitis without hematuria: Secondary | ICD-10-CM | POA: Diagnosis present

## 2023-09-06 DIAGNOSIS — Z7401 Bed confinement status: Secondary | ICD-10-CM | POA: Diagnosis not present

## 2023-09-06 DIAGNOSIS — N1832 Chronic kidney disease, stage 3b: Secondary | ICD-10-CM | POA: Diagnosis present

## 2023-09-06 DIAGNOSIS — Z7983 Long term (current) use of bisphosphonates: Secondary | ICD-10-CM | POA: Diagnosis not present

## 2023-09-06 DIAGNOSIS — E1122 Type 2 diabetes mellitus with diabetic chronic kidney disease: Secondary | ICD-10-CM | POA: Diagnosis present

## 2023-09-06 DIAGNOSIS — Z87891 Personal history of nicotine dependence: Secondary | ICD-10-CM

## 2023-09-06 DIAGNOSIS — Z7951 Long term (current) use of inhaled steroids: Secondary | ICD-10-CM

## 2023-09-06 DIAGNOSIS — N3001 Acute cystitis with hematuria: Secondary | ICD-10-CM | POA: Diagnosis not present

## 2023-09-06 DIAGNOSIS — Z720 Tobacco use: Secondary | ICD-10-CM | POA: Diagnosis not present

## 2023-09-06 DIAGNOSIS — E66811 Obesity, class 1: Secondary | ICD-10-CM | POA: Diagnosis present

## 2023-09-06 DIAGNOSIS — M1611 Unilateral primary osteoarthritis, right hip: Secondary | ICD-10-CM | POA: Diagnosis not present

## 2023-09-06 DIAGNOSIS — Z9981 Dependence on supplemental oxygen: Secondary | ICD-10-CM | POA: Diagnosis not present

## 2023-09-06 DIAGNOSIS — M25561 Pain in right knee: Secondary | ICD-10-CM | POA: Diagnosis not present

## 2023-09-06 DIAGNOSIS — Z8673 Personal history of transient ischemic attack (TIA), and cerebral infarction without residual deficits: Secondary | ICD-10-CM | POA: Diagnosis not present

## 2023-09-06 DIAGNOSIS — E119 Type 2 diabetes mellitus without complications: Secondary | ICD-10-CM | POA: Diagnosis not present

## 2023-09-06 DIAGNOSIS — M6281 Muscle weakness (generalized): Secondary | ICD-10-CM | POA: Diagnosis not present

## 2023-09-06 DIAGNOSIS — Z7982 Long term (current) use of aspirin: Secondary | ICD-10-CM

## 2023-09-06 DIAGNOSIS — F0390 Unspecified dementia without behavioral disturbance: Secondary | ICD-10-CM | POA: Diagnosis present

## 2023-09-06 DIAGNOSIS — Z885 Allergy status to narcotic agent status: Secondary | ICD-10-CM | POA: Diagnosis not present

## 2023-09-06 DIAGNOSIS — Z87440 Personal history of urinary (tract) infections: Secondary | ICD-10-CM

## 2023-09-06 DIAGNOSIS — Z96651 Presence of right artificial knee joint: Secondary | ICD-10-CM | POA: Diagnosis present

## 2023-09-06 DIAGNOSIS — Z6832 Body mass index (BMI) 32.0-32.9, adult: Secondary | ICD-10-CM | POA: Diagnosis not present

## 2023-09-06 DIAGNOSIS — Z471 Aftercare following joint replacement surgery: Secondary | ICD-10-CM | POA: Diagnosis not present

## 2023-09-06 DIAGNOSIS — D494 Neoplasm of unspecified behavior of bladder: Secondary | ICD-10-CM | POA: Diagnosis not present

## 2023-09-06 DIAGNOSIS — I6782 Cerebral ischemia: Secondary | ICD-10-CM | POA: Diagnosis not present

## 2023-09-06 DIAGNOSIS — M810 Age-related osteoporosis without current pathological fracture: Secondary | ICD-10-CM | POA: Diagnosis present

## 2023-09-06 DIAGNOSIS — Y92009 Unspecified place in unspecified non-institutional (private) residence as the place of occurrence of the external cause: Secondary | ICD-10-CM

## 2023-09-06 DIAGNOSIS — Z825 Family history of asthma and other chronic lower respiratory diseases: Secondary | ICD-10-CM

## 2023-09-06 DIAGNOSIS — E039 Hypothyroidism, unspecified: Secondary | ICD-10-CM | POA: Diagnosis present

## 2023-09-06 DIAGNOSIS — E860 Dehydration: Secondary | ICD-10-CM | POA: Diagnosis present

## 2023-09-06 DIAGNOSIS — J449 Chronic obstructive pulmonary disease, unspecified: Secondary | ICD-10-CM | POA: Diagnosis present

## 2023-09-06 DIAGNOSIS — Z79890 Hormone replacement therapy (postmenopausal): Secondary | ICD-10-CM | POA: Diagnosis not present

## 2023-09-06 DIAGNOSIS — Z8249 Family history of ischemic heart disease and other diseases of the circulatory system: Secondary | ICD-10-CM | POA: Diagnosis not present

## 2023-09-06 DIAGNOSIS — I129 Hypertensive chronic kidney disease with stage 1 through stage 4 chronic kidney disease, or unspecified chronic kidney disease: Secondary | ICD-10-CM | POA: Diagnosis present

## 2023-09-06 DIAGNOSIS — R296 Repeated falls: Secondary | ICD-10-CM | POA: Diagnosis present

## 2023-09-06 DIAGNOSIS — Z888 Allergy status to other drugs, medicaments and biological substances status: Secondary | ICD-10-CM | POA: Diagnosis not present

## 2023-09-06 DIAGNOSIS — M25562 Pain in left knee: Secondary | ICD-10-CM | POA: Diagnosis not present

## 2023-09-06 DIAGNOSIS — S0990XA Unspecified injury of head, initial encounter: Secondary | ICD-10-CM | POA: Diagnosis not present

## 2023-09-06 DIAGNOSIS — R6889 Other general symptoms and signs: Secondary | ICD-10-CM | POA: Diagnosis not present

## 2023-09-06 DIAGNOSIS — R278 Other lack of coordination: Secondary | ICD-10-CM | POA: Diagnosis not present

## 2023-09-06 DIAGNOSIS — M1990 Unspecified osteoarthritis, unspecified site: Secondary | ICD-10-CM | POA: Diagnosis not present

## 2023-09-06 DIAGNOSIS — B3741 Candidal cystitis and urethritis: Secondary | ICD-10-CM | POA: Diagnosis present

## 2023-09-06 DIAGNOSIS — Z96652 Presence of left artificial knee joint: Secondary | ICD-10-CM | POA: Diagnosis not present

## 2023-09-06 DIAGNOSIS — Z743 Need for continuous supervision: Secondary | ICD-10-CM | POA: Diagnosis not present

## 2023-09-06 DIAGNOSIS — J99 Respiratory disorders in diseases classified elsewhere: Secondary | ICD-10-CM | POA: Diagnosis not present

## 2023-09-06 DIAGNOSIS — R2689 Other abnormalities of gait and mobility: Secondary | ICD-10-CM | POA: Diagnosis not present

## 2023-09-06 DIAGNOSIS — Z8261 Family history of arthritis: Secondary | ICD-10-CM

## 2023-09-06 DIAGNOSIS — S12030A Displaced posterior arch fracture of first cervical vertebra, initial encounter for closed fracture: Secondary | ICD-10-CM | POA: Diagnosis not present

## 2023-09-06 DIAGNOSIS — Z043 Encounter for examination and observation following other accident: Secondary | ICD-10-CM | POA: Diagnosis not present

## 2023-09-06 DIAGNOSIS — Z79899 Other long term (current) drug therapy: Secondary | ICD-10-CM

## 2023-09-06 DIAGNOSIS — F802 Mixed receptive-expressive language disorder: Secondary | ICD-10-CM | POA: Diagnosis not present

## 2023-09-06 DIAGNOSIS — F419 Anxiety disorder, unspecified: Secondary | ICD-10-CM | POA: Diagnosis present

## 2023-09-06 LAB — CBG MONITORING, ED: Glucose-Capillary: 141 mg/dL — ABNORMAL HIGH (ref 70–99)

## 2023-09-06 LAB — CBC WITH DIFFERENTIAL/PLATELET
Abs Immature Granulocytes: 0.04 10*3/uL (ref 0.00–0.07)
Basophils Absolute: 0.1 10*3/uL (ref 0.0–0.1)
Basophils Relative: 1 %
Eosinophils Absolute: 0.2 10*3/uL (ref 0.0–0.5)
Eosinophils Relative: 3 %
HCT: 40 % (ref 36.0–46.0)
Hemoglobin: 12.5 g/dL (ref 12.0–15.0)
Immature Granulocytes: 1 %
Lymphocytes Relative: 18 %
Lymphs Abs: 1.5 10*3/uL (ref 0.7–4.0)
MCH: 29.8 pg (ref 26.0–34.0)
MCHC: 31.3 g/dL (ref 30.0–36.0)
MCV: 95.2 fL (ref 80.0–100.0)
Monocytes Absolute: 0.6 10*3/uL (ref 0.1–1.0)
Monocytes Relative: 7 %
Neutro Abs: 5.8 10*3/uL (ref 1.7–7.7)
Neutrophils Relative %: 70 %
Platelets: 230 10*3/uL (ref 150–400)
RBC: 4.2 MIL/uL (ref 3.87–5.11)
RDW: 16.2 % — ABNORMAL HIGH (ref 11.5–15.5)
WBC: 8.2 10*3/uL (ref 4.0–10.5)
nRBC: 0 % (ref 0.0–0.2)

## 2023-09-06 LAB — URINALYSIS, ROUTINE W REFLEX MICROSCOPIC
Bilirubin Urine: NEGATIVE
Glucose, UA: NEGATIVE mg/dL
Ketones, ur: NEGATIVE mg/dL
Nitrite: NEGATIVE
Protein, ur: 30 mg/dL — AB
RBC / HPF: >50 RBC/hpf (ref 0–5)
Specific Gravity, Urine: 1.01 (ref 1.005–1.030)
WBC, UA: >50 WBC/hpf (ref 0–5)
pH: 5 (ref 5.0–8.0)

## 2023-09-06 LAB — BASIC METABOLIC PANEL
Anion gap: 9 (ref 5–15)
BUN: 24 mg/dL — ABNORMAL HIGH (ref 8–23)
CO2: 29 mmol/L (ref 22–32)
Calcium: 9.1 mg/dL (ref 8.9–10.3)
Chloride: 102 mmol/L (ref 98–111)
Creatinine, Ser: 1.28 mg/dL — ABNORMAL HIGH (ref 0.44–1.00)
GFR, Estimated: 43 mL/min — ABNORMAL LOW (ref >60)
Glucose, Bld: 156 mg/dL — ABNORMAL HIGH (ref 70–99)
Potassium: 4 mmol/L (ref 3.5–5.1)
Sodium: 140 mmol/L (ref 135–145)

## 2023-09-06 MED ORDER — ACETAMINOPHEN 325 MG PO TABS: 650.0000 mg | ORAL_TABLET | Freq: Four times a day (QID) | ORAL | Status: DC | PRN

## 2023-09-06 MED ORDER — UMECLIDINIUM BROMIDE 62.5 MCG/ACT IN AEPB
1.0000 | INHALATION_SPRAY | Freq: Every day | RESPIRATORY_TRACT | Status: DC
  Administered 2023-09-07 – 2023-09-09 (×3): 1 via RESPIRATORY_TRACT
  Filled 2023-09-06: qty 7

## 2023-09-06 MED ORDER — HYDROCODONE-ACETAMINOPHEN 10-325 MG PO TABS
1.0000 | ORAL_TABLET | Freq: Four times a day (QID) | ORAL | Status: DC | PRN
  Administered 2023-09-09 (×2): 1 via ORAL
  Filled 2023-09-06 (×2): qty 1

## 2023-09-06 MED ORDER — NICOTINE 14 MG/24HR TD PT24
14.0000 mg | MEDICATED_PATCH | Freq: Once | TRANSDERMAL | Status: AC
  Administered 2023-09-06: 14 mg via TRANSDERMAL
  Filled 2023-09-06: qty 1

## 2023-09-06 MED ORDER — CIPROFLOXACIN IN D5W 400 MG/200ML IV SOLN
400.0000 mg | Freq: Two times a day (BID) | INTRAVENOUS | Status: DC
  Administered 2023-09-07 – 2023-09-08 (×3): 400 mg via INTRAVENOUS
  Filled 2023-09-06 (×3): qty 200

## 2023-09-06 MED ORDER — ACETAMINOPHEN 650 MG RE SUPP: 650.0000 mg | Freq: Four times a day (QID) | RECTAL | Status: DC | PRN

## 2023-09-06 MED ORDER — POLYETHYLENE GLYCOL 3350 17 G PO PACK: 17.0000 g | PACK | Freq: Every day | ORAL | Status: DC | PRN

## 2023-09-06 MED ORDER — ALPRAZOLAM 1 MG PO TABS
1.0000 mg | ORAL_TABLET | Freq: Every day | ORAL | Status: DC
  Administered 2023-09-06 – 2023-09-08 (×3): 1 mg via ORAL
  Filled 2023-09-06: qty 2
  Filled 2023-09-06 (×2): qty 1

## 2023-09-06 MED ORDER — LEVOTHYROXINE SODIUM 75 MCG PO TABS
75.0000 ug | ORAL_TABLET | Freq: Every day | ORAL | Status: DC
  Administered 2023-09-07 – 2023-09-09 (×3): 75 ug via ORAL
  Filled 2023-09-06: qty 1
  Filled 2023-09-06: qty 2
  Filled 2023-09-06: qty 1

## 2023-09-06 MED ORDER — ONDANSETRON HCL 4 MG PO TABS: 4.0000 mg | ORAL_TABLET | Freq: Four times a day (QID) | ORAL | Status: DC | PRN

## 2023-09-06 MED ORDER — CIPROFLOXACIN IN D5W 400 MG/200ML IV SOLN
400.0000 mg | Freq: Once | INTRAVENOUS | Status: AC
Start: 2023-09-06 — End: 2023-09-06
  Administered 2023-09-06: 400 mg via INTRAVENOUS
  Filled 2023-09-06: qty 200

## 2023-09-06 MED ORDER — SODIUM CHLORIDE 0.9 % IV BOLUS
500.0000 mL | Freq: Once | INTRAVENOUS | Status: AC
Start: 2023-09-06 — End: 2023-09-06
  Administered 2023-09-06: 500 mL via INTRAVENOUS

## 2023-09-06 MED ORDER — DONEPEZIL HCL 5 MG PO TABS
5.0000 mg | ORAL_TABLET | Freq: Every day | ORAL | Status: DC
  Administered 2023-09-07 – 2023-09-09 (×3): 5 mg via ORAL
  Filled 2023-09-06 (×3): qty 1

## 2023-09-06 MED ORDER — ENOXAPARIN SODIUM 40 MG/0.4ML IJ SOSY
40.0000 mg | PREFILLED_SYRINGE | INTRAMUSCULAR | Status: DC
  Administered 2023-09-06 – 2023-09-08 (×3): 40 mg via SUBCUTANEOUS
  Filled 2023-09-06 (×3): qty 0.4

## 2023-09-06 MED ORDER — FLUTICASONE FUROATE-VILANTEROL 200-25 MCG/ACT IN AEPB
1.0000 | INHALATION_SPRAY | Freq: Every day | RESPIRATORY_TRACT | Status: DC
  Administered 2023-09-07 – 2023-09-09 (×3): 1 via RESPIRATORY_TRACT
  Filled 2023-09-06: qty 28

## 2023-09-06 MED ORDER — VANCOMYCIN HCL 1250 MG/250ML IV SOLN
1250.0000 mg | Freq: Once | INTRAVENOUS | Status: AC
  Administered 2023-09-06: 1250 mg via INTRAVENOUS
  Filled 2023-09-06: qty 250

## 2023-09-06 MED ORDER — ASPIRIN 81 MG PO TBEC
81.0000 mg | DELAYED_RELEASE_TABLET | Freq: Every day | ORAL | Status: DC
  Administered 2023-09-07 – 2023-09-09 (×3): 81 mg via ORAL
  Filled 2023-09-06 (×3): qty 1

## 2023-09-06 MED ORDER — VANCOMYCIN HCL 750 MG/150ML IV SOLN
750.0000 mg | INTRAVENOUS | Status: DC
  Administered 2023-09-07 – 2023-09-08 (×2): 750 mg via INTRAVENOUS
  Filled 2023-09-06 (×3): qty 150

## 2023-09-06 MED ORDER — LEVOTHYROXINE SODIUM 50 MCG PO TABS: 75.0000 ug | ORAL_TABLET | Freq: Every day | ORAL | Status: DC

## 2023-09-06 MED ORDER — ONDANSETRON HCL 4 MG/2ML IJ SOLN: 4.0000 mg | Freq: Four times a day (QID) | INTRAMUSCULAR | Status: DC | PRN

## 2023-09-06 MED ORDER — DULOXETINE HCL 30 MG PO CPEP
30.0000 mg | ORAL_CAPSULE | Freq: Every day | ORAL | Status: DC
  Administered 2023-09-06 – 2023-09-09 (×4): 30 mg via ORAL
  Filled 2023-09-06 (×4): qty 1

## 2023-09-06 MED ORDER — SODIUM CHLORIDE 0.9 % IV SOLN
1.0000 g | Freq: Once | INTRAVENOUS | Status: AC
  Administered 2023-09-06: 1 g via INTRAVENOUS
  Filled 2023-09-06: qty 10

## 2023-09-06 MED ORDER — FUROSEMIDE 20 MG PO TABS
20.0000 mg | ORAL_TABLET | Freq: Every day | ORAL | Status: DC
  Administered 2023-09-07 – 2023-09-09 (×3): 20 mg via ORAL
  Filled 2023-09-06 (×4): qty 1

## 2023-09-06 NOTE — H&P (Signed)
 History and Physical    [name] HQI:[id number] DOB: 10-02-1945 DOA: 09/06/2023  PCP: [name], MD   Patient coming from: Home  I have personally briefly reviewed patient's old medical records in [facility]  Chief Complaint: Weakness, falls  HPI: Amanda Armstrong is a 78 y.o. female with medical history significant for chronic respiratory failure, COPD, CKD 3B, diabetes mellitus, hypertension, stroke. Patient presented to the ED with complaints of generalized weakness over the past week.  She also reports urinary frequency over the past week, without dysuria.  At baseline she has poor oral intake and this is unchanged.  No vomiting no diarrhea.  She presented today with reports of a fall, with patient tripping and falling on her knees despite the assistance of her home aide.  She denies dizziness. Family also requesting rehab placement due to patient's increasing weakness.  Patient was recently hospitalized 1/20 to 1/24 for septic shock, requiring Levophed, with urine cultures growing Citrobacter and Enterococcus.  She was discharged on ciprofloxacin and daptomycin.   SNF placement was offered, but family and patient refused.  Home health PT and RN was arranged for her on discharge. Patient and family now open to the idea of rehab.  ED Course: Tmax 98.2.  Heart rate 70s.  Respiratory 16-25.  Blood pressure systolic 120s to 132G.  O2 sat greater than 98% on room air.  WBC 8.2.  UA suggestive of UTI with large leukocytes and many bacteria.   Imaging included - head and cervical CT, -unchanged ventriculomegaly, which can be seen with normal pressure hydrocephalus in the appropriate clinical setting.   Chest x-ray clear. Bilateral knee x-ray unremarkable.  Pelvic x-ray -without fractures. Patient was given ciprofloxacin and ceftriaxone.  500 mL bolus given.   Review of Systems: As per HPI all other systems reviewed and negative.  Past Medical History:  Diagnosis Date    Anxiety    Arthritis    Bilateral Knee DJD   Chronic pain    Chronically on opiate therapy    Clostridium difficile carrier    pt stated that she has intermittent sx   Clostridium difficile infection    Daytime somnolence    Frequent falls    Headache(784.0)    SINUS HEADACHE OCCASSIONALLY   Hypertension    Hypothyroidism    Multifactorial gait disorder    Osteoporosis    Right foot drop    Snoring    Stroke (HCC)    Tobacco use    Vertigo    Weakness     Past Surgical History:  Procedure Laterality Date   CATARACT EXTRACTION W/PHACO Right 06/28/2016   Procedure: CATARACT EXTRACTION PHACO AND INTRAOCULAR LENS PLACEMENT (IOC);  Surgeon: Jethro Bolus, MD;  Location: [facility];  Service: Ophthalmology;  Laterality: Right;  CDE: 11.10   CATARACT EXTRACTION W/PHACO Left 07/26/2016   Procedure: CATARACT EXTRACTION PHACO AND INTRAOCULAR LENS PLACEMENT (IOC);  Surgeon: Jethro Bolus, MD;  Location: [facility];  Service: Ophthalmology;  Laterality: Left;  CDE: 8.88   CYSTOSCOPY N/A 08/03/2023   Procedure: CYSTOSCOPY;  Surgeon: Malen Gauze, MD;  Location: [facility];  Service: Urology;  Laterality: N/A;   JOINT REPLACEMENT  11/08/3010   right total knee   TOTAL KNEE ARTHROPLASTY  11/07/2011   Procedure: TOTAL KNEE ARTHROPLASTY;  Surgeon: Nilda Simmer, MD;  Location: [facility];  Service: Orthopedics;  Laterality: Left;  DR Thurston Hole WANTS 90 MINUTES FOR THIS CASE   TOTAL KNEE ARTHROPLASTY Right 2011  TRANSURETHRAL RESECTION OF BLADDER TUMOR N/A 08/03/2023   Procedure: TRANSURETHRAL RESECTION OF BLADDER TUMOR  (TURBT) WITH GEMCITABINE;  Surgeon: Malen Gauze, MD;  Location: [facility];  Service: Urology;  Laterality: N/A;   TUBAL LIGATION  1982     reports that she has been smoking cigarettes. She has a 48 pack-year smoking history. She has never used smokeless tobacco. She reports that she does not drink alcohol and does not use drugs.  Allergies  Allergen Reactions   Iron Other (See  Comments)    "upset stomach"   Oxycodone Other (See Comments)    "CX HER TO FELL LIKE SHE IS OUT OF HER BODY"   Sudafed [Pseudoephedrine Hcl] Other (See Comments)    "funny feeling"   Chlorhexidine Gluconate Rash    Family History  Problem Relation Age of Onset   Heart attack Mother    COPD Father    Arthritis Sister    Heart disease Brother     Prior to Admission medications   Medication Sig Start Date End Date Taking? Authorizing Provider  fluconazole (DIFLUCAN) 100 MG tablet Take 100 mg by mouth daily. 09/04/23  Yes [provider]  ACCU-CHEK GUIDE test strip USE TEST STRIP TO CHECK GLUCOSE TWICE DAILY BEFORE BREAKFAST AND  BEFORE BEDTIME 02/27/23   Dani Gobble, NP  Accu-Chek Softclix Lancets lancets USE  TO CHECK GLUCOSE TWICE DAILY 04/18/23   Dani Gobble, NP  acetaminophen (TYLENOL) 500 MG tablet Take 1,000 mg by mouth every 6 (six) hours as needed for mild pain.    [provider]  alendronate (FOSAMAX) 70 MG tablet Take 70 mg by mouth every Sunday. 03/18/18   [provider]  ALPRAZolam Prudy Feeler) 1 MG tablet Take 1 mg by mouth at bedtime. 04/22/18   [provider]  aspirin EC 81 MG tablet Take 1 tablet (81 mg total) by mouth daily with breakfast. 08/11/23   Mariea Clonts, Courage, MD  atorvastatin (LIPITOR) 20 MG tablet Take 20 mg by mouth daily at 12 noon. 02/11/18   [provider]  bismuth subsalicylate (PEPTO BISMOL) 262 MG/15ML suspension Take 30 mLs by mouth every 6 (six) hours as needed for indigestion.    [provider]  blood glucose meter kit and supplies Dispense based on patient and insurance preference. Use up to four times daily as directed. (FOR ICD-10 E10.9, E11.9). 03/24/21   Amin, Ankit C, MD  Cholecalciferol (VITAMIN D3) 50 MCG (2000 UT) capsule Take 2,000 Units by mouth daily.    [provider]  DAPTOmycin (CUBICIN) 500 MG injection Inject into the vein. 08/17/23   [provider]   donepezil (ARICEPT) 5 MG tablet Take 5 mg by mouth daily. 08/18/23   [provider]  doxycycline (VIBRAMYCIN) 100 MG capsule Take 1 capsule (100 mg total) by mouth daily. 08/24/23   Donnita Falls, FNP  DULoxetine (CYMBALTA) 30 MG capsule Take 30 mg by mouth daily. 06/20/23   [provider]  furosemide (LASIX) 20 MG tablet Take 20 mg by mouth daily.    [provider]  HYDROcodone-acetaminophen (NORCO) 10-325 MG tablet Take 1 tablet by mouth every 4 (four) hours as needed for moderate pain. 12/02/20   [provider]  hydrOXYzine (ATARAX) 25 MG tablet Take 1 tablet (25 mg total) by mouth every 8 (eight) hours as needed for itching. 08/11/23   Shon Hale, MD  levothyroxine (SYNTHROID) 75 MCG tablet Take 1 tablet (75 mcg total) by mouth daily before breakfast.  02/27/23   Dani Gobble, NP  Menthol-Methyl Salicylate (MUSCLE RUB EX) Apply 1 Application topically daily as needed (pain).    [provider]  nicotine (NICODERM CQ - DOSED IN MG/24 HOURS) 14 mg/24hr patch Place 1 patch (14 mg total) onto the skin daily. 08/12/23   Shon Hale, MD  OXYGEN Inhale 1.5 L/min into the lungs daily as needed (O2 sats below 90 and at bedtime).    [provider]  SANTYL 250 UNIT/GM ointment Apply 1 Application topically daily. 05/22/23   [provider]  Dwyane Luo 200-62.5-25 MCG/ACT AEPB Inhale 1 puff into the lungs daily. 04/18/23   [provider]    Physical Exam: Vitals:   09/06/23 1356 09/06/23 1357 09/06/23 1358 09/06/23 1700  BP: 126/66 126/66  134/77  Pulse: 74 75  75  Resp: (!) 25 16  (!) 24  Temp: 97.6 F (36.4 C) 97.6 F (36.4 C)    TempSrc: Oral Oral    SpO2: 98% 99%  100%  Weight:   70.5 kg   Height:   4\' 10"  (1.473 m)     Constitutional: NAD, calm, comfortable Vitals:   09/06/23 1356 09/06/23 1357 09/06/23 1358 09/06/23 1700  BP: 126/66 126/66  134/77  Pulse: 74 75  75  Resp: (!) 25 16  (!) 24   Temp: 97.6 F (36.4 C) 97.6 F (36.4 C)    TempSrc: Oral Oral    SpO2: 98% 99%  100%  Weight:   70.5 kg   Height:   4\' 10"  (1.473 m)    Eyes: PERRL, lids and conjunctivae normal ENMT: Mucous membranes are moist.   Neck: normal, supple, no masses, no thyromegaly Respiratory: clear to auscultation bilaterally, no wheezing, no crackles. Normal respiratory effort. No accessory muscle use.  Cardiovascular: Regular rate and rhythm, no murmurs / rubs / gallops.  Extremities warm. Abdomen: no tenderness, no masses palpated. No hepatosplenomegaly. Bowel sounds positive.  Musculoskeletal: no clubbing / cyanosis. No joint deformity upper and lower extremities Skin: no rashes, lesions, ulcers. No induration Neurologic: 4+ out of 5 strength in all extremities, speech fluent, no facial asymmetry.  Psychiatric: Normal judgment and insight. Alert and oriented x 3. Normal mood.   Labs on Admission: I have personally reviewed following labs and imaging studies  CBC: Recent Labs  Lab 09/06/23 1427  WBC 8.2  NEUTROABS 5.8  HGB 12.5  HCT 40.0  MCV 95.2  PLT 230   Basic Metabolic Panel: Recent Labs  Lab 09/06/23 1427  NA 140  K 4.0  CL 102  CO2 29  GLUCOSE 156*  BUN 24*  CREATININE 1.28*  CALCIUM 9.1   CBG: Recent Labs  Lab 09/06/23 1556  GLUCAP 141*   Urine analysis:    Component Value Date/Time   COLORURINE AMBER (A) 09/06/2023 1519   APPEARANCEUR TURBID (A) 09/06/2023 1519   LABSPEC 1.010 09/06/2023 1519   PHURINE 5.0 09/06/2023 1519   GLUCOSEU NEGATIVE 09/06/2023 1519   HGBUR LARGE (A) 09/06/2023 1519   BILIRUBINUR NEGATIVE 09/06/2023 1519   KETONESUR NEGATIVE 09/06/2023 1519   PROTEINUR 30 (A) 09/06/2023 1519   UROBILINOGEN 0.2 08/27/2013 1130   NITRITE NEGATIVE 09/06/2023 1519   LEUKOCYTESUR LARGE (A) 09/06/2023 1519    Radiological Exams on Admission: DG Hips Bilat W or Wo Pelvis 3-4 Views Result Date: 09/06/2023 CLINICAL DATA:  Fall. EXAM: DG HIP (WITH OR  WITHOUT PELVIS) 3-4V BILAT COMPARISON:  July 15, 2019. FINDINGS: There is no evidence of hip fracture  or dislocation. Moderate osteophyte formation is noted involving the right hip. No significant joint space narrowing is noted. IMPRESSION: Moderate degenerative change of right hip. No acute abnormality seen. Electronically Signed   By: Lupita Raider M.D.   On: 09/06/2023 15:56   DG Knee Complete 4 Views Left Result Date: 09/06/2023 CLINICAL DATA:  Left knee pain after fall. EXAM: LEFT KNEE - COMPLETE 4+ VIEW COMPARISON:  April 03, 2016. FINDINGS: Status post left total knee arthroplasty. Femoral and tibial components are well situated. No fracture or dislocation is noted. IMPRESSION: No acute abnormality seen. Electronically Signed   By: Lupita Raider M.D.   On: 09/06/2023 15:52   DG Knee Complete 4 Views Right Result Date: 09/06/2023 CLINICAL DATA:  Right knee pain after fall. EXAM: RIGHT KNEE - COMPLETE 4+ VIEW COMPARISON:  April 03, 2016. FINDINGS: Status post right total knee arthroplasty. Femoral and tibial components are well situated. No fracture or dislocation. IMPRESSION: No acute abnormality seen. Electronically Signed   By: Lupita Raider M.D.   On: 09/06/2023 15:50   DG Chest Portable 1 View Result Date: 09/06/2023 CLINICAL DATA:  Weakness after fall. EXAM: PORTABLE CHEST 1 VIEW COMPARISON:  August 07, 2023. FINDINGS: The heart size and mediastinal contours are within normal limits. Both lungs are clear. The visualized skeletal structures are unremarkable. IMPRESSION: No active disease. Electronically Signed   By: Lupita Raider M.D.   On: 09/06/2023 15:48   CT HEAD WO CONTRAST Result Date: 09/06/2023 CLINICAL DATA:  Head trauma, minor (Age >= 65y); Neck trauma (Age >= 65y). Fall. EXAM: CT HEAD WITHOUT CONTRAST CT CERVICAL SPINE WITHOUT CONTRAST TECHNIQUE: Multidetector CT imaging of the head and cervical spine was performed following the standard protocol without  intravenous contrast. Multiplanar CT image reconstructions of the cervical spine were also generated. RADIATION DOSE REDUCTION: This exam was performed according to the departmental dose-optimization program which includes automated exposure control, adjustment of the mA and/or kV according to patient size and/or use of iterative reconstruction technique. COMPARISON:  CT head 05/06/2023.  CT cervical spine 04/30/2023. FINDINGS: CT HEAD FINDINGS Brain: There is no evidence of an acute infarct, intracranial hemorrhage, mass, midline shift, or extra-axial fluid collection. Patchy hypodensities in the cerebral white matter are unchanged and nonspecific but compatible with moderate chronic small vessel ischemic disease. A chronic lacunar infarct is again noted in the right caudate head. There is cerebral atrophy with unchanged ventriculomegaly which is again noted to be disproportionate to the size of the sulci with an acute callosal angle. Vascular: Calcified atherosclerosis at the skull base. No hyperdense vessel. Skull: No acute fracture or suspicious lesion. Sinuses/Orbits: Visualized paranasal sinuses and mastoid air cells are clear. Bilateral cataract extraction. Other: None. CT CERVICAL SPINE FINDINGS Alignment: Chronic mild reversal of the normal cervical lordosis and trace anterolisthesis of C7 on T1. Skull base and vertebrae: Unchanged chronic fracture through the right lateral aspect of the posterior C1 arch. No acute fracture or suspicious lesion. 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 Soft tissues and spinal canal: No prevertebral fluid or swelling. No visible canal hematoma. Disc levels: Moderately advanced disc degeneration and asymmetric left-sided facet arthrosis throughout much of the cervical spine. Suspected moderate spinal stenosis at C3-4. Moderate multilevel neural foraminal stenosis. Upper chest: The included lung apices are clear. Other: None. IMPRESSION: 1. No evidence of acute intracranial  abnormality or acute cervical spine fracture. 2. Moderate chronic small vessel ischemic disease. 3. Unchanged ventriculomegaly with features which can be seen  with normal pressure hydrocephalus in the appropriate clinical setting. Electronically Signed   By: Sebastian Ache M.D.   On: 09/06/2023 15:38   CT Cervical Spine Wo Contrast Result Date: 09/06/2023 CLINICAL DATA:  Head trauma, minor (Age >= 65y); Neck trauma (Age >= 65y). Fall. EXAM: CT HEAD WITHOUT CONTRAST CT CERVICAL SPINE WITHOUT CONTRAST TECHNIQUE: Multidetector CT imaging of the head and cervical spine was performed following the standard protocol without intravenous contrast. Multiplanar CT image reconstructions of the cervical spine were also generated. RADIATION DOSE REDUCTION: This exam was performed according to the departmental dose-optimization program which includes automated exposure control, adjustment of the mA and/or kV according to patient size and/or use of iterative reconstruction technique. COMPARISON:  CT head 05/06/2023.  CT cervical spine 04/30/2023. FINDINGS: CT HEAD FINDINGS Brain: There is no evidence of an acute infarct, intracranial hemorrhage, mass, midline shift, or extra-axial fluid collection. Patchy hypodensities in the cerebral white matter are unchanged and nonspecific but compatible with moderate chronic small vessel ischemic disease. A chronic lacunar infarct is again noted in the right caudate head. There is cerebral atrophy with unchanged ventriculomegaly which is again noted to be disproportionate to the size of the sulci with an acute callosal angle. Vascular: Calcified atherosclerosis at the skull base. No hyperdense vessel. Skull: No acute fracture or suspicious lesion. Sinuses/Orbits: Visualized paranasal sinuses and mastoid air cells are clear. Bilateral cataract extraction. Other: None. CT CERVICAL SPINE FINDINGS Alignment: Chronic mild reversal of the normal cervical lordosis and trace anterolisthesis of C7 on  T1. Skull base and vertebrae: Unchanged chronic fracture through the right lateral aspect of the posterior C1 arch. No acute fracture or suspicious lesion. 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 Soft tissues and spinal canal: No prevertebral fluid or swelling. No visible canal hematoma. Disc levels: Moderately advanced disc degeneration and asymmetric left-sided facet arthrosis throughout much of the cervical spine. Suspected moderate spinal stenosis at C3-4. Moderate multilevel neural foraminal stenosis. Upper chest: The included lung apices are clear. Other: None. IMPRESSION: 1. No evidence of acute intracranial abnormality or acute cervical spine fracture. 2. Moderate chronic small vessel ischemic disease. 3. Unchanged ventriculomegaly with features which can be seen with normal pressure hydrocephalus in the appropriate clinical setting. Electronically Signed   By: Sebastian Ache M.D.   On: 09/06/2023 15:38    EKG: Independently reviewed.  Sinus rhythm, rate 72, QTc 473.  No significant change from prior.  Assessment/Plan Principal Problem:   Generalized weakness Active Problems:   Acute cystitis   Fall at home, initial encounter   Hypothyroidism   Type 2 diabetes mellitus (HCC)   COPD (chronic obstructive pulmonary disease) (HCC)   Chronic hypoxic respiratory failure, on home oxygen therapy (HCC)  Assessment and Plan: * Generalized weakness Generalized weakness, fall. Recent hospitalization, SNF was recommended patient and family declined.  Now open to the idea of placement. -PT eval  Acute cystitis Presenting with urinary frequency over the past week.  Denies dysuria.  Rules out for sepsis.  Recent hospitalization with septic shock,due to CITROBACTER FREUNDII and ENTEROCOCCUS RAFFINOSUS CAUTI.  Initially treated with Zosyn, transition to ciprofloxacin and daptomycin. -Cipro started in ED, continue, add vancomycin -Add on urine cultures  Chronic hypoxic respiratory failure, on home  oxygen therapy (HCC) Stable. On 2 L at baseline.  COPD (chronic obstructive pulmonary disease) (HCC) Stable. -Resume home regimen  Type 2 diabetes mellitus (HCC) A1 5.8.  Diet controlled. -Daily CBG   DVT prophylaxis: Lovenox Code Status: FULL code Family Communication: None  at bedside Disposition Plan: ~ 2 days Consults called: None Admission status: Inpt Med surg I certify that at the point of admission it is my clinical judgment that the patient will require inpatient hospital care spanning beyond 2 midnights from the point of admission due to high intensity of service, high risk for further deterioration and high frequency of surveillance required.   Author: Onnie Boer, MD 09/06/2023 8:36 PM  For on call review [email or web address].

## 2023-09-06 NOTE — Assessment & Plan Note (Signed)
 Presenting with urinary frequency over the past week.  Denies dysuria.  Rules out for sepsis.  Recent hospitalization with septic shock,due to CITROBACTER FREUNDII and ENTEROCOCCUS RAFFINOSUS CAUTI.  Initially treated with Zosyn, transition to ciprofloxacin and daptomycin. -Cipro started in ED, continue, add vancomycin -Add on urine cultures

## 2023-09-06 NOTE — ED Notes (Signed)
 Family reports that the patient is non-ambulatory.

## 2023-09-06 NOTE — ED Triage Notes (Signed)
 Pt BIB [facility] from home for c/o fall; pt states she tripped and fell landing on her knees  Pt's family wants pt placed in a rehab facility; pt is aware of her family's request

## 2023-09-06 NOTE — ED Provider Notes (Signed)
 [facility] AT [facility] Provider Note   CSN: [id number] Arrival date & time: 09/06/23  1342     History  Chief Complaint  Patient presents with   Weakness    Amanda Armstrong is a 78 y.o. female history including prior CVA, hypertension, dementia, history of frequent UTIs who recently underwent a transurethral resection of a bladder tumor last month, then was admitted later in the month for sepsis associated with UTI presenting today with generalized weakness.  She lives at home with her husband and a daughter, and has a home health aide during week, but has become weak to the point where she is unable to stand even to transfer from her wheelchair to a chair or the toilet.  This morning the aide was trying to transfer her from the wheelchair but she was unable to to weight-bear at all secondary to weakness.  She states she just collapsed landing on her knees, she also endorses hitting her head on the floor, denies dizziness, headache.  She does endorse pain in her bilateral knees since the fall.  With last months admission she was offered rehab placement which she refused, but she now understands and is excepting the fact that she may need to be placed.  She does endorse developing a paretic rash in her groin since her recent antibiotic use, is currently being treated for a yeast infection with topical Monistat.  I spoke with her daughter Amanda Armstrong per phone, who confirms today's events.  She has discussed with her mother and the stepfather and they all are in agreement that they feel she needs rehab placement at this time.  The history is provided by the patient.       Home Medications Prior to Admission medications   Medication Sig Start Date End Date Taking? Authorizing Provider  fluconazole (DIFLUCAN) 100 MG tablet Take 100 mg by mouth daily. 09/04/23  Yes [provider]  ACCU-CHEK GUIDE test strip USE TEST STRIP TO CHECK GLUCOSE TWICE  DAILY BEFORE BREAKFAST AND  BEFORE BEDTIME 02/27/23   Dani Gobble, NP  Accu-Chek Softclix Lancets lancets USE  TO CHECK GLUCOSE TWICE DAILY 04/18/23   Dani Gobble, NP  acetaminophen (TYLENOL) 500 MG tablet Take 1,000 mg by mouth every 6 (six) hours as needed for mild pain.    [provider]  alendronate (FOSAMAX) 70 MG tablet Take 70 mg by mouth every Sunday. 03/18/18   [provider]  ALPRAZolam Prudy Feeler) 1 MG tablet Take 1 mg by mouth at bedtime. 04/22/18   [provider]  aspirin EC 81 MG tablet Take 1 tablet (81 mg total) by mouth daily with breakfast. 08/11/23   Mariea Clonts, Courage, MD  atorvastatin (LIPITOR) 20 MG tablet Take 20 mg by mouth daily at 12 noon. 02/11/18   [provider]  bismuth subsalicylate (PEPTO BISMOL) 262 MG/15ML suspension Take 30 mLs by mouth every 6 (six) hours as needed for indigestion.    [provider]  blood glucose meter kit and supplies Dispense based on patient and insurance preference. Use up to four times daily as directed. (FOR ICD-10 E10.9, E11.9). 03/24/21   Amin, Ankit C, MD  Cholecalciferol (VITAMIN D3) 50 MCG (2000 UT) capsule Take 2,000 Units by mouth daily.    [provider]  DAPTOmycin (CUBICIN) 500 MG injection Inject into the vein. 08/17/23   [provider]  donepezil (ARICEPT) 5 MG tablet Take 5 mg by mouth daily. 08/18/23  [provider]  doxycycline (VIBRAMYCIN) 100 MG capsule Take 1 capsule (100 mg total) by mouth daily. 08/24/23   Donnita Falls, FNP  DULoxetine (CYMBALTA) 30 MG capsule Take 30 mg by mouth daily. 06/20/23   [provider]  furosemide (LASIX) 20 MG tablet Take 20 mg by mouth daily.    [provider]  HYDROcodone-acetaminophen (NORCO) 10-325 MG tablet Take 1 tablet by mouth every 4 (four) hours as needed for moderate pain. 12/02/20   [provider]  hydrOXYzine (ATARAX) 25 MG tablet Take 1 tablet (25 mg total) by mouth every 8  (eight) hours as needed for itching. 08/11/23   Shon Hale, MD  levothyroxine (SYNTHROID) 75 MCG tablet Take 1 tablet (75 mcg total) by mouth daily before breakfast. 02/27/23   Dani Gobble, NP  Menthol-Methyl Salicylate (MUSCLE RUB EX) Apply 1 Application topically daily as needed (pain).    [provider]  nicotine (NICODERM CQ - DOSED IN MG/24 HOURS) 14 mg/24hr patch Place 1 patch (14 mg total) onto the skin daily. 08/12/23   Shon Hale, MD  OXYGEN Inhale 1.5 L/min into the lungs daily as needed (O2 sats below 90 and at bedtime).    [provider]  SANTYL 250 UNIT/GM ointment Apply 1 Application topically daily. 05/22/23   [provider]  Dwyane Luo 200-62.5-25 MCG/ACT AEPB Inhale 1 puff into the lungs daily. 04/18/23   [provider]      Allergies    Iron, Oxycodone, Sudafed [pseudoephedrine hcl], and Chlorhexidine gluconate    Review of Systems   Review of Systems  Constitutional:  Negative for chills and fever.  HENT:  Negative for congestion and sore throat.   Eyes: Negative.   Respiratory:  Negative for chest tightness and shortness of breath.   Cardiovascular:  Negative for chest pain.  Gastrointestinal:  Negative for abdominal pain and nausea.  Genitourinary:  Negative for dysuria.  Musculoskeletal:  Negative for arthralgias, joint swelling and neck pain.  Skin:  Positive for rash. Negative for wound.  Neurological:  Positive for weakness. Negative for dizziness, light-headedness, numbness and headaches.  Psychiatric/Behavioral: Negative.         Has a history of dementia but gives a fair history of current illness and a fair understanding of her medical conditions.  All other systems reviewed and are negative.   Physical Exam Updated Vital Signs BP 134/77   Pulse 75   Temp 97.6 F (36.4 C) (Oral)   Resp (!) 24   Ht 4\' 10"  (1.473 m)   Wt 70.5 kg   SpO2 100%   BMI 32.48 kg/m  Physical Exam Vitals and nursing  note reviewed.  Constitutional:      Appearance: She is well-developed.  HENT:     Head: Normocephalic and atraumatic.  Eyes:     Conjunctiva/sclera: Conjunctivae normal.  Cardiovascular:     Rate and Rhythm: Normal rate and regular rhythm.     Heart sounds: Normal heart sounds.  Pulmonary:     Effort: Pulmonary effort is normal.     Breath sounds: Normal breath sounds. No wheezing.  Abdominal:     General: Bowel sounds are normal.     Palpations: Abdomen is soft.     Tenderness: There is no abdominal tenderness. There is no guarding.  Musculoskeletal:        General: Normal range of motion.     Cervical back: Normal range of motion.  Skin:    General: Skin is  warm and dry.     Findings: Erythema present.     Comments: Erythematous rash bilateral groin and perineum, satellite lesions, suggesting yeast.  Neurological:     Mental Status: She is alert.     ED Results / Procedures / Treatments   Labs (all labs ordered are listed, but only abnormal results are displayed) Labs Reviewed  CBC WITH DIFFERENTIAL/PLATELET - Abnormal; Notable for the following components:      Result Value   RDW 16.2 (*)    All other components within normal limits  BASIC METABOLIC PANEL - Abnormal; Notable for the following components:   Glucose, Bld 156 (*)    BUN 24 (*)    Creatinine, Ser 1.28 (*)    GFR, Estimated 43 (*)    All other components within normal limits  URINALYSIS, ROUTINE W REFLEX MICROSCOPIC - Abnormal; Notable for the following components:   Color, Urine AMBER (*)    APPearance TURBID (*)    Hgb urine dipstick LARGE (*)    Protein, ur 30 (*)    Leukocytes,Ua LARGE (*)    Bacteria, UA MANY (*)    All other components within normal limits  CBG MONITORING, ED - Abnormal; Notable for the following components:   Glucose-Capillary 141 (*)    All other components within normal limits    EKG None  Radiology DG Hips Bilat W or Wo Pelvis 3-4 Views Result Date:  09/06/2023 CLINICAL DATA:  Fall. EXAM: DG HIP (WITH OR WITHOUT PELVIS) 3-4V BILAT COMPARISON:  July 15, 2019. FINDINGS: There is no evidence of hip fracture or dislocation. Moderate osteophyte formation is noted involving the right hip. No significant joint space narrowing is noted. IMPRESSION: Moderate degenerative change of right hip. No acute abnormality seen. Electronically Signed   By: Lupita Raider M.D.   On: 09/06/2023 15:56   DG Knee Complete 4 Views Left Result Date: 09/06/2023 CLINICAL DATA:  Left knee pain after fall. EXAM: LEFT KNEE - COMPLETE 4+ VIEW COMPARISON:  April 03, 2016. FINDINGS: Status post left total knee arthroplasty. Femoral and tibial components are well situated. No fracture or dislocation is noted. IMPRESSION: No acute abnormality seen. Electronically Signed   By: Lupita Raider M.D.   On: 09/06/2023 15:52   DG Knee Complete 4 Views Right Result Date: 09/06/2023 CLINICAL DATA:  Right knee pain after fall. EXAM: RIGHT KNEE - COMPLETE 4+ VIEW COMPARISON:  April 03, 2016. FINDINGS: Status post right total knee arthroplasty. Femoral and tibial components are well situated. No fracture or dislocation. IMPRESSION: No acute abnormality seen. Electronically Signed   By: Lupita Raider M.D.   On: 09/06/2023 15:50   DG Chest Portable 1 View Result Date: 09/06/2023 CLINICAL DATA:  Weakness after fall. EXAM: PORTABLE CHEST 1 VIEW COMPARISON:  August 07, 2023. FINDINGS: The heart size and mediastinal contours are within normal limits. Both lungs are clear. The visualized skeletal structures are unremarkable. IMPRESSION: No active disease. Electronically Signed   By: Lupita Raider M.D.   On: 09/06/2023 15:48   CT HEAD WO CONTRAST Result Date: 09/06/2023 CLINICAL DATA:  Head trauma, minor (Age >= 65y); Neck trauma (Age >= 65y). Fall. EXAM: CT HEAD WITHOUT CONTRAST CT CERVICAL SPINE WITHOUT CONTRAST TECHNIQUE: Multidetector CT imaging of the head and cervical spine was  performed following the standard protocol without intravenous contrast. Multiplanar CT image reconstructions of the cervical spine were also generated. RADIATION DOSE REDUCTION: This exam was performed according to the departmental dose-optimization  program which includes automated exposure control, adjustment of the mA and/or kV according to patient size and/or use of iterative reconstruction technique. COMPARISON:  CT head 05/06/2023.  CT cervical spine 04/30/2023. FINDINGS: CT HEAD FINDINGS Brain: There is no evidence of an acute infarct, intracranial hemorrhage, mass, midline shift, or extra-axial fluid collection. Patchy hypodensities in the cerebral white matter are unchanged and nonspecific but compatible with moderate chronic small vessel ischemic disease. A chronic lacunar infarct is again noted in the right caudate head. There is cerebral atrophy with unchanged ventriculomegaly which is again noted to be disproportionate to the size of the sulci with an acute callosal angle. Vascular: Calcified atherosclerosis at the skull base. No hyperdense vessel. Skull: No acute fracture or suspicious lesion. Sinuses/Orbits: Visualized paranasal sinuses and mastoid air cells are clear. Bilateral cataract extraction. Other: None. CT CERVICAL SPINE FINDINGS Alignment: Chronic mild reversal of the normal cervical lordosis and trace anterolisthesis of C7 on T1. Skull base and vertebrae: Unchanged chronic fracture through the right lateral aspect of the posterior C1 arch. No acute fracture or suspicious lesion. 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 Soft tissues and spinal canal: No prevertebral fluid or swelling. No visible canal hematoma. Disc levels: Moderately advanced disc degeneration and asymmetric left-sided facet arthrosis throughout much of the cervical spine. Suspected moderate spinal stenosis at C3-4. Moderate multilevel neural foraminal stenosis. Upper chest: The included lung apices are clear. Other:  None. IMPRESSION: 1. No evidence of acute intracranial abnormality or acute cervical spine fracture. 2. Moderate chronic small vessel ischemic disease. 3. Unchanged ventriculomegaly with features which can be seen with normal pressure hydrocephalus in the appropriate clinical setting. Electronically Signed   By: Sebastian Ache M.D.   On: 09/06/2023 15:38   CT Cervical Spine Wo Contrast Result Date: 09/06/2023 CLINICAL DATA:  Head trauma, minor (Age >= 65y); Neck trauma (Age >= 65y). Fall. EXAM: CT HEAD WITHOUT CONTRAST CT CERVICAL SPINE WITHOUT CONTRAST TECHNIQUE: Multidetector CT imaging of the head and cervical spine was performed following the standard protocol without intravenous contrast. Multiplanar CT image reconstructions of the cervical spine were also generated. RADIATION DOSE REDUCTION: This exam was performed according to the departmental dose-optimization program which includes automated exposure control, adjustment of the mA and/or kV according to patient size and/or use of iterative reconstruction technique. COMPARISON:  CT head 05/06/2023.  CT cervical spine 04/30/2023. FINDINGS: CT HEAD FINDINGS Brain: There is no evidence of an acute infarct, intracranial hemorrhage, mass, midline shift, or extra-axial fluid collection. Patchy hypodensities in the cerebral white matter are unchanged and nonspecific but compatible with moderate chronic small vessel ischemic disease. A chronic lacunar infarct is again noted in the right caudate head. There is cerebral atrophy with unchanged ventriculomegaly which is again noted to be disproportionate to the size of the sulci with an acute callosal angle. Vascular: Calcified atherosclerosis at the skull base. No hyperdense vessel. Skull: No acute fracture or suspicious lesion. Sinuses/Orbits: Visualized paranasal sinuses and mastoid air cells are clear. Bilateral cataract extraction. Other: None. CT CERVICAL SPINE FINDINGS Alignment: Chronic mild reversal of the  normal cervical lordosis and trace anterolisthesis of C7 on T1. Skull base and vertebrae: Unchanged chronic fracture through the right lateral aspect of the posterior C1 arch. No acute fracture or suspicious lesion. 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 Soft tissues and spinal canal: No prevertebral fluid or swelling. No visible canal hematoma. Disc levels: Moderately advanced disc degeneration and asymmetric left-sided facet arthrosis throughout much of the cervical spine. Suspected moderate spinal stenosis  at C3-4. Moderate multilevel neural foraminal stenosis. Upper chest: The included lung apices are clear. Other: None. IMPRESSION: 1. No evidence of acute intracranial abnormality or acute cervical spine fracture. 2. Moderate chronic small vessel ischemic disease. 3. Unchanged ventriculomegaly with features which can be seen with normal pressure hydrocephalus in the appropriate clinical setting. Electronically Signed   By: Sebastian Ache M.D.   On: 09/06/2023 15:38    Procedures Procedures    Medications Ordered in ED Medications  cefTRIAXone (ROCEPHIN) 1 g in sodium chloride 0.9 % 100 mL IVPB (has no administration in time range)  ciprofloxacin (CIPRO) IVPB 400 mg (has no administration in time range)  sodium chloride 0.9 % bolus 500 mL (has no administration in time range)    ED Course/ Medical Decision Making/ A&P                                 Medical Decision Making Patient presenting with increasing generalized weakness and difficulty with staying in her home safely, having falls despite nursing care for transfers.  A PT evaluation has been ordered in anticipation of eventual rehab placement.  Labs and imaging have been ordered.  She does have UTI, culture has been ordered.  Review of the last culture with her recent admission suggest Cipro may be a good antibiotic choice.  We will plan admission, suspect she may need to proceed to rehab however once her UTI is improved.  Amount  and/or Complexity of Data Reviewed Labs: ordered.    Details: CBC is essentially normal, she has got a WBC count of 8.2.  Creatinine is 1.28 with a BUN of 24, this is significant bump in these numbers, she will be gently hydrated.  Urinalysis turbid, greater than 50 RBCs and WBCs with many bacteria.  This was a straight cath specimen. Radiology: ordered.    Details: CT head and C-spine negative for acute injuries, reviewed pelvis bilateral hip and bilateral knee films, negative for acute fractures. Discussion of management or test interpretation with external provider(s): Patient discussed with Dr. Mariea Clonts  who agrees with admission.  Risk Decision regarding hospitalization.           Final Clinical Impression(s) / ED Diagnoses Final diagnoses:  Weakness  Acute cystitis with hematuria  Dehydration    Rx / DC Orders ED Discharge Orders     None         Victoriano Lain 09/06/23 1740    Lonell Grandchild, MD 09/17/23 1144

## 2023-09-06 NOTE — Assessment & Plan Note (Signed)
Stable.  Resume home regimen 

## 2023-09-06 NOTE — Assessment & Plan Note (Addendum)
 A1 5.8.  Diet controlled. -Daily CBG

## 2023-09-06 NOTE — Assessment & Plan Note (Deleted)
 PT eval.

## 2023-09-06 NOTE — Assessment & Plan Note (Addendum)
 Generalized weakness, fall. Recent hospitalization, SNF was recommended patient and family declined.  Now open to the idea of placement. -PT eval

## 2023-09-06 NOTE — Consult Note (Signed)
 Pharmacy Antibiotic Note  Amanda Armstrong is a 78 y.o. female admitted on 09/06/2023 with UTI.  Patient has Enterococcus raffinosus in urine culture on 08/07/23.  Pharmacy has been consulted for vancomycin dosing.  Plan: Give vancomycin 1250 mg IV x 1, then start vancomycin 750 mg IV every 24 hours Estimated AUC 441, Cmin 10 IBW, Scr 1.28, Vd 0.5 Vancomycin level at steady state Follow renal function and cultures for adjustments  Height: 4\' 10"  (147.3 cm) Weight: 70.5 kg (155 lb 6.8 oz) IBW/kg (Calculated) : 40.9  Temp (24hrs), Avg:97.8 F (36.6 C), Min:97.6 F (36.4 C), Max:98.2 F (36.8 C)  Recent Labs  Lab 09/06/23 1427  WBC 8.2  CREATININE 1.28*    Estimated Creatinine Clearance: 29.6 mL/min (A) (by C-G formula based on SCr of 1.28 mg/dL (H)).    Allergies  Allergen Reactions   Iron Other (See Comments)    "upset stomach"   Oxycodone Other (See Comments)    "Caused her to feel like she is out of her body"   Sudafed [Pseudoephedrine Hcl] Other (See Comments)    "funny feeling"   Chlorhexidine Gluconate Rash    Antimicrobials this admission: Vancomycin 2/19 >>  Ceftriaxone x 1 2/19 Ciprofloxacin x 1 2/19  Dose adjustments this admission: N/A  Microbiology results: 2/19 UCx: pending   Thank you for allowing pharmacy to be a part of this patient's care.  Barrie Folk, PharmD 09/06/2023 8:37 PM

## 2023-09-06 NOTE — Assessment & Plan Note (Signed)
 Stable. On 2 L at baseline.

## 2023-09-07 DIAGNOSIS — R531 Weakness: Secondary | ICD-10-CM | POA: Diagnosis not present

## 2023-09-07 LAB — CBC
HCT: 36.5 % (ref 36.0–46.0)
Hemoglobin: 11.7 g/dL — ABNORMAL LOW (ref 12.0–15.0)
MCH: 30.5 pg (ref 26.0–34.0)
MCHC: 32.1 g/dL (ref 30.0–36.0)
MCV: 95.1 fL (ref 80.0–100.0)
Platelets: 205 10*3/uL (ref 150–400)
RBC: 3.84 MIL/uL — ABNORMAL LOW (ref 3.87–5.11)
RDW: 15.9 % — ABNORMAL HIGH (ref 11.5–15.5)
WBC: 10.1 10*3/uL (ref 4.0–10.5)
nRBC: 0 % (ref 0.0–0.2)

## 2023-09-07 LAB — BASIC METABOLIC PANEL
Anion gap: 11 (ref 5–15)
BUN: 18 mg/dL (ref 8–23)
CO2: 26 mmol/L (ref 22–32)
Calcium: 8.8 mg/dL — ABNORMAL LOW (ref 8.9–10.3)
Chloride: 103 mmol/L (ref 98–111)
Creatinine, Ser: 1.11 mg/dL — ABNORMAL HIGH (ref 0.44–1.00)
GFR, Estimated: 51 mL/min — ABNORMAL LOW (ref >60)
Glucose, Bld: 128 mg/dL — ABNORMAL HIGH (ref 70–99)
Potassium: 3.6 mmol/L (ref 3.5–5.1)
Sodium: 140 mmol/L (ref 135–145)

## 2023-09-07 LAB — CBG MONITORING, ED: Glucose-Capillary: 95 mg/dL (ref 70–99)

## 2023-09-07 MED ORDER — NYSTATIN 100000 UNIT/GM EX POWD
Freq: Two times a day (BID) | CUTANEOUS | Status: DC
  Administered 2023-09-07: 1 via TOPICAL
  Filled 2023-09-07: qty 15

## 2023-09-07 NOTE — Evaluation (Signed)
 Occupational Therapy Evaluation Patient Details Name: Amanda Armstrong MRN: [id number] DOB: [birth year] Today's Date: 09/07/2023   History of Present Illness   Amanda Armstrong is a 78 y.o. female with medical history significant for chronic respiratory failure, COPD, CKD 3B, diabetes mellitus, hypertension, stroke.  Patient presented to the ED with complaints of generalized weakness over the past week.  She also reports urinary frequency over the past week, without dysuria.  At baseline she has poor oral intake and this is unchanged.  No vomiting no diarrhea.  She presented today with reports of a fall, with patient tripping and falling on her knees despite the assistance of her home aide.  She denies dizziness.  Family also requesting rehab placement due to patient's increasing weakness. (per MD)     Clinical Impressions Pt agreeable to OT and PT co-evaluation. Pt is assisted for bathing and dressing at baseline. Reports ability to ambulate with RW without assist at baseline. Today pt required max A for bed mobility and mod to max A for transfer to the chair. Poor seated and standing balance noted today with B UE general weakness. L lateral lean noted when pt seated at EOB. Poor visual tracking and possible L visual field deficit. These may be baseline issue from previous stroke. No family present to confirm baseline. Pt left in the chair with the call bell within reach. Pt will benefit from continued OT in the hospital and recommended venue below to increase strength, balance, and endurance for safe ADL's.        If plan is discharge home, recommend the following:   A lot of help with walking and/or transfers;A lot of help with bathing/dressing/bathroom;Assistance with cooking/housework;Assist for transportation;Help with stairs or ramp for entrance     Functional Status Assessment   Patient has had a recent decline in their functional status and demonstrates the ability to make  significant improvements in function in a reasonable and predictable amount of time.     Equipment Recommendations   None recommended by OT             Precautions/Restrictions   Precautions Precautions: Fall Recall of Precautions/Restrictions: Impaired Restrictions Weight Bearing Restrictions Per Provider Order: No     Mobility Bed Mobility Overal bed mobility: Needs Assistance Bed Mobility: Supine to Sit     Supine to sit: Max assist, HOB elevated     General bed mobility comments: Very weak; much assist to pull to sit.    Transfers Overall transfer level: Needs assistance Equipment used: Rolling walker (2 wheels) Transfers: Sit to/from Stand, Bed to chair/wheelchair/BSC Sit to Stand: Mod assist, Max assist     Step pivot transfers: Max assist     General transfer comment: Poor weight bearing; difficulty stepping and pivoting to chair with use of RW.      Balance Overall balance assessment: Needs assistance Sitting-balance support: Bilateral upper extremity supported, Feet supported Sitting balance-Leahy Scale: Poor   Postural control: Left lateral lean Standing balance support: Bilateral upper extremity supported, During functional activity, Reliant on assistive device for balance Standing balance-Leahy Scale: Poor Standing balance comment: using RW                           ADL either performed or assessed with clinical judgement   ADL Overall ADL's : Needs assistance/impaired     Grooming: Set up;Sitting;Contact guard assist   Upper Body Bathing: Sitting;Minimal assistance;Contact guard assist   Lower Body  Bathing: Maximal assistance;Sitting/lateral leans   Upper Body Dressing : Minimal assistance;Set up;Sitting   Lower Body Dressing: Maximal assistance;Sitting/lateral leans   Toilet Transfer: Maximal assistance;Moderate assistance;Rolling walker (2 wheels);Stand-pivot Statistician Details (indicate cue type and reason):  Simulated via EOB to chair transfer. Toileting- Clothing Manipulation and Hygiene: Maximal assistance;Bed level       Functional mobility during ADLs: Maximal assistance;Rolling walker (2 wheels)       Vision Baseline Vision/History: 1 Wears glasses Ability to See in Adequate Light: 1 Impaired Patient Visual Report: No change from baseline Vision Assessment?: Vision impaired- to be further tested in functional context;Yes Ocular Range of Motion:  (Pt often having a central fixed gaze.) Tracking/Visual Pursuits:  (Very poor when attempted. Unsure of baseline abilities.) Visual Fields: Other (comment) (Possible R visual field cut, but possibly baseline from previous strokes.)     Perception Perception: Not tested       Praxis Praxis: Not tested       Pertinent Vitals/Pain Pain Assessment Pain Assessment: 0-10 Pain Score: 10-Worst pain ever Pain Location: Bilateral knees Pain Descriptors / Indicators: Aching Pain Intervention(s): Monitored during session, Repositioned     Extremity/Trunk Assessment Upper Extremity Assessment Upper Extremity Assessment: Generalized weakness (mild A/ROM deficit bilaterally for shoulder flexion)   Lower Extremity Assessment Lower Extremity Assessment: Defer to PT evaluation   Cervical / Trunk Assessment Cervical / Trunk Assessment: Kyphotic   Communication Communication Communication: No apparent difficulties   Cognition Arousal: Alert Behavior During Therapy: Flat affect Cognition: No family/caregiver present to determine baseline             OT - Cognition Comments: Able to follow commands with cuing. General flat affect.                 Following commands: Intact       Cueing  General Comments   Cueing Techniques: Verbal cues;Tactile cues                 Home Living Family/patient expects to be discharged to:: Private residence Living Arrangements: Spouse/significant other Available Help at Discharge:  Family;Personal care attendant (Care attendants 8 hours a day) Type of Home: House Home Access: Ramped entrance     Home Layout: One level     Bathroom Shower/Tub: Chief Strategy Officer: Handicapped height Bathroom Accessibility: Yes How Accessible: Accessible via walker Home Equipment: Rolling Walker (2 wheels);Rollator (4 wheels);BSC/3in1;Shower seat;Grab bars - toilet;Lift chair;Grab bars - tub/shower   Additional Comments: Pt reports no change in home set up.      Prior Functioning/Environment Prior Level of Function : Needs assist       Physical Assist : ADLs (physical)   ADLs (physical): Bathing;Dressing;IADLs Mobility Comments: RW ambulation in the home; w/c for community. ADLs Comments: Assist for bathing and dressing. Assist IADL's. Abl eto feed herself.    OT Problem List: Decreased strength;Decreased range of motion;Decreased activity tolerance;Impaired balance (sitting and/or standing);Impaired vision/perception;Decreased safety awareness;Obesity   OT Treatment/Interventions: Self-care/ADL training;Therapeutic exercise;DME and/or AE instruction;Therapeutic activities;Visual/perceptual remediation/compensation;Patient/family education;Balance training      OT Goals(Current goals can be found in the care plan section)   Acute Rehab OT Goals Patient Stated Goal: Improve function; open to rehab. OT Goal Formulation: With patient Time For Goal Achievement: 09/21/23 Potential to Achieve Goals: Good   OT Frequency:  Min 2X/week    Co-evaluation PT/OT/SLP Co-Evaluation/Treatment: Yes Reason for Co-Treatment: To address functional/ADL transfers   OT goals addressed during session: ADL's and self-care  End of Session Equipment Utilized During Treatment: Rolling walker (2 wheels)  Activity Tolerance: Patient tolerated treatment well Patient left: in chair;with call bell/phone within reach  OT Visit Diagnosis:  Unsteadiness on feet (R26.81);Other abnormalities of gait and mobility (R26.89);Muscle weakness (generalized) (M62.81)                Time: 1308-6578 OT Time Calculation (min): 15 min Charges:  OT General Charges $OT Visit: 1 Visit OT Evaluation $OT Eval Low Complexity: 1 Low  Amanda Armstrong OT, MOT  Amanda Armstrong 09/07/2023, 9:57 AM

## 2023-09-07 NOTE — ED Notes (Addendum)
 Redness and irritation to groin areas when cleaned pt for urine incontinence.  Redness to bottom that blanches, Mepilex border sacrum foam dsg applied. Pt noted to have rash all over.  Dr. Sherryll Burger notified

## 2023-09-07 NOTE — ED Notes (Signed)
 Pt recommends rehab for pt

## 2023-09-07 NOTE — Evaluation (Signed)
 Physical Therapy Evaluation Patient Details Name: MARVELLE SPAN MRN: [id number] DOB: 1945/10/22 Today's Date: 09/07/2023  History of Present Illness  Kyonna HUNTER PINKARD is a 78 y.o. female with medical history significant for chronic respiratory failure, COPD, CKD 3B, diabetes mellitus, hypertension, stroke.  Patient presented to the ED with complaints of generalized weakness over the past week.  She also reports urinary frequency over the past week, without dysuria.  At baseline she has poor oral intake and this is unchanged.  No vomiting no diarrhea.  She presented today with reports of a fall, with patient tripping and falling on her knees despite the assistance of her home aide.  She denies dizziness.  Family also requesting rehab placement due to patient's increasing weakness. (per MD)   Clinical Impression  Patient was agreeable to therapy. Patient was max assist for almost all tasks assessed. RW was used for transfer and few steps of ambulation. Patient required max cueing to move feet while taking steps for transfer. Heavy reliance on RW. Limited due to fatigue. Patient left in chair at conclusion of session. Patient will benefit from continued skilled physical therapy in hospital and recommended venue below to increase strength, balance, endurance for safe ADLs and gait.          If plan is discharge home, recommend the following: A lot of help with walking and/or transfers;A lot of help with bathing/dressing/bathroom;Assistance with cooking/housework;Help with stairs or ramp for entrance   Can travel by private vehicle   No    Equipment Recommendations None recommended by PT  Recommendations for Other Services       Functional Status Assessment Patient has had a recent decline in their functional status and demonstrates the ability to make significant improvements in function in a reasonable and predictable amount of time.     Precautions / Restrictions Precautions Precautions:  Fall Recall of Precautions/Restrictions: Impaired Restrictions Weight Bearing Restrictions Per Provider Order: No      Mobility  Bed Mobility Overal bed mobility: Needs Assistance Bed Mobility: Supine to Sit     Supine to sit: Max assist, HOB elevated     General bed mobility comments: Very weak; much assist to pull to sit.    Transfers Overall transfer level: Needs assistance Equipment used: Rolling walker (2 wheels) Transfers: Sit to/from Stand, Bed to chair/wheelchair/BSC Sit to Stand: Mod assist, Max assist   Step pivot transfers: Max assist       General transfer comment: Poor weight bearing; difficulty stepping and pivoting to chair with use of RW.    Ambulation/Gait Ambulation/Gait assistance: Mod assist, Max assist Gait Distance (Feet): 3 Feet (side steps) Assistive device: Rolling walker (2 wheels) Gait Pattern/deviations: Step-to pattern, Decreased step length - right, Decreased step length - left Gait velocity: slow     General Gait Details: slow, unsteady, struggle to move feet for few steps to chair  Stairs            Wheelchair Mobility     Tilt Bed    Modified Rankin (Stroke Patients Only)       Balance Overall balance assessment: Needs assistance Sitting-balance support: Bilateral upper extremity supported, Feet supported Sitting balance-Leahy Scale: Poor   Postural control: Left lateral lean Standing balance support: Bilateral upper extremity supported, During functional activity, Reliant on assistive device for balance Standing balance-Leahy Scale: Poor Standing balance comment: using RW  Pertinent Vitals/Pain Pain Assessment Pain Assessment: 0-10 Pain Score: 10-Worst pain ever Pain Location: Bilateral knees Pain Descriptors / Indicators: Aching Pain Intervention(s): Monitored during session, Repositioned    Home Living Family/patient expects to be discharged to:: Private  residence Living Arrangements: Spouse/significant other Available Help at Discharge: Family;Personal care attendant (Care attendants 8 hours a day) Type of Home: House Home Access: Ramped entrance       Home Layout: One level Home Equipment: Agricultural consultant (2 wheels);Rollator (4 wheels);BSC/3in1;Shower seat;Grab bars - toilet;Lift chair;Grab bars - tub/shower Additional Comments: Pt reports no change in home set up.    Prior Function Prior Level of Function : Needs assist       Physical Assist : ADLs (physical)   ADLs (physical): Bathing;Dressing;IADLs Mobility Comments: RW ambulation in the home; w/c for community. ADLs Comments: Assist for bathing and dressing. Assist IADL's. Abl eto feed herself.     Extremity/Trunk Assessment   Upper Extremity Assessment Upper Extremity Assessment: Defer to OT evaluation    Lower Extremity Assessment Lower Extremity Assessment: Generalized weakness    Cervical / Trunk Assessment Cervical / Trunk Assessment: Kyphotic  Communication   Communication Communication: No apparent difficulties    Cognition Arousal: Alert Behavior During Therapy: Flat affect                             Following commands: Intact       Cueing Cueing Techniques: Verbal cues, Tactile cues     General Comments      Exercises     Assessment/Plan    PT Assessment Patient needs continued PT services  PT Problem List Decreased strength;Decreased range of motion;Decreased activity tolerance;Decreased balance       PT Treatment Interventions DME instruction;Gait training;Stair training;Functional mobility training;Therapeutic activities;Therapeutic exercise;Balance training;Patient/family education    PT Goals (Current goals can be found in the Care Plan section)  Acute Rehab PT Goals Patient Stated Goal: to return home after rehab PT Goal Formulation: With patient Time For Goal Achievement: 09/21/23 Potential to Achieve Goals:  Good    Frequency Min 3X/week     Co-evaluation PT/OT/SLP Co-Evaluation/Treatment: Yes Reason for Co-Treatment: To address functional/ADL transfers PT goals addressed during session: Mobility/safety with mobility         AM-PAC PT "6 Clicks" Mobility  Outcome Measure Help needed turning from your back to your side while in a flat bed without using bedrails?: A Lot Help needed moving from lying on your back to sitting on the side of a flat bed without using bedrails?: A Lot Help needed moving to and from a bed to a chair (including a wheelchair)?: A Lot Help needed standing up from a chair using your arms (e.g., wheelchair or bedside chair)?: A Lot Help needed to walk in hospital room?: A Lot Help needed climbing 3-5 steps with a railing? : Total 6 Click Score: 11    End of Session   Activity Tolerance: Patient tolerated treatment well;Patient limited by fatigue Patient left: in chair;with call bell/phone within reach Nurse Communication: Mobility status PT Visit Diagnosis: Unsteadiness on feet (R26.81);Other abnormalities of gait and mobility (R26.89);Muscle weakness (generalized) (M62.81)    Time: 4098-1191 PT Time Calculation (min) (ACUTE ONLY): 18 min   Charges:   PT Evaluation $PT Eval Low Complexity: 1 Low PT Treatments $Therapeutic Activity: 8-22 mins PT General Charges $$ ACUTE PT VISIT: 1 Visit         Jayliani Wanner SPT

## 2023-09-07 NOTE — [place] FL2 (Signed)
 [place] MEDICAID FL2 LEVEL OF CARE FORM     IDENTIFICATION  Patient Name: Amanda Armstrong Birthdate: [birth year] Sex: female Admission Date (Current Location): 09/06/2023  [facility] and [place] Number:  [employer] and Address:  [facility],  [address] [place] [address]      Provider Number: [id number]  Attending Physician Name and Address:  Erick Blinks, DO  Relative Name and Phone Number:       Current Level of Care: Hospital Recommended Level of Care: Skilled Nursing Facility Prior Approval Number:    Date Approved/Denied:   PASRR Number:    Discharge Plan: SNF    Current Diagnoses: Patient Active Problem List   Diagnosis Date Noted   Generalized weakness 09/06/2023   Fall at home, initial encounter 09/06/2023   Septic shock (HCC) 08/08/2023   Bladder tumor 08/03/2023   AKI (acute kidney injury) (HCC) 07/04/2022   Type 2 diabetes mellitus (HCC) 07/04/2022   COPD (chronic obstructive pulmonary disease) (HCC) 07/04/2022   Chronic hypoxic respiratory failure, on home oxygen therapy (HCC) 07/04/2022   Tobacco use 07/04/2022   Acute cystitis 07/04/2022   Acute respiratory failure with hypoxia (HCC) 07/03/2022   COPD GOLD ? / group B still smoking  05/21/2021   Chronic respiratory failure with hypoxia (HCC) 05/21/2021   Uncontrolled type 2 diabetes mellitus with hyperglycemia (HCC) 03/23/2021   Insomnia 03/23/2021   Chronic pain syndrome 03/22/2021   Recurrent falls 03/22/2021   Vertigo 03/22/2021   Multifactorial gait disorder 03/22/2021   Right foot drop 03/22/2021   Hyperosmolar hyperglycemic state (HHS) (HCC) 03/22/2021   Unspecified protein-calorie malnutrition (HCC) 08/07/2020   Altered mental status    Cigarette smoker 08/04/2020   Acute metabolic encephalopathy 08/04/2020   Acute renal failure superimposed on stage 3b chronic kidney disease (HCC) 08/04/2020   COVID-19 08/03/2020   History of falling 10/31/2018   Long  term (current) use of opiate analgesic 09/05/2018   Hyperlipidemia, unspecified 07/18/2017   Age-related osteoporosis without current pathological fracture 07/18/2017   UTI (urinary tract infection) 01/29/2016   Nicotine dependence 09/07/2015   Enteritis due to Clostridium difficile 08/29/2013   Weakness 08/02/2013   Diarrhea 08/02/2013   Acute pyelonephritis 05/28/2013   Lower urinary tract infectious disease 05/27/2013   Tachypnea 05/27/2013   Hyperglycemia 05/27/2013   Arthritis    Anxiety    Hypothyroidism     Orientation RESPIRATION BLADDER Height & Weight     Self, Time, Situation, Place  Normal Continent Weight: 155 lb 6.8 oz (70.5 kg) Height:  4\' 10"  (147.3 cm)  BEHAVIORAL SYMPTOMS/MOOD NEUROLOGICAL BOWEL NUTRITION STATUS      Continent Diet  AMBULATORY STATUS COMMUNICATION OF NEEDS Skin   Extensive Assist Verbally Normal                       Personal Care Assistance Level of Assistance  Bathing, Feeding, Dressing Bathing Assistance: Limited assistance Feeding assistance: Independent Dressing Assistance: Limited assistance     Functional Limitations Info  Sight, Hearing, Speech Sight Info: Adequate Hearing Info: Adequate Speech Info: Adequate    SPECIAL CARE FACTORS FREQUENCY  PT (By licensed PT), OT (By licensed OT)     PT Frequency: 5 times weekly OT Frequency: 5 times weekly            Contractures Contractures Info: Not present    Additional Factors Info  Code Status, Allergies Code Status Info: FULL Allergies Info: Iron, Oxycodone, Sudafed (Pseudoephedrine  Hcl), Chlorhexidine Gluconate           Current Medications (09/07/2023):  This is the current hospital active medication list Current Facility-Administered Medications  Medication Dose Route Frequency Provider Last Rate Last Admin   acetaminophen (TYLENOL) tablet 650 mg  650 mg Oral Q6H PRN Emokpae, Ejiroghene E, MD       Or   acetaminophen (TYLENOL) suppository 650 mg  650 mg  Rectal Q6H PRN Emokpae, Ejiroghene E, MD       ALPRAZolam (XANAX) tablet 1 mg  1 mg Oral QHS Emokpae, Ejiroghene E, MD   1 mg at 09/06/23 2225   aspirin EC tablet 81 mg  81 mg Oral Q breakfast Emokpae, Ejiroghene E, MD   81 mg at 09/07/23 1001   ciprofloxacin (CIPRO) IVPB 400 mg  400 mg Intravenous Q12H Emokpae, Ejiroghene E, MD 200 mL/hr at 09/07/23 0415 400 mg at 09/07/23 0415   donepezil (ARICEPT) tablet 5 mg  5 mg Oral Daily Emokpae, Ejiroghene E, MD   5 mg at 09/07/23 1002   DULoxetine (CYMBALTA) DR capsule 30 mg  30 mg Oral Daily Emokpae, Ejiroghene E, MD   30 mg at 09/07/23 1002   enoxaparin (LOVENOX) injection 40 mg  40 mg Subcutaneous Q24H Emokpae, Ejiroghene E, MD   40 mg at 09/06/23 2223   fluticasone furoate-vilanterol (BREO ELLIPTA) 200-25 MCG/ACT 1 puff  1 puff Inhalation Daily Emokpae, Ejiroghene E, MD   1 puff at 09/07/23 1017   And   umeclidinium bromide (INCRUSE ELLIPTA) 62.5 MCG/ACT 1 puff  1 puff Inhalation Daily Emokpae, Ejiroghene E, MD   1 puff at 09/07/23 1018   furosemide (LASIX) tablet 20 mg  20 mg Oral Daily Emokpae, Ejiroghene E, MD   20 mg at 09/07/23 1002   HYDROcodone-acetaminophen (NORCO) 10-325 MG per tablet 1 tablet  1 tablet Oral Q6H PRN Emokpae, Ejiroghene E, MD       levothyroxine (SYNTHROID) tablet 75 mcg  75 mcg Oral Q0600 Merryl Hacker, RPH   75 mcg at 09/07/23 1610   nicotine (NICODERM CQ - dosed in mg/24 hours) patch 14 mg  14 mg Transdermal Once Emokpae, Ejiroghene E, MD   14 mg at 09/06/23 2005   ondansetron (ZOFRAN) tablet 4 mg  4 mg Oral Q6H PRN Emokpae, Ejiroghene E, MD       Or   ondansetron (ZOFRAN) injection 4 mg  4 mg Intravenous Q6H PRN Emokpae, Ejiroghene E, MD       polyethylene glycol (MIRALAX / GLYCOLAX) packet 17 g  17 g Oral Daily PRN Emokpae, Ejiroghene E, MD       vancomycin (VANCOREADY) IVPB 750 mg/150 mL  750 mg Intravenous Q24H Barrie Folk, [facility]       Current Outpatient Medications  Medication Sig Dispense Refill    acetaminophen (TYLENOL) 500 MG tablet Take 1,000 mg by mouth every 6 (six) hours as needed for mild pain.     alendronate (FOSAMAX) 70 MG tablet Take 70 mg by mouth every Sunday.     ALPRAZolam (XANAX) 1 MG tablet Take 1 mg by mouth at bedtime.     aspirin EC 81 MG tablet Take 1 tablet (81 mg total) by mouth daily with breakfast. 30 tablet 12   atorvastatin (LIPITOR) 20 MG tablet Take 20 mg by mouth daily at 12 noon.     Cholecalciferol (VITAMIN D3) 50 MCG (2000 UT) capsule Take 2,000 Units by mouth daily.     donepezil (ARICEPT) 5 MG  tablet Take 5 mg by mouth daily.     doxycycline (VIBRAMYCIN) 100 MG capsule Take 1 capsule (100 mg total) by mouth daily. 30 capsule 0   DULoxetine (CYMBALTA) 30 MG capsule Take 30 mg by mouth daily.     fluconazole (DIFLUCAN) 100 MG tablet Take 100 mg by mouth daily.     furosemide (LASIX) 20 MG tablet Take 20 mg by mouth daily.     HYDROcodone-acetaminophen (NORCO) 10-325 MG tablet Take 1 tablet by mouth every 4 (four) hours as needed for moderate pain.     levothyroxine (SYNTHROID) 75 MCG tablet Take 1 tablet (75 mcg total) by mouth daily before breakfast. 90 tablet 1   Menthol-Methyl Salicylate (MUSCLE RUB EX) Apply 1 Application topically daily as needed (pain).     OXYGEN Inhale 1.5-2 L/min into the lungs daily as needed (O2 sats below 90 and at bedtime).     TRELEGY ELLIPTA 200-62.5-25 MCG/ACT AEPB Inhale 1 puff into the lungs daily.     ACCU-CHEK GUIDE test strip USE TEST STRIP TO CHECK GLUCOSE TWICE DAILY BEFORE BREAKFAST AND  BEFORE BEDTIME 200 each 2   Accu-Chek Softclix Lancets lancets USE  TO CHECK GLUCOSE TWICE DAILY 100 each 2   blood glucose meter kit and supplies Dispense based on patient and insurance preference. Use up to four times daily as directed. (FOR ICD-10 E10.9, E11.9). 1 each 0   ciprofloxacin (CIPRO) 500 MG tablet Take 500 mg by mouth 2 (two) times daily.     DAPTOmycin (CUBICIN) 500 MG injection Inject into the vein.     nicotine  (NICODERM CQ - DOSED IN MG/24 HOURS) 14 mg/24hr patch Place 1 patch (14 mg total) onto the skin daily. (Patient not taking: Reported on 09/06/2023) 28 patch 0     Discharge Medications: Please see discharge summary for a list of discharge medications.  Relevant Imaging Results:  Relevant Lab Results:   Additional Information SSN: [id number], [place]

## 2023-09-07 NOTE — Plan of Care (Cosign Needed)
  Problem: Acute Rehab PT Goals(only PT should resolve) Goal: Pt Will Go Supine/Side To Sit Outcome: Progressing Flowsheets (Taken 09/07/2023 1338) Pt will go Supine/Side to Sit: with moderate assist Goal: Patient Will Transfer Sit To/From Stand Outcome: Progressing Flowsheets (Taken 09/07/2023 1338) Patient will transfer sit to/from stand: with moderate assist Goal: Pt Will Transfer Bed To Chair/Chair To Bed Outcome: Progressing Flowsheets (Taken 09/07/2023 1338) Pt will Transfer Bed to Chair/Chair to Bed: with mod assist Goal: Pt Will Ambulate Outcome: Progressing Flowsheets (Taken 09/07/2023 1338) Pt will Ambulate:  10 feet  with moderate assist  with rolling walker    Amanda Armstrong SPT

## 2023-09-07 NOTE — ED Notes (Signed)
 Spoke with daughter, [name], gave update on plan of care

## 2023-09-07 NOTE — TOC Initial Note (Signed)
 Transition of Care [facility]) - Initial/Assessment Note    Patient Details  Name: Amanda Armstrong MRN: [id number] Date of Birth: 1946-06-07  [facility]) CM/SW Contact:    Villa Herb, LCSWA Phone Number: 09/07/2023, 11:44 AM  Clinical Narrative:                 CSW updated that PT is recommending SNF for pt. CSW spoke with pts daughter who states that pt and family are agreeable to SNF at this time. Pts daughter requests that referral be sent to [facility] rehab and [employer] [facility]. CSW to complete referral and sent out. TOC to follow.   Expected Discharge Plan: Skilled Nursing Facility Barriers to Discharge: Continued Medical Work up   Patient Goals and CMS Choice Patient states their goals for this hospitalization and ongoing recovery are:: go to SNF CMS Medicare.gov Compare Post Acute Care list provided to:: Patient Represenative (must comment) Choice offered to / list presented to : Adult Children [place] ownership interest in [facility]provided to:: Adult Children    Expected Discharge Plan and Services In-house Referral: Clinical Social Work Discharge Planning Services: CM Consult Post Acute Care Choice: Skilled Nursing Facility Living arrangements for the past 2 months: Single Family Home                                      Prior Living Arrangements/Services Living arrangements for the past 2 months: Single Family Home Lives with:: Adult Children Patient language and need for interpreter reviewed:: Yes Do you feel safe going back to the place where you live?: Yes      Need for Family Participation in Patient Care: Yes (Comment) Care giver support system in place?: Yes (comment) Current home services: DME (walker, shower chair, lift chair, raised toilet seat, wheelchair, CAP aid.) Criminal Activity/Legal Involvement Pertinent to Current Situation/Hospitalization: No - Comment as needed  Activities of Daily Living   ADL  Screening (condition at time of admission) Independently performs ADLs?: No Does the patient have a NEW difficulty with bathing/dressing/toileting/self-feeding that is expected to last >3 days?: Yes (Initiates electronic notice to provider for possible OT consult) Does the patient have a NEW difficulty with getting in/out of bed, walking, or climbing stairs that is expected to last >3 days?: Yes (Initiates electronic notice to provider for possible PT consult) Does the patient have a NEW difficulty with communication that is expected to last >3 days?: No Is the patient deaf or have difficulty hearing?: No Does the patient have difficulty seeing, even when wearing glasses/contacts?: No Does the patient have difficulty concentrating, remembering, or making decisions?: No  Permission Sought/Granted                  Emotional Assessment Appearance:: Appears stated age Attitude/Demeanor/Rapport: Engaged Affect (typically observed): Accepting Orientation: : Oriented to Self, Oriented to Place, Oriented to  Time, Oriented to Situation Alcohol / Substance Use: Not Applicable Psych Involvement: No (comment)  Admission diagnosis:  Generalized weakness [R53.1] Patient Active Problem List   Diagnosis Date Noted   Generalized weakness 09/06/2023   Fall at home, initial encounter 09/06/2023   Septic shock (HCC) 08/08/2023   Bladder tumor 08/03/2023   AKI (acute kidney injury) (HCC) 07/04/2022   Type 2 diabetes mellitus (HCC) 07/04/2022   COPD (chronic obstructive pulmonary disease) (HCC) 07/04/2022   Chronic hypoxic respiratory failure, on home oxygen therapy (HCC)  07/04/2022   Tobacco use 07/04/2022   Acute cystitis 07/04/2022   Acute respiratory failure with hypoxia (HCC) 07/03/2022   COPD GOLD ? / group B still smoking  05/21/2021   Chronic respiratory failure with hypoxia (HCC) 05/21/2021   Uncontrolled type 2 diabetes mellitus with hyperglycemia (HCC) 03/23/2021   Insomnia 03/23/2021    Chronic pain syndrome 03/22/2021   Recurrent falls 03/22/2021   Vertigo 03/22/2021   Multifactorial gait disorder 03/22/2021   Right foot drop 03/22/2021   Hyperosmolar hyperglycemic state (HHS) (HCC) 03/22/2021   Unspecified protein-calorie malnutrition (HCC) 08/07/2020   Altered mental status    Cigarette smoker 08/04/2020   Acute metabolic encephalopathy 08/04/2020   Acute renal failure superimposed on stage 3b chronic kidney disease (HCC) 08/04/2020   COVID-19 08/03/2020   History of falling 10/31/2018   Long term (current) use of opiate analgesic 09/05/2018   Hyperlipidemia, unspecified 07/18/2017   Age-related osteoporosis without current pathological fracture 07/18/2017   UTI (urinary tract infection) 01/29/2016   Nicotine dependence 09/07/2015   Enteritis due to Clostridium difficile 08/29/2013   Weakness 08/02/2013   Diarrhea 08/02/2013   Acute pyelonephritis 05/28/2013   Lower urinary tract infectious disease 05/27/2013   Tachypnea 05/27/2013   Hyperglycemia 05/27/2013   Arthritis    Anxiety    Hypothyroidism    PCP:  [name], MD Pharmacy:   [facility] - [place], [place] - [address] [place] [address] Phone: [phone] Fax: [phone]     Social Drivers of Health (SDOH) Social History: SDOH Screenings   Food Insecurity: No Food Insecurity (09/06/2023)  Housing: Low Risk  (09/06/2023)  Transportation Needs: No Transportation Needs (09/06/2023)  Utilities: Not At Risk (09/06/2023)  Depression (PHQ2-9): Low Risk  (08/23/2023)  Social Connections: Socially Integrated (09/06/2023)  Recent Concern: Social Connections - Moderately Isolated (08/08/2023)  Tobacco Use: High Risk (09/06/2023)   SDOH Interventions:     Readmission Risk Interventions    09/07/2023   11:42 AM 08/11/2023   12:07 PM 08/08/2023    2:54 PM  Readmission Risk Prevention Plan  Transportation Screening Complete Complete Complete  PCP or  Specialist Appt within 5-7 Days  Complete Not Complete  Home Care Screening Complete  Complete  Medication Review (RN CM) Complete  Complete

## 2023-09-07 NOTE — Progress Notes (Signed)
 PROGRESS NOTE    Amanda Armstrong  UXL:[id number] DOB: 02-19-1946 DOA: 09/06/2023 PCP: [name], MD   Brief Narrative:    Amanda Armstrong is a 78 y.o. female with medical history significant for chronic respiratory failure, COPD, CKD 3B, diabetes mellitus, hypertension, stroke. Patient presented to the ED with complaints of generalized weakness over the past week.  She also reports urinary frequency over the past week, without dysuria.  At baseline she has poor oral intake and this is unchanged.  No vomiting no diarrhea.  She presented today with reports of a fall, with patient tripping and falling on her knees despite the assistance of her home aide.  She denies dizziness. Family also requesting rehab placement due to patient's increasing weakness.  Patient was recently hospitalized 1/20 to 1/24 for septic shock, requiring Levophed, with urine cultures growing Citrobacter and Enterococcus.  She was discharged on ciprofloxacin and daptomycin.   SNF placement was offered, but family and patient refused.  Home health PT and RN was arranged for her on discharge. Patient and family now open to the idea of rehab.  Assessment & Plan:   Principal Problem:   Generalized weakness Active Problems:   Acute cystitis   Fall at home, initial encounter   Hypothyroidism   Type 2 diabetes mellitus (HCC)   COPD (chronic obstructive pulmonary disease) (HCC)   Chronic hypoxic respiratory failure, on home oxygen therapy (HCC)  Assessment and Plan:  Generalized weakness Generalized weakness, fall. Recent hospitalization, SNF was recommended patient and family declined.  Now open to the idea of placement. -PT eval pending   Acute cystitis Presenting with urinary frequency over the past week.  Denies dysuria.  Rules out for sepsis.  Recent hospitalization with septic shock,due to CITROBACTER FREUNDII and ENTEROCOCCUS RAFFINOSUS CAUTI.  Initially treated with Zosyn, transition to ciprofloxacin and  daptomycin. -Cipro started in ED, continue, add vancomycin -Add on urine cultures which are pending   Chronic hypoxic respiratory failure, on home oxygen therapy (HCC) Stable. On 2 L at baseline.   COPD (chronic obstructive pulmonary disease) (HCC) Stable. -Resume home regimen   Type 2 diabetes mellitus (HCC) A1 5.8.  Diet controlled. -Daily CBG  Obesity class I -BMI 32.48    DVT prophylaxis:Lovenox Code Status: Full Family Communication: None at bedside Disposition Plan: Continue IV antibiotics for UTI and plan for SNF placement Status is: Inpatient Remains inpatient appropriate because: Need for IV antibiotics   Consultants:  None  Procedures:  None  Antimicrobials:  Anti-infectives (From admission, onward)    Start     Dose/Rate Route Frequency Ordered Stop   09/07/23 2100  vancomycin (VANCOREADY) IVPB 750 mg/150 mL        750 mg 150 mL/hr over 60 Minutes Intravenous Every 24 hours 09/06/23 2056     09/07/23 0400  ciprofloxacin (CIPRO) IVPB 400 mg        400 mg 200 mL/hr over 60 Minutes Intravenous Every 12 hours 09/06/23 2134     09/06/23 2100  vancomycin (VANCOREADY) IVPB 1250 mg/250 mL        1,250 mg 166.7 mL/hr over 90 Minutes Intravenous  Once 09/06/23 2056 09/06/23 2327   09/06/23 1715  ciprofloxacin (CIPRO) IVPB 400 mg        400 mg 200 mL/hr over 60 Minutes Intravenous  Once 09/06/23 1700 09/06/23 1957   09/06/23 1700  cefTRIAXone (ROCEPHIN) 1 g in sodium chloride 0.9 % 100 mL IVPB        1 g  200 mL/hr over 30 Minutes Intravenous  Once 09/06/23 1651 09/06/23 1824      Subjective: Patient seen and evaluated today with no new acute complaints or concerns. No acute concerns or events noted overnight.  Objective: Vitals:   09/07/23 0515 09/07/23 0600 09/07/23 0815 09/07/23 0847  BP: 124/67 128/71 118/62   Pulse: 99 94 88   Resp: (!) 21 (!) 28 (!) 25   Temp:    97.8 F (36.6 C)  TempSrc:    Oral  SpO2: 96% 95% 95%   Weight:      Height:         Intake/Output Summary (Last 24 hours) at 09/07/2023 0906 Last data filed at 09/06/2023 1857 Gross per 24 hour  Intake 632.78 ml  Output --  Net 632.78 ml   Filed Weights   09/06/23 1358  Weight: 70.5 kg    Examination:  General exam: Appears calm and comfortable  Respiratory system: Clear to auscultation. Respiratory effort normal. Cardiovascular system: S1 & S2 heard, RRR.  Gastrointestinal system: Abdomen is soft Central nervous system: Alert and awake Extremities: No edema Skin: No significant lesions noted Psychiatry: Flat affect.    Data Reviewed: I have personally reviewed following labs and imaging studies  CBC: Recent Labs  Lab 09/06/23 1427 09/07/23 0456  WBC 8.2 10.1  NEUTROABS 5.8  --   HGB 12.5 11.7*  HCT 40.0 36.5  MCV 95.2 95.1  PLT 230 205   Basic Metabolic Panel: Recent Labs  Lab 09/06/23 1427 09/07/23 0456  NA 140 140  K 4.0 3.6  CL 102 103  CO2 29 26  GLUCOSE 156* 128*  BUN 24* 18  CREATININE 1.28* 1.11*  CALCIUM 9.1 8.8*   GFR: Estimated Creatinine Clearance: 34.2 mL/min (A) (by C-G formula based on SCr of 1.11 mg/dL (H)). Liver Function Tests: No results for input(s): "AST", "ALT", "ALKPHOS", "BILITOT", "PROT", "ALBUMIN" in the last 168 hours. No results for input(s): "LIPASE", "AMYLASE" in the last 168 hours. No results for input(s): "AMMONIA" in the last 168 hours. Coagulation Profile: No results for input(s): "INR", "PROTIME" in the last 168 hours. Cardiac Enzymes: No results for input(s): "CKTOTAL", "CKMB", "CKMBINDEX", "TROPONINI" in the last 168 hours. BNP (last 3 results) No results for input(s): "PROBNP" in the last 8760 hours. HbA1C: No results for input(s): "HGBA1C" in the last 72 hours. CBG: Recent Labs  Lab 09/06/23 1556 09/07/23 0820  GLUCAP 141* 95   Lipid Profile: No results for input(s): "CHOL", "HDL", "LDLCALC", "TRIG", "CHOLHDL", "LDLDIRECT" in the last 72 hours. Thyroid Function Tests: No results  for input(s): "TSH", "T4TOTAL", "FREET4", "T3FREE", "THYROIDAB" in the last 72 hours. Anemia Panel: No results for input(s): "VITAMINB12", "FOLATE", "FERRITIN", "TIBC", "IRON", "RETICCTPCT" in the last 72 hours. Sepsis Labs: No results for input(s): "PROCALCITON", "LATICACIDVEN" in the last 168 hours.  No results found for this or any previous visit (from the past 240 hours).       Radiology Studies: DG Hips Bilat W or Wo Pelvis 3-4 Views Result Date: 09/06/2023 CLINICAL DATA:  Fall. EXAM: DG HIP (WITH OR WITHOUT PELVIS) 3-4V BILAT COMPARISON:  July 15, 2019. FINDINGS: There is no evidence of hip fracture or dislocation. Moderate osteophyte formation is noted involving the right hip. No significant joint space narrowing is noted. IMPRESSION: Moderate degenerative change of right hip. No acute abnormality seen. Electronically Signed   By: Lupita Raider M.D.   On: 09/06/2023 15:56   DG Knee Complete 4 Views Left Result Date:  09/06/2023 CLINICAL DATA:  Left knee pain after fall. EXAM: LEFT KNEE - COMPLETE 4+ VIEW COMPARISON:  April 03, 2016. FINDINGS: Status post left total knee arthroplasty. Femoral and tibial components are well situated. No fracture or dislocation is noted. IMPRESSION: No acute abnormality seen. Electronically Signed   By: Lupita Raider M.D.   On: 09/06/2023 15:52   DG Knee Complete 4 Views Right Result Date: 09/06/2023 CLINICAL DATA:  Right knee pain after fall. EXAM: RIGHT KNEE - COMPLETE 4+ VIEW COMPARISON:  April 03, 2016. FINDINGS: Status post right total knee arthroplasty. Femoral and tibial components are well situated. No fracture or dislocation. IMPRESSION: No acute abnormality seen. Electronically Signed   By: Lupita Raider M.D.   On: 09/06/2023 15:50   DG Chest Portable 1 View Result Date: 09/06/2023 CLINICAL DATA:  Weakness after fall. EXAM: PORTABLE CHEST 1 VIEW COMPARISON:  August 07, 2023. FINDINGS: The heart size and mediastinal contours are  within normal limits. Both lungs are clear. The visualized skeletal structures are unremarkable. IMPRESSION: No active disease. Electronically Signed   By: Lupita Raider M.D.   On: 09/06/2023 15:48   CT HEAD WO CONTRAST Result Date: 09/06/2023 CLINICAL DATA:  Head trauma, minor (Age >= 65y); Neck trauma (Age >= 65y). Fall. EXAM: CT HEAD WITHOUT CONTRAST CT CERVICAL SPINE WITHOUT CONTRAST TECHNIQUE: Multidetector CT imaging of the head and cervical spine was performed following the standard protocol without intravenous contrast. Multiplanar CT image reconstructions of the cervical spine were also generated. RADIATION DOSE REDUCTION: This exam was performed according to the departmental dose-optimization program which includes automated exposure control, adjustment of the mA and/or kV according to patient size and/or use of iterative reconstruction technique. COMPARISON:  CT head 05/06/2023.  CT cervical spine 04/30/2023. FINDINGS: CT HEAD FINDINGS Brain: There is no evidence of an acute infarct, intracranial hemorrhage, mass, midline shift, or extra-axial fluid collection. Patchy hypodensities in the cerebral white matter are unchanged and nonspecific but compatible with moderate chronic small vessel ischemic disease. A chronic lacunar infarct is again noted in the right caudate head. There is cerebral atrophy with unchanged ventriculomegaly which is again noted to be disproportionate to the size of the sulci with an acute callosal angle. Vascular: Calcified atherosclerosis at the skull base. No hyperdense vessel. Skull: No acute fracture or suspicious lesion. Sinuses/Orbits: Visualized paranasal sinuses and mastoid air cells are clear. Bilateral cataract extraction. Other: None. CT CERVICAL SPINE FINDINGS Alignment: Chronic mild reversal of the normal cervical lordosis and trace anterolisthesis of C7 on T1. Skull base and vertebrae: Unchanged chronic fracture through the right lateral aspect of the posterior C1  arch. No acute fracture or suspicious lesion. 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 Soft tissues and spinal canal: No prevertebral fluid or swelling. No visible canal hematoma. Disc levels: Moderately advanced disc degeneration and asymmetric left-sided facet arthrosis throughout much of the cervical spine. Suspected moderate spinal stenosis at C3-4. Moderate multilevel neural foraminal stenosis. Upper chest: The included lung apices are clear. Other: None. IMPRESSION: 1. No evidence of acute intracranial abnormality or acute cervical spine fracture. 2. Moderate chronic small vessel ischemic disease. 3. Unchanged ventriculomegaly with features which can be seen with normal pressure hydrocephalus in the appropriate clinical setting. Electronically Signed   By: Sebastian Ache M.D.   On: 09/06/2023 15:38   CT Cervical Spine Wo Contrast Result Date: 09/06/2023 CLINICAL DATA:  Head trauma, minor (Age >= 65y); Neck trauma (Age >= 65y). Fall. EXAM: CT HEAD WITHOUT CONTRAST CT  CERVICAL SPINE WITHOUT CONTRAST TECHNIQUE: Multidetector CT imaging of the head and cervical spine was performed following the standard protocol without intravenous contrast. Multiplanar CT image reconstructions of the cervical spine were also generated. RADIATION DOSE REDUCTION: This exam was performed according to the departmental dose-optimization program which includes automated exposure control, adjustment of the mA and/or kV according to patient size and/or use of iterative reconstruction technique. COMPARISON:  CT head 05/06/2023.  CT cervical spine 04/30/2023. FINDINGS: CT HEAD FINDINGS Brain: There is no evidence of an acute infarct, intracranial hemorrhage, mass, midline shift, or extra-axial fluid collection. Patchy hypodensities in the cerebral white matter are unchanged and nonspecific but compatible with moderate chronic small vessel ischemic disease. A chronic lacunar infarct is again noted in the right caudate head. There is  cerebral atrophy with unchanged ventriculomegaly which is again noted to be disproportionate to the size of the sulci with an acute callosal angle. Vascular: Calcified atherosclerosis at the skull base. No hyperdense vessel. Skull: No acute fracture or suspicious lesion. Sinuses/Orbits: Visualized paranasal sinuses and mastoid air cells are clear. Bilateral cataract extraction. Other: None. CT CERVICAL SPINE FINDINGS Alignment: Chronic mild reversal of the normal cervical lordosis and trace anterolisthesis of C7 on T1. Skull base and vertebrae: Unchanged chronic fracture through the right lateral aspect of the posterior C1 arch. No acute fracture or suspicious lesion. 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 Soft tissues and spinal canal: No prevertebral fluid or swelling. No visible canal hematoma. Disc levels: Moderately advanced disc degeneration and asymmetric left-sided facet arthrosis throughout much of the cervical spine. Suspected moderate spinal stenosis at C3-4. Moderate multilevel neural foraminal stenosis. Upper chest: The included lung apices are clear. Other: None. IMPRESSION: 1. No evidence of acute intracranial abnormality or acute cervical spine fracture. 2. Moderate chronic small vessel ischemic disease. 3. Unchanged ventriculomegaly with features which can be seen with normal pressure hydrocephalus in the appropriate clinical setting. Electronically Signed   By: Sebastian Ache M.D.   On: 09/06/2023 15:38        Scheduled Meds:  ALPRAZolam  1 mg Oral QHS   aspirin EC  81 mg Oral Q breakfast   donepezil  5 mg Oral Daily   DULoxetine  30 mg Oral Daily   enoxaparin (LOVENOX) injection  40 mg Subcutaneous Q24H   fluticasone furoate-vilanterol  1 puff Inhalation Daily   And   umeclidinium bromide  1 puff Inhalation Daily   furosemide  20 mg Oral Daily   levothyroxine  75 mcg Oral Q0600   nicotine  14 mg Transdermal Once   Continuous Infusions:  ciprofloxacin 400 mg (09/07/23 0415)    vancomycin       LOS: 1 day    Time spent: 55 minutes    Lailoni Baquera D Sherryll Burger, DO [facility]  If 7PM-7AM, please contact night-coverage [email or web address] 09/07/2023, 9:06 AM

## 2023-09-07 NOTE — Plan of Care (Signed)
  Problem: Acute Rehab OT Goals (only OT should resolve) Goal: Pt. Will Perform Grooming Flowsheets (Taken 09/07/2023 1000) Pt Will Perform Grooming:  with modified independence  sitting Goal: Pt. Will Perform Upper Body Dressing Flowsheets (Taken 09/07/2023 1000) Pt Will Perform Upper Body Dressing:  with contact guard assist  sitting Goal: Pt. Will Perform Lower Body Dressing Flowsheets (Taken 09/07/2023 1000) Pt Will Perform Lower Body Dressing:  with mod assist  sitting/lateral leans Goal: Pt. Will Transfer To Toilet Flowsheets (Taken 09/07/2023 1000) Pt Will Transfer to Toilet:  with min assist  stand pivot transfer Goal: Pt. Will Perform Toileting-Clothing Manipulation Flowsheets (Taken 09/07/2023 1000) Pt Will Perform Toileting - Clothing Manipulation and hygiene:  with min assist  sitting/lateral leans Goal: Pt/Caregiver Will Perform Home Exercise Program Flowsheets (Taken 09/07/2023 1000) Pt/caregiver will Perform Home Exercise Program:  Increased ROM  Increased strength  Both right and left upper extremity  Independently  Destine Ambroise OT, MOT

## 2023-09-08 DIAGNOSIS — R531 Weakness: Secondary | ICD-10-CM | POA: Diagnosis not present

## 2023-09-08 LAB — BASIC METABOLIC PANEL
Anion gap: 10 (ref 5–15)
BUN: 17 mg/dL (ref 8–23)
CO2: 26 mmol/L (ref 22–32)
Calcium: 8.8 mg/dL — ABNORMAL LOW (ref 8.9–10.3)
Chloride: 103 mmol/L (ref 98–111)
Creatinine, Ser: 1.28 mg/dL — ABNORMAL HIGH (ref 0.44–1.00)
GFR, Estimated: 43 mL/min — ABNORMAL LOW (ref >60)
Glucose, Bld: 110 mg/dL — ABNORMAL HIGH (ref 70–99)
Potassium: 3.6 mmol/L (ref 3.5–5.1)
Sodium: 139 mmol/L (ref 135–145)

## 2023-09-08 LAB — CBC
HCT: 39 % (ref 36.0–46.0)
Hemoglobin: 12.6 g/dL (ref 12.0–15.0)
MCH: 30.3 pg (ref 26.0–34.0)
MCHC: 32.3 g/dL (ref 30.0–36.0)
MCV: 93.8 fL (ref 80.0–100.0)
Platelets: 192 10*3/uL (ref 150–400)
RBC: 4.16 MIL/uL (ref 3.87–5.11)
RDW: 16.2 % — ABNORMAL HIGH (ref 11.5–15.5)
WBC: 13.1 10*3/uL — ABNORMAL HIGH (ref 4.0–10.5)
nRBC: 0 % (ref 0.0–0.2)

## 2023-09-08 LAB — GLUCOSE, CAPILLARY: Glucose-Capillary: 113 mg/dL — ABNORMAL HIGH (ref 70–99)

## 2023-09-08 LAB — URINE CULTURE: Culture: 60000 — AB

## 2023-09-08 LAB — MAGNESIUM: Magnesium: 2 mg/dL (ref 1.7–2.4)

## 2023-09-08 MED ORDER — CIPROFLOXACIN IN D5W 400 MG/200ML IV SOLN
400.0000 mg | INTRAVENOUS | Status: DC
  Administered 2023-09-09: 400 mg via INTRAVENOUS
  Filled 2023-09-08: qty 200

## 2023-09-08 NOTE — TOC Progression Note (Signed)
 Transition of Care [facility]) - Progression Note    Patient Details  Name: Amanda Armstrong MRN: [id number] Date of Birth: [birth year]  [facility] CM/SW Contact  Villa Herb, [place] Phone Number: 09/08/2023, 10:10 AM  Clinical Narrative:    CSW spoke with pts daughter who states they accept the bed at [facility]. Insurance [employer] Harvey has been approved. CSW updated Jill Side in admissions that pt will likely be medically stable for D/C over the weekend. TOC to follow.   Expected Discharge Plan: Skilled Nursing Facility Barriers to Discharge: Continued Medical Work up  Expected Discharge Plan and Services In-house Referral: Clinical Social Work Discharge Planning Services: CM Consult Post Acute Care Choice: Skilled Nursing Facility Living arrangements for the past 2 months: Single Family Home                                       Social Determinants of Health (SDOH) Interventions SDOH Screenings   Food Insecurity: No Food Insecurity (09/06/2023)  Housing: Low Risk  (09/06/2023)  Transportation Needs: No Transportation Needs (09/06/2023)  Utilities: Not At Risk (09/06/2023)  Depression (PHQ2-9): Low Risk  (08/23/2023)  Social Connections: Socially Integrated (09/06/2023)  Recent Concern: Social Connections - Moderately Isolated (08/08/2023)  Tobacco Use: High Risk (09/06/2023)    Readmission Risk Interventions    09/07/2023   11:42 AM 08/11/2023   12:07 PM 08/08/2023    2:54 PM  Readmission Risk Prevention Plan  Transportation Screening Complete Complete Complete  PCP or Specialist Appt within 5-7 Days  Complete Not Complete  Home Care Screening Complete  Complete  Medication Review (RN CM) Complete  Complete

## 2023-09-08 NOTE — Progress Notes (Signed)
 Occupational Therapy Treatment Patient Details Name: Amanda Armstrong MRN: [id number] DOB: [birth year] Today's Date: 09/08/2023   History of present illness Amanda Armstrong is a 78 y.o. female with medical history significant for chronic respiratory failure, COPD, CKD 3B, diabetes mellitus, hypertension, stroke.  Patient presented to the ED with complaints of generalized weakness over the past week.  She also reports urinary frequency over the past week, without dysuria.  At baseline she has poor oral intake and this is unchanged.  No vomiting no diarrhea.  She presented today with reports of a fall, with patient tripping and falling on her knees despite the assistance of her home aide.  She denies dizziness.  Family also requesting rehab placement due to patient's increasing weakness.   OT comments  Pt agreeable to OT treatment. Pt shows improved bed mobility and transfers today. Pt appeared to be more cognitively sound and engaged in conversation. Pt still required mod to max A for bed mobility and mod A for transfers, but was able to progress to min/mod A for sit to stand with practice. Pt left in the chair with chair alarm set and call bell within reach. Pt will benefit from continued OT in the hospital and recommended venue below to increase strength, balance, and endurance for safe ADL's.         If plan is discharge home, recommend the following:  A lot of help with walking and/or transfers;A lot of help with bathing/dressing/bathroom;Assistance with cooking/housework;Assist for transportation;Help with stairs or ramp for entrance   Equipment Recommendations  None recommended by OT           Precautions / Restrictions Precautions Precautions: Fall Recall of Precautions/Restrictions: Intact Restrictions Weight Bearing Restrictions Per Provider Order: No       Mobility Bed Mobility Overal bed mobility: Needs Assistance Bed Mobility: Supine to Sit     Supine to sit: Mod  assist, Max assist     General bed mobility comments: assist to pull to sit; flat bed    Transfers Overall transfer level: Needs assistance Equipment used: Rolling walker (2 wheels) Transfers: Sit to/from Stand, Bed to chair/wheelchair/BSC Sit to Stand: Mod assist, Min assist     Step pivot transfers: Mod assist     General transfer comment: x4 reps of sit to stand total. Mod A from bed. Mod progressing to min/modA for sit to stand from chair. Tactile and verbal cuing for technique.     Balance Overall balance assessment: Needs assistance Sitting-balance support: Bilateral upper extremity supported, Feet supported Sitting balance-Leahy Scale: Poor Sitting balance - Comments: poor to fair seated at EOB Postural control: Left lateral lean Standing balance support: Bilateral upper extremity supported, During functional activity, Reliant on assistive device for balance Standing balance-Leahy Scale: Poor Standing balance comment: using RW                            Extremity/Trunk Assessment Upper Extremity Assessment Upper Extremity Assessment: Generalized weakness   Lower Extremity Assessment Lower Extremity Assessment: Defer to PT evaluation                         Communication Communication Communication: No apparent difficulties   Cognition Arousal: Alert Behavior During Therapy: WFL for tasks assessed/performed Cognition: No family/caregiver present to determine baseline             OT - Cognition Comments: Improved ability to follow commands and engage  in conversation. Noted to be talking to her  husband on the phone at the start of the session.                 Following commands: Intact        Cueing   Cueing Techniques: Verbal cues, Tactile cues  Exercises                   Pertinent Vitals/ Pain       Pain Assessment Pain Assessment: No/denies pain                                                           Frequency  Min 2X/week        Progress Toward Goals  OT Goals(current goals can now be found in the care plan section)  Progress towards OT goals: Progressing toward goals  Acute Rehab OT Goals Patient Stated Goal: Improve function; open to rehab. OT Goal Formulation: With patient Time For Goal Achievement: 09/21/23 Potential to Achieve Goals: Good ADL Goals Pt Will Perform Grooming: with modified independence;sitting Pt Will Perform Upper Body Dressing: with contact guard assist;sitting Pt Will Perform Lower Body Dressing: with mod assist;sitting/lateral leans Pt Will Transfer to Toilet: with min assist;stand pivot transfer Pt Will Perform Toileting - Clothing Manipulation and hygiene: with min assist;sitting/lateral leans Pt/caregiver will Perform Home Exercise Program: Increased ROM;Increased strength;Both right and left upper extremity;Independently  Plan                                      End of Session Equipment Utilized During Treatment: Rolling walker (2 wheels);Gait belt  OT Visit Diagnosis: Unsteadiness on feet (R26.81);Other abnormalities of gait and mobility (R26.89);Muscle weakness (generalized) (M62.81)   Activity Tolerance Patient tolerated treatment well   Patient Left in chair;with call bell/phone within reach;with chair alarm set   Nurse Communication          Time: 1610-9604 OT Time Calculation (min): 19 min  Charges: OT General Charges $OT Visit: 1 Visit OT Treatments $Therapeutic Exercise: 8-22 mins  Gawain Crombie OT, MOT   Danie Chandler 09/08/2023, 11:48 AM

## 2023-09-08 NOTE — Plan of Care (Signed)

## 2023-09-08 NOTE — Progress Notes (Signed)
 Pt A x O x 2 (person and place). Pt didn't want to eat breakfast this morning but did eat all of her peaches.

## 2023-09-08 NOTE — Progress Notes (Signed)
 PROGRESS NOTE    Amanda Armstrong  ZOX:[id number] DOB: 03-25-1946 DOA: 09/06/2023 PCP: [name], MD   Brief Narrative:    Amanda Armstrong is a 78 y.o. female with medical history significant for chronic respiratory failure, COPD, CKD 3B, diabetes mellitus, hypertension, stroke. Patient presented to the ED with complaints of generalized weakness over the past week.  She also reports urinary frequency over the past week, without dysuria.  At baseline she has poor oral intake and this is unchanged.  No vomiting no diarrhea.  She presented today with reports of a fall, with patient tripping and falling on her knees despite the assistance of her home aide.  She denies dizziness. Family also requesting rehab placement due to patient's increasing weakness.  Patient was recently hospitalized 1/20 to 1/24 for septic shock, requiring Levophed, with urine cultures growing Citrobacter and Enterococcus.  She was discharged on ciprofloxacin and daptomycin.   SNF placement was offered, but family and patient refused.  Home health PT and RN was arranged for her on discharge. Patient and family now open to the idea of rehab. PT has evaluated her with recommendations for SNF/rehab.  Urine cultures are pending.  Assessment & Plan:   Principal Problem:   Generalized weakness Active Problems:   Acute cystitis   Fall at home, initial encounter   Hypothyroidism   Type 2 diabetes mellitus (HCC)   COPD (chronic obstructive pulmonary disease) (HCC)   Chronic hypoxic respiratory failure, on home oxygen therapy (HCC)  Assessment and Plan:  Generalized weakness Generalized weakness, fall. Recent hospitalization, SNF was recommended patient and family declined.  Now open to the idea of placement. -PT eval with recommendation for SNF/rehab which patient appears agreeable for   Acute cystitis Presenting with urinary frequency over the past week.  Denies dysuria.  Rules out for sepsis.  Recent  hospitalization with septic shock,due to CITROBACTER FREUNDII and ENTEROCOCCUS RAFFINOSUS CAUTI.  Initially treated with Zosyn, transition to ciprofloxacin and daptomycin. -Cipro started in ED, continue, add vancomycin -Add on urine cultures which are pending   Chronic hypoxic respiratory failure, on home oxygen therapy (HCC) Stable. On 2 L at baseline.   COPD (chronic obstructive pulmonary disease) (HCC) Stable. -Resume home regimen   Type 2 diabetes mellitus (HCC) A1 5.8.  Diet controlled. -Daily CBG  Obesity class I -BMI 32.48    DVT prophylaxis:Lovenox Code Status: Full Family Communication: None at bedside Disposition Plan: Continue IV antibiotics for UTI and plan for SNF placement Status is: Inpatient Remains inpatient appropriate because: Need for IV antibiotics   Consultants:  None  Procedures:  None  Antimicrobials:  Anti-infectives (From admission, onward)    Start     Dose/Rate Route Frequency Ordered Stop   09/07/23 2100  vancomycin (VANCOREADY) IVPB 750 mg/150 mL        750 mg 150 mL/hr over 60 Minutes Intravenous Every 24 hours 09/06/23 2056     09/07/23 0400  ciprofloxacin (CIPRO) IVPB 400 mg        400 mg 200 mL/hr over 60 Minutes Intravenous Every 12 hours 09/06/23 2134     09/06/23 2100  vancomycin (VANCOREADY) IVPB 1250 mg/250 mL        1,250 mg 166.7 mL/hr over 90 Minutes Intravenous  Once 09/06/23 2056 09/06/23 2327   09/06/23 1715  ciprofloxacin (CIPRO) IVPB 400 mg        400 mg 200 mL/hr over 60 Minutes Intravenous  Once 09/06/23 1700 09/06/23 1957   09/06/23 1700  cefTRIAXone (ROCEPHIN) 1 g in sodium chloride 0.9 % 100 mL IVPB        1 g 200 mL/hr over 30 Minutes Intravenous  Once 09/06/23 1651 09/06/23 1824      Subjective: Patient seen and evaluated today with no new acute complaints or concerns. No acute concerns or events noted overnight.  Appears sleepy this morning.  Objective: Vitals:   09/07/23 1608 09/07/23 2020 09/08/23  0402 09/08/23 0907  BP: 132/75 111/60 123/62   Pulse: 86 93 81   Resp: 20 18 16    Temp: 98.6 F (37 C) 98.6 F (37 C) 98 F (36.7 C)   TempSrc: Oral     SpO2: 95% 100% 95% 96%  Weight: 65.9 kg     Height: 4\' 10"  (1.473 m)       Intake/Output Summary (Last 24 hours) at 09/08/2023 0928 Last data filed at 09/08/2023 6578 Gross per 24 hour  Intake 792.61 ml  Output 200 ml  Net 592.61 ml   Filed Weights   09/06/23 1358 09/07/23 1608  Weight: 70.5 kg 65.9 kg    Examination:  General exam: Appears calm and comfortable  Respiratory system: Clear to auscultation. Respiratory effort normal. Cardiovascular system: S1 & S2 heard, RRR.  Gastrointestinal system: Abdomen is soft Central nervous system: Somnolent but arousable Extremities: No edema Skin: No significant lesions noted Psychiatry: Flat affect.    Data Reviewed: I have personally reviewed following labs and imaging studies  CBC: Recent Labs  Lab 09/06/23 1427 09/07/23 0456 09/08/23 0401  WBC 8.2 10.1 13.1*  NEUTROABS 5.8  --   --   HGB 12.5 11.7* 12.6  HCT 40.0 36.5 39.0  MCV 95.2 95.1 93.8  PLT 230 205 192   Basic Metabolic Panel: Recent Labs  Lab 09/06/23 1427 09/07/23 0456 09/08/23 0401  NA 140 140 139  K 4.0 3.6 3.6  CL 102 103 103  CO2 29 26 26   GLUCOSE 156* 128* 110*  BUN 24* 18 17  CREATININE 1.28* 1.11* 1.28*  CALCIUM 9.1 8.8* 8.8*  MG  --   --  2.0   GFR: Estimated Creatinine Clearance: 28.6 mL/min (A) (by C-G formula based on SCr of 1.28 mg/dL (H)). Liver Function Tests: No results for input(s): "AST", "ALT", "ALKPHOS", "BILITOT", "PROT", "ALBUMIN" in the last 168 hours. No results for input(s): "LIPASE", "AMYLASE" in the last 168 hours. No results for input(s): "AMMONIA" in the last 168 hours. Coagulation Profile: No results for input(s): "INR", "PROTIME" in the last 168 hours. Cardiac Enzymes: No results for input(s): "CKTOTAL", "CKMB", "CKMBINDEX", "TROPONINI" in the last 168  hours. BNP (last 3 results) No results for input(s): "PROBNP" in the last 8760 hours. HbA1C: No results for input(s): "HGBA1C" in the last 72 hours. CBG: Recent Labs  Lab 09/06/23 1556 09/07/23 0820 09/08/23 0724  GLUCAP 141* 95 113*   Lipid Profile: No results for input(s): "CHOL", "HDL", "LDLCALC", "TRIG", "CHOLHDL", "LDLDIRECT" in the last 72 hours. Thyroid Function Tests: No results for input(s): "TSH", "T4TOTAL", "FREET4", "T3FREE", "THYROIDAB" in the last 72 hours. Anemia Panel: No results for input(s): "VITAMINB12", "FOLATE", "FERRITIN", "TIBC", "IRON", "RETICCTPCT" in the last 72 hours. Sepsis Labs: No results for input(s): "PROCALCITON", "LATICACIDVEN" in the last 168 hours.  No results found for this or any previous visit (from the past 240 hours).       Radiology Studies: DG Hips Bilat W or Wo Pelvis 3-4 Views Result Date: 09/06/2023 CLINICAL DATA:  Fall. EXAM: DG HIP (WITH OR WITHOUT  PELVIS) 3-4V BILAT COMPARISON:  July 15, 2019. FINDINGS: There is no evidence of hip fracture or dislocation. Moderate osteophyte formation is noted involving the right hip. No significant joint space narrowing is noted. IMPRESSION: Moderate degenerative change of right hip. No acute abnormality seen. Electronically Signed   By: Lupita Raider M.D.   On: 09/06/2023 15:56   DG Knee Complete 4 Views Left Result Date: 09/06/2023 CLINICAL DATA:  Left knee pain after fall. EXAM: LEFT KNEE - COMPLETE 4+ VIEW COMPARISON:  April 03, 2016. FINDINGS: Status post left total knee arthroplasty. Femoral and tibial components are well situated. No fracture or dislocation is noted. IMPRESSION: No acute abnormality seen. Electronically Signed   By: Lupita Raider M.D.   On: 09/06/2023 15:52   DG Knee Complete 4 Views Right Result Date: 09/06/2023 CLINICAL DATA:  Right knee pain after fall. EXAM: RIGHT KNEE - COMPLETE 4+ VIEW COMPARISON:  April 03, 2016. FINDINGS: Status post right total knee  arthroplasty. Femoral and tibial components are well situated. No fracture or dislocation. IMPRESSION: No acute abnormality seen. Electronically Signed   By: Lupita Raider M.D.   On: 09/06/2023 15:50   DG Chest Portable 1 View Result Date: 09/06/2023 CLINICAL DATA:  Weakness after fall. EXAM: PORTABLE CHEST 1 VIEW COMPARISON:  August 07, 2023. FINDINGS: The heart size and mediastinal contours are within normal limits. Both lungs are clear. The visualized skeletal structures are unremarkable. IMPRESSION: No active disease. Electronically Signed   By: Lupita Raider M.D.   On: 09/06/2023 15:48   CT HEAD WO CONTRAST Result Date: 09/06/2023 CLINICAL DATA:  Head trauma, minor (Age >= 65y); Neck trauma (Age >= 65y). Fall. EXAM: CT HEAD WITHOUT CONTRAST CT CERVICAL SPINE WITHOUT CONTRAST TECHNIQUE: Multidetector CT imaging of the head and cervical spine was performed following the standard protocol without intravenous contrast. Multiplanar CT image reconstructions of the cervical spine were also generated. RADIATION DOSE REDUCTION: This exam was performed according to the departmental dose-optimization program which includes automated exposure control, adjustment of the mA and/or kV according to patient size and/or use of iterative reconstruction technique. COMPARISON:  CT head 05/06/2023.  CT cervical spine 04/30/2023. FINDINGS: CT HEAD FINDINGS Brain: There is no evidence of an acute infarct, intracranial hemorrhage, mass, midline shift, or extra-axial fluid collection. Patchy hypodensities in the cerebral white matter are unchanged and nonspecific but compatible with moderate chronic small vessel ischemic disease. A chronic lacunar infarct is again noted in the right caudate head. There is cerebral atrophy with unchanged ventriculomegaly which is again noted to be disproportionate to the size of the sulci with an acute callosal angle. Vascular: Calcified atherosclerosis at the skull base. No hyperdense vessel.  Skull: No acute fracture or suspicious lesion. Sinuses/Orbits: Visualized paranasal sinuses and mastoid air cells are clear. Bilateral cataract extraction. Other: None. CT CERVICAL SPINE FINDINGS Alignment: Chronic mild reversal of the normal cervical lordosis and trace anterolisthesis of C7 on T1. Skull base and vertebrae: Unchanged chronic fracture through the right lateral aspect of the posterior C1 arch. No acute fracture or suspicious lesion. 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 Soft tissues and spinal canal: No prevertebral fluid or swelling. No visible canal hematoma. Disc levels: Moderately advanced disc degeneration and asymmetric left-sided facet arthrosis throughout much of the cervical spine. Suspected moderate spinal stenosis at C3-4. Moderate multilevel neural foraminal stenosis. Upper chest: The included lung apices are clear. Other: None. IMPRESSION: 1. No evidence of acute intracranial abnormality or acute cervical spine fracture.  2. Moderate chronic small vessel ischemic disease. 3. Unchanged ventriculomegaly with features which can be seen with normal pressure hydrocephalus in the appropriate clinical setting. Electronically Signed   By: Sebastian Ache M.D.   On: 09/06/2023 15:38   CT Cervical Spine Wo Contrast Result Date: 09/06/2023 CLINICAL DATA:  Head trauma, minor (Age >= 65y); Neck trauma (Age >= 65y). Fall. EXAM: CT HEAD WITHOUT CONTRAST CT CERVICAL SPINE WITHOUT CONTRAST TECHNIQUE: Multidetector CT imaging of the head and cervical spine was performed following the standard protocol without intravenous contrast. Multiplanar CT image reconstructions of the cervical spine were also generated. RADIATION DOSE REDUCTION: This exam was performed according to the departmental dose-optimization program which includes automated exposure control, adjustment of the mA and/or kV according to patient size and/or use of iterative reconstruction technique. COMPARISON:  CT head 05/06/2023.  CT  cervical spine 04/30/2023. FINDINGS: CT HEAD FINDINGS Brain: There is no evidence of an acute infarct, intracranial hemorrhage, mass, midline shift, or extra-axial fluid collection. Patchy hypodensities in the cerebral white matter are unchanged and nonspecific but compatible with moderate chronic small vessel ischemic disease. A chronic lacunar infarct is again noted in the right caudate head. There is cerebral atrophy with unchanged ventriculomegaly which is again noted to be disproportionate to the size of the sulci with an acute callosal angle. Vascular: Calcified atherosclerosis at the skull base. No hyperdense vessel. Skull: No acute fracture or suspicious lesion. Sinuses/Orbits: Visualized paranasal sinuses and mastoid air cells are clear. Bilateral cataract extraction. Other: None. CT CERVICAL SPINE FINDINGS Alignment: Chronic mild reversal of the normal cervical lordosis and trace anterolisthesis of C7 on T1. Skull base and vertebrae: Unchanged chronic fracture through the right lateral aspect of the posterior C1 arch. No acute fracture or suspicious lesion. 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 Soft tissues and spinal canal: No prevertebral fluid or swelling. No visible canal hematoma. Disc levels: Moderately advanced disc degeneration and asymmetric left-sided facet arthrosis throughout much of the cervical spine. Suspected moderate spinal stenosis at C3-4. Moderate multilevel neural foraminal stenosis. Upper chest: The included lung apices are clear. Other: None. IMPRESSION: 1. No evidence of acute intracranial abnormality or acute cervical spine fracture. 2. Moderate chronic small vessel ischemic disease. 3. Unchanged ventriculomegaly with features which can be seen with normal pressure hydrocephalus in the appropriate clinical setting. Electronically Signed   By: Sebastian Ache M.D.   On: 09/06/2023 15:38        Scheduled Meds:  ALPRAZolam  1 mg Oral QHS   aspirin EC  81 mg Oral Q  breakfast   donepezil  5 mg Oral Daily   DULoxetine  30 mg Oral Daily   enoxaparin (LOVENOX) injection  40 mg Subcutaneous Q24H   fluticasone furoate-vilanterol  1 puff Inhalation Daily   And   umeclidinium bromide  1 puff Inhalation Daily   furosemide  20 mg Oral Daily   levothyroxine  75 mcg Oral Q0600   nystatin   Topical BID   Continuous Infusions:  ciprofloxacin 400 mg (09/08/23 0456)   vancomycin 750 mg (09/07/23 2130)     LOS: 2 days    Time spent: 55 minutes    Taurean Ju D Sherryll Burger, DO [facility]  If 7PM-7AM, please contact night-coverage [email or web address] 09/08/2023, 9:28 AM

## 2023-09-08 NOTE — Care Management Important Message (Signed)
 Important Message  Patient Details  Name: Amanda Armstrong MRN: [id number] Date of Birth: 1946-01-31   Important Message Given:  Yes - Medicare IM     Corey Harold 09/08/2023, 3:47 PM

## 2023-09-09 DIAGNOSIS — G319 Degenerative disease of nervous system, unspecified: Secondary | ICD-10-CM | POA: Diagnosis not present

## 2023-09-09 DIAGNOSIS — R6889 Other general symptoms and signs: Secondary | ICD-10-CM | POA: Diagnosis not present

## 2023-09-09 DIAGNOSIS — R278 Other lack of coordination: Secondary | ICD-10-CM | POA: Diagnosis not present

## 2023-09-09 DIAGNOSIS — M6281 Muscle weakness (generalized): Secondary | ICD-10-CM | POA: Diagnosis not present

## 2023-09-09 DIAGNOSIS — R2689 Other abnormalities of gait and mobility: Secondary | ICD-10-CM | POA: Diagnosis not present

## 2023-09-09 DIAGNOSIS — M1990 Unspecified osteoarthritis, unspecified site: Secondary | ICD-10-CM | POA: Diagnosis not present

## 2023-09-09 DIAGNOSIS — E039 Hypothyroidism, unspecified: Secondary | ICD-10-CM | POA: Diagnosis not present

## 2023-09-09 DIAGNOSIS — U071 COVID-19: Secondary | ICD-10-CM | POA: Diagnosis not present

## 2023-09-09 DIAGNOSIS — R5381 Other malaise: Secondary | ICD-10-CM | POA: Diagnosis not present

## 2023-09-09 DIAGNOSIS — J9611 Chronic respiratory failure with hypoxia: Secondary | ICD-10-CM | POA: Diagnosis not present

## 2023-09-09 DIAGNOSIS — N3000 Acute cystitis without hematuria: Secondary | ICD-10-CM | POA: Diagnosis not present

## 2023-09-09 DIAGNOSIS — R531 Weakness: Secondary | ICD-10-CM | POA: Diagnosis not present

## 2023-09-09 DIAGNOSIS — Z720 Tobacco use: Secondary | ICD-10-CM | POA: Diagnosis not present

## 2023-09-09 DIAGNOSIS — J449 Chronic obstructive pulmonary disease, unspecified: Secondary | ICD-10-CM | POA: Diagnosis not present

## 2023-09-09 DIAGNOSIS — J9601 Acute respiratory failure with hypoxia: Secondary | ICD-10-CM | POA: Diagnosis not present

## 2023-09-09 DIAGNOSIS — D494 Neoplasm of unspecified behavior of bladder: Secondary | ICD-10-CM | POA: Diagnosis not present

## 2023-09-09 DIAGNOSIS — F802 Mixed receptive-expressive language disorder: Secondary | ICD-10-CM | POA: Diagnosis not present

## 2023-09-09 DIAGNOSIS — R1312 Dysphagia, oropharyngeal phase: Secondary | ICD-10-CM | POA: Diagnosis not present

## 2023-09-09 DIAGNOSIS — E119 Type 2 diabetes mellitus without complications: Secondary | ICD-10-CM | POA: Diagnosis not present

## 2023-09-09 DIAGNOSIS — J99 Respiratory disorders in diseases classified elsewhere: Secondary | ICD-10-CM | POA: Diagnosis not present

## 2023-09-09 DIAGNOSIS — Z7401 Bed confinement status: Secondary | ICD-10-CM | POA: Diagnosis not present

## 2023-09-09 LAB — BASIC METABOLIC PANEL
Anion gap: 8 (ref 5–15)
BUN: 18 mg/dL (ref 8–23)
CO2: 25 mmol/L (ref 22–32)
Calcium: 8.8 mg/dL — ABNORMAL LOW (ref 8.9–10.3)
Chloride: 106 mmol/L (ref 98–111)
Creatinine, Ser: 1.16 mg/dL — ABNORMAL HIGH (ref 0.44–1.00)
GFR, Estimated: 48 mL/min — ABNORMAL LOW (ref >60)
Glucose, Bld: 90 mg/dL (ref 70–99)
Potassium: 3.5 mmol/L (ref 3.5–5.1)
Sodium: 139 mmol/L (ref 135–145)

## 2023-09-09 LAB — GLUCOSE, CAPILLARY: Glucose-Capillary: 96 mg/dL (ref 70–99)

## 2023-09-09 LAB — MAGNESIUM: Magnesium: 2.1 mg/dL (ref 1.7–2.4)

## 2023-09-09 LAB — CBC
HCT: 35.8 % — ABNORMAL LOW (ref 36.0–46.0)
Hemoglobin: 11.6 g/dL — ABNORMAL LOW (ref 12.0–15.0)
MCH: 30.4 pg (ref 26.0–34.0)
MCHC: 32.4 g/dL (ref 30.0–36.0)
MCV: 93.7 fL (ref 80.0–100.0)
Platelets: 193 10*3/uL (ref 150–400)
RBC: 3.82 MIL/uL — ABNORMAL LOW (ref 3.87–5.11)
RDW: 16.2 % — ABNORMAL HIGH (ref 11.5–15.5)
WBC: 9.7 10*3/uL (ref 4.0–10.5)
nRBC: 0 % (ref 0.0–0.2)

## 2023-09-09 MED ORDER — FLUCONAZOLE 100 MG PO TABS
200.0000 mg | ORAL_TABLET | Freq: Every day | ORAL | Status: DC
  Administered 2023-09-09: 200 mg via ORAL
  Filled 2023-09-09: qty 2

## 2023-09-09 MED ORDER — NYSTATIN 100000 UNIT/GM EX POWD: Freq: Two times a day (BID) | CUTANEOUS | 0 refills | Status: AC

## 2023-09-09 MED ORDER — HYDROCODONE-ACETAMINOPHEN 10-325 MG PO TABS: 1.0000 | ORAL_TABLET | ORAL | 0 refills | Status: AC | PRN

## 2023-09-09 MED ORDER — FLUCONAZOLE 200 MG PO TABS: 200.0000 mg | ORAL_TABLET | Freq: Every day | ORAL | 0 refills | Status: AC

## 2023-09-09 MED ORDER — ALPRAZOLAM 1 MG PO TABS: 1.0000 mg | ORAL_TABLET | Freq: Every day | ORAL | 0 refills | Status: AC

## 2023-09-09 NOTE — Progress Notes (Signed)
 Attempted to call [facility] rehab 3 times for pt report. No response.

## 2023-09-09 NOTE — TOC Transition Note (Signed)
 Transition of Care [facility]) - Discharge Note   Patient Details  Name: Amanda Armstrong MRN: [id number] Date of Birth: 07/23/1945  [facility] CM/SW Contact:  Catalina Gravel, LCSW Phone Number: 09/09/2023, 12:54 PM   Clinical Narrative:    CSW reached out to [facility] following [place] chat from MD. Pt was ready for DC.  [facility] can accept pt-will need DC summary soon. MD was able to complete DC summary and CSW sent via Epic. RR info provided to nurse via [facility], CSW also spoke to daughter and advised pt able to DC to her rehab facility and transportation will be arranged. No further TOC needs at this time.      Barriers to Discharge: No Barriers Identified   Patient Goals and CMS Choice Patient states their goals for this hospitalization and ongoing recovery are:: go to SNF CMS Medicare.gov Compare Post Acute Care list provided to:: Patient Represenative (must comment) Choice offered to / list presented to : Adult Children [place] ownership interest in [facility].provided to:: Adult Children    Discharge Placement                       Discharge Plan and Services Additional resources added to the After Visit Summary for   In-house Referral: Clinical Social Work Discharge Planning Services: CM Consult Post Acute Care Choice: Skilled Nursing Facility                               Social Drivers of Health (SDOH) Interventions SDOH Screenings   Food Insecurity: No Food Insecurity (09/06/2023)  Housing: Low Risk  (09/06/2023)  Transportation Needs: No Transportation Needs (09/06/2023)  Utilities: Not At Risk (09/06/2023)  Depression (PHQ2-9): Low Risk  (08/23/2023)  Social Connections: Socially Integrated (09/06/2023)  Recent Concern: Social Connections - Moderately Isolated (08/08/2023)  Tobacco Use: High Risk (09/06/2023)     Readmission Risk Interventions    09/07/2023   11:42 AM 08/11/2023   12:07 PM 08/08/2023    2:54 PM  Readmission  Risk Prevention Plan  Transportation Screening Complete Complete Complete  PCP or Specialist Appt within 5-7 Days  Complete Not Complete  Home Care Screening Complete  Complete  Medication Review (RN CM) Complete  Complete

## 2023-09-09 NOTE — Discharge Summary (Signed)
 Physician Discharge Summary  Amanda Armstrong:[id number] DOB: 02-Sep-1945 DOA: 09/06/2023  PCP: [name], MD  Admit date: 09/06/2023  Discharge date: 09/09/2023  Admitted From:Home  Disposition:  SNF  Recommendations for Outpatient Follow-up:  Follow up with PCP in 1-2 weeks Continue on Diflucan as prescribed for Candida UTI and finish course of treatment Continue other home medications as prior  Home Health: None  Equipment/Devices: 2 L nasal cannula oxygen  Discharge Condition:Stable  CODE STATUS: Full  Diet recommendation: Heart Healthy/carb modified  Brief/Interim Summary:  Amanda Armstrong is a 78 y.o. female with medical history significant for chronic respiratory failure, COPD, CKD 3B, diabetes mellitus, hypertension, stroke. Patient presented to the ED with complaints of generalized weakness over the past week.  She also reports urinary frequency over the past week, without dysuria.  At baseline she has poor oral intake and this is unchanged.  No vomiting no diarrhea.  She presented today with reports of a fall, with patient tripping and falling on her knees despite the assistance of her home aide.  She denies dizziness. Family also requesting rehab placement due to patient's increasing weakness.   Patient was recently hospitalized 1/20 to 1/24 for septic shock, requiring Levophed, with urine cultures growing Citrobacter and Enterococcus.  She was discharged on ciprofloxacin and daptomycin.   SNF placement was offered, but family and patient refused.  Home health PT and RN was arranged for her on discharge. Patient and family now open to the idea of rehab. PT has evaluated her with recommendations for SNF/rehab.  She was initially started on IV antibiotics due to concern for recurrence of her UTI.  Cultures came back with yeast and therefore she was started on Diflucan.  She will continue on the Diflucan as ordered for total 2-week course.  She has been seen by PT  with recommendations for rehab placement.  She is currently agreeable.  Discharge Diagnoses:  Principal Problem:   Generalized weakness Active Problems:   Acute cystitis   Fall at home, initial encounter   Hypothyroidism   Type 2 diabetes mellitus (HCC)   COPD (chronic obstructive pulmonary disease) (HCC)   Chronic hypoxic respiratory failure, on home oxygen therapy (HCC)  Principal discharge diagnosis: Generalized weakness in the setting of recurrent acute cystitis along with debility/deconditioning.  Discharge Instructions  Discharge Instructions     Diet - low sodium heart healthy   Complete by: As directed    Increase activity slowly   Complete by: As directed       Allergies as of 09/09/2023       Reactions   Iron Other (See Comments)   "upset stomach"   Oxycodone Other (See Comments)   "Caused her to feel like she is out of her body"   Sudafed [pseudoephedrine Hcl] Other (See Comments)   "funny feeling"   Chlorhexidine Gluconate Rash        Medication List     STOP taking these medications    ciprofloxacin 500 MG tablet Commonly known as: CIPRO   DAPTOmycin 500 MG injection Commonly known as: CUBICIN   doxycycline 100 MG capsule Commonly known as: VIBRAMYCIN       TAKE these medications    Accu-Chek Guide test strip Generic drug: glucose blood USE TEST STRIP TO CHECK GLUCOSE TWICE DAILY BEFORE BREAKFAST AND  BEFORE BEDTIME   Accu-Chek Softclix Lancets lancets USE  TO CHECK GLUCOSE TWICE DAILY   acetaminophen 500 MG tablet Commonly known as: TYLENOL Take 1,000 mg by  mouth every 6 (six) hours as needed for mild pain.   alendronate 70 MG tablet Commonly known as: FOSAMAX Take 70 mg by mouth every Sunday.   ALPRAZolam 1 MG tablet Commonly known as: XANAX Take 1 tablet (1 mg total) by mouth at bedtime.   aspirin EC 81 MG tablet Take 1 tablet (81 mg total) by mouth daily with breakfast.   atorvastatin 20 MG tablet Commonly known as:  LIPITOR Take 20 mg by mouth daily at 12 noon.   blood glucose meter kit and supplies Dispense based on patient and insurance preference. Use up to four times daily as directed. (FOR ICD-10 E10.9, E11.9).   donepezil 5 MG tablet Commonly known as: ARICEPT Take 5 mg by mouth daily.   DULoxetine 30 MG capsule Commonly known as: CYMBALTA Take 30 mg by mouth daily.   fluconazole 200 MG tablet Commonly known as: DIFLUCAN Take 1 tablet (200 mg total) by mouth daily for 14 days. Start taking on: September 10, 2023 What changed:  medication strength how much to take   furosemide 20 MG tablet Commonly known as: LASIX Take 20 mg by mouth daily.   HYDROcodone-acetaminophen 10-325 MG tablet Commonly known as: NORCO Take 1 tablet by mouth every 4 (four) hours as needed for moderate pain (pain score 4-6).   levothyroxine 75 MCG tablet Commonly known as: SYNTHROID Take 1 tablet (75 mcg total) by mouth daily before breakfast.   MUSCLE RUB EX Apply 1 Application topically daily as needed (pain).   nicotine 14 mg/24hr patch Commonly known as: NICODERM CQ - dosed in mg/24 hours Place 1 patch (14 mg total) onto the skin daily.   nystatin powder Commonly known as: MYCOSTATIN/NYSTOP Apply topically 2 (two) times daily.   OXYGEN Inhale 1.5-2 L/min into the lungs daily as needed (O2 sats below 90 and at bedtime).   Trelegy Ellipta 200-62.5-25 MCG/ACT Aepb Generic drug: Fluticasone-Umeclidin-Vilant Inhale 1 puff into the lungs daily.   Vitamin D3 50 MCG (2000 UT) capsule Take 2,000 Units by mouth daily.        Follow-up Information     [name], MD. Schedule an appointment as soon as possible for a visit in 1 week(s).   Specialty: Internal Medicine Contact information: [address] [place] [address] [phone]                Allergies  Allergen Reactions   Iron Other (See Comments)    "upset stomach"   Oxycodone Other (See Comments)    "Caused  her to feel like she is out of her body"   Sudafed [Pseudoephedrine Hcl] Other (See Comments)    "funny feeling"   Chlorhexidine Gluconate Rash    Consultations: None   Procedures/Studies: DG Hips Bilat W or Wo Pelvis 3-4 Views Result Date: 09/06/2023 CLINICAL DATA:  Fall. EXAM: DG HIP (WITH OR WITHOUT PELVIS) 3-4V BILAT COMPARISON:  July 15, 2019. FINDINGS: There is no evidence of hip fracture or dislocation. Moderate osteophyte formation is noted involving the right hip. No significant joint space narrowing is noted. IMPRESSION: Moderate degenerative change of right hip. No acute abnormality seen. Electronically Signed   By: Lupita Raider M.D.   On: 09/06/2023 15:56   DG Knee Complete 4 Views Left Result Date: 09/06/2023 CLINICAL DATA:  Left knee pain after fall. EXAM: LEFT KNEE - COMPLETE 4+ VIEW COMPARISON:  April 03, 2016. FINDINGS: Status post left total knee arthroplasty. Femoral and tibial components are well situated. No fracture or dislocation  is noted. IMPRESSION: No acute abnormality seen. Electronically Signed   By: Lupita Raider M.D.   On: 09/06/2023 15:52   DG Knee Complete 4 Views Right Result Date: 09/06/2023 CLINICAL DATA:  Right knee pain after fall. EXAM: RIGHT KNEE - COMPLETE 4+ VIEW COMPARISON:  April 03, 2016. FINDINGS: Status post right total knee arthroplasty. Femoral and tibial components are well situated. No fracture or dislocation. IMPRESSION: No acute abnormality seen. Electronically Signed   By: Lupita Raider M.D.   On: 09/06/2023 15:50   DG Chest Portable 1 View Result Date: 09/06/2023 CLINICAL DATA:  Weakness after fall. EXAM: PORTABLE CHEST 1 VIEW COMPARISON:  August 07, 2023. FINDINGS: The heart size and mediastinal contours are within normal limits. Both lungs are clear. The visualized skeletal structures are unremarkable. IMPRESSION: No active disease. Electronically Signed   By: Lupita Raider M.D.   On: 09/06/2023 15:48   CT HEAD WO  CONTRAST Result Date: 09/06/2023 CLINICAL DATA:  Head trauma, minor (Age >= 65y); Neck trauma (Age >= 65y). Fall. EXAM: CT HEAD WITHOUT CONTRAST CT CERVICAL SPINE WITHOUT CONTRAST TECHNIQUE: Multidetector CT imaging of the head and cervical spine was performed following the standard protocol without intravenous contrast. Multiplanar CT image reconstructions of the cervical spine were also generated. RADIATION DOSE REDUCTION: This exam was performed according to the departmental dose-optimization program which includes automated exposure control, adjustment of the mA and/or kV according to patient size and/or use of iterative reconstruction technique. COMPARISON:  CT head 05/06/2023.  CT cervical spine 04/30/2023. FINDINGS: CT HEAD FINDINGS Brain: There is no evidence of an acute infarct, intracranial hemorrhage, mass, midline shift, or extra-axial fluid collection. Patchy hypodensities in the cerebral white matter are unchanged and nonspecific but compatible with moderate chronic small vessel ischemic disease. A chronic lacunar infarct is again noted in the right caudate head. There is cerebral atrophy with unchanged ventriculomegaly which is again noted to be disproportionate to the size of the sulci with an acute callosal angle. Vascular: Calcified atherosclerosis at the skull base. No hyperdense vessel. Skull: No acute fracture or suspicious lesion. Sinuses/Orbits: Visualized paranasal sinuses and mastoid air cells are clear. Bilateral cataract extraction. Other: None. CT CERVICAL SPINE FINDINGS Alignment: Chronic mild reversal of the normal cervical lordosis and trace anterolisthesis of C7 on T1. Skull base and vertebrae: Unchanged chronic fracture through the right lateral aspect of the posterior C1 arch. No acute fracture or suspicious lesion. 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 Soft tissues and spinal canal: No prevertebral fluid or swelling. No visible canal hematoma. Disc levels: Moderately  advanced disc degeneration and asymmetric left-sided facet arthrosis throughout much of the cervical spine. Suspected moderate spinal stenosis at C3-4. Moderate multilevel neural foraminal stenosis. Upper chest: The included lung apices are clear. Other: None. IMPRESSION: 1. No evidence of acute intracranial abnormality or acute cervical spine fracture. 2. Moderate chronic small vessel ischemic disease. 3. Unchanged ventriculomegaly with features which can be seen with normal pressure hydrocephalus in the appropriate clinical setting. Electronically Signed   By: Sebastian Ache M.D.   On: 09/06/2023 15:38   CT Cervical Spine Wo Contrast Result Date: 09/06/2023 CLINICAL DATA:  Head trauma, minor (Age >= 65y); Neck trauma (Age >= 65y). Fall. EXAM: CT HEAD WITHOUT CONTRAST CT CERVICAL SPINE WITHOUT CONTRAST TECHNIQUE: Multidetector CT imaging of the head and cervical spine was performed following the standard protocol without intravenous contrast. Multiplanar CT image reconstructions of the cervical spine were also generated. RADIATION DOSE REDUCTION: This exam  was performed according to the departmental dose-optimization program which includes automated exposure control, adjustment of the mA and/or kV according to patient size and/or use of iterative reconstruction technique. COMPARISON:  CT head 05/06/2023.  CT cervical spine 04/30/2023. FINDINGS: CT HEAD FINDINGS Brain: There is no evidence of an acute infarct, intracranial hemorrhage, mass, midline shift, or extra-axial fluid collection. Patchy hypodensities in the cerebral white matter are unchanged and nonspecific but compatible with moderate chronic small vessel ischemic disease. A chronic lacunar infarct is again noted in the right caudate head. There is cerebral atrophy with unchanged ventriculomegaly which is again noted to be disproportionate to the size of the sulci with an acute callosal angle. Vascular: Calcified atherosclerosis at the skull base. No  hyperdense vessel. Skull: No acute fracture or suspicious lesion. Sinuses/Orbits: Visualized paranasal sinuses and mastoid air cells are clear. Bilateral cataract extraction. Other: None. CT CERVICAL SPINE FINDINGS Alignment: Chronic mild reversal of the normal cervical lordosis and trace anterolisthesis of C7 on T1. Skull base and vertebrae: Unchanged chronic fracture through the right lateral aspect of the posterior C1 arch. No acute fracture or suspicious lesion. 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 Soft tissues and spinal canal: No prevertebral fluid or swelling. No visible canal hematoma. Disc levels: Moderately advanced disc degeneration and asymmetric left-sided facet arthrosis throughout much of the cervical spine. Suspected moderate spinal stenosis at C3-4. Moderate multilevel neural foraminal stenosis. Upper chest: The included lung apices are clear. Other: None. IMPRESSION: 1. No evidence of acute intracranial abnormality or acute cervical spine fracture. 2. Moderate chronic small vessel ischemic disease. 3. Unchanged ventriculomegaly with features which can be seen with normal pressure hydrocephalus in the appropriate clinical setting. Electronically Signed   By: Sebastian Ache M.D.   On: 09/06/2023 15:38   [place] EKG SITE RITE Result Date: 08/10/2023 If Site Rite image not attached, placement could not be confirmed due to current cardiac rhythm.    Discharge Exam: Vitals:   09/09/23 0541 09/09/23 0921  BP: (!) 101/53   Pulse: 80   Resp: (!) 24   Temp: (!) 97.2 F (36.2 C)   SpO2: 93% 90%   Vitals:   09/08/23 1343 09/08/23 2022 09/09/23 0541 09/09/23 0921  BP: (!) 109/59 107/63 (!) 101/53   Pulse: 82 92 80   Resp: 18 18 (!) 24   Temp: 98.6 F (37 C) 98 F (36.7 C) (!) 97.2 F (36.2 C)   TempSrc: Oral     SpO2: 95% 94% 93% 90%  Weight:      Height:        General: Pt is alert, awake, not in acute distress Cardiovascular: RRR, S1/S2 +, no rubs, no gallops Respiratory: CTA  bilaterally, no wheezing, no rhonchi Abdominal: Soft, NT, ND, bowel sounds + Extremities: no edema, no cyanosis    The results of significant diagnostics from this hospitalization (including imaging, microbiology, ancillary and laboratory) are listed below for reference.     Microbiology: Recent Results (from the past 240 hours)  Urine Culture     Status: Abnormal   Collection Time: 09/06/23  8:26 PM   Specimen: Urine, Catheterized  Result Value Ref Range Status   Specimen Description   Final    URINE, CATHETERIZED Performed at [facility], [address], [place], [place] [address]    Special Requests   Final    NONE Performed at [facility], [address], [place], [place] [address]    Culture 60,000 COLONIES/mL YEAST (A)  Final  Report Status 09/08/2023 FINAL  Final     Labs: BNP (last 3 results) Recent Labs    05/06/23 0533  BNP 69.0   Basic Metabolic Panel: Recent Labs  Lab 09/06/23 1427 09/07/23 0456 09/08/23 0401 09/09/23 0317  NA 140 140 139 139  K 4.0 3.6 3.6 3.5  CL 102 103 103 106  CO2 29 26 26 25   GLUCOSE 156* 128* 110* 90  BUN 24* 18 17 18   CREATININE 1.28* 1.11* 1.28* 1.16*  CALCIUM 9.1 8.8* 8.8* 8.8*  MG  --   --  2.0 2.1   Liver Function Tests: No results for input(s): "AST", "ALT", "ALKPHOS", "BILITOT", "PROT", "ALBUMIN" in the last 168 hours. No results for input(s): "LIPASE", "AMYLASE" in the last 168 hours. No results for input(s): "AMMONIA" in the last 168 hours. CBC: Recent Labs  Lab 09/06/23 1427 09/07/23 0456 09/08/23 0401 09/09/23 0317  WBC 8.2 10.1 13.1* 9.7  NEUTROABS 5.8  --   --   --   HGB 12.5 11.7* 12.6 11.6*  HCT 40.0 36.5 39.0 35.8*  MCV 95.2 95.1 93.8 93.7  PLT 230 205 192 193   Cardiac Enzymes: No results for input(s): "CKTOTAL", "CKMB", "CKMBINDEX", "TROPONINI" in the last 168 hours. BNP: Invalid input(s): "POCBNP" CBG: Recent Labs  Lab 09/06/23 1556 09/07/23 0820 09/08/23 0724 09/09/23 0750  GLUCAP  141* 95 113* 96   D-Dimer No results for input(s): "DDIMER" in the last 72 hours. Hgb A1c No results for input(s): "HGBA1C" in the last 72 hours. Lipid Profile No results for input(s): "CHOL", "HDL", "LDLCALC", "TRIG", "CHOLHDL", "LDLDIRECT" in the last 72 hours. Thyroid function studies No results for input(s): "TSH", "T4TOTAL", "T3FREE", "THYROIDAB" in the last 72 hours.  Invalid input(s): "FREET3" Anemia work up No results for input(s): "VITAMINB12", "FOLATE", "FERRITIN", "TIBC", "IRON", "RETICCTPCT" in the last 72 hours. Urinalysis    Component Value Date/Time   COLORURINE AMBER (A) 09/06/2023 1519   APPEARANCEUR TURBID (A) 09/06/2023 1519   LABSPEC 1.010 09/06/2023 1519   PHURINE 5.0 09/06/2023 1519   GLUCOSEU NEGATIVE 09/06/2023 1519   HGBUR LARGE (A) 09/06/2023 1519   BILIRUBINUR NEGATIVE 09/06/2023 1519   KETONESUR NEGATIVE 09/06/2023 1519   PROTEINUR 30 (A) 09/06/2023 1519   UROBILINOGEN 0.2 08/27/2013 1130   NITRITE NEGATIVE 09/06/2023 1519   LEUKOCYTESUR LARGE (A) 09/06/2023 1519   Sepsis Labs Recent Labs  Lab 09/06/23 1427 09/07/23 0456 09/08/23 0401 09/09/23 0317  WBC 8.2 10.1 13.1* 9.7   Microbiology Recent Results (from the past 240 hours)  Urine Culture     Status: Abnormal   Collection Time: 09/06/23  8:26 PM   Specimen: Urine, Catheterized  Result Value Ref Range Status   Specimen Description   Final    URINE, CATHETERIZED Performed at [facility], [address], [place], [place] [address]    Special Requests   Final    NONE Performed at [facility], [address] [place], [place] [address]    Culture 60,000 COLONIES/mL YEAST (A)  Final   Report Status 09/08/2023 FINAL  Final     Time coordinating discharge: 35 minutes  SIGNED:   Erick Blinks, DO [facility] 09/09/2023, 12:00 PM  If 7PM-7AM, please contact night-coverage [email or web address]

## 2023-09-11 DIAGNOSIS — R5381 Other malaise: Secondary | ICD-10-CM | POA: Diagnosis not present

## 2023-09-11 DIAGNOSIS — R531 Weakness: Secondary | ICD-10-CM | POA: Diagnosis not present

## 2023-09-11 NOTE — Consult Note (Signed)
 [facility] Liaison Note  09/11/2023  Amanda Armstrong 08/01/1945 [id number]  Location: RN Hospital Liaison screened the patient remotely at [facility].  Insurance: [employer]   Amanda Armstrong is a 78 y.o. female who is a Primary Care Patient of [name], MD-[facility]. The patient was screened for 30 day readmission hospitalization with noted high risk score for unplanned readmission risk with 3 IP/2 ED in 6 months.  The patient was assessed for potential Care Management service needs for post hospital transition for care coordination. Review of patient's electronic medical record reveals patient was admitted for Generalized Weakness. Pt recommended for SNF level of care. Facility will continue to address pt's ongoing needs.   [facility] Care Management/Population Health does not replace or interfere with any arrangements made by the Inpatient Transition of Care team.   For questions contact:   Elliot Cousin, RN, [facility] Liaison [place]   [facility], Population Health Office Hours MTWF  8:00 am-6:00 pm Direct Dial: [phone] mobile [phone] [Office toll free line] Office Hours are M-F 8:30 - 5 pm [email or web address]

## 2023-09-13 DIAGNOSIS — R1312 Dysphagia, oropharyngeal phase: Secondary | ICD-10-CM | POA: Diagnosis not present

## 2023-09-13 DIAGNOSIS — E119 Type 2 diabetes mellitus without complications: Secondary | ICD-10-CM | POA: Diagnosis not present

## 2023-09-13 DIAGNOSIS — E039 Hypothyroidism, unspecified: Secondary | ICD-10-CM | POA: Diagnosis not present

## 2023-09-13 DIAGNOSIS — R278 Other lack of coordination: Secondary | ICD-10-CM | POA: Diagnosis not present

## 2023-09-13 DIAGNOSIS — N3000 Acute cystitis without hematuria: Secondary | ICD-10-CM | POA: Diagnosis not present

## 2023-09-13 DIAGNOSIS — J449 Chronic obstructive pulmonary disease, unspecified: Secondary | ICD-10-CM | POA: Diagnosis not present

## 2023-09-13 DIAGNOSIS — R5381 Other malaise: Secondary | ICD-10-CM | POA: Diagnosis not present

## 2023-09-13 DIAGNOSIS — M6281 Muscle weakness (generalized): Secondary | ICD-10-CM | POA: Diagnosis not present

## 2023-09-18 DIAGNOSIS — G319 Degenerative disease of nervous system, unspecified: Secondary | ICD-10-CM | POA: Diagnosis not present

## 2023-09-18 DIAGNOSIS — R5381 Other malaise: Secondary | ICD-10-CM | POA: Diagnosis not present

## 2023-09-20 DIAGNOSIS — E119 Type 2 diabetes mellitus without complications: Secondary | ICD-10-CM | POA: Diagnosis not present

## 2023-09-20 DIAGNOSIS — M6281 Muscle weakness (generalized): Secondary | ICD-10-CM | POA: Diagnosis not present

## 2023-09-20 DIAGNOSIS — E039 Hypothyroidism, unspecified: Secondary | ICD-10-CM | POA: Diagnosis not present

## 2023-09-20 DIAGNOSIS — R5381 Other malaise: Secondary | ICD-10-CM | POA: Diagnosis not present

## 2023-09-20 DIAGNOSIS — J449 Chronic obstructive pulmonary disease, unspecified: Secondary | ICD-10-CM | POA: Diagnosis not present

## 2023-09-21 DIAGNOSIS — E039 Hypothyroidism, unspecified: Secondary | ICD-10-CM | POA: Diagnosis not present

## 2023-09-29 DIAGNOSIS — J449 Chronic obstructive pulmonary disease, unspecified: Secondary | ICD-10-CM | POA: Diagnosis not present

## 2023-09-29 DIAGNOSIS — J9601 Acute respiratory failure with hypoxia: Secondary | ICD-10-CM | POA: Diagnosis not present

## 2023-09-29 DIAGNOSIS — U071 COVID-19: Secondary | ICD-10-CM | POA: Diagnosis not present

## 2023-10-02 DIAGNOSIS — R278 Other lack of coordination: Secondary | ICD-10-CM | POA: Diagnosis not present

## 2023-10-02 DIAGNOSIS — M6281 Muscle weakness (generalized): Secondary | ICD-10-CM | POA: Diagnosis not present

## 2023-10-02 DIAGNOSIS — R2689 Other abnormalities of gait and mobility: Secondary | ICD-10-CM | POA: Diagnosis not present

## 2023-10-03 DIAGNOSIS — R278 Other lack of coordination: Secondary | ICD-10-CM | POA: Diagnosis not present

## 2023-10-03 DIAGNOSIS — M6281 Muscle weakness (generalized): Secondary | ICD-10-CM | POA: Diagnosis not present

## 2023-10-03 DIAGNOSIS — R2689 Other abnormalities of gait and mobility: Secondary | ICD-10-CM | POA: Diagnosis not present

## 2023-10-04 DIAGNOSIS — R2689 Other abnormalities of gait and mobility: Secondary | ICD-10-CM | POA: Diagnosis not present

## 2023-10-04 DIAGNOSIS — M6281 Muscle weakness (generalized): Secondary | ICD-10-CM | POA: Diagnosis not present

## 2023-10-04 DIAGNOSIS — R278 Other lack of coordination: Secondary | ICD-10-CM | POA: Diagnosis not present

## 2023-10-05 DIAGNOSIS — R278 Other lack of coordination: Secondary | ICD-10-CM | POA: Diagnosis not present

## 2023-10-05 DIAGNOSIS — R2689 Other abnormalities of gait and mobility: Secondary | ICD-10-CM | POA: Diagnosis not present

## 2023-10-05 DIAGNOSIS — M6281 Muscle weakness (generalized): Secondary | ICD-10-CM | POA: Diagnosis not present

## 2023-10-06 DIAGNOSIS — M6281 Muscle weakness (generalized): Secondary | ICD-10-CM | POA: Diagnosis not present

## 2023-10-06 DIAGNOSIS — R2689 Other abnormalities of gait and mobility: Secondary | ICD-10-CM | POA: Diagnosis not present

## 2023-10-06 DIAGNOSIS — R278 Other lack of coordination: Secondary | ICD-10-CM | POA: Diagnosis not present

## 2023-10-09 DIAGNOSIS — M6281 Muscle weakness (generalized): Secondary | ICD-10-CM | POA: Diagnosis not present

## 2023-10-09 DIAGNOSIS — R2689 Other abnormalities of gait and mobility: Secondary | ICD-10-CM | POA: Diagnosis not present

## 2023-10-09 DIAGNOSIS — R278 Other lack of coordination: Secondary | ICD-10-CM | POA: Diagnosis not present

## 2023-10-10 DIAGNOSIS — R2689 Other abnormalities of gait and mobility: Secondary | ICD-10-CM | POA: Diagnosis not present

## 2023-10-10 DIAGNOSIS — M6281 Muscle weakness (generalized): Secondary | ICD-10-CM | POA: Diagnosis not present

## 2023-10-10 DIAGNOSIS — R278 Other lack of coordination: Secondary | ICD-10-CM | POA: Diagnosis not present

## 2023-10-11 DIAGNOSIS — R2689 Other abnormalities of gait and mobility: Secondary | ICD-10-CM | POA: Diagnosis not present

## 2023-10-11 DIAGNOSIS — M6281 Muscle weakness (generalized): Secondary | ICD-10-CM | POA: Diagnosis not present

## 2023-10-11 DIAGNOSIS — R278 Other lack of coordination: Secondary | ICD-10-CM | POA: Diagnosis not present

## 2023-10-12 DIAGNOSIS — R278 Other lack of coordination: Secondary | ICD-10-CM | POA: Diagnosis not present

## 2023-10-12 DIAGNOSIS — R2689 Other abnormalities of gait and mobility: Secondary | ICD-10-CM | POA: Diagnosis not present

## 2023-10-12 DIAGNOSIS — M6281 Muscle weakness (generalized): Secondary | ICD-10-CM | POA: Diagnosis not present

## 2023-10-13 DIAGNOSIS — R2689 Other abnormalities of gait and mobility: Secondary | ICD-10-CM | POA: Diagnosis not present

## 2023-10-13 DIAGNOSIS — M6281 Muscle weakness (generalized): Secondary | ICD-10-CM | POA: Diagnosis not present

## 2023-10-13 DIAGNOSIS — R278 Other lack of coordination: Secondary | ICD-10-CM | POA: Diagnosis not present

## 2023-10-16 DIAGNOSIS — M6281 Muscle weakness (generalized): Secondary | ICD-10-CM | POA: Diagnosis not present

## 2023-10-16 DIAGNOSIS — R278 Other lack of coordination: Secondary | ICD-10-CM | POA: Diagnosis not present

## 2023-10-16 DIAGNOSIS — R2689 Other abnormalities of gait and mobility: Secondary | ICD-10-CM | POA: Diagnosis not present

## 2023-10-17 DIAGNOSIS — M6281 Muscle weakness (generalized): Secondary | ICD-10-CM | POA: Diagnosis not present

## 2023-10-17 DIAGNOSIS — R2689 Other abnormalities of gait and mobility: Secondary | ICD-10-CM | POA: Diagnosis not present

## 2023-10-17 DIAGNOSIS — R278 Other lack of coordination: Secondary | ICD-10-CM | POA: Diagnosis not present

## 2023-10-18 DIAGNOSIS — E119 Type 2 diabetes mellitus without complications: Secondary | ICD-10-CM | POA: Diagnosis not present

## 2023-10-18 DIAGNOSIS — R5381 Other malaise: Secondary | ICD-10-CM | POA: Diagnosis not present

## 2023-10-18 DIAGNOSIS — M6281 Muscle weakness (generalized): Secondary | ICD-10-CM | POA: Diagnosis not present

## 2023-10-18 DIAGNOSIS — R300 Dysuria: Secondary | ICD-10-CM | POA: Diagnosis not present

## 2023-10-18 DIAGNOSIS — R278 Other lack of coordination: Secondary | ICD-10-CM | POA: Diagnosis not present

## 2023-10-18 DIAGNOSIS — R2689 Other abnormalities of gait and mobility: Secondary | ICD-10-CM | POA: Diagnosis not present

## 2023-10-19 DIAGNOSIS — M6281 Muscle weakness (generalized): Secondary | ICD-10-CM | POA: Diagnosis not present

## 2023-10-19 DIAGNOSIS — G319 Degenerative disease of nervous system, unspecified: Secondary | ICD-10-CM | POA: Diagnosis not present

## 2023-10-19 DIAGNOSIS — R278 Other lack of coordination: Secondary | ICD-10-CM | POA: Diagnosis not present

## 2023-10-19 DIAGNOSIS — R2689 Other abnormalities of gait and mobility: Secondary | ICD-10-CM | POA: Diagnosis not present

## 2023-10-19 DIAGNOSIS — R5381 Other malaise: Secondary | ICD-10-CM | POA: Diagnosis not present

## 2023-10-20 DIAGNOSIS — M6281 Muscle weakness (generalized): Secondary | ICD-10-CM | POA: Diagnosis not present

## 2023-10-20 DIAGNOSIS — R2689 Other abnormalities of gait and mobility: Secondary | ICD-10-CM | POA: Diagnosis not present

## 2023-10-20 DIAGNOSIS — N390 Urinary tract infection, site not specified: Secondary | ICD-10-CM | POA: Diagnosis not present

## 2023-10-20 DIAGNOSIS — R278 Other lack of coordination: Secondary | ICD-10-CM | POA: Diagnosis not present

## 2023-10-21 DIAGNOSIS — E039 Hypothyroidism, unspecified: Secondary | ICD-10-CM | POA: Diagnosis not present

## 2023-10-23 DIAGNOSIS — R278 Other lack of coordination: Secondary | ICD-10-CM | POA: Diagnosis not present

## 2023-10-23 DIAGNOSIS — M6281 Muscle weakness (generalized): Secondary | ICD-10-CM | POA: Diagnosis not present

## 2023-10-23 DIAGNOSIS — R2689 Other abnormalities of gait and mobility: Secondary | ICD-10-CM | POA: Diagnosis not present

## 2023-10-24 DIAGNOSIS — E119 Type 2 diabetes mellitus without complications: Secondary | ICD-10-CM | POA: Diagnosis not present

## 2023-10-24 DIAGNOSIS — E039 Hypothyroidism, unspecified: Secondary | ICD-10-CM | POA: Diagnosis not present

## 2023-10-24 DIAGNOSIS — N390 Urinary tract infection, site not specified: Secondary | ICD-10-CM | POA: Diagnosis not present

## 2023-10-24 DIAGNOSIS — M6281 Muscle weakness (generalized): Secondary | ICD-10-CM | POA: Diagnosis not present

## 2023-10-24 DIAGNOSIS — R278 Other lack of coordination: Secondary | ICD-10-CM | POA: Diagnosis not present

## 2023-10-24 DIAGNOSIS — R5381 Other malaise: Secondary | ICD-10-CM | POA: Diagnosis not present

## 2023-10-24 DIAGNOSIS — R300 Dysuria: Secondary | ICD-10-CM | POA: Diagnosis not present

## 2023-10-24 DIAGNOSIS — R2689 Other abnormalities of gait and mobility: Secondary | ICD-10-CM | POA: Diagnosis not present

## 2023-10-25 DIAGNOSIS — M6281 Muscle weakness (generalized): Secondary | ICD-10-CM | POA: Diagnosis not present

## 2023-10-25 DIAGNOSIS — R278 Other lack of coordination: Secondary | ICD-10-CM | POA: Diagnosis not present

## 2023-10-25 DIAGNOSIS — R2689 Other abnormalities of gait and mobility: Secondary | ICD-10-CM | POA: Diagnosis not present

## 2023-10-26 DIAGNOSIS — R2689 Other abnormalities of gait and mobility: Secondary | ICD-10-CM | POA: Diagnosis not present

## 2023-10-26 DIAGNOSIS — R278 Other lack of coordination: Secondary | ICD-10-CM | POA: Diagnosis not present

## 2023-10-26 DIAGNOSIS — M6281 Muscle weakness (generalized): Secondary | ICD-10-CM | POA: Diagnosis not present

## 2023-10-27 DIAGNOSIS — M6281 Muscle weakness (generalized): Secondary | ICD-10-CM | POA: Diagnosis not present

## 2023-10-27 DIAGNOSIS — R2689 Other abnormalities of gait and mobility: Secondary | ICD-10-CM | POA: Diagnosis not present

## 2023-10-27 DIAGNOSIS — R278 Other lack of coordination: Secondary | ICD-10-CM | POA: Diagnosis not present

## 2023-10-28 DIAGNOSIS — B351 Tinea unguium: Secondary | ICD-10-CM | POA: Diagnosis not present

## 2023-10-28 DIAGNOSIS — E1159 Type 2 diabetes mellitus with other circulatory complications: Secondary | ICD-10-CM | POA: Diagnosis not present

## 2023-10-30 DIAGNOSIS — R278 Other lack of coordination: Secondary | ICD-10-CM | POA: Diagnosis not present

## 2023-10-30 DIAGNOSIS — M6281 Muscle weakness (generalized): Secondary | ICD-10-CM | POA: Diagnosis not present

## 2023-10-30 DIAGNOSIS — R2689 Other abnormalities of gait and mobility: Secondary | ICD-10-CM | POA: Diagnosis not present

## 2023-10-31 DIAGNOSIS — E119 Type 2 diabetes mellitus without complications: Secondary | ICD-10-CM | POA: Diagnosis not present

## 2023-10-31 DIAGNOSIS — R2689 Other abnormalities of gait and mobility: Secondary | ICD-10-CM | POA: Diagnosis not present

## 2023-10-31 DIAGNOSIS — M6281 Muscle weakness (generalized): Secondary | ICD-10-CM | POA: Diagnosis not present

## 2023-10-31 DIAGNOSIS — R278 Other lack of coordination: Secondary | ICD-10-CM | POA: Diagnosis not present

## 2023-10-31 DIAGNOSIS — R5381 Other malaise: Secondary | ICD-10-CM | POA: Diagnosis not present

## 2023-11-06 DIAGNOSIS — E039 Hypothyroidism, unspecified: Secondary | ICD-10-CM | POA: Diagnosis not present

## 2023-11-06 DIAGNOSIS — M810 Age-related osteoporosis without current pathological fracture: Secondary | ICD-10-CM | POA: Diagnosis not present

## 2023-11-14 DIAGNOSIS — E119 Type 2 diabetes mellitus without complications: Secondary | ICD-10-CM | POA: Diagnosis not present

## 2023-11-14 DIAGNOSIS — R059 Cough, unspecified: Secondary | ICD-10-CM | POA: Diagnosis not present

## 2023-11-14 DIAGNOSIS — R5381 Other malaise: Secondary | ICD-10-CM | POA: Diagnosis not present

## 2023-11-15 ENCOUNTER — Ambulatory Visit: Payer: 59 | Admitting: Urology

## 2023-11-15 VITALS — BP 111/71 | HR 85

## 2023-11-15 DIAGNOSIS — R300 Dysuria: Secondary | ICD-10-CM

## 2023-11-15 DIAGNOSIS — R350 Frequency of micturition: Secondary | ICD-10-CM | POA: Diagnosis not present

## 2023-11-15 DIAGNOSIS — N390 Urinary tract infection, site not specified: Secondary | ICD-10-CM | POA: Diagnosis not present

## 2023-11-15 DIAGNOSIS — N3020 Other chronic cystitis without hematuria: Secondary | ICD-10-CM | POA: Diagnosis not present

## 2023-11-15 DIAGNOSIS — Z87440 Personal history of urinary (tract) infections: Secondary | ICD-10-CM

## 2023-11-15 DIAGNOSIS — E119 Type 2 diabetes mellitus without complications: Secondary | ICD-10-CM | POA: Diagnosis not present

## 2023-11-15 DIAGNOSIS — R059 Cough, unspecified: Secondary | ICD-10-CM | POA: Diagnosis not present

## 2023-11-15 DIAGNOSIS — R3915 Urgency of urination: Secondary | ICD-10-CM

## 2023-11-15 DIAGNOSIS — N3289 Other specified disorders of bladder: Secondary | ICD-10-CM

## 2023-11-15 LAB — URINALYSIS, ROUTINE W REFLEX MICROSCOPIC
Bilirubin, UA: NEGATIVE
Glucose, UA: NEGATIVE
Ketones, UA: NEGATIVE
Nitrite, UA: POSITIVE — AB
Specific Gravity, UA: 1.02 (ref 1.005–1.030)
Urobilinogen, Ur: 0.2 mg/dL (ref 0.2–1.0)
pH, UA: 6 (ref 5.0–7.5)

## 2023-11-15 LAB — MICROSCOPIC EXAMINATION
RBC, Urine: >30 /HPF — AB (ref 0–2)
WBC, UA: >30 /HPF — AB (ref 0–5)

## 2023-11-15 MED ORDER — NITROFURANTOIN MONOHYD MACRO 100 MG PO CAPS
100.0000 mg | ORAL_CAPSULE | Freq: Two times a day (BID) | ORAL | 0 refills | Status: DC
Start: 2023-11-15 — End: 2023-11-15

## 2023-11-15 MED ORDER — CIPROFLOXACIN HCL 500 MG PO TABS: 500.0000 mg | ORAL_TABLET | Freq: Once | ORAL | Status: DC

## 2023-11-15 MED ORDER — NITROFURANTOIN MONOHYD MACRO 100 MG PO CAPS: 100.0000 mg | ORAL_CAPSULE | Freq: Two times a day (BID) | ORAL | 0 refills | Status: AC

## 2023-11-15 NOTE — Progress Notes (Signed)
 11/15/2023 11:51 AM   Amanda Armstrong [birth year] [id number]  Referring provider: Kathyleen Parkins, MD [address] [place],  [place] [address]  hematuria   HPI: [name] is a 78yo here for followup for recurrent UTI. She was scheduled for cystoscopy today but her urine is concerning for infection. She has increased urinary frequency urgency and occasional dysuria   PMH: 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    pt stated that she has intermittent sx   Clostridium difficile infection    Daytime somnolence    Frequent falls    Headache(784.0)    SINUS HEADACHE OCCASSIONALLY   Hypertension    Hypothyroidism    Multifactorial gait disorder    Osteoporosis    Right foot drop    Snoring    Stroke (HCC)    Tobacco use    Vertigo    Weakness     Surgical History: Past Surgical History:  Procedure Laterality Date   CATARACT EXTRACTION W/PHACO Right 06/28/2016   Procedure: CATARACT EXTRACTION PHACO AND INTRAOCULAR LENS PLACEMENT (IOC);  Surgeon: Albert Huff, MD;  Location: [facility];  Service: Ophthalmology;  Laterality: Right;  CDE: 11.10   CATARACT EXTRACTION W/PHACO Left 07/26/2016   Procedure: CATARACT EXTRACTION PHACO AND INTRAOCULAR LENS PLACEMENT (IOC);  Surgeon: Albert Huff, MD;  Location: [facility];  Service: Ophthalmology;  Laterality: Left;  CDE: 8.88   CYSTOSCOPY N/A 08/03/2023   Procedure: CYSTOSCOPY;  Surgeon: Marco Severs, MD;  Location: [facility];  Service: Urology;  Laterality: N/A;   JOINT REPLACEMENT  11/08/3010   right total knee   TOTAL KNEE ARTHROPLASTY  11/07/2011   Procedure: TOTAL KNEE ARTHROPLASTY;  Surgeon: Genevie Kerns, MD;  Location: [facility];  Service: Orthopedics;  Laterality: Left;  DR Jinger Mount WANTS 90 MINUTES FOR THIS CASE   TOTAL KNEE ARTHROPLASTY Right 2011   TRANSURETHRAL RESECTION OF BLADDER TUMOR N/A  08/03/2023   Procedure: TRANSURETHRAL RESECTION OF BLADDER TUMOR  (TURBT) WITH GEMCITABINE ;  Surgeon: Marco Severs, MD;  Location: [facility];  Service: Urology;  Laterality: N/A;   TUBAL LIGATION  1982    Home Medications:  Allergies as of 11/15/2023       Reactions   Iron Other (See Comments)   "upset stomach"   Oxycodone Other (See Comments)   "Caused her to feel like she is out of her body"   Sudafed [pseudoephedrine Hcl] Other (See Comments)   "funny feeling"   Chlorhexidine  Gluconate Rash        Medication List        Accurate as of November 15, 2023 11:51 AM. If you have any questions, ask your nurse or doctor.          Accu-Chek Guide test strip Generic drug: glucose blood USE TEST STRIP TO CHECK GLUCOSE TWICE DAILY BEFORE BREAKFAST AND  BEFORE BEDTIME   Accu-Chek Softclix Lancets lancets USE  TO CHECK GLUCOSE TWICE DAILY   acetaminophen  500 MG tablet Commonly known as: TYLENOL  Take 1,000 mg by mouth every 6 (six) hours as needed for mild pain.   alendronate  70 MG tablet Commonly known as: FOSAMAX  Take 70 mg by mouth every Sunday.   ALPRAZolam  1 MG tablet Commonly known as: XANAX  Take 1 tablet (1 mg total) by mouth at bedtime.   aspirin  EC 81 MG tablet  Take 1 tablet (81 mg total) by mouth daily with breakfast.   atorvastatin  20 MG tablet Commonly known as: LIPITOR Take 20 mg by mouth daily at 12 noon.   blood glucose meter kit and supplies Dispense based on patient and insurance preference. Use up to four times daily as directed. (FOR ICD-10 E10.9, E11.9).   donepezil  5 MG tablet Commonly known as: ARICEPT  Take 5 mg by mouth daily.   DULoxetine  30 MG capsule Commonly known as: CYMBALTA  Take 30 mg by mouth daily.   furosemide  20 MG tablet Commonly known as: LASIX  Take 20 mg by mouth daily.   HYDROcodone -acetaminophen  10-325 MG tablet Commonly known as: NORCO Take 1 tablet by mouth every 4 (four) hours as needed for moderate pain (pain  score 4-6).   levothyroxine  75 MCG tablet Commonly known as: SYNTHROID  Take 1 tablet (75 mcg total) by mouth daily before breakfast.   MUSCLE RUB EX Apply 1 Application topically daily as needed (pain).   nicotine  14 mg/24hr patch Commonly known as: NICODERM CQ  - dosed in mg/24 hours Place 1 patch (14 mg total) onto the skin daily.   nitrofurantoin  (macrocrystal-monohydrate) 100 MG capsule Commonly known as: MACROBID  Take 1 capsule (100 mg total) by mouth every 12 (twelve) hours. Started by: Johnie Nailer   nystatin  powder Commonly known as: MYCOSTATIN /NYSTOP  Apply topically 2 (two) times daily.   OXYGEN  Inhale 1.5-2 L/min into the lungs daily as needed (O2 sats below 90 and at bedtime).   Trelegy Ellipta 200-62.5-25 MCG/ACT Aepb Generic drug: Fluticasone -Umeclidin-Vilant Inhale 1 puff into the lungs daily.   Vitamin D3 50 MCG (2000 UT) capsule Take 2,000 Units by mouth daily.        Allergies:  Allergies  Allergen Reactions   Iron Other (See Comments)    "upset stomach"   Oxycodone Other (See Comments)    "Caused her to feel like she is out of her body"   Sudafed [Pseudoephedrine Hcl] Other (See Comments)    "funny feeling"   Chlorhexidine  Gluconate Rash    Family History: Family History  Problem Relation Age of Onset   Heart attack Mother    COPD Father    Arthritis Sister    Heart disease Brother     Social History:  reports that she has been smoking cigarettes. She has a 48 pack-year smoking history. She has never used smokeless tobacco. She reports that she does not drink alcohol and does not use drugs.  ROS: All other review of systems were reviewed and are negative except what is noted above in HPI  Physical Exam: BP 111/71   Pulse 85   Constitutional:  Alert and oriented, No acute distress. HEENT: [place] AT, moist mucus membranes.  Trachea midline, no masses. Cardiovascular: No clubbing, cyanosis, or edema. Respiratory: Normal respiratory  effort, no increased work of breathing. GI: Abdomen is soft, nontender, nondistended, no abdominal masses GU: No CVA tenderness.  Lymph: No cervical or inguinal lymphadenopathy. Skin: No rashes, bruises or suspicious lesions. Neurologic: Grossly intact, no focal deficits, moving all 4 extremities. Psychiatric: Normal mood and affect.  Laboratory Data: Lab Results  Component Value Date   WBC 9.7 09/09/2023   HGB 11.6 (L) 09/09/2023   HCT 35.8 (L) 09/09/2023   MCV 93.7 09/09/2023   PLT 193 09/09/2023    Lab Results  Component Value Date   CREATININE 1.16 (H) 09/09/2023    No results found for: "PSA"  No results found for: "TESTOSTERONE"  Lab Results  Component Value Date  HGBA1C 5.8 (A) 02/27/2023    Urinalysis    Component Value Date/Time   COLORURINE AMBER (A) 09/06/2023 1519   APPEARANCEUR TURBID (A) 09/06/2023 1519   LABSPEC 1.010 09/06/2023 1519   PHURINE 5.0 09/06/2023 1519   GLUCOSEU NEGATIVE 09/06/2023 1519   HGBUR LARGE (A) 09/06/2023 1519   BILIRUBINUR NEGATIVE 09/06/2023 1519   KETONESUR NEGATIVE 09/06/2023 1519   PROTEINUR 30 (A) 09/06/2023 1519   UROBILINOGEN 0.2 08/27/2013 1130   NITRITE NEGATIVE 09/06/2023 1519   LEUKOCYTESUR LARGE (A) 09/06/2023 1519    Lab Results  Component Value Date   BACTERIA MANY (A) 09/06/2023    Pertinent Imaging:  No results found for this or any previous visit.  No results found for this or any previous visit.  No results found for this or any previous visit.  No results found for this or any previous visit.  No results found for this or any previous visit.  No results found for this or any previous visit.  No results found for this or any previous visit.  No results found for this or any previous visit.   Assessment & Plan:    1. Bladder mass (Primary) Reschedule cystoscopy - Urinalysis, Routine w reflex microscopic - Urine Culture  2. Chronic cystitis Urine for culture Macrobid  100mg  BID for  7 days   No follow-ups on file.  Johnie Nailer, MD  [facility] [place]

## 2023-11-20 LAB — URINE CULTURE

## 2023-11-21 ENCOUNTER — Encounter: Payer: Self-pay | Admitting: Urology

## 2023-11-21 NOTE — Patient Instructions (Signed)

## 2023-12-14 ENCOUNTER — Telehealth: Payer: Self-pay | Admitting: Urology

## 2023-12-14 NOTE — Telephone Encounter (Signed)
 Has Cysto appt 6/4 and has UTI, facility is giving antibiotic and wants to know if she needs to keep appt

## 2023-12-14 NOTE — Telephone Encounter (Signed)
 Daughter made aware that since her mother is being treated with antibiotics several days prior to her procedure she will be fine to keep her appointment. Urine will be checked again prior to proceeding with cysto. Daughter voiced understanding.

## 2023-12-19 ENCOUNTER — Telehealth: Payer: Self-pay

## 2023-12-19 DIAGNOSIS — N390 Urinary tract infection, site not specified: Secondary | ICD-10-CM

## 2023-12-19 MED ORDER — SULFAMETHOXAZOLE-TRIMETHOPRIM 800-160 MG PO TABS: 1.0000 | ORAL_TABLET | Freq: Two times a day (BID) | ORAL | 0 refills | Status: AC

## 2023-12-19 NOTE — Telephone Encounter (Signed)
 Attempted to call Rehab nurse of pt to inform the, of pt new abx Rx and appointment change

## 2023-12-19 NOTE — Telephone Encounter (Signed)
 Called pt daughter to make her aware of positive infection and abx rx sent to [facility] pt daughter voiced understanding as well as asked that [employer] be notified of rx and any other health information pt advise a call would be made to rehab and a DPR would need to be signed by pt in order for [facility] to receive all pt health information pt daughter voiced her understanding pt also asked if pt was to keep her scheduled appt daughter advised that a message to MD would be sent for advisement and pt daughter as well as [facility] would be contacted based on appt change per MD. Pt cysto appt rescheduled to 01/29/2024 at 1:10 PM notified pt daughter and [facility]

## 2023-12-19 NOTE — Telephone Encounter (Signed)
 Called daughter to let her know we can keep schedule appt for 06/04 and pt daughter stated she already called [facility] rehab and told them of the cancellation per pt daughter keep rescheduled appt on 06/14 rescheduled appt kept

## 2023-12-20 ENCOUNTER — Other Ambulatory Visit: Admitting: Urology

## 2024-01-29 ENCOUNTER — Other Ambulatory Visit: Admitting: Urology

## 2024-03-20 ENCOUNTER — Other Ambulatory Visit: Admitting: Urology

## 2024-03-20 DIAGNOSIS — N3289 Other specified disorders of bladder: Secondary | ICD-10-CM

## 2024-05-29 ENCOUNTER — Ambulatory Visit (INDEPENDENT_AMBULATORY_CARE_PROVIDER_SITE_OTHER): Admitting: Internal Medicine

## 2024-05-29 ENCOUNTER — Encounter: Payer: Self-pay | Admitting: Internal Medicine

## 2024-05-29 VITALS — BP 112/73 | HR 81 | Ht <= 58 in

## 2024-05-29 DIAGNOSIS — J9611 Chronic respiratory failure with hypoxia: Secondary | ICD-10-CM

## 2024-05-29 DIAGNOSIS — J449 Chronic obstructive pulmonary disease, unspecified: Secondary | ICD-10-CM | POA: Diagnosis not present

## 2024-05-29 NOTE — Assessment & Plan Note (Addendum)
 Quit smoking Feb 2025 when admit for bladder surgery> SNF  - 05/21/2021  After extensive coaching inhaler device,  effectiveness =    75% with dpi  - 05/29/2024 has taken herself off all resp meds x for prn 02   Doing fine with very activity on no medications including 02 (see sep a/p re 02)  No need for any bronchodilators if not having aecopd or limiting doe but p discharge needs to return to regroup with all meds in hand using a trust but verify approach to confirm accurate Medication  Reconciliation The principal here is that until we are certain that the  patients are doing what we've asked, it makes no sense to ask them to do more.     Total time for H and P, chart review, counseling, reviewing hfa/ neb/pulse ox  device(s) and generating customized AVS unique to this office visit / same day charting = 25 min

## 2024-05-29 NOTE — Assessment & Plan Note (Addendum)
 Placed on 19 Jul 2020 p covid  - sats RA at rst = 96% 05/21/2021 so rec titrate > 90% with ex   >>> 05/29/2024 again rec titrate 02 for PT to keep it well above 90% if needed

## 2024-05-29 NOTE — Progress Notes (Signed)
 Amanda Armstrong, female    DOB: 1946-05-24,   MRN: [id number]   Brief patient profile:  78 yowf quit smoking feb 2025  referred to pulmonary clinic in [place]  05/21/2021 by Dr  Bertell  with baseline able to get around the house on 4 pronged walker limited by balance / strength s/p cva's     History of Present Illness  05/21/2021  Pulmonary/ 1st office eval/ [name] / [place] [facility]  Chief Complaint  Patient presents with   Consult    Pt saw dr. [name]. Dx with COPD. Has not seen pulmonary Dr since. COVID January 2022 since shes been on oxygen . 1.5L O2  cont.    Dyspnea:  PT coming out once a week / room to room limited / wearing 02 after ex Cough: congested cough / swallows mucus mostly  but what she does expectorate is clear simmie is am  Sleep: bed is flat with 2 pillows ok at night  SABA use: just anoro 02: 1.5 hs  and all day  Rec Anoro one click each am  - two good drags 02  1.5 lpm at bedtime and during the day the goal is to keep the 0xygen saturation level above 90% at all times - you may need more 02 when you walk and a lot less when you sit to achieve that level. The key is to stop smoking completely before smoking completely stops you! Please schedule a follow up visit in 12 months but call sooner if needed     05/24/2022  f/u ov/[place] office/Kameelah Minish re: GOLD ? /group B copd  maint on Anoro  / actively smokes Chief Complaint  Patient presents with   Follow-up    Breathing doing well   Dyspnea:  limited by balance/strength >  sob  Cough: none now Sleeping: flat bed, lots of pillows  SABA use: doesn't have one (says never did )  02: 1.5 hs and prn daytime  Covid status: vax all but newest  Rec The key is to stop smoking completely before smoking completely stops you! No change in medications    06/22/2023  f/u ov/[place] office/[name] re: GOLD ? Group E maint on trelegy  still smoker  Chief Complaint  Patient presents with   COPD   Shortness of  Breath   Dyspnea:  balance and strength  Cough: smoker's rattle worse in am/ nothing purulent  Sleeping: hosp bed about    resp cc  SABA use: 30 degrees  02: 1.5 h hs and prn  Rec You are cleared for surgery for your bladder  No change in medications The key is to stop smoking completely before smoking completely stops you!    05/29/2024  Yearly ov/[place] office/[name] re: COPD ? gold maint on no rx/ no longer smoker Chief Complaint  Patient presents with   COPD    Shob - non productive coughing sometimes    Dyspnea:  up and down [address] 2 front wheeled walker can make it to nurses station with sats dropping to 88% unless on 02  Cough: none /smoker's rattle is gone Sleeping: 30 degrees hosp bed    resp cc  SABA use: none  02: with PT  prn /  1 or 2 lpm at hs     No obvious day to day or daytime variability or assoc excess/ purulent sputum or mucus plugs or hemoptysis or cp or chest tightness, subjective wheeze or overt sinus or hb symptoms.    Also denies  any obvious fluctuation of symptoms with weather or environmental changes or other aggravating or alleviating factors except as outlined above   No unusual exposure hx or h/o childhood pna/ asthma or knowledge of premature birth.  Current Allergies, Complete Past Medical History, Past Surgical History, Family History, and Social History were reviewed in [employer] record.  ROS  The following are not active complaints unless bolded Hoarseness, sore throat, dysphagia, dental problems, itching, sneezing,  nasal congestion or discharge of excess mucus or purulent secretions, ear ache,   fever, chills, sweats, unintended wt loss or wt gain, classically pleuritic or exertional cp,  orthopnea pnd or arm/hand swelling  or leg swelling, presyncope, palpitations, abdominal pain, anorexia, nausea, vomiting, diarrhea  or change in bowel habits or change in bladder habits, change in stools or change in urine, dysuria,  hematuria,  rash, arthralgias, visual complaints, headache, numbness, weakness or ataxia or problems with walking or coordination,  change in mood or  memory.        Current Meds  Medication Sig   ACCU-CHEK GUIDE test strip USE TEST STRIP TO CHECK GLUCOSE TWICE DAILY BEFORE BREAKFAST AND  BEFORE BEDTIME   Accu-Chek Softclix Lancets lancets USE  TO CHECK GLUCOSE TWICE DAILY   acetaminophen  (TYLENOL ) 500 MG tablet Take 1,000 mg by mouth every 6 (six) hours as needed for mild pain.   alendronate  (FOSAMAX ) 70 MG tablet Take 70 mg by mouth every Sunday.   ALPRAZolam  (XANAX ) 1 MG tablet Take 1 tablet (1 mg total) by mouth at bedtime.   aspirin  EC 81 MG tablet Take 1 tablet (81 mg total) by mouth daily with breakfast.   atorvastatin  (LIPITOR) 20 MG tablet Take 20 mg by mouth daily at 12 noon.   blood glucose meter kit and supplies Dispense based on patient and insurance preference. Use up to four times daily as directed. (FOR ICD-10 E10.9, E11.9).   Cholecalciferol  (VITAMIN D3) 50 MCG (2000 UT) capsule Take 2,000 Units by mouth daily.   donepezil  (ARICEPT ) 5 MG tablet Take 5 mg by mouth daily.   DULoxetine  (CYMBALTA ) 30 MG capsule Take 30 mg by mouth daily.   furosemide  (LASIX ) 20 MG tablet Take 20 mg by mouth daily.   HYDROcodone -acetaminophen  (NORCO) 10-325 MG tablet Take 1 tablet by mouth every 4 (four) hours as needed for moderate pain (pain score 4-6).   levothyroxine  (SYNTHROID ) 75 MCG tablet Take 1 tablet (75 mcg total) by mouth daily before breakfast.   loperamide (IMODIUM A-D) 2 MG tablet Take 2 mg by mouth 4 (four) times daily as needed for diarrhea or loose stools.   OXYGEN  Inhale 1.5-2 L/min into the lungs daily as needed (O2 sats below 90 and at bedtime).           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    pt stated that she has intermittent sx    Clostridium difficile infection    Daytime somnolence    Frequent falls    Headache(784.0)    SINUS HEADACHE OCCASSIONALLY   Hypertension    Hypothyroidism    Multifactorial gait disorder    Osteoporosis    Right foot drop    Snoring    Stroke (HCC)    Tobacco use    Vertigo    Weakness  Objective:    Wts  05/29/2024       unable to   06/22/2023        did not weigh   05/24/22 163 lb (73.9 kg)  03/25/21 163 lb 12.8 oz (74.3 kg)  08/31/20 151 lb 9.6 oz (68.8 kg)       Vital signs reviewed  05/29/2024  - Note at rest 02 sats  92% on RA   General appearance:    w/c bound frail  elderly wf nad /minimally congested cough lets her daughter answer most of the questions but perks up when talking about food  Voluntary cough variably quite weak and ineffective   HEENT : Oropharynx  clear      NECK :  without  apparent JVD/ palpable Nodes/TM    LUNGS: no acc muscle use,  Mild barrel  contour chest wall with bilateral  Distant bs s audible wheeze and  without cough on insp or exp maneuvers  and mild  Hyperresonant  to  percussion bilaterally     CV:  RRR  no s3 or murmur or increase in P2, and no edema   ABD:  soft and nontender   MS:   ext warm without deformities Or obvious joint restrictions  calf tenderness, cyanosis or clubbing     SKIN: warm and dry without lesions    NEURO:   appears a bit slowed with responses/ no obvious motor  deficits     Assessment     Assessment & Plan Chronic respiratory failure with hypoxia (HCC) Placed on 19 Jul 2020 p covid  - sats RA at rst = 96% 05/21/2021 so rec titrate > 90% with ex   >>> 05/29/2024 again rec titrate 02 for PT to keep it well above 90% if needed   COPD GOLD ? / group B still smoking  Quit smoking Feb 2025 when admit for bladder surgery> SNF  - 05/21/2021  After extensive coaching inhaler device,  effectiveness =    75% with dpi  - 05/29/2024 has taken herself off all resp meds x for prn 02   Doing fine  with very activity on no medications including 02 (see sep a/p re 02)  No need for any bronchodilators if not having aecopd or limiting doe but p discharge needs to return to regroup with all meds in hand using a trust but verify approach to confirm accurate Medication  Reconciliation The principal here is that until we are certain that the  patients are doing what we've asked, it makes no sense to ask them to do more.     Total time for H and P, chart review, counseling, reviewing hfa/ neb/pulse ox  device(s) and generating customized AVS unique to this office visit / same day charting = 25 min          AVS  Patient Instructions  Plan on seeing me about 2 weeks after discharge from assisted living and bring all medications with you to verify exactly what you are taking    Kerstie Agent, MD 05/29/2024

## 2024-05-29 NOTE — Patient Instructions (Addendum)
 Plan on seeing me about 2 weeks after discharge from assisted living and bring all medications with you to verify exactly what you are taking
